# Patient Record
Sex: Female | Born: 1960
Health system: Southern US, Community
[De-identification: ages and names within clinical notes are randomized; demographics above are authoritative.]

## PROBLEM LIST (undated history)

## (undated) DIAGNOSIS — R600 Localized edema: Secondary | ICD-10-CM

## (undated) DIAGNOSIS — M1990 Unspecified osteoarthritis, unspecified site: Secondary | ICD-10-CM

## (undated) DIAGNOSIS — C801 Malignant (primary) neoplasm, unspecified: Secondary | ICD-10-CM

## (undated) DIAGNOSIS — R0609 Other forms of dyspnea: Secondary | ICD-10-CM

## (undated) DIAGNOSIS — D649 Anemia, unspecified: Secondary | ICD-10-CM

## (undated) DIAGNOSIS — L719 Rosacea, unspecified: Secondary | ICD-10-CM

## (undated) DIAGNOSIS — F32A Depression, unspecified: Secondary | ICD-10-CM

## (undated) DIAGNOSIS — U099 Post covid-19 condition, unspecified: Secondary | ICD-10-CM

## (undated) DIAGNOSIS — E2749 Other adrenocortical insufficiency: Secondary | ICD-10-CM

## (undated) DIAGNOSIS — I639 Cerebral infarction, unspecified: Secondary | ICD-10-CM

## (undated) DIAGNOSIS — J189 Pneumonia, unspecified organism: Secondary | ICD-10-CM

## (undated) DIAGNOSIS — E042 Nontoxic multinodular goiter: Secondary | ICD-10-CM

## (undated) DIAGNOSIS — F329 Major depressive disorder, single episode, unspecified: Secondary | ICD-10-CM

## (undated) DIAGNOSIS — Z87898 Personal history of other specified conditions: Secondary | ICD-10-CM

## (undated) DIAGNOSIS — R7303 Prediabetes: Secondary | ICD-10-CM

## (undated) DIAGNOSIS — G5600 Carpal tunnel syndrome, unspecified upper limb: Secondary | ICD-10-CM

## (undated) DIAGNOSIS — R0600 Dyspnea, unspecified: Secondary | ICD-10-CM

## (undated) DIAGNOSIS — R9389 Abnormal findings on diagnostic imaging of other specified body structures: Secondary | ICD-10-CM

## (undated) DIAGNOSIS — J449 Chronic obstructive pulmonary disease, unspecified: Secondary | ICD-10-CM

## (undated) DIAGNOSIS — N950 Postmenopausal bleeding: Secondary | ICD-10-CM

## (undated) DIAGNOSIS — N840 Polyp of corpus uteri: Secondary | ICD-10-CM

## (undated) DIAGNOSIS — M8580 Other specified disorders of bone density and structure, unspecified site: Secondary | ICD-10-CM

## (undated) DIAGNOSIS — L680 Hirsutism: Secondary | ICD-10-CM

## (undated) HISTORY — PX: MOUTH SURGERY: SHX715

## (undated) HISTORY — PX: OTHER SURGICAL HISTORY: SHX169

## (undated) HISTORY — DX: Malignant (primary) neoplasm, unspecified: C80.1

## (undated) HISTORY — PX: TUBAL LIGATION: SHX77

## (undated) HISTORY — PX: TONSILLECTOMY: SUR1361

## (undated) HISTORY — DX: Cerebral infarction, unspecified: I63.9

## (undated) HISTORY — PX: FOOT SURGERY: SHX648

## (undated) HISTORY — DX: Other adrenocortical insufficiency: E27.49

## (undated) HISTORY — PX: HERNIA REPAIR: SHX51

## (undated) HISTORY — DX: Other specified disorders of bone density and structure, unspecified site: M85.80

---
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
## 1991-11-03 DIAGNOSIS — Z8673 Personal history of transient ischemic attack (TIA), and cerebral infarction without residual deficits: Secondary | ICD-10-CM

## 1991-11-03 DIAGNOSIS — I639 Cerebral infarction, unspecified: Secondary | ICD-10-CM

## 1991-11-03 HISTORY — DX: Personal history of transient ischemic attack (TIA), and cerebral infarction without residual deficits: Z86.73

## 1991-11-03 HISTORY — DX: Cerebral infarction, unspecified: I63.9

## 1993-11-02 HISTORY — PX: INGUINAL HERNIA REPAIR: SUR1180

## 2013-11-02 DIAGNOSIS — C801 Malignant (primary) neoplasm, unspecified: Secondary | ICD-10-CM

## 2013-11-02 DIAGNOSIS — Z85819 Personal history of malignant neoplasm of unspecified site of lip, oral cavity, and pharynx: Secondary | ICD-10-CM

## 2013-11-02 HISTORY — DX: Personal history of malignant neoplasm of unspecified site of lip, oral cavity, and pharynx: Z85.819

## 2013-11-02 HISTORY — DX: Malignant (primary) neoplasm, unspecified: C80.1

## 2013-11-02 HISTORY — PX: MOUTH SURGERY: SHX715

## 2014-11-02 DIAGNOSIS — Z85828 Personal history of other malignant neoplasm of skin: Secondary | ICD-10-CM

## 2014-11-02 HISTORY — PX: MOHS SURGERY: SUR867

## 2014-11-02 HISTORY — DX: Other specified postprocedural states: Z85.828

## 2015-05-21 LAB — HM MAMMOGRAPHY

## 2015-11-27 ENCOUNTER — Encounter: Payer: Self-pay | Admitting: Pediatrics

## 2015-11-27 ENCOUNTER — Ambulatory Visit (INDEPENDENT_AMBULATORY_CARE_PROVIDER_SITE_OTHER): Admitting: Pediatrics

## 2015-11-27 ENCOUNTER — Other Ambulatory Visit: Payer: Self-pay | Admitting: Pediatrics

## 2015-11-27 VITALS — BP 131/86 | HR 67 | Temp 97.0°F | Ht 65.5 in | Wt 233.8 lb

## 2015-11-27 DIAGNOSIS — F329 Major depressive disorder, single episode, unspecified: Secondary | ICD-10-CM

## 2015-11-27 DIAGNOSIS — E538 Deficiency of other specified B group vitamins: Secondary | ICD-10-CM | POA: Diagnosis not present

## 2015-11-27 DIAGNOSIS — M7989 Other specified soft tissue disorders: Secondary | ICD-10-CM

## 2015-11-27 DIAGNOSIS — L719 Rosacea, unspecified: Secondary | ICD-10-CM

## 2015-11-27 DIAGNOSIS — F32A Depression, unspecified: Secondary | ICD-10-CM

## 2015-11-27 DIAGNOSIS — E041 Nontoxic single thyroid nodule: Secondary | ICD-10-CM | POA: Diagnosis not present

## 2015-11-27 MED ORDER — DOXYCYCLINE HYCLATE 50 MG PO TABS: 25.0000 mg | ORAL_TABLET | Freq: Every day | ORAL | Status: DC

## 2015-11-27 MED ORDER — VITAMIN B-12 1000 MCG PO TABS: 1000.0000 ug | ORAL_TABLET | Freq: Every day | ORAL | Status: DC

## 2015-11-27 NOTE — Telephone Encounter (Signed)
Refills sent

## 2015-11-27 NOTE — Progress Notes (Signed)
Subjective:    Patient ID: Miranda Perkins, female    DOB: Dec 26, 1960, 55 y.o.   MRN: [id number]  CC: f/u multiple med problems and est care  HPI: Miranda Perkins is a 55 y.o. female presenting for New Patient (Initial Visit)  Leg swelling: has had swelling off and on in LE for past two months, L >R. Has not had an ultrasound. Two sisters and an aunt with problems with clots in legs. 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 Karen Chafe did a biopsy in Nov 2015, wanted to do further biopsy vs intervention in Nov 2016. Pt wants referral to endocrine  Rosacea: takes '25mg'$  doxycycline once a day   Depression screen [facility] 2/9 11/27/2015  Decreased Interest 2  Down, Depressed, Hopeless 1  PHQ - 2 Score 3  Altered sleeping 3  Tired, decreased energy 0  Change in appetite 0  Feeling bad or failure about yourself  0  Trouble concentrating 0  Moving slowly or fidgety/restless 0  Suicidal thoughts 0  PHQ-9 Score 6     ROS: All systems negative other than what is in HPI   Past Medical History  Diagnosis Date  . Cancer ([place]) 2016    basal cell, forehead  . 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 Marital Status: Married    Spouse Name: N/A  . Number of Children: N/A  . Years of Education: N/A   Occupational History  . Not on file.   Social History Main Topics  . Smoking status: Former Smoker -- 0.50 packs/day    Types: Cigarettes    Quit date: 11/26/2009  . Smokeless tobacco: Not on file  . Alcohol Use: No  . Drug Use: No  . Sexual Activity: Not on file   Other Topics Concern  . Not on file   Social History Narrative  . No narrative on file    Family History  Problem Relation Age of Onset  . Rheum arthritis Sister   . Clotting disorder Sister   . Clotting disorder Sister   . Clotting disorder Maternal Aunt   . Clotting disorder Maternal Aunt   . Heart Problems Mother      Current Outpatient Prescriptions  Medication Sig Dispense Refill  . Doxycycline Hyclate 50 MG TABS Take 25 mg by mouth daily. 30 tablet 3  . ibandronate (BONIVA) 150 MG tablet     . ibuprofen (ADVIL,MOTRIN) 200 MG tablet Take 200 mg by mouth every 6 (six) hours as needed.    . vitamin B-12 (CYANOCOBALAMIN) 1000 MCG tablet Take 1 tablet (1,000 mcg total) by mouth daily. 30 tablet 1   No current facility-administered medications for this visit.       Objective:    BP 131/86 mmHg  Pulse 67  Temp(Src) 97 F (36.1 C) (Oral)  Ht 5' 5.5" (1.664 m)  Wt 233 lb 12.8 oz (106.051 kg)  BMI 38.30 kg/m2  Wt Readings from Last 3 Encounters:  11/27/15 233 lb 12.8 oz (106.051 kg)     Gen: NAD, alert, cooperative with exam, NCAT EYES: EOMI, no scleral injection or  icterus ENT:  TMs pearly gray b/l, OP without erythema LYMPH: no cervical LAD CV: NRRR, normal S1/S2, no murmur, distal pulses 2+ b/l Resp: CTABL, no wheezes, normal WOB Abd: +BS, soft, NTND. no guarding or organomegaly Ext: No edema, warm Neuro: Alert and oriented, strength equal b/l UE and LE, coordination grossly normal MSK: normal muscle bulk  R calf 50cm circ L calf 48.5     Assessment & Plan:    Miranda Perkins was seen today for f/u multiple med problems.  Diagnoses and all orders for this visit:  Depression Mood is ok, open to counseling, wants to get her grandsons into counseling as well. Not interested in medicine at this time. No thoughts of self harm. Gave list of counselors in the area.  Leg swelling L leg swelling for past couple of months, comes and goes, L calf always bigger at times with calf pain, fam hx of clotting disorder. WIll get duplex US -     CMP14+EGFR -      Thyroid Panel With TSH -     CBC -     US Venous Img Lower Bilateral; Future  Thyroid nodule H/o thyroid nodules, has had biopsy in past, was being followed by endocrine, wants to establish care. -     Thyroid Panel With TSH -     Ambulatory referral to Endocrinology  Vitamin B 12 deficiency Continue medicine -     vitamin B-12 (CYANOCOBALAMIN) 1000 MCG tablet; Take 1 tablet (1,000 mcg total) by mouth daily.  Rosacea Well controlled, continue medicine -     Doxycycline Hyclate 50 MG TABS; Take 25 mg by mouth daily.    Follow up plan: Return in about 4 weeks (around 12/25/2015).  Assunta Found, MD [facility] 11/27/2015, 12:24 PM

## 2015-11-27 NOTE — Telephone Encounter (Signed)
Patient aware rx sent to pharmacy.  

## 2015-11-28 DIAGNOSIS — L719 Rosacea, unspecified: Secondary | ICD-10-CM | POA: Insufficient documentation

## 2015-11-28 DIAGNOSIS — F329 Major depressive disorder, single episode, unspecified: Secondary | ICD-10-CM | POA: Insufficient documentation

## 2015-11-28 DIAGNOSIS — E042 Nontoxic multinodular goiter: Secondary | ICD-10-CM | POA: Insufficient documentation

## 2015-11-28 DIAGNOSIS — E538 Deficiency of other specified B group vitamins: Secondary | ICD-10-CM | POA: Insufficient documentation

## 2015-11-28 DIAGNOSIS — M7989 Other specified soft tissue disorders: Secondary | ICD-10-CM | POA: Insufficient documentation

## 2015-11-28 DIAGNOSIS — F32A Depression, unspecified: Secondary | ICD-10-CM | POA: Insufficient documentation

## 2015-11-28 LAB — CMP14+EGFR
A/G RATIO: 2.2 (ref 1.1–2.5)
ALT: 26 IU/L (ref 0–32)
AST: 15 IU/L (ref 0–40)
Albumin: 4.7 g/dL (ref 3.5–5.5)
Alkaline Phosphatase: 58 IU/L (ref 39–117)
BUN/Creatinine Ratio: 12 (ref 9–23)
BUN: 9 mg/dL (ref 6–24)
Bilirubin Total: 0.6 mg/dL (ref 0.0–1.2)
CALCIUM: 9.3 mg/dL (ref 8.7–10.2)
CO2: 23 mmol/L (ref 18–29)
CREATININE: 0.78 mg/dL (ref 0.57–1.00)
Chloride: 103 mmol/L (ref 96–106)
GFR, EST AFRICAN AMERICAN: 98 mL/min/{1.73_m2} (ref >59)
GFR, EST NON AFRICAN AMERICAN: 85 mL/min/{1.73_m2} (ref >59)
Globulin, Total: 2.1 g/dL (ref 1.5–4.5)
Glucose: 85 mg/dL (ref 65–99)
POTASSIUM: 4.3 mmol/L (ref 3.5–5.2)
Sodium: 143 mmol/L (ref 134–144)
TOTAL PROTEIN: 6.8 g/dL (ref 6.0–8.5)

## 2015-11-28 LAB — CBC
HEMOGLOBIN: 14.1 g/dL (ref 11.1–15.9)
Hematocrit: 42.3 % (ref 34.0–46.6)
MCH: 29.1 pg (ref 26.6–33.0)
MCHC: 33.3 g/dL (ref 31.5–35.7)
MCV: 87 fL (ref 79–97)
PLATELETS: 278 10*3/uL (ref 150–379)
RBC: 4.85 x10E6/uL (ref 3.77–5.28)
RDW: 14.3 % (ref 12.3–15.4)
WBC: 7 10*3/uL (ref 3.4–10.8)

## 2015-11-28 LAB — THYROID PANEL WITH TSH
FREE THYROXINE INDEX: 2.6 (ref 1.2–4.9)
T3 UPTAKE RATIO: 28 % (ref 24–39)
T4 TOTAL: 9.3 ug/dL (ref 4.5–12.0)
TSH: 2.23 u[IU]/mL (ref 0.450–4.500)

## 2015-11-29 ENCOUNTER — Telehealth: Payer: Self-pay | Admitting: Pediatrics

## 2015-11-29 ENCOUNTER — Encounter: Payer: Self-pay | Admitting: *Deleted

## 2015-11-29 ENCOUNTER — Ambulatory Visit (HOSPITAL_COMMUNITY)
Admission: RE | Admit: 2015-11-29 | Discharge: 2015-11-29 | Disposition: A | Discharge status: Home / Self Care | Source: Ambulatory Visit | Attending: Pediatrics | Admitting: Pediatrics

## 2015-11-29 DIAGNOSIS — M7989 Other specified soft tissue disorders: Secondary | ICD-10-CM | POA: Insufficient documentation

## 2015-11-29 DIAGNOSIS — L719 Rosacea, unspecified: Secondary | ICD-10-CM

## 2015-11-30 MED ORDER — DOXYCYCLINE HYCLATE 100 MG PO TABS
ORAL_TABLET | ORAL | Status: DC
Start: 2015-11-30 — End: 2016-06-12

## 2015-11-30 NOTE — Telephone Encounter (Signed)
Sent in new Rx for the doxycycline. If this doesn't work she should ask the pharmacist which brand/formulation of doxycycline is covered by her insurance and let me know and I will send that in.

## 2015-12-02 ENCOUNTER — Telehealth: Payer: Self-pay | Admitting: Pediatrics

## 2015-12-02 NOTE — Telephone Encounter (Signed)
Spoke with pharmacy and they will pay for the 100mg  and are only going to give her eight since she is going to be taking a 1/4 of a pill.

## 2015-12-02 NOTE — Telephone Encounter (Signed)
Yes, that was what I understood the pt said the pharmacy said needed to be prescribed in order for insurance to pay

## 2015-12-26 ENCOUNTER — Encounter: Payer: Self-pay | Admitting: Pediatrics

## 2015-12-26 ENCOUNTER — Ambulatory Visit (INDEPENDENT_AMBULATORY_CARE_PROVIDER_SITE_OTHER): Admitting: Pediatrics

## 2015-12-26 VITALS — BP 126/81 | HR 70 | Temp 98.1°F | Ht 65.5 in | Wt 231.6 lb

## 2015-12-26 DIAGNOSIS — M25461 Effusion, right knee: Secondary | ICD-10-CM

## 2015-12-26 NOTE — Progress Notes (Signed)
Subjective:    Patient ID: Miranda Perkins, female    DOB: 08/27/1961, 55 y.o.   MRN: [id number]  CC: Follow-up R knee pain  HPI: Miranda Perkins is a 55 y.o. female presenting for Follow-up  Knee pain: Ongoing since Oct Had an xray x2 within past 3 months that showed some OA changes,  Had a cortisone shot 2 months ago to knee, got sick following injection, knee got red, lasting about 4 days Did not help with pain Thinks she had an MRI as well that did not explain the knee pain When had cortisone shot, small amount of fluid pulled off but not sent for analysis and that helped slightly with immediate pain/pressure at the time  Now feels like her R knee is more swollen again compared with L, fluid behind her knee and going up lateral side of R thigh as well No fevers Knee is never warm or red No other joints involved     Depression screen [facility] 2/9 12/26/2015 11/27/2015  Decreased Interest 2 2  Down, Depressed, Hopeless 1 1  PHQ - 2 Score 3 3  Altered sleeping 3 3  Tired, decreased energy 0 0  Change in appetite 0 0  Feeling bad or failure about yourself  0 0  Trouble concentrating 0 0  Moving slowly or fidgety/restless 0 0  Suicidal thoughts 0 0  PHQ-9 Score 6 6     Relevant past medical, surgical, family and social history reviewed and updated as indicated. Interim medical history since our last visit reviewed. Allergies and medications reviewed and updated.    ROS: Per HPI unless specifically indicated above  History  Smoking status  . Former Smoker -- 0.50 packs/day  . Types: Cigarettes  . Quit date: 11/26/2009  Smokeless tobacco  . Not on file    Past Medical History Patient Active Problem List   Diagnosis Date Noted  . Depression 11/28/2015  . Leg swelling 11/28/2015  . Thyroid nodule 11/28/2015  . Vitamin B 12 deficiency 11/28/2015  . Rosacea 11/28/2015        Objective:    BP 126/81 mmHg  Pulse 70  Temp(Src) 98.1 F (36.7 C) (Oral)   Ht 5' 5.5" (1.664 m)  Wt 231 lb 9.6 oz (105.053 kg)  BMI 37.94 kg/m2  Wt Readings from Last 3 Encounters:  12/26/15 231 lb 9.6 oz (105.053 kg)  11/27/15 233 lb 12.8 oz (106.051 kg)    Gen: NAD, alert, cooperative with exam, NCAT EYES: EOMI, no scleral injection or icterus ENT:  OP without erythema CV: NRRR, normal S1/S2, no murmur, distal pulses 2+ b/l Resp: CTABL, no wheezes, normal WOB Neuro: Alert and oriented, strength equal b/l UE and LE, coordination grossly normal MSK: Knee: R knee with moderate to large effusion compared with L, no redness or warmth, small fluid collection behind R knee, no obvious bony abnormalities.  Some TTP throughout Non painful patellar compression. Patellar glide without crepitus. Patellar and quadriceps tendons unremarkable. Hamstring and quadriceps strength is normal.       Assessment & Plan:    Miranda Perkins was seen today for follow-up R knee and LE pain, does have effusion, no redness or warmth. Attempted R knee aspiration, dry tap.  Diagnoses and all orders for this visit:  Knee effusion, right -     Ambulatory referral to Orthopedic Surgery -     [place] Extrem Low Right Ltd; Future  PROCEDURE: Verbal consent was obtained from the patient. Risks including infection explained and  contrasted with benefits and alternatives. Patient prepped with betadine. Landmarks identified. First using medial approach, then lateral approach attempted knee aspiration with 22 gauge needle. No return of fluid. Pt tolerated procedure well.  Follow up plan: Return in about 4 weeks (around 01/23/2016).  Assunta Found, MD [facility] 12/26/2015, 10:20 AM

## 2016-01-02 ENCOUNTER — Ambulatory Visit (HOSPITAL_COMMUNITY)
Admission: RE | Admit: 2016-01-02 | Discharge: 2016-01-02 | Disposition: A | Discharge status: Home / Self Care | Source: Ambulatory Visit | Attending: Pediatrics | Admitting: Pediatrics

## 2016-01-02 DIAGNOSIS — M7121 Synovial cyst of popliteal space [Baker], right knee: Secondary | ICD-10-CM | POA: Diagnosis not present

## 2016-01-02 DIAGNOSIS — M25461 Effusion, right knee: Secondary | ICD-10-CM | POA: Insufficient documentation

## 2016-01-13 ENCOUNTER — Ambulatory Visit (INDEPENDENT_AMBULATORY_CARE_PROVIDER_SITE_OTHER): Admitting: "Endocrinology

## 2016-01-13 ENCOUNTER — Encounter: Payer: Self-pay | Admitting: "Endocrinology

## 2016-01-13 VITALS — BP 135/81 | HR 78 | Ht 65.5 in | Wt 233.0 lb

## 2016-01-13 DIAGNOSIS — E042 Nontoxic multinodular goiter: Secondary | ICD-10-CM | POA: Diagnosis not present

## 2016-01-13 NOTE — Progress Notes (Signed)
Subjective:    Patient ID: Miranda Perkins, female    DOB: 1960-12-07, PCP Eustaquio Maize, MD   Past Medical History  Diagnosis Date  . Cancer ([place]) 2016    basal cell, forehead  . 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 Leg surgery  1972  . Tubal ligation    . Hernia repair    . Mouth surgery    . Skin cancer removal     Social History   Social History  . Marital Status: Married    Spouse Name: N/A  . Number of Children: N/A  . Years of Education: N/A   Social History Main Topics  . Smoking status: Former Smoker -- 0.50 packs/day    Types: Cigarettes    Quit date: 11/26/2009  . Smokeless tobacco: None  . Alcohol Use: No  . Drug Use: No  . Sexual Activity: Not Asked   Other Topics Concern  . None   Social History Narrative   Outpatient Encounter Prescriptions as of 01/13/2016  Medication Sig  . ibandronate (BONIVA) 150 MG tablet   . ibuprofen (ADVIL,MOTRIN) 200 MG tablet Take 200 mg by mouth every 6 (six) hours as needed.  . vitamin B-12 (CYANOCOBALAMIN) 1000 MCG tablet Take 1 tablet (1,000 mcg total) by mouth daily.  . vitamin C (ASCORBIC ACID) 500 MG tablet Take 500 mg by mouth daily.  . clobetasol cream (TEMOVATE) AB-123456789 % Apply 1 application topically 2 (two) times daily.  [name] doxycycline (VIBRA-TABS) 100 MG tablet Take 25mg  daily (Patient not taking: Reported on 12/26/2015)   No facility-administered encounter medications on file as of 01/13/2016.   ALLERGIES: Allergies  Allergen Reactions  . Penicillins Anaphylaxis  . Cortisone Hives   VACCINATION STATUS:  There is no immunization history on file for this patient.  HPI 55 yr old female with above medical hx. She is being seen in consultation for hx of MNG requested by Dr. Eustaquio Maize. She relates that she was known to have [place] for "years "and has been following with endocrinologist  in [place]. She underwent FNA of thyroid nodule 2 years ago. Reports are not available. She is not on thyroid hormones nor is she on antithyroid meds. She denies exposure to neck radiation. She denies dysphagia, SOB, voice change. She denies family hx of thyroid cancer.  Review of Systems Constitutional: no weight gain/loss, no fatigue, no subjective hyperthermia/hypothermia Eyes: no blurry vision, no xerophthalmia ENT: no sore throat, no nodules palpated in throat, no dysphagia/odynophagia, no hoarseness Cardiovascular: no CP/SOB/palpitations/leg swelling Respiratory: no cough/SOB Gastrointestinal: no N/V/D/C Musculoskeletal: no muscle/joint aches Skin: no rashes Neurological: no tremors/numbness/tingling/dizziness Psychiatric: no depression/anxiety  Objective:    BP 135/81 mmHg  Pulse 78  Ht 5' 5.5" (1.664 m)  Wt 233 lb (105.688 kg)  BMI 38.17 kg/m2  SpO2 96%  Wt Readings from Last 3 Encounters:  01/13/16 233 lb (105.688 kg)  12/26/15 231 lb 9.6 oz (105.053 kg)  11/27/15 233 lb 12.8 oz (106.051 kg)    Physical Exam Constitutional: overweight, in NAD Eyes: PERRLA, EOMI, no exophthalmos ENT: moist mucous membranes, no thyromegaly, no cervical lymphadenopathy Cardiovascular: RRR, No MRG Respiratory: CTA B Gastrointestinal: abdomen soft, NT, ND, BS+ Musculoskeletal: no deformities, strength intact in all 4 Skin: moist, warm, no rashes Neurological: no tremor with outstretched hands, DTR normal in  all 4   CMP     Component Value Date/Time   NA 143 11/27/2015 1259   K 4.3 11/27/2015 1259   CL 103 11/27/2015 1259   CO2 23 11/27/2015 1259   GLUCOSE 85 11/27/2015 1259   BUN 9 11/27/2015 1259   CREATININE 0.78 11/27/2015 1259   CALCIUM 9.3 11/27/2015 1259   PROT 6.8 11/27/2015 1259   ALBUMIN 4.7 11/27/2015 1259   AST 15 11/27/2015 1259   ALT 26 11/27/2015 1259   ALKPHOS 58 11/27/2015 1259   BILITOT 0.6 11/27/2015 1259   GFRNONAA 85 11/27/2015 1259   GFRAA 98  11/27/2015 1259    Thyroid ultrasound from Mar 27, 2015 from [place] was reviewed . Largest nodule on right lobe 1.7 cms .     Assessment & Plan:   1. Multiple thyroid nodules/Nontoxic MNG  -I have reviewed her available records on thyroid. she has hx of nontoxic MNG. She is clinically euthyroid. I will proceed to request FNA results . If benign no intervention needed until repeat thyroid ultrasound in 1-2 years time. She will RTN in 3 weeks.  - I advised patient to maintain close follow up with Eustaquio Maize, MD for primary care needs. Follow up plan: No Follow-up on file.  Glade Lloyd, MD Phone: [phone]  Fax: [phone]   01/13/2016, 7:50 PM

## 2016-01-23 ENCOUNTER — Ambulatory Visit: Admitting: Pediatrics

## 2016-01-24 ENCOUNTER — Ambulatory Visit: Admitting: Pediatrics

## 2016-02-03 ENCOUNTER — Encounter: Payer: Self-pay | Admitting: "Endocrinology

## 2016-02-03 ENCOUNTER — Ambulatory Visit (INDEPENDENT_AMBULATORY_CARE_PROVIDER_SITE_OTHER): Admitting: "Endocrinology

## 2016-02-03 VITALS — BP 122/84 | HR 64 | Ht 65.5 in | Wt 230.0 lb

## 2016-02-03 DIAGNOSIS — E042 Nontoxic multinodular goiter: Secondary | ICD-10-CM

## 2016-02-03 NOTE — Progress Notes (Signed)
Subjective:    Patient ID: Miranda Perkins, female    DOB: [birth year], PCP Eustaquio Maize, MD   Past Medical History  Diagnosis Date  . Cancer ([place]) 2016    basal cell, forehead  . 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 Leg surgery  1972  . Tubal ligation    . Hernia repair    . Mouth surgery    . Skin cancer removal     Social History   Social History  . Marital Status: Married    Spouse Name: N/A  . Number of Children: N/A  . Years of Education: N/A   Social History Main Topics  . Smoking status: Former Smoker -- 0.50 packs/day    Types: Cigarettes    Quit date: 11/26/2009  . Smokeless tobacco: None  . Alcohol Use: No  . Drug Use: No  . Sexual Activity: Not Asked   Other Topics Concern  . None   Social History Narrative   Outpatient Encounter Prescriptions as of 02/03/2016  Medication Sig  . clobetasol cream (TEMOVATE) AB-123456789 % Apply 1 application topically 2 (two) times daily.  [name] doxycycline (VIBRA-TABS) 100 MG tablet Take 25mg  daily (Patient not taking: Reported on 12/26/2015)  . ibandronate (BONIVA) 150 MG tablet   . ibuprofen (ADVIL,MOTRIN) 200 MG tablet Take 200 mg by mouth every 6 (six) hours as needed.  . vitamin B-12 (CYANOCOBALAMIN) 1000 MCG tablet Take 1 tablet (1,000 mcg total) by mouth daily.  . vitamin C (ASCORBIC ACID) 500 MG tablet Take 500 mg by mouth daily.   No facility-administered encounter medications on file as of 02/03/2016.   ALLERGIES: Allergies  Allergen Reactions  . Penicillins Anaphylaxis  . Cortisone Hives   VACCINATION STATUS:  There is no immunization history on file for this patient.  HPI 55 yr old female with above medical hx. She is Here to follow-up for her history of multinodular goiter requested by Dr. Eustaquio Maize. -I have received her records from [facility] on her history of  multinodular goiter.  -Ultrasound reports from 2015 and 16 will be scanned in her records.  -Fine-needle aspiration from November 2015 showed benign nodular goiter bethesda system category  II with no cytologic evidence of malignancy.   She is not on thyroid hormones nor is she on antithyroid meds. She denies exposure to neck radiation. She denies dysphagia, SOB, voice change. She denies family hx of thyroid cancer.  Review of Systems Constitutional: no weight gain/loss, no fatigue, no subjective hyperthermia/hypothermia Eyes: no blurry vision, no xerophthalmia ENT: no sore throat, no nodules palpated in throat, no dysphagia/odynophagia, no hoarseness Cardiovascular: no CP/SOB/palpitations/leg swelling Respiratory: no cough/SOB Gastrointestinal: no N/V/D/C Musculoskeletal: no muscle/joint aches Skin: no rashes Neurological: no tremors/numbness/tingling/dizziness Psychiatric: no depression/anxiety  Objective:    BP 122/84 mmHg  Pulse 64  Ht 5' 5.5" (1.664 m)  Wt 230 lb (104.327 kg)  BMI 37.68 kg/m2  SpO2 95%  Wt Readings from Last 3 Encounters:  02/03/16 230 lb (104.327 kg)  01/13/16 233 lb (105.688 kg)  12/26/15 231 lb 9.6 oz (105.053 kg)    Physical Exam Constitutional: overweight, in NAD Eyes: PERRLA, EOMI, no exophthalmos ENT: moist mucous membranes, no thyromegaly, no cervical lymphadenopathy Cardiovascular: RRR, No MRG Respiratory: CTA B Gastrointestinal: abdomen soft, NT, ND, BS+ Musculoskeletal: no deformities, strength  intact in all 4 Skin: moist, warm, no rashes Neurological: no tremor with outstretched hands, DTR normal in all 4   CMP     Component Value Date/Time   NA 143 11/27/2015 1259   K 4.3 11/27/2015 1259   CL 103 11/27/2015 1259   CO2 23 11/27/2015 1259   GLUCOSE 85 11/27/2015 1259   BUN 9 11/27/2015 1259   CREATININE 0.78 11/27/2015 1259   CALCIUM 9.3 11/27/2015 1259   PROT 6.8 11/27/2015 1259   ALBUMIN 4.7 11/27/2015 1259   AST 15  11/27/2015 1259   ALT 26 11/27/2015 1259   ALKPHOS 58 11/27/2015 1259   BILITOT 0.6 11/27/2015 1259   GFRNONAA 85 11/27/2015 1259   GFRAA 98 11/27/2015 1259    Thyroid ultrasound from Mar 27, 2015 from [place] was reviewed . Largest nodule on right lobe 1.7 cms .   -09/26/2014 thyroid ultrasound showed 1.6 cm nodule on the right lobe of the thyroid Thyroid ultrasound from May 2016 showed to nodules on the right lobe nodule #10.7 cm nodular #21.7 cm, small subcentimeter nodules on the right lobe as well as Fine-needle aspiration on 09/28/2014 on her right lobe nodule showed abundant normal follicular cells and a few clusters of intact normal follicles, a small component of lymphocytes mixed with isolated Hurthle cells.  Assessment & Plan:   1. Multiple thyroid nodules/Nontoxic MNG  -I have reviewed her available records on thyroid. she has hx of nontoxic MNG.  -She did have fine-needle aspiration off at least one of the nodules on the right lobe showing benign findings. Given the fact that they're where her to systems and lymphocytes in the biopsy sample, she would need a follow-up with repeat thyroid imaging. I plan to do this a year after her last thyroid ultrasound which would be in May 2017. If nodules are not changing or not showing significant change in size periodic imaging and follow-up will be in the long-term plan. If nodules are growing significantly she may need repeat fine needle aspiration. She is clinically euthyroid.  - I advised patient to maintain close follow up with Eustaquio Maize, MD for primary care needs. Follow up plan: Return in about 8 weeks (around 03/30/2016) for Thyroid Ultrasound.  Glade Lloyd, MD Phone: [phone]  Fax: [phone]   02/03/2016, 11:24 AM

## 2016-02-26 ENCOUNTER — Ambulatory Visit: Admitting: Family Medicine

## 2016-03-26 ENCOUNTER — Ambulatory Visit (HOSPITAL_COMMUNITY)
Admission: RE | Admit: 2016-03-26 | Discharge: 2016-03-26 | Disposition: A | Discharge status: Home / Self Care | Source: Ambulatory Visit | Attending: "Endocrinology | Admitting: "Endocrinology

## 2016-03-26 DIAGNOSIS — E042 Nontoxic multinodular goiter: Secondary | ICD-10-CM | POA: Diagnosis not present

## 2016-03-27 ENCOUNTER — Ambulatory Visit (HOSPITAL_COMMUNITY)

## 2016-04-01 ENCOUNTER — Encounter: Payer: Self-pay | Admitting: "Endocrinology

## 2016-04-01 ENCOUNTER — Ambulatory Visit (INDEPENDENT_AMBULATORY_CARE_PROVIDER_SITE_OTHER): Admitting: "Endocrinology

## 2016-04-01 VITALS — BP 134/83 | HR 78 | Ht 65.5 in | Wt 232.0 lb

## 2016-04-01 DIAGNOSIS — E042 Nontoxic multinodular goiter: Secondary | ICD-10-CM

## 2016-04-01 NOTE — Progress Notes (Signed)
Subjective:    Patient ID: Miranda Perkins, female    DOB: 1960/12/12, PCP Eustaquio Maize, MD   Past Medical History  Diagnosis Date  . Cancer ([place]) 2016    basal cell, forehead  . 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 Leg surgery  1972  . Tubal ligation    . Hernia repair    . Mouth surgery    . Skin cancer removal     Social History   Social History  . Marital Status: Married    Spouse Name: N/A  . Number of Children: N/A  . Years of Education: N/A   Social History Main Topics  . Smoking status: Former Smoker -- 0.50 packs/day    Types: Cigarettes    Quit date: 11/26/2009  . Smokeless tobacco: None  . Alcohol Use: No  . Drug Use: No  . Sexual Activity: Not Asked   Other Topics Concern  . None   Social History Narrative   Outpatient Encounter Prescriptions as of 04/01/2016  Medication Sig  . clobetasol cream (TEMOVATE) AB-123456789 % Apply 1 application topically 2 (two) times daily.  [name] doxycycline (VIBRA-TABS) 100 MG tablet Take 25mg  daily (Patient not taking: Reported on 12/26/2015)  . ibandronate (BONIVA) 150 MG tablet   . ibuprofen (ADVIL,MOTRIN) 200 MG tablet Take 200 mg by mouth every 6 (six) hours as needed.  . vitamin B-12 (CYANOCOBALAMIN) 1000 MCG tablet Take 1 tablet (1,000 mcg total) by mouth daily.  . vitamin C (ASCORBIC ACID) 500 MG tablet Take 500 mg by mouth daily.   No facility-administered encounter medications on file as of 04/01/2016.   ALLERGIES: Allergies  Allergen Reactions  . Penicillins Anaphylaxis  . Cortisone Hives   VACCINATION STATUS:  There is no immunization history on file for this patient.  HPI 55 yr old female with above medical hx. She is Here to follow-up for her history of multinodular goiter With repeat thyroid /neck ultrasound.  -Her repeat thyroid ultrasound is largely unchanged from her prior studies-  Showing 1.7 and 1.1 cm nodules on the right lobe of the thyroid. -Ultrasound reports from 2015 and 16 have been  scanned in her records.  -Fine-needle aspiration from November 2015 in [place]  showed benign nodular goiter bethesda system category  II with no cytologic evidence of malignancy.   She is not on thyroid hormones nor is she on antithyroid meds. She denies exposure to neck radiation. She denies dysphagia, SOB, voice change. She denies family hx of thyroid cancer.  Review of Systems Constitutional: no weight gain/loss, no fatigue, no subjective hyperthermia/hypothermia Eyes: no blurry vision, no xerophthalmia ENT: no sore throat, no nodules palpated in throat, no dysphagia/odynophagia, no hoarseness Cardiovascular: no CP/SOB/palpitations/leg swelling Respiratory: no cough/SOB Gastrointestinal: no N/V/D/C Musculoskeletal: no muscle/joint aches Skin: no rashes Neurological: no tremors/numbness/tingling/dizziness Psychiatric: no depression/anxiety  Objective:    BP 134/83 mmHg  Pulse 78  Ht 5' 5.5" (1.664 m)  Wt 232 lb (105.235 kg)  BMI 38.01 kg/m2  SpO2 98%  Wt Readings from Last 3 Encounters:  04/01/16 232 lb (105.235 kg)  02/03/16 230 lb (104.327 kg)  01/13/16 233 lb (105.688 kg)    Physical Exam Constitutional: overweight, in NAD Eyes: PERRLA, EOMI, no exophthalmos ENT: moist mucous membranes, no thyromegaly, no cervical lymphadenopathy Cardiovascular: RRR, No MRG Respiratory:  CTA B Gastrointestinal: abdomen soft, NT, ND, BS+ Musculoskeletal: no deformities, strength intact in all 4 Skin: moist, warm, no rashes Neurological: no tremor with outstretched hands, DTR normal in all 4  Labs from 11/27/2015 showed TSH and free T4 within normal range.  Thyroid ultrasound from Mar 27, 2015 from [place] was reviewed . Largest nodule on right lobe 1.7 cms .   Her repeat thyroid ultrasound on 03/26/2016 at [facility] radiology showed  unchanged sonogram of the thyroid with 1.7 and 1.1 cm nodules.  Fine-needle aspiration on 09/28/2014 on her right lobe nodule   in [facility] which showed abundant normal follicular cells and a few clusters of intact normal follicles, a small component of lymphocytes mixed with isolated Hurthle cells.  Assessment & Plan:   1. Multiple thyroid nodules/Nontoxic MNG  -I have reviewed her available records on thyroid. she has hx of   Benign nontoxic MNG.  She would not need antithyroid intervention for now. She is clinically euthyroid.  -She will have repeat thyroid function test in 1 year. At that point if clinically indicated she will be considered for repeat thyroid/neck ultrasound. - I advised patient to maintain close follow up with Eustaquio Maize, MD for primary care needs. Follow up plan: Return in about 1 year (around 04/01/2017) for follow up with pre-visit labs.  Glade Lloyd, MD Phone: [phone]  Fax: [phone]   04/01/2016, 10:57 AM

## 2016-04-09 ENCOUNTER — Encounter: Payer: Self-pay | Admitting: Family Medicine

## 2016-04-09 ENCOUNTER — Ambulatory Visit (HOSPITAL_COMMUNITY)
Admission: RE | Admit: 2016-04-09 | Discharge: 2016-04-09 | Disposition: A | Discharge status: Home / Self Care | Source: Ambulatory Visit | Attending: Family Medicine | Admitting: Family Medicine

## 2016-04-09 ENCOUNTER — Ambulatory Visit (INDEPENDENT_AMBULATORY_CARE_PROVIDER_SITE_OTHER): Admitting: Family Medicine

## 2016-04-09 VITALS — BP 118/81 | HR 72 | Temp 97.1°F | Ht 65.5 in | Wt 229.6 lb

## 2016-04-09 DIAGNOSIS — G44319 Acute post-traumatic headache, not intractable: Secondary | ICD-10-CM | POA: Diagnosis not present

## 2016-04-09 DIAGNOSIS — S060X0A Concussion without loss of consciousness, initial encounter: Secondary | ICD-10-CM
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
## 2016-04-09 NOTE — Patient Instructions (Addendum)
Great to meet you!  We will arrange your CAT scan and call with results as soon as we have them. If they see serious findings they will call me before letting you go.   It sounds like you have a concussion causing a headache, give your brain a rest for 2-3 days  Try tylenol for the headache  Concussion, Adult A concussion, or closed-head injury, is a brain injury caused by a direct blow to the head or by a quick and sudden movement (jolt) of the head or neck. Concussions are usually not life-threatening. Even so, the effects of a concussion can be serious. If you have had a concussion before, you are more likely to experience concussion-like symptoms after a direct blow to the head.  CAUSES  Direct blow to the head, such as from running into another player during a soccer game, being hit in a fight, or hitting your head on a hard surface.  A jolt of the head or neck that causes the brain to move back and forth inside the skull, such as in a car crash. SIGNS AND SYMPTOMS The signs of a concussion can be hard to notice. Early on, they may be missed by you, family members, and health care providers. You may look fine but act or feel differently. Symptoms are usually temporary, but they may last for days, weeks, or even longer. Some symptoms may appear right away while others may not show up for hours or days. Every head injury is different. Symptoms include:  Mild to moderate headaches that will not go away.  A feeling of pressure inside your head.  Having more trouble than usual:  Learning or remembering things you have heard.  Answering questions.  Paying attention or concentrating.  Organizing daily tasks.  Making decisions and solving problems.  Slowness in thinking, acting or reacting, speaking, or reading.  Getting lost or being easily confused.  Feeling tired all the time or lacking energy (fatigued).  Feeling drowsy.  Sleep disturbances.  Sleeping more than  usual.  Sleeping less than usual.  Trouble falling asleep.  Trouble sleeping (insomnia).  Loss of balance or feeling lightheaded or dizzy.  Nausea or vomiting.  Numbness or tingling.  Increased sensitivity to:  Sounds.  Lights.  Distractions.  Vision problems or eyes that tire easily.  Diminished sense of taste or smell.  Ringing in the ears.  Mood changes such as feeling sad or anxious.  Becoming easily irritated or angry for little or no reason.  Lack of motivation.  Seeing or hearing things other people do not see or hear (hallucinations). DIAGNOSIS Your health care provider can usually diagnose a concussion based on a description of your injury and symptoms. He or she will ask whether you passed out (lost consciousness) and whether you are having trouble remembering events that happened right before and during your injury. Your evaluation might include:  A brain scan to look for signs of injury to the brain. Even if the test shows no injury, you may still have a concussion.  Blood tests to be sure other problems are not present. TREATMENT  Concussions are usually treated in an emergency department, in urgent care, or at a clinic. You may need to stay in the hospital overnight for further treatment.  Tell your health care provider if you are taking any medicines, including prescription medicines, over-the-counter medicines, and natural remedies. Some medicines, such as blood thinners (anticoagulants) and aspirin, may increase the chance of complications. Also tell  your health care provider whether you have had alcohol or are taking illegal drugs. This information may affect treatment.  Your health care provider will send you home with important instructions to follow.  How fast you will recover from a concussion depends on many factors. These factors include how severe your concussion is, what part of your brain was injured, your age, and how healthy you were  before the concussion.  Most people with mild injuries recover fully. Recovery can take time. In general, recovery is slower in older persons. Also, persons who have had a concussion in the past or have other medical problems may find that it takes longer to recover from their current injury. HOME CARE INSTRUCTIONS General Instructions  Carefully follow the directions your health care provider gave you.  Only take over-the-counter or prescription medicines for pain, discomfort, or fever as directed by your health care provider.  Take only those medicines that your health care provider has approved.  Do not drink alcohol until your health care provider says you are well enough to do so. Alcohol and certain other drugs may slow your recovery and can put you at risk of further injury.  If it is harder than usual to remember things, write them down.  If you are easily distracted, try to do one thing at a time. For example, do not try to watch TV while fixing dinner.  Talk with family members or close friends when making important decisions.  Keep all follow-up appointments. Repeated evaluation of your symptoms is recommended for your recovery.  Watch your symptoms and tell others to do the same. Complications sometimes occur after a concussion. Older adults with a brain injury may have a higher risk of serious complications, such as a blood clot on the brain.  Tell your teachers, school nurse, school counselor, coach, athletic trainer, or work Freight forwarder about your injury, symptoms, and restrictions. Tell them about what you can or cannot do. They should watch for:  Increased problems with attention or concentration.  Increased difficulty remembering or learning new information.  Increased time needed to complete tasks or assignments.  Increased irritability or decreased ability to cope with stress.  Increased symptoms.  Rest. Rest helps the brain to heal. Make sure you:  Get plenty of  sleep at night. Avoid staying up late at night.  Keep the same bedtime hours on weekends and weekdays.  Rest during the day. Take daytime naps or rest breaks when you feel tired.  Limit activities that require a lot of thought or concentration. These include:  Doing homework or job-related work.  Watching TV.  Working on the computer.  Avoid any situation where there is potential for another head injury (football, hockey, soccer, basketball, martial arts, downhill snow sports and horseback riding). Your condition will get worse every time you experience a concussion. You should avoid these activities until you are evaluated by the appropriate follow-up health care providers. Returning To Your Regular Activities You will need to return to your normal activities slowly, not all at once. You must give your body and brain enough time for recovery.  Do not return to sports or other athletic activities until your health care provider tells you it is safe to do so.  Ask your health care provider when you can drive, ride a bicycle, or operate heavy machinery. Your ability to react may be slower after a brain injury. Never do these activities if you are dizzy.  Ask your health care provider about  when you can return to work or school. Preventing Another Concussion It is very important to avoid another brain injury, especially before you have recovered. In rare cases, another injury can lead to permanent brain damage, brain swelling, or death. The risk of this is greatest during the first 7-10 days after a head injury. Avoid injuries by:  Wearing a seat belt when riding in a car.  Drinking alcohol only in moderation.  Wearing a helmet when biking, skiing, skateboarding, skating, or doing similar activities.  Avoiding activities that could lead to a second concussion, such as contact or recreational sports, until your health care provider says it is okay.  Taking safety measures in your  home.  Remove clutter and tripping hazards from floors and stairways.  Use grab bars in bathrooms and handrails by stairs.  Place non-slip mats on floors and in bathtubs.  Improve lighting in dim areas. SEEK MEDICAL CARE IF:  You have increased problems paying attention or concentrating.  You have increased difficulty remembering or learning new information.  You need more time to complete tasks or assignments than before.  You have increased irritability or decreased ability to cope with stress.  You have more symptoms than before. Seek medical care if you have any of the following symptoms for more than 2 weeks after your injury:  Lasting (chronic) headaches.  Dizziness or balance problems.  Nausea.  Vision problems.  Increased sensitivity to noise or light.  Depression or mood swings.  Anxiety or irritability.  Memory problems.  Difficulty concentrating or paying attention.  Sleep problems.  Feeling tired all the time. SEEK IMMEDIATE MEDICAL CARE IF:  You have severe or worsening headaches. These may be a sign of a blood clot in the brain.  You have weakness (even if only in one hand, leg, or part of the face).  You have numbness.  You have decreased coordination.  You vomit repeatedly.  You have increased sleepiness.  One pupil is larger than the other.  You have convulsions.  You have slurred speech.  You have increased confusion. This may be a sign of a blood clot in the brain.  You have increased restlessness, agitation, or irritability.  You are unable to recognize people or places.  You have neck pain.  It is difficult to wake you up.  You have unusual behavior changes.  You lose consciousness. MAKE SURE YOU:  Understand these instructions.  Will watch your condition.  Will get help right away if you are not doing well or get worse.   This information is not intended to replace advice given to you by your health care  provider. Make sure you discuss any questions you have with your health care provider.   Document Released: 01/09/2004 Document Revised: 11/09/2014 Document Reviewed: 05/11/2013 Elsevier Interactive Patient Education [employer].

## 2016-04-15 ENCOUNTER — Telehealth: Payer: Self-pay | Admitting: Pediatrics

## 2016-04-15 NOTE — Telephone Encounter (Signed)
Spoke to pt

## 2016-04-30 ENCOUNTER — Telehealth: Payer: Self-pay | Admitting: Pediatrics

## 2016-04-30 DIAGNOSIS — F0781 Postconcussional syndrome: Secondary | ICD-10-CM | POA: Insufficient documentation

## 2016-04-30 MED ORDER — AMITRIPTYLINE HCL 25 MG PO TABS: 25.0000 mg | ORAL_TABLET | Freq: Every day | ORAL | Status: DC

## 2016-04-30 NOTE — Telephone Encounter (Signed)
Called and discussed with the patient.  She is continuing to have some mild dizziness and nausea that comes on with it. She also has mild headaches.  She states that her symptoms overall are improved, but she states that probably 40% of the day she has some sort of symptoms.  She is trying to indomethacin which was prescribed by her orthopedist.  I recommended trying Elavil. She does not have any suicidal thoughts, she has mild depression which is related to the anniversary of her daughter's death 2 years ago on 05-24-2023.   Since she is having such frequent symptoms I have recommended that she be seen by neurologist, I placed a referral for this.  Laroy Apple, MD [facility] 04/30/2016, 5:18 PM

## 2016-04-30 NOTE — Telephone Encounter (Signed)
Patient states that she is still experiencing dizziness and nauseated at times.She wants to know is there anything she can take to help with her symptoms?  She said that Dr. Cay Schillings put her in Indomethacin and just wanted [place] to know this.

## 2016-05-06 ENCOUNTER — Encounter: Payer: Self-pay | Admitting: *Deleted

## 2016-06-12 ENCOUNTER — Encounter: Payer: Self-pay | Admitting: Neurology

## 2016-06-12 ENCOUNTER — Ambulatory Visit (INDEPENDENT_AMBULATORY_CARE_PROVIDER_SITE_OTHER): Admitting: Neurology

## 2016-06-12 VITALS — BP 108/60 | HR 91 | Ht 64.0 in | Wt 224.0 lb

## 2016-06-12 DIAGNOSIS — F0781 Postconcussional syndrome: Secondary | ICD-10-CM | POA: Diagnosis not present
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
## 2016-06-23 ENCOUNTER — Other Ambulatory Visit: Payer: Self-pay | Admitting: *Deleted

## 2016-06-23 MED ORDER — IBANDRONATE SODIUM 150 MG PO TABS: 150.0000 mg | ORAL_TABLET | ORAL | 0 refills | Status: DC

## 2016-06-25 ENCOUNTER — Other Ambulatory Visit: Payer: Self-pay | Admitting: Family Medicine

## 2016-06-26 MED ORDER — IBANDRONATE SODIUM 150 MG PO TABS: 150.0000 mg | ORAL_TABLET | ORAL | 0 refills | Status: DC

## 2016-06-26 NOTE — Telephone Encounter (Signed)
Rx sent to the pharmacy and pt is aware and pt will call back to schedule routine follow up with Dr.Vincent.

## 2016-07-15 ENCOUNTER — Encounter: Payer: Self-pay | Admitting: Pediatrics

## 2016-07-15 ENCOUNTER — Ambulatory Visit (INDEPENDENT_AMBULATORY_CARE_PROVIDER_SITE_OTHER): Admitting: Pediatrics

## 2016-07-15 VITALS — BP 136/87 | HR 83 | Temp 97.3°F | Ht 64.0 in | Wt 230.8 lb

## 2016-07-15 DIAGNOSIS — R202 Paresthesia of skin: Secondary | ICD-10-CM

## 2016-07-15 DIAGNOSIS — F0781 Postconcussional syndrome: Secondary | ICD-10-CM

## 2016-07-15 DIAGNOSIS — F32A Depression, unspecified: Secondary | ICD-10-CM

## 2016-07-15 DIAGNOSIS — F329 Major depressive disorder, single episode, unspecified: Secondary | ICD-10-CM | POA: Diagnosis not present

## 2016-07-15 MED ORDER — CITALOPRAM HYDROBROMIDE 20 MG PO TABS: 20.0000 mg | ORAL_TABLET | Freq: Every day | ORAL | 1 refills | Status: DC

## 2016-07-15 NOTE — Patient Instructions (Signed)
Take half tab of citalopram for 8 days, then full tab I ordered the nerve conduction test
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
## 2016-09-14 ENCOUNTER — Ambulatory Visit (INDEPENDENT_AMBULATORY_CARE_PROVIDER_SITE_OTHER): Admitting: Pediatrics

## 2016-09-14 ENCOUNTER — Encounter: Payer: Self-pay | Admitting: Pediatrics

## 2016-09-14 VITALS — BP 133/81 | HR 86 | Temp 97.4°F | Ht 64.0 in | Wt 238.6 lb

## 2016-09-14 DIAGNOSIS — F329 Major depressive disorder, single episode, unspecified: Secondary | ICD-10-CM

## 2016-09-14 DIAGNOSIS — Z6839 Body mass index (BMI) 39.0-39.9, adult: Secondary | ICD-10-CM | POA: Insufficient documentation

## 2016-09-14 DIAGNOSIS — F0781 Postconcussional syndrome: Secondary | ICD-10-CM

## 2016-09-14 DIAGNOSIS — J4520 Mild intermittent asthma, uncomplicated: Secondary | ICD-10-CM

## 2016-09-14 DIAGNOSIS — R202 Paresthesia of skin: Secondary | ICD-10-CM | POA: Insufficient documentation

## 2016-09-14 DIAGNOSIS — Z1159 Encounter for screening for other viral diseases: Secondary | ICD-10-CM | POA: Diagnosis not present

## 2016-09-14 DIAGNOSIS — G4700 Insomnia, unspecified: Secondary | ICD-10-CM | POA: Insufficient documentation

## 2016-09-14 DIAGNOSIS — R200 Anesthesia of skin: Secondary | ICD-10-CM | POA: Diagnosis not present

## 2016-09-14 DIAGNOSIS — J45909 Unspecified asthma, uncomplicated: Secondary | ICD-10-CM | POA: Insufficient documentation

## 2016-09-14 DIAGNOSIS — F32A Depression, unspecified: Secondary | ICD-10-CM

## 2016-09-14 MED ORDER — TRAZODONE HCL 50 MG PO TABS: 25.0000 mg | ORAL_TABLET | Freq: Every evening | ORAL | 3 refills | Status: DC | PRN

## 2016-09-14 MED ORDER — ALBUTEROL SULFATE HFA 108 (90 BASE) MCG/ACT IN AERS: 2.0000 | INHALATION_SPRAY | Freq: Four times a day (QID) | RESPIRATORY_TRACT | 2 refills | Status: DC | PRN

## 2016-09-14 NOTE — Progress Notes (Signed)
Subjective:   Patient ID: Miranda Perkins, female    DOB: 17-Jun-1961, 55 y.o.   MRN: [id number] CC: Follow-up and Numbness (Left arm worse and right arm)  HPI: Miranda Perkins is a 55 y.o. female presenting for Follow-up and Numbness (Left arm worse and right arm)  Took a couple of weeks off of work HA farther and fewer in between When passenger in a car someitmes still has nausea with sharp turns  Irritability is much better Boys are doing better Still in counseling with family Did start celexa, started waking up at night with headaches  Was taking amitriptyline for insomnia, helped some, but then stopped Sleeping poorly because of pain in L arm Three times has had sharp pain shooting down L arm Having ongoing numbness, happens daily in b/l hands, now all fingers and thumb involved Tingling comes and goes Has dropped things twice with L hand--pan and book Pt is L handed acitivity makes numbness/tingling worse Driving always makes L hand numb Tingling starts first, then numbness, always feels something in hands  Rarely needs albuterol inhaler  Relevant past medical, surgical, family and social history reviewed. Allergies and medications reviewed and updated. History  Smoking Status  . Former Smoker  . Packs/day: 0.50  . Types: Cigarettes  . Quit date: 11/26/2009  Smokeless Tobacco  . Never Used   ROS: Per HPI   Objective:    BP 133/81   Pulse 86   Temp 97.4 F (36.3 C) (Oral)   Ht _0  (1.626 m)   Wt 238 lb 9.6 oz (108.2 kg)   BMI 40.96 kg/m   Wt Readings from Last 3 Encounters:  09/14/16 238 lb 9.6 oz (108.2 kg)  07/15/16 230 lb 12.8 oz (104.7 kg)  06/12/16 224 lb (101.6 kg)    Gen: NAD, alert, cooperative with exam, NCAT EYES: EOMI, no conjunctival injection, or no icterus ENT:  OP without erythema LYMPH: no cervical LAD CV: NRRR, normal S1/S2, no murmur, distal pulses 2+ b/l Resp: CTABL, no wheezes, normal WOB Abd: +BS, soft, NTND. no guarding or  organomegaly Ext: No edema, warm Neuro: hand grip equal b/l, 5/5 strength UE with ext/flex elbow, shoulders  Assessment & Plan:  Miranda Perkins was seen today for follow-up and numbness.  Diagnoses and all orders for this visit:  Mild intermittent reactive airway disease without complication -     albuterol (PROVENTIL HFA;VENTOLIN HFA) 108 (90 Base) MCG/ACT inhaler; Inhale 2 puffs into the lungs every 6 (six) hours as needed for wheezing or shortness of breath.  Need for hepatitis C screening test -     Hepatitis C antibody  Numbness and tingling in both hands Getting worse Some weakness L hand, dropping things accidentally -     MR Cervical Spine Wo Contrast; Future  Severe obesity (BMI >= 40) (HCC) Cont lifestyle changes, inc physical activity, decrease sugar -     Lipid panel; Future -     BMP8+EGFR; Future  Post concussive syndrome Symptoms improved, rare headaches continue  Depression, unspecified depression type Poor reaction to celexa Try below, also for insomnia -     traZODone (DESYREL) 50 MG tablet; Take 0.5-1 tablets (25-50 mg total) by mouth at bedtime as needed for sleep.  Insomnia, unspecified type -     traZODone (DESYREL) 50 MG tablet; Take 0.5-1 tablets (25-50 mg total) by mouth at bedtime as needed for sleep.   Follow up plan: Return in about 3 months (around 12/15/2016). Assunta Found, MD [place]

## 2016-09-14 NOTE — Patient Instructions (Signed)
Come back to clinic for lab draw before eating  MRI ordered

## 2016-09-17 ENCOUNTER — Other Ambulatory Visit

## 2016-09-18 LAB — BMP8+EGFR
BUN / CREAT RATIO: 21 (ref 9–23)
BUN: 15 mg/dL (ref 6–24)
CALCIUM: 9.2 mg/dL (ref 8.7–10.2)
CHLORIDE: 105 mmol/L (ref 96–106)
CO2: 24 mmol/L (ref 18–29)
Creatinine, Ser: 0.71 mg/dL (ref 0.57–1.00)
GFR calc Af Amer: 109 mL/min/{1.73_m2} (ref >59)
GFR calc non Af Amer: 95 mL/min/{1.73_m2} (ref >59)
GLUCOSE: 100 mg/dL — AB (ref 65–99)
POTASSIUM: 4.6 mmol/L (ref 3.5–5.2)
Sodium: 143 mmol/L (ref 134–144)

## 2016-09-18 LAB — LIPID PANEL
CHOL/HDL RATIO: 4 ratio (ref 0.0–4.4)
Cholesterol, Total: 173 mg/dL (ref 100–199)
HDL: 43 mg/dL (ref >39)
LDL CALC: 111 mg/dL — AB (ref 0–99)
Triglycerides: 94 mg/dL (ref 0–149)
VLDL Cholesterol Cal: 19 mg/dL (ref 5–40)

## 2016-09-18 LAB — HEPATITIS C ANTIBODY

## 2016-09-18 LAB — SPECIMEN STATUS REPORT

## 2016-09-23 ENCOUNTER — Ambulatory Visit (HOSPITAL_COMMUNITY)

## 2016-09-29 ENCOUNTER — Ambulatory Visit (HOSPITAL_COMMUNITY)
Admission: RE | Admit: 2016-09-29 | Discharge: 2016-09-29 | Disposition: A | Discharge status: Home / Self Care | Source: Ambulatory Visit | Attending: Pediatrics | Admitting: Pediatrics

## 2016-09-29 DIAGNOSIS — R202 Paresthesia of skin: Secondary | ICD-10-CM | POA: Insufficient documentation

## 2016-09-29 DIAGNOSIS — R200 Anesthesia of skin: Secondary | ICD-10-CM

## 2016-09-29 DIAGNOSIS — M2578 Osteophyte, vertebrae: Secondary | ICD-10-CM | POA: Insufficient documentation

## 2016-10-02 ENCOUNTER — Encounter: Payer: Self-pay | Admitting: Pediatrics

## 2016-10-02 DIAGNOSIS — R200 Anesthesia of skin: Secondary | ICD-10-CM

## 2016-10-02 DIAGNOSIS — R202 Paresthesia of skin: Principal | ICD-10-CM

## 2016-10-02 DIAGNOSIS — R29898 Other symptoms and signs involving the musculoskeletal system: Secondary | ICD-10-CM

## 2016-10-19 ENCOUNTER — Telehealth: Payer: Self-pay | Admitting: Pediatrics

## 2016-10-19 NOTE — Telephone Encounter (Signed)
[facility] and they are just now scheduling referrals from the last week of November. Gave the pt this information and told her that she would be hearing from them for scheduling

## 2016-11-24 ENCOUNTER — Ambulatory Visit: Attending: Neurosurgery | Admitting: Physical Therapy

## 2016-11-24 ENCOUNTER — Encounter: Payer: Self-pay | Admitting: Physical Therapy

## 2016-11-24 DIAGNOSIS — M5412 Radiculopathy, cervical region: Secondary | ICD-10-CM | POA: Diagnosis not present
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
## 2016-11-26 ENCOUNTER — Ambulatory Visit: Admitting: *Deleted

## 2016-11-26 DIAGNOSIS — M5412 Radiculopathy, cervical region: Secondary | ICD-10-CM
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
## 2016-11-30 ENCOUNTER — Encounter: Admitting: Physical Therapy

## 2016-12-04 ENCOUNTER — Ambulatory Visit: Attending: Neurosurgery | Admitting: Physical Therapy

## 2016-12-04 DIAGNOSIS — M5412 Radiculopathy, cervical region: Secondary | ICD-10-CM | POA: Insufficient documentation
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
## 2016-12-04 NOTE — Therapy (Signed)
[facility] [place], [place], [address] Phone: [phone]   Fax:  [phone]  Physical Therapy Treatment  Patient Details  Name: Miranda Perkins MRN: [id number] Date of Birth: 03-09-1961 Referring Provider: Kary Kos, MD  Encounter Date: 12/04/2016      PT End of Session - 12/04/16 1119    Visit Number 3   Number of Visits 12   Date for PT Re-Evaluation 01/05/17   PT Start Time 1118   PT Stop Time 1211   PT Time Calculation (min) 53 min   Activity Tolerance Patient tolerated treatment well   Behavior During Therapy [facility] for tasks assessed/performed      Past Medical History:  Diagnosis Date  . Cancer ([place]) 2016   basal cell, forehead  . 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 [place]  . MOUTH SURGERY    . skin cancer removal    . TUBAL LIGATION      There were no vitals filed for this visit.      Subjective Assessment - 12/04/16 1119    Subjective Patient was unable to get NCV test this week due to long wait at MD office. Patient still experiencing the same sx. Patient felt better after last treatment and pain returned at a slower rate.   Pertinent History skin cancer, R knee bakers cyst,    Diagnostic tests MRI no compression of nerves   Patient Stated Goals Improve arm and hand conditions   Currently in Pain? No/denies  just heavy                         [facility] Adult PT Treatment/Exercise - 12/04/16 0001      Modalities   Modalities Electrical Stimulation;Moist Heat     Moist Heat Therapy   Number Minutes Moist Heat 15 Minutes   Moist Heat Location Cervical     Electrical Stimulation   Electrical Stimulation Location IFC 80-150 Hz to L cervical/UT   Electrical Stimulation Goals Pain     Manual Therapy   Manual Therapy Soft tissue  mobilization;Myofascial release   Soft tissue mobilization to B UT, levator scapulae, cervical paraspinals and L scalenes    Myofascial Release to same          Trigger Point Dry Needling - 12/04/16 1217    Consent Given? Yes   Education Handout Provided Yes   Muscles Treated Upper Body Upper trapezius;Levator scapulae  B and L cervical multifidi   Upper Trapezius Response Twitch reponse elicited;Palpable increased muscle length   Levator Scapulae Response Twitch response elicited;Palpable increased muscle length              PT Education - 12/04/16 1218    Education provided Yes   Education Details HEP: reviewed dry needling aftercare and precautions; discussed using towel roll in pillow.   Person(s) Educated Patient   Methods Explanation;Demonstration   Comprehension Verbalized understanding;Returned demonstration             PT Long Term Goals - 12/04/16 1222      PT LONG TERM GOAL #1   Title I with HEP   Time 6   Period Weeks   Status On-going     PT LONG TERM GOAL #2  Title Improved L grip strength to 46# or better to allow improvement with ADLS.   Time 6   Period Weeks   Status On-going     PT LONG TERM GOAL #3   Title Patient to report decreased BUE neurologic sx to previous level of function or better.   Time 6   Period Weeks   Status On-going     PT LONG TERM GOAL #4   Title Patient able to peform ADLs with 1-2/10 pain or less.   Time 6   Period Weeks   Status On-going               Plan - 12/04/16 1219    Clinical Impression Statement Patient did well with therapy today. She responded well to DN with +localized twitch responses elicited in B musculature. She was able to tolerate deep STW and MFR without complaint. She continues to report heaviness in LUE. Goals are ongoing.   Rehab Potential Excellent   PT Frequency 2x / week   PT Duration 6 weeks   PT Treatment/Interventions ADLs/Self Care Home Management;Electrical  Stimulation;Cryotherapy;Moist Heat;Traction;Ultrasound;Therapeutic activities;Therapeutic exercise;Neuromuscular re-education;Patient/family education;Manual techniques;Dry needling;Taping   PT Next Visit Plan Assess DN; US/manual therapy; cervical and scapular stab; grip and postural strengthening   PT Home Exercise Plan Scalene stretch; cervical tension stretches, corner stretch, cerv and scap retraction   Consulted and Agree with Plan of Care Patient      Patient will benefit from skilled therapeutic intervention in order to improve the following deficits and impairments:  Pain, Impaired sensation, Postural dysfunction, Decreased strength, Impaired UE functional use  Visit Diagnosis: Radiculopathy, cervical region     Problem List Patient Active Problem List   Diagnosis Date Noted  . Reactive airway disease 09/14/2016  . Numbness and tingling in both hands 09/14/2016  . Insomnia 09/14/2016  . Severe obesity (BMI >= 40) ([place]) 09/14/2016  . Post concussive syndrome 04/30/2016  . Depression 11/28/2015  . Leg swelling 11/28/2015  . Multiple thyroid nodules 11/28/2015  . Vitamin B 12 deficiency 11/28/2015  . Rosacea 11/28/2015   Madelyn Flavors PT 12/04/2016, 1:15 PM  [facility] [address] [place], [place], [address] Phone: [phone]   Fax:  [phone]  Name: Miranda Perkins MRN: [id number] Date of Birth: May 06, 1961

## 2016-12-08 ENCOUNTER — Ambulatory Visit: Admitting: Physical Therapy

## 2016-12-08 DIAGNOSIS — M5412 Radiculopathy, cervical region: Secondary | ICD-10-CM | POA: Diagnosis not present

## 2016-12-08 NOTE — Therapy (Signed)
[facility] [place], [place], [address] Phone: [phone]   Fax:  [phone]  Physical Therapy Treatment  Patient Details  Name: Miranda Perkins MRN: [id number] Date of Birth: September 25, 1961 Referring Provider: Kary Kos, MD  Encounter Date: 12/08/2016      PT End of Session - 12/08/16 1116    Visit Number 4   Number of Visits 12   Date for PT Re-Evaluation 01/05/17   PT Start Time 1116   PT Stop Time 1221   PT Time Calculation (min) 65 min   Activity Tolerance Patient tolerated treatment well   Behavior During Therapy [facility] for tasks assessed/performed      Past Medical History:  Diagnosis Date  . Cancer ([place]) 2016   basal cell, forehead  . 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 [place]  . MOUTH SURGERY    . skin cancer removal    . TUBAL LIGATION      There were no vitals filed for this visit.      Subjective Assessment - 12/08/16 1117    Subjective Patient reports that her symptoms are about the same, however she is able to hold her head in a more extended position now without pain.   Pertinent History skin cancer, R knee bakers cyst,    Diagnostic tests MRI no compression of nerves   Patient Stated Goals Improve arm and hand conditions   Currently in Pain? Yes   Pain Score 3    Pain Location Shoulder   Pain Orientation Left   Pain Descriptors / Indicators Aching;Dull   Pain Type Acute pain   Pain Radiating Towards into deltoid   Pain Onset More than a month ago   Pain Frequency Constant   Aggravating Factors  unknown   Pain Relieving Factors nothing            [facility] PT Assessment - 12/08/16 0001      Strength   Overall Strength Comments Grip L 33#                     [facility] Adult PT Treatment/Exercise - 12/08/16 0001      Modalities   Modalities Electrical Stimulation;Moist Heat     Moist Heat Therapy   Number Minutes Moist Heat 15 Minutes   Moist Heat Location Shoulder     Electrical Stimulation   Electrical Stimulation Location IFC 80-150 Hz to L UT/pecs/post cuff x 15 min   Electrical Stimulation Goals Pain     Manual Therapy   Manual Therapy Soft tissue mobilization;Joint mobilization;Myofascial release   Joint Mobilization to upper thoracic spine PA gd III    Soft tissue mobilization to L pecs, deltoids, posteror RC and upper thoracic paraspinals   Myofascial Release to same          Trigger Point Dry Needling - 12/08/16 1209    Consent Given? Yes   Education Handout Provided No   Muscles Treated Upper Body Upper trapezius;Pectoralis major;Pectoralis minor;Subscapularis  L and Deltoids   Upper Trapezius Response Twitch reponse elicited;Palpable increased muscle length   Pectoralis Major Response Twitch response elicited;Palpable increased muscle length   Pectoralis Minor Response Twitch response elicited;Palpable increased muscle length   Levator Scapulae Response Twitch response elicited;Palpable increased muscle length  Subscapularis Response Twitch response elicited;Palpable increased muscle length              PT Education - 12/08/16 1220    Education provided Yes   Education Details self care: self massage for subscap and use of tennis ball and or fingers for TPR; supine pec stretch   Person(s) Educated Patient   Methods Explanation;Demonstration   Comprehension Verbalized understanding;Returned demonstration             PT Long Term Goals - 12/08/16 1124      PT LONG TERM GOAL #1   Title I with HEP   Time 6   Period Weeks   Status On-going     PT LONG TERM GOAL #2   Title Improved L grip strength to 46# or better to allow improvement with ADLS.   Baseline 33# 12/08/16   Time 6   Period Weeks   Status On-going     PT LONG TERM GOAL #3   Title Patient to  report decreased BUE neurologic sx to previous level of function or better.   Time 6   Period Weeks   Status On-going     PT LONG TERM GOAL #4   Title Patient able to peform ADLs with 1-2/10 pain or less.   Time 6   Period Weeks   Status On-going               Plan - 12/08/16 1213    Clinical Impression Statement Patient presents today with same UE sx mainly in LUE and continued pain which she states she doesn't call pain because it's a dull ache. Upon palpation patient had significant tenderness in L pecs, subscapularis, lats, delts and posterior cuff muscles with TPs. She responded well to DN in these areas especially pecs, subscap and deltoids. Patient is progressing with grip strength improving by 10#. Red putty was issued to further gain strength. Remaining goals are ongoing as well. Normal response to modalities.   Rehab Potential Excellent   PT Frequency 2x / week   PT Duration 6 weeks   PT Treatment/Interventions ADLs/Self Care Home Management;Electrical Stimulation;Cryotherapy;Moist Heat;Traction;Ultrasound;Therapeutic activities;Therapeutic exercise;Neuromuscular re-education;Patient/family education;Manual techniques;Dry needling;Taping   PT Next Visit Plan Assess DN; continued STW to L pecs/subscap/RC; US/manual therapy; cervical and scapular stab; grip and postural strengthening   PT Home Exercise Plan Scalene stretch; cervical tension stretches, corner stretch, cerv and scap retraction   Consulted and Agree with Plan of Care Patient      Patient will benefit from skilled therapeutic intervention in order to improve the following deficits and impairments:  Pain, Impaired sensation, Postural dysfunction, Decreased strength, Impaired UE functional use  Visit Diagnosis: Radiculopathy, cervical region     Problem List Patient Active Problem List   Diagnosis Date Noted  . Reactive airway disease 09/14/2016  . Numbness and tingling in both hands 09/14/2016  .  Insomnia 09/14/2016  . Severe obesity (BMI >= 40) ([place]) 09/14/2016  . Post concussive syndrome 04/30/2016  . Depression 11/28/2015  . Leg swelling 11/28/2015  . Multiple thyroid nodules 11/28/2015  . Vitamin B 12 deficiency 11/28/2015  . Rosacea 11/28/2015    Madelyn Flavors PT 12/08/2016, 12:27 PM  [facility] [address] [place], [place], [address] Phone: [phone]   Fax:  [phone]  Name: Angelyna Mccleery MRN: [id number] Date of Birth: 26-Nov-1960

## 2016-12-11 ENCOUNTER — Ambulatory Visit: Admitting: Physical Therapy

## 2016-12-11 DIAGNOSIS — M5412 Radiculopathy, cervical region: Secondary | ICD-10-CM

## 2016-12-11 NOTE — Therapy (Signed)
[facility] [place], [place], [address] Phone: [phone]   Fax:  [phone]  Physical Therapy Treatment  Patient Details  Name: Miranda Perkins MRN: [id number] Date of Birth: 23-Apr-1961 Referring Provider: Kary Kos, MD  Encounter Date: 12/11/2016      PT End of Session - 12/11/16 1115    Visit Number 5   Number of Visits 12   Date for PT Re-Evaluation 01/05/17   PT Start Time 1115   PT Stop Time 1212   PT Time Calculation (min) 57 min   Activity Tolerance Patient tolerated treatment well   Behavior During Therapy [facility] for tasks assessed/performed      Past Medical History:  Diagnosis Date  . Cancer ([place]) 2016   basal cell, forehead  . 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 [place]  . MOUTH SURGERY    . skin cancer removal    . TUBAL LIGATION      There were no vitals filed for this visit.      Subjective Assessment - 12/11/16 1115    Subjective (P)  Patient states she is sore, but no significant change in symptoms.    Pertinent History (P)  skin cancer, R knee bakers cyst,    Diagnostic tests (P)  MRI no compression of nerves   Patient Stated Goals (P)  Improve arm and hand conditions   Currently in Pain? (P)  Yes   Pain Score (P)  1    Pain Location (P)  Shoulder   Pain Orientation (P)  Left   Pain Descriptors / Indicators (P)  Aching;Dull                         [facility] Adult PT Treatment/Exercise - 12/11/16 0001      Modalities   Modalities Electrical Stimulation;Traction     Acupuncturist Stimulation Location Premod to B UT x 15 min 80-150 Hz   Electrical Stimulation Goals Pain     Traction   Type of Traction Cervical   Min (lbs) 5   Max (lbs) 9   Hold Time 99   Rest Time 5   Time 15     Manual  Therapy   Manual Therapy Soft tissue mobilization;Taping;Myofascial release   Manual therapy comments two bouts of manual cervical traction x 15 sec each   Soft tissue mobilization to L scalenes and SCM   Myofascial Release to subocciptitals   Kinesiotex Facilitate Muscle  for postural correction          Trigger Point Dry Needling - 12/11/16 1153    Consent Given? Yes   Education Handout Provided No   Muscles Treated Upper Body Sternocleidomastoid  L and scalenes L   Sternocleidomastoid Response Twitch response elicited;Palpable increased muscle length              PT Education - 12/11/16 1156    Education provided Yes   Education Details self care: TPR for SCM, subocciptitals and scalenes using fingers; alternate ant neck stretch over towel roll at neck.   Person(s) Educated Patient   Methods Explanation;Demonstration   Comprehension Verbalized understanding             PT Long Term Goals - 12/08/16  [place] #1   Title I with HEP   Time 6   Period Weeks   Status On-going     PT LONG TERM GOAL #2   Title Improved L grip strength to 46# or better to allow improvement with ADLS.   Baseline 33# 12/08/16   Time 6   Period Weeks   Status On-going     PT LONG TERM GOAL #3   Title Patient to report decreased BUE neurologic sx to previous level of function or better.   Time 6   Period Weeks   Status On-going     PT LONG TERM GOAL #4   Title Patient able to peform ADLs with 1-2/10 pain or less.   Time 6   Period Weeks   Status On-going               Plan - 12/11/16 1157    Clinical Impression Statement Patient presented today with decreased ache today reporting mainly soreness in B UT. She states she no longer has to hold her arm for support due to pain which is another improvement. L scalenes are very tight with latent TPs referring pain into head with palpation. Patient did well with DN with +twitch response. Postural tape was  applied with precautions and instructions for removing. Traction was initiated at 9# as well. Patient tolerated well.      Patient will benefit from skilled therapeutic intervention in order to improve the following deficits and impairments:     Visit Diagnosis: Radiculopathy, cervical region     Problem List Patient Active Problem List   Diagnosis Date Noted  . Reactive airway disease 09/14/2016  . Numbness and tingling in both hands 09/14/2016  . Insomnia 09/14/2016  . Severe obesity (BMI >= 40) ([place]) 09/14/2016  . Post concussive syndrome 04/30/2016  . Depression 11/28/2015  . Leg swelling 11/28/2015  . Multiple thyroid nodules 11/28/2015  . Vitamin B 12 deficiency 11/28/2015  . Rosacea 11/28/2015    Madelyn Flavors PT 12/11/2016, 12:04 PM  [facility] [address] [place], [place], [address] Phone: [phone]   Fax:  [phone]  Name: Miranda Perkins MRN: [id number] Date of Birth: [birth year]

## 2016-12-14 ENCOUNTER — Ambulatory Visit (INDEPENDENT_AMBULATORY_CARE_PROVIDER_SITE_OTHER): Admitting: Neurology

## 2016-12-14 ENCOUNTER — Encounter: Payer: Self-pay | Admitting: Neurology

## 2016-12-14 VITALS — BP 132/80 | HR 78 | Ht 64.0 in | Wt 238.3 lb

## 2016-12-14 DIAGNOSIS — F419 Anxiety disorder, unspecified: Secondary | ICD-10-CM

## 2016-12-14 DIAGNOSIS — F0781 Postconcussional syndrome: Secondary | ICD-10-CM | POA: Diagnosis not present

## 2016-12-14 NOTE — Patient Instructions (Signed)
1. Refer to Behavioral medicine for psychotherapy 2. Continue physical therapy 3. Follow-up in 6 months, call for any changes
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
## 2016-12-15 ENCOUNTER — Ambulatory Visit: Admitting: Physical Therapy

## 2016-12-15 DIAGNOSIS — M5412 Radiculopathy, cervical region: Secondary | ICD-10-CM

## 2016-12-15 IMAGING — US US EXTREM LOW*R* LIMITED
1 series · 14 of 25 positions shown · non-contrast
Comparison: 11/08/2015 MR

CLINICAL DATA: Right knee pain and fullness in the right popliteal
fossa.

EXAM:
ULTRASOUND RIGHT LOWER EXTREMITY LIMITED
TECHNIQUE: Ultrasound examination of the lower extremity soft tissues was
performed in the area of clinical concern.

[Series 1: us extrem low*right* limited · 0.05mm/px · 14 of 36 slices shown]
[im 1/36]
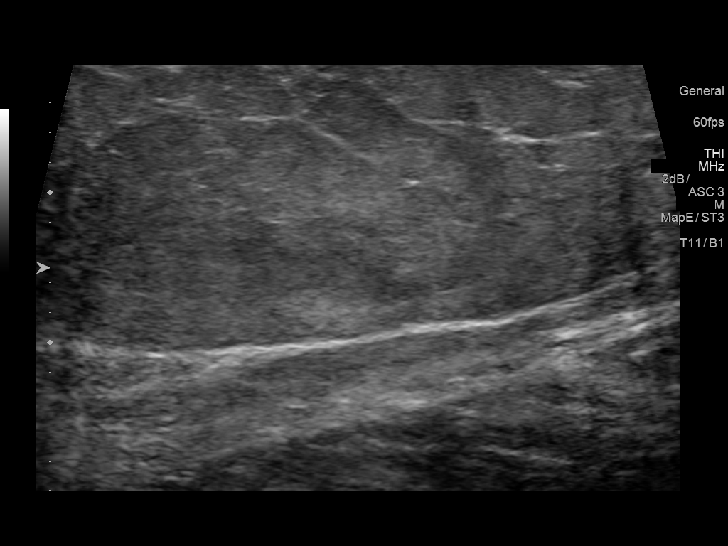
[im 3/36]
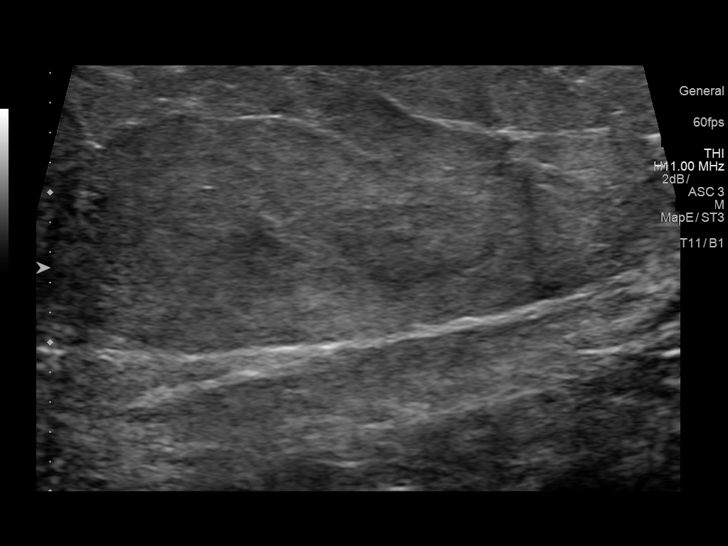
[im 6/36]
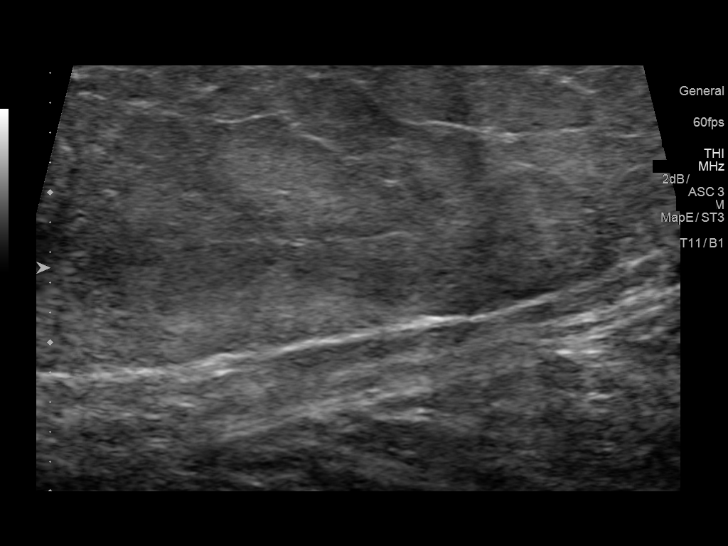
[im 9/36]
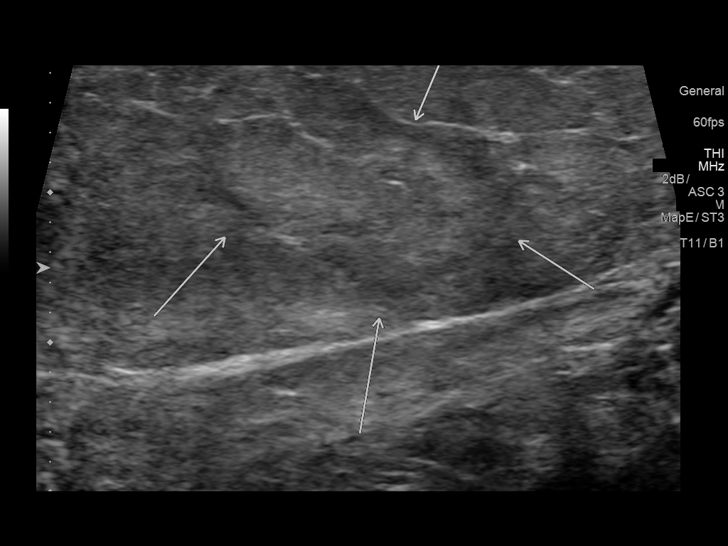
[im 12/36]
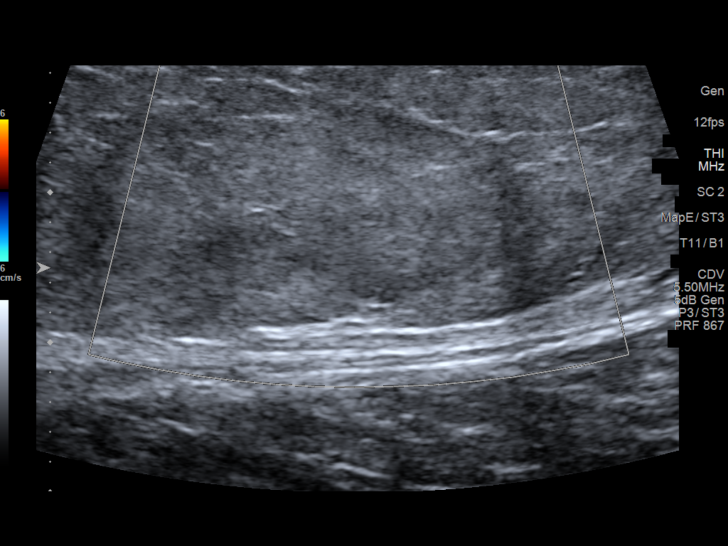
[im 14/36]
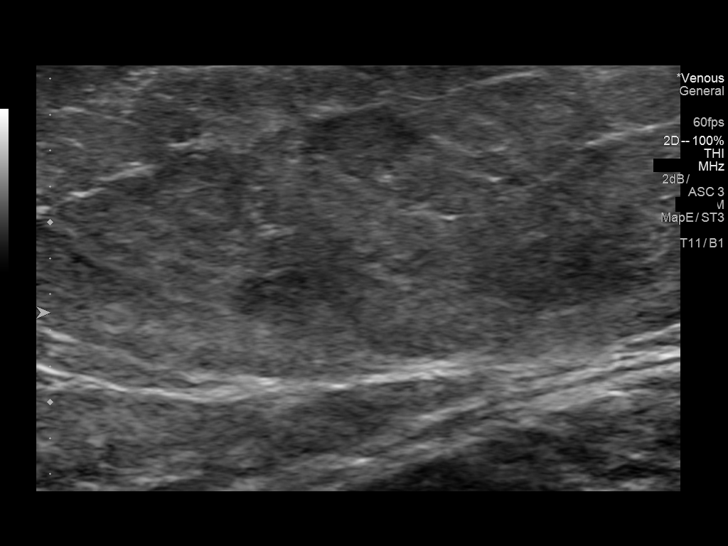
[im 17/36]
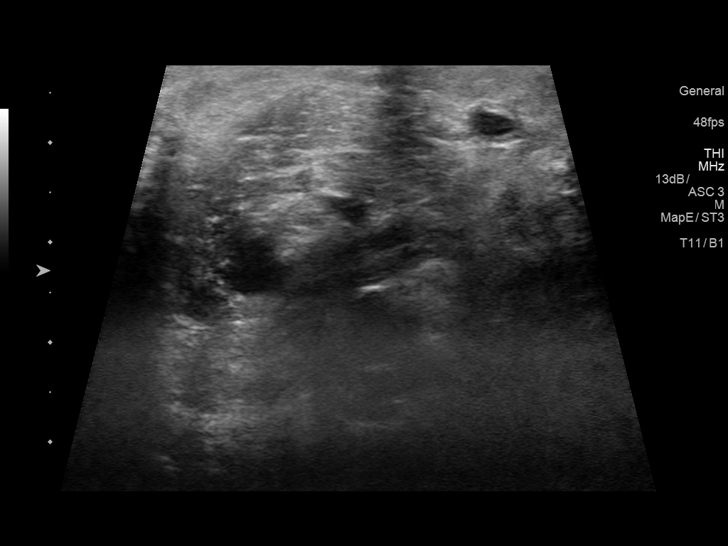
[im 19/36]
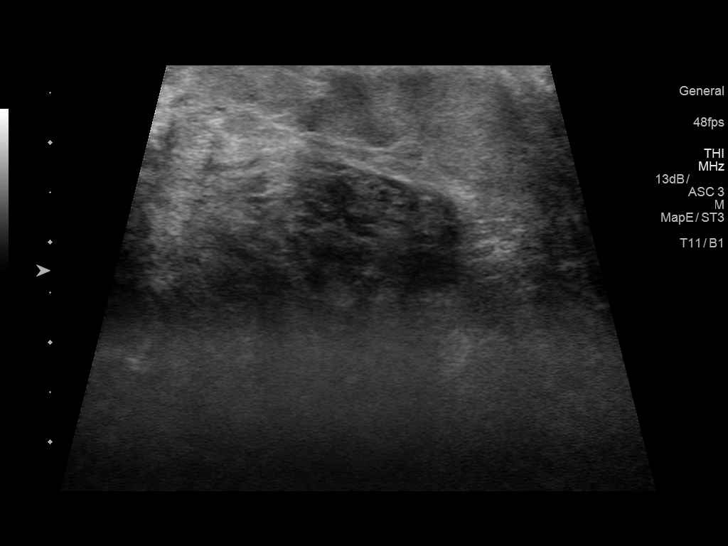
[im 22/36]
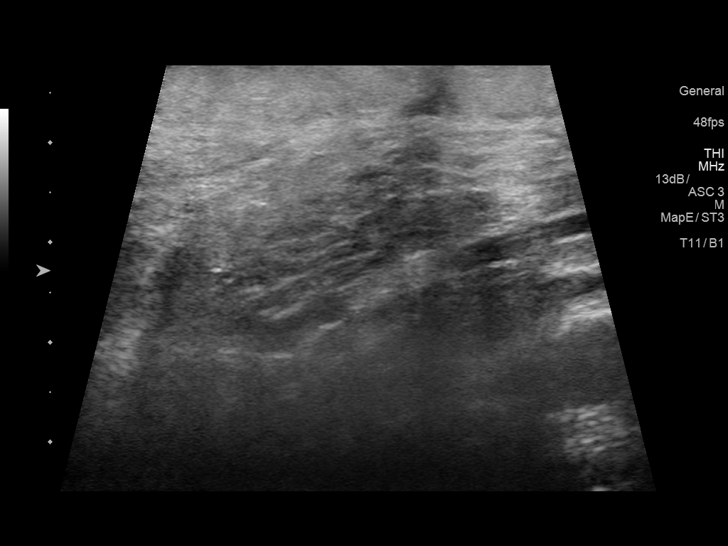
[im 24/36]
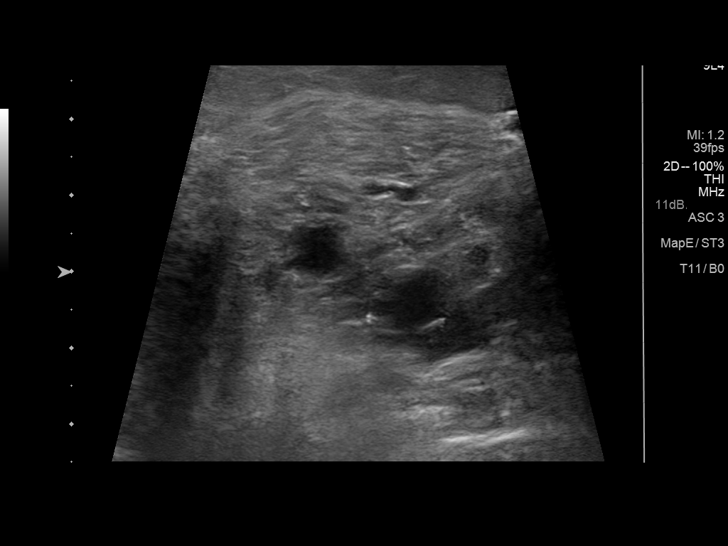
[im 27/36]
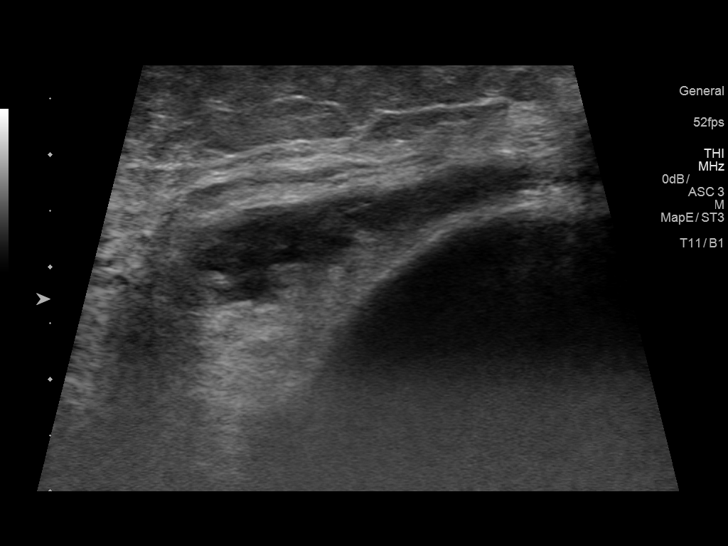
[im 30/36]
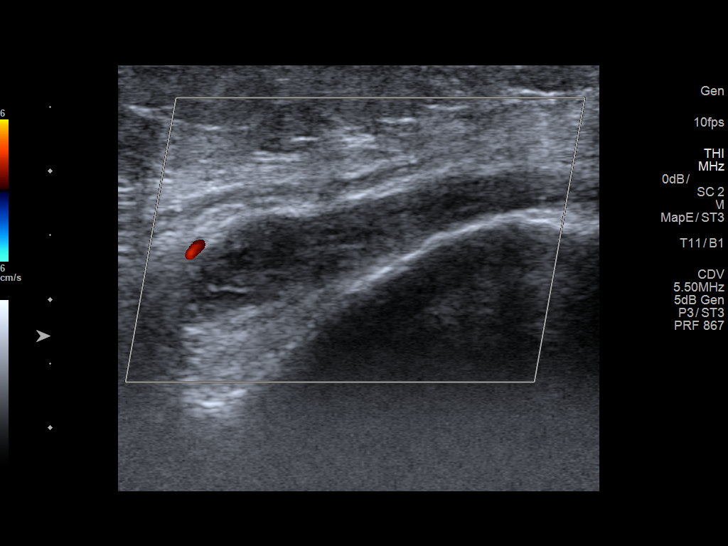
[im 33/36]
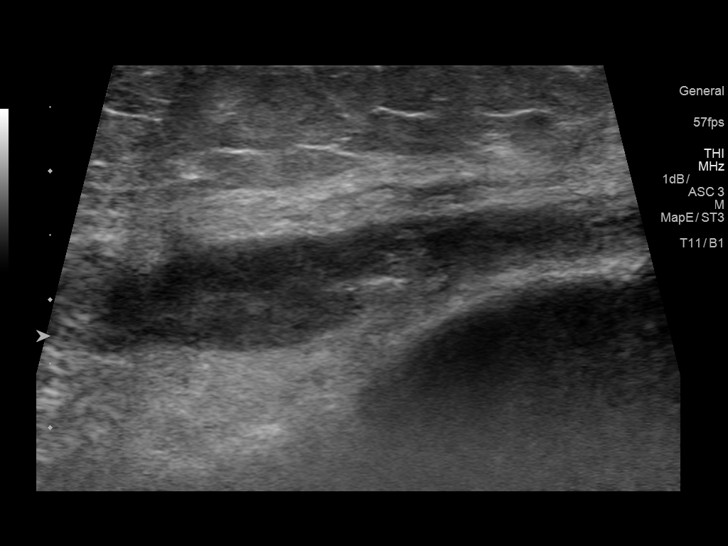
[im 36/36]
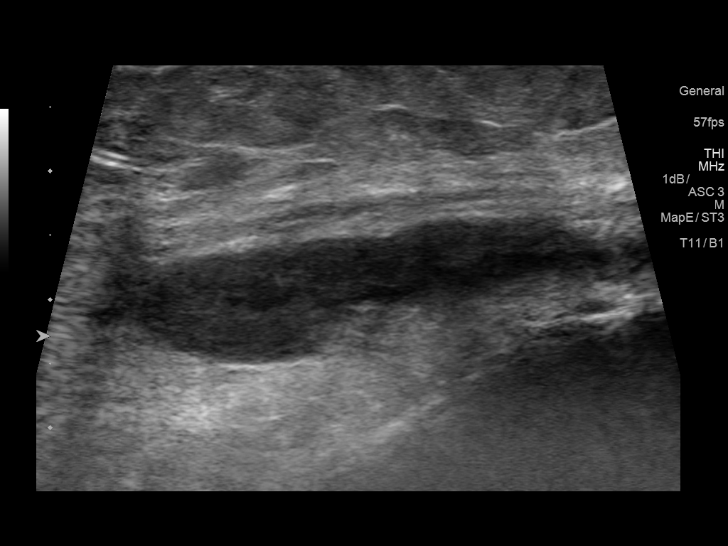

[14 of 25 positions shown; findings below may reference images not displayed]

FINDINGS: A 3.9 x 0.9 x 3.3 cm very hypoechoic area within the popliteal fossa
is compatible with a popliteal/Baker's cyst.

No other abnormalities are identified in the area of fullness.
IMPRESSION: 3.9 x 0.9 x 3.3 cm posterior popliteal/ Baker's cyst.

## 2016-12-15 NOTE — Therapy (Addendum)
[facility] [place], [place], [address] Phone: [phone]   Fax:  [phone]  Physical Therapy Treatment  Patient Details  Name: Miranda Perkins MRN: [id number] Date of Birth: November 27, 1960 Referring Provider: Kary Kos, MD  Encounter Date: 12/15/2016      PT End of Session - 12/15/16 0954    Visit Number 6   Number of Visits 12   Date for PT Re-Evaluation 01/05/17   PT Start Time 0953   PT Stop Time 1049   PT Time Calculation (min) 56 min   Activity Tolerance Patient tolerated treatment well   Behavior During Therapy [facility] for tasks assessed/performed      Past Medical History:  Diagnosis Date  . Cancer ([place]) 2016   basal cell, forehead  . 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 [place]  . MOUTH SURGERY    . skin cancer removal    . TUBAL LIGATION      There were no vitals filed for this visit.      Subjective Assessment - 12/15/16 0956    Subjective Patient states she has had significant decrease in sx since last visit, reporting 2 incidences of tingling in BUE. also decreased frequency of numbness when driving. she also reports that she hasn't dropped anything in the past two weeks.   Pertinent History skin cancer, R knee bakers cyst,    Diagnostic tests MRI no compression of nerves   Patient Stated Goals Improve arm and hand conditions   Currently in Pain? No/denies                         [facility] Adult PT Treatment/Exercise - 12/15/16 0001      Exercises   Exercises Neck     Neck Exercises: Theraband   Rows 20 reps  pink xts   Horizontal ABduction 20 reps;Green     Neck Exercises: Standing   Upper Extremity D1 --   Theraband Level (UE D1) --   Upper Extremity D2 10 reps  B   Theraband Level (UE D2) Level 3 (Green)     Modalities   Modalities Chiropractor Stimulation Location IFC to cervical and UT 80-150 Hz x 15 min   Electrical Stimulation Goals Pain     Traction   Type of Traction Cervical   Min (lbs) 5   Max (lbs) 12   Hold Time 99   Rest Time 5   Time 15     Manual Therapy   Manual Therapy Soft tissue mobilization;Myofascial release   Soft tissue mobilization to B scalenes and SCM   Myofascial Release to B scalenes and SCM          Trigger Point Dry Needling - 12/15/16 1046    Consent Given? Yes   Education Handout Provided No   Muscles Treated Upper Body Sternocleidomastoid  R   Sternocleidomastoid Response Twitch response elicited;Palpable increased muscle length                   PT Long Term Goals - 12/15/16 0955      PT LONG TERM GOAL #1   Title I with HEP   Time 6   Period Weeks  Status On-going     PT LONG TERM GOAL #2   Title Improved L grip strength to 46# or better to allow improvement with ADLS.   Baseline 33# 12/15/16   Time 6   Period Weeks   Status On-going     PT LONG TERM GOAL #3   Title Patient to report decreased BUE neurologic sx to previous level of function or better.   Time 6   Period Weeks   Status On-going     PT LONG TERM GOAL #4   Title Patient able to peform ADLs with 1-2/10 pain or less.   Time 6   Period Weeks   Status Partially Met               Plan - 12/15/16 1041    Clinical Impression Statement Patient presents today with overall improvement of sx. She has less frequency fo N/T in BUE and no pain today. She also reports she has not dropped anything in two weeks although her grip strength remains the same as last assessment. .She had decreased tissue tension in L scalenes and SCM, but R SCM was very tight. She responded well to DN here. Patient also able to tolerate increased wt with traction today. She did well with TE with some reports of pain in Left shoulder.   Rehab  Potential Excellent   PT Frequency 2x / week   PT Duration 6 weeks   PT Treatment/Interventions ADLs/Self Care Home Management;Electrical Stimulation;Cryotherapy;Moist Heat;Traction;Ultrasound;Therapeutic activities;Therapeutic exercise;Neuromuscular re-education;Patient/family education;Manual techniques;Dry needling;Taping   PT Next Visit Plan continued STW to ant neck prn. cervical and scapular stab; grip and postural strengthening. Continue traction prn.   PT Home Exercise Plan Scalene stretch; cervical tension stretches, corner stretch, cerv and scap retraction   Consulted and Agree with Plan of Care Patient      Patient will benefit from skilled therapeutic intervention in order to improve the following deficits and impairments:  Pain, Impaired sensation, Postural dysfunction, Decreased strength, Impaired UE functional use  Visit Diagnosis: Radiculopathy, cervical region     Problem List Patient Active Problem List   Diagnosis Date Noted  . Reactive airway disease 09/14/2016  . Numbness and tingling in both hands 09/14/2016  . Insomnia 09/14/2016  . Severe obesity (BMI >= 40) ([place]) 09/14/2016  . Post concussive syndrome 04/30/2016  . Depression 11/28/2015  . Leg swelling 11/28/2015  . Multiple thyroid nodules 11/28/2015  . Vitamin B 12 deficiency 11/28/2015  . Rosacea 11/28/2015    Madelyn Flavors PT 12/15/2016, 10:47 AM  [facility] [place], [place], [address] Phone: [phone]   Fax:  [phone]  Name: Miranda Perkins MRN: [id number] Date of Birth: 02-11-1961  PHYSICAL THERAPY DISCHARGE SUMMARY  Visits from Start of Care: 6  Current functional level related to goals / functional outcomes: SEE ABOVE   Remaining deficits: SEE ABOVE   Education / Equipment: HEP Plan: Patient agrees to discharge.  Patient goals were partially met. Patient is being discharged due to not returning since the last visit.   ?????    Madelyn Flavors, PT 07/28/18 11:52 AM [facility] [address] [place], [place], [address] Phone: [phone]   Fax:  [phone]

## 2016-12-22 ENCOUNTER — Encounter: Admitting: Physical Therapy

## 2016-12-23 ENCOUNTER — Ambulatory Visit: Admitting: Pediatrics

## 2016-12-31 ENCOUNTER — Ambulatory Visit: Admitting: Pediatrics

## 2017-01-01 ENCOUNTER — Encounter: Admitting: Physical Therapy

## 2017-01-04 ENCOUNTER — Encounter (HOSPITAL_COMMUNITY): Payer: Self-pay | Admitting: Emergency Medicine

## 2017-01-04 ENCOUNTER — Ambulatory Visit (HOSPITAL_COMMUNITY)
Admission: EM | Admit: 2017-01-04 | Discharge: 2017-01-04 | Disposition: A | Discharge status: Home / Self Care | Attending: Family Medicine | Admitting: Family Medicine

## 2017-01-04 DIAGNOSIS — R059 Cough, unspecified: Secondary | ICD-10-CM

## 2017-01-04 DIAGNOSIS — J40 Bronchitis, not specified as acute or chronic: Secondary | ICD-10-CM | POA: Diagnosis not present

## 2017-01-04 DIAGNOSIS — R0602 Shortness of breath: Secondary | ICD-10-CM | POA: Diagnosis not present

## 2017-01-04 DIAGNOSIS — R05 Cough: Secondary | ICD-10-CM

## 2017-01-04 MED ORDER — ALBUTEROL SULFATE HFA 108 (90 BASE) MCG/ACT IN AERS: 1.0000 | INHALATION_SPRAY | Freq: Four times a day (QID) | RESPIRATORY_TRACT | 0 refills | Status: DC | PRN

## 2017-01-04 MED ORDER — IPRATROPIUM BROMIDE 0.06 % NA SOLN: 2.0000 | Freq: Four times a day (QID) | NASAL | 0 refills | Status: DC

## 2017-01-04 MED ORDER — ALBUTEROL SULFATE (2.5 MG/3ML) 0.083% IN NEBU
2.5000 mg | INHALATION_SOLUTION | Freq: Once | RESPIRATORY_TRACT | Status: AC
  Administered 2017-01-04: 2.5 mg via RESPIRATORY_TRACT

## 2017-01-04 MED ORDER — AZITHROMYCIN 250 MG PO TABS: 250.0000 mg | ORAL_TABLET | Freq: Every day | ORAL | 0 refills | Status: DC

## 2017-01-04 MED ORDER — ALBUTEROL SULFATE (2.5 MG/3ML) 0.083% IN NEBU
INHALATION_SOLUTION | RESPIRATORY_TRACT | Status: AC
  Filled 2017-01-04: qty 3

## 2017-01-04 MED ORDER — BENZONATATE 100 MG PO CAPS: 100.0000 mg | ORAL_CAPSULE | Freq: Three times a day (TID) | ORAL | 0 refills | Status: DC

## 2017-01-04 NOTE — ED Provider Notes (Signed)
CSN: [id number]     Arrival date & time 01/04/17  1644 History   First MD Initiated Contact with Patient 01/04/17 1726     Chief Complaint  Patient presents with  . URI   (Consider location/radiation/quality/duration/timing/severity/associated sxs/prior Treatment) Patient c/o sob, cough, chest congestion, and fatigue. She has been having uri sx's   The history is provided by the patient.  URI  Presenting symptoms: congestion, cough and fatigue   Severity:  Moderate Onset quality:  Sudden Duration:  2 days Timing:  Constant Chronicity:  New Relieved by:  Nothing Worsened by:  Nothing Ineffective treatments:  None tried   Past Medical History:  Diagnosis Date  . Cancer ([place]) 2016   basal cell, forehead  . 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 [place]  . MOUTH SURGERY    . skin cancer removal    . TUBAL LIGATION     Family History  Problem Relation Age of Onset  . Rheum arthritis Sister   . Clotting disorder Sister   . Clotting disorder Maternal Aunt   . Clotting disorder Maternal Aunt   . Heart Problems Mother    Social History  Substance Use Topics  . Smoking status: Former Smoker    Packs/day: 0.50    Types: Cigarettes    Quit date: 11/26/2009  . Smokeless tobacco: Never Used  . Alcohol use No     Comment: maybe 2 drinks a year   OB History    No data available     Review of Systems  Constitutional: Positive for fatigue.  HENT: Positive for congestion.   Eyes: Negative.   Respiratory: Positive for cough.   Cardiovascular: Negative.   Gastrointestinal: Negative.   Endocrine: Negative.   Genitourinary: Negative.   Allergic/Immunologic: Negative.   Neurological: Negative.   Hematological: Negative.   Psychiatric/Behavioral: Negative.     Allergies  Influenza vaccines; Penicillins; and Cortisone  Home  Medications   Prior to Admission medications   Medication Sig Start Date End Date Taking? Authorizing Provider  ibuprofen (ADVIL,MOTRIN) 400 MG tablet Take 400 mg by mouth every 6 (six) hours as needed.   Yes Historical Provider, MD  Pseudoeph-Doxylamine-DM-APAP (NYQUIL PO) Take by mouth.   Yes Historical Provider, MD  Pseudoephedrine-APAP-DM (DAYQUIL PO) Take by mouth.   Yes Historical Provider, MD  albuterol (PROVENTIL HFA;VENTOLIN HFA) 108 (90 Base) MCG/ACT inhaler Inhale 2 puffs into the lungs every 6 (six) hours as needed for wheezing or shortness of breath. Patient not taking: Reported on 01/04/2017 09/14/16   Eustaquio Maize, MD  albuterol (PROVENTIL HFA;VENTOLIN HFA) 108 (90 Base) MCG/ACT inhaler Inhale 1-2 puffs into the lungs every 6 (six) hours as needed for wheezing or shortness of breath. 01/04/17   Lysbeth Penner, FNP  azithromycin (ZITHROMAX) 250 MG tablet Take 1 tablet (250 mg total) by mouth daily. Take first 2 tablets together, then 1 every day until finished. 01/04/17   Lysbeth Penner, FNP  benzonatate (TESSALON) 100 MG capsule Take 1 capsule (100 mg total) by mouth every 8 (eight) hours. 01/04/17   Lysbeth Penner, FNP  ipratropium (ATROVENT) 0.06 % nasal spray Place 2 sprays into both nostrils 4 (four) times daily. 01/04/17   Lysbeth Penner, FNP  traZODone (DESYREL) 50 MG tablet Take 0.5-1 tablets (25-50  mg total) by mouth at bedtime as needed for sleep. Patient not taking: Reported on 12/14/2016 09/14/16   Eustaquio Maize, MD  vitamin B-12 (CYANOCOBALAMIN) 1000 MCG tablet Take 1 tablet (1,000 mcg total) by mouth daily. 11/27/15   Eustaquio Maize, MD   Meds Ordered and Administered this Visit   Medications  albuterol (PROVENTIL) (2.5 MG/3ML) 0.083% nebulizer solution 2.5 mg (2.5 mg Nebulization Given 01/04/17 1746)    BP 137/78 (BP Location: Left Arm) Comment (BP Location): large cuff  Pulse 83   Temp 97.7 F (36.5 C) (Oral)   Resp 24   SpO2 96%  No data  found.   Physical Exam  Constitutional: She appears well-developed and well-nourished.  HENT:  Head: Normocephalic and atraumatic.  Right Ear: External ear normal.  Left Ear: External ear normal.  Mouth/Throat: Oropharynx is clear and moist.  Eyes: Conjunctivae and EOM are normal. Pupils are equal, round, and reactive to light.  Neck: Normal range of motion. Neck supple.  Cardiovascular: Normal rate, regular rhythm and normal heart sounds.   Pulmonary/Chest: Effort normal. She has wheezes.  Bilateral breath sounds diminished and with few scattered wheezes.  Nursing note and vitals reviewed.   Urgent Care Course     Procedures (including critical care time)  Labs Review Labs Reviewed - No data to display  Imaging Review No results found.   Visual Acuity Review  Right Eye Distance:   Left Eye Distance:   Bilateral Distance:    Right Eye Near:   Left Eye Near:    Bilateral Near:         MDM   1. Bronchitis   2. Cough   3. Shortness of breath    ZPak as directed Albuterol Nebulizer Albuterol MDI Tessalon Perles 100 mg one po tid prn  Push po fluids, rest, tylenol and motrin otc prn as directed for fever, arthralgias, and myalgias.  Follow up prn if sx's continue or persist.   Lysbeth Penner, FNP 01/04/17 [place], FNP 01/04/17 [phone]

## 2017-01-04 NOTE — ED Triage Notes (Signed)
See s/s.  Patient reports she is getting worse and is out of breath.

## 2017-01-05 ENCOUNTER — Encounter: Admitting: Physical Therapy

## 2017-01-07 ENCOUNTER — Ambulatory Visit: Admitting: Pediatrics

## 2017-01-11 ENCOUNTER — Encounter: Payer: Self-pay | Admitting: Pediatrics

## 2017-01-11 ENCOUNTER — Ambulatory Visit (INDEPENDENT_AMBULATORY_CARE_PROVIDER_SITE_OTHER): Admitting: Pediatrics

## 2017-01-11 VITALS — BP 121/71 | HR 70 | Temp 97.2°F | Resp 22 | Ht 64.0 in | Wt 233.0 lb

## 2017-01-11 DIAGNOSIS — F0781 Postconcussional syndrome: Secondary | ICD-10-CM

## 2017-01-11 DIAGNOSIS — Z6839 Body mass index (BMI) 39.0-39.9, adult: Secondary | ICD-10-CM

## 2017-01-11 DIAGNOSIS — G4700 Insomnia, unspecified: Secondary | ICD-10-CM

## 2017-01-11 DIAGNOSIS — J4520 Mild intermittent asthma, uncomplicated: Secondary | ICD-10-CM

## 2017-01-11 DIAGNOSIS — R202 Paresthesia of skin: Secondary | ICD-10-CM | POA: Diagnosis not present

## 2017-01-11 DIAGNOSIS — F32A Depression, unspecified: Secondary | ICD-10-CM

## 2017-01-11 DIAGNOSIS — F329 Major depressive disorder, single episode, unspecified: Secondary | ICD-10-CM | POA: Diagnosis not present

## 2017-01-11 NOTE — Progress Notes (Signed)
  Subjective:   Patient ID: Miranda Perkins, female    DOB: Apr 14, 1961, 56 y.o.   MRN: [id number] CC: Follow-up (8 week) multiple med problems  HPI: Miranda Perkins is a 56 y.o. female presenting for Follow-up (8 week)  Ongoing concussion symptoms Says she is going to follow up with neuropsychiatrist per neurology recommendations  Going to have neck surgery and carpal tunnel upcoming Husband has been ill, new afib, CAD symptomatic, lots of recent hospitalizations Surgery she wants to wait until after he has a procedure done, is not sure when his surgery is going to take place Continues to drop things at times from L hand Pt L handed  Sleep: worrying a lot about husband, not taking anything now Doesn't think she needs anything  Depression: mood has been fine  RAD: recently treated with azithromycin for bronchitis Was wheezing at the time, last took albuterol this morning  Relevant past medical, surgical, family and social history reviewed. Allergies and medications reviewed and updated. History  Smoking Status  . Former Smoker  . Packs/day: 0.50  . Types: Cigarettes  . Quit date: 11/26/2009  Smokeless Tobacco  . Never Used   ROS: Per HPI   Objective:    BP 121/71   Pulse 70   Temp 97.2 F (36.2 C) (Oral)   Resp (!) 22   Ht 5\' 4"  (1.626 m)   Wt 233 lb (105.7 kg)   SpO2 98%   BMI 39.99 kg/m   Wt Readings from Last 3 Encounters:  01/11/17 233 lb (105.7 kg)  12/14/16 238 lb 5 oz (108.1 kg)  09/14/16 238 lb 9.6 oz (108.2 kg)    Gen: NAD, alert, cooperative with exam, NCAT EYES: EOMI, no conjunctival injection, or no icterus ENT:  TMs pearly gray b/l, OP without erythema LYMPH: no cervical LAD CV: NRRR, normal S1/S2, no murmur, distal pulses 2+ b/l Resp: CTABL, no wheezes, normal WOB Abd: +BS, soft, NTND. no guarding or organomegaly Ext: No edema, warm Neuro: Alert and oriented Psych: normal affect, no thoughts of self harm  Assessment & Plan:  Miranda Perkins was  seen today for follow-up multiple med problems  Diagnoses and all orders for this visit:  Depression, unspecified depression type Stable, feels safe at home Self discontinued meds  Paresthesia of both hands Upcoming surgery on neck and carpal tunnel  Insomnia, unspecified type Stable, not taking meds  BMI 39.0-39.9,adult Elevated Lost 5 lbs with decrease in salt intake With husbands ongoing med problems has been changing eating habits  Post concussive syndrome F/u with neurology and neuropsychaitry  Mild intermittent reactive airway disease without complication Improved from recent bronchitis Let me know if symptoms worsen or needing albuterol regularly when not sick  Follow up plan: 6 mo, sooner if needed Assunta Found, MD [place]

## 2017-01-21 ENCOUNTER — Telehealth: Payer: Self-pay | Admitting: Pediatrics

## 2017-01-21 NOTE — Telephone Encounter (Signed)
Spoke with pt, letter written

## 2017-01-22 ENCOUNTER — Encounter: Payer: Self-pay | Admitting: Pediatrics

## 2017-01-22 ENCOUNTER — Telehealth: Payer: Self-pay | Admitting: Pediatrics

## 2017-01-22 NOTE — Telephone Encounter (Signed)
Miranda Perkins placed letter at front desk, patient aware it is ready for pick up

## 2017-01-22 NOTE — Telephone Encounter (Signed)
Printed

## 2017-03-09 ENCOUNTER — Encounter: Payer: Self-pay | Admitting: Physician Assistant

## 2017-03-09 ENCOUNTER — Ambulatory Visit (INDEPENDENT_AMBULATORY_CARE_PROVIDER_SITE_OTHER): Admitting: Physician Assistant

## 2017-03-09 VITALS — BP 133/87 | HR 73 | Temp 97.1°F | Ht 64.0 in | Wt 234.0 lb

## 2017-03-09 DIAGNOSIS — W57XXXA Bitten or stung by nonvenomous insect and other nonvenomous arthropods, initial encounter: Secondary | ICD-10-CM

## 2017-03-09 DIAGNOSIS — S80922A Unspecified superficial injury of left lower leg, initial encounter: Secondary | ICD-10-CM | POA: Diagnosis not present

## 2017-03-09 MED ORDER — DOXYCYCLINE HYCLATE 100 MG PO TABS: 100.0000 mg | ORAL_TABLET | Freq: Two times a day (BID) | ORAL | 0 refills | Status: DC

## 2017-03-09 NOTE — Patient Instructions (Signed)
Lyme Disease Lyme disease is an infection that affects many parts of the body, including the skin, joints, and nervous system. It is a bacterial infection that starts from the bite of an infected tick. The infection can spread, and some of the symptoms are similar to the flu. If Lyme disease is not treated, it may cause joint pain, swelling, numbness, problems thinking, fatigue, muscle weakness, and other problems. What are the causes? This condition is caused by bacteria called Borrelia burgdorferi. You can get Lyme disease by being bitten by an infected tick. The tick must be attached to your skin to pass along the infection. Deer often carry infected ticks. What increases the risk? The following factors may make you more likely to develop this condition:  Living in or visiting these areas in the [place]  [place].  The mid[place] states.  The [place].  Spending time in wooded or grassy areas.  Being outdoors with exposed skin.  Camping, gardening, hiking, fishing, or hunting outdoors.  Failing to remove a tick from your skin within 3-4 days. What are the signs or symptoms? Symptoms of this condition include:  A round, red rash that surrounds the center of the tick bite. This is the first sign of infection. The center of the rash may be blood colored or have tiny blisters.  Fatigue.  Headache.  Chills and fever.  General achiness.  Joint pain, often in the knees.  Muscle pain.  Swollen lymph glands.  Stiff neck. How is this diagnosed? This condition is diagnosed based on:  Your symptoms and medical history.  A physical exam.  A blood test. How is this treated? The main treatment for this condition is antibiotic medicine, which is usually taken by mouth (orally). The length of treatment depends on how soon after a tick bite you begin taking the medicine. In some cases, treatment is necessary for several weeks. If the infection is severe, antibiotics may  need to be given through an IV tube that is inserted into one of your veins. Follow these instructions at home:  Take your antibiotic medicine as told by your health care provider. Do not stop taking the antibiotic even if you start to feel better.  Ask your health care provider about takinga probiotic in between doses of your antibiotic to help avoid stomach upset or diarrhea.  Check with your health care provider before supplementing your treatment. Many alternative therapies have not been proven and may be harmful to you.  Keep all follow-up visits as told by your health care provider. This is important. How is this prevented? You can become reinfected if you get another tick bite from an infected tick. Take these steps to help prevent an infection:  Cover your skin with light-colored clothing when you are outdoors in the spring and summer months.  Spray clothing and skin with bug spray. The spray should be 20-30% DEET.  Avoid wooded, grassy, and shaded areas.  Remove yard litter, brush, trash, and plants that attract deer and rodents.  Check yourself for ticks when you come indoors.  Wash clothing worn each day.  Check your pets for ticks before they come inside.  If you find a tick:  Remove it with tweezers.  Clean your hands and the bite area with rubbing alcohol or soap and water. Pregnant women should take special care to avoid tick bites because the infection can be passed along to the fetus. Contact a health care provider if:  You have symptoms after   treatment.  You have removed a tick and want to bring it to your health care provider for testing. Get help right away if:  You have an irregular heartbeat.  You have nerve pain.  Your face feels numb. This information is not intended to replace advice given to you by your health care provider. Make sure you discuss any questions you have with your health care provider. Document Released: 01/25/2001 Document  Revised: 06/09/2016 Document Reviewed: 06/09/2016 Elsevier Interactive Patient Education  2017 Elsevier Inc.  

## 2017-03-11 NOTE — Progress Notes (Signed)
BP 133/87 (BP Location: Right Arm)   Pulse 73   Temp 97.1 F (36.2 C) (Oral)   Ht 5\' 4"  (1.626 m)   Wt 234 lb (106.1 kg)   BMI 40.17 kg/m    Subjective:    Patient ID: Miranda Perkins, female    DOB: [birth year], 56 y.o.   MRN: [id number]  HPI: Miranda Perkins is a 56 y.o. female presenting on 03/09/2017 for Tick Removal (left lower leg / red- streak area)  Has had many tick bites due to her yard work and gardening. She got several deer ticks off of herself 5 days ago. Denies fever, chills or headache. Does have lots of redness around the bite marks.  Relevant past medical, surgical, family and social history reviewed and updated as indicated. Allergies and medications reviewed and updated.  Past Medical History:  Diagnosis Date  . 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 [place]  . MOUTH SURGERY    . skin cancer removal    . TUBAL LIGATION      Review of Systems  Constitutional: Negative.  Negative for activity change, fatigue and fever.  HENT: Negative.   Eyes: Negative.   Respiratory: Negative.  Negative for cough.   Cardiovascular: Negative.  Negative for chest pain.  Gastrointestinal: Negative.  Negative for abdominal pain.  Endocrine: Negative.   Genitourinary: Negative.  Negative for dysuria.  Musculoskeletal: Negative.  Negative for arthralgias and joint swelling.  Skin: Positive for color change, rash and wound.  Neurological: Negative.  Negative for weakness and headaches.    Allergies as of 03/09/2017      Reactions   Influenza Vaccines Anaphylaxis   Per patient   Penicillins Anaphylaxis   Cortisone Hives      Medication List       Accurate as of 03/09/17 11:59 PM. Always use your most recent med list.          doxycycline 100 MG tablet Commonly known as:   VIBRA-TABS Take 1 tablet (100 mg total) by mouth 2 (two) times daily.   ibuprofen 400 MG tablet Commonly known as:  ADVIL,MOTRIN Take 400 mg by mouth every 6 (six) hours as needed.          Objective:    BP 133/87 (BP Location: Right Arm)   Pulse 73   Temp 97.1 F (36.2 C) (Oral)   Ht 5\' 4"  (1.626 m)   Wt 234 lb (106.1 kg)   BMI 40.17 kg/m   Allergies  Allergen Reactions  . Influenza Vaccines Anaphylaxis    Per patient  . Penicillins Anaphylaxis  . Cortisone Hives    Physical Exam  Constitutional: She is oriented to person, place, and time. She appears well-developed and well-nourished.  HENT:  Head: Normocephalic and atraumatic.  Eyes: Conjunctivae and EOM are normal. Pupils are equal, round, and reactive to light.  Cardiovascular: Normal rate, regular rhythm, normal heart sounds and intact distal pulses.   Pulmonary/Chest: Effort normal and breath sounds normal.  Abdominal: Soft. Bowel sounds are normal.  Neurological: She is alert and oriented to person, place, and time. She has normal reflexes.  Skin: Skin is warm and dry. Rash noted. Rash is macular. There is erythema.  Psychiatric: She has a normal mood and affect. Her behavior is normal. Judgment and thought content normal.  Nursing note and vitals reviewed.       Assessment & Plan:   1. Tick bite, initial encounter - doxycycline (VIBRA-TABS) 100 MG tablet; Take 1 tablet (100 mg total) by mouth 2 (two) times daily.  Dispense: 20 tablet; Refill: 0   Current Outpatient Prescriptions:  .  ibuprofen (ADVIL,MOTRIN) 400 MG tablet, Take 400 mg by mouth every 6 (six) hours as needed., Disp: , Rfl:  .  doxycycline (VIBRA-TABS) 100 MG tablet, Take 1 tablet (100 mg total) by mouth 2 (two) times daily., Disp: 20 tablet, Rfl: 0  Continue all other maintenance medications as listed above.  Follow up plan: No Follow-up on file.  Educational handout given for tick bite  Terald Sleeper PA-C [place] [address]  [place], [place] [address] [phone]   03/11/2017, 10:07 PM

## 2017-03-23 ENCOUNTER — Telehealth: Payer: Self-pay | Admitting: Neurology

## 2017-03-23 NOTE — Telephone Encounter (Signed)
PT called and said Dr Delice Lesch was referring her to behavorial medicine and she has not heard anything yet

## 2017-03-24 NOTE — Telephone Encounter (Signed)
Returned pt call.  Let her know that the referral went through on our end.  Gave her the phone number to outpatient [facility] for scheduling.

## 2017-03-31 ENCOUNTER — Other Ambulatory Visit

## 2017-03-31 ENCOUNTER — Other Ambulatory Visit: Payer: Self-pay | Admitting: "Endocrinology

## 2017-03-31 DIAGNOSIS — E042 Nontoxic multinodular goiter: Secondary | ICD-10-CM

## 2017-04-01 ENCOUNTER — Ambulatory Visit: Admitting: "Endocrinology

## 2017-04-01 LAB — T4, FREE: Free T4: 1.25 ng/dL (ref 0.82–1.77)

## 2017-04-08 ENCOUNTER — Ambulatory Visit: Admitting: "Endocrinology

## 2017-04-16 LAB — TSH: TSH: 2.52 u[IU]/mL (ref 0.450–4.500)

## 2017-04-16 LAB — PLEASE NOTE

## 2017-05-03 ENCOUNTER — Ambulatory Visit (INDEPENDENT_AMBULATORY_CARE_PROVIDER_SITE_OTHER): Admitting: Pediatrics

## 2017-05-03 ENCOUNTER — Telehealth: Payer: Self-pay | Admitting: Pediatrics

## 2017-05-03 ENCOUNTER — Encounter: Payer: Self-pay | Admitting: Pediatrics

## 2017-05-03 ENCOUNTER — Ambulatory Visit (INDEPENDENT_AMBULATORY_CARE_PROVIDER_SITE_OTHER)

## 2017-05-03 VITALS — BP 111/71 | HR 70 | Temp 97.3°F | Ht 64.0 in | Wt 238.2 lb

## 2017-05-03 DIAGNOSIS — M25562 Pain in left knee: Secondary | ICD-10-CM | POA: Diagnosis not present

## 2017-05-03 DIAGNOSIS — R0683 Snoring: Secondary | ICD-10-CM | POA: Diagnosis not present

## 2017-05-03 MED ORDER — TRAMADOL HCL 50 MG PO TABS: 50.0000 mg | ORAL_TABLET | Freq: Two times a day (BID) | ORAL | 2 refills | Status: DC | PRN

## 2017-05-03 MED ORDER — INDOMETHACIN 50 MG PO CAPS: 50.0000 mg | ORAL_CAPSULE | Freq: Three times a day (TID) | ORAL | 1 refills | Status: DC | PRN

## 2017-05-03 NOTE — Progress Notes (Signed)
  Subjective:   Patient ID: Miranda Perkins, female    DOB: [birth year], 56 y.o.   MRN: [id number] CC: Leg Swelling (Left)  HPI: Miranda Perkins is a 56 y.o. female presenting for Leg Swelling (Left)  L knee started swelling 3 weeks ago Has been hurting daily since then Sometimes more than others Gets progressively worse throughout the day No redness now, swelling down today A couple of weeks ago pt thinks it was red and even more tender No h/o gout Taking indomethacin twice a day Helps with swelling, not helping with pain Not able to sleep due to severity of pain at times  [facility] onto R leg/side 8 days ago, some pain at first on R side and leg, improved now Saw ortho severla months ago for R knee, was told is OA, due to allergy to cortisone options are limited  Snores regularly Husband with her today Says often uneven, has pauses with breathing She says she never feels well rested  Relevant past medical, surgical, family and social history reviewed. Allergies and medications reviewed and updated. History  Smoking Status  . Former Smoker  . Packs/day: 0.50  . Types: Cigarettes  . Quit date: 11/26/2009  Smokeless Tobacco  . Never Used   ROS: Per HPI   Objective:    BP 111/71   Pulse 70   Temp 97.3 F (36.3 C) (Oral)   Ht 5\' 4"  (1.626 m)   Wt 238 lb 3.2 oz (108 kg)   BMI 40.89 kg/m   Wt Readings from Last 3 Encounters:  05/03/17 238 lb 3.2 oz (108 kg)  03/09/17 234 lb (106.1 kg)  01/11/17 233 lb (105.7 kg)    Gen: NAD, alert, cooperative with exam, NCAT EYES: EOMI, no conjunctival injection, or no icterus ENT:   OP without erythema CV: NRRR, normal S1/S2, no murmur, distal pulses 2+ b/l Resp: CTABL, no wheezes, normal WOB Ext: No edema, warm Neuro: Alert and oriented MSK: L knee slightly swollen compared with L, no redness  discomfort with patellar rocking R knee full ROM, flexion to apprx 70 degrees without pain L knee pain with flex >90 degrees L knee with  medial and lateral joint line tenderness  Assessment & Plan:  Miranda Perkins was seen today for leg swelling.  Diagnoses and all orders for this visit:  Pain in lateral portion of left knee Likely due to OA, present on xray Indomethacin TID prn, take regularly next few days Tramadol for severe pain -     traMADol (ULTRAM) 50 MG tablet; Take 1-2 tablets (50-100 mg total) by mouth every 12 (twelve) hours as needed. -     indomethacin (INDOCIN) 50 MG capsule; Take 1 capsule (50 mg total) by mouth 3 (three) times daily as needed. -     Uric Acid -     Sedimentation Rate -     Basic Metabolic Panel -     DG Knee 1-2 Views Left; Future  Snoring Needs sleep study for sleep apnea eval Pt wants to defer for now with husbands ongoing med problems and her hopefully upcoming neck surgery, will let me know if she wants the referral before next appt  Follow up plan: Return in about 2 months (around 07/04/2017). Assunta Found, MD [place]

## 2017-05-04 LAB — URIC ACID: URIC ACID: 8.8 mg/dL — AB (ref 2.5–7.1)

## 2017-05-04 LAB — BASIC METABOLIC PANEL
BUN / CREAT RATIO: 24 — AB (ref 9–23)
BUN: 21 mg/dL (ref 6–24)
CO2: 19 mmol/L — AB (ref 20–29)
Calcium: 9.7 mg/dL (ref 8.7–10.2)
Chloride: 105 mmol/L (ref 96–106)
Creatinine, Ser: 0.88 mg/dL (ref 0.57–1.00)
GFR calc Af Amer: 84 mL/min/{1.73_m2} (ref >59)
GFR calc non Af Amer: 73 mL/min/{1.73_m2} (ref >59)
GLUCOSE: 84 mg/dL (ref 65–99)
POTASSIUM: 4.7 mmol/L (ref 3.5–5.2)
SODIUM: 142 mmol/L (ref 134–144)

## 2017-05-04 LAB — SEDIMENTATION RATE: Sed Rate: 3 mm/hr (ref 0–40)

## 2017-05-04 NOTE — Telephone Encounter (Signed)
Spoke with patient and pharmacy, [facility] can only fill her Tramadol for a 7 day supply instead of the 15 day prescribed because of her insurance.  If she needs more than a 7 day supply, she will need to get another refill from [place].  Patient wants to make you aware just in case she does call back for a refill you will know the circumstance.

## 2017-05-04 NOTE — Telephone Encounter (Signed)
Advised patient that Dr Evette Doffing is aware of this limitation. She doesn't expect that she'll need more than 1 week of tramadol but if she does she is welcome to call back. Patient was agreeable.

## 2017-05-11 ENCOUNTER — Encounter: Payer: Self-pay | Admitting: Pediatrics

## 2017-05-11 MED ORDER — PREDNISONE 10 MG (21) PO TBPK: ORAL_TABLET | ORAL | 0 refills | Status: DC

## 2017-05-14 ENCOUNTER — Encounter: Payer: Self-pay | Admitting: Family Medicine

## 2017-05-14 ENCOUNTER — Ambulatory Visit (INDEPENDENT_AMBULATORY_CARE_PROVIDER_SITE_OTHER): Admitting: Family Medicine

## 2017-05-14 VITALS — BP 128/77 | HR 81 | Temp 96.7°F | Ht 64.0 in | Wt 238.4 lb

## 2017-05-14 DIAGNOSIS — M25562 Pain in left knee: Secondary | ICD-10-CM

## 2017-05-14 MED ORDER — DICLOFENAC SODIUM 75 MG PO TBEC: 75.0000 mg | DELAYED_RELEASE_TABLET | Freq: Two times a day (BID) | ORAL | 0 refills | Status: DC

## 2017-05-14 NOTE — Progress Notes (Signed)
   HPI  Patient presents today here with left knee pain.  Patient explains that she has continued left knee pain.  She has been taking all medications as prescribed except for indomethacin. She states that caused diarrhea and upset stomach.  He previously had an adverse reaction to a knee injection on the right. She was told never to receive a cortisone injection in the knee again. This was by orthopedics.  She denies fever, chills, sweats.  Symptoms started after all and landing on the right side. She did not think that she hurt her left knee, however after that she started developing bilateral knee pain. She now has mostly medial knee pain with posterior pain with bearing weight. No joint laxity or knee instability symptoms. She has had popping symptoms on 3 different occasions.  PMH: Smoking status noted ROS: Per HPI  Objective: BP 128/77   Pulse 81   Temp (!) 96.7 F (35.9 C) (Oral)   Ht 5\' 4"  (1.626 m)   Wt 238 lb 6.4 oz (108.1 kg)   BMI 40.92 kg/m  Gen: NAD, alert, cooperative with exam HEENT: NCAT CV: RRR, good S1/S2, no murmur Resp: CTABL, no wheezes, non-labored Ext: No edema, warm Neuro: Alert and oriented, No gross deficits  MSK: L knee without erythema, effusion, bruising, or gross deformity + joint line tenderness.  ligamentously intact to Lachman's and with varus and valgus stress.  Negative McMurray's test  Assessment and plan:  # Left knee pain Acute knee pain Continue steroids plus tramadol Trial of Voltaren instead of indomethacin. Refer to orthopedics.   Meds ordered this encounter  Medications  . diclofenac (VOLTAREN) 75 MG EC tablet    Sig: Take 1 tablet (75 mg total) by mouth 2 (two) times daily.    Dispense:  30 tablet    Refill:  0    Laroy Apple, MD [facility] 05/14/2017, 2:26 PM

## 2017-05-14 NOTE — Patient Instructions (Signed)
Great to see you!  Continue tramadol as needed and prednisone as instructed.   Add voltaren 1 pill twice daily.

## 2017-05-17 ENCOUNTER — Ambulatory Visit (INDEPENDENT_AMBULATORY_CARE_PROVIDER_SITE_OTHER): Admitting: "Endocrinology

## 2017-05-17 ENCOUNTER — Encounter: Payer: Self-pay | Admitting: "Endocrinology

## 2017-05-17 VITALS — BP 118/82 | HR 64 | Ht 64.0 in | Wt 238.0 lb

## 2017-05-17 DIAGNOSIS — E042 Nontoxic multinodular goiter: Secondary | ICD-10-CM

## 2017-05-17 NOTE — Progress Notes (Signed)
Subjective:    Patient ID: Miranda Perkins, female    DOB: 17-Mar-1961, PCP Eustaquio Maize, MD   Past Medical History:  Diagnosis Date  . Cancer ([place]) 2016   basal cell, forehead  . 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 [place]  . MOUTH SURGERY    . skin cancer removal    . TUBAL LIGATION     Social History   Social History  . Marital status: Married    Spouse name: N/A  . Number of children: 6  . Years of education: N/A   Occupational History  . 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 Smoking status: Former Smoker    Packs/day: 0.50    Types: Cigarettes    Quit date: 11/26/2009  . Smokeless tobacco: Never Used  . Alcohol use No     Comment: maybe 2 drinks a year  . Drug use: No  . Sexual activity: Not Asked   Other Topics Concern  . None   Social History Narrative   Lives with husband. Raising 2 grandchildren of deceased daughter.       Lives in one story ranch house.    Outpatient Encounter Prescriptions as of 05/17/2017  Medication Sig  . diclofenac (VOLTAREN) 75 MG EC tablet Take 1 tablet (75 mg total) by mouth 2 (two) times daily.  . predniSONE (STERAPRED UNI-PAK 21 TAB) 10 MG (21) TBPK tablet As directed x 6 days  . traMADol (ULTRAM) 50 MG tablet Take 1-2 tablets (50-100 mg total) by mouth every 12 (twelve) hours as needed.   No facility-administered encounter medications on file as of 05/17/2017.    ALLERGIES: Allergies  Allergen Reactions  . Influenza Vaccines Anaphylaxis    Per patient  . Penicillins Anaphylaxis  . Cortisone Hives   VACCINATION STATUS:  There is no immunization history on file for this patient.  HPI 56 yr old female with above medical hx. She is Here to follow-up for her history of multinodular goiter With repeat thyroid function  tests -Her repeat thyroid ultrasound is largely unchanged from her prior studies- Showing 1.7 and 1.1 cm nodules on the right lobe of the thyroid. -Ultrasound reports from 2015 and 16 have been  scanned in her records.  -Fine-needle aspiration from November 2015 in [place]  showed benign nodular goiter bethesda system category  II with no cytologic evidence of malignancy.   She is not on thyroid hormones nor is she on antithyroid meds. She denies exposure to neck radiation. She denies dysphagia, SOB, voice change. She denies family hx of thyroid cancer. She has no new complaints today.  Review of Systems Constitutional: no weight gain/loss, no fatigue, no subjective hyperthermia/hypothermia Eyes: no blurry vision, no xerophthalmia ENT: no sore throat, no nodules palpated in throat, no dysphagia/odynophagia, no hoarseness Cardiovascular: no CP/SOB/palpitations/leg swelling Respiratory: no cough/SOB Gastrointestinal: no N/V/D/C Musculoskeletal: no muscle/joint aches Skin: no rashes Neurological: no tremors/numbness/tingling/dizziness Psychiatric: no depression/anxiety  Objective:    BP 118/82   Pulse 64   Ht 5\' 4"  (1.626 m)   Wt 238 lb (108 kg)   BMI 40.85 kg/m   Wt Readings from Last 3 Encounters:  05/17/17 238 lb (108 kg)  05/14/17 238  lb 6.4 oz (108.1 kg)  05/03/17 238 lb 3.2 oz (108 kg)    Physical Exam Constitutional: overweight, in NAD Eyes: PERRLA, EOMI, no exophthalmos ENT: moist mucous membranes, no thyromegaly, no cervical lymphadenopathy Cardiovascular: RRR, No MRG Respiratory: CTA B Gastrointestinal: abdomen soft, NT, ND, BS+ Musculoskeletal: no deformities, strength intact in all 4 Skin: moist, warm, no rashes Neurological: no tremor with outstretched hands, DTR normal in all 4  Labs from 11/27/2015 showed TSH and free T4 within normal range.  Thyroid ultrasound from Mar 27, 2015 from [place] was reviewed . Largest nodule on right lobe  1.7 cms .  Recent Results (from the past 2160 hour(s))  T4, Free     Status: None   Collection Time: 03/31/17 12:00 AM  Result Value Ref Range   Free T4 1.25 0.82 - 1.77 ng/dL  TSH     Status: None   Collection Time: 04/15/17 12:00 AM  Result Value Ref Range   TSH 2.520 0.450 - 4.500 uIU/mL  Please Note     Status: None   Collection Time: 04/15/17 12:00 AM  Result Value Ref Range   Please note Comment     Comment: The date and/or time of collection was not indicated on the requisition as required by state and federal law.  The date of receipt of the specimen was used as the collection date if not supplied.   Uric Acid     Status: Abnormal   Collection Time: 05/03/17  3:58 PM  Result Value Ref Range   Uric Acid 8.8 (H) 2.5 - 7.1 mg/dL    Comment:            Therapeutic target for gout patients: <6.0  Sedimentation Rate     Status: None   Collection Time: 05/03/17  3:58 PM  Result Value Ref Range   Sed Rate 3 0 - 40 mm/hr  Basic Metabolic Panel     Status: Abnormal   Collection Time: 05/03/17  3:58 PM  Result Value Ref Range   Glucose 84 65 - 99 mg/dL   BUN 21 6 - 24 mg/dL   Creatinine, Ser 0.88 0.57 - 1.00 mg/dL   GFR calc non Af Amer 73 >59 mL/min/1.73   GFR calc Af Amer 84 >59 mL/min/1.73   BUN/Creatinine Ratio 24 (H) 9 - 23   Sodium 142 134 - 144 mmol/L   Potassium 4.7 3.5 - 5.2 mmol/L   Chloride 105 96 - 106 mmol/L   CO2 19 (L) 20 - 29 mmol/L    Comment:               **Please note reference interval change**   Calcium 9.7 8.7 - 10.2 mg/dL     Her repeat thyroid ultrasound on 03/26/2016 at [facility] radiology showed unchanged sonogram of the thyroid with 1.7 and 1.1 cm nodules.  Fine-needle aspiration on 09/28/2014 on her right lobe nodule   in [facility] which showed abundant normal follicular cells and a few clusters of intact normal follicles, a small component of lymphocytes mixed with isolated Hurthle cells.  Assessment & Plan:   1.  Multiple thyroid nodules/Nontoxic MNG  -I have reviewed her available records on thyroid. She has hx of   Benign nontoxic MNG.  She would not need antithyroid intervention for now. She is clinically euthyroid.  -She will have repeat thyroid function test and thyroid ultrasound  in 1 year. - I advised patient to maintain close follow up with Evette Doffing,  [place]lin Hun, MD for primary care needs. Follow up plan: Return in about 1 year (around 05/17/2018) for follow up with pre-visit labs, Thyroid / Neck Ultrasound.  Glade Lloyd, MD Phone: [phone]  Fax: [phone]   05/17/2017, 12:02 PM

## 2017-05-18 ENCOUNTER — Other Ambulatory Visit (HOSPITAL_COMMUNITY): Payer: Self-pay | Admitting: Orthopedic Surgery

## 2017-05-18 DIAGNOSIS — G8929 Other chronic pain: Secondary | ICD-10-CM

## 2017-05-18 DIAGNOSIS — M25562 Pain in left knee: Principal | ICD-10-CM

## 2017-05-21 ENCOUNTER — Ambulatory Visit (HOSPITAL_COMMUNITY)
Admission: RE | Admit: 2017-05-21 | Discharge: 2017-05-21 | Disposition: A | Discharge status: Home / Self Care | Source: Ambulatory Visit | Attending: Orthopedic Surgery | Admitting: Orthopedic Surgery

## 2017-05-21 DIAGNOSIS — S83242A Other tear of medial meniscus, current injury, left knee, initial encounter: Secondary | ICD-10-CM | POA: Insufficient documentation

## 2017-05-21 DIAGNOSIS — M7122 Synovial cyst of popliteal space [Baker], left knee: Secondary | ICD-10-CM | POA: Diagnosis not present

## 2017-05-21 DIAGNOSIS — M1712 Unilateral primary osteoarthritis, left knee: Secondary | ICD-10-CM | POA: Insufficient documentation

## 2017-05-21 DIAGNOSIS — X58XXXA Exposure to other specified factors, initial encounter: Secondary | ICD-10-CM | POA: Insufficient documentation

## 2017-05-21 DIAGNOSIS — M25562 Pain in left knee: Secondary | ICD-10-CM | POA: Diagnosis not present

## 2017-05-21 DIAGNOSIS — G8929 Other chronic pain: Secondary | ICD-10-CM

## 2017-06-15 ENCOUNTER — Ambulatory Visit: Admitting: Neurology

## 2017-06-21 HISTORY — PX: KNEE ARTHROSCOPY W/ MENISCAL REPAIR: SHX1877

## 2017-06-21 HISTORY — PX: KNEE SURGERY: SHX244

## 2017-07-14 ENCOUNTER — Ambulatory Visit: Admitting: Pediatrics

## 2017-08-03 ENCOUNTER — Ambulatory Visit (INDEPENDENT_AMBULATORY_CARE_PROVIDER_SITE_OTHER): Admitting: Neurology

## 2017-08-03 ENCOUNTER — Encounter: Payer: Self-pay | Admitting: Neurology

## 2017-08-03 VITALS — BP 100/70 | HR 78 | Ht 65.0 in | Wt 230.2 lb

## 2017-08-03 DIAGNOSIS — F0781 Postconcussional syndrome: Secondary | ICD-10-CM | POA: Diagnosis not present

## 2017-08-03 NOTE — Patient Instructions (Signed)
Wishing you and your husband well! Continue to monitor headaches, if needing more Motrin than 2-3 times a week, starting a daily headache preventative medication would be more helpful. Follow-up in 6 months, call for any changes.
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
## 2017-08-11 ENCOUNTER — Telehealth: Payer: Self-pay | Admitting: Pediatrics

## 2017-08-11 ENCOUNTER — Ambulatory Visit (INDEPENDENT_AMBULATORY_CARE_PROVIDER_SITE_OTHER): Admitting: Pediatrics

## 2017-08-11 ENCOUNTER — Encounter: Payer: Self-pay | Admitting: Pediatrics

## 2017-08-11 VITALS — BP 119/83 | HR 71 | Temp 97.1°F | Ht 65.0 in | Wt 232.6 lb

## 2017-08-11 DIAGNOSIS — G8929 Other chronic pain: Secondary | ICD-10-CM

## 2017-08-11 DIAGNOSIS — F32A Depression, unspecified: Secondary | ICD-10-CM

## 2017-08-11 DIAGNOSIS — F329 Major depressive disorder, single episode, unspecified: Secondary | ICD-10-CM | POA: Diagnosis not present

## 2017-08-11 DIAGNOSIS — L259 Unspecified contact dermatitis, unspecified cause: Secondary | ICD-10-CM

## 2017-08-11 DIAGNOSIS — M25562 Pain in left knee: Secondary | ICD-10-CM

## 2017-08-11 DIAGNOSIS — L719 Rosacea, unspecified: Secondary | ICD-10-CM | POA: Diagnosis not present

## 2017-08-11 MED ORDER — DOXYCYCLINE HYCLATE 50 MG PO CAPS: 50.0000 mg | ORAL_CAPSULE | Freq: Every day | ORAL | 3 refills | Status: DC

## 2017-08-11 MED ORDER — TRIAMCINOLONE ACETONIDE 0.1 % EX OINT: 1.0000 "application " | TOPICAL_OINTMENT | Freq: Two times a day (BID) | CUTANEOUS | 0 refills | Status: DC

## 2017-08-11 MED ORDER — MELOXICAM 7.5 MG PO TABS: 7.5000 mg | ORAL_TABLET | Freq: Every day | ORAL | 3 refills | Status: DC

## 2017-08-11 NOTE — Telephone Encounter (Signed)
LMOVM that Rxs have been sent to [facility]

## 2017-08-11 NOTE — Progress Notes (Signed)
  Subjective:   Patient ID: Miranda Perkins, female    DOB: [birth year], 56 y.o.   MRN: [id number] CC: Depression; Rosacea; and Abrasion (cracking around fingers)  HPI: Miranda Perkins is a 56 y.o. female presenting for Depression; Rosacea; and Abrasion (cracking around fingers)  Has not yet had neck surgery, carpal tunnel surgery Needs knee replacement at some point  Knee bothering her the most day in and out Stopped NSAIDs last week Has had increased swelling in knee  Depression: mood has been ok Not on medication now Tried counselor with boys, was not helpful so they stopped  Post-concussion syndrome: following with neurology HA improving  Rosacea flaring, previously on doxycycline with better control than now  R hand 1-3 fingers with patches of lichenification, cracking Has cracked to point of bleeding before She isnt sure what caused it Is L handed Cant thnk of any exposures unique to R hand  Relevant past medical, surgical, family and social history reviewed. Allergies and medications reviewed and updated. History  Smoking Status  . Former Smoker  . Packs/day: 0.50  . Types: Cigarettes  . Quit date: 11/26/2009  Smokeless Tobacco  . Never Used   ROS: Per HPI   Objective:    BP 119/83   Pulse 71   Temp (!) 97.1 F (36.2 C) (Oral)   Ht 5\' 5"  (1.651 m)   Wt 232 lb 9.6 oz (105.5 kg)   BMI 38.71 kg/m   Wt Readings from Last 3 Encounters:  08/11/17 232 lb 9.6 oz (105.5 kg)  08/03/17 230 lb 3 oz (104.4 kg)  05/17/17 238 lb (108 kg)    Gen: NAD, alert, cooperative with exam, NCAT EYES: EOMI, no conjunctival injection, or no icterus ENT:  TMs pearly gray b/l, OP without erythema LYMPH: no cervical LAD CV: NRRR, normal S1/S2, no murmur, distal pulses 2+ b/l Resp: CTABL, no wheezes, normal WOB Abd: +BS, soft, NTND. no guarding or organomegaly Ext: No edema, warm Neuro: Alert and oriented, strength equal b/l UE and LE, coordination grossly normal MSK: L knee  with slight effusion, no redness Skin: 1-3 fingers R hand with lichenification, superficial cracking in several patches Psych: normal affect No thoughts of self harm  Assessment & Plan:  Miranda Perkins was seen today for depression, rosacea and abrasion.  Diagnoses and all orders for this visit:  Depression, unspecified depression type Stable, mood still down at times Lots of stress with work,husbands health, guardian of 12yo grandsons Gave list of counselors in area, encouraged counseling  Chronic pain of left knee Due to OA NSAIDs, rest -      meloxicam (MOBIC) 7.5 MG tablet; Take 1 tablet (7.5 mg total) by mouth daily.  Contact dermatitis, unspecified contact dermatitis type, unspecified trigger Start below, avoid any exacerbating contacts -     : triamcinolone ointment (KENALOG) 0.1 %; Apply 1 application topically 2 (two) times daily.  Rosacea Start below -      doxycycline (VIBRAMYCIN) 50 MG capsule; Take 1 capsule (50 mg total) by mouth daily.   Follow up plan: Return in about 3 months (around 11/11/2017). Assunta Found, MD [place]

## 2017-08-11 NOTE — Patient Instructions (Signed)
[email or web address]  Your provider wants you to schedule an appointment with a Psychologist/Psychiatrist. The following list of offices requires the patient to call and make their own appointment, as there is information they need that only you can provide. Please feel free to choose form the following providers:  [facility]   [phone] Crisis Recovery in [place] [phone]  [facility]  [phone]   [address] [place], [place]  (Scheduled through [facility]) Must call and do an interview for appointment. Sees Children / Accepts Medicaid  [facility]    [phone]  [address], [address]    [place], [place]       [facility]  [phone] [address] [place], [place]  Evaluates for Autism but does not treat it Sees Children / Accepts Medicaid  [facility]    [phone] [address]   [place], [place] Medication management, substance abuse, bipolar, grief, family, marriage, OCD, anxiety, PTSD Sees children / Accepts Medicaid  [facility]    [phone] [address] [place] Sees children / Accepts Medicaid  [facility]  [phone] [address] [place], [place]   Dr Akinlayo     [phone] [address] [place], [place]  Sees ADD & ADHD for treatment Accepts Medicaid  [facility]  [phone] [address] [place], [place] Evaluates for Autism Accepts Medicaid  [facility]  [phone] [address] [place], [place]  Does Adult ADD evaluations Does not accept Medicaid  [facility]   [phone] [address]   [place], [place] Uses animal therapy  Sees children as young as 3 years old Accepts Medicaid  [facility]     [phone]    [address]  [place], [place] [address] Sees children Accepts Medicaid   

## 2017-08-16 ENCOUNTER — Telehealth: Payer: Self-pay | Admitting: Pediatrics

## 2017-08-24 ENCOUNTER — Encounter: Payer: Self-pay | Admitting: Pediatrics

## 2017-08-27 ENCOUNTER — Other Ambulatory Visit: Payer: Self-pay | Admitting: *Deleted

## 2017-08-27 DIAGNOSIS — L719 Rosacea, unspecified: Secondary | ICD-10-CM

## 2017-08-27 MED ORDER — DOXYCYCLINE HYCLATE 50 MG PO CAPS: 50.0000 mg | ORAL_CAPSULE | Freq: Every day | ORAL | 0 refills | Status: DC

## 2017-09-08 ENCOUNTER — Other Ambulatory Visit: Payer: Self-pay | Admitting: Neurosurgery

## 2017-09-10 ENCOUNTER — Other Ambulatory Visit: Payer: Self-pay | Admitting: *Deleted

## 2017-09-10 DIAGNOSIS — L719 Rosacea, unspecified: Secondary | ICD-10-CM

## 2017-09-10 MED ORDER — DOXYCYCLINE HYCLATE 50 MG PO CAPS: 50.0000 mg | ORAL_CAPSULE | Freq: Every day | ORAL | 0 refills | Status: DC

## 2017-09-14 ENCOUNTER — Other Ambulatory Visit: Payer: Self-pay | Admitting: *Deleted

## 2017-09-14 DIAGNOSIS — M25562 Pain in left knee: Principal | ICD-10-CM

## 2017-09-14 DIAGNOSIS — G8929 Other chronic pain: Secondary | ICD-10-CM

## 2017-09-14 MED ORDER — MELOXICAM 7.5 MG PO TABS: 7.5000 mg | ORAL_TABLET | Freq: Every day | ORAL | 0 refills | Status: DC

## 2017-09-15 NOTE — Pre-Procedure Instructions (Signed)
Miranda Perkins  09/15/2017      [facility] [place], [address] [place] [address] Phone: [phone] Fax: [phone]    Your procedure is scheduled on Mon. Nov.26.  Report to [facility] Admitting at 9:20 A.M.  Call this number if you have problems the morning of surgery:  [phone]   Remember:  Do not eat food or drink liquids after midnight on Sun. Nov. 25   Take these medicines the morning of surgery with A SIP OF WATER :none              7 days prior to surgery STOP taking any Aspirin(unless otherwise instructed by your surgeon), Aleve, Naproxen, Ibuprofen, Motrin, Advil, Goody's, BC's, all herbal medications, fish oil, and all vitamins   Do not wear jewelry, make-up or nail polish.  Do not wear lotions, powders, or perfumes, or deoderant.  Do not shave 48 hours prior to surgery.  Men may shave face and neck.  Do not bring valuables to the hospital.  [facility] is not responsible for any belongings or valuables.  Contacts, dentures or bridgework may not be worn into surgery.  Leave your suitcase in the car.  After surgery it may be brought to your room.  For patients admitted to the hospital, discharge time will be determined by your treatment team.  Patients discharged the day of surgery will not be allowed to drive home.    Special instructions:   [place]- Preparing For Surgery  Before surgery, you can play an important role. Because skin is not sterile, your skin needs to be as free of germs as possible. You can reduce the number of germs on your skin by washing with CHG (chlorahexidine gluconate) Soap before surgery.  CHG is an antiseptic cleaner which kills germs and bonds with the skin to continue killing germs even after washing.  Please do not use if you have an allergy to CHG or antibacterial soaps. If your skin becomes reddened/irritated stop using the CHG.  Do not shave (including legs  and underarms) for at least 48 hours prior to first CHG shower. It is OK to shave your face.  Please follow these instructions carefully.   1. Shower the NIGHT BEFORE SURGERY and the MORNING OF SURGERY with CHG.   2. If you chose to wash your hair, wash your hair first as usual with your normal shampoo.  3. After you shampoo, rinse your hair and body thoroughly to remove the shampoo.  4. Use CHG as you would any other liquid soap. You can apply CHG directly to the skin and wash gently with a scrungie or a clean washcloth.   5. Apply the CHG Soap to your body ONLY FROM THE NECK DOWN.  Do not use on open wounds or open sores. Avoid contact with your eyes, ears, mouth and genitals (private parts). Wash Face and genitals (private parts)  with your normal soap.  6. Wash thoroughly, paying special attention to the area where your surgery will be performed.  7. Thoroughly rinse your body with warm water from the neck down.  8. DO NOT shower/wash with your normal soap after using and rinsing off the CHG Soap.  9. Pat yourself dry with a CLEAN TOWEL.  10. Wear CLEAN PAJAMAS to bed the night before surgery, wear comfortable clothes the morning of surgery  11. Place CLEAN SHEETS on your bed the night of your first  shower and DO NOT SLEEP WITH PETS.    Day of Surgery: Do not apply any deodorants/lotions. Please wear clean clothes to the hospital/surgery center.      Please read over the following fact sheets that you were given. Coughing and Deep Breathing, MRSA Information and Surgical Site Infection Prevention

## 2017-09-16 ENCOUNTER — Other Ambulatory Visit: Payer: Self-pay

## 2017-09-16 ENCOUNTER — Encounter (HOSPITAL_COMMUNITY): Payer: Self-pay

## 2017-09-16 ENCOUNTER — Encounter (HOSPITAL_COMMUNITY)
Admission: RE | Admit: 2017-09-16 | Discharge: 2017-09-16 | Disposition: A | Discharge status: Home / Self Care | Source: Ambulatory Visit | Attending: Neurosurgery | Admitting: Neurosurgery

## 2017-09-16 DIAGNOSIS — Z0183 Encounter for blood typing: Secondary | ICD-10-CM | POA: Diagnosis not present

## 2017-09-16 DIAGNOSIS — Z01812 Encounter for preprocedural laboratory examination: Secondary | ICD-10-CM | POA: Diagnosis not present

## 2017-09-16 HISTORY — DX: Major depressive disorder, single episode, unspecified: F32.9

## 2017-09-16 HISTORY — DX: Depression, unspecified: F32.A

## 2017-09-16 LAB — CBC
HEMATOCRIT: 42.3 % (ref 36.0–46.0)
Hemoglobin: 14 g/dL (ref 12.0–15.0)
MCH: 28.6 pg (ref 26.0–34.0)
MCHC: 33.1 g/dL (ref 30.0–36.0)
MCV: 86.5 fL (ref 78.0–100.0)
Platelets: 272 10*3/uL (ref 150–400)
RBC: 4.89 MIL/uL (ref 3.87–5.11)
RDW: 13.7 % (ref 11.5–15.5)
WBC: 8.1 10*3/uL (ref 4.0–10.5)

## 2017-09-16 LAB — TYPE AND SCREEN
ABO/RH(D): O POS
Antibody Screen: NEGATIVE

## 2017-09-16 LAB — SURGICAL PCR SCREEN
MRSA, PCR: NEGATIVE
STAPHYLOCOCCUS AUREUS: NEGATIVE

## 2017-09-16 LAB — ABO/RH: ABO/RH(D): O POS

## 2017-09-16 NOTE — Progress Notes (Signed)
PCP - Dr. Evette Doffing Cardiologist - patient denies  Chest x-ray - n/a EKG - n/a Stress Test - patient denies ECHO - patient denies Cardiac Cath - patient denies  Sleep Study - patient denies   Patient denies shortness of breath, fever, cough and chest pain at PAT appointment   Patient verbalized understanding of instructions that were given to them at the PAT appointment. Patient was also instructed that they will need to review over the PAT instructions again at home before surgery.

## 2017-09-24 MED ORDER — VANCOMYCIN HCL 10 G IV SOLR
1500.0000 mg | INTRAVENOUS | Status: AC
  Administered 2017-09-27: 1500 mg via INTRAVENOUS
  Filled 2017-09-24: qty 1500

## 2017-09-27 ENCOUNTER — Other Ambulatory Visit: Payer: Self-pay

## 2017-09-27 ENCOUNTER — Inpatient Hospital Stay (HOSPITAL_COMMUNITY)

## 2017-09-27 ENCOUNTER — Inpatient Hospital Stay (HOSPITAL_COMMUNITY): Admitting: Anesthesiology

## 2017-09-27 ENCOUNTER — Encounter (HOSPITAL_COMMUNITY): Payer: Self-pay | Admitting: Certified Registered"

## 2017-09-27 ENCOUNTER — Encounter (HOSPITAL_COMMUNITY)
Admission: RE | Disposition: A | Discharge status: Home / Self Care | Payer: Self-pay | Source: Ambulatory Visit | Attending: Neurosurgery

## 2017-09-27 ENCOUNTER — Inpatient Hospital Stay (HOSPITAL_COMMUNITY)
Admission: RE | Admit: 2017-09-27 | Discharge: 2017-09-28 | DRG: 473 | Disposition: A | Discharge status: Home / Self Care | Source: Ambulatory Visit | Attending: Neurosurgery | Admitting: Neurosurgery

## 2017-09-27 DIAGNOSIS — Z87891 Personal history of nicotine dependence: Secondary | ICD-10-CM

## 2017-09-27 DIAGNOSIS — F329 Major depressive disorder, single episode, unspecified: Secondary | ICD-10-CM | POA: Diagnosis present

## 2017-09-27 DIAGNOSIS — M4802 Spinal stenosis, cervical region: Principal | ICD-10-CM | POA: Diagnosis present

## 2017-09-27 DIAGNOSIS — Z8673 Personal history of transient ischemic attack (TIA), and cerebral infarction without residual deficits: Secondary | ICD-10-CM

## 2017-09-27 DIAGNOSIS — Z419 Encounter for procedure for purposes other than remedying health state, unspecified: Secondary | ICD-10-CM

## 2017-09-27 DIAGNOSIS — Z85818 Personal history of malignant neoplasm of other sites of lip, oral cavity, and pharynx: Secondary | ICD-10-CM

## 2017-09-27 DIAGNOSIS — Z85828 Personal history of other malignant neoplasm of skin: Secondary | ICD-10-CM

## 2017-09-27 DIAGNOSIS — Z79899 Other long term (current) drug therapy: Secondary | ICD-10-CM | POA: Diagnosis not present

## 2017-09-27 DIAGNOSIS — G5602 Carpal tunnel syndrome, left upper limb: Secondary | ICD-10-CM | POA: Diagnosis present

## 2017-09-27 DIAGNOSIS — M4722 Other spondylosis with radiculopathy, cervical region: Secondary | ICD-10-CM | POA: Diagnosis present

## 2017-09-27 DIAGNOSIS — Z880 Allergy status to penicillin: Secondary | ICD-10-CM | POA: Diagnosis not present

## 2017-09-27 DIAGNOSIS — Z6838 Body mass index (BMI) 38.0-38.9, adult: Secondary | ICD-10-CM

## 2017-09-27 HISTORY — PX: CARPAL TUNNEL RELEASE: SHX101

## 2017-09-27 HISTORY — PX: ANTERIOR CERVICAL DECOMP/DISCECTOMY FUSION: SHX1161

## 2017-09-27 HISTORY — DX: Carpal tunnel syndrome, unspecified upper limb: G56.00

## 2017-09-27 SURGERY — Surgical Case
Anesthesia: *Unknown

## 2017-09-27 SURGERY — ANTERIOR CERVICAL DECOMPRESSION/DISCECTOMY FUSION 3 LEVELS
Anesthesia: General

## 2017-09-27 MED ORDER — ACETAMINOPHEN 325 MG PO TABS: 650.0000 mg | ORAL_TABLET | ORAL | Status: DC | PRN

## 2017-09-27 MED ORDER — DEXAMETHASONE SODIUM PHOSPHATE 10 MG/ML IJ SOLN
INTRAMUSCULAR | Status: DC | PRN
  Administered 2017-09-27: 10 mg via INTRAVENOUS

## 2017-09-27 MED ORDER — THROMBIN (RECOMBINANT) 5000 UNITS EX SOLR
OROMUCOSAL | Status: DC | PRN
  Administered 2017-09-27: 13:00:00 via TOPICAL

## 2017-09-27 MED ORDER — MENTHOL 3 MG MT LOZG: 1.0000 | LOZENGE | OROMUCOSAL | Status: DC | PRN

## 2017-09-27 MED ORDER — LIDOCAINE 2% (20 MG/ML) 5 ML SYRINGE
INTRAMUSCULAR | Status: AC
  Filled 2017-09-27: qty 5

## 2017-09-27 MED ORDER — THROMBIN (RECOMBINANT) 5000 UNITS EX SOLR
CUTANEOUS | Status: AC
  Filled 2017-09-27: qty 5000

## 2017-09-27 MED ORDER — EPHEDRINE 5 MG/ML INJ
INTRAVENOUS | Status: AC
  Filled 2017-09-27: qty 10

## 2017-09-27 MED ORDER — FENTANYL CITRATE (PF) 250 MCG/5ML IJ SOLN
INTRAMUSCULAR | Status: AC
  Filled 2017-09-27: qty 5

## 2017-09-27 MED ORDER — SUGAMMADEX SODIUM 200 MG/2ML IV SOLN
INTRAVENOUS | Status: DC | PRN
  Administered 2017-09-27: 200 mg via INTRAVENOUS

## 2017-09-27 MED ORDER — SODIUM CHLORIDE 0.9 % IV SOLN: 250.0000 mL | INTRAVENOUS | Status: DC

## 2017-09-27 MED ORDER — MIDAZOLAM HCL 2 MG/2ML IJ SOLN
INTRAMUSCULAR | Status: AC
  Filled 2017-09-27: qty 2

## 2017-09-27 MED ORDER — VANCOMYCIN HCL IN DEXTROSE 1-5 GM/200ML-% IV SOLN
1000.0000 mg | Freq: Two times a day (BID) | INTRAVENOUS | Status: AC
  Administered 2017-09-27 – 2017-09-28 (×2): 1000 mg via INTRAVENOUS
  Filled 2017-09-27 (×2): qty 200

## 2017-09-27 MED ORDER — BACITRACIN ZINC 500 UNIT/GM EX OINT
TOPICAL_OINTMENT | CUTANEOUS | Status: DC | PRN
  Administered 2017-09-27: 1 via TOPICAL

## 2017-09-27 MED ORDER — PANTOPRAZOLE SODIUM 40 MG PO TBEC
40.0000 mg | DELAYED_RELEASE_TABLET | Freq: Every day | ORAL | Status: DC
  Administered 2017-09-27: 40 mg via ORAL

## 2017-09-27 MED ORDER — SODIUM CHLORIDE 0.9% FLUSH: 3.0000 mL | Freq: Two times a day (BID) | INTRAVENOUS | Status: DC

## 2017-09-27 MED ORDER — LACTATED RINGERS IV SOLN
INTRAVENOUS | Status: DC
  Administered 2017-09-27 (×2): via INTRAVENOUS

## 2017-09-27 MED ORDER — THROMBIN (RECOMBINANT) 5000 UNITS EX SOLR
CUTANEOUS | Status: DC | PRN
  Administered 2017-09-27: 5000 [IU] via TOPICAL

## 2017-09-27 MED ORDER — DOXYCYCLINE HYCLATE 50 MG PO CAPS
50.0000 mg | ORAL_CAPSULE | Freq: Every day | ORAL | Status: DC
  Administered 2017-09-27 – 2017-09-28 (×2): 50 mg via ORAL
  Filled 2017-09-27 (×2): qty 1

## 2017-09-27 MED ORDER — ONDANSETRON HCL 4 MG PO TABS: 4.0000 mg | ORAL_TABLET | Freq: Four times a day (QID) | ORAL | Status: DC | PRN

## 2017-09-27 MED ORDER — CYCLOBENZAPRINE HCL 10 MG PO TABS
10.0000 mg | ORAL_TABLET | Freq: Three times a day (TID) | ORAL | Status: DC | PRN
  Administered 2017-09-27 – 2017-09-28 (×2): 10 mg via ORAL
  Filled 2017-09-27 (×2): qty 1

## 2017-09-27 MED ORDER — HEMOSTATIC AGENTS (NO CHARGE) OPTIME
TOPICAL | Status: DC | PRN
  Administered 2017-09-27 (×2): 1 via TOPICAL

## 2017-09-27 MED ORDER — OXYCODONE HCL 5 MG PO TABS
10.0000 mg | ORAL_TABLET | ORAL | Status: DC | PRN
  Administered 2017-09-27 – 2017-09-28 (×4): 10 mg via ORAL
  Filled 2017-09-27 (×4): qty 2

## 2017-09-27 MED ORDER — SODIUM CHLORIDE 0.9 % IR SOLN
Status: DC | PRN
  Administered 2017-09-27: 11:00:00

## 2017-09-27 MED ORDER — LIDOCAINE-EPINEPHRINE 1 %-1:100000 IJ SOLN
INTRAMUSCULAR | Status: DC | PRN
  Administered 2017-09-27: 4 mL

## 2017-09-27 MED ORDER — HYDROMORPHONE HCL 1 MG/ML IJ SOLN: 0.5000 mg | INTRAMUSCULAR | Status: DC | PRN

## 2017-09-27 MED ORDER — 0.9 % SODIUM CHLORIDE (POUR BTL) OPTIME
TOPICAL | Status: DC | PRN
  Administered 2017-09-27: 1000 mL

## 2017-09-27 MED ORDER — ONDANSETRON HCL 4 MG/2ML IJ SOLN: 4.0000 mg | Freq: Four times a day (QID) | INTRAMUSCULAR | Status: DC | PRN

## 2017-09-27 MED ORDER — MIDAZOLAM HCL 5 MG/5ML IJ SOLN
INTRAMUSCULAR | Status: DC | PRN
  Administered 2017-09-27: 2 mg via INTRAVENOUS

## 2017-09-27 MED ORDER — ONDANSETRON HCL 4 MG/2ML IJ SOLN
INTRAMUSCULAR | Status: DC | PRN
  Administered 2017-09-27: 4 mg via INTRAVENOUS

## 2017-09-27 MED ORDER — PROPOFOL 10 MG/ML IV BOLUS
INTRAVENOUS | Status: DC | PRN
  Administered 2017-09-27: 170 mg via INTRAVENOUS

## 2017-09-27 MED ORDER — PHENOL 1.4 % MT LIQD: 1.0000 | OROMUCOSAL | Status: DC | PRN

## 2017-09-27 MED ORDER — LIDOCAINE 2% (20 MG/ML) 5 ML SYRINGE
INTRAMUSCULAR | Status: DC | PRN
  Administered 2017-09-27: 80 mg via INTRAVENOUS

## 2017-09-27 MED ORDER — LIDOCAINE-EPINEPHRINE 1 %-1:100000 IJ SOLN
INTRAMUSCULAR | Status: AC
  Filled 2017-09-27: qty 1

## 2017-09-27 MED ORDER — SCOPOLAMINE 1 MG/3DAYS TD PT72
MEDICATED_PATCH | TRANSDERMAL | Status: DC | PRN
  Administered 2017-09-27: 1 via TRANSDERMAL

## 2017-09-27 MED ORDER — PROPOFOL 10 MG/ML IV BOLUS
INTRAVENOUS | Status: AC
  Filled 2017-09-27: qty 20

## 2017-09-27 MED ORDER — FENTANYL CITRATE (PF) 100 MCG/2ML IJ SOLN: 25.0000 ug | INTRAMUSCULAR | Status: DC | PRN

## 2017-09-27 MED ORDER — ACETAMINOPHEN 650 MG RE SUPP: 650.0000 mg | RECTAL | Status: DC | PRN

## 2017-09-27 MED ORDER — ROCURONIUM BROMIDE 10 MG/ML (PF) SYRINGE
PREFILLED_SYRINGE | INTRAVENOUS | Status: DC | PRN
Start: 2017-09-27 — End: 2017-09-27
  Administered 2017-09-27: 60 mg via INTRAVENOUS

## 2017-09-27 MED ORDER — BACITRACIN ZINC 500 UNIT/GM EX OINT
TOPICAL_OINTMENT | CUTANEOUS | Status: AC
  Filled 2017-09-27: qty 28.35

## 2017-09-27 MED ORDER — CHLORHEXIDINE GLUCONATE CLOTH 2 % EX PADS: 6.0000 | MEDICATED_PAD | Freq: Once | CUTANEOUS | Status: DC

## 2017-09-27 MED ORDER — PANTOPRAZOLE SODIUM 40 MG IV SOLR: 40.0000 mg | Freq: Every day | INTRAVENOUS | Status: DC

## 2017-09-27 MED ORDER — SODIUM CHLORIDE 0.9% FLUSH: 3.0000 mL | INTRAVENOUS | Status: DC | PRN

## 2017-09-27 MED ORDER — ALUM & MAG HYDROXIDE-SIMETH 200-200-20 MG/5ML PO SUSP: 30.0000 mL | Freq: Four times a day (QID) | ORAL | Status: DC | PRN

## 2017-09-27 MED ORDER — VITAMIN D 1000 UNITS PO TABS
2000.0000 [IU] | ORAL_TABLET | Freq: Every day | ORAL | Status: DC
  Administered 2017-09-28: 2000 [IU] via ORAL
  Filled 2017-09-27: qty 2

## 2017-09-27 MED ORDER — FENTANYL CITRATE (PF) 250 MCG/5ML IJ SOLN
INTRAMUSCULAR | Status: DC | PRN
  Administered 2017-09-27 (×3): 50 ug via INTRAVENOUS
  Administered 2017-09-27: 100 ug via INTRAVENOUS

## 2017-09-27 MED ORDER — THROMBIN (RECOMBINANT) 20000 UNITS EX SOLR
CUTANEOUS | Status: DC | PRN
  Administered 2017-09-27: 20000 [IU] via TOPICAL

## 2017-09-27 MED ORDER — ROCURONIUM BROMIDE 10 MG/ML (PF) SYRINGE
PREFILLED_SYRINGE | INTRAVENOUS | Status: AC
  Filled 2017-09-27: qty 5

## 2017-09-27 MED ORDER — CEFAZOLIN SODIUM-DEXTROSE 2-4 GM/100ML-% IV SOLN: 2.0000 g | Freq: Three times a day (TID) | INTRAVENOUS | Status: DC

## 2017-09-27 MED ORDER — PROMETHAZINE HCL 25 MG/ML IJ SOLN: 6.2500 mg | INTRAMUSCULAR | Status: DC | PRN

## 2017-09-27 MED ORDER — KETOROLAC TROMETHAMINE 0.5 % OP SOLN
1.0000 [drp] | Freq: Three times a day (TID) | OPHTHALMIC | Status: DC | PRN
  Filled 2017-09-27: qty 3

## 2017-09-27 MED ORDER — THROMBIN (RECOMBINANT) 20000 UNITS EX SOLR
CUTANEOUS | Status: AC
  Filled 2017-09-27: qty 20000

## 2017-09-27 SURGICAL SUPPLY — 95 items
BAG DECANTER FOR FLEXI CONT (MISCELLANEOUS) ×3 IMPLANT
BANDAGE ACE 3X5.8 VEL STRL LF (GAUZE/BANDAGES/DRESSINGS) ×3 IMPLANT
BENZOIN TINCTURE PRP APPL 2/3 (GAUZE/BANDAGES/DRESSINGS) ×3 IMPLANT
BIT DRILL 13 (BIT) ×3 IMPLANT
BLADE SURG 15 STRL LF DISP TIS (BLADE) ×2 IMPLANT
BLADE SURG 15 STRL SS (BLADE) ×1
BNDG GAUZE ELAST 4 BULKY (GAUZE/BANDAGES/DRESSINGS) ×3 IMPLANT
BONE VIVIGEN FORMABLE 1.3CC (Bone Implant) ×3 IMPLANT
BUR MATCHSTICK NEURO 3.0 LAGG (BURR) ×3 IMPLANT
CABLE BIPOLOR RESECTION CORD (MISCELLANEOUS) ×3 IMPLANT
CANISTER SUCT 3000ML PPV (MISCELLANEOUS) ×6 IMPLANT
CARTRIDGE OIL MAESTRO DRILL (MISCELLANEOUS) ×4 IMPLANT
DECANTER SPIKE VIAL GLASS SM (MISCELLANEOUS) ×6 IMPLANT
DERMABOND ADVANCED (GAUZE/BANDAGES/DRESSINGS) ×1
DERMABOND ADVANCED .7 DNX12 (GAUZE/BANDAGES/DRESSINGS) ×2 IMPLANT
DIFFUSER DRILL AIR PNEUMATIC (MISCELLANEOUS) ×6 IMPLANT
DRAIN SNY WOU 7FLT (WOUND CARE) ×3 IMPLANT
DRAPE C-ARM 42X72 X-RAY (DRAPES) ×6 IMPLANT
DRAPE EXTREMITY T 121X128X90 (DRAPE) ×3 IMPLANT
DRAPE HALF SHEET 40X57 (DRAPES) ×3 IMPLANT
DRAPE LAPAROTOMY 100X72 PEDS (DRAPES) ×3 IMPLANT
DRAPE MICROSCOPE LEICA (MISCELLANEOUS) ×3 IMPLANT
DRAPE POUCH INSTRU U-SHP 10X18 (DRAPES) ×3 IMPLANT
DRSG ADAPTIC 3X8 NADH LF (GAUZE/BANDAGES/DRESSINGS) ×3 IMPLANT
DRSG OPSITE POSTOP 4X6 (GAUZE/BANDAGES/DRESSINGS) ×3 IMPLANT
DURAPREP 6ML APPLICATOR 50/CS (WOUND CARE) ×3 IMPLANT
ELECT COATED BLADE 2.86 ST (ELECTRODE) ×3 IMPLANT
ELECT REM PT RETURN 9FT ADLT (ELECTROSURGICAL) ×3
ELECTRODE REM PT RTRN 9FT ADLT (ELECTROSURGICAL) ×2 IMPLANT
EVACUATOR SILICONE 100CC (DRAIN) ×3 IMPLANT
GAUZE SPONGE 4X4 12PLY STRL (GAUZE/BANDAGES/DRESSINGS) ×3 IMPLANT
GAUZE SPONGE 4X4 16PLY XRAY LF (GAUZE/BANDAGES/DRESSINGS) ×3 IMPLANT
GLOVE BIO SURGEON STRL SZ 6.5 (GLOVE) IMPLANT
GLOVE BIO SURGEON STRL SZ7 (GLOVE) IMPLANT
GLOVE BIO SURGEON STRL SZ7.5 (GLOVE) IMPLANT
GLOVE BIO SURGEON STRL SZ8 (GLOVE) ×6 IMPLANT
GLOVE BIO SURGEON STRL SZ8.5 (GLOVE) IMPLANT
GLOVE BIOGEL M 8.0 STRL (GLOVE) IMPLANT
GLOVE BIOGEL PI IND STRL 7.0 (GLOVE) IMPLANT
GLOVE BIOGEL PI INDICATOR 7.0 (GLOVE)
GLOVE ECLIPSE 6.5 STRL STRAW (GLOVE) IMPLANT
GLOVE ECLIPSE 7.0 STRL STRAW (GLOVE) IMPLANT
GLOVE ECLIPSE 7.5 STRL STRAW (GLOVE) IMPLANT
GLOVE ECLIPSE 8.0 STRL XLNG CF (GLOVE) IMPLANT
GLOVE ECLIPSE 8.5 STRL (GLOVE) IMPLANT
GLOVE EXAM NITRILE LRG STRL (GLOVE) IMPLANT
GLOVE EXAM NITRILE XL STR (GLOVE) IMPLANT
GLOVE EXAM NITRILE XS STR PU (GLOVE) IMPLANT
GLOVE INDICATOR 6.5 STRL GRN (GLOVE) IMPLANT
GLOVE INDICATOR 7.0 STRL GRN (GLOVE) IMPLANT
GLOVE INDICATOR 7.5 STRL GRN (GLOVE) IMPLANT
GLOVE INDICATOR 8.0 STRL GRN (GLOVE) IMPLANT
GLOVE INDICATOR 8.5 STRL (GLOVE) ×6 IMPLANT
GLOVE OPTIFIT SS 8.0 STRL (GLOVE) IMPLANT
GLOVE SURG SS PI 6.5 STRL IVOR (GLOVE) IMPLANT
GOWN STRL REUS W/ TWL LRG LVL3 (GOWN DISPOSABLE) ×6 IMPLANT
GOWN STRL REUS W/ TWL XL LVL3 (GOWN DISPOSABLE) ×4 IMPLANT
GOWN STRL REUS W/TWL 2XL LVL3 (GOWN DISPOSABLE) ×3 IMPLANT
GOWN STRL REUS W/TWL LRG LVL3 (GOWN DISPOSABLE) ×3
GOWN STRL REUS W/TWL XL LVL3 (GOWN DISPOSABLE) ×2
HALTER HD/CHIN CERV TRACTION D (MISCELLANEOUS) ×3 IMPLANT
HAND ALUMI LG (SOFTGOODS) ×6 IMPLANT
HEMOSTAT POWDER KIT SURGIFOAM (HEMOSTASIS) ×6 IMPLANT
KIT BASIN OR (CUSTOM PROCEDURE TRAY) ×6 IMPLANT
KIT ROOM TURNOVER OR (KITS) ×3 IMPLANT
NEEDLE HYPO 25X1 1.5 SAFETY (NEEDLE) IMPLANT
NEEDLE SPNL 20GX3.5 QUINCKE YW (NEEDLE) ×3 IMPLANT
NS IRRIG 1000ML POUR BTL (IV SOLUTION) ×6 IMPLANT
OIL CARTRIDGE MAESTRO DRILL (MISCELLANEOUS) ×6
PACK LAMINECTOMY NEURO (CUSTOM PROCEDURE TRAY) ×3 IMPLANT
PACK SURGICAL SETUP 50X90 (CUSTOM PROCEDURE TRAY) ×3 IMPLANT
PAD ARMBOARD 7.5X6 YLW CONV (MISCELLANEOUS) ×6 IMPLANT
PIN DISTRACTION 14MM (PIN) IMPLANT
PLATE ANT CERVICAL 52.5 (Plate) ×3 IMPLANT
RUBBERBAND STERILE (MISCELLANEOUS) ×6 IMPLANT
SCREW ST FIX 4 ATL 3120213 (Screw) ×24 IMPLANT
SPACER CERVICAL 11X14MM 6MM 0D (Spacer) ×6 IMPLANT
SPACER CERVICAL 11X14MM 7MM 0D (Spacer) ×3 IMPLANT
SPONGE INTESTINAL PEANUT (DISPOSABLE) ×3 IMPLANT
SPONGE SURGIFOAM ABS GEL 100 (HEMOSTASIS) ×3 IMPLANT
STOCKINETTE 4X48 STRL (DRAPES) ×3 IMPLANT
STOCKINETTE 6  STRL (DRAPES) ×1
STOCKINETTE 6 STRL (DRAPES) ×2 IMPLANT
STRIP CLOSURE SKIN 1/2X4 (GAUZE/BANDAGES/DRESSINGS) ×3 IMPLANT
SUT ETHILON 3 0 PS 1 (SUTURE) ×6 IMPLANT
SUT VIC AB 3-0 SH 8-18 (SUTURE) ×6 IMPLANT
SUT VIC AB 4-0 PS2 27 (SUTURE) ×3 IMPLANT
SYR BULB 3OZ (MISCELLANEOUS) ×3 IMPLANT
SYR CONTROL 10ML LL (SYRINGE) IMPLANT
TAPE CLOTH 4X10 WHT NS (GAUZE/BANDAGES/DRESSINGS) ×3 IMPLANT
TOWEL GREEN STERILE (TOWEL DISPOSABLE) ×6 IMPLANT
TOWEL GREEN STERILE FF (TOWEL DISPOSABLE) ×6 IMPLANT
TRAP SPECIMEN MUCOUS 40CC (MISCELLANEOUS) ×3 IMPLANT
UNDERPAD 30X30 (UNDERPADS AND DIAPERS) IMPLANT
WATER STERILE IRR 1000ML POUR (IV SOLUTION) ×6 IMPLANT

## 2017-09-27 NOTE — Anesthesia Preprocedure Evaluation (Addendum)
Anesthesia Evaluation  Patient identified by MRN, date of birth, ID band Patient awake    Reviewed: Allergy & Precautions, NPO status , Patient's Chart, lab work & pertinent test results  Airway Mallampati: II  TM Distance: >3 FB Neck ROM: Full    Dental  (+) Dental Advisory Given, Missing,    Pulmonary neg sleep apnea, neg COPD, former smoker,    Pulmonary exam normal breath sounds clear to auscultation       Cardiovascular Exercise Tolerance: Good (-) Past MI negative cardio ROS Normal cardiovascular exam Rhythm:Regular Rate:Normal     Neuro/Psych Depression B/l upper extremity numbness and tingling CVA, No Residual Symptoms    GI/Hepatic negative GI ROS, Neg liver ROS, neg GERD  ,  Endo/Other  neg diabetesMorbid obesity  Renal/GU negative Renal ROS  negative genitourinary   Musculoskeletal negative musculoskeletal ROS (+)   Abdominal   Peds  Hematology negative hematology ROS (+)   Anesthesia Other Findings Basal cell ca skin; ANAPHYLAXIS TO PENICILLINS  Reproductive/Obstetrics                            Anesthesia Physical Anesthesia Plan  ASA: III  Anesthesia Plan: General   Post-op Pain Management:    Induction: Intravenous  PONV Risk Score and Plan: 4 or greater and Treatment may vary due to age or medical condition, Ondansetron, Dexamethasone, Midazolam and Scopolamine patch - Pre-op  Airway Management Planned: Oral ETT  Additional Equipment: None  Intra-op Plan:   Post-operative Plan: Extubation in OR  Informed Consent: I have reviewed the patients History and Physical, chart, labs and discussed the procedure including the risks, benefits and alternatives for the proposed anesthesia with the patient or authorized representative who has indicated his/her understanding and acceptance.   Dental advisory given  Plan Discussed with: CRNA  Anesthesia Plan Comments:          Anesthesia Quick Evaluation

## 2017-09-27 NOTE — Op Note (Addendum)
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   Procedure: #1 anterior cervical discectomy and fusion at C4-5 C5-6 C6-7 utilizing allograft wedges packed with vivigen and the Atlantis translational plating system with 8-13 mm fixed angle screws  #2 left carpal tunnel release  Surgeon: Dominica Severin Hammond Obeirne  Asst.: Granville Lewis  Anesthesia: Gen.  EBL: Minimal  History of present illness: 56 year old female with long-standing neck pain and bilateral arm pain worse the left workup revealed severe cervical spinal stenosis and spondylosis with severe foraminal stenosis at C4-5 C5-6 and C6-7. EMG also revealed severe median nerve entrapment at the wrist consistent with carpal tunnel syndrome. Due to patient's failed conservative treatment imaging findings and progressive clinical syndrome I recommended anterior cervical discectomies and fusion at those levels as well as a left carpal tunnel release. I extensively went over the risks and benefits of that operation with her as well as perioperative course expectations of outcome and alternatives of surgery and she understood and agreed to proceed forward.  Operative procedure: Patient brought into the or was just general anesthesia positioned supine the neck in slight extension in 5 pounds of halter traction. The right side of her neck was prepped And draped in routine sterile fashion preoperative x-ray localized the appropriate level so a curvilinear incision was made just off midline to the anterior border of the sternomastoid and superficial layer of the platysma was divided longitudinally and the avascular anterior sternomastoid and strap muscles was developed down to the previous fashion. Fascia dissected with Kitners. Interoperative x-ray confirmed indication appropriate level so annulotomy's were made in all 3 disc spaces anterior aspect of  did not of the 20 minute Kerrison punch self-retaining retractor was placed after the longus close reflected laterally first with attention given to C5-6 and C6-7. Both thesewere drilled down the posterior annulus facet complex and under Mike's cup illumination aggressive undergoing of both endplates allowed indication of the thecal sac there was a large spurs coming off both posterior endplates of X4-G8 and C7 these were all aggressively under bitten both C6 and C7 pedicles were identified and both C6 and C7 nerve roots, social pedicle. The most significant pathology was a large left-sided spur at C5-6. At the end of the foraminotomies all the nerve roots were easily visualized and decompressed and there is no further central stenosis. A 7 mm allograft wedges was inserted C6-7 and a 6 mm allograft was inserted C5-6 and the retractor was repositioned for C4-5. In a similar fashion C4-5 was drilled down there was some soft disc herniation here this is all removed decompress the central canal both C5 pedicles and pedicles were identified and both C5 nerve root decompressed. Then after a 6 room air allograft was inserted C4-5 and 52-1/2 mm Atlantis translational plate was selected all screws had excellent purchase locking mechanisms were engaged. Was a go see her get meticulous hemostasis was maintained the platysmas approximate with interrupted Vicryl skin was closed running 4 subcuticular Dermabond benzo and Steri-Strips and sterile dressing was applied and patient recovered in stable condition. At the end the case all needle counts sponge counts were correct. The patient was then repositioned for the carpal tunnel release.  The left hand was prepped and draped in routine sterile fashion after infiltration of 6 mL lidocaine with epi a midline incision was made from the distal crease the wrist along the palmar crease proximal 4 cm long. The transverse  carpal ligament was didn't dissected free and divided  sequentially in layers the epineurium of the median nerve is immediately visualized and I dissected and freed up the transverse carpal ligament freeing up the median nerve both proximally and distally. The wounds and copious irrigated meticulous hemostasis was maintained the skin was reapproximated with interrupted vertical mattress and the hand was dressed in Adaptic fluffs or lateral and Kling roll. At the end the case all needle counts sponge counts were correct.

## 2017-09-27 NOTE — Anesthesia Postprocedure Evaluation (Signed)
Anesthesia Post Note  Patient: Miranda Perkins  Procedure(s) Performed: ANTERIOR CERVICAL DECOMPRESSION/DISCECTOMY FUSION CERVICAL FOUR-FIVE ,CERVICAL FIVE-SIX,CERVICAL SIX-SEVEN (N/A ) CARPAL TUNNEL RELEASE (Left )     Patient location during evaluation: PACU Anesthesia Type: General Level of consciousness: awake and alert Pain management: pain level controlled Vital Signs Assessment: post-procedure vital signs reviewed and stable Respiratory status: spontaneous breathing, nonlabored ventilation and respiratory function stable Cardiovascular status: blood pressure returned to baseline and stable Postop Assessment: no apparent nausea or vomiting Anesthetic complications: no    Last Vitals:  Vitals:   09/27/17 1532 09/27/17 1616  BP: (!) 149/88 129/87  Pulse: 84 88  Resp: 14 16  Temp:  36.8 C  SpO2: 96% 95%    Last Pain:  Vitals:   09/27/17 1532  TempSrc:   PainSc: 3                  Lynda Rainwater

## 2017-09-27 NOTE — Transfer of Care (Signed)
Immediate Anesthesia Transfer of Care Note  Patient: Miranda Perkins  Procedure(s) Performed: ANTERIOR CERVICAL DECOMPRESSION/DISCECTOMY FUSION CERVICAL FOUR-FIVE ,CERVICAL FIVE-SIX,CERVICAL SIX-SEVEN (N/A ) CARPAL TUNNEL RELEASE (Left )  Patient Location: PACU  Anesthesia Type:General  Level of Consciousness: awake, alert , oriented and patient cooperative  Airway & Oxygen Therapy: Patient Spontanous Breathing and Patient connected to nasal cannula oxygen  Post-op Assessment: Report given to RN, Post -op Vital signs reviewed and stable and Patient moving all extremities  Post vital signs: Reviewed and stable  Last Vitals:  Vitals:   09/27/17 0937  BP: 132/80  Pulse: 75  Resp: 18  Temp: 36.6 C  SpO2: 97%    Last Pain:  Vitals:   09/27/17 0937  TempSrc: Oral         Complications: No apparent anesthesia complications

## 2017-09-27 NOTE — Anesthesia Procedure Notes (Signed)
Procedure Name: Intubation Date/Time: 09/27/2017 10:51 AM Performed by: Myna Bright, CRNA Pre-anesthesia Checklist: Patient identified, Emergency Drugs available, Patient being monitored and Suction available Patient Re-evaluated:Patient Re-evaluated prior to induction Oxygen Delivery Method: Circle system utilized Preoxygenation: Pre-oxygenation with 100% oxygen Induction Type: IV induction Ventilation: Mask ventilation without difficulty Laryngoscope Size: Mac and 3 Grade View: Grade II Tube type: Oral Tube size: 7.0 mm Number of attempts: 1 Airway Equipment and Method: Stylet Placement Confirmation: ETT inserted through vocal cords under direct vision,  positive ETCO2 and breath sounds checked- equal and bilateral Secured at: 21 cm Tube secured with: Tape Dental Injury: Teeth and Oropharynx as per pre-operative assessment

## 2017-09-27 NOTE — H&P (Signed)
Miranda Perkins is an 56 y.o. female.   Chief Complaint: neck pain left arm painNeck pain left arm pain HPI: Patient is a 56 year old female with neck pain and progressive worsening left greater than right arm pain.  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  EMG also confirmed left median nerve entrapment at the wrist consistent with carpal tunnel syndrome.patient is 25-yer-old female with neck pain and progress worsening left greater than right arm pain with imaging that showsarm pain with maging that showsi stenosis kyphosis cord compresenosis at C4-5 C5-6 andC6-due to patient's failure of conservative treatment imaging findings and progression of clinical syndrome I recommended anterior anterior cervical discectomy and fusion at C4-5, C5-6 C4-5, C5-6, and C6-7.  As well as left carpal tunnel release.  I extensively reviewed the risks and benefits of the operation with her as well as perioperative course expectations of outcome and alternatives of surgery and she understood and agreed to proceed forward.  I extensively ewed the risks rnatives of surgery and she understood and agreed to proceed forward.  Past Medical History:  Diagnosis Date  . Cancer ([facility]) 2016   basal cell, forehead  . 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 Carpal tunnel syndrome    left  . Depression   . HNP (herniated nucleus pulposus), cervical   . 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 FOOT SURGERY     bone removed from pinky toe  . HERNIA REPAIR    . KNEE SURGERY  06/21/2017   dr Rushie Nyhan  . LEG SURGERY  1972  . MOUTH SURGERY    . skin cancer removal    . TONSILLECTOMY     1979?  . TUBAL LIGATION      Family History  Problem Relation Age of Onset  . Rheum arthritis Sister   . Clotting disorder Sister   . Clotting disorder Maternal Aunt   .  Clotting disorder Maternal Aunt   . Heart Problems Mother    Social History:  reports that she quit smoking about 7 years ago. Her smoking use included cigarettes. She smoked 0.50 packs per day. she has never used smokeless tobacco. She reports that she does not drink alcohol or use drugs.  Allergies:  Allergies  Allergen Reactions  . Influenza Vaccines Anaphylaxis and Other (See Comments)    Per patient  . Penicillins Anaphylaxis and Other (See Comments)    PATIENT HAS HAD A PCN REACTION WITH IMMEDIATE RASH, FACIAL/TONGUE/THROAT SWELLING, SOB, OR LIGHTHEADEDNESS WITH HYPOTENSION:  #  #  #  YES  #  #  #   Has patient had a PCN reaction causing severe rash involving mucus membranes or skin necrosis: No PATIENT HAS HAD A PCN REACTION THAT REQUIRED HOSPITALIZATION:  #  #  #  YES  #  #  #  Has patient had a PCN reaction occurring within the last 10 years: No   . Cortisone Other (See Comments)    Turned red and ran a low grade fever for 3 days    Medications Prior to Admission  Medication Sig Dispense Refill  . Cholecalciferol (EQL VITAMIN D3) 2000 units CAPS Take 2,000 Units daily by mouth.    . Cold Sore Products (L-LYSINE) OINT Apply 1 application topically as needed.    . Cyanocobalamin (VITAMIN B-12 PO) Take  1 tablet daily by mouth.    . doxycycline (VIBRAMYCIN) 50 MG capsule Take 1 capsule (50 mg total) daily by mouth. 90 capsule 0  . L-Lysine 500 MG TABS Take 500 mg by mouth 2 (two) times daily as needed.    . meloxicam (MOBIC) 7.5 MG tablet Take 1 tablet (7.5 mg total) daily by mouth. 90 tablet 0  . triamcinolone ointment (KENALOG) 0.1 % Apply 1 application topically 2 (two) times daily. (Patient taking differently: Apply 1 application 2 (two) times daily as needed topically (for skin cracking). ) 80 g 0    No results found for this or any previous visit (from the past 48 hour(s)). No results found.  Review of Systems  Musculoskeletal: Positive for neck pain.  Neurological:  Positive for tingling, focal weakness and headaches.    Blood pressure 132/80, pulse 75, temperature 97.9 F (36.6 C), temperature source Oral, resp. rate 18, weight 106.1 kg (234 lb), SpO2 97 %. Physical Exam  Constitutional: She is oriented to person, place, and time. She appears well-developed and well-nourished.  HENT:  Head: Normocephalic.  Eyes: Pupils are equal, round, and reactive to light.  Neck: Normal range of motion.  Respiratory: Effort normal.  GI: Soft. Bowel sounds are normal.  Musculoskeletal: Normal range of motion.  Neurological: She is alert and oriented to person, place, and time. She has normal strength. GCS eye subscore is 4. GCS verbal subscore is 5. GCS motor subscore is 6.  Strength 5 out of 5 deltoid, bicep, tricep, wrist flexion, wrist extension, hand intrinsicsStrength out of 5 deltoid, bicep, tricep, wrist flexion, wrist extension, hand intrinsics except for some slight hand intrinsic weakness on the left. except for some slight hand intrinsic weakness on the left.     Assessment/Plan 56 year old presents for ACDF C4-5, C5-6, C6-7  and left carpal tunnel release.  Krishawna Stiefel P, MD 09/27/2017, 10:22 AM

## 2017-09-28 ENCOUNTER — Other Ambulatory Visit: Payer: Self-pay

## 2017-09-28 ENCOUNTER — Encounter (HOSPITAL_COMMUNITY): Payer: Self-pay | Admitting: Neurosurgery

## 2017-09-28 MED ORDER — OXYCODONE HCL 10 MG PO TABS: 10.0000 mg | ORAL_TABLET | ORAL | 0 refills | Status: DC | PRN

## 2017-09-28 NOTE — Progress Notes (Signed)
Patient is discharged from room 3C02 at this time. Alert and in stable condition. IV site d/c'd and instructions read to patient and spouse with understanding verbalized. Left unit via wheelchair with all belongings at side 

## 2017-09-28 NOTE — Discharge Instructions (Signed)
Anterior Cervical Diskectomy and Fusion, Care After Refer to this sheet in the next few weeks. These instructions provide you with information about caring for yourself after your procedure. Your health care provider may also give you more specific instructions. Your treatment has been planned according to current medical practices, but problems sometimes occur. Call your health care provider if you have any problems or questions after your procedure. What can I expect after the procedure? After the procedure, it is common to have:  Neck pain.  Discomfort when swallowing.  Slight hoarseness.  Follow these instructions at home: If You Have a Neck Brace:  Wear it as told by your health care provider. Remove it only as told by your health care provider.  Keep the brace clean and dry. Incision care   Follow instructions from your health care provider about how to take care of the cut made during surgery (incision). Make sure you: ? Wash your hands with soap and water before you change your bandage (dressing). If soap and water are not available, use hand sanitizer. ? Change your dressing as told by your health care provider. ? Leave stitches (sutures), skin glue, or adhesive strips in place. These skin closures may need to stay in place for 2 weeks or longer. If adhesive strip edges start to loosen and curl up, you may trim the loose edges. Do not remove adhesive strips completely unless your health care provider tells you to do that.  Check your incision every day for signs of infection. Watch for: ? Redness, swelling, or pain. ? Fluid or blood. ? Warmth. ? Pus or a bad smell. Managing pain, stiffness, and swelling  Take over-the-counter and prescription medicines only as told by your health care provider.  If directed, apply ice to the injured area. ? Put ice in a plastic bag. ? Place a towel between your skin and the bag. ? Leave the ice on for 20 minutes, 2-3 times per  day. Activity  Return to your normal activities as told by your health care provider. Ask your health care provider what activities are safe for you.  Do exercises as told by your health care provider.  Do not lift anything that is heavier than 10 lb (4.5 kg). General instructions  Do not drive or operate heavy machinery while taking prescription pain medicines.  Do not use any tobacco products, including cigarettes, chewing tobacco, or e-cigarettes. Tobacco can delay healing. If you need help quitting, ask your health care provider.  Keep all follow-up visits and physical therapy appointments as told by your health care provider. This is important. Contact a health care provider if:  You have a fever.  You have redness, swelling, or pain around your incision.  You have fluid or blood coming from your incision.  You have pus or a bad smell coming from your incision.  You have pain that is not controlled by your pain medicine.  You have increasing hoarseness or trouble swallowing. Get help right away if:  You have severe pain.  You have sudden numbness or weakness in your arms.  You have warmth, tenderness, or swelling in your calf.  You have chest pain.  You have difficulty breathing. This information is not intended to replace advice given to you by your health care provider. Make sure you discuss any questions you have with your health care provider. Document Released: 11/15/2015 Document Revised: 03/26/2016 Document Reviewed: 10/03/2015 Elsevier Interactive Patient Education  2018 [place]  Keep  the incision clean and dry remove the outer dressing in 2 days, leave the Steri-Strips intact. Wrap with Saran wrap for showers only Do not put any creams, lotions, or ointments on incision. Leave steri-strips on neck.  They will fall off by themselves.  Activity Walk each and every day, increasing distance each day. No lifting greater than 5 lbs.  Avoid  excessive neck motion. No lifting no bending no twisting no driving or riding a car unless coming back and forth to see me. Wear neck brace at all times except when showering.   Diet Resume your normal diet.   Return to Work Will be discussed at you follow up appointment.  Call Your Doctor If Any of These Occur Redness, drainage, or swelling at the wound.  Temperature greater than 101 degrees. Severe pain not relieved by pain medication. Incision starts to come apart. Follow Up Appt Call today for appointment in 1-2 weeks ([phone]) or for problems.  If you have any hardware placed in your spine, you will need an x-ray before your appointment.

## 2017-09-28 NOTE — Discharge Summary (Signed)
  Physician Discharge Summary  Patient ID: Miranda Perkins MRN: [id number] DOB/AGE: 07-27-1961 56 y.o.  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 Postoperatively patient did very well recovered in the floor on the floor was angling and voiding spontaneously tolerating regular diet stable for discharge home scheduled follow up in 1-2 weeks.  Consults: Significant Diagnostic Studies: Treatments: ACDF C4-7 and left carpal tunnel syndrome Discharge Exam: Blood pressure 117/74, pulse 80, temperature 98.2 F (36.8 C), resp. rate 16, weight 106.1 kg (234 lb), SpO2 97 %. Strength out of 5 wound clean dry and intact  Disposition: Home   Allergies as of 09/28/2017      Reactions   Influenza Vaccines Anaphylaxis, Other (See Comments)   Per patient   Penicillins Anaphylaxis, Other (See Comments)   PATIENT HAS HAD A PCN REACTION WITH IMMEDIATE RASH, FACIAL/TONGUE/THROAT SWELLING, SOB, OR LIGHTHEADEDNESS WITH HYPOTENSION:  #  #  #  YES  #  #  #   Has patient had a PCN reaction causing severe rash involving mucus membranes or skin necrosis: No PATIENT HAS HAD A PCN REACTION THAT REQUIRED HOSPITALIZATION:  #  #  #  YES  #  #  #  Has patient had a PCN reaction occurring within the last 10 years: No   Cortisone Other (See Comments)   Turned red and ran a low grade fever for 3 days      Medication List    STOP taking these medications   meloxicam 7.5 MG tablet Commonly known as:  MOBIC     TAKE these medications   doxycycline 50 MG capsule Commonly known as:  VIBRAMYCIN Take 1  capsule (50 mg total) daily by mouth.   EQL VITAMIN D3 2000 units Caps Generic drug:  Cholecalciferol Take 2,000 Units daily by mouth.   L-Lysine 500 MG Tabs Take 500 mg by mouth 2 (two) times daily as needed.   L-Lysine Oint Apply 1 application topically as needed.   Oxycodone HCl 10 MG Tabs Take 1 tablet (10 mg total) by mouth every 3 (three) hours as needed for severe pain ((score 7 to 10)).   triamcinolone ointment 0.1 % Commonly known as:  KENALOG Apply 1 application topically 2 (two) times daily. What changed:    when to take this  reasons to take this   VITAMIN B-12 PO Take 1 tablet daily by mouth.        Signed: Anayansi Rundquist P 09/28/2017, 7:52 AM

## 2017-11-03 ENCOUNTER — Ambulatory Visit: Admitting: Neurology

## 2017-11-11 ENCOUNTER — Ambulatory Visit: Admitting: Pediatrics

## 2017-11-22 ENCOUNTER — Ambulatory Visit: Admitting: Pediatrics

## 2017-12-15 ENCOUNTER — Encounter: Payer: Self-pay | Admitting: Pediatrics

## 2017-12-15 ENCOUNTER — Ambulatory Visit (INDEPENDENT_AMBULATORY_CARE_PROVIDER_SITE_OTHER): Admitting: Pediatrics

## 2017-12-15 VITALS — BP 117/82 | HR 78 | Temp 97.9°F | Ht 65.0 in | Wt 228.2 lb

## 2017-12-15 DIAGNOSIS — G8929 Other chronic pain: Secondary | ICD-10-CM | POA: Diagnosis not present

## 2017-12-15 DIAGNOSIS — M25562 Pain in left knee: Secondary | ICD-10-CM

## 2017-12-15 DIAGNOSIS — Z01818 Encounter for other preprocedural examination: Secondary | ICD-10-CM

## 2017-12-15 DIAGNOSIS — N814 Uterovaginal prolapse, unspecified: Secondary | ICD-10-CM

## 2017-12-15 DIAGNOSIS — E538 Deficiency of other specified B group vitamins: Secondary | ICD-10-CM | POA: Diagnosis not present

## 2017-12-15 NOTE — Progress Notes (Signed)
  Subjective:   Patient ID: Miranda Perkins, female    DOB: Mar 23, 1961, 57 y.o.   MRN: [id number] CC: Follow-up (3 month) and pre-op clearance HPI: Miranda Perkins is a 57 y.o. female presenting for Follow-up (3 month)  Had knee surgery Aug 2018. Now needs knee replacement, scheduling within the month likely. Has had neck surgery for cervical stenosis, CPS surgery L hand. L hand still very sensitive. Pt has been very pleased with improvement in pain in neck, improved ROM as well. Still needs R CPS surgery at some point.  Limited activity due to knee pain. Weight bearing, walking bother the knee. Knee swells by end of the day.   2 years ago walking 3+ miles a week regularly, no SOB or chest pressure  Breathing has been fine, not needing inhalers.  Post-concussive syndrome: forgetting small things, such as what she got on the grocery list  Thyroid nodule follow: already scheduled per pt  Past couple of months has had more trouble with pressure in pelvis, feeling organs prolapse. She was initially able to control it with kegel exercises, now will prolapse sometimes multiple time a day. Interested in repair, not established with gyn. No trouble voiding or stooling.  Relevant past medical, surgical, family and social history reviewed. Allergies and medications reviewed and updated. Social History   Tobacco Use  Smoking Status Former Smoker  . Packs/day: 0.50  . Types: Cigarettes  . Last attempt to quit: 11/26/2009  . Years since quitting: 8.0  Smokeless Tobacco Never Used   ROS: Per HPI   Objective:    BP 117/82   Pulse 78   Temp 97.9 F (36.6 C) (Oral)   Ht '5\' 5"'$  (1.651 m)   Wt 228 lb 3.2 oz (103.5 kg)   BMI 37.97 kg/m   Wt Readings from Last 3 Encounters:  12/15/17 228 lb 3.2 oz (103.5 kg)  09/28/17 234 lb (106.1 kg)  09/16/17 234 lb 9 oz (106.4 kg)    Gen: NAD, alert, cooperative with exam, NCAT EYES: EOMI, no conjunctival injection, or no icterus ENT: OP without  erythema LYMPH: no cervical LAD CV: NRRR, normal S1/S2, no murmur, distal pulses 2+ b/l Resp: CTABL, no wheezes, normal WOB Abd: +BS, soft, NTND.  Ext: No edema, warm Neuro: Alert and oriented, strength equal b/l UE and LE, coordination grossly normal MSK: normal muscle bulk  Assessment & Plan:  Nita was seen today for follow-up med problems and pre-op exam.  Diagnoses and all orders for this visit:  Vitamin B12 deficiency -     BMP8+EGFR -     Vitamin B12  Uterine prolapse -     Ambulatory referral to Gynecology  Pre-op examination No h/o heart problems, past months with decreased exercise tolerance due to pain in knee, neck prior to surgery as above. Before had no limitations with exercise as recently as two years ago. No shortness of breath with exertion, not doing much more than 4 MET however. No kidney problems, no h/o diabetes. Discussed with pt, reasonable amount of risk to proceed with knee replacement surgery which hopefully will improve functional status.  Chronic pain of left knee Planning for replacement as above with [facility]  Follow up plan: 3 mo Assunta Found, MD [place]

## 2017-12-16 LAB — BMP8+EGFR
BUN/Creatinine Ratio: 18 (ref 9–23)
BUN: 14 mg/dL (ref 6–24)
CHLORIDE: 103 mmol/L (ref 96–106)
CO2: 21 mmol/L (ref 20–29)
Calcium: 9.6 mg/dL (ref 8.7–10.2)
Creatinine, Ser: 0.79 mg/dL (ref 0.57–1.00)
GFR calc Af Amer: 95 mL/min/{1.73_m2} (ref >59)
GFR, EST NON AFRICAN AMERICAN: 83 mL/min/{1.73_m2} (ref >59)
GLUCOSE: 87 mg/dL (ref 65–99)
POTASSIUM: 4.5 mmol/L (ref 3.5–5.2)
SODIUM: 141 mmol/L (ref 134–144)

## 2017-12-16 LAB — VITAMIN B12: VITAMIN B 12: 422 pg/mL (ref 232–1245)

## 2017-12-21 ENCOUNTER — Ambulatory Visit (INDEPENDENT_AMBULATORY_CARE_PROVIDER_SITE_OTHER): Admitting: Gynecology

## 2017-12-21 ENCOUNTER — Encounter: Payer: Self-pay | Admitting: Gynecology

## 2017-12-21 VITALS — BP 130/76 | Ht 65.0 in | Wt 229.0 lb

## 2017-12-21 DIAGNOSIS — Z1151 Encounter for screening for human papillomavirus (HPV): Secondary | ICD-10-CM

## 2017-12-21 DIAGNOSIS — Z01411 Encounter for gynecological examination (general) (routine) with abnormal findings: Secondary | ICD-10-CM

## 2017-12-21 DIAGNOSIS — R102 Pelvic and perineal pain: Secondary | ICD-10-CM | POA: Diagnosis not present

## 2017-12-21 DIAGNOSIS — N393 Stress incontinence (female) (male): Secondary | ICD-10-CM | POA: Diagnosis not present

## 2017-12-21 DIAGNOSIS — M810 Age-related osteoporosis without current pathological fracture: Secondary | ICD-10-CM | POA: Diagnosis not present

## 2017-12-21 DIAGNOSIS — N952 Postmenopausal atrophic vaginitis: Secondary | ICD-10-CM | POA: Diagnosis not present

## 2017-12-21 NOTE — Addendum Note (Signed)
Addended by: Nelva Nay on: 12/21/2017 12:42 PM   Modules accepted: Orders

## 2017-12-21 NOTE — Progress Notes (Signed)
Miranda Perkins 07/22/1961 [id number]        57 y.o.  [id number] new patient for annual gynecologic exam.  Also complaining of "prolapse".  Patient has had an issue with this for approaching 10 years.  Notes intermittent bulging from the vagina when she is on her feet or straining a lot.  Other times not bothersome to her.  Does have issues with urinary incontinence with laughing coughing sneezing consistent with stress.  No significant urgency symptoms.  Last GYN checkup 2015.  Past medical history,surgical history, problem list, medications, allergies, family history and social history were all reviewed and documented as reviewed in the EPIC chart.  ROS:  Performed with pertinent positives and negatives included in the history, assessment and plan.   Additional significant findings : None   Exam: Caryn Bee assistant Vitals:   12/21/17 1141  BP: 130/76  Weight: 229 lb (103.9 kg)  Height: 5\' 5"  (1.651 m)   Body mass index is 38.11 kg/m.  General appearance:  Normal affect, orientation and appearance. Skin: Grossly normal HEENT: Without gross lesions.  No cervical or supraclavicular adenopathy. Thyroid normal.  Lungs:  Clear without wheezing, rales or rhonchi Cardiac: RR, without RMG Abdominal:  Soft, nontender, without masses, guarding, rebound, organomegaly or hernia Breasts:  Examined lying and sitting without masses, retractions, discharge or axillary adenopathy. Pelvic:  Ext, BUS, Vagina: With atrophic changes.  Mild pelvic relaxation noted but no significant cystocele, rectocele or uterine prolapse  Cervix: With atrophic changes.  Pap smear/HPV  Uterus: Axial to anteverted, normal size, shape and contour, midline and mobile nontender   Adnexa: Without masses or tenderness    Anus and perineum: Normal   Rectovaginal: Normal sphincter tone without palpated masses or tenderness.    Assessment/Plan:  57 y.o. [id number] female for annual gynecologic exam.    1. Postmenopausal/atrophic genital changes.  No significant hot flushes, night sweats, vaginal dryness or any bleeding.  Continue to monitor and report any bleeding.  Patient did have questions about HRT.  I discussed the versus benefits to include symptom relief, possible cardiovascular and bone health when started earlier versus the risks to include thrombosis such as stroke heart attack DVT and breast cancer issue.  She is not having significant symptoms we both agree not to start HRT at this time. 2. Pelvic pressure.  Patient relates feeling something protruding when she is on her feet or straining.  Exam at this point does not show significant cystocele/rectocele or uterine prolapse.  I reviewed with the patient in detail the anatomy and issues of cystocele/rectocele/uterine prolapse.  I discussed possible treatment options to include observation if not terribly bothered by the symptoms, pessary and surgery.  At this point as there is no demonstrable pathology of asked patient return on 1 of her "bad days" for reexamination to see what she is feeling. 3. Urinary incontinence consistent with stress.  Has tried Kegel exercises but does not feel they are helping.  Check baseline UA now.  Discussed stress incontinence and options up to and including surgery.  I offered referral to urology for evaluation and possible treatment.  At this point the patient is not interested but will call if she wants to pursue this. 4. Mammography 2015.  Reminded patient she is overdue and she agrees to call and schedule.  SBE monthly reviewed.  Breast exam normal today. 5. History of osteoporosis with last bone density 2015.  Was started on Boniva for 1 year.  Recommend DEXA now is  baseline and patient will follow-up for this. 6. Colonoscopy 2015.  Repeat at their recommended interval. 7. Pap smear 2015.  Pap smear/HPV today.  No history of significant abnormal Pap smears. 8. Health maintenance.  No routine lab work done  as patient does this through her primary physician's office.    Additional time in excess of her routine gynecologic exam was spent in direct face to face counseling and coordination of care in regards to her pelvic pressure, urinary incontinence and HRT discussion.    Anastasio Auerbach MD, 12:12 PM 12/21/2017

## 2017-12-21 NOTE — Patient Instructions (Signed)
Follow-up 1 year pelvic pressure symptoms are worse for reexamination.  Follow-up for bone density as scheduled  Schedule your mammogram

## 2017-12-22 LAB — URINALYSIS, COMPLETE W/RFL CULTURE
BILIRUBIN URINE: NEGATIVE
Bacteria, UA: NONE SEEN /HPF
GLUCOSE, UA: NEGATIVE
HYALINE CAST: NONE SEEN /LPF
Hgb urine dipstick: NEGATIVE
Ketones, ur: NEGATIVE
NITRITES URINE, INITIAL: NEGATIVE
PROTEIN: NEGATIVE
RBC / HPF: NONE SEEN /HPF (ref 0–2)
Specific Gravity, Urine: 1.01 (ref 1.001–1.03)
pH: <=5 (ref 5.0–8.0)

## 2017-12-22 LAB — URINE CULTURE
MICRO NUMBER:: 90218259
SPECIMEN QUALITY: ADEQUATE

## 2017-12-22 LAB — CULTURE INDICATED

## 2017-12-24 LAB — PAP IG AND HPV HIGH-RISK: HPV DNA HIGH RISK: NOT DETECTED

## 2017-12-31 DIAGNOSIS — M8580 Other specified disorders of bone density and structure, unspecified site: Secondary | ICD-10-CM

## 2017-12-31 HISTORY — DX: Other specified disorders of bone density and structure, unspecified site: M85.80

## 2018-01-04 ENCOUNTER — Ambulatory Visit (INDEPENDENT_AMBULATORY_CARE_PROVIDER_SITE_OTHER)

## 2018-01-04 DIAGNOSIS — M8588 Other specified disorders of bone density and structure, other site: Secondary | ICD-10-CM

## 2018-01-04 DIAGNOSIS — M810 Age-related osteoporosis without current pathological fracture: Secondary | ICD-10-CM

## 2018-01-05 ENCOUNTER — Other Ambulatory Visit: Payer: Self-pay | Admitting: Gynecology

## 2018-01-05 ENCOUNTER — Encounter: Payer: Self-pay | Admitting: Gynecology

## 2018-01-05 DIAGNOSIS — M8588 Other specified disorders of bone density and structure, other site: Secondary | ICD-10-CM

## 2018-01-13 ENCOUNTER — Other Ambulatory Visit (HOSPITAL_COMMUNITY): Payer: Self-pay | Admitting: Emergency Medicine

## 2018-01-13 NOTE — Progress Notes (Signed)
Surgical clearance Dr Assunta Found on chart

## 2018-01-13 NOTE — Patient Instructions (Signed)
Miranda Perkins  01/13/2018   Your procedure is scheduled on: 01-26-18  Report to [facility] Main  Entrance    ARRIVE AT 29 AM. Have a seat in the Main Lobby. Please note there is a phone at the [employer]. Please call [phone] on that phone. Someone from Short Stay will come and get you from the Main Lobby and take you to Short Stay.    Call this number if you have problems the morning of surgery [phone]     Remember: Do not eat food or drink liquids :After Midnight.     Take these medicines the morning of surgery with A SIP OF WATER: tylenol if needed                                You may not have any metal on your body including hair pins and              piercings  Do not wear jewelry, make-up, lotions, powders or perfumes, deodorant             Do not wear nail polish.  Do not shave  48 hours prior to surgery.         Do not bring valuables to the hospital. [place].  Contacts, dentures or bridgework may not be worn into surgery.  Leave suitcase in the car. After surgery it may be brought to your room.                Please read over the following fact sheets you were given: _____________________________________________________________________             [facility] - Preparing for Surgery Before surgery, you can play an important role.  Because skin is not sterile, your skin needs to be as free of germs as possible.  You can reduce the number of germs on your skin by washing with CHG (chlorahexidine gluconate) soap before surgery.  CHG is an antiseptic cleaner which kills germs and bonds with the skin to continue killing germs even after washing. Please DO NOT use if you have an allergy to CHG or antibacterial soaps.  If your skin becomes reddened/irritated stop using the CHG and inform your nurse when you arrive at Short Stay. Do not shave (including legs and underarms)  for at least 48 hours prior to the first CHG shower.  You may shave your face/neck. Please follow these instructions carefully:  1.  Shower with CHG Soap the night before surgery and the  morning of Surgery.  2.  If you choose to wash your hair, wash your hair first as usual with your  normal  shampoo.  3.  After you shampoo, rinse your hair and body thoroughly to remove the  shampoo.                           4.  Use CHG as you would any other liquid soap.  You can apply chg directly  to the skin and wash                       Gently with a scrungie or clean washcloth.  5.  Apply the CHG Soap to your body ONLY FROM THE NECK DOWN.   Do not use on face/ open                           Wound or open sores. Avoid contact with eyes, ears mouth and genitals (private parts).                       Wash face,  Genitals (private parts) with your normal soap.             6.  Wash thoroughly, paying special attention to the area where your surgery  will be performed.  7.  Thoroughly rinse your body with warm water from the neck down.  8.  DO NOT shower/wash with your normal soap after using and rinsing off  the CHG Soap.                9.  Pat yourself dry with a clean towel.            10.  Wear clean pajamas.            11.  Place clean sheets on your bed the night of your first shower and do not  sleep with pets. Day of Surgery : Do not apply any lotions/deodorants the morning of surgery.  Please wear clean clothes to the hospital/surgery center.  FAILURE TO FOLLOW THESE INSTRUCTIONS MAY RESULT IN THE CANCELLATION OF YOUR SURGERY PATIENT SIGNATURE_________________________________  NURSE SIGNATURE__________________________________  ________________________________________________________________________   Adam Phenix  An incentive spirometer is a tool that can help keep your lungs clear and active. This tool measures how well you are filling your lungs with each breath. Taking long deep  breaths may help reverse or decrease the chance of developing breathing (pulmonary) problems (especially infection) following:  A long period of time when you are unable to move or be active. BEFORE THE PROCEDURE   If the spirometer includes an indicator to show your best effort, your nurse or respiratory therapist will set it to a desired goal.  If possible, sit up straight or lean slightly forward. Try not to slouch.  Hold the incentive spirometer in an upright position. INSTRUCTIONS FOR USE  1. Sit on the edge of your bed if possible, or sit up as far as you can in bed or on a chair. 2. Hold the incentive spirometer in an upright position. 3. Breathe out normally. 4. Place the mouthpiece in your mouth and seal your lips tightly around it. 5. Breathe in slowly and as deeply as possible, raising the piston or the ball toward the top of the column. 6. Hold your breath for 3-5 seconds or for as long as possible. Allow the piston or ball to fall to the bottom of the column. 7. Remove the mouthpiece from your mouth and breathe out normally. 8. Rest for a few seconds and repeat Steps 1 through 7 at least 10 times every 1-2 hours when you are awake. Take your time and take a few normal breaths between deep breaths. 9. The spirometer may include an indicator to show your best effort. Use the indicator as a goal to work toward during each repetition. 10. After each set of 10 deep breaths, practice coughing to be sure your lungs are clear. If you have an incision (the cut made at the time of surgery), support your incision when coughing by placing a  pillow or rolled up towels firmly against it. Once you are able to get out of bed, walk around indoors and cough well. You may stop using the incentive spirometer when instructed by your caregiver.  RISKS AND COMPLICATIONS  Take your time so you do not get dizzy or light-headed.  If you are in pain, you may need to take or ask for pain medication before  doing incentive spirometry. It is harder to take a deep breath if you are having pain. AFTER USE  Rest and breathe slowly and easily.  It can be helpful to keep track of a log of your progress. Your caregiver can provide you with a simple table to help with this. If you are using the spirometer at home, follow these instructions: [place] IF:   You are having difficultly using the spirometer.  You have trouble using the spirometer as often as instructed.  Your pain medication is not giving enough relief while using the spirometer.  You develop fever of 100.5 F (38.1 C) or higher. SEEK IMMEDIATE MEDICAL CARE IF:   You cough up bloody sputum that had not been present before.  You develop fever of 102 F (38.9 C) or greater.  You develop worsening pain at or near the incision site. MAKE SURE YOU:   Understand these instructions.  Will watch your condition.  Will get help right away if you are not doing well or get worse. Document Released: 03/01/2007 Document Revised: 01/11/2012 Document Reviewed: 05/02/2007 [facility] Patient Information 2014 [place], [place].   ________________________________________________________________________

## 2018-01-17 ENCOUNTER — Other Ambulatory Visit: Payer: Self-pay

## 2018-01-17 ENCOUNTER — Ambulatory Visit (HOSPITAL_COMMUNITY)
Admission: RE | Admit: 2018-01-17 | Discharge: 2018-01-17 | Disposition: A | Discharge status: Home / Self Care | Source: Ambulatory Visit | Attending: Surgical | Admitting: Surgical

## 2018-01-17 ENCOUNTER — Encounter (HOSPITAL_COMMUNITY): Payer: Self-pay

## 2018-01-17 ENCOUNTER — Encounter (HOSPITAL_COMMUNITY)
Admission: RE | Admit: 2018-01-17 | Discharge: 2018-01-17 | Disposition: A | Discharge status: Home / Self Care | Source: Ambulatory Visit | Attending: Orthopedic Surgery | Admitting: Orthopedic Surgery

## 2018-01-17 DIAGNOSIS — Z01810 Encounter for preprocedural cardiovascular examination: Secondary | ICD-10-CM | POA: Insufficient documentation

## 2018-01-17 DIAGNOSIS — R9431 Abnormal electrocardiogram [ECG] [EKG]: Secondary | ICD-10-CM | POA: Insufficient documentation

## 2018-01-17 DIAGNOSIS — R931 Abnormal findings on diagnostic imaging of heart and coronary circulation: Secondary | ICD-10-CM | POA: Diagnosis not present

## 2018-01-17 DIAGNOSIS — M1712 Unilateral primary osteoarthritis, left knee: Secondary | ICD-10-CM | POA: Diagnosis not present

## 2018-01-17 DIAGNOSIS — Z01812 Encounter for preprocedural laboratory examination: Secondary | ICD-10-CM | POA: Insufficient documentation

## 2018-01-17 DIAGNOSIS — Z01818 Encounter for other preprocedural examination: Secondary | ICD-10-CM

## 2018-01-17 LAB — URINALYSIS, ROUTINE W REFLEX MICROSCOPIC
Bilirubin Urine: NEGATIVE
Glucose, UA: NEGATIVE mg/dL
Hgb urine dipstick: NEGATIVE
Ketones, ur: NEGATIVE mg/dL
Leukocytes, UA: NEGATIVE
Nitrite: NEGATIVE
Protein, ur: NEGATIVE mg/dL
Specific Gravity, Urine: 1.004 — ABNORMAL LOW (ref 1.005–1.030)
pH: 6 (ref 5.0–8.0)

## 2018-01-17 LAB — PROTIME-INR
INR: 0.97
Prothrombin Time: 12.8 seconds (ref 11.4–15.2)

## 2018-01-17 LAB — COMPREHENSIVE METABOLIC PANEL
ALT: 23 U/L (ref 14–54)
AST: 19 U/L (ref 15–41)
Albumin: 4.3 g/dL (ref 3.5–5.0)
Alkaline Phosphatase: 74 U/L (ref 38–126)
Anion gap: 10 (ref 5–15)
BUN: 15 mg/dL (ref 6–20)
CO2: 24 mmol/L (ref 22–32)
Calcium: 9.3 mg/dL (ref 8.9–10.3)
Chloride: 107 mmol/L (ref 101–111)
Creatinine, Ser: 0.7 mg/dL (ref 0.44–1.00)
GFR calc Af Amer: >60 mL/min (ref >60)
GFR calc non Af Amer: >60 mL/min (ref >60)
Glucose, Bld: 95 mg/dL (ref 65–99)
Potassium: 4.1 mmol/L (ref 3.5–5.1)
Sodium: 141 mmol/L (ref 135–145)
Total Bilirubin: 0.7 mg/dL (ref 0.3–1.2)
Total Protein: 7.2 g/dL (ref 6.5–8.1)

## 2018-01-17 LAB — CBC WITH DIFFERENTIAL/PLATELET
Basophils Absolute: 0 10*3/uL (ref 0.0–0.1)
Basophils Relative: 0 %
Eosinophils Absolute: 0.1 10*3/uL (ref 0.0–0.7)
Eosinophils Relative: 2 %
HCT: 43.1 % (ref 36.0–46.0)
Hemoglobin: 14 g/dL (ref 12.0–15.0)
Lymphocytes Relative: 39 %
Lymphs Abs: 2.8 10*3/uL (ref 0.7–4.0)
MCH: 28.8 pg (ref 26.0–34.0)
MCHC: 32.5 g/dL (ref 30.0–36.0)
MCV: 88.7 fL (ref 78.0–100.0)
Monocytes Absolute: 0.6 10*3/uL (ref 0.1–1.0)
Monocytes Relative: 8 %
Neutro Abs: 3.7 10*3/uL (ref 1.7–7.7)
Neutrophils Relative %: 51 %
Platelets: 300 10*3/uL (ref 150–400)
RBC: 4.86 MIL/uL (ref 3.87–5.11)
RDW: 14.2 % (ref 11.5–15.5)
WBC: 7.2 10*3/uL (ref 4.0–10.5)

## 2018-01-17 LAB — ABO/RH: ABO/RH(D): O POS

## 2018-01-17 LAB — APTT: aPTT: 35 seconds (ref 24–36)

## 2018-01-17 LAB — SURGICAL PCR SCREEN
MRSA, PCR: NEGATIVE
Staphylococcus aureus: NEGATIVE

## 2018-01-25 ENCOUNTER — Encounter (HOSPITAL_COMMUNITY): Payer: Self-pay | Admitting: Anesthesiology

## 2018-01-25 MED ORDER — BUPIVACAINE LIPOSOME 1.3 % IJ SUSP
20.0000 mL | INTRAMUSCULAR | Status: AC
  Filled 2018-01-25: qty 20

## 2018-01-25 MED ORDER — SODIUM CHLORIDE 0.9 % IV SOLN
1500.0000 mg | INTRAVENOUS | Status: AC
  Administered 2018-01-26: 1500 mg via INTRAVENOUS
  Filled 2018-01-25: qty 1500

## 2018-01-25 NOTE — H&P (Signed)
TOTAL KNEE ADMISSION H&P  Patient is being admitted for left total knee arthroplasty.  Subjective:  Chief Complaint:left knee pain.  HPI: DIMITRI DSOUZA, 57 y.o. female, has a history of pain and functional disability in the left knee due to arthritis and has failed non-surgical conservative treatments for greater than 12 weeks to includeNSAID's and/or analgesics, flexibility and strengthening excercises and activity modification.  Onset of symptoms was gradual, starting 3 years ago with gradually worsening course since that time. The patient noted prior procedures on the knee to include  arthroscopy and menisectomy on the left knee(s).  Patient currently rates pain in the left knee(s) at 8 out of 10 with activity. Patient has night pain, worsening of pain with activity and weight bearing, pain that interferes with activities of daily living, pain with passive range of motion, crepitus and joint swelling.  Patient has evidence of periarticular osteophytes and joint space narrowing by imaging studies. There is no active infection.  Patient Active Problem List   Diagnosis Date Noted  . Spinal stenosis of cervical region 09/27/2017  . Numbness and tingling in both hands 09/14/2016  . Insomnia 09/14/2016  . BMI 39.0-39.9,adult 09/14/2016  . Post concussive syndrome 04/30/2016  . Depression 11/28/2015  . Leg swelling 11/28/2015  . Multiple thyroid nodules 11/28/2015  . Vitamin B 12 deficiency 11/28/2015  . Rosacea 11/28/2015   Past Medical History:  Diagnosis Date  . Cancer ([place]) 2016   basal cell, forehead  . 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 Carpal tunnel syndrome    BILATERAL HANDS  . Depression   . Osteopenia 12/2017   T score -1.2 FRAX 4.9% / 0.3%  . Stroke ([place]) 1993   while on Chloramphenicol, no residuaL; [MEDICATION WAS GIVEN TO TREAT TICK BITE] ; patient states  "i stroked out right next to my doctor , he said i passed out and that was my only symptom" ;     Past  Surgical History:  Procedure Laterality Date  . ANTERIOR CERVICAL DECOMP/DISCECTOMY FUSION N/A 09/27/2017   Procedure: ANTERIOR CERVICAL DECOMPRESSION/DISCECTOMY FUSION CERVICAL FOUR-FIVE ,CERVICAL FIVE-SIX,CERVICAL SIX-SEVEN;  Surgeon: Kary Kos, MD;  Location: [place];  Service: Neurosurgery;  Laterality: N/A;  . CARPAL TUNNEL RELEASE Left 09/27/2017   Procedure: CARPAL TUNNEL RELEASE;  Surgeon: Kary Kos, MD;  Location: [place];  Service: Neurosurgery;  Laterality: Left;  . FOOT SURGERY     bone removed from pinky toe  . HERNIA REPAIR    . KNEE SURGERY Left 06/21/2017   dr Rushie Nyhan; meniscus tear   . LEG SURGERY  1972  . MOUTH SURGERY    . skin cancer removal    . TONSILLECTOMY     1979?  . TUBAL LIGATION      Current Facility-Administered Medications  Medication Dose Route Frequency Provider Last Rate Last Dose  . [START ON 01/26/2018] bupivacaine liposome (EXPAREL) 1.3 % injection 266 mg  20 mL Infiltration On Call to OR Latanya Maudlin, MD      . Derrill Memo ON 01/26/2018] vancomycin (VANCOCIN) 1,500 mg in sodium chloride 0.9 % 500 mL IVPB  1,500 mg Intravenous On Call to [place], MD       Current Outpatient Medications  Medication Sig Dispense Refill Last Dose  . acetaminophen (TYLENOL) 500 MG tablet Take 1,000 mg by mouth daily as needed for moderate pain or headache.     . ibuprofen (ADVIL,MOTRIN) 200 MG tablet Take 800 mg by mouth daily as needed  for headache.      Allergies  Allergen Reactions  . Influenza Vaccines Anaphylaxis and Other (See Comments)    Per patient  . Penicillins Anaphylaxis and Other (See Comments)    PATIENT HAS HAD A PCN REACTION WITH IMMEDIATE RASH, FACIAL/TONGUE/THROAT SWELLING, SOB, OR LIGHTHEADEDNESS WITH HYPOTENSION:  #  #  #  YES  #  #  #   Has patient had a PCN reaction causing severe rash involving mucus membranes or skin necrosis: No PATIENT HAS HAD A PCN REACTION THAT REQUIRED HOSPITALIZATION:  #  #  #  YES  #  #  #  Has patient had a  PCN reaction occurring within the last 10 years: No   . Cortisone Other (See Comments)    Turned red and ran a low grade fever for 3 days    Social History   Tobacco Use  . Smoking status: Former Smoker    Packs/day: 0.50    Types: Cigarettes    Last attempt to quit: 11/26/2009    Years since quitting: 8.1  . Smokeless tobacco: Never Used  Substance Use Topics  . Alcohol use: Yes    Comment: maybe 2 drinks a year    Family History  Problem Relation Age of Onset  . Rheum arthritis Sister   . Clotting disorder Sister   . Rheum arthritis Sister   . Thyroid disease Sister   . Clotting disorder Maternal Aunt   . Clotting disorder Maternal Aunt   . Heart Problems Mother   . Rheum arthritis Mother   . Cancer Father        ? type  . Cancer Paternal Grandfather        Brain cancer     Review of Systems  Constitutional: Negative.   HENT: Negative.   Eyes: Negative.   Gastrointestinal: Negative.   Genitourinary: Negative.   Musculoskeletal: Positive for joint pain and myalgias. Negative for back pain, falls and neck pain.  Skin: Negative.   Neurological: Negative.   Endo/Heme/Allergies: Negative.   Psychiatric/Behavioral: Positive for depression. Negative for hallucinations, memory loss, substance abuse and suicidal ideas. The patient is nervous/anxious. The patient does not have insomnia.     Objective:  Physical Exam  Constitutional: She is oriented to person, place, and time. She appears well-developed. No distress.  Morbidly obese  HENT:  Head: Normocephalic and atraumatic.  Right Ear: External ear normal.  Left Ear: External ear normal.  Nose: Nose normal.  Mouth/Throat: Oropharynx is clear and moist.  Eyes: Conjunctivae and EOM are normal.  Neck: Normal range of motion. Neck supple.  Cardiovascular: Normal rate, regular rhythm, normal heart sounds and intact distal pulses.  No murmur heard. Respiratory: Effort normal and breath sounds normal. No respiratory  distress. She has no wheezes.  GI: Soft. Bowel sounds are normal. She exhibits no distension. There is no tenderness.  Musculoskeletal:       Right hip: Normal.       Left hip: Normal.       Right knee: Normal.       Left knee: She exhibits decreased range of motion and swelling. She exhibits no effusion and no erythema. Tenderness found. Medial joint line and lateral joint line tenderness noted.  Neurological: She is alert and oriented to person, place, and time. She has normal strength. No sensory deficit.  Skin: No rash noted. She is not diaphoretic. No erythema.  Psychiatric: She has a normal mood and affect. Her behavior is normal.  Vital signs  BP: 125/85 HR: 76 bpm  Estimated body mass index is 38.44 kg/m as calculated from the following:   Height as of 01/17/18: 5\' 5"  (1.651 m).   Weight as of 01/17/18: 104.8 kg (231 lb).   Imaging Review Plain radiographs demonstrate severe degenerative joint disease of the left knee(s). The overall alignment ismild varus. The bone quality appears to be good for age and reported activity level.   Preoperative templating of the joint replacement has been completed, documented, and submitted to the Operating Room personnel in order to optimize intra-operative equipment management.   Anticipated LOS equal to or greater than 2 midnights due to - Age 36 and older with one or more of the following:  - Obesity  - Expected need for hospital services (PT, OT, Nursing) required for safe  discharge  - Anticipated need for postoperative skilled nursing care or inpatient rehab  - Active co-morbidities: Stroke OR   - Unanticipated findings during/Post Surgery: None  - Patient is a high risk of re-admission due to: None     Assessment/Plan:  End stage primary osteoarthritis, left knee   The patient history, physical examination, clinical judgment of the provider and imaging studies are consistent with end stage degenerative joint disease of  the left knee(s) and total knee arthroplasty is deemed medically necessary. The treatment options including medical management, injection therapy arthroscopy and arthroplasty were discussed at length. The risks and benefits of total knee arthroplasty were presented and reviewed. The risks due to aseptic loosening, infection, stiffness, patella tracking problems, thromboembolic complications and other imponderables were discussed. The patient acknowledged the explanation, agreed to proceed with the plan and consent was signed. Patient is being admitted for inpatient treatment for surgery, pain control, PT, OT, prophylactic antibiotics, VTE prophylaxis, progressive ambulation and ADL's and discharge planning. The patient is planning to be discharged home with home health services   Therapy Plans: HHPT then outpatient therapy Disposition: Home with husband Planned DVT prophylaxis: aspirin 325mg  BID DME needed: walker and 3-n-1 PCP: Dr. Assunta Found Other: topical TXA   Ardeen Jourdain, PA-C

## 2018-01-25 NOTE — Anesthesia Preprocedure Evaluation (Addendum)
Anesthesia Evaluation  Patient identified by MRN, date of birth, ID band Patient awake    Reviewed: Allergy & Precautions, NPO status , Patient's Chart, lab work & pertinent test results  Airway Mallampati: II  TM Distance: >3 FB Neck ROM: Full    Dental  (+) Missing, Poor Dentition,    Pulmonary neg pulmonary ROS, former smoker,    Pulmonary exam normal breath sounds clear to auscultation       Cardiovascular negative cardio ROS Normal cardiovascular exam Rhythm:Regular Rate:Normal     Neuro/Psych PSYCHIATRIC DISORDERS Depression ? CVA vs syncopal episode 90's  Neuromuscular disease CVA, No Residual Symptoms    GI/Hepatic negative GI ROS, Neg liver ROS,   Endo/Other  Obesity  Renal/GU negative Renal ROS     Musculoskeletal  (+) Arthritis , Osteoarthritis,  OA left knee   Abdominal   Peds  Hematology negative hematology ROS (+)   Anesthesia Other Findings   Reproductive/Obstetrics                            Anesthesia Physical Anesthesia Plan  ASA: III  Anesthesia Plan: Spinal   Post-op Pain Management:  Regional for Post-op pain   Induction:   PONV Risk Score and Plan: Propofol infusion, Dexamethasone, Ondansetron and Treatment may vary due to age or medical condition  Airway Management Planned: Natural Airway  Additional Equipment:   Intra-op Plan:   Post-operative Plan:   Informed Consent: I have reviewed the patients History and Physical, chart, labs and discussed the procedure including the risks, benefits and alternatives for the proposed anesthesia with the patient or authorized representative who has indicated his/her understanding and acceptance.   Dental advisory given  Plan Discussed with: Anesthesiologist, Surgeon and CRNA  Anesthesia Plan Comments:        Anesthesia Quick Evaluation                                  Anesthesia Evaluation  Patient  identified by MRN, date of birth, ID band Patient awake    Reviewed: Allergy & Precautions, NPO status , Patient's Chart, lab work & pertinent test results  Airway Mallampati: II  TM Distance: >3 FB Neck ROM: Full    Dental  (+) Dental Advisory Given, Missing,    Pulmonary neg sleep apnea, neg COPD, former smoker,    Pulmonary exam normal breath sounds clear to auscultation       Cardiovascular Exercise Tolerance: Good (-) Past MI negative cardio ROS Normal cardiovascular exam Rhythm:Regular Rate:Normal     Neuro/Psych Depression B/l upper extremity numbness and tingling CVA, No Residual Symptoms    GI/Hepatic negative GI ROS, Neg liver ROS, neg GERD  ,  Endo/Other  neg diabetesMorbid obesity  Renal/GU negative Renal ROS  negative genitourinary   Musculoskeletal negative musculoskeletal ROS (+)   Abdominal   Peds  Hematology negative hematology ROS (+)   Anesthesia Other Findings Basal cell ca skin; ANAPHYLAXIS TO PENICILLINS  Reproductive/Obstetrics                            Anesthesia Physical Anesthesia Plan  ASA: III  Anesthesia Plan: General   Post-op Pain Management:    Induction: Intravenous  PONV Risk Score and Plan: 4 or greater and Treatment may vary due to age or medical condition, Ondansetron, Dexamethasone, Midazolam and Scopolamine  patch - Pre-op  Airway Management Planned: Oral ETT  Additional Equipment: None  Intra-op Plan:   Post-operative Plan: Extubation in OR  Informed Consent: I have reviewed the patients History and Physical, chart, labs and discussed the procedure including the risks, benefits and alternatives for the proposed anesthesia with the patient or authorized representative who has indicated his/her understanding and acceptance.   Dental advisory given  Plan Discussed with: CRNA  Anesthesia Plan Comments:         Anesthesia Quick Evaluation                                    Anesthesia Evaluation  Patient identified by MRN, date of birth, ID band Patient awake    Reviewed: Allergy & Precautions, NPO status , Patient's Chart, lab work & pertinent test results  Airway Mallampati: II  TM Distance: >3 FB Neck ROM: Full    Dental  (+) Dental Advisory Given, Missing,    Pulmonary neg sleep apnea, neg COPD, former smoker,    Pulmonary exam normal breath sounds clear to auscultation       Cardiovascular Exercise Tolerance: Good (-) Past MI negative cardio ROS Normal cardiovascular exam Rhythm:Regular Rate:Normal     Neuro/Psych Depression B/l upper extremity numbness and tingling CVA, No Residual Symptoms    GI/Hepatic negative GI ROS, Neg liver ROS, neg GERD  ,  Endo/Other  neg diabetesMorbid obesity  Renal/GU negative Renal ROS  negative genitourinary   Musculoskeletal negative musculoskeletal ROS (+)   Abdominal   Peds  Hematology negative hematology ROS (+)   Anesthesia Other Findings Basal cell ca skin; ANAPHYLAXIS TO PENICILLINS  Reproductive/Obstetrics                            Anesthesia Physical Anesthesia Plan  ASA: III  Anesthesia Plan: General   Post-op Pain Management:    Induction: Intravenous  PONV Risk Score and Plan: 4 or greater and Treatment may vary due to age or medical condition, Ondansetron, Dexamethasone, Midazolam and Scopolamine patch - Pre-op  Airway Management Planned: Oral ETT  Additional Equipment: None  Intra-op Plan:   Post-operative Plan: Extubation in OR  Informed Consent: I have reviewed the patients History and Physical, chart, labs and discussed the procedure including the risks, benefits and alternatives for the proposed anesthesia with the patient or authorized representative who has indicated his/her understanding and acceptance.   Dental advisory given  Plan Discussed with: CRNA  Anesthesia Plan Comments:         Anesthesia Quick  Evaluation                                   Anesthesia Evaluation  Patient identified by MRN, date of birth, ID band Patient awake    Reviewed: Allergy & Precautions, NPO status , Patient's Chart, lab work & pertinent test results  Airway Mallampati: II  TM Distance: >3 FB Neck ROM: Full    Dental  (+) Dental Advisory Given, Missing,    Pulmonary neg sleep apnea, neg COPD, former smoker,    Pulmonary exam normal breath sounds clear to auscultation       Cardiovascular Exercise Tolerance: Good (-) Past MI negative cardio ROS Normal cardiovascular exam Rhythm:Regular Rate:Normal     Neuro/Psych Depression B/l upper extremity numbness and  tingling CVA, No Residual Symptoms    GI/Hepatic negative GI ROS, Neg liver ROS, neg GERD  ,  Endo/Other  neg diabetesMorbid obesity  Renal/GU negative Renal ROS  negative genitourinary   Musculoskeletal negative musculoskeletal ROS (+)   Abdominal   Peds  Hematology negative hematology ROS (+)   Anesthesia Other Findings Basal cell ca skin; ANAPHYLAXIS TO PENICILLINS  Reproductive/Obstetrics                            Anesthesia Physical Anesthesia Plan  ASA: III  Anesthesia Plan: General   Post-op Pain Management:    Induction: Intravenous  PONV Risk Score and Plan: 4 or greater and Treatment may vary due to age or medical condition, Ondansetron, Dexamethasone, Midazolam and Scopolamine patch - Pre-op  Airway Management Planned: Oral ETT  Additional Equipment: None  Intra-op Plan:   Post-operative Plan: Extubation in OR  Informed Consent: I have reviewed the patients History and Physical, chart, labs and discussed the procedure including the risks, benefits and alternatives for the proposed anesthesia with the patient or authorized representative who has indicated his/her understanding and acceptance.   Dental advisory given  Plan Discussed with: CRNA  Anesthesia Plan  Comments:         Anesthesia Quick Evaluation

## 2018-01-26 ENCOUNTER — Encounter (HOSPITAL_COMMUNITY): Payer: Self-pay | Admitting: Emergency Medicine

## 2018-01-26 ENCOUNTER — Inpatient Hospital Stay (HOSPITAL_COMMUNITY)
Admission: RE | Admit: 2018-01-26 | Discharge: 2018-01-28 | DRG: 470 | Disposition: A | Discharge status: Home Health | Source: Ambulatory Visit | Attending: Orthopedic Surgery | Admitting: Orthopedic Surgery

## 2018-01-26 ENCOUNTER — Other Ambulatory Visit: Payer: Self-pay

## 2018-01-26 ENCOUNTER — Inpatient Hospital Stay (HOSPITAL_COMMUNITY): Admitting: Anesthesiology

## 2018-01-26 ENCOUNTER — Inpatient Hospital Stay (HOSPITAL_COMMUNITY)

## 2018-01-26 ENCOUNTER — Encounter (HOSPITAL_COMMUNITY)
Admission: RE | Disposition: A | Discharge status: Home Health | Payer: Self-pay | Source: Ambulatory Visit | Attending: Orthopedic Surgery

## 2018-01-26 DIAGNOSIS — Z8673 Personal history of transient ischemic attack (TIA), and cerebral infarction without residual deficits: Secondary | ICD-10-CM | POA: Diagnosis not present

## 2018-01-26 DIAGNOSIS — M24562 Contracture, left knee: Secondary | ICD-10-CM | POA: Diagnosis present

## 2018-01-26 DIAGNOSIS — Z8589 Personal history of malignant neoplasm of other organs and systems: Secondary | ICD-10-CM | POA: Diagnosis not present

## 2018-01-26 DIAGNOSIS — Z981 Arthrodesis status: Secondary | ICD-10-CM

## 2018-01-26 DIAGNOSIS — Z887 Allergy status to serum and vaccine status: Secondary | ICD-10-CM | POA: Diagnosis not present

## 2018-01-26 DIAGNOSIS — Z880 Allergy status to penicillin: Secondary | ICD-10-CM

## 2018-01-26 DIAGNOSIS — Z85828 Personal history of other malignant neoplasm of skin: Secondary | ICD-10-CM | POA: Diagnosis not present

## 2018-01-26 DIAGNOSIS — Z888 Allergy status to other drugs, medicaments and biological substances status: Secondary | ICD-10-CM

## 2018-01-26 DIAGNOSIS — Z79899 Other long term (current) drug therapy: Secondary | ICD-10-CM | POA: Diagnosis not present

## 2018-01-26 DIAGNOSIS — Z8261 Family history of arthritis: Secondary | ICD-10-CM | POA: Diagnosis not present

## 2018-01-26 DIAGNOSIS — M1712 Unilateral primary osteoarthritis, left knee: Secondary | ICD-10-CM | POA: Diagnosis present

## 2018-01-26 DIAGNOSIS — M8580 Other specified disorders of bone density and structure, unspecified site: Secondary | ICD-10-CM | POA: Diagnosis present

## 2018-01-26 DIAGNOSIS — Z96652 Presence of left artificial knee joint: Secondary | ICD-10-CM

## 2018-01-26 DIAGNOSIS — Z6838 Body mass index (BMI) 38.0-38.9, adult: Secondary | ICD-10-CM

## 2018-01-26 DIAGNOSIS — Z87891 Personal history of nicotine dependence: Secondary | ICD-10-CM

## 2018-01-26 DIAGNOSIS — Z96659 Presence of unspecified artificial knee joint: Secondary | ICD-10-CM

## 2018-01-26 HISTORY — PX: TOTAL KNEE ARTHROPLASTY: SHX125

## 2018-01-26 LAB — TYPE AND SCREEN
ABO/RH(D): O POS
Antibody Screen: NEGATIVE

## 2018-01-26 SURGERY — ARTHROPLASTY, KNEE, TOTAL
Anesthesia: Spinal | Site: Knee | Laterality: Left

## 2018-01-26 MED ORDER — MIDAZOLAM HCL 2 MG/2ML IJ SOLN
INTRAMUSCULAR | Status: AC
  Filled 2018-01-26: qty 2

## 2018-01-26 MED ORDER — ONDANSETRON HCL 4 MG/2ML IJ SOLN
INTRAMUSCULAR | Status: AC
  Filled 2018-01-26: qty 2

## 2018-01-26 MED ORDER — OXYCODONE HCL 5 MG PO TABS
5.0000 mg | ORAL_TABLET | ORAL | Status: DC | PRN
  Administered 2018-01-26 – 2018-01-27 (×5): 10 mg via ORAL
  Filled 2018-01-26 (×5): qty 2

## 2018-01-26 MED ORDER — THROMBIN 5000 UNITS EX SOLR
CUTANEOUS | Status: DC | PRN
  Administered 2018-01-26: 5000 [IU] via TOPICAL

## 2018-01-26 MED ORDER — SODIUM CHLORIDE 0.9 % IV SOLN
INTRAVENOUS | Status: AC
  Filled 2018-01-26: qty 500000

## 2018-01-26 MED ORDER — FENTANYL CITRATE (PF) 100 MCG/2ML IJ SOLN
INTRAMUSCULAR | Status: DC | PRN
  Administered 2018-01-26: 100 ug via INTRAVENOUS

## 2018-01-26 MED ORDER — MENTHOL 3 MG MT LOZG: 1.0000 | LOZENGE | OROMUCOSAL | Status: DC | PRN

## 2018-01-26 MED ORDER — DEXAMETHASONE SODIUM PHOSPHATE 10 MG/ML IJ SOLN
INTRAMUSCULAR | Status: AC
  Filled 2018-01-26: qty 1

## 2018-01-26 MED ORDER — GABAPENTIN 300 MG PO CAPS
300.0000 mg | ORAL_CAPSULE | Freq: Three times a day (TID) | ORAL | Status: DC
  Administered 2018-01-26 – 2018-01-27 (×4): 300 mg via ORAL
  Filled 2018-01-26 (×4): qty 1

## 2018-01-26 MED ORDER — HYDROCODONE-ACETAMINOPHEN 7.5-325 MG PO TABS: 1.0000 | ORAL_TABLET | Freq: Once | ORAL | Status: DC | PRN

## 2018-01-26 MED ORDER — SODIUM CHLORIDE 0.9 % IV SOLN
INTRAVENOUS | Status: DC | PRN
  Administered 2018-01-26: 500 mL

## 2018-01-26 MED ORDER — LACTATED RINGERS IV SOLN
INTRAVENOUS | Status: DC
  Administered 2018-01-26 (×2): via INTRAVENOUS

## 2018-01-26 MED ORDER — CHLORHEXIDINE GLUCONATE 4 % EX LIQD: 60.0000 mL | Freq: Once | CUTANEOUS | Status: DC

## 2018-01-26 MED ORDER — HYDROCODONE-ACETAMINOPHEN 7.5-325 MG PO TABS
ORAL_TABLET | ORAL | Status: AC
  Filled 2018-01-26: qty 1

## 2018-01-26 MED ORDER — PROPOFOL 10 MG/ML IV BOLUS
INTRAVENOUS | Status: AC
  Filled 2018-01-26: qty 20

## 2018-01-26 MED ORDER — METHOCARBAMOL 500 MG PO TABS
500.0000 mg | ORAL_TABLET | Freq: Four times a day (QID) | ORAL | Status: DC | PRN
  Administered 2018-01-26 – 2018-01-28 (×4): 500 mg via ORAL
  Filled 2018-01-26 (×4): qty 1

## 2018-01-26 MED ORDER — DEXAMETHASONE SODIUM PHOSPHATE 10 MG/ML IJ SOLN
INTRAMUSCULAR | Status: DC | PRN
  Administered 2018-01-26: 10 mg via INTRAVENOUS

## 2018-01-26 MED ORDER — SODIUM CHLORIDE 0.9 % IJ SOLN
INTRAMUSCULAR | Status: DC | PRN
  Administered 2018-01-26: 20 mL

## 2018-01-26 MED ORDER — VANCOMYCIN HCL IN DEXTROSE 1-5 GM/200ML-% IV SOLN
1000.0000 mg | Freq: Two times a day (BID) | INTRAVENOUS | Status: AC
  Administered 2018-01-26: 1000 mg via INTRAVENOUS
  Filled 2018-01-26: qty 200

## 2018-01-26 MED ORDER — BISACODYL 5 MG PO TBEC: 5.0000 mg | DELAYED_RELEASE_TABLET | Freq: Every day | ORAL | Status: DC | PRN

## 2018-01-26 MED ORDER — SODIUM CHLORIDE 0.9 % IJ SOLN
INTRAMUSCULAR | Status: AC
  Filled 2018-01-26: qty 20

## 2018-01-26 MED ORDER — ROPIVACAINE HCL 7.5 MG/ML IJ SOLN
INTRAMUSCULAR | Status: DC | PRN
  Administered 2018-01-26: 20 mL via PERINEURAL

## 2018-01-26 MED ORDER — MIDAZOLAM HCL 5 MG/5ML IJ SOLN
INTRAMUSCULAR | Status: DC | PRN
  Administered 2018-01-26: 2 mg via INTRAVENOUS

## 2018-01-26 MED ORDER — METOCLOPRAMIDE HCL 5 MG/ML IJ SOLN: 5.0000 mg | Freq: Three times a day (TID) | INTRAMUSCULAR | Status: DC | PRN

## 2018-01-26 MED ORDER — BUPIVACAINE LIPOSOME 1.3 % IJ SUSP
INTRAMUSCULAR | Status: DC | PRN
  Administered 2018-01-26: 20 mL

## 2018-01-26 MED ORDER — METOCLOPRAMIDE HCL 5 MG PO TABS: 5.0000 mg | ORAL_TABLET | Freq: Three times a day (TID) | ORAL | Status: DC | PRN

## 2018-01-26 MED ORDER — ONDANSETRON HCL 4 MG/2ML IJ SOLN
4.0000 mg | Freq: Four times a day (QID) | INTRAMUSCULAR | Status: DC | PRN
  Administered 2018-01-26: 4 mg via INTRAVENOUS
  Filled 2018-01-26: qty 2

## 2018-01-26 MED ORDER — FLEET ENEMA 7-19 GM/118ML RE ENEM: 1.0000 | ENEMA | Freq: Once | RECTAL | Status: DC | PRN

## 2018-01-26 MED ORDER — BUPIVACAINE IN DEXTROSE 0.75-8.25 % IT SOLN
INTRATHECAL | Status: DC | PRN
  Administered 2018-01-26: 15 mg via INTRATHECAL

## 2018-01-26 MED ORDER — HYDROMORPHONE HCL 1 MG/ML IJ SOLN
0.5000 mg | INTRAMUSCULAR | Status: DC | PRN
  Administered 2018-01-26: 1 mg via INTRAVENOUS
  Filled 2018-01-26: qty 1

## 2018-01-26 MED ORDER — METHOCARBAMOL 1000 MG/10ML IJ SOLN
500.0000 mg | Freq: Four times a day (QID) | INTRAVENOUS | Status: DC | PRN
  Administered 2018-01-26: 500 mg via INTRAVENOUS
  Filled 2018-01-26: qty 550

## 2018-01-26 MED ORDER — PHENOL 1.4 % MT LIQD: 1.0000 | OROMUCOSAL | Status: DC | PRN

## 2018-01-26 MED ORDER — ALUM & MAG HYDROXIDE-SIMETH 200-200-20 MG/5ML PO SUSP: 30.0000 mL | ORAL | Status: DC | PRN

## 2018-01-26 MED ORDER — TRANEXAMIC ACID 1000 MG/10ML IV SOLN
2000.0000 mg | Freq: Once | INTRAVENOUS | Status: DC
  Filled 2018-01-26: qty 20

## 2018-01-26 MED ORDER — PROPOFOL 10 MG/ML IV BOLUS
INTRAVENOUS | Status: AC
Start: 2018-01-26 — End: 2018-01-26
  Filled 2018-01-26: qty 20

## 2018-01-26 MED ORDER — RIVAROXABAN 10 MG PO TABS
10.0000 mg | ORAL_TABLET | Freq: Every day | ORAL | Status: DC
  Administered 2018-01-27 – 2018-01-28 (×2): 10 mg via ORAL
  Filled 2018-01-26 (×2): qty 1

## 2018-01-26 MED ORDER — LACTATED RINGERS IV SOLN
INTRAVENOUS | Status: DC
  Administered 2018-01-26 – 2018-01-27 (×4): via INTRAVENOUS

## 2018-01-26 MED ORDER — DOCUSATE SODIUM 100 MG PO CAPS
100.0000 mg | ORAL_CAPSULE | Freq: Two times a day (BID) | ORAL | Status: DC
  Administered 2018-01-26 – 2018-01-28 (×4): 100 mg via ORAL
  Filled 2018-01-26 (×4): qty 1

## 2018-01-26 MED ORDER — ONDANSETRON HCL 4 MG/2ML IJ SOLN
INTRAMUSCULAR | Status: DC | PRN
  Administered 2018-01-26: 4 mg via INTRAVENOUS

## 2018-01-26 MED ORDER — HYDROMORPHONE HCL 1 MG/ML IJ SOLN: 0.2500 mg | INTRAMUSCULAR | Status: DC | PRN

## 2018-01-26 MED ORDER — FENTANYL CITRATE (PF) 100 MCG/2ML IJ SOLN
INTRAMUSCULAR | Status: AC
  Filled 2018-01-26: qty 2

## 2018-01-26 MED ORDER — PROPOFOL 500 MG/50ML IV EMUL
INTRAVENOUS | Status: DC | PRN
  Administered 2018-01-26: 100 ug/kg/min via INTRAVENOUS

## 2018-01-26 MED ORDER — MEPERIDINE HCL 50 MG/ML IJ SOLN: 6.2500 mg | INTRAMUSCULAR | Status: DC | PRN

## 2018-01-26 MED ORDER — POLYETHYLENE GLYCOL 3350 17 G PO PACK: 17.0000 g | PACK | Freq: Every day | ORAL | Status: DC | PRN

## 2018-01-26 MED ORDER — THROMBIN (RECOMBINANT) 5000 UNITS EX SOLR
CUTANEOUS | Status: AC
  Filled 2018-01-26: qty 5000

## 2018-01-26 MED ORDER — METOCLOPRAMIDE HCL 5 MG/ML IJ SOLN: 10.0000 mg | Freq: Once | INTRAMUSCULAR | Status: DC | PRN

## 2018-01-26 MED ORDER — CEFAZOLIN SODIUM-DEXTROSE 2-4 GM/100ML-% IV SOLN
INTRAVENOUS | Status: AC
  Filled 2018-01-26: qty 100

## 2018-01-26 MED ORDER — OXYCODONE HCL 5 MG PO TABS
ORAL_TABLET | ORAL | Status: AC
  Administered 2018-01-26: 10 mg via ORAL
  Filled 2018-01-26: qty 1

## 2018-01-26 MED ORDER — OXYCODONE HCL 5 MG PO TABS
5.0000 mg | ORAL_TABLET | ORAL | Status: DC | PRN
  Administered 2018-01-26: 5 mg via ORAL

## 2018-01-26 MED ORDER — ONDANSETRON HCL 4 MG PO TABS: 4.0000 mg | ORAL_TABLET | Freq: Four times a day (QID) | ORAL | Status: DC | PRN

## 2018-01-26 MED ORDER — BUPIVACAINE HCL (PF) 0.25 % IJ SOLN
INTRAMUSCULAR | Status: AC
  Filled 2018-01-26: qty 30

## 2018-01-26 MED ORDER — FERROUS SULFATE 325 (65 FE) MG PO TABS
325.0000 mg | ORAL_TABLET | Freq: Three times a day (TID) | ORAL | Status: DC
  Administered 2018-01-27 – 2018-01-28 (×3): 325 mg via ORAL
  Filled 2018-01-26 (×4): qty 1

## 2018-01-26 MED ORDER — ACETAMINOPHEN 325 MG PO TABS
325.0000 mg | ORAL_TABLET | Freq: Four times a day (QID) | ORAL | Status: DC | PRN
  Administered 2018-01-27 – 2018-01-28 (×3): 650 mg via ORAL
  Filled 2018-01-26 (×3): qty 2

## 2018-01-26 MED ORDER — LACTATED RINGERS IV SOLN: INTRAVENOUS | Status: DC

## 2018-01-26 SURGICAL SUPPLY — 64 items
BAG DECANTER FOR FLEXI CONT (MISCELLANEOUS) ×2 IMPLANT
BAG ZIPLOCK 12X15 (MISCELLANEOUS) IMPLANT
BANDAGE ACE 4X5 VEL STRL LF (GAUZE/BANDAGES/DRESSINGS) ×2 IMPLANT
BANDAGE ACE 6X5 VEL STRL LF (GAUZE/BANDAGES/DRESSINGS) ×2 IMPLANT
BLADE SAG 18X100X1.27 (BLADE) ×2 IMPLANT
BLADE SAW SGTL 11.0X1.19X90.0M (BLADE) ×2 IMPLANT
BNDG GAUZE ELAST 4 BULKY (GAUZE/BANDAGES/DRESSINGS) ×2 IMPLANT
CAP KNEE TOTAL 3 SIGMA ×2 IMPLANT
CEMENT HV SMART SET (Cement) ×4 IMPLANT
COVER SURGICAL LIGHT HANDLE (MISCELLANEOUS) ×2 IMPLANT
CUFF TOURN SGL QUICK 34 (TOURNIQUET CUFF) ×1
CUFF TRNQT CYL 34X4X40X1 (TOURNIQUET CUFF) ×1 IMPLANT
DECANTER SPIKE VIAL GLASS SM (MISCELLANEOUS) ×2 IMPLANT
DRAPE INCISE IOBAN 66X45 STRL (DRAPES) IMPLANT
DRAPE TOP 10253 STERILE (DRAPES) IMPLANT
DRAPE U-SHAPE 47X51 STRL (DRAPES) ×2 IMPLANT
DRSG ADAPTIC 3X8 NADH LF (GAUZE/BANDAGES/DRESSINGS) ×2 IMPLANT
DRSG EMULSION OIL 3X16 NADH (GAUZE/BANDAGES/DRESSINGS) ×2 IMPLANT
DRSG PAD ABDOMINAL 8X10 ST (GAUZE/BANDAGES/DRESSINGS) ×4 IMPLANT
DURAPREP 26ML APPLICATOR (WOUND CARE) ×2 IMPLANT
ELECT BLADE TIP CTD 4 INCH (ELECTRODE) ×2 IMPLANT
ELECT REM PT RETURN 15FT ADLT (MISCELLANEOUS) ×2 IMPLANT
EVACUATOR 1/8 PVC DRAIN (DRAIN) ×2 IMPLANT
FACESHIELD WRAPAROUND (MASK) ×2 IMPLANT
GAUZE SPONGE 4X4 12PLY STRL (GAUZE/BANDAGES/DRESSINGS) ×2 IMPLANT
GLOVE BIOGEL PI IND STRL 6.5 (GLOVE) ×1 IMPLANT
GLOVE BIOGEL PI IND STRL 8.5 (GLOVE) ×1 IMPLANT
GLOVE BIOGEL PI INDICATOR 6.5 (GLOVE) ×1
GLOVE BIOGEL PI INDICATOR 8.5 (GLOVE) ×1
GLOVE ECLIPSE 8.0 STRL XLNG CF (GLOVE) ×4 IMPLANT
GLOVE SURG SS PI 6.5 STRL IVOR (GLOVE) ×2 IMPLANT
GOWN STRL REUS W/TWL LRG LVL3 (GOWN DISPOSABLE) ×2 IMPLANT
GOWN STRL REUS W/TWL XL LVL3 (GOWN DISPOSABLE) ×2 IMPLANT
HANDPIECE INTERPULSE COAX TIP (DISPOSABLE) ×1
HEMOSTAT SPONGE AVITENE ULTRA (HEMOSTASIS) ×2 IMPLANT
IMMOBILIZER KNEE 20 (SOFTGOODS) ×4 IMPLANT
IMMOBILIZER KNEE 20 THIGH 36 (SOFTGOODS) ×1 IMPLANT
MANIFOLD NEPTUNE II (INSTRUMENTS) ×2 IMPLANT
NEEDLE HYPO 21X1.5 SAFETY (NEEDLE) ×2 IMPLANT
NEEDLE HYPO 22GX1.5 SAFETY (NEEDLE) ×2 IMPLANT
NS IRRIG 1000ML POUR BTL (IV SOLUTION) IMPLANT
PACK TOTAL KNEE CUSTOM (KITS) ×2 IMPLANT
PAD ABD 8X10 STRL (GAUZE/BANDAGES/DRESSINGS) ×2 IMPLANT
PADDING CAST COTTON 6X4 STRL (CAST SUPPLIES) ×2 IMPLANT
PENCIL SMOKE EVAC W/HOLSTER (ELECTROSURGICAL) IMPLANT
POSITIONER SURGICAL ARM (MISCELLANEOUS) ×2 IMPLANT
SET HNDPC FAN SPRY TIP SCT (DISPOSABLE) ×1 IMPLANT
SET PAD KNEE POSITIONER (MISCELLANEOUS) ×2 IMPLANT
SPONGE LAP 18X18 X RAY DECT (DISPOSABLE) IMPLANT
STRIP CLOSURE SKIN 1/2X4 (GAUZE/BANDAGES/DRESSINGS) ×4 IMPLANT
SUT BONE WAX W31G (SUTURE) IMPLANT
SUT MNCRL AB 4-0 PS2 18 (SUTURE) ×2 IMPLANT
SUT STRATAFIX 0 PDS 27 VIOLET (SUTURE) ×2
SUT VIC AB 1 CT1 27 (SUTURE) ×1
SUT VIC AB 1 CT1 27XBRD ANTBC (SUTURE) ×1 IMPLANT
SUT VIC AB 2-0 CT1 27 (SUTURE) ×3
SUT VIC AB 2-0 CT1 TAPERPNT 27 (SUTURE) ×3 IMPLANT
SUTURE STRATFX 0 PDS 27 VIOLET (SUTURE) ×1 IMPLANT
SYR 20CC LL (SYRINGE) ×4 IMPLANT
TOWER CARTRIDGE SMART MIX (DISPOSABLE) ×2 IMPLANT
TRAY FOLEY W/METER SILVER 16FR (SET/KITS/TRAYS/PACK) ×2 IMPLANT
WATER STERILE IRR 1000ML POUR (IV SOLUTION) IMPLANT
WRAP KNEE MAXI GEL POST OP (GAUZE/BANDAGES/DRESSINGS) ×2 IMPLANT
YANKAUER SUCT BULB TIP 10FT TU (MISCELLANEOUS) ×2 IMPLANT

## 2018-01-26 NOTE — Anesthesia Procedure Notes (Signed)
Anesthesia Regional Block: Adductor canal block   Pre-Anesthetic Checklist: ,, timeout performed, Correct Patient, Correct Site, Correct Laterality, Correct Procedure, Correct Position, site marked, Risks and benefits discussed,  Surgical consent,  Pre-op evaluation,  At surgeon's request and post-op pain management  Laterality: Left  Prep: chloraprep       Needles:  Injection technique: Single-shot  Needle Type: Echogenic Stimulator Needle     Needle Length: 10cm  Needle Gauge: 21   Needle insertion depth: 9 cm   Additional Needles:   Procedures:,,,, ultrasound used (permanent image in chart),,,,  Narrative:  Start time: 01/26/2018 7:06 AM End time: 01/26/2018 7:11 AM Injection made incrementally with aspirations every 5 mL.  Performed by: Personally  Anesthesiologist: Josephine Igo, MD  Additional Notes: Timeout performed. Patient sedated. Relevant anatomy ID'd using [place]. Incremental 2-74ml injection of LA with frequent aspiration. Patient tolerated procedure well.        Left Adductor Canal Block

## 2018-01-26 NOTE — Addendum Note (Signed)
Addendum  created 01/26/18 1022 by Josephine Igo, MD   Sign clinical note, SmartForm saved

## 2018-01-26 NOTE — Brief Op Note (Signed)
01/26/2018  9:03 AM  PATIENT:  Miranda Perkins  57 y.o. 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 Morbid Obesity  POST-OPERATIVE DIAGNOSIS: Same as Pre-Op  PROCEDURE:  Procedure(s): LEFT TOTAL KNEE ARTHROPLASTY (Left)  SURGEON:  Surgeon(s) and Role:    Latanya Maudlin, MD - Primary  PHYSICIAN ASSISTANT: Ardeen Jourdain PA  ASSISTANTS: Ardeen Jourdain PA   ANESTHESIA:   spinal and Engineer, technical sales.  EBL:  30 mL   BLOOD ADMINISTERED:none  DRAINS: (one) Hemovact drain(s) in the Left Knee with  Suction Open   LOCAL MEDICATIONS USED:  OTHER 20cc of Exparel mixed with 20cc of Normal Saline.  SPECIMEN:  No Specimen  DISPOSITION OF SPECIMEN:  N/A  COUNTS:  YES  TOURNIQUET:  * Missing tourniquet times found for documented tourniquets in log: [id number] *  DICTATION: .Other Dictation: Dictation Number [id number]  PLAN OF CARE: Admit to inpatient   PATIENT DISPOSITION:  Stable in OR   Delay start of Pharmacological VTE agent (>24hrs) due to surgical blood loss or risk of bleeding: yes

## 2018-01-26 NOTE — Transfer of Care (Signed)
Immediate Anesthesia Transfer of Care Note  Patient: Miranda Perkins  Procedure(s) Performed: LEFT TOTAL KNEE ARTHROPLASTY (Left Knee)  Patient Location: PACU  Anesthesia Type:Regional and Spinal  Level of Consciousness: sedated  Airway & Oxygen Therapy: Patient Spontanous Breathing and Patient connected to face mask oxygen  Post-op Assessment: Report given to RN and Post -op Vital signs reviewed and stable  Post vital signs: Reviewed and stable  Last Vitals:  Vitals Value Taken Time  BP    Temp    Pulse 73 01/26/2018  9:34 AM  Resp 15 01/26/2018  9:34 AM  SpO2 99 % 01/26/2018  9:34 AM  Vitals shown include unvalidated device data.  Last Pain:  Vitals:   01/26/18 0600  TempSrc: Oral  PainSc: 0-No pain      Patients Stated Pain Goal: 4 (95/62/13 0865)  Complications: No apparent anesthesia complications

## 2018-01-26 NOTE — Anesthesia Procedure Notes (Signed)
Spinal  Patient location during procedure: OR Start time: 01/26/2018 7:32 AM End time: 01/26/2018 7:34 AM Staffing Resident/CRNA: Lind Covert, CRNA Performed: resident/CRNA  Preanesthetic Checklist Completed: patient identified, site marked, surgical consent, pre-op evaluation, timeout performed, IV checked, risks and benefits discussed and monitors and equipment checked Spinal Block Patient position: sitting Prep: Betadine Patient monitoring: heart rate, cardiac monitor, continuous pulse ox and blood pressure Approach: midline Location: L3-4 Injection technique: single-shot Needle Needle type: Pencan  Needle gauge: 24 G Needle length: 10 cm Needle insertion depth: 7 cm Assessment Sensory level: T6 Additional Notes Timeout performed. SAB kit date checked. SAB without difficulty

## 2018-01-26 NOTE — Interval H&P Note (Signed)
History and Physical Interval Note:  01/26/2018 7:21 AM  Miranda Perkins  has presented today for surgery, with the diagnosis of Left Knee Osteoarthritis  The various methods of treatment have been discussed with the patient and family. After consideration of risks, benefits and other options for treatment, the patient has consented to  Procedure(s): LEFT TOTAL KNEE ARTHROPLASTY (Left) as a surgical intervention .  The patient's history has been reviewed, patient examined, no change in status, stable for surgery.  I have reviewed the patient's chart and labs.  Questions were answered to the patient's satisfaction.     Latanya Maudlin

## 2018-01-26 NOTE — Anesthesia Postprocedure Evaluation (Addendum)
Anesthesia Post Note  Patient: Miranda Perkins  Procedure(s) Performed: LEFT TOTAL KNEE ARTHROPLASTY (Left Knee)     Patient location during evaluation: PACU Anesthesia Type: Spinal Level of consciousness: awake and alert and oriented Pain management: pain level controlled Vital Signs Assessment: post-procedure vital signs reviewed and stable Respiratory status: nonlabored ventilation, spontaneous breathing and respiratory function stable Cardiovascular status: stable and blood pressure returned to baseline Postop Assessment: no apparent nausea or vomiting, patient able to bend at knees, spinal receding, no backache and no headache Anesthetic complications: no    Last Vitals:  Vitals:   01/26/18 1000 01/26/18 1015  BP: 108/67 133/81  Pulse: (!) 59 63  Resp: 12 12  Temp:    SpO2: 100% 100%    Last Pain:  Vitals:   01/26/18 0933  TempSrc:   PainSc: 0-No pain                 Quintan Saldivar A.

## 2018-01-26 NOTE — Evaluation (Signed)
Physical Therapy Evaluation Patient Details Name: Miranda Perkins MRN: [id number] DOB: Apr 05, 1961 Today's Date: 01/26/2018   History of Present Illness  s/p L TKA  Clinical Impression  Pt is s/p TKA resulting in the deficits listed below (see PT Problem List). Pt doing well today, amb 59' with RW and min assist; will follow, planning for HHPT at d/c Pt will benefit from skilled PT to increase their independence and safety with mobility to allow discharge to the venue listed below.      Follow Up Recommendations Home health PT;Follow surgeon's recommendation for DC plan and follow-up therapies    Equipment Recommendations  Rolling walker with 5" wheels    Recommendations for Other Services       Precautions / Restrictions Precautions Precautions: Knee;Fall Required Braces or Orthoses: Knee Immobilizer - Left Knee Immobilizer - Left: Discontinue once straight leg raise with < 10 degree lag Restrictions Weight Bearing Restrictions: No Other Position/Activity Restrictions: WBAT      Mobility  Bed Mobility Overal bed mobility: Needs Assistance Bed Mobility: Supine to Sit     Supine to sit: Min assist     General bed mobility comments: with LLE  Transfers Overall transfer level: Needs assistance Equipment used: Rolling walker (2 wheeled) Transfers: Sit to/from Stand Sit to Stand: Min assist         General transfer comment: cues for hand placement and LLE position  Ambulation/Gait Ambulation/Gait assistance: Min guard;Min assist Ambulation Distance (Feet): 50 Feet Assistive device: Rolling walker (2 wheeled) Gait Pattern/deviations: Step-to pattern;Decreased stance time - left     General Gait Details: cues for sequence and RW position  Stairs            Wheelchair Mobility    Modified Rankin (Stroke Patients Only)       Balance                                             Pertinent Vitals/Pain Pain Assessment: 0-10 Pain  Score: 8  Pain Location: left knee Pain Descriptors / Indicators: Sore Pain Intervention(s): Limited activity within patient's tolerance;Monitored during session;RN gave pain meds during session    Home Living Family/patient expects to be discharged to:: Private residence Living Arrangements: Spouse/significant other Available Help at Discharge: Available PRN/intermittently Type of Home: House Home Access: Stairs to enter Entrance Stairs-Rails: Right Entrance Stairs-Number of Steps: 4-5 Home Layout: One level Home Equipment: Cane - single point;Crutches Additional Comments: pt  is raising her twin 31 yo grandsons (since her dtr passed away)    Prior Function Level of Independence: Independent;Independent with assistive device(s)         Comments: cane at times     Hand Dominance        Extremity/Trunk Assessment   Upper Extremity Assessment Upper Extremity Assessment: Overall WFL for tasks assessed    Lower Extremity Assessment Lower Extremity Assessment: LLE deficits/detail LLE Deficits / Details: ankle WFL; knee AROM & strength limited by post op pain, weakness       Communication   Communication: No difficulties  Cognition Arousal/Alertness: Awake/alert Behavior During Therapy: WFL for tasks assessed/performed Overall Cognitive Status: Within Functional Limits for tasks assessed  General Comments      Exercises Total Joint Exercises Ankle Circles/Pumps: AROM;Both;10 reps Quad Sets: AROM;Both;5 reps;Limitations Quad Sets Limitations: pain   Assessment/Plan    PT Assessment Patient needs continued PT services  PT Problem List Decreased strength;Decreased range of motion;Decreased activity tolerance;Decreased mobility;Pain       PT Treatment Interventions DME instruction;Gait training;Stair training;Functional mobility training;Therapeutic activities;Therapeutic exercise;Patient/family education     PT Goals (Current goals can be found in the Care Plan section)  Acute Rehab PT Goals Patient Stated Goal: have less pain PT Goal Formulation: With patient Time For Goal Achievement: 01/31/18 Potential to Achieve Goals: Good    Frequency 7X/week   Barriers to discharge        Co-evaluation               AM-PAC PT "6 Clicks" Daily Activity  Outcome Measure Difficulty turning over in bed (including adjusting bedclothes, sheets and blankets)?: Unable Difficulty moving from lying on back to sitting on the side of the bed? : Unable Difficulty sitting down on and standing up from a chair with arms (e.g., wheelchair, bedside commode, etc,.)?: Unable Help needed moving to and from a bed to chair (including a wheelchair)?: A Little Help needed walking in hospital room?: A Little Help needed climbing 3-5 steps with a railing? : A Lot 6 Click Score: 11    End of Session Equipment Utilized During Treatment: Gait belt;Left knee immobilizer Activity Tolerance: Patient tolerated treatment well Patient left: in chair;with call bell/phone within reach;with chair alarm set Nurse Communication: Mobility status PT Visit Diagnosis: Difficulty in walking, not elsewhere classified (R26.2)    Time: 4010-2725 PT Time Calculation (min) (ACUTE ONLY): 30 min   Charges:   PT Evaluation $PT Eval Low Complexity: 1 Low     PT G CodesKenyon Ana, PT Pager: [phone] 01/26/2018'   Kenyon Ana 01/26/2018, 3:43 PM

## 2018-01-26 NOTE — Op Note (Signed)
NAMEICIS, Miranda Perkins            ACCOUNT NO.:  [id number]  MEDICAL RECORD NO.:  [id number]  LOCATION:                                 FACILITY:  PHYSICIAN:  Kipp Brood. Desmond Tufano, M.D.DATE OF BIRTH:  03-12-1961  DATE OF PROCEDURE:  01/26/2018 DATE OF DISCHARGE:                              OPERATIVE REPORT   SURGEON:  Kipp Brood. Gladstone Lighter, M.D.  ASSISTANT:  Ardeen Jourdain, [place]  PREOPERATIVE DIAGNOSES: 1. Morbid obesity. 2. Severe primary osteoarthritis with bone-on-bone, left knee. 3. Flexion contracture, all left knee.  POSTOPERATIVE DIAGNOSES: 1. Morbid obesity. 2. Severe primary osteoarthritis with bone-on-bone, left knee. 3. Flexion contracture, all left knee.  OPERATION:  Left total knee arthroplasty utilizing the DePuy system. All 3 components were cemented.  The sizes used was a size 3 left femoral component posterior cruciate sacrificing type.  Tibial tray was a size 2.5.  The insert was a size 3, 12.5 mm thickness.  The patellar button was a size 38 with 3 pegs.  DESCRIPTION OF PROCEDURE:  Under final anesthesia, routine orthopedic prep and draping of left lower extremity was carried out.  Prior to the spinal anesthetic in the holding area, I marked her appropriate left leg.  The anesthesiologist did the adductor block of the left leg.  At this time in surgery after the anesthetic, sterile prep and draping was carried out.  The appropriate time-out was carried out.  Also, as I said time, I have marked the appropriate left leg in the holding area.  The leg then was exsanguinated with an Esmarch, and tourniquet was elevated to 325 mmHg.  We then placed the left leg in a DeMayo knee holder.  The knee was flexed, and an anterior approach to the knee was carried out. Two flaps were created.  At this time, a median parapatellar incision was made.  Patella was reflected laterally.  I then did medial and lateral meniscectomies and excised the anterior and posterior  cruciate ligaments.  At this point, we did extensive synovectomy up in the suprapatellar region.  She had a large amount of thickened synovium.  At that time, we then made our initial drill hole in the intercondylar notch.  Guide rod was inserted, and we were nicely up into the canal. We then thoroughly irrigated out the canal, inserted our next first jig and made a cut of 12 mm thickness off the distal femur.  At this particular point, we did that outside of 5-degree valgus angle. Following that, we then measured the femur to be a size 3.  We did our anterior, posterior, and chamfering cuts for a size 3 femoral component. At this particular time, we then removed the spinous processes from the tibia.  We then used the external guide for the tibia and removed the appropriate amount of bone from the tibial plateau.  At this point, we then inserted our lamina spreaders and completed our debridement.  There were no spurs.  We then inserted our trial spacer blocks and selected the 12.5 mm spacer block.  At this particular time, we then removed the spacer blocks and completed our keel cut out of the proximal tibial plateau then did our notch cut out  of the distal femur.  We then inserted our trial components and felt that a 12.5 mm thickness insert was the best fit.  At this particular point after doing this, we then did a resurfacing procedure on the patella for a size 38 patella.  Three drill holes were made in the patella.  We then removed all components, thoroughly water picked out the knee, dried the knee out, and cemented all 3 components in simultaneously.  After the cement was hardened, we removed all loose pieces of cement.  We had a very nice fit and good stability.  At this particular point, we then removed the trial component and inserted our permanent tibial component size 3, 12.5 mm thickness and reduced the knee and had an excellent function again.  We had good medial and lateral  stability.  Good flexion and extension.  We irrigated out the knee again with antibiotic and inserted a Hemovac drain.  Prior to doing that, I did insert some thrombin-soaked Gelfoam posteriorly into the soft tissue areas.  Note, we did double check again to make sure there were no loose pieces of cement and there were not. Wound then was closed in the usual fashion over a Hemovac drain.  The patient preop had 1 g of vancomycin.  She will be admitted overnight.    ______________________________ Kipp Brood. Mikenna Bunkley, M.D.   ______________________________ Kipp Brood. Gladstone Lighter, M.D.    RAG/MEDQ  D:  01/26/2018  T:  01/26/2018  Job:  [id number]

## 2018-01-27 LAB — BASIC METABOLIC PANEL
Anion gap: 9 (ref 5–15)
BUN: 11 mg/dL (ref 6–20)
CALCIUM: 9 mg/dL (ref 8.9–10.3)
CO2: 25 mmol/L (ref 22–32)
CREATININE: 0.67 mg/dL (ref 0.44–1.00)
Chloride: 107 mmol/L (ref 101–111)
Glucose, Bld: 154 mg/dL — ABNORMAL HIGH (ref 65–99)
Potassium: 4.1 mmol/L (ref 3.5–5.1)
SODIUM: 141 mmol/L (ref 135–145)

## 2018-01-27 LAB — CBC
HCT: 38.7 % (ref 36.0–46.0)
HEMOGLOBIN: 12.7 g/dL (ref 12.0–15.0)
MCH: 28.9 pg (ref 26.0–34.0)
MCHC: 32.8 g/dL (ref 30.0–36.0)
MCV: 88 fL (ref 78.0–100.0)
Platelets: 255 10*3/uL (ref 150–400)
RBC: 4.4 MIL/uL (ref 3.87–5.11)
RDW: 14 % (ref 11.5–15.5)
WBC: 13.2 10*3/uL — ABNORMAL HIGH (ref 4.0–10.5)

## 2018-01-27 MED ORDER — METHOCARBAMOL 500 MG PO TABS: 500.0000 mg | ORAL_TABLET | Freq: Four times a day (QID) | ORAL | 0 refills | Status: DC | PRN

## 2018-01-27 MED ORDER — HYDROMORPHONE HCL 2 MG PO TABS
2.0000 mg | ORAL_TABLET | ORAL | Status: DC | PRN
  Administered 2018-01-27 – 2018-01-28 (×3): 2 mg via ORAL
  Filled 2018-01-27 (×3): qty 1

## 2018-01-27 MED ORDER — DIPHENHYDRAMINE HCL 12.5 MG/5ML PO ELIX
12.5000 mg | ORAL_SOLUTION | Freq: Four times a day (QID) | ORAL | Status: DC | PRN
  Administered 2018-01-27 – 2018-01-28 (×3): 12.5 mg via ORAL
  Filled 2018-01-27 (×3): qty 5

## 2018-01-27 MED ORDER — OXYCODONE HCL 5 MG PO TABS: 5.0000 mg | ORAL_TABLET | ORAL | 0 refills | Status: DC | PRN

## 2018-01-27 NOTE — Progress Notes (Signed)
Physical Therapy Treatment Patient Details Name: Miranda Perkins MRN: [id number] DOB: 06-Aug-1961 Today's Date: 01/27/2018    History of Present Illness s/p L TKA    PT Comments    POD # 1 pm session Assisted with amb a greater distance in hallway.  Assisted to bathroom then back to bed per pt request.  Pt plans to D/C tomorrow.    Follow Up Recommendations  Home health PT;Follow surgeon's recommendation for DC plan and follow-up therapies     Equipment Recommendations  Rolling walker with 5" wheels    Recommendations for Other Services       Precautions / Restrictions Precautions Precautions: Knee;Fall Precaution Comments: did not need to use KI Restrictions Weight Bearing Restrictions: No Other Position/Activity Restrictions: WBAT    Mobility  Bed Mobility Overal bed mobility: Needs Assistance Bed Mobility: Sit to Supine     Supine to sit: Min assist Sit to supine: Mod assist   General bed mobility comments: assisted back to bed  Transfers Overall transfer level: Needs assistance Equipment used: Rolling walker (2 wheeled) Transfers: Sit to/from Stand Sit to Stand: Min assist         General transfer comment: 25% cues for LE placement and safety with turns  Ambulation/Gait Ambulation/Gait assistance: Min guard;Min assist Ambulation Distance (Feet): 68 Feet Assistive device: Rolling walker (2 wheeled) Gait Pattern/deviations: Step-to pattern;Decreased stance time - left Gait velocity: decreased   General Gait Details: cues for sequence and RW position plus safety with turns   Stairs            Wheelchair Mobility    Modified Rankin (Stroke Patients Only)       Balance                                            Cognition Arousal/Alertness: Awake/alert Behavior During Therapy: WFL for tasks assessed/performed Overall Cognitive Status: Within Functional Limits for tasks assessed                                         Exercises      General Comments        Pertinent Vitals/Pain Pain Assessment: 0-10 Pain Score: 5  Pain Location: left knee Pain Descriptors / Indicators: Sore;Operative site guarding Pain Intervention(s): Monitored during session;Premedicated before session;Repositioned;Ice applied    Home Living                      Prior Function            PT Goals (current goals can now be found in the care plan section) Progress towards PT goals: Progressing toward goals    Frequency    7X/week      PT Plan Current plan remains appropriate    Co-evaluation              AM-PAC PT "6 Clicks" Daily Activity  Outcome Measure  Difficulty turning over in bed (including adjusting bedclothes, sheets and blankets)?: A Lot Difficulty moving from lying on back to sitting on the side of the bed? : A Lot Difficulty sitting down on and standing up from a chair with arms (e.g., wheelchair, bedside commode, etc,.)?: A Lot Help needed moving to and from a bed to chair (including a wheelchair)?:  A Lot Help needed walking in hospital room?: A Lot Help needed climbing 3-5 steps with a railing? : Total 6 Click Score: 11    End of Session Equipment Utilized During Treatment: Gait belt Activity Tolerance: Patient tolerated treatment well Patient left: in chair;with call bell/phone within reach;with chair alarm set Nurse Communication: Mobility status PT Visit Diagnosis: Difficulty in walking, not elsewhere classified (R26.2)     Time: 9480-1655 PT Time Calculation (min) (ACUTE ONLY): 27 min  Charges:   1 gt   1 ta                   G Codes:       Rica Koyanagi  PTA [facility]  Acute  Rehab Pager      [phone]

## 2018-01-27 NOTE — Progress Notes (Signed)
Patient complains of feeling shaky and feeling warrm, vital signs stable, face and neck red and flushed, A Constable notified with orders received. Bethann Punches RN

## 2018-01-27 NOTE — Evaluation (Signed)
Occupational Therapy Evaluation Patient Details Name: Miranda Perkins MRN: [id number] DOB: [birth year] Today's Date: 01/27/2018    History of Present Illness s/p L TKA   Clinical Impression   Pt was admitted for the above. Will follow in acute setting with min guard to supervision level goals.  Pt was mod I prior to admission    Follow Up Recommendations  Supervision - Intermittent    Equipment Recommendations  3 in 1 bedside commode    Recommendations for Other Services       Precautions / Restrictions Precautions Precautions: Knee;Fall Required Braces or Orthoses: Knee Immobilizer - Left Knee Immobilizer - Left: Discontinue once straight leg raise with < 10 degree lag Restrictions Other Position/Activity Restrictions: WBAT      Mobility Bed Mobility         Supine to sit: Min assist     General bed mobility comments: with LLE  Transfers   Equipment used: Rolling walker (2 wheeled)   Sit to Stand: Min assist         General transfer comment: cues for LE placement    Balance                                           ADL either performed or assessed with clinical judgement   ADL Overall ADL's : Needs assistance/impaired     Grooming: Wash/dry hands;Supervision/safety;Standing       Lower Body Bathing: Moderate assistance;Sit to/from stand       Lower Body Dressing: Maximal assistance;Sit to/from stand   Toilet Transfer: Minimal assistance;Ambulation;BSC;RW   Toileting- Water quality scientist and Hygiene: Min guard;Sit to/from stand         General ADL Comments: pt is able to perform UB adls with set up.  She has a Secondary school teacher at home and a long brush for her back, which she can use on her feet.     Vision         Perception     Praxis      Pertinent Vitals/Pain Pain Score: 8  Pain Location: left knee Pain Descriptors / Indicators: Sore Pain Intervention(s): Limited activity within patient's tolerance;Monitored  during session;Premedicated before session;Repositioned;Ice applied     Hand Dominance     Extremity/Trunk Assessment Upper Extremity Assessment Upper Extremity Assessment: Overall WFL for tasks assessed           Communication Communication Communication: No difficulties   Cognition Arousal/Alertness: Awake/alert Behavior During Therapy: WFL for tasks assessed/performed Overall Cognitive Status: Within Functional Limits for tasks assessed                                     General Comments       Exercises     Shoulder Instructions      Home Living Family/patient expects to be discharged to:: Private residence Living Arrangements: Spouse/significant other Available Help at Discharge: Available PRN/intermittently               Bathroom Shower/Tub: Walk-in shower   Bathroom Toilet: Standard     Home Equipment: Cane - single point;Crutches   Additional Comments: pt  is raising her twin 75 yo grandsons (since her dtr passed away). husband works a split shift. built in seat in shower is too low      Prior Functioning/Environment  Level of Independence: Independent;Independent with assistive device(s)        Comments: cane at times        OT Problem List:        OT Treatment/Interventions: Self-care/ADL training;DME and/or AE instruction;Patient/family education;Therapeutic activities    OT Goals(Current goals can be found in the care plan section) Acute Rehab OT Goals Patient Stated Goal: have less pain OT Goal Formulation: With patient Time For Goal Achievement: 02/10/18 Potential to Achieve Goals: Good ADL Goals Pt Will Perform Lower Body Bathing: with min guard assist;with adaptive equipment;sit to/from stand Pt Will Perform Lower Body Dressing: with min guard assist;sit to/from stand;with adaptive equipment(underwear with reacher) Pt Will Transfer to Toilet: with min guard assist;ambulating;bedside commode Pt Will Perform  Toileting - Clothing Manipulation and hygiene: with supervision;sit to/from stand Pt Will Perform Tub/Shower Transfer: Shower transfer;with min guard assist;ambulating;3 in 1  OT Frequency: Min 2X/week   Barriers to D/C:            Co-evaluation              AM-PAC PT "6 Clicks" Daily Activity     Outcome Measure Help from another person eating meals?: None Help from another person taking care of personal grooming?: A Little Help from another person toileting, which includes using toliet, bedpan, or urinal?: A Little Help from another person bathing (including washing, rinsing, drying)?: A Lot Help from another person to put on and taking off regular upper body clothing?: A Little Help from another person to put on and taking off regular lower body clothing?: A Little 6 Click Score: 18   End of Session    Activity Tolerance: Patient tolerated treatment well Patient left: in bed;with call bell/phone within reach  OT Visit Diagnosis: Pain Pain - Right/Left: Left Pain - part of body: Knee                Time: 2671-2458 OT Time Calculation (min): 17 min Charges:  OT General Charges $OT Visit: 1 Visit OT Evaluation $OT Eval Low Complexity: 1 Low G-Codes:     Miranda Perkins, OTR/L [phone] 01/27/2018  Miranda Perkins 01/27/2018, 9:57 AM

## 2018-01-27 NOTE — Progress Notes (Signed)
Physical Therapy Treatment Patient Details Name: Miranda Perkins MRN: [id number] DOB: 1961-01-07 Today's Date: 01/27/2018    History of Present Illness s/p L TKA    PT Comments    POD # 1 am session Assisted OOB to amb to bathroom then in hallway an increased tie and VC's safety.  Returned to room then performed some TKR TE's following handout HEP.  Instructed on proper tech as well as use of ICE.   Follow Up Recommendations  Home health PT;Follow surgeon's recommendation for DC plan and follow-up therapies     Equipment Recommendations  Rolling walker with 5" wheels    Recommendations for Other Services       Precautions / Restrictions Precautions Precautions: Knee;Fall Precaution Comments: did not need to use KI Restrictions Weight Bearing Restrictions: No Other Position/Activity Restrictions: WBAT    Mobility  Bed Mobility Overal bed mobility: Needs Assistance Bed Mobility: Supine to Sit     Supine to sit: Min assist     General bed mobility comments: with LLE and increased time   Transfers Overall transfer level: Needs assistance Equipment used: Rolling walker (2 wheeled) Transfers: Sit to/from Stand Sit to Stand: Min assist         General transfer comment: 25% cues for LE placement and safety with turns  Ambulation/Gait Ambulation/Gait assistance: Min guard;Min assist Ambulation Distance (Feet): 55 Feet Assistive device: Rolling walker (2 wheeled) Gait Pattern/deviations: Step-to pattern;Decreased stance time - left Gait velocity: decreased   General Gait Details: cues for sequence and RW position plus safety with turns   Science writer    Modified Rankin (Stroke Patients Only)       Balance                                            Cognition Arousal/Alertness: Awake/alert Behavior During Therapy: WFL for tasks assessed/performed Overall Cognitive Status: Within Functional Limits  for tasks assessed                                        Exercises      General Comments        Pertinent Vitals/Pain Pain Assessment: 0-10 Pain Score: 5  Pain Location: left knee Pain Descriptors / Indicators: Sore;Operative site guarding Pain Intervention(s): Monitored during session;Premedicated before session;Repositioned;Ice applied    Home Living                      Prior Function            PT Goals (current goals can now be found in the care plan section) Progress towards PT goals: Progressing toward goals    Frequency    7X/week      PT Plan Current plan remains appropriate    Co-evaluation              AM-PAC PT "6 Clicks" Daily Activity  Outcome Measure  Difficulty turning over in bed (including adjusting bedclothes, sheets and blankets)?: A Lot Difficulty moving from lying on back to sitting on the side of the bed? : A Lot Difficulty sitting down on and standing up from a chair with arms (e.g., wheelchair, bedside commode, etc,.)?: A Lot Help needed  moving to and from a bed to chair (including a wheelchair)?: A Lot Help needed walking in hospital room?: A Lot Help needed climbing 3-5 steps with a railing? : Total 6 Click Score: 11    End of Session Equipment Utilized During Treatment: Gait belt Activity Tolerance: Patient tolerated treatment well Patient left: in chair;with call bell/phone within reach;with chair alarm set Nurse Communication: Mobility status PT Visit Diagnosis: Difficulty in walking, not elsewhere classified (R26.2)     Time: 1000-1039 PT Time Calculation (min) (ACUTE ONLY): 39 min  Charges:  $Gait Training: 8-22 mins $Therapeutic Exercise: 8-22 mins $Therapeutic Activity: 8-22 mins                    G Codes:       Rica Koyanagi  PTA [facility]  Acute  Rehab Pager      [phone]

## 2018-01-27 NOTE — Progress Notes (Signed)
   Subjective: 1 Day Post-Op Procedure(s) (LRB): LEFT TOTAL KNEE ARTHROPLASTY (Left) Patient reports pain as mild.   Patient seen in rounds for Dr. Gladstone Lighter. Patient is well, and has had no acute complaints or problems other than discomfort in the left knee. No issues overnight. No SOB or chest pain. Is motivated to get moving with therapy.   Objective: Vital signs in last 24 hours: Temp:  [97.1 F (36.2 C)-98 F (36.7 C)] 98 F (36.7 C) (03/28 0624) Pulse Rate:  [58-94] 87 (03/28 0624) Resp:  [12-17] 16 (03/28 0624) BP: (96-143)/(48-90) 117/64 (03/28 0624) SpO2:  [94 %-100 %] 96 % (03/28 0624) Weight:  [104.8 kg (231 lb)] 104.8 kg (231 lb) (03/27 1321)  Intake/Output from previous day:  Intake/Output Summary (Last 24 hours) at 01/27/2018 0753 Last data filed at 01/27/2018 5277 Gross per 24 hour  Intake 3618.33 ml  Output 5730 ml  Net -2111.67 ml     Labs: Recent Labs    01/27/18 0542  HGB 12.7   Recent Labs    01/27/18 0542  WBC 13.2*  RBC 4.40  HCT 38.7  PLT 255   Recent Labs    01/27/18 0542  NA 141  K 4.1  CL 107  CO2 25  BUN 11  CREATININE 0.67  GLUCOSE 154*  CALCIUM 9.0    EXAM General - Patient is Alert and Oriented Extremity - Neurologically intact Intact pulses distally Dorsiflexion/Plantar flexion intact No cellulitis present Compartment soft Dressing - dressing C/D/I Motor Function - intact, moving foot and toes well on exam.  Hemovac pulled without difficulty.  Past Medical History:  Diagnosis Date  . 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 Carpal tunnel syndrome    BILATERAL HANDS  . Depression   . Osteopenia 12/2017   T score -1.2 FRAX 4.9% / 0.3%  . Stroke ([place]) 1993   while on Chloramphenicol, no residuaL; [MEDICATION WAS GIVEN TO TREAT TICK BITE] ; patient states  "i stroked out right next to my doctor , he said i passed out and that was my only symptom" ;      Assessment/Plan: 1 Day Post-Op Procedure(s) (LRB): LEFT TOTAL KNEE ARTHROPLASTY (Left) Active Problems:   Hx of total knee arthroplasty, left  Estimated body mass index is 38.44 kg/m as calculated from the following:   Height as of this encounter: 5\' 5"  (1.651 m).   Weight as of this encounter: 104.8 kg (231 lb). Advance diet Up with therapy D/C IV fluids when tolerating POs well  DVT Prophylaxis - Xarelto Weight-Bearing as tolerated  D/C O2 and Pulse OX and try on Room Air  Marvetta will get up with therapy today. Will continue to monitor labs. Plan for DC home tomorrow with HHPT.   Ardeen Jourdain, PA-C Orthopaedic Surgery 01/27/2018, 7:53 AM

## 2018-01-27 NOTE — Progress Notes (Signed)
Discharge planning, spoke with patient at bedside. Have chosen [facility] for HH PT, evaluate and treat. Contacted [facility] for referral. Needs RW and 3n1, contacted [facility] to deliver to room. [phone] 

## 2018-01-28 LAB — BASIC METABOLIC PANEL
Anion gap: 7 (ref 5–15)
BUN: 11 mg/dL (ref 6–20)
CALCIUM: 8.7 mg/dL — AB (ref 8.9–10.3)
CHLORIDE: 108 mmol/L (ref 101–111)
CO2: 27 mmol/L (ref 22–32)
CREATININE: 0.58 mg/dL (ref 0.44–1.00)
GFR calc non Af Amer: >60 mL/min (ref >60)
Glucose, Bld: 118 mg/dL — ABNORMAL HIGH (ref 65–99)
Potassium: 4 mmol/L (ref 3.5–5.1)
Sodium: 142 mmol/L (ref 135–145)

## 2018-01-28 LAB — CBC
HEMATOCRIT: 37.6 % (ref 36.0–46.0)
HEMOGLOBIN: 12.2 g/dL (ref 12.0–15.0)
MCH: 28.6 pg (ref 26.0–34.0)
MCHC: 32.4 g/dL (ref 30.0–36.0)
MCV: 88.3 fL (ref 78.0–100.0)
Platelets: 211 10*3/uL (ref 150–400)
RBC: 4.26 MIL/uL (ref 3.87–5.11)
RDW: 14.4 % (ref 11.5–15.5)
WBC: 11.6 10*3/uL — ABNORMAL HIGH (ref 4.0–10.5)

## 2018-01-28 MED ORDER — METHOCARBAMOL 500 MG PO TABS: 500.0000 mg | ORAL_TABLET | Freq: Four times a day (QID) | ORAL | 0 refills | Status: DC | PRN

## 2018-01-28 MED ORDER — HYDROMORPHONE HCL 2 MG PO TABS: 2.0000 mg | ORAL_TABLET | ORAL | 0 refills | Status: DC | PRN

## 2018-01-28 MED ORDER — ASPIRIN EC 325 MG PO TBEC: 325.0000 mg | DELAYED_RELEASE_TABLET | Freq: Two times a day (BID) | ORAL | 0 refills | Status: DC

## 2018-01-28 NOTE — Progress Notes (Signed)
   Subjective: 2 Days Post-Op Procedure(s) (LRB): LEFT TOTAL KNEE ARTHROPLASTY (Left) Patient reports pain as mild.   Patient seen in rounds for Dr. Gladstone Lighter. Patient is well, and has had no acute complaints or problems other than discomfort in the left knee. She reports that her flushing and itching has improved. Voiding well. Positive flatus.    Objective: Vital signs in last 24 hours: Temp:  [97.8 F (36.6 C)-100.9 F (38.3 C)] 98.2 F (36.8 C) (03/29 0422) Pulse Rate:  [90-118] 99 (03/29 0422) Resp:  [15-20] 15 (03/29 0422) BP: (120-158)/(65-83) 150/83 (03/29 0422) SpO2:  [99 %-100 %] 100 % (03/29 0422)  Intake/Output from previous day:  Intake/Output Summary (Last 24 hours) at 01/28/2018 0712 Last data filed at 01/28/2018 0432 Gross per 24 hour  Intake 2054.17 ml  Output 1151 ml  Net 903.17 ml     Labs: Recent Labs    01/27/18 0542 01/28/18 0533  HGB 12.7 12.2   Recent Labs    01/27/18 0542 01/28/18 0533  WBC 13.2* 11.6*  RBC 4.40 4.26  HCT 38.7 37.6  PLT 255 211   Recent Labs    01/27/18 0542 01/28/18 0533  NA 141 142  K 4.1 4.0  CL 107 108  CO2 25 27  BUN 11 11  CREATININE 0.67 0.58  GLUCOSE 154* 118*  CALCIUM 9.0 8.7*    EXAM General - Patient is Alert and Oriented Extremity - Neurologically intact Intact pulses distally Dorsiflexion/Plantar flexion intact No cellulitis present Compartment soft Dressing/Incision - clean, dry, no drainage Motor Function - intact, moving foot and toes well on exam.   Past Medical History:  Diagnosis Date  . 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 Carpal tunnel syndrome    BILATERAL HANDS  . Depression   . Osteopenia 12/2017   T score -1.2 FRAX 4.9% / 0.3%  . Stroke ([place]) 1993   while on Chloramphenicol, no residuaL; [MEDICATION WAS GIVEN TO TREAT TICK BITE] ; patient states  "i stroked out right next to my doctor , he said i passed out and that was my only  symptom" ;     Assessment/Plan: 2 Days Post-Op Procedure(s) (LRB): LEFT TOTAL KNEE ARTHROPLASTY (Left) Active Problems:   Hx of total knee arthroplasty, left  Estimated body mass index is 38.44 kg/m as calculated from the following:   Height as of this encounter: 5\' 5"  (1.651 m).   Weight as of this encounter: 104.8 kg (231 lb). Advance diet Up with therapy Discharge home with home health  DVT Prophylaxis - Xarelto; will transition to aspirin upon discharge Weight-Bearing as tolerated   Nelma is doing much better this morning. Flushing resolving with adjustment of medications and addition of benadryl. Will continue with therapy this morning. Plan for DC home with HHPT today. Follow up in 2 weeks.   Ardeen Jourdain, PA-C Orthopaedic Surgery 01/28/2018, 7:12 AM

## 2018-01-28 NOTE — Progress Notes (Signed)
Occupational Therapy Treatment Patient Details Name: Miranda Perkins MRN: [id number] DOB: 1960/12/17 Today's Date: 01/28/2018    History of present illness s/p L TKA   OT comments  Pt limited by pain this am. She verbalizes understanding of all education.  Simulated shower ledge:  She would not be able to get over a 4" ledge at this time (may be due to pain)  Follow Up Recommendations  Supervision - Intermittent    Equipment Recommendations  3 in 1 bedside commode    Recommendations for Other Services      Precautions / Restrictions Precautions Precautions: Knee;Fall Precaution Comments: used KI:  pt not able to do SLR this am Required Braces or Orthoses: Knee Immobilizer - Left Knee Immobilizer - Left: Discontinue once straight leg raise with < 10 degree lag Restrictions Weight Bearing Restrictions: No Other Position/Activity Restrictions: WBAT       Mobility Bed Mobility         Supine to sit: Min guard     General bed mobility comments: using leg lifter; pt did hold to grab bar to pull herself up to sitting  Transfers   Equipment used: Rolling walker (2 wheeled)   Sit to Stand: Min guard;Min assist         General transfer comment: min A from bed and min guard from 3:1 commode    Balance                                           ADL either performed or assessed with clinical judgement   ADL                                   Tub/ Shower Transfer: Min guard;Ambulation;3 in 1;Walk-in shower     General ADL Comments: simulated shower transfer due to pain.  Pt would have a hard time clearing 4" ledge at this time; able to lift about 2". Gave handout on sequence. Reviewed KI application, if needed. She did need the support today.  Pt wants to get a leg lifter as this decreased pain with bed mob as well as lifting leg for KI.  Reviewed donning underwear wtih reacher; pt did not want to do them at this time.        Vision       Perception     Praxis      Cognition Arousal/Alertness: Awake/alert Behavior During Therapy: WFL for tasks assessed/performed Overall Cognitive Status: Within Functional Limits for tasks assessed                                          Exercises     Shoulder Instructions       General Comments      Pertinent Vitals/ Pain       Pain Score: 7  Pain Location: left knee Pain Descriptors / Indicators: Aching Pain Intervention(s): Limited activity within patient's tolerance;Monitored during session;Premedicated before session;Repositioned;Ice applied  Home Living                                          Prior Functioning/Environment  Frequency           Progress Toward Goals  OT Goals(current goals can now be found in the care plan section)  Progress towards OT goals: Progressing toward goals(will not need further OT after d/c)     Plan      Co-evaluation                 AM-PAC PT "6 Clicks" Daily Activity     Outcome Measure   Help from another person eating meals?: None Help from another person taking care of personal grooming?: A Little Help from another person toileting, which includes using toliet, bedpan, or urinal?: A Little Help from another person bathing (including washing, rinsing, drying)?: A Lot Help from another person to put on and taking off regular upper body clothing?: A Little Help from another person to put on and taking off regular lower body clothing?: A Little 6 Click Score: 18    End of Session    OT Visit Diagnosis: Pain Pain - Right/Left: Left Pain - part of body: Knee   Activity Tolerance Patient limited by pain   Patient Left in chair;with call bell/phone within reach   Nurse Communication          Time: 6834-1962 OT Time Calculation (min): 26 min  Charges: OT General Charges $OT Visit: 1 Visit OT Treatments $Self Care/Home Management  : 23-37 mins  Lesle Chris, OTR/L [phone] 01/28/2018   [place] 01/28/2018, 8:39 AM

## 2018-01-31 NOTE — Discharge Summary (Signed)
Physician Discharge Summary   Patient ID: Miranda Perkins MRN: [id number] DOB/AGE: 1961/10/19 57 y.o.  Admit date: 01/26/2018 Discharge date: 01/28/2018  Primary Diagnosis: Primary osteoarthritis left knee   Admission Diagnoses:  Past Medical History:  Diagnosis Date  . Cancer ([place]) 2016   basal cell, forehead  . 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 Carpal tunnel syndrome    BILATERAL HANDS  . Depression   . Osteopenia 12/2017   T score -1.2 FRAX 4.9% / 0.3%  . Stroke ([place]) 1993   while on Chloramphenicol, no residuaL; [MEDICATION WAS GIVEN TO TREAT TICK BITE] ; patient states  "i stroked out right next to my doctor , he said i passed out and that was my only symptom" ;    Discharge Diagnoses:   Active Problems:   Hx of total knee arthroplasty, left  Estimated body mass index is 38.44 kg/m as calculated from the following:   Height as of this encounter: '5\' 5"'$  (1.651 m).   Weight as of this encounter: 104.8 kg (231 lb).  Procedure:  Procedure(s) (LRB): LEFT TOTAL KNEE ARTHROPLASTY (Left)   Consults: None  HPI: Miranda Perkins, 57 y.o. female, has a history of pain and functional disability in the left knee due to arthritis and has failed non-surgical conservative treatments for greater than 12 weeks to include NSAID's and/or analgesics, flexibility and strengthening excercises and activity modification.  Onset of symptoms was gradual, starting 3 years ago with gradually worsening course since that time. The patient noted prior procedures on the knee to include  arthroscopy and menisectomy on the left knee(s).  Patient currently rates pain in the left knee(s) at 8 out of 10 with activity. Patient has night pain, worsening of pain with activity and weight bearing, pain that interferes with activities of daily living, pain with passive range of motion, crepitus and joint swelling.  Patient has evidence of periarticular osteophytes and joint space narrowing by imaging  studies. There is no active infection.    Laboratory Data: Admission on 01/26/2018, Discharged on 01/28/2018  Component Date Value Ref Range Status  . WBC 01/27/2018 13.2* 4.0 - 10.5 K/uL Final  . RBC 01/27/2018 4.40  3.87 - 5.11 MIL/uL Final  . Hemoglobin 01/27/2018 12.7  12.0 - 15.0 g/dL Final  . HCT 01/27/2018 38.7  36.0 - 46.0 % Final  . MCV 01/27/2018 88.0  78.0 - 100.0 fL Final  . MCH 01/27/2018 28.9  26.0 - 34.0 pg Final  . MCHC 01/27/2018 32.8  30.0 - 36.0 g/dL Final  . RDW 01/27/2018 14.0  11.5 - 15.5 % Final  . Platelets 01/27/2018 255  150 - 400 K/uL Final   Performed at [facility], [place] [address], [place], [place] [address]  . Sodium 01/27/2018 141  135 - 145 mmol/L Final  . Potassium 01/27/2018 4.1  3.5 - 5.1 mmol/L Final  . Chloride 01/27/2018 107  101 - 111 mmol/L Final  . CO2 01/27/2018 25  22 - 32 mmol/L Final  . Glucose, Bld 01/27/2018 154* 65 - 99 mg/dL Final  . BUN 01/27/2018 11  6 - 20 mg/dL Final  . Creatinine, Ser 01/27/2018 0.67  0.44 - 1.00 mg/dL Final  . Calcium 01/27/2018 9.0  8.9 - 10.3 mg/dL Final  . GFR calc non Af Amer 01/27/2018 >60  >60 mL/min Final  . GFR calc Af Amer 01/27/2018 >60  >60 mL/min Final   Comment: (NOTE) The eGFR has been calculated  using the CKD EPI equation. This calculation has not been validated in all clinical situations. eGFR's persistently <60 mL/min signify possible Chronic Kidney Disease.   Georgiann Hahn gap 01/27/2018 9  5 - 15 Final   Performed at [facility], [place] [address] [place], [place] [address]  . WBC 01/28/2018 11.6* 4.0 - 10.5 K/uL Final  . RBC 01/28/2018 4.26  3.87 - 5.11 MIL/uL Final  . Hemoglobin 01/28/2018 12.2  12.0 - 15.0 g/dL Final  . HCT 01/28/2018 37.6  36.0 - 46.0 % Final  . MCV 01/28/2018 88.3  78.0 - 100.0 fL Final  . MCH 01/28/2018 28.6  26.0 - 34.0 pg Final  . MCHC 01/28/2018 32.4  30.0 - 36.0 g/dL Final  . RDW 01/28/2018 14.4  11.5 - 15.5 % Final  .  Platelets 01/28/2018 211  150 - 400 K/uL Final   Performed at [facility], [place] [address], [place], [place] [address]  . Sodium 01/28/2018 142  135 - 145 mmol/L Final  . Potassium 01/28/2018 4.0  3.5 - 5.1 mmol/L Final  . Chloride 01/28/2018 108  101 - 111 mmol/L Final  . CO2 01/28/2018 27  22 - 32 mmol/L Final  . Glucose, Bld 01/28/2018 118* 65 - 99 mg/dL Final  . BUN 01/28/2018 11  6 - 20 mg/dL Final  . Creatinine, Ser 01/28/2018 0.58  0.44 - 1.00 mg/dL Final  . Calcium 01/28/2018 8.7* 8.9 - 10.3 mg/dL Final  . GFR calc non Af Amer 01/28/2018 >60  >60 mL/min Final  . GFR calc Af Amer 01/28/2018 >60  >60 mL/min Final   Comment: (NOTE) The eGFR has been calculated using the CKD EPI equation. This calculation has not been validated in all clinical situations. eGFR's persistently <60 mL/min signify possible Chronic Kidney Disease.   Georgiann Hahn gap 01/28/2018 7  5 - 15 Final   Performed at [facility], [place] [address] [place], [place] [address]  Hospital Outpatient Visit on 01/17/2018  Component Date Value Ref Range Status  . aPTT 01/17/2018 35  24 - 36 seconds Final   Performed at [facility], [place] [address] [place], [place] [address]  . WBC 01/17/2018 7.2  4.0 - 10.5 K/uL Final  . RBC 01/17/2018 4.86  3.87 - 5.11 MIL/uL Final  . Hemoglobin 01/17/2018 14.0  12.0 - 15.0 g/dL Final  . HCT 01/17/2018 43.1  36.0 - 46.0 % Final  . MCV 01/17/2018 88.7  78.0 - 100.0 fL Final  . MCH 01/17/2018 28.8  26.0 - 34.0 pg Final  . MCHC 01/17/2018 32.5  30.0 - 36.0 g/dL Final  . RDW 01/17/2018 14.2  11.5 - 15.5 % Final  . Platelets 01/17/2018 300  150 - 400 K/uL Final  . Neutrophils Relative % 01/17/2018 51  % Final  . Neutro Abs 01/17/2018 3.7  1.7 - 7.7 K/uL Final  . Lymphocytes Relative 01/17/2018 39  % Final  . Lymphs Abs 01/17/2018 2.8  0.7 - 4.0 K/uL Final  . Monocytes Relative 01/17/2018 8  % Final  . Monocytes Absolute 01/17/2018  0.6  0.1 - 1.0 K/uL Final  . Eosinophils Relative 01/17/2018 2  % Final  . Eosinophils Absolute 01/17/2018 0.1  0.0 - 0.7 K/uL Final  . Basophils Relative 01/17/2018 0  % Final  . Basophils Absolute 01/17/2018 0.0  0.0 - 0.1 K/uL Final   Performed at [facility], [place] [address] [place], [place] [address]  .  Sodium 01/17/2018 141  135 - 145 mmol/L Final  . Potassium 01/17/2018 4.1  3.5 - 5.1 mmol/L Final  . Chloride 01/17/2018 107  101 - 111 mmol/L Final  . CO2 01/17/2018 24  22 - 32 mmol/L Final  . Glucose, Bld 01/17/2018 95  65 - 99 mg/dL Final  . BUN 01/17/2018 15  6 - 20 mg/dL Final  . Creatinine, Ser 01/17/2018 0.70  0.44 - 1.00 mg/dL Final  . Calcium 01/17/2018 9.3  8.9 - 10.3 mg/dL Final  . Total Protein 01/17/2018 7.2  6.5 - 8.1 g/dL Final  . Albumin 01/17/2018 4.3  3.5 - 5.0 g/dL Final  . AST 01/17/2018 19  15 - 41 U/L Final  . ALT 01/17/2018 23  14 - 54 U/L Final  . Alkaline Phosphatase 01/17/2018 74  38 - 126 U/L Final  . Total Bilirubin 01/17/2018 0.7  0.3 - 1.2 mg/dL Final  . GFR calc non Af Amer 01/17/2018 >60  >60 mL/min Final  . GFR calc Af Amer 01/17/2018 >60  >60 mL/min Final   Comment: (NOTE) The eGFR has been calculated using the CKD EPI equation. This calculation has not been validated in all clinical situations. eGFR's persistently <60 mL/min signify possible Chronic Kidney Disease.   Georgiann Hahn gap 01/17/2018 10  5 - 15 Final   Performed at [facility], [place] [address], [place], [place] [address]  . Prothrombin Time 01/17/2018 12.8  11.4 - 15.2 seconds Final  . INR 01/17/2018 0.97   Final   Performed at [facility] [address] [place], [place] [address]  . ABO/RH(D) 01/17/2018 O POS   Final  . Antibody Screen 01/17/2018 NEG   Final  . Sample Expiration 01/17/2018 01/29/2018   Final  . Extend sample reason 01/17/2018    Final                   Value:NO TRANSFUSIONS OR PREGNANCY IN THE PAST 3  MONTHS Performed at [facility], [place] [address] [place], [place] [address]   . Color, Urine 01/17/2018 STRAW* YELLOW Final  . APPearance 01/17/2018 CLEAR  CLEAR Final  . Specific Gravity, Urine 01/17/2018 1.004* 1.005 - 1.030 Final  . pH 01/17/2018 6.0  5.0 - 8.0 Final  . Glucose, UA 01/17/2018 NEGATIVE  NEGATIVE mg/dL Final  . Hgb urine dipstick 01/17/2018 NEGATIVE  NEGATIVE Final  . Bilirubin Urine 01/17/2018 NEGATIVE  NEGATIVE Final  . Ketones, ur 01/17/2018 NEGATIVE  NEGATIVE mg/dL Final  . Protein, ur 01/17/2018 NEGATIVE  NEGATIVE mg/dL Final  . Nitrite 01/17/2018 NEGATIVE  NEGATIVE Final  . Leukocytes, UA 01/17/2018 NEGATIVE  NEGATIVE Final   Performed at [place] [address] [place], [place] [address]  . MRSA, PCR 01/17/2018 NEGATIVE  NEGATIVE Final  . Staphylococcus aureus 01/17/2018 NEGATIVE  NEGATIVE Final   Comment: (NOTE) The Xpert SA Assay (FDA approved for NASAL specimens in patients 58 years of age and older), is one component of a comprehensive surveillance program. It is not intended to diagnose infection nor to guide or monitor treatment. Performed at [facility], [place] [address], [place], [place] [address]   . ABO/RH(D) 01/17/2018    Final                   Value:O POS Performed at [facility], [place] [address], [place], [place] [address]   Office Visit on 12/21/2017  Component Date Value Ref  Range Status  . Color, Urine 12/21/2017 YELLOW  YELLOW Final  . APPearance 12/21/2017 CLOUDY* CLEAR Final  . Specific Gravity, Urine 12/21/2017 1.010  1.001 - 1.03 Final  . pH 12/21/2017 < OR = 5.0  5.0 - 8.0 Final  . Glucose, UA 12/21/2017 NEGATIVE  NEGATIVE Final  . Bilirubin Urine 12/21/2017 NEGATIVE  NEGATIVE Final  . Ketones, ur 12/21/2017 NEGATIVE  NEGATIVE Final  . Hgb urine dipstick 12/21/2017 NEGATIVE  NEGATIVE Final  . Protein, ur 12/21/2017 NEGATIVE  NEGATIVE Final  .  Nitrites, Initial 12/21/2017 NEGATIVE  NEGATIVE Final  . Leukocyte Esterase 12/21/2017 TRACE* NEGATIVE Final  . WBC, UA 12/21/2017 0-5  0 - 5 /HPF Final  . RBC / HPF 12/21/2017 NONE SEEN  0 - 2 /HPF Final  . Squamous Epithelial / LPF 12/21/2017 0-5  < OR = 5 /HPF Final  . Bacteria, UA 12/21/2017 NONE SEEN  NONE SEEN /HPF Final  . Hyaline Cast 12/21/2017 NONE SEEN  NONE SEEN /LPF Final  . Clinical Information: 12/21/2017    Final   None given  . LMP: 12/21/2017    Final   NONE GIVEN  . PREV. PAP: 12/21/2017    Final   NONE GIVEN  . PREV. BX: 12/21/2017    Final   NONE GIVEN  . HPV DNA Probe-Source 12/21/2017    Final   Cervix, Endocervix  . STATEMENT OF ADEQUACY: 12/21/2017    Final   Comment: Satisfactory for evaluation. Endocervical/transformation zone component present.   . INTERPRETATION/RESULT: 12/21/2017    Final   Negative for intraepithelial lesion or malignancy.  . Comment: 12/21/2017    Final   Comment: This Pap test has been evaluated with computer assisted technology.   . CYTOTECHNOLOGIST: 12/21/2017    Final   Comment: JWW, CT(ASCP) CT screening location: [address], [place], [place] [address]   . HPV DNA High Risk 12/21/2017 Not Detected  Not Detect Final   Comment: This test was performed using the APTIMA HPV Assay ([employer] . This assay detects E6/E7 viral messenger RNA (mRNA) from 14 high-risk HPV types (16,18,31,33,35,39,45,51,52,56,58,59,66,68). . The analytical performance characteristics of this assay have been determined by [facility]. The modifications have not been cleared or approved by the FDA. This assay has been validated pursuant to the CLIA regulations and is used for clinical purposes. EXPLANATORY NOTE:  . The Pap is a screening test for cervical cancer. It is  not a diagnostic test and is subject to false negative  and false positive results. It is most reliable when a  satisfactory sample, regularly  obtained, is submitted  with relevant clinical findings and history, and when  the Pap result is evaluated along with historic and  current clinical information. .   . REFLEXIVE URINE CULTURE 12/21/2017 CULTURE INDICATED - RESULTS TO FOLLOW   Final  . MICRO NUMBER: 12/21/2017 [id number]   Final  . SPECIMEN QUALITY: 12/21/2017 ADEQUATE   Final  . Sample Source 12/21/2017 URINE   Final  . STATUS: 12/21/2017 FINAL   Final  . ISOLATE 1: 12/21/2017 Single organism less than 10,000 CFU/mL isolated. These organisms, commonly found on external and internal genitalia, are considered colonizers. No further testing performed.   Final  . COMMENT: 12/21/2017    Final                   Value:We received a preserved urine culture transport tube and performed a urine culture. If this is not what you  intended to order, please contact your local client service representative immediately so that we can adjust our billing appropriately. You may  also inquire about alternative or additional testing.   Office Visit on 12/15/2017  Component Date Value Ref Range Status  . Glucose 12/15/2017 87  65 - 99 mg/dL Final  . BUN 12/15/2017 14  6 - 24 mg/dL Final  . Creatinine, Ser 12/15/2017 0.79  0.57 - 1.00 mg/dL Final  . GFR calc non Af Amer 12/15/2017 83  >59 mL/min/1.73 Final  . GFR calc Af Amer 12/15/2017 95  >59 mL/min/1.73 Final  . BUN/Creatinine Ratio 12/15/2017 18  9 - 23 Final  . Sodium 12/15/2017 141  134 - 144 mmol/L Final  . Potassium 12/15/2017 4.5  3.5 - 5.2 mmol/L Final  . Chloride 12/15/2017 103  96 - 106 mmol/L Final  . CO2 12/15/2017 21  20 - 29 mmol/L Final  . Calcium 12/15/2017 9.6  8.7 - 10.2 mg/dL Final  . Vitamin B-12 12/15/2017 422  232 - 1,245 pg/mL Final     X-Rays:Dg Chest 2 View  Result Date: 01/17/2018 CLINICAL DATA:  Preoperative clearance knee replacement EXAM: CHEST - 2 VIEW COMPARISON:  None. FINDINGS: Heart size and vascularity normal. Right cardiophrenic mass anteriorly most  likely epicardial fat. No prior cross-sectional imaging of this area. Lungs are clear without infiltrate or effusion. No skeletal abnormality. Cervical ACDF IMPRESSION: Right cardiophrenic angle mass most likely epicardial fat. This cannot be confirmed with prior studies. No acute abnormality. Electronically Signed   By: Franchot Gallo M.D.   On: 01/17/2018 14:33   Dg Femur Port 1v Left  Result Date: 01/26/2018 CLINICAL DATA:  Status post left total knee joint replacement. Single AP view. EXAM: LEFT FEMUR PORTABLE 1 VIEW COMPARISON:  None in PACs FINDINGS: The patient has undergone left total knee joint prosthesis placement. Radiographic positioning of the prosthetic components is good. The interface of the native bone appears normal. There is a surgical drain line present along the lateral aspect of the joint. IMPRESSION: No immediate postprocedure complication following left total knee joint prosthesis placement. A single AP view only is interpreted. Electronically Signed   By: David  Martinique M.D.   On: 01/26/2018 09:58    EKG: Orders placed or performed during the hospital encounter of 01/17/18  . EKG  . EKG     Hospital Course: MARSELLA SUMAN is a 57 y.o. who was admitted to [facility]. They were brought to the operating room on 01/26/2018 and underwent Procedure(s): LEFT TOTAL KNEE ARTHROPLASTY.  Patient tolerated the procedure well and was later transferred to the recovery room and then to the orthopaedic floor for postoperative care.  They were given PO and IV analgesics for pain control following their surgery.  They were given 24 hours of postoperative antibiotics of  Anti-infectives (From admission, onward)   Start     Dose/Rate Route Frequency Ordered Stop   01/26/18 1800  vancomycin (VANCOCIN) IVPB 1000 mg/200 mL premix     1,000 mg 200 mL/hr over 60 Minutes Intravenous Every 12 hours 01/26/18 1115 01/26/18 1818   01/26/18 0816  polymyxin B 500,000 Units, bacitracin 50,000  Units in sodium chloride 0.9 % 500 mL irrigation  Status:  Discontinued       As needed 01/26/18 0817 01/26/18 0931   01/26/18 0645  ceFAZolin (ANCEF) 2-4 GM/100ML-% IVPB  Status:  Discontinued    Note to Pharmacy:  Harle Stanford   : cabinet override  01/26/18 0645 01/26/18 0653   01/26/18 0600  vancomycin (VANCOCIN) 1,500 mg in sodium chloride 0.9 % 500 mL IVPB     1,500 mg 250 mL/hr over 120 Minutes Intravenous On call to O.R. 01/25/18 1214 01/26/18 0900     and started on DVT prophylaxis in the form of Xarelto.   PT and OT were ordered for total joint protocol.  Discharge planning consulted to help with postop disposition and equipment needs.  Patient had a fair night on the evening of surgery.  They started to get up OOB with therapy on day one. Hemovac drain was pulled without difficulty. She had some issues post op day one with itching and flushing secondary to medications. She improved with adjustment of meds. Continued to work with therapy into day two.  Dressing was changed on day two and the incision was clean and dry.  The patient had progressed with therapy and meeting their goals.  Incision was healing well.  Patient was seen in rounds and was ready to go home.   Diet: Cardiac diet Activity:WBAT Follow-up:in 2 weeks Disposition - Home Discharged Condition: stable   Discharge Instructions    Call MD / Call 911   Complete by:  As directed    If you experience chest pain or shortness of breath, CALL 911 and be transported to the hospital emergency room.  If you develope a fever above 101 F, pus (white drainage) or increased drainage or redness at the wound, or calf pain, call your surgeon's office.   Constipation Prevention   Complete by:  As directed    Drink plenty of fluids.  Prune juice may be helpful.  You may use a stool softener, such as Colace (over the counter) 100 mg twice a day.  Use MiraLax (over the counter) for constipation as needed.   Diet - low sodium heart  healthy   Complete by:  As directed    Discharge instructions   Complete by:  As directed    INSTRUCTIONS AFTER JOINT REPLACEMENT   Remove items at home which could result in a fall. This includes throw rugs or furniture in walking pathways ICE to the affected joint every three hours while awake for 30 minutes at a time, for at least the first 3-5 days, and then as needed for pain and swelling.  Continue to use ice for pain and swelling. You may notice swelling that will progress down to the foot and ankle.  This is normal after surgery.  Elevate your leg when you are not up walking on it.   Continue to use the breathing machine you got in the hospital (incentive spirometer) which will help keep your temperature down.  It is common for your temperature to cycle up and down following surgery, especially at night when you are not up moving around and exerting yourself.  The breathing machine keeps your lungs expanded and your temperature down.    DIET:  As you were doing prior to hospitalization, we recommend a well-balanced diet.  DRESSING / WOUND CARE / SHOWERING  You may change your dressing every day with sterile gauze.  Please use good hand washing techniques before changing the dressing.  Do not use any lotions or creams on the incision until instructed by your surgeon.  ACTIVITY  Increase activity slowly as tolerated, but follow the weight bearing instructions below.   No driving for 6 weeks or until further direction given by your physician.  You cannot drive while taking narcotics.  No lifting or carrying greater than 10 lbs. until further directed by your surgeon. Avoid periods of inactivity such as sitting longer than an hour when not asleep. This helps prevent blood clots.  You may return to work once you are authorized by your doctor.     WEIGHT BEARING   Weight bearing as tolerated with assist device (walker, cane, etc) as directed, use it as long as suggested by your surgeon  or therapist, typically at least 4-6 weeks.   EXERCISES  Results after joint replacement surgery are often greatly improved when you follow the exercise, range of motion and muscle strengthening exercises prescribed by your doctor. Safety measures are also important to protect the joint from further injury. Any time any of these exercises cause you to have increased pain or swelling, decrease what you are doing until you are comfortable again and then slowly increase them. If you have problems or questions, call your caregiver or physical therapist for advice.   Rehabilitation is important following a joint replacement. After just a few days of immobilization, the muscles of the leg can become weakened and shrink (atrophy).  These exercises are designed to build up the tone and strength of the thigh and leg muscles and to improve motion. Often times heat used for twenty to thirty minutes before working out will loosen up your tissues and help with improving the range of motion but do not use heat for the first two weeks following surgery (sometimes heat can increase post-operative swelling).   These exercises can be done on a training (exercise) mat, on the floor, on a table or on a bed. Use whatever works the best and is most comfortable for you.    Use music or television while you are exercising so that the exercises are a pleasant break in your day. This will make your life better with the exercises acting as a break in your routine that you can look forward to.   Perform all exercises about fifteen times, three times per day or as directed.  You should exercise both the operative leg and the other leg as well.  Exercises include:   Quad Sets - Tighten up the muscle on the front of the thigh (Quad) and hold for 5-10 seconds.   Straight Leg Raises - With your knee straight (if you were given a brace, keep it on), lift the leg to 60 degrees, hold for 3 seconds, and slowly lower the leg.  Perform this  exercise against resistance later as your leg gets stronger.  Leg Slides: Lying on your back, slowly slide your foot toward your buttocks, bending your knee up off the floor (only go as far as is comfortable). Then slowly slide your foot back down until your leg is flat on the floor again.  Angel Wings: Lying on your back spread your legs to the side as far apart as you can without causing discomfort.  Hamstring Strength:  Lying on your back, push your heel against the floor with your leg straight by tightening up the muscles of your buttocks.  Repeat, but this time bend your knee to a comfortable angle, and push your heel against the floor.  You may put a pillow under the heel to make it more comfortable if necessary.   A rehabilitation program following joint replacement surgery can speed recovery and prevent re-injury in the future due to weakened muscles. Contact your doctor or a physical therapist for more information on knee rehabilitation.  CONSTIPATION  Constipation is defined medically as fewer than three stools per week and severe constipation as less than one stool per week.  Even if you have a regular bowel pattern at home, your normal regimen is likely to be disrupted due to multiple reasons following surgery.  Combination of anesthesia, postoperative narcotics, change in appetite and fluid intake all can affect your bowels.   YOU MUST use at least one of the following options; they are listed in order of increasing strength to get the job done.  They are all available over the counter, and you may need to use some, POSSIBLY even all of these options:    Drink plenty of fluids (prune juice may be helpful) and high fiber foods Colace 100 mg by mouth twice a day  Senokot for constipation as directed and as needed Dulcolax (bisacodyl), take with full glass of water  Miralax (polyethylene glycol) once or twice a day as needed.  If you have tried all these things and are unable to have a  bowel movement in the first 3-4 days after surgery call either your surgeon or your primary doctor.    If you experience loose stools or diarrhea, hold the medications until you stool forms back up.  If your symptoms do not get better within 1 week or if they get worse, check with your doctor.  If you experience "the worst abdominal pain ever" or develop nausea or vomiting, please contact the office immediately for further recommendations for treatment.   ITCHING:  If you experience itching with your medications, try taking only a single pain pill, or even half a pain pill at a time.  You can also use Benadryl over the counter for itching or also to help with sleep.   TED HOSE STOCKINGS:  Use stockings on both legs until for at least 2 weeks or as directed by physician office. They may be removed at night for sleeping.  MEDICATIONS:  See your medication summary on the "After Visit Summary" that nursing will review with you.  You may have some home medications which will be placed on hold until you complete the course of blood thinner medication.  It is important for you to complete the blood thinner medication as prescribed.  PRECAUTIONS:  If you experience chest pain or shortness of breath - call 911 immediately for transfer to the hospital emergency department.   If you develop a fever greater that 101 F, purulent drainage from wound, increased redness or drainage from wound, foul odor from the wound/dressing, or calf pain - CONTACT YOUR SURGEON.                                                   FOLLOW-UP APPOINTMENTS:  If you do not already have a post-op appointment, please call the office for an appointment to be seen by your surgeon.  Guidelines for how soon to be seen are listed in your "After Visit Summary", but are typically between 1-4 weeks after surgery.   MAKE SURE YOU:  Understand these instructions.  Get help right away if you are not doing well or get worse.    Thank you for  letting us be a part of your medical care team.  It is a privilege we respect greatly.  We hope these instructions will help you stay on track  for a fast and full recovery!   Increase activity slowly as tolerated   Complete by:  As directed      Allergies as of 01/28/2018      Reactions   Influenza Vaccines Anaphylaxis, Other (See Comments)   Per patient   Penicillins Anaphylaxis, Other (See Comments)   PATIENT HAS HAD A PCN REACTION WITH IMMEDIATE RASH, FACIAL/TONGUE/THROAT SWELLING, SOB, OR LIGHTHEADEDNESS WITH HYPOTENSION:  #  #  #  YES  #  #  #   Has patient had a PCN reaction causing severe rash involving mucus membranes or skin necrosis: No PATIENT HAS HAD A PCN REACTION THAT REQUIRED HOSPITALIZATION:  #  #  #  YES  #  #  #  Has patient had a PCN reaction occurring within the last 10 years: No   Cortisone Other (See Comments)   Turned red and ran a low grade fever for 3 days      Medication List    STOP taking these medications   doxycycline 50 MG capsule Commonly known as:  VIBRAMYCIN   ibuprofen 200 MG tablet Commonly known as:  ADVIL,MOTRIN     TAKE these medications   acetaminophen 500 MG tablet Commonly known as:  TYLENOL Take 1,000 mg by mouth daily as needed for moderate pain or headache.   aspirin EC 325 MG tablet Take 1 tablet (325 mg total) by mouth 2 (two) times daily.   cholecalciferol 1000 units tablet Commonly known as:  VITAMIN D Take 1,000 Units by mouth daily.   HYDROmorphone 2 MG tablet Commonly known as:  DILAUDID Take 1 tablet (2 mg total) by mouth every 4 (four) hours as needed for moderate pain.   methocarbamol 500 MG tablet Commonly known as:  ROBAXIN Take 1 tablet (500 mg total) by mouth every 6 (six) hours as needed for muscle spasms.   multivitamin with minerals Tabs tablet Take 1 tablet by mouth daily.   vitamin B-12 1000 MCG tablet Commonly known as:  CYANOCOBALAMIN Take 1,000 mcg by mouth daily.      Follow-up Information     Latanya Maudlin, MD. Schedule an appointment as soon as possible for a visit in 2 week(s).   Specialty:  Orthopedic Surgery Contact information: [address] [place] [address] [phone]        [facility] Follow up.   Specialty:  Home Health Services Why:  physical therapy Contact information: [address] [place] [address] [facility] Follow up.   Why:  walker and [facility] Contact information: [place] [address] [phone]           Signed: Ardeen Jourdain, PA-C Orthopaedic Surgery 01/31/2018, 8:30 AM

## 2018-02-01 ENCOUNTER — Ambulatory Visit: Admitting: Neurology

## 2018-02-11 ENCOUNTER — Encounter: Payer: Self-pay | Admitting: Pediatrics

## 2018-02-11 ENCOUNTER — Other Ambulatory Visit: Payer: Self-pay | Admitting: *Deleted

## 2018-02-11 ENCOUNTER — Encounter: Payer: Self-pay | Admitting: Neurology

## 2018-02-14 ENCOUNTER — Ambulatory Visit: Admitting: Physical Therapy

## 2018-04-25 ENCOUNTER — Encounter: Payer: Self-pay | Admitting: Physician Assistant

## 2018-04-25 ENCOUNTER — Ambulatory Visit (INDEPENDENT_AMBULATORY_CARE_PROVIDER_SITE_OTHER): Admitting: Physician Assistant

## 2018-04-25 VITALS — BP 113/69 | HR 72 | Temp 98.1°F | Ht 65.0 in | Wt 231.8 lb

## 2018-04-25 DIAGNOSIS — J019 Acute sinusitis, unspecified: Secondary | ICD-10-CM

## 2018-04-25 DIAGNOSIS — R05 Cough: Secondary | ICD-10-CM | POA: Diagnosis not present

## 2018-04-25 DIAGNOSIS — R059 Cough, unspecified: Secondary | ICD-10-CM

## 2018-04-25 MED ORDER — AZITHROMYCIN 250 MG PO TABS: ORAL_TABLET | ORAL | 0 refills | Status: DC

## 2018-04-25 NOTE — Progress Notes (Signed)
BP 113/69   Pulse 72   Temp 98.1 F (36.7 C) (Oral)   Ht 5\' 5"  (1.651 m)   Wt 231 lb 12.8 oz (105.1 kg)   BMI 38.57 kg/m    Subjective:    Patient ID: Miranda Perkins, female    DOB: 22-Jun-1961, 57 y.o.   MRN: [id number]  HPI: Miranda Perkins is a 57 y.o. female presenting on 04/25/2018 for Ear Pain; chest congestion; and Sore Throat  This patient has had more than 5 days of sinus headache and postnasal drainage. There is copious drainage at times. Denies any fever at this time. There has been a history of sinus infections in the past.  No history of sinus surgery. There is cough at night. It has become more prevalent in recent days.   Past Medical History:  Diagnosis Date  . 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 Carpal tunnel syndrome    BILATERAL HANDS  . Depression   . Osteopenia 12/2017   T score -1.2 FRAX 4.9% / 0.3%  . Stroke ([place]) 1993   while on Chloramphenicol, no residuaL; [MEDICATION WAS GIVEN TO TREAT TICK BITE] ; patient states  "i stroked out right next to my doctor , he said i passed out and that was my only symptom" ;    Relevant past medical, surgical, family and social history reviewed and updated as indicated. Interim medical history since our last visit reviewed. Allergies and medications reviewed and updated. DATA REVIEWED: CHART IN EPIC  Family History reviewed for pertinent findings.  Review of Systems  Constitutional: Positive for chills and fatigue. Negative for activity change and appetite change.  HENT: Positive for congestion, postnasal drip, sinus pain and sore throat.   Eyes: Negative.   Respiratory: Positive for cough. Negative for wheezing.   Cardiovascular: Negative.  Negative for chest pain, palpitations and leg swelling.  Gastrointestinal: Negative.   Genitourinary: Negative.   Musculoskeletal: Negative.   Skin: Negative.   Neurological: Positive for headaches.    Allergies as  of 04/25/2018      Reactions   Influenza Vaccines Anaphylaxis, Other (See Comments)   Per patient   Penicillins Anaphylaxis, Other (See Comments)   PATIENT HAS HAD A PCN REACTION WITH IMMEDIATE RASH, FACIAL/TONGUE/THROAT SWELLING, SOB, OR LIGHTHEADEDNESS WITH HYPOTENSION:  #  #  #  YES  #  #  #   Has patient had a PCN reaction causing severe rash involving mucus membranes or skin necrosis: No PATIENT HAS HAD A PCN REACTION THAT REQUIRED HOSPITALIZATION:  #  #  #  YES  #  #  #  Has patient had a PCN reaction occurring within the last 10 years: No   Cortisone Other (See Comments)   Turned red and ran a low grade fever for 3 days      Medication List        Accurate as of 04/25/18 11:55 AM. Always use your most recent med list.          acetaminophen 500 MG tablet Commonly known as:  TYLENOL Take 1,000 mg by mouth daily as needed for moderate pain or headache.   azithromycin 250 MG tablet Commonly known as:  ZITHROMAX Z-PAK Take as directed   diphenhydrAMINE 25 MG tablet Commonly known as:  BENADRYL Take 25 mg by mouth every 6 (six) hours as needed.   guaiFENesin 600 MG 12 hr tablet Commonly  known as:  MUCINEX Take by mouth 2 (two) times daily.   ibuprofen 200 MG tablet Commonly known as:  ADVIL,MOTRIN Take 200 mg by mouth every 6 (six) hours as needed.          Objective:    BP 113/69   Pulse 72   Temp 98.1 F (36.7 C) (Oral)   Ht 5\' 5"  (1.651 m)   Wt 231 lb 12.8 oz (105.1 kg)   BMI 38.57 kg/m   Allergies  Allergen Reactions  . Influenza Vaccines Anaphylaxis and Other (See Comments)    Per patient  . Penicillins Anaphylaxis and Other (See Comments)    PATIENT HAS HAD A PCN REACTION WITH IMMEDIATE RASH, FACIAL/TONGUE/THROAT SWELLING, SOB, OR LIGHTHEADEDNESS WITH HYPOTENSION:  #  #  #  YES  #  #  #   Has patient had a PCN reaction causing severe rash involving mucus membranes or skin necrosis: No PATIENT HAS HAD A PCN REACTION THAT REQUIRED HOSPITALIZATION:  #   #  #  YES  #  #  #  Has patient had a PCN reaction occurring within the last 10 years: No   . Cortisone Other (See Comments)    Turned red and ran a low grade fever for 3 days    Wt Readings from Last 3 Encounters:  04/25/18 231 lb 12.8 oz (105.1 kg)  01/26/18 231 lb (104.8 kg)  01/17/18 231 lb (104.8 kg)    Physical Exam  Constitutional: She is oriented to person, place, and time. She appears well-developed and well-nourished.  HENT:  Head: Normocephalic and atraumatic.  Right Ear: Tympanic membrane and external ear normal. No middle ear effusion.  Left Ear: Tympanic membrane and external ear normal.  No middle ear effusion.  Nose: Mucosal edema and rhinorrhea present. Right sinus exhibits no maxillary sinus tenderness. Left sinus exhibits no maxillary sinus tenderness.  Mouth/Throat: Uvula is midline. Posterior oropharyngeal erythema present.  Eyes: Pupils are equal, round, and reactive to light. Conjunctivae and EOM are normal. Right eye exhibits no discharge. Left eye exhibits no discharge.  Neck: Normal range of motion.  Cardiovascular: Normal rate, regular rhythm and normal heart sounds.  Pulmonary/Chest: Effort normal and breath sounds normal. No respiratory distress. She has no wheezes.  Abdominal: Soft.  Lymphadenopathy:    She has no cervical adenopathy.  Neurological: She is alert and oriented to person, place, and time.  Skin: Skin is warm and dry.  Psychiatric: She has a normal mood and affect.    Results for orders placed or performed during the hospital encounter of 01/26/18  CBC  Result Value Ref Range   WBC 13.2 (H) 4.0 - 10.5 K/uL   RBC 4.40 3.87 - 5.11 MIL/uL   Hemoglobin 12.7 12.0 - 15.0 g/dL   HCT 38.7 36.0 - 46.0 %   MCV 88.0 78.0 - 100.0 fL   MCH 28.9 26.0 - 34.0 pg   MCHC 32.8 30.0 - 36.0 g/dL   RDW 14.0 11.5 - 15.5 %   Platelets 255 150 - 400 K/uL  Basic metabolic panel  Result Value Ref Range   Sodium 141 135 - 145 mmol/L   Potassium 4.1 3.5 -  5.1 mmol/L   Chloride 107 101 - 111 mmol/L   CO2 25 22 - 32 mmol/L   Glucose, Bld 154 (H) 65 - 99 mg/dL   BUN 11 6 - 20 mg/dL   Creatinine, Ser 0.67 0.44 - 1.00 mg/dL   Calcium 9.0 8.9 - 10.3  mg/dL   GFR calc non Af Amer >60 >60 mL/min   GFR calc Af Amer >60 >60 mL/min   Anion gap 9 5 - 15  CBC  Result Value Ref Range   WBC 11.6 (H) 4.0 - 10.5 K/uL   RBC 4.26 3.87 - 5.11 MIL/uL   Hemoglobin 12.2 12.0 - 15.0 g/dL   HCT 37.6 36.0 - 46.0 %   MCV 88.3 78.0 - 100.0 fL   MCH 28.6 26.0 - 34.0 pg   MCHC 32.4 30.0 - 36.0 g/dL   RDW 14.4 11.5 - 15.5 %   Platelets 211 150 - 400 K/uL  Basic metabolic panel  Result Value Ref Range   Sodium 142 135 - 145 mmol/L   Potassium 4.0 3.5 - 5.1 mmol/L   Chloride 108 101 - 111 mmol/L   CO2 27 22 - 32 mmol/L   Glucose, Bld 118 (H) 65 - 99 mg/dL   BUN 11 6 - 20 mg/dL   Creatinine, Ser 0.58 0.44 - 1.00 mg/dL   Calcium 8.7 (L) 8.9 - 10.3 mg/dL   GFR calc non Af Amer >60 >60 mL/min   GFR calc Af Amer >60 >60 mL/min   Anion gap 7 5 - 15      Assessment & Plan:   1. Acute sinusitis, recurrence not specified, unspecified location FLONASE at home - guaiFENesin (MUCINEX) 600 MG 12 hr tablet; Take by mouth 2 (two) times daily. - diphenhydrAMINE (BENADRYL) 25 MG tablet; Take 25 mg by mouth every 6 (six) hours as needed. - ibuprofen (ADVIL,MOTRIN) 200 MG tablet; Take 200 mg by mouth every 6 (six) hours as needed. - azithromycin (ZITHROMAX Z-PAK) 250 MG tablet; Take as directed  Dispense: 6 each; Refill: 0  2. Cough - guaiFENesin (MUCINEX) 600 MG 12 hr tablet; Take by mouth 2 (two) times daily.   Continue all other maintenance medications as listed above.  Follow up plan: No follow-ups on file.  Educational handout given for [place] PA-C [facility] [address]  [place], [place] [address] [phone]   04/25/2018, 11:55 AM

## 2018-05-16 ENCOUNTER — Other Ambulatory Visit: Payer: Self-pay | Admitting: "Endocrinology

## 2018-05-16 DIAGNOSIS — E041 Nontoxic single thyroid nodule: Secondary | ICD-10-CM

## 2018-05-17 ENCOUNTER — Ambulatory Visit: Admitting: "Endocrinology

## 2018-06-21 ENCOUNTER — Other Ambulatory Visit

## 2018-06-21 ENCOUNTER — Other Ambulatory Visit: Payer: Self-pay | Admitting: "Endocrinology

## 2018-06-21 DIAGNOSIS — E039 Hypothyroidism, unspecified: Secondary | ICD-10-CM

## 2018-06-22 ENCOUNTER — Ambulatory Visit (HOSPITAL_COMMUNITY)
Admission: RE | Admit: 2018-06-22 | Discharge: 2018-06-22 | Disposition: A | Discharge status: Home / Self Care | Source: Ambulatory Visit | Attending: "Endocrinology | Admitting: "Endocrinology

## 2018-06-22 DIAGNOSIS — E042 Nontoxic multinodular goiter: Secondary | ICD-10-CM | POA: Diagnosis not present

## 2018-06-22 DIAGNOSIS — E041 Nontoxic single thyroid nodule: Secondary | ICD-10-CM | POA: Diagnosis present

## 2018-06-22 LAB — T4, FREE: FREE T4: 1.15 ng/dL (ref 0.82–1.77)

## 2018-06-22 LAB — TSH: TSH: 1.95 u[IU]/mL (ref 0.450–4.500)

## 2018-06-23 IMAGING — DX DG FEMUR 1V PORT*L*
1 series · 1 of 1 positions shown · non-contrast
Comparison: None in PACs

CLINICAL DATA: Status post left total knee joint replacement.
Single AP view.

EXAM:
LEFT FEMUR PORTABLE 1 VIEW

[femur ap]
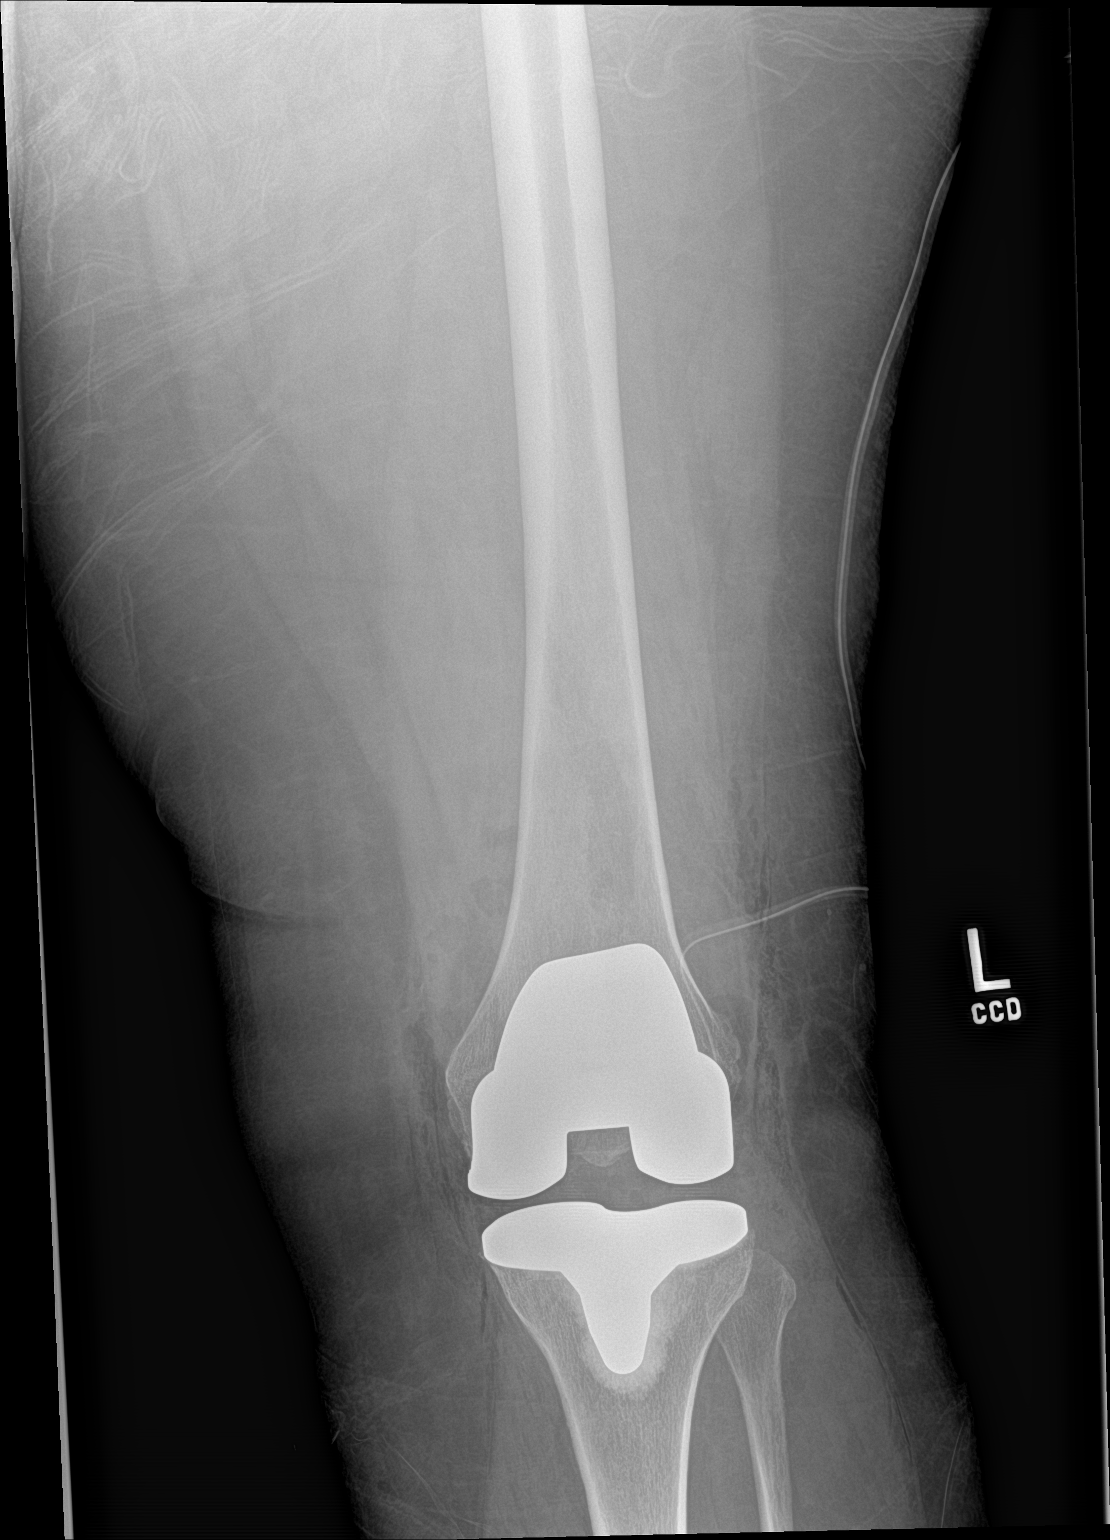

[1 of 1 positions shown; findings below may reference images not displayed]

FINDINGS: The patient has undergone left total knee joint prosthesis
placement. Radiographic positioning of the prosthetic components is
good. The interface of the native bone appears normal. There is a
surgical drain line present along the lateral aspect of the joint.
IMPRESSION: No immediate postprocedure complication following left total knee
joint prosthesis placement. A single AP view only is interpreted.

## 2018-07-21 ENCOUNTER — Encounter: Payer: Self-pay | Admitting: Pediatrics

## 2018-07-21 ENCOUNTER — Ambulatory Visit (INDEPENDENT_AMBULATORY_CARE_PROVIDER_SITE_OTHER): Admitting: Pediatrics

## 2018-07-21 VITALS — BP 124/84 | HR 69 | Temp 97.7°F | Ht 65.0 in | Wt 237.6 lb

## 2018-07-21 DIAGNOSIS — G8929 Other chronic pain: Secondary | ICD-10-CM

## 2018-07-21 DIAGNOSIS — R229 Localized swelling, mass and lump, unspecified: Secondary | ICD-10-CM | POA: Diagnosis not present

## 2018-07-21 DIAGNOSIS — Z1239 Encounter for other screening for malignant neoplasm of breast: Secondary | ICD-10-CM

## 2018-07-21 DIAGNOSIS — L989 Disorder of the skin and subcutaneous tissue, unspecified: Secondary | ICD-10-CM | POA: Diagnosis not present

## 2018-07-21 DIAGNOSIS — M25561 Pain in right knee: Secondary | ICD-10-CM

## 2018-07-21 DIAGNOSIS — M25562 Pain in left knee: Secondary | ICD-10-CM

## 2018-07-21 DIAGNOSIS — L719 Rosacea, unspecified: Secondary | ICD-10-CM

## 2018-07-21 DIAGNOSIS — Z1231 Encounter for screening mammogram for malignant neoplasm of breast: Secondary | ICD-10-CM | POA: Diagnosis not present

## 2018-07-21 DIAGNOSIS — IMO0002 Reserved for concepts with insufficient information to code with codable children: Secondary | ICD-10-CM

## 2018-07-21 NOTE — Patient Instructions (Signed)
Check blood pressures at home, bring numbers to next office visit  OK to take 600mg  ibuprofne 2-3 times a day as needed for knee pain

## 2018-07-21 NOTE — Progress Notes (Signed)
  Subjective:   Patient ID: Miranda Perkins, female    DOB: Aug 03, 1961, 57 y.o.   MRN: [id number] CC: Medical Management of Chronic Issues  HPI: Miranda Perkins is a 57 y.o. female   Bilateral knees continue to bother her.  She had a left knee replaced about 6 months ago.  She takes ibuprofen rarely, only when pain is unbearable.  Trying to break to help.  She was told she has an epicardial mass that may need further investigation while hospitalized needed for neck surgery or surgery.  I cannot find any documentation or imaging studies that describes in the similar.  Lump R breast 10 oclock Upper lateral quadrant present 1990s.  Relevant past medical, surgical, family and social history reviewed. Allergies and medications reviewed and updated. Social History   Tobacco Use  Smoking Status Former Smoker  . Packs/day: 0.50  . Types: Cigarettes  . Last attempt to quit: 11/26/2009  . Years since quitting: 8.6  Smokeless Tobacco Never Used   ROS: Per HPI   Objective:    BP 124/84   Pulse 69   Temp 97.7 F (36.5 C) (Oral)   Ht 5\' 5"  (1.651 m)   Wt 237 lb 9.6 oz (107.8 kg)   BMI 39.54 kg/m   Wt Readings from Last 3 Encounters:  07/21/18 237 lb 9.6 oz (107.8 kg)  04/25/18 231 lb 12.8 oz (105.1 kg)  01/26/18 231 lb (104.8 kg)    Gen: NAD, alert, cooperative with exam, NCAT EYES: EOMI, no conjunctival injection, or no icterus ENT: Clear effusion right TM, bilaterally TMs pearly gray, OP without erythema LYMPH: no cervical LAD CV: NRRR, normal S1/S2, no murmur, distal pulses 2+ b/l Resp: CTABL, no wheezes, normal WOB Abd: +BS, soft, NTND. no guarding or organomegaly Ext: No edema, warm Neuro: Alert and oriented, strength equal b/l UE and LE, coordination grossly normal MSK: normal muscle bulk Skin: Approximately 4 mm slightly bluish nodule left temple  Assessment & Plan:  Miranda Perkins was seen today for medical management of chronic issues.  Diagnoses and all orders for  this visit:  Rosacea Takes doxycycline as needed.  Breast cancer screening -     MM Digital Screening; Future  Mass Was told she has an epicardial mass that may need further investigation while hospitalized either for neck surgery or surgery.  Not clear when or how this was seen.  I cannot find any documentation or imaging studies that describes similar.  Will get echo, none prior. -     ECHOCARDIOGRAM COMPLETE; Future  Skin lesion -     Ambulatory referral to Dermatology  Knee pain Ibuprofen 600 mg 2-3 times a day as needed.  If not improving will need to get back into see orthopedics.  Elevated blood pressures at home Continue to follow, let me know if regularly greater than 599 systolic or greater than 90 diastolic  Follow up plan: Return in about 3 months (around 10/20/2018). Assunta Found, MD [place]

## 2018-07-27 ENCOUNTER — Ambulatory Visit (HOSPITAL_COMMUNITY)
Admission: RE | Admit: 2018-07-27 | Discharge: 2018-07-27 | Disposition: A | Discharge status: Home / Self Care | Source: Ambulatory Visit | Attending: Pediatrics | Admitting: Pediatrics

## 2018-07-27 DIAGNOSIS — I515 Myocardial degeneration: Secondary | ICD-10-CM | POA: Diagnosis not present

## 2018-07-27 DIAGNOSIS — Z87891 Personal history of nicotine dependence: Secondary | ICD-10-CM | POA: Insufficient documentation

## 2018-07-27 DIAGNOSIS — I517 Cardiomegaly: Secondary | ICD-10-CM | POA: Insufficient documentation

## 2018-07-27 DIAGNOSIS — E042 Nontoxic multinodular goiter: Secondary | ICD-10-CM | POA: Insufficient documentation

## 2018-07-27 DIAGNOSIS — R229 Localized swelling, mass and lump, unspecified: Secondary | ICD-10-CM | POA: Diagnosis not present

## 2018-07-27 DIAGNOSIS — IMO0002 Reserved for concepts with insufficient information to code with codable children: Secondary | ICD-10-CM

## 2018-07-27 DIAGNOSIS — Z96659 Presence of unspecified artificial knee joint: Secondary | ICD-10-CM | POA: Diagnosis not present

## 2018-07-27 NOTE — Progress Notes (Signed)
*  PRELIMINARY RESULTS* Echocardiogram 2D Echocardiogram has been performed.  Miranda Perkins 07/27/2018, 4:04 PM

## 2018-08-09 ENCOUNTER — Ambulatory Visit (INDEPENDENT_AMBULATORY_CARE_PROVIDER_SITE_OTHER): Admitting: "Endocrinology

## 2018-08-09 ENCOUNTER — Encounter: Payer: Self-pay | Admitting: "Endocrinology

## 2018-08-09 VITALS — BP 135/81 | HR 64 | Ht 65.0 in | Wt 240.0 lb

## 2018-08-09 DIAGNOSIS — E042 Nontoxic multinodular goiter: Secondary | ICD-10-CM

## 2018-08-09 NOTE — Progress Notes (Signed)
Endocrinology follow-up note   Subjective:    Patient ID: Miranda Perkins, female    DOB: [birth year], PCP Eustaquio Maize, MD   Past Medical History:  Diagnosis Date  . Cancer ([facility]) 2016   basal cell, forehead  . Cancer ([facility]) 2015   on roof of mouth  . Carpal tunnel syndrome    BILATERAL HANDS  . Depression   . Osteopenia 12/2017   T score -1.2 FRAX 4.9% / 0.3%  . Stroke ([facility]) 1993   while on Chloramphenicol, no residuaL; [MEDICATION WAS GIVEN TO TREAT TICK BITE] ; patient states  "i stroked out right next to my doctor , he said i passed out and that was my only symptom" ;    Past Surgical History:  Procedure Laterality Date  . ANTERIOR CERVICAL DECOMP/DISCECTOMY FUSION N/A 09/27/2017   Procedure: ANTERIOR CERVICAL DECOMPRESSION/DISCECTOMY FUSION CERVICAL FOUR-FIVE ,CERVICAL FIVE-SIX,CERVICAL SIX-SEVEN;  Surgeon: Kary Kos, MD;  Location: [facility];  Service: Neurosurgery;  Laterality: N/A;  . CARPAL TUNNEL RELEASE Left 09/27/2017   Procedure: CARPAL TUNNEL RELEASE;  Surgeon: Kary Kos, MD;  Location: [facility];  Service: Neurosurgery;  Laterality: Left;  . FOOT SURGERY     bone removed from pinky toe  . HERNIA REPAIR    . KNEE SURGERY Left 06/21/2017   dr Rushie Nyhan; meniscus tear   . LEG SURGERY  1972  . MOUTH SURGERY    . skin cancer removal    . TONSILLECTOMY     1979?  . TOTAL KNEE ARTHROPLASTY Left 01/26/2018   Procedure: LEFT TOTAL KNEE ARTHROPLASTY;  Surgeon: Latanya Maudlin, MD;  Location: [facility];  Service: Orthopedics;  Laterality: Left;  . TUBAL LIGATION     Social History   Socioeconomic History  . Marital status: Married    Spouse name: Not on file  . Number of children: 6  . Years of education: Not on file  . Highest education level: Not on file  Occupational History  . 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 Financial resource strain: Not on file  . Food insecurity:    Worry: Not on file    Inability: Not  on file  . Transportation needs:    Medical: Not on file    Non-medical: Not on file  Tobacco Use  . Smoking status: Former Smoker    Packs/day: 0.50    Types: Cigarettes    Last attempt to quit: 11/26/2009    Years since quitting: 8.7  . Smokeless tobacco: Never Used  Substance and Sexual Activity  . Alcohol use: Yes    Comment: maybe 2 drinks a year  . Drug use: No  . Sexual activity: Not Currently    Comment: 1st intercourse 50 yo-5 partners  Lifestyle  . Physical activity:    Days per week: Not on file    Minutes per session: Not on file  . Stress: Not on file  Relationships  . Social connections:    Talks on phone: Not on file    Gets together: Not on file    Attends religious service: Not on file    Active member of club or organization: Not on file    Attends meetings of clubs or organizations: Not on file    Relationship status: Not on file  Other Topics Concern  . Not on file  Social History Narrative   Lives with husband. Raising 2 grandchildren of deceased daughter.  Lives in one story ranch house.    Outpatient Encounter Medications as of 08/09/2018  Medication Sig  . acetaminophen (TYLENOL) 500 MG tablet Take 1,000 mg by mouth daily as needed for moderate pain or headache.  . diphenhydrAMINE (BENADRYL) 25 MG tablet Take 25 mg by mouth every 6 (six) hours as needed.  [name] ibuprofen (ADVIL,MOTRIN) 200 MG tablet Take 200 mg by mouth every 6 (six) hours as needed.   No facility-administered encounter medications on file as of 08/09/2018.    ALLERGIES: Allergies  Allergen Reactions  . Influenza Vaccines Anaphylaxis and Other (See Comments)    Per patient  . Penicillins Anaphylaxis and Other (See Comments)    PATIENT HAS HAD A PCN REACTION WITH IMMEDIATE RASH, FACIAL/TONGUE/THROAT SWELLING, SOB, OR LIGHTHEADEDNESS WITH HYPOTENSION:  #  #  #  YES  #  #  #   Has patient had a PCN reaction causing severe rash involving mucus membranes or skin necrosis:  No PATIENT HAS HAD A PCN REACTION THAT REQUIRED HOSPITALIZATION:  #  #  #  YES  #  #  #  Has patient had a PCN reaction occurring within the last 10 years: No   . Cortisone Other (See Comments)    Turned red and ran a low grade fever for 3 days   VACCINATION STATUS:  There is no immunization history on file for this patient.  HPI 57 yr old female with above medical hx. She is here to follow-up for her history of multinodular goiter with repeat thyroid function tests. -Her repeat thyroid ultrasound is largely unchanged from her prior studies- Showing stable 1.7 cm nodule on the right lobe which was previously biopsied and stable 1.1 cm nodule on the right lobe of the thyroid, as well as new 0.4 cm unremarkable  nodule in the left lobe of thyroid.    -Ultrasound reports from 2015 and 16 have been  scanned in her records.  -Fine-needle aspiration from November 2015 in [place]  showed benign nodular goiter bethesda system category  II with no cytologic evidence of malignancy.   She is not on thyroid hormones nor is she on antithyroid meds. She denies exposure to neck radiation. She denies dysphagia, SOB, voice change. She denies family hx of thyroid cancer. She has no new complaints today.  Review of Systems Constitutional:  + steady weight, + fatigue, no subjective hyperthermia/hypothermia Eyes: no blurry vision, no xerophthalmia ENT: no sore throat, no nodules palpated in throat, no dysphagia/odynophagia, no hoarseness Cardiovascular: no pain, no palpitations.   Musculoskeletal: no muscle/joint aches Skin: no rashes Neurological: no tremors/numbness/tingling/dizziness Psychiatric: no depression/anxiety  Objective:    BP 135/81   Pulse 64   Ht 5\' 5"  (1.651 m)   Wt 240 lb (108.9 kg)   BMI 39.94 kg/m   Wt Readings from Last 3 Encounters:  08/09/18 240 lb (108.9 kg)  07/21/18 237 lb 9.6 oz (107.8 kg)  04/25/18 231 lb 12.8 oz (105.1 kg)    Physical  Exam Constitutional: + Obese, not in acute distress, normal state of mind. Eyes: PERRLA, EOMI, no exophthalmos ENT: moist mucous membranes, + palpable mild thyromegaly, no cervical lymphadenopathy Musculoskeletal: no deformities, strength intact in all 4 Skin: moist, warm, no rashes Neurological: no tremor with outstretched hands, DTR normal in all 4  Labs from 11/27/2015 showed TSH and free T4 within normal range.  Thyroid ultrasound from Mar 27, 2015 from [facility] was reviewed . Largest nodule on right lobe 1.7 cms .  Recent Results (from the past 2160 hour(s))  TSH     Status: None   Collection Time: 06/21/18 10:15 AM  Result Value Ref Range   TSH 1.950 0.450 - 4.500 uIU/mL  T4, Free     Status: None   Collection Time: 06/21/18 10:15 AM  Result Value Ref Range   Free T4 1.15 0.82 - 1.77 ng/dL     Her repeat thyroid ultrasound on 03/26/2016 at [facility] radiology showed unchanged sonogram of the thyroid with 1.7 and 1.1 cm nodules.  Fine-needle aspiration on 09/28/2014 on her right lobe nodule   in [facility] which showed abundant normal follicular cells and a few clusters of intact normal follicles, a small component of lymphocytes mixed with isolated Hurthle cells.  June 22, 2018 thyroid ultrasound shows right lobe 5.3 cm, increasing from 5 cm with 2 nodules stable 1.7 cm nodule previously biopsied, stable 1.1 cm nodule not biopsied.  Left lobe measures 4.3 cm, increasing from 4 cm with new 0.4 cm nodule.   Assessment & Plan:   1. Multiple thyroid nodules/Nontoxic MNG  -She has stable euthyroid multinodular goiter, previously biopsied 1.7 cm nodule on the right lobe is a biggest.  Cytology findings were considered benign. -Her thyroid function tests are within normal limits.  She will not require antithyroid interventions nor thyroid hormone replacements. -She will have repeat thyroid function test and thyroid ultrasound  in 1 year. - I advised  patient to maintain close follow up with Eustaquio Maize, MD for primary care needs. Follow up plan: Return in about 1 year (around 08/10/2019) for Thyroid / Neck Ultrasound.  Glade Lloyd, MD Phone: [phone]  Fax: [phone]   08/09/2018, 2:02 PM

## 2018-10-21 ENCOUNTER — Telehealth: Payer: Self-pay | Admitting: *Deleted

## 2018-10-21 NOTE — Telephone Encounter (Signed)
Patient having swelling in leg that had knee surgery.  She has felt short of breath at times.   Advised to call surgeon for advise and if no response should probably go to emergency department for treatment.

## 2018-11-03 ENCOUNTER — Encounter: Payer: Self-pay | Admitting: Physical Therapy

## 2018-11-03 ENCOUNTER — Ambulatory Visit: Attending: Orthopedic Surgery | Admitting: Physical Therapy

## 2018-11-03 DIAGNOSIS — M25662 Stiffness of left knee, not elsewhere classified: Secondary | ICD-10-CM | POA: Diagnosis present

## 2018-11-03 DIAGNOSIS — M25562 Pain in left knee: Secondary | ICD-10-CM | POA: Insufficient documentation

## 2018-11-03 DIAGNOSIS — G8929 Other chronic pain: Secondary | ICD-10-CM | POA: Diagnosis present

## 2018-11-03 NOTE — Addendum Note (Signed)
Addended by: Genny Caulder, [place] W on: 11/03/2018 03:19 PM   Modules accepted: Orders

## 2018-11-03 NOTE — Therapy (Addendum)
[facility] [place], [place], [address] Phone: [phone]   Fax:  [phone]  Physical Therapy Evaluation  Patient Details  Name: Miranda Perkins MRN: [id number] Date of Birth: 06/15/1961 Referring Provider (PT): Latanya Maudlin MD   Encounter Date: 11/03/2018  PT End of Session - 11/03/18 1338    Visit Number  1    Number of Visits  8    Date for PT Re-Evaluation  12/01/18    PT Start Time  0104    PT Stop Time  0151    PT Time Calculation (min)  47 min    Activity Tolerance  Patient tolerated treatment well    Behavior During Therapy  [facility] for tasks assessed/performed       Past Medical History:  Diagnosis Date  . 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 Carpal tunnel syndrome    BILATERAL HANDS  . Depression   . Osteopenia 12/2017   T score -1.2 FRAX 4.9% / 0.3%  . Stroke ([place]) 1993   while on Chloramphenicol, no residuaL; [MEDICATION WAS GIVEN TO TREAT TICK BITE] ; patient states  "i stroked out right next to my doctor , he said i passed out and that was my only symptom" ;     Past Surgical History:  Procedure Laterality Date  . ANTERIOR CERVICAL DECOMP/DISCECTOMY FUSION N/A 09/27/2017   Procedure: ANTERIOR CERVICAL DECOMPRESSION/DISCECTOMY FUSION CERVICAL FOUR-FIVE ,CERVICAL FIVE-SIX,CERVICAL SIX-SEVEN;  Surgeon: Kary Kos, MD;  Location: [place];  Service: Neurosurgery;  Laterality: N/A;  . CARPAL TUNNEL RELEASE Left 09/27/2017   Procedure: CARPAL TUNNEL RELEASE;  Surgeon: Kary Kos, MD;  Location: [place];  Service: Neurosurgery;  Laterality: Left;  . FOOT SURGERY     bone removed from pinky toe  . HERNIA REPAIR    . KNEE SURGERY Left 06/21/2017   dr Rushie Nyhan; meniscus tear   . LEG SURGERY  1972  . MOUTH SURGERY    . skin cancer removal    . TONSILLECTOMY     1979?  . TOTAL KNEE ARTHROPLASTY Left 01/26/2018   Procedure: LEFT TOTAL KNEE ARTHROPLASTY;   Surgeon: Latanya Maudlin, MD;  [facility];  Service: Orthopedics;  Laterality: Left;  . TUBAL LIGATION      There were no vitals filed for this visit.   Subjective Assessment - 11/03/18 1330    Subjective  The patient underwent a left total knee replacement on 01/26/18 but states she was not able to participate in PT.  She reports that since surgery her knee has continued to hurt and swell especially after being up for extended periods of time.  Today her pain is a 2/10 but if she were to walk for a long period of time her pain would easily rise to a 7 to 7+/10.  She states that she is going to a Cardiologist to make sure any of the swelling is related to her heart.    Pertinent History  Cervical fusion, stroke, osteopenia.    How long can you walk comfortably?  Can walk a community distance but painful.    Patient Stated Goals  Walk without pain and swelling.    Currently in Pain?  Yes    Pain Score  2     Pain Location  Knee    Pain Orientation  Left    Pain Descriptors / Indicators  Aching    Pain Type  Surgical pain    Pain Onset  More than a month ago    Pain Frequency  Constant    Aggravating Factors   See above.    Pain Relieving Factors  See above.         [facility] PT Assessment - 11/03/18 0001      Assessment   Medical Diagnosis  Left total knee replacement.    Referring Provider (PT)  Latanya Maudlin MD    Onset Date/Surgical Date  --   01/26/18 (surgery date).     Precautions   Precautions  --   No ultrasound.     Restrictions   Weight Bearing Restrictions  No      Balance Screen   Has the patient fallen in the past 6 months  No    Has the patient had a decrease in activity level because of a fear of falling?   No    Is the patient reluctant to leave their home because of a fear of falling?   No      Home Film/video editor residence      Prior Function   Level of Independence  Independent      Observation/Other Assessments-Edema     Edema  Circumferential      Circumferential Edema   Circumferential - Right  LT 3 cms > RT.      ROM / Strength   AROM / PROM / Strength  AROM;Strength      AROM   Overall AROM Comments  Left knee AROM -5 to 80 degrees.      Strength   Overall Strength Comments  Left hip and knee strength= 4+/5.      Palpation   Palpation comment  Patient c/o palpable pain near the left knee lateral joint line.      Ambulation/Gait   Gait Comments  Essentially normal gait cycle.                Objective measurements completed on examination: See above findings.      [place] Adult PT Treatment/Exercise - 11/03/18 0001      Exercises   Exercises  Knee/Hip      Knee/Hip Exercises: Aerobic   Nustep  Level 3 moving forward times one to increase knee flexion x 8 minutes.      Modalities   Modalities  Health visitor Stimulation Location  Left knee.    Electrical Stimulation Action  IFC    Electrical Stimulation Parameters  1-10 Hz x 15 minutes.    Electrical Stimulation Goals  Edema;Pain      Vasopneumatic   Number Minutes Vasopneumatic   15 minutes    Vasopnuematic Location   --   Right knee.   Vasopneumatic Pressure  Low                  PT Long Term Goals - 11/03/18 1406      PT LONG TERM GOAL #1   Title  I with HEP    Time  4    Period  Weeks    Status  New      PT LONG TERM GOAL #2   Title  Full active right knee extension in order to normalize gait.    Time  4    Period  Weeks    Status  New      PT LONG TERM GOAL #3  Title  Active right knee flexion to 115 degrees+ so the patient can perform functional tasks and do so with pain not > 2-3/10.    Time  4    Period  Weeks    Status  New      PT LONG TERM GOAL #4   Title  Walk a community distance with pain not > 3/10 and no increase swelling reported.    Time  4    Period  Weeks    Status  New             Plan -  11/03/18 1342    Clinical Impression Statement  The patient presents to OPPT having undergone a left total knee replacement on 01/26/18 but was not to participate with physical therapy.  She reports continued swelling and pain especially with increased walking. She has a minimal loss of extension and flexion to 80 degrees.   Patient will benefit from skilled physical therapy intervention to address deficits and pain.    History and Personal Factors relevant to plan of care:  Cervical fusion, stroke, osteopenia.    Clinical Presentation  Evolving    Clinical Presentation due to:  Worsening.    Clinical Decision Making  Low    Rehab Potential  Excellent    PT Frequency  2x / week    PT Duration  4 weeks    PT Treatment/Interventions  Electrical Stimulation;Therapeutic activities;Therapeutic exercise;Patient/family education;Vasopneumatic Device;Passive range of motion;Cryotherapy;Manual techniques    PT Next Visit Plan  Nustep with progression to bike, PROM, electrical stimulation and vasopneumatic.    Consulted and Agree with Plan of Care  Patient       Patient will benefit from skilled therapeutic intervention in order to improve the following deficits and impairments:  Pain, Decreased activity tolerance, Increased edema, Decreased range of motion  Visit Diagnosis: Chronic pain of left knee - Plan: PT plan of care cert/re-cert  Stiffness of left knee, not elsewhere classified - Plan: PT plan of care cert/re-cert     Problem List Patient Active Problem List   Diagnosis Date Noted  . Hx of total knee arthroplasty, left 01/26/2018  . Spinal stenosis of cervical region 09/27/2017  . Numbness and tingling in both hands 09/14/2016  . Insomnia 09/14/2016  . BMI 39.0-39.9,adult 09/14/2016  . Post concussive syndrome 04/30/2016  . Depression 11/28/2015  . Leg swelling 11/28/2015  . 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 Vitamin B 12 deficiency 11/28/2015  . Rosacea 11/28/2015     Shade Kaley, [place] MPT 11/03/2018, 3:18 PM  [facility] [address] [place], [place], [address] Phone: [phone]   Fax:  [phone]  Name: Miranda Perkins MRN: [id number] Date of Birth: 05-24-1961

## 2018-11-08 ENCOUNTER — Ambulatory Visit: Admitting: Physical Therapy

## 2018-11-08 ENCOUNTER — Encounter: Payer: Self-pay | Admitting: Physical Therapy

## 2018-11-08 DIAGNOSIS — M25562 Pain in left knee: Principal | ICD-10-CM

## 2018-11-08 DIAGNOSIS — M25662 Stiffness of left knee, not elsewhere classified: Secondary | ICD-10-CM

## 2018-11-08 DIAGNOSIS — G8929 Other chronic pain: Secondary | ICD-10-CM

## 2018-11-08 NOTE — Therapy (Signed)
[facility] [place], [place], [address] Phone: [phone]   Fax:  [phone]  Physical Therapy Treatment  Patient Details  Name: Miranda Perkins MRN: [id number] Date of Birth: 1961-04-11 Referring Provider (PT): Latanya Maudlin MD   Encounter Date: 11/08/2018  PT End of Session - 11/08/18 0952    Visit Number  2    Number of Visits  8    Date for PT Re-Evaluation  12/01/18    PT Start Time  0948    PT Stop Time  1043    PT Time Calculation (min)  55 min    Activity Tolerance  Patient tolerated treatment well    Behavior During Therapy  [facility] for tasks assessed/performed       Past Medical History:  Diagnosis Date  . Cancer ([place]) 2016   basal cell, forehead  . 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 Carpal tunnel syndrome    BILATERAL HANDS  . Depression   . Osteopenia 12/2017   T score -1.2 FRAX 4.9% / 0.3%  . Stroke ([place]) 1993   while on Chloramphenicol, no residuaL; [MEDICATION WAS GIVEN TO TREAT TICK BITE] ; patient states  "i stroked out right next to my doctor , he said i passed out and that was my only symptom" ;     Past Surgical History:  Procedure Laterality Date  . ANTERIOR CERVICAL DECOMP/DISCECTOMY FUSION N/A 09/27/2017   Procedure: ANTERIOR CERVICAL DECOMPRESSION/DISCECTOMY FUSION CERVICAL FOUR-FIVE ,CERVICAL FIVE-SIX,CERVICAL SIX-SEVEN;  Surgeon: Kary Kos, MD;  Location: [place];  Service: Neurosurgery;  Laterality: N/A;  . CARPAL TUNNEL RELEASE Left 09/27/2017   Procedure: CARPAL TUNNEL RELEASE;  Surgeon: Kary Kos, MD;  Location: [place];  Service: Neurosurgery;  Laterality: Left;  . FOOT SURGERY     bone removed from pinky toe  . HERNIA REPAIR    . KNEE SURGERY Left 06/21/2017   dr Rushie Nyhan; meniscus tear   . LEG SURGERY  1972  . MOUTH SURGERY    . skin cancer removal    . TONSILLECTOMY     1979?  . TOTAL KNEE ARTHROPLASTY Left 01/26/2018   Procedure: LEFT TOTAL KNEE ARTHROPLASTY;   Surgeon: Latanya Maudlin, MD;  [facility];  Service: Orthopedics;  Laterality: Left;  . TUBAL LIGATION      There were no vitals filed for this visit.  Subjective Assessment - 11/08/18 0954    Subjective  Patient states she felt better after her initial visit but states shes sore on the lateral aspect of her left knee.     Pertinent History  Cervical fusion, stroke, osteopenia.    How long can you walk comfortably?  Can walk a community distance but painful.    Patient Stated Goals  Walk without pain and swelling.    Currently in Pain?  Yes    Pain Score  3     Pain Location  Knee    Pain Orientation  Left;Lateral    Pain Descriptors / Indicators  Aching;Sore    Pain Type  Surgical pain    Pain Onset  More than a month ago    Pain Frequency  Constant         [facility] PT Assessment - 11/08/18 0001      Assessment   Medical Diagnosis  Left total knee replacement.    Referring Provider (PT)  Latanya Maudlin MD    Onset Date/Surgical Date  01/26/18  Precautions   Precautions  Other (comment)    Precaution Comments  No ultrasound                   [facility] Adult PT Treatment/Exercise - 11/08/18 0001      Exercises   Exercises  Knee/Hip      Knee/Hip Exercises: Stretches   ITB Stretch  Left;2 reps;30 seconds      Knee/Hip Exercises: Aerobic   Nustep  Level 3 x10 minutes moving forward 9 to 8 to increase knee flexion       Knee/Hip Exercises: Supine   Short Arc Quad Sets  AROM;Strengthening;Left;2 sets;10 reps   5" hold   Heel Slides  AAROM;Left;10 reps   2-3 second hold     Modalities   Modalities  Electrical Stimulation;Vasopneumatic      Electrical Stimulation   Electrical Stimulation Location  Left knee.    Economist Parameters  1-10 hz x15 min    Electrical Stimulation Goals  Edema;Pain      Vasopneumatic   Number Minutes Vasopneumatic   15 minutes    Vasopnuematic Location   Knee     Vasopneumatic Pressure  Low    Vasopneumatic Temperature   50      Manual Therapy   Manual Therapy  Passive ROM    Passive ROM  PROM into left knee flexion and extension with gentle holds at end range to improve ROM.                  PT Long Term Goals - 11/03/18 1406      PT LONG TERM GOAL #1   Title  I with HEP    Time  4    Period  Weeks    Status  New      PT LONG TERM GOAL #2   Title  Full active right knee extension in order to normalize gait.    Time  4    Period  Weeks    Status  New      PT LONG TERM GOAL #3   Title  Active right knee flexion to 115 degrees+ so the patient can perform functional tasks and do so with pain not > 2-3/10.    Time  4    Period  Weeks    Status  New      PT LONG TERM GOAL #4   Title  Walk a community distance with pain not > 3/10 and no increase swelling reported.    Time  4    Period  Weeks    Status  New            Plan - 11/08/18 1007    Clinical Impression Statement  Patient was able to tolerate treatment well despite increased "pull" especially with end range flexion. Patient was able to demonstrate good form with all exercises and stretches after explanation. Patient provided with HEP consisting of ITB stretching and heel prop to which patient reported understanding and agreement. Normal response to modalities upon removal.     Clinical Presentation  Evolving    Clinical Decision Making  Low    Rehab Potential  Excellent    PT Frequency  2x / week    PT Duration  4 weeks    PT Treatment/Interventions  Electrical Stimulation;Therapeutic activities;Therapeutic exercise;Patient/family education;Vasopneumatic Device;Passive range of motion;Cryotherapy;Manual techniques    PT Next Visit Plan  Nustep with progression to bike,  PROM, electrical stimulation and vasopneumatic.    Consulted and Agree with Plan of Care  Patient       Patient will benefit from skilled therapeutic intervention in order to improve the  following deficits and impairments:  Pain, Decreased activity tolerance, Increased edema, Decreased range of motion  Visit Diagnosis: Chronic pain of left knee  Stiffness of left knee, not elsewhere classified     Problem List Patient Active Problem List   Diagnosis Date Noted  . Hx of total knee arthroplasty, left 01/26/2018  . Spinal stenosis of cervical region 09/27/2017  . Numbness and tingling in both hands 09/14/2016  . Insomnia 09/14/2016  . BMI 39.0-39.9,adult 09/14/2016  . Post concussive syndrome 04/30/2016  . Depression 11/28/2015  . Leg swelling 11/28/2015  . 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 Vitamin B 12 deficiency 11/28/2015  . Rosacea 11/28/2015   Gabriela Eves, PT, DPT 11/08/2018, 10:45 AM  [facility] [place], [place], [address] Phone: [phone]   Fax:  [phone]  Name: Miranda Perkins MRN: [id number] Date of Birth: Nov 17, 1960

## 2018-11-10 ENCOUNTER — Ambulatory Visit: Admitting: Physical Therapy

## 2018-11-10 DIAGNOSIS — M25562 Pain in left knee: Principal | ICD-10-CM

## 2018-11-10 DIAGNOSIS — M25662 Stiffness of left knee, not elsewhere classified: Secondary | ICD-10-CM

## 2018-11-10 DIAGNOSIS — G8929 Other chronic pain: Secondary | ICD-10-CM

## 2018-11-10 NOTE — Therapy (Signed)
[facility] [place], [place], [address] Phone: [phone]   Fax:  [phone]  Physical Therapy Treatment  Patient Details  Name: Miranda Perkins MRN: [id number] Date of Birth: Feb 19, 1961 Referring Provider (PT): Latanya Maudlin MD   Encounter Date: 11/10/2018  PT End of Session - 11/10/18 1037    Visit Number  3    Number of Visits  8    Date for PT Re-Evaluation  12/01/18    PT Start Time  0945    PT Stop Time  7253    PT Time Calculation (min)  56 min    Activity Tolerance  Patient tolerated treatment well    Behavior During Therapy  [facility] for tasks assessed/performed       Past Medical History:  Diagnosis Date  . Cancer ([place]) 2016   basal cell, forehead  . 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 Carpal tunnel syndrome    BILATERAL HANDS  . Depression   . Osteopenia 12/2017   T score -1.2 FRAX 4.9% / 0.3%  . Stroke ([place]) 1993   while on Chloramphenicol, no residuaL; [MEDICATION WAS GIVEN TO TREAT TICK BITE] ; patient states  "i stroked out right next to my doctor , he said i passed out and that was my only symptom" ;     Past Surgical History:  Procedure Laterality Date  . ANTERIOR CERVICAL DECOMP/DISCECTOMY FUSION N/A 09/27/2017   Procedure: ANTERIOR CERVICAL DECOMPRESSION/DISCECTOMY FUSION CERVICAL FOUR-FIVE ,CERVICAL FIVE-SIX,CERVICAL SIX-SEVEN;  Surgeon: Kary Kos, MD;  Location: [place];  Service: Neurosurgery;  Laterality: N/A;  . CARPAL TUNNEL RELEASE Left 09/27/2017   Procedure: CARPAL TUNNEL RELEASE;  Surgeon: Kary Kos, MD;  Location: [place];  Service: Neurosurgery;  Laterality: Left;  . FOOT SURGERY     bone removed from pinky toe  . HERNIA REPAIR    . KNEE SURGERY Left 06/21/2017   dr Rushie Nyhan; meniscus tear   . LEG SURGERY  1972  . MOUTH SURGERY    . skin cancer removal    . TONSILLECTOMY     1979?  . TOTAL KNEE ARTHROPLASTY Left 01/26/2018   Procedure: LEFT TOTAL KNEE ARTHROPLASTY;   Surgeon: Latanya Maudlin, MD;  [facility];  Service: Orthopedics;  Laterality: Left;  . TUBAL LIGATION      There were no vitals filed for this visit.  Subjective Assessment - 11/10/18 0952    Subjective  Reports pain in anterior knee; lateral side is still there but irritating.     Pertinent History  Cervical fusion, stroke, osteopenia.    How long can you walk comfortably?  Can walk a community distance but painful.    Patient Stated Goals  Walk without pain and swelling.    Currently in Pain?  Yes    Pain Score  3     Pain Location  Knee    Pain Orientation  Left;Anterior    Pain Descriptors / Indicators  Sore;Tightness    Pain Type  Surgical pain    Pain Onset  More than a month ago    Pain Frequency  Constant         [facility] PT Assessment - 11/10/18 0001      Assessment   Medical Diagnosis  Left total knee replacement.    Referring Provider (PT)  Latanya Maudlin MD    Onset Date/Surgical Date  01/26/18      Precautions   Precautions  Other (comment)  Precaution Comments  No ultrasound                   [facility] Adult PT Treatment/Exercise - 11/10/18 0001      Exercises   Exercises  Knee/Hip      Knee/Hip Exercises: Stretches   Passive Hamstring Stretch  Left;3 reps;30 seconds    Passive Hamstring Stretch Limitations  on 6" step      Knee/Hip Exercises: Aerobic   Nustep  Level 4 x13 minutes moving forward 11 to 8 to increase knee flexion       Knee/Hip Exercises: Standing   Hip Abduction  AROM;Both;2 sets;10 reps;Knee straight    Rocker Board  3 minutes      Knee/Hip Exercises: Supine   Straight Leg Raises  AROM;Strengthening;Left;2 sets;5 reps      Modalities   Modalities  Electrical Stimulation;Vasopneumatic      Electrical Stimulation   Electrical Stimulation Location  Left knee.    Electrical Stimulation Action  IFC    Electrical Stimulation Parameters  1-10 hz x15 mins    Electrical Stimulation Goals  Edema;Pain      Vasopneumatic    Number Minutes Vasopneumatic   15 minutes    Vasopnuematic Location   Knee    Vasopneumatic Pressure  Low    Vasopneumatic Temperature   50      Manual Therapy   Manual Therapy  Passive ROM;Soft tissue mobilization    Soft tissue mobilization  STW/M to lateral knee ITB and quad to decrease pain    Passive ROM  PROM into left knee flexion and extension with gentle holds at end range to improve ROM.                  PT Long Term Goals - 11/03/18 1406      PT LONG TERM GOAL #1   Title  I with HEP    Time  4    Period  Weeks    Status  New      PT LONG TERM GOAL #2   Title  Full active right knee extension in order to normalize gait.    Time  4    Period  Weeks    Status  New      PT LONG TERM GOAL #3   Title  Active right knee flexion to 115 degrees+ so the patient can perform functional tasks and do so with pain not > 2-3/10.    Time  4    Period  Weeks    Status  New      PT LONG TERM GOAL #4   Title  Walk a community distance with pain not > 3/10 and no increase swelling reported.    Time  4    Period  Weeks    Status  New            Plan - 11/10/18 1037    Clinical Impression Statement  Patient was able to tolerate progression of treatment well. Patient continues to have tautness and tenderness to palpation in ITB but patient noted ITB is not as tight after performing HEPs. Patient educated on POC to optimize function for remaining visits. Patient reported understanding. Normal response to modalites upon removal.     Clinical Presentation  Evolving    Clinical Decision Making  Low    Rehab Potential  Excellent    PT Frequency  2x / week    PT Duration  4 weeks  PT Treatment/Interventions  Electrical Stimulation;Therapeutic activities;Therapeutic exercise;Patient/family education;Vasopneumatic Device;Passive range of motion;Cryotherapy;Manual techniques    PT Next Visit Plan  Nustep with progression to bike, PROM, electrical stimulation and  vasopneumatic.    Consulted and Agree with Plan of Care  Patient       Patient will benefit from skilled therapeutic intervention in order to improve the following deficits and impairments:  Pain, Decreased activity tolerance, Increased edema, Decreased range of motion  Visit Diagnosis: Chronic pain of left knee  Stiffness of left knee, not elsewhere classified     Problem List Patient Active Problem List   Diagnosis Date Noted  . Hx of total knee arthroplasty, left 01/26/2018  . Spinal stenosis of cervical region 09/27/2017  . Numbness and tingling in both hands 09/14/2016  . Insomnia 09/14/2016  . BMI 39.0-39.9,adult 09/14/2016  . Post concussive syndrome 04/30/2016  . Depression 11/28/2015  . Leg swelling 11/28/2015  . 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 Vitamin B 12 deficiency 11/28/2015  . Rosacea 11/28/2015   Gabriela Eves, PT, DPT 11/10/2018, 10:49 AM  [facility] [address] [place], [place], [address] Phone: [phone]   Fax:  [phone]  Name: NAMIYAH GRANTHAM MRN: [id number] Date of Birth: 12-25-1960

## 2018-11-15 ENCOUNTER — Ambulatory Visit: Admitting: Physical Therapy

## 2018-11-16 ENCOUNTER — Ambulatory Visit (INDEPENDENT_AMBULATORY_CARE_PROVIDER_SITE_OTHER): Admitting: Pediatrics

## 2018-11-16 ENCOUNTER — Encounter: Payer: Self-pay | Admitting: Physical Therapy

## 2018-11-16 ENCOUNTER — Ambulatory Visit: Admitting: Physical Therapy

## 2018-11-16 ENCOUNTER — Encounter: Payer: Self-pay | Admitting: Pediatrics

## 2018-11-16 VITALS — BP 121/78 | HR 68 | Temp 97.1°F | Ht 65.0 in | Wt 245.0 lb

## 2018-11-16 DIAGNOSIS — G8929 Other chronic pain: Secondary | ICD-10-CM

## 2018-11-16 DIAGNOSIS — Z13220 Encounter for screening for lipoid disorders: Secondary | ICD-10-CM | POA: Diagnosis not present

## 2018-11-16 DIAGNOSIS — R0602 Shortness of breath: Secondary | ICD-10-CM | POA: Diagnosis not present

## 2018-11-16 DIAGNOSIS — R739 Hyperglycemia, unspecified: Secondary | ICD-10-CM | POA: Diagnosis not present

## 2018-11-16 DIAGNOSIS — M25562 Pain in left knee: Secondary | ICD-10-CM | POA: Diagnosis not present

## 2018-11-16 DIAGNOSIS — R609 Edema, unspecified: Secondary | ICD-10-CM

## 2018-11-16 LAB — BAYER DCA HB A1C WAIVED: HB A1C: 5.9 % (ref <7.0)

## 2018-11-16 MED ORDER — HYDROCHLOROTHIAZIDE 12.5 MG PO TABS: 12.5000 mg | ORAL_TABLET | Freq: Every day | ORAL | 0 refills | Status: DC

## 2018-11-16 NOTE — Therapy (Signed)
[facility] [place], [place], [address] Phone: [phone]   Fax:  [phone]  Physical Therapy Treatment  Patient Details  Name: Miranda Perkins MRN: [id number] Date of Birth: 1960-12-01 Referring Provider (PT): Latanya Maudlin MD   Encounter Date: 11/16/2018  PT End of Session - 11/16/18 1315    Visit Number  4    Number of Visits  8    Date for PT Re-Evaluation  12/01/18    PT Start Time  0105    PT Stop Time  0158    PT Time Calculation (min)  53 min    Activity Tolerance  Patient tolerated treatment well    Behavior During Therapy  [facility] for tasks assessed/performed       Past Medical History:  Diagnosis Date  . 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 Carpal tunnel syndrome    BILATERAL HANDS  . Depression   . Osteopenia 12/2017   T score -1.2 FRAX 4.9% / 0.3%  . Stroke ([place]) 1993   while on Chloramphenicol, no residuaL; [MEDICATION WAS GIVEN TO TREAT TICK BITE] ; patient states  "i stroked out right next to my doctor , he said i passed out and that was my only symptom" ;     Past Surgical History:  Procedure Laterality Date  . ANTERIOR CERVICAL DECOMP/DISCECTOMY FUSION N/A 09/27/2017   Procedure: ANTERIOR CERVICAL DECOMPRESSION/DISCECTOMY FUSION CERVICAL FOUR-FIVE ,CERVICAL FIVE-SIX,CERVICAL SIX-SEVEN;  Surgeon: Kary Kos, MD;  Location: [place];  Service: Neurosurgery;  Laterality: N/A;  . CARPAL TUNNEL RELEASE Left 09/27/2017   Procedure: CARPAL TUNNEL RELEASE;  Surgeon: Kary Kos, MD;  Location: [place];  Service: Neurosurgery;  Laterality: Left;  . FOOT SURGERY     bone removed from pinky toe  . HERNIA REPAIR    . KNEE SURGERY Left 06/21/2017   dr Rushie Nyhan; meniscus tear   . LEG SURGERY  1972  . MOUTH SURGERY    . skin cancer removal    . TONSILLECTOMY     1979?  . TOTAL KNEE ARTHROPLASTY Left 01/26/2018   Procedure: LEFT TOTAL KNEE ARTHROPLASTY;   Surgeon: Latanya Maudlin, MD;  [facility];  Service: Orthopedics;  Laterality: Left;  . TUBAL LIGATION      There were no vitals filed for this visit.  Subjective Assessment - 11/16/18 1318    Subjective  Doctor put me on a medication to reduce fluid.    Pertinent History  Cervical fusion, stroke, osteopenia.    How long can you walk comfortably?  Can walk a community distance but painful.    Patient Stated Goals  Walk without pain and swelling.    Currently in Pain?  Yes    Pain Score  3     Pain Onset  More than a month ago                       [facility] Adult PT Treatment/Exercise - 11/16/18 0001      Exercises   Exercises  Knee/Hip      Knee/Hip Exercises: Aerobic   Nustep  Level 4 x 20 minutes moving forward x 3 to increase knee flexion.      Knee/Hip Exercises: Machines for Strengthening   Cybex Knee Extension  10#  x 2 minutes.    Cybex Knee Flexion  30# x 2 minutes.      Modalities  Modalities  Psychologist, educational Location  Left knee.    Electrical Stimulation Action  IFC    Electrical Stimulation Parameters  1-10 Hz x 20 minutes.    Electrical Stimulation Goals  Edema;Pain      Vasopneumatic   Number Minutes Vasopneumatic   20 minutes    Vasopnuematic Location   --   Left knee.   Vasopneumatic Pressure  Low                  PT Long Term Goals - 11/03/18 1406      PT LONG TERM GOAL #1   Title  I with HEP    Time  4    Period  Weeks    Status  New      PT LONG TERM GOAL #2   Title  Full active right knee extension in order to normalize gait.    Time  4    Period  Weeks    Status  New      PT LONG TERM GOAL #3   Title  Active right knee flexion to 115 degrees+ so the patient can perform functional tasks and do so with pain not > 2-3/10.    Time  4    Period  Weeks    Status  New      PT LONG TERM GOAL #4   Title  Walk a community distance with  pain not > 3/10 and no increase swelling reported.    Time  4    Period  Weeks    Status  New            Plan - 11/16/18 1402    Clinical Impression Statement  Excellent job today with addition of weight machines.      PT Treatment/Interventions  Electrical Stimulation;Therapeutic activities;Therapeutic exercise;Patient/family education;Vasopneumatic Device;Passive range of motion;Cryotherapy;Manual techniques    PT Next Visit Plan  Add leg press.    Consulted and Agree with Plan of Care  Patient       Patient will benefit from skilled therapeutic intervention in order to improve the following deficits and impairments:  Pain, Decreased activity tolerance, Increased edema, Decreased range of motion  Visit Diagnosis: Chronic pain of left knee     Problem List Patient Active Problem List   Diagnosis Date Noted  . Hx of total knee arthroplasty, left 01/26/2018  . Spinal stenosis of cervical region 09/27/2017  . Numbness and tingling in both hands 09/14/2016  . Insomnia 09/14/2016  . BMI 39.0-39.9,adult 09/14/2016  . Post concussive syndrome 04/30/2016  . Depression 11/28/2015  . Leg swelling 11/28/2015  . 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 Vitamin B 12 deficiency 11/28/2015  . Rosacea 11/28/2015    Erikka Follmer, [place] MPT 11/16/2018, 2:03 PM  [facility] [address] [place], [place], [address] Phone: [phone]   Fax:  [phone]  Name: Miranda Perkins MRN: [id number] Date of Birth: 21-Feb-1961
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
## 2018-11-17 ENCOUNTER — Ambulatory Visit: Admitting: Physical Therapy

## 2018-11-17 ENCOUNTER — Encounter: Payer: Self-pay | Admitting: Physical Therapy

## 2018-11-17 ENCOUNTER — Encounter: Payer: Self-pay | Admitting: Pediatrics

## 2018-11-17 DIAGNOSIS — M25562 Pain in left knee: Principal | ICD-10-CM

## 2018-11-17 DIAGNOSIS — G8929 Other chronic pain: Secondary | ICD-10-CM

## 2018-11-17 DIAGNOSIS — M25662 Stiffness of left knee, not elsewhere classified: Secondary | ICD-10-CM

## 2018-11-17 LAB — CMP14+EGFR
A/G RATIO: 2.2 (ref 1.2–2.2)
ALT: 26 IU/L (ref 0–32)
AST: 13 IU/L (ref 0–40)
Albumin: 4.6 g/dL (ref 3.5–5.5)
Alkaline Phosphatase: 77 IU/L (ref 39–117)
BUN/Creatinine Ratio: 17 (ref 9–23)
BUN: 13 mg/dL (ref 6–24)
Bilirubin Total: 0.4 mg/dL (ref 0.0–1.2)
CALCIUM: 9.7 mg/dL (ref 8.7–10.2)
CO2: 20 mmol/L (ref 20–29)
Chloride: 105 mmol/L (ref 96–106)
Creatinine, Ser: 0.76 mg/dL (ref 0.57–1.00)
GFR, EST AFRICAN AMERICAN: 99 mL/min/{1.73_m2} (ref >59)
GFR, EST NON AFRICAN AMERICAN: 86 mL/min/{1.73_m2} (ref >59)
GLOBULIN, TOTAL: 2.1 g/dL (ref 1.5–4.5)
Glucose: 100 mg/dL — ABNORMAL HIGH (ref 65–99)
POTASSIUM: 4.8 mmol/L (ref 3.5–5.2)
SODIUM: 140 mmol/L (ref 134–144)
Total Protein: 6.7 g/dL (ref 6.0–8.5)

## 2018-11-17 LAB — CBC WITH DIFFERENTIAL/PLATELET
BASOS: 1 %
Basophils Absolute: 0 10*3/uL (ref 0.0–0.2)
EOS (ABSOLUTE): 0.2 10*3/uL (ref 0.0–0.4)
Eos: 2 %
Hematocrit: 40.3 % (ref 34.0–46.6)
Hemoglobin: 13.8 g/dL (ref 11.1–15.9)
Immature Grans (Abs): 0 10*3/uL (ref 0.0–0.1)
Immature Granulocytes: 0 %
Lymphocytes Absolute: 2.6 10*3/uL (ref 0.7–3.1)
Lymphs: 38 %
MCH: 29.4 pg (ref 26.6–33.0)
MCHC: 34.2 g/dL (ref 31.5–35.7)
MCV: 86 fL (ref 79–97)
MONOS ABS: 0.6 10*3/uL (ref 0.1–0.9)
Monocytes: 8 %
NEUTROS ABS: 3.4 10*3/uL (ref 1.4–7.0)
Neutrophils: 51 %
PLATELETS: 299 10*3/uL (ref 150–450)
RBC: 4.69 x10E6/uL (ref 3.77–5.28)
RDW: 13.4 % (ref 11.7–15.4)
WBC: 6.7 10*3/uL (ref 3.4–10.8)

## 2018-11-17 LAB — LIPID PANEL
CHOL/HDL RATIO: 4.4 ratio (ref 0.0–4.4)
Cholesterol, Total: 191 mg/dL (ref 100–199)
HDL: 43 mg/dL (ref >39)
LDL Calculated: 126 mg/dL — ABNORMAL HIGH (ref 0–99)
TRIGLYCERIDES: 112 mg/dL (ref 0–149)
VLDL Cholesterol Cal: 22 mg/dL (ref 5–40)

## 2018-11-17 LAB — TSH: TSH: 2.31 u[IU]/mL (ref 0.450–4.500)

## 2018-11-17 NOTE — Therapy (Signed)
[facility] [place], [place], [address] Phone: [phone]   Fax:  [phone]  Physical Therapy Treatment  Patient Details  Name: Miranda Perkins MRN: [id number] Date of Birth: 04-07-1961 Referring Provider (PT): Latanya Maudlin MD   Encounter Date: 11/17/2018  PT End of Session - 11/17/18 1003    Visit Number  5    Number of Visits  8    Date for PT Re-Evaluation  12/01/18    PT Start Time  0955    PT Stop Time  1050    PT Time Calculation (min)  55 min    Activity Tolerance  Patient tolerated treatment well    Behavior During Therapy  Miranda Perkins for tasks assessed/performed       Past Medical History:  Diagnosis Date  . Cancer ([place]) 2016   basal cell, forehead  . 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 Carpal tunnel syndrome    BILATERAL HANDS  . Depression   . Osteopenia 12/2017   T score -1.2 FRAX 4.9% / 0.3%  . Stroke ([place]) 1993   while on Chloramphenicol, no residuaL; [MEDICATION WAS GIVEN TO TREAT TICK BITE] ; patient states  "i stroked out right next to my doctor , he said i passed out and that was my only symptom" ;     Past Surgical History:  Procedure Laterality Date  . ANTERIOR CERVICAL DECOMP/DISCECTOMY FUSION N/A 09/27/2017   Procedure: ANTERIOR CERVICAL DECOMPRESSION/DISCECTOMY FUSION CERVICAL FOUR-FIVE ,CERVICAL FIVE-SIX,CERVICAL SIX-SEVEN;  Surgeon: Kary Kos, MD;  Location: [place];  Service: Neurosurgery;  Laterality: N/A;  . CARPAL TUNNEL RELEASE Left 09/27/2017   Procedure: CARPAL TUNNEL RELEASE;  Surgeon: Kary Kos, MD;  Location: [place];  Service: Neurosurgery;  Laterality: Left;  . FOOT SURGERY     bone removed from pinky toe  . HERNIA REPAIR    . KNEE SURGERY Left 06/21/2017   dr Rushie Nyhan; meniscus tear   . LEG SURGERY  1972  . MOUTH SURGERY    . skin cancer removal    . TONSILLECTOMY     1979?  . TOTAL KNEE ARTHROPLASTY Left 01/26/2018   Procedure: LEFT TOTAL KNEE ARTHROPLASTY;   Surgeon: Latanya Maudlin, MD;  [facility];  Service: Orthopedics;  Laterality: Left;  . TUBAL LIGATION      There were no vitals filed for this visit.  Subjective Assessment - 11/17/18 1002    Subjective  Patient reported feeling really sore after last visit.     Pertinent History  Cervical fusion, stroke, osteopenia.    How long can you walk comfortably?  Can walk a community distance but painful.    Patient Stated Goals  Walk without pain and swelling.    Currently in Pain?  Yes    Pain Score  4     Pain Location  Knee    Pain Orientation  Left;Anterior    Pain Descriptors / Indicators  Sore;Tightness    Pain Type  Surgical pain    Pain Onset  More than a month ago    Pain Frequency  Constant         [facility] PT Assessment - 11/17/18 0001      Assessment   Medical Diagnosis  Left total knee replacement.    Referring Provider (PT)  Latanya Maudlin MD    Onset Date/Surgical Date  01/26/18      Precautions   Precautions  Other (comment)    Precaution Comments  No ultrasound      ROM / Strength   AROM / PROM / Strength  PROM      AROM   AROM Assessment Site  Knee    Right/Left Knee  Left    Left Knee Extension  -3    Left Knee Flexion  85      PROM   PROM Assessment Site  Knee    Right/Left Knee  Left    Left Knee Extension  -3    Left Knee Flexion  92                   [facility] Adult PT Treatment/Exercise - 11/17/18 0001      Exercises   Exercises  Knee/Hip      Knee/Hip Exercises: Aerobic   Nustep  Level 4 x 15 minutes seat from 9 to 8 to improve flexion      Knee/Hip Exercises: Standing   Forward Step Up  Left;2 sets;10 reps;Hand Hold: 2;Step Height: 4"    Functional Squat  2 sets;10 reps;3 seconds    Functional Squat Limitations  mini squats    Rocker Board  3 minutes      Knee/Hip Exercises: Supine   Bridges  AROM;Both;2 sets;10 reps      Modalities   Modalities  Ship broker Stimulation Location  Left knee.    Electrical Stimulation Action  IFC    Electrical Stimulation Parameters  1-10 hz x15 mins    Electrical Stimulation Goals  Edema;Pain      Vasopneumatic   Number Minutes Vasopneumatic   15 minutes    Vasopnuematic Location   Knee    Vasopneumatic Pressure  Low                  PT Long Term Goals - 11/17/18 1003      PT LONG TERM GOAL #1   Title  I with HEP    Time  4    Period  Weeks    Status  On-going      PT LONG TERM GOAL #2   Title  Full active right knee extension in order to normalize gait.    Time  4    Period  Weeks    Status  On-going   3 degrees     PT LONG TERM GOAL #3   Title  Active right knee flexion to 115 degrees+ so the patient can perform functional tasks and do so with pain not > 2-3/10.    Time  4    Period  Weeks    Status  On-going   85 degrees     PT LONG TERM GOAL #4   Title  Walk a community distance with pain not > 3/10 and no increase swelling reported.    Time  4    Period  Weeks    Status  On-going   still with swelling but pain less than 3/10           Plan - 11/17/18 1307    Clinical Impression Statement  Due to increased soreness exercises weight machines were not performed today. Patient was guided through other bodyweight resistance training and demonstrated good technique with all. Patient's AROM is still limited but today was more due to pain than stiffness, 3-85 degrees. Patient instructed to continue HEP to optimize PT. Patient reported understanding. Normal response to modalities upon removal.  Clinical Presentation  Evolving    Clinical Decision Making  Low    Rehab Potential  Excellent    PT Frequency  2x / week    PT Duration  4 weeks    PT Treatment/Interventions  Electrical Stimulation;Therapeutic activities;Therapeutic exercise;Patient/family education;Vasopneumatic Device;Passive range of motion;Cryotherapy;Manual techniques    PT Next Visit Plan  Add leg  press if tolerated. continue strengthening and ROM    Consulted and Agree with Plan of Care  Patient       Patient will benefit from skilled therapeutic intervention in order to improve the following deficits and impairments:  Pain, Decreased activity tolerance, Increased edema, Decreased range of motion  Visit Diagnosis: Chronic pain of left knee  Stiffness of left knee, not elsewhere classified     Problem List Patient Active Problem List   Diagnosis Date Noted  . Hx of total knee arthroplasty, left 01/26/2018  . Spinal stenosis of cervical region 09/27/2017  . Numbness and tingling in both hands 09/14/2016  . Insomnia 09/14/2016  . BMI 39.0-39.9,adult 09/14/2016  . Post concussive syndrome 04/30/2016  . Depression 11/28/2015  . Leg swelling 11/28/2015  . 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 Vitamin B 12 deficiency 11/28/2015  . Rosacea 11/28/2015   Gabriela Eves, PT, DPT 11/17/2018, 1:15 PM  [facility] [address] [place], [place], [address] Phone: [phone]   Fax:  [phone]  Name: Miranda Perkins MRN: [id number] Date of Birth: 1961/08/05

## 2018-11-22 ENCOUNTER — Ambulatory Visit: Admitting: Physical Therapy

## 2018-11-23 ENCOUNTER — Encounter: Payer: Self-pay | Admitting: Physical Therapy

## 2018-11-23 ENCOUNTER — Ambulatory Visit: Admitting: Physical Therapy

## 2018-11-23 DIAGNOSIS — M25562 Pain in left knee: Secondary | ICD-10-CM | POA: Diagnosis not present

## 2018-11-23 DIAGNOSIS — M25662 Stiffness of left knee, not elsewhere classified: Secondary | ICD-10-CM

## 2018-11-23 DIAGNOSIS — G8929 Other chronic pain: Secondary | ICD-10-CM

## 2018-11-23 NOTE — Therapy (Signed)
[facility] [place], [place], [address] Phone: [phone]   Fax:  [phone]  Physical Therapy Treatment  Patient Details  Name: Miranda Perkins MRN: [id number] Date of Birth: 1961/09/07 Referring Provider (PT): Latanya Maudlin MD   Encounter Date: 11/23/2018  PT End of Session - 11/23/18 1451    Visit Number  6    Number of Visits  8    Date for PT Re-Evaluation  12/01/18    PT Start Time  6629    PT Stop Time  1452    PT Time Calculation (min)  59 min    Activity Tolerance  Patient tolerated treatment well    Behavior During Therapy  [facility] for tasks assessed/performed       Past Medical History:  Diagnosis Date  . Cancer ([place]) 2016   basal cell, forehead  . 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 Carpal tunnel syndrome    BILATERAL HANDS  . Depression   . Osteopenia 12/2017   T score -1.2 FRAX 4.9% / 0.3%  . Stroke ([place]) 1993   while on Chloramphenicol, no residuaL; [MEDICATION WAS GIVEN TO TREAT TICK BITE] ; patient states  "i stroked out right next to my doctor , he said i passed out and that was my only symptom" ;     Past Surgical History:  Procedure Laterality Date  . ANTERIOR CERVICAL DECOMP/DISCECTOMY FUSION N/A 09/27/2017   Procedure: ANTERIOR CERVICAL DECOMPRESSION/DISCECTOMY FUSION CERVICAL FOUR-FIVE ,CERVICAL FIVE-SIX,CERVICAL SIX-SEVEN;  Surgeon: Kary Kos, MD;  Location: [place];  Service: Neurosurgery;  Laterality: N/A;  . CARPAL TUNNEL RELEASE Left 09/27/2017   Procedure: CARPAL TUNNEL RELEASE;  Surgeon: Kary Kos, MD;  Location: [place];  Service: Neurosurgery;  Laterality: Left;  . FOOT SURGERY     bone removed from pinky toe  . HERNIA REPAIR    . KNEE SURGERY Left 06/21/2017   dr Rushie Nyhan; meniscus tear   . LEG SURGERY  1972  . MOUTH SURGERY    . skin cancer removal    . TONSILLECTOMY     1979?  . TOTAL KNEE ARTHROPLASTY Left 01/26/2018   Procedure: LEFT TOTAL KNEE ARTHROPLASTY;   Surgeon: Latanya Maudlin, MD;  [facility];  Service: Orthopedics;  Laterality: Left;  . TUBAL LIGATION      There were no vitals filed for this visit.  Subjective Assessment - 11/23/18 1448    Subjective  Patient reported feeling sore after the weekend going up and down steps.     Pertinent History  Cervical fusion, stroke, osteopenia.    How long can you walk comfortably?  Can walk a community distance but painful.    Patient Stated Goals  Walk without pain and swelling.    Currently in Pain?  Yes    Pain Score  5     Pain Location  Knee    Pain Orientation  Left;Anterior    Pain Descriptors / Indicators  Sore;Tightness    Pain Type  Surgical pain    Pain Onset  More than a month ago    Pain Frequency  Constant         [facility] PT Assessment - 11/23/18 0001      Assessment   Medical Diagnosis  Left total knee replacement.    Referring Provider (PT)  Latanya Maudlin MD    Onset Date/Surgical Date  01/26/18  [facility] Adult PT Treatment/Exercise - 11/23/18 0001      Exercises   Exercises  Knee/Hip      Knee/Hip Exercises: Stretches   Passive Hamstring Stretch  Left;3 reps;30 seconds    Passive Hamstring Stretch Limitations  on 6" step    Knee: Self-Stretch to increase Flexion  Left;Other (comment)   x2 minutes     Knee/Hip Exercises: Aerobic   Nustep  Level 4 x 15 minutes seat from 8 to 7 to improve flexion      Knee/Hip Exercises: Standing   Lateral Step Up  Left;2 sets;10 reps;Hand Hold: 2;Step Height: 4"      Modalities   Modalities  Electrical Stimulation;Vasopneumatic      Electrical Stimulation   Electrical Stimulation Location  Left knee.    Electrical Stimulation Action  IFC    Electrical Stimulation Parameters  1-10 hz x15 mins    Electrical Stimulation Goals  Edema;Pain      Vasopneumatic   Number Minutes Vasopneumatic   15 minutes    Vasopnuematic Location   Knee    Vasopneumatic Pressure  Low    Vasopneumatic  Temperature   34      Manual Therapy   Manual Therapy  Passive ROM;Soft tissue mobilization    Passive ROM  PROM into left knee flexion and extension with gentle holds at end range to improve ROM. in sitting; PNF techniques                  PT Long Term Goals - 11/17/18 1003      PT LONG TERM GOAL #1   Title  I with HEP    Time  4    Period  Weeks    Status  On-going      PT LONG TERM GOAL #2   Title  Full active right knee extension in order to normalize gait.    Time  4    Period  Weeks    Status  On-going   3 degrees     PT LONG TERM GOAL #3   Title  Active right knee flexion to 115 degrees+ so the patient can perform functional tasks and do so with pain not > 2-3/10.    Time  4    Period  Weeks    Status  On-going   85 degrees     PT LONG TERM GOAL #4   Title  Walk a community distance with pain not > 3/10 and no increase swelling reported.    Time  4    Period  Weeks    Status  On-going   still with swelling but pain less than 3/10           Plan - 11/23/18 1454    Clinical Impression Statement  Patient was able to tolerate well but still sore from over the weekend. Patient was provided with knee flexion exercises as she continues to have difficulties with knee flexion based activities. Patient reported understanding. Normal response to modalities upon removal.     Clinical Presentation  Evolving    Clinical Decision Making  Low    Rehab Potential  Excellent    PT Frequency  2x / week    PT Duration  4 weeks    PT Treatment/Interventions  Electrical Stimulation;Therapeutic activities;Therapeutic exercise;Patient/family education;Vasopneumatic Device;Passive range of motion;Cryotherapy;Manual techniques    PT Next Visit Plan  Add leg press if tolerated. continue strengthening and ROM    Consulted and Agree with Plan  of Care  Patient       Patient will benefit from skilled therapeutic intervention in order to improve the following deficits and  impairments:  Pain, Decreased activity tolerance, Increased edema, Decreased range of motion  Visit Diagnosis: Chronic pain of left knee  Stiffness of left knee, not elsewhere classified     Problem List Patient Active Problem List   Diagnosis Date Noted  . Hx of total knee arthroplasty, left 01/26/2018  . Spinal stenosis of cervical region 09/27/2017  . Numbness and tingling in both hands 09/14/2016  . Insomnia 09/14/2016  . BMI 39.0-39.9,adult 09/14/2016  . Post concussive syndrome 04/30/2016  . Depression 11/28/2015  . Leg swelling 11/28/2015  . 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 Vitamin B 12 deficiency 11/28/2015  . Rosacea 11/28/2015    Gabriela Eves, PT, DPT 11/23/2018, 3:02 PM  [facility] [address] [place], [place], [address] Phone: [phone]   Fax:  [phone]  Name: VELLA COLQUITT MRN: [id number] Date of Birth: May 19, 1961

## 2018-11-24 ENCOUNTER — Ambulatory Visit: Admitting: Physical Therapy

## 2018-11-24 DIAGNOSIS — M25662 Stiffness of left knee, not elsewhere classified: Secondary | ICD-10-CM

## 2018-11-24 DIAGNOSIS — G8929 Other chronic pain: Secondary | ICD-10-CM

## 2018-11-24 DIAGNOSIS — M25562 Pain in left knee: Principal | ICD-10-CM

## 2018-11-24 NOTE — Therapy (Signed)
[facility] [place], [place], [address] Phone: [phone]   Fax:  [phone]  Physical Therapy Treatment  Patient Details  Name: Miranda Perkins MRN: [id number] Date of Birth: 18-Aug-1961 Referring Provider (PT): Latanya Maudlin MD   Encounter Date: 11/24/2018  PT End of Session - 11/24/18 1115    Visit Number  7    Number of Visits  8    Date for PT Re-Evaluation  12/01/18    PT Start Time  0948    PT Stop Time  1041    PT Time Calculation (min)  53 min    Activity Tolerance  Patient tolerated treatment well    Behavior During Therapy  [facility] for tasks assessed/performed       Past Medical History:  Diagnosis Date  . 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 Carpal tunnel syndrome    BILATERAL HANDS  . Depression   . Osteopenia 12/2017   T score -1.2 FRAX 4.9% / 0.3%  . Stroke ([place]) 1993   while on Chloramphenicol, no residuaL; [MEDICATION WAS GIVEN TO TREAT TICK BITE] ; patient states  "i stroked out right next to my doctor , he said i passed out and that was my only symptom" ;     Past Surgical History:  Procedure Laterality Date  . ANTERIOR CERVICAL DECOMP/DISCECTOMY FUSION N/A 09/27/2017   Procedure: ANTERIOR CERVICAL DECOMPRESSION/DISCECTOMY FUSION CERVICAL FOUR-FIVE ,CERVICAL FIVE-SIX,CERVICAL SIX-SEVEN;  Surgeon: Kary Kos, MD;  Location: [place];  Service: Neurosurgery;  Laterality: N/A;  . CARPAL TUNNEL RELEASE Left 09/27/2017   Procedure: CARPAL TUNNEL RELEASE;  Surgeon: Kary Kos, MD;  Location: [place];  Service: Neurosurgery;  Laterality: Left;  . FOOT SURGERY     bone removed from pinky toe  . HERNIA REPAIR    . KNEE SURGERY Left 06/21/2017   dr Rushie Nyhan; meniscus tear   . LEG SURGERY  1972  . MOUTH SURGERY    . skin cancer removal    . TONSILLECTOMY     1979?  . TOTAL KNEE ARTHROPLASTY Left 01/26/2018   Procedure: LEFT TOTAL KNEE ARTHROPLASTY;   Surgeon: Latanya Maudlin, MD;  [facility];  Service: Orthopedics;  Laterality: Left;  . TUBAL LIGATION      There were no vitals filed for this visit.  Subjective Assessment - 11/24/18 1112    Subjective  Dr. Gladstone Lighter was pleased with my progress.    Patient Stated Goals  Walk without pain and swelling.    Currently in Pain?  Yes    Pain Score  5     Pain Location  Knee    Pain Orientation  Left;Anterior    Pain Descriptors / Indicators  Sore;Tightness    Pain Type  Surgical pain    Pain Onset  More than a month ago                       [facility] Adult PT Treatment/Exercise - 11/24/18 0001      Exercises   Exercises  Knee/Hip      Knee/Hip Exercises: Aerobic   Recumbent Bike  5 minutes with attempts at backward revolutions.    Nustep  Level 5 x 15 minutes moving forward x 3 to increase left knee flexion.      Knee/Hip Exercises: Machines for Strengthening   Cybex Knee Extension  10# x 3 minutes including flexion  stretches.      Modalities   Modalities  Electrical Stimulation;Vasopneumatic             PT Education - 11/24/18 1116    Education Details  Seated 5# overpressure stretch to increase left knee extension.    Person(s) Educated  Patient    Methods  Explanation;Demonstration    Comprehension  Verbalized understanding          PT Long Term Goals - 11/17/18 1003      PT LONG TERM GOAL #1   Title  I with HEP    Time  4    Period  Weeks    Status  On-going      PT LONG TERM GOAL #2   Title  Full active right knee extension in order to normalize gait.    Time  4    Period  Weeks    Status  On-going   3 degrees     PT LONG TERM GOAL #3   Title  Active right knee flexion to 115 degrees+ so the patient can perform functional tasks and do so with pain not > 2-3/10.    Time  4    Period  Weeks    Status  On-going   85 degrees     PT LONG TERM GOAL #4   Title  Walk a community distance with pain not > 3/10 and no increase  swelling reported.    Time  4    Period  Weeks    Status  On-going   still with swelling but pain less than 3/10           Plan - 11/24/18 1118    Clinical Impression Statement  The patient did very well today and was almost able to make a backward revolution on the bike today.    PT Treatment/Interventions  Electrical Stimulation;Therapeutic activities;Therapeutic exercise;Patient/family education;Vasopneumatic Device;Passive range of motion;Cryotherapy;Manual techniques    PT Next Visit Plan  Add leg press if tolerated. continue strengthening and ROM       Patient will benefit from skilled therapeutic intervention in order to improve the following deficits and impairments:     Visit Diagnosis: Chronic pain of left knee  Stiffness of left knee, not elsewhere classified     Problem List Patient Active Problem List   Diagnosis Date Noted  . Hx of total knee arthroplasty, left 01/26/2018  . Spinal stenosis of cervical region 09/27/2017  . Numbness and tingling in both hands 09/14/2016  . Insomnia 09/14/2016  . BMI 39.0-39.9,adult 09/14/2016  . Post concussive syndrome 04/30/2016  . Depression 11/28/2015  . Leg swelling 11/28/2015  . 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 Vitamin B 12 deficiency 11/28/2015  . Rosacea 11/28/2015    Jeniffer Culliver, [place] MPT 11/24/2018, 11:20 AM  [facility] [address] [place], [place], [address] Phone: [phone]   Fax:  [phone]  Name: Miranda Perkins MRN: [id number] Date of Birth: 1961/01/01

## 2018-11-29 ENCOUNTER — Encounter: Payer: Self-pay | Admitting: Physical Therapy

## 2018-11-29 ENCOUNTER — Ambulatory Visit: Admitting: Physical Therapy

## 2018-11-29 DIAGNOSIS — G8929 Other chronic pain: Secondary | ICD-10-CM

## 2018-11-29 DIAGNOSIS — M25562 Pain in left knee: Secondary | ICD-10-CM | POA: Diagnosis not present

## 2018-11-29 NOTE — Therapy (Addendum)
[facility] [place], [place], [address] Phone: [phone]   Fax:  [phone]  Physical Therapy Treatment  Patient Details  Name: Miranda Perkins MRN: [id number] Date of Birth: Aug 08, 1961 Referring Provider (PT): Latanya Maudlin MD   Encounter Date: 11/29/2018  PT End of Session - 11/29/18 0958    Visit Number  8    Number of Visits  12    Date for PT Re-Evaluation  12/15/18    PT Start Time  0948    PT Stop Time  1041    PT Time Calculation (min)  53 min    Activity Tolerance  Patient tolerated treatment well    Behavior During Therapy  [facility] for tasks assessed/performed       Past Medical History:  Diagnosis Date  . Cancer ([place]) 2016   basal cell, forehead  . 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 Carpal tunnel syndrome    BILATERAL HANDS  . Depression   . Osteopenia 12/2017   T score -1.2 FRAX 4.9% / 0.3%  . Stroke ([place]) 1993   while on Chloramphenicol, no residuaL; [MEDICATION WAS GIVEN TO TREAT TICK BITE] ; patient states  "i stroked out right next to my doctor , he said i passed out and that was my only symptom" ;     Past Surgical History:  Procedure Laterality Date  . ANTERIOR CERVICAL DECOMP/DISCECTOMY FUSION N/A 09/27/2017   Procedure: ANTERIOR CERVICAL DECOMPRESSION/DISCECTOMY FUSION CERVICAL FOUR-FIVE ,CERVICAL FIVE-SIX,CERVICAL SIX-SEVEN;  Surgeon: Kary Kos, MD;  Location: [place];  Service: Neurosurgery;  Laterality: N/A;  . CARPAL TUNNEL RELEASE Left 09/27/2017   Procedure: CARPAL TUNNEL RELEASE;  Surgeon: Kary Kos, MD;  Location: [place];  Service: Neurosurgery;  Laterality: Left;  . FOOT SURGERY     bone removed from pinky toe  . HERNIA REPAIR    . KNEE SURGERY Left 06/21/2017   dr Rushie Nyhan; meniscus tear   . LEG SURGERY  1972  . MOUTH SURGERY    . skin cancer removal    . TONSILLECTOMY     1979?  . TOTAL KNEE ARTHROPLASTY Left 01/26/2018   Procedure: LEFT TOTAL KNEE ARTHROPLASTY;   Surgeon: Latanya Maudlin, MD;  [facility];  Service: Orthopedics;  Laterality: Left;  . TUBAL LIGATION      There were no vitals filed for this visit.  Subjective Assessment - 11/29/18 1029    Subjective  No new complaints.    Pertinent History  Cervical fusion, stroke, osteopenia.    How long can you walk comfortably?  Can walk a community distance but painful.    Patient Stated Goals  Walk without pain and swelling.    Currently in Pain?  Yes    Pain Score  5     Pain Orientation  Left;Anterior    Pain Onset  More than a month ago                       [facility] Adult PT Treatment/Exercise - 11/29/18 0001      Exercises   Exercises  Knee/Hip      Knee/Hip Exercises: Aerobic   Stationary Bike  10 minutes...nearly making a backward revolution.    Nustep  Level 5 x 10 minutes moving forward as tolerated.      Modalities   Modalities  Health visitor Stimulation Location  Left knee.  Manual Therapy   Manual Therapy  Passive ROM    Passive ROM  PROM x 3 minutes      IFC and vaso x 15 minutes to patient's left knee.            PT Long Term Goals - 11/17/18 1003      PT LONG TERM GOAL #1   Title  I with HEP    Time  4    Period  Weeks    Status  On-going      PT LONG TERM GOAL #2   Title  Full active right knee extension in order to normalize gait.    Time  4    Period  Weeks    Status  On-going   3 degrees     PT LONG TERM GOAL #3   Title  Active right knee flexion to 115 degrees+ so the patient can perform functional tasks and do so with pain not > 2-3/10.    Time  4    Period  Weeks    Status  On-going   85 degrees     PT LONG TERM GOAL #4   Title  Walk a community distance with pain not > 3/10 and no increase swelling reported.    Time  4    Period  Weeks    Status  On-going   still with swelling but pain less than 3/10             Patient will  benefit from skilled therapeutic intervention in order to improve the following deficits and impairments:     Visit Diagnosis: Chronic pain of left knee - Plan: PT plan of care cert/re-cert     Problem List Patient Active Problem List   Diagnosis Date Noted  . Hx of total knee arthroplasty, left 01/26/2018  . Spinal stenosis of cervical region 09/27/2017  . Numbness and tingling in both hands 09/14/2016  . Insomnia 09/14/2016  . BMI 39.0-39.9,adult 09/14/2016  . Post concussive syndrome 04/30/2016  . Depression 11/28/2015  . Leg swelling 11/28/2015  . 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 Vitamin B 12 deficiency 11/28/2015  . Rosacea 11/28/2015    APPLEGATE, [place] MPT 11/29/2018, 11:15 AM  [facility] [address] [place], [place], [address] Phone: [phone]   Fax:  [phone]  Name: Miranda Perkins MRN: [id number] Date of Birth: Jun 28, 1961

## 2018-12-01 ENCOUNTER — Ambulatory Visit: Admitting: Physical Therapy

## 2018-12-02 ENCOUNTER — Ambulatory Visit: Admitting: Physical Therapy

## 2018-12-02 DIAGNOSIS — M25562 Pain in left knee: Secondary | ICD-10-CM | POA: Diagnosis not present

## 2018-12-02 DIAGNOSIS — M25662 Stiffness of left knee, not elsewhere classified: Secondary | ICD-10-CM

## 2018-12-02 DIAGNOSIS — G8929 Other chronic pain: Secondary | ICD-10-CM

## 2018-12-02 NOTE — Therapy (Signed)
Miranda Perkins, [place], [address] Phone: [phone]   Fax:  [phone]  Physical Therapy Treatment  Patient Details  Name: Miranda Perkins MRN: [id number] Date of Birth: 10-Nov-1960 Referring Provider (PT): Latanya Maudlin MD   Encounter Date: 12/02/2018  PT End of Session - 12/02/18 1259    Visit Number  9    Number of Visits  12    Date for PT Re-Evaluation  12/15/18    PT Start Time  1036    PT Stop Time  1124    PT Time Calculation (min)  48 min    Activity Tolerance  Patient tolerated treatment well    Behavior During Therapy  [facility] for tasks assessed/performed       Past Medical History:  Diagnosis Date  . Cancer ([place]) 2016   basal cell, forehead  . 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 Carpal tunnel syndrome    BILATERAL HANDS  . Depression   . Osteopenia 12/2017   T score -1.2 FRAX 4.9% / 0.3%  . Stroke ([place]) 1993   while on Chloramphenicol, no residuaL; [MEDICATION WAS GIVEN TO TREAT TICK BITE] ; patient states  "i stroked out right next to my doctor , he said i passed out and that was my only symptom" ;     Past Surgical History:  Procedure Laterality Date  . ANTERIOR CERVICAL DECOMP/DISCECTOMY FUSION N/A 09/27/2017   Procedure: ANTERIOR CERVICAL DECOMPRESSION/DISCECTOMY FUSION CERVICAL FOUR-FIVE ,CERVICAL FIVE-SIX,CERVICAL SIX-SEVEN;  Surgeon: Kary Kos, MD;  Location: [place];  Service: Neurosurgery;  Laterality: N/A;  . CARPAL TUNNEL RELEASE Left 09/27/2017   Procedure: CARPAL TUNNEL RELEASE;  Surgeon: Kary Kos, MD;  Location: [place];  Service: Neurosurgery;  Laterality: Left;  . FOOT SURGERY     bone removed from pinky toe  . HERNIA REPAIR    . KNEE SURGERY Left 06/21/2017   dr Rushie Nyhan; meniscus tear   . LEG SURGERY  1972  . MOUTH SURGERY    . skin cancer removal    . TONSILLECTOMY     1979?  . TOTAL KNEE ARTHROPLASTY Left 01/26/2018   Procedure: LEFT TOTAL KNEE ARTHROPLASTY;   Surgeon: Latanya Maudlin, MD;  [facility];  Service: Orthopedics;  Laterality: Left;  . TUBAL LIGATION      There were no vitals filed for this visit.  Subjective Assessment - 12/02/18 1106    Subjective  I'm able to lead with my left leg going upstairs now.    Pertinent History  Cervical fusion, stroke, osteopenia.    How long can you walk comfortably?  Can walk a community distance but painful.    Patient Stated Goals  Walk without pain and swelling.    Currently in Pain?  Yes    Pain Score  4     Pain Orientation  Left;Anterior    Pain Type  Surgical pain    Pain Onset  More than a month ago         [facility] PT Assessment - 12/02/18 0001      PROM   Left Knee Flexion  105 degrees.                   [facility] Adult PT Treatment/Exercise - 12/02/18 0001      Exercises   Exercises  Knee/Hip      Knee/Hip Exercises: Aerobic   Stationary Bike  10 minutes.    Nustep  Level 5  x 10 minutes.      Modalities   Modalities  Health visitor Stimulation Location  Left knee.    Electrical Stimulation Action  IFC x 20 minutes.    Electrical Stimulation Goals  Edema;Pain      Vasopneumatic   Number Minutes Vasopneumatic   20 minutes    Vasopnuematic Location   --   Right knee.   Vasopneumatic Pressure  Low                  PT Long Term Goals - 11/17/18 1003      PT LONG TERM GOAL #1   Title  I with HEP    Time  4    Period  Weeks    Status  On-going      PT LONG TERM GOAL #2   Title  Full active right knee extension in order to normalize gait.    Time  4    Period  Weeks    Status  On-going   3 degrees     PT LONG TERM GOAL #3   Title  Active right knee flexion to 115 degrees+ so the patient can perform functional tasks and do so with pain not > 2-3/10.    Time  4    Period  Weeks    Status  On-going   85 degrees     PT LONG TERM GOAL #4   Title  Walk a community distance  with pain not > 3/10 and no increase swelling reported.    Time  4    Period  Weeks    Status  On-going   still with swelling but pain less than 3/10           Plan - 12/02/18 1303    Clinical Impression Statement  Patient made backward revolutions on bike today.  105 degrees of passive left knee flexion today.    PT Treatment/Interventions  Electrical Stimulation;Therapeutic activities;Therapeutic exercise;Patient/family education;Vasopneumatic Device;Passive range of motion;Cryotherapy;Manual techniques    PT Next Visit Plan  Add leg press if tolerated. continue strengthening and ROM    Consulted and Agree with Plan of Care  Patient       Patient will benefit from skilled therapeutic intervention in order to improve the following deficits and impairments:  Pain, Decreased activity tolerance, Increased edema, Decreased range of motion  Visit Diagnosis: Chronic pain of left knee  Stiffness of left knee, not elsewhere classified     Problem List Patient Active Problem List   Diagnosis Date Noted  . Hx of total knee arthroplasty, left 01/26/2018  . Spinal stenosis of cervical region 09/27/2017  . Numbness and tingling in both hands 09/14/2016  . Insomnia 09/14/2016  . BMI 39.0-39.9,adult 09/14/2016  . Post concussive syndrome 04/30/2016  . Depression 11/28/2015  . Leg swelling 11/28/2015  . 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 Vitamin B 12 deficiency 11/28/2015  . Rosacea 11/28/2015    Blue Winther, [place] MPT 12/02/2018, 1:06 PM  [facility] [address] [place], [place], [address] Phone: [phone]   Fax:  [phone]  Name: Miranda Perkins MRN: [id number] Date of Birth: 1961-08-18

## 2018-12-06 ENCOUNTER — Ambulatory Visit: Attending: Orthopedic Surgery | Admitting: Physical Therapy

## 2018-12-06 ENCOUNTER — Encounter: Payer: Self-pay | Admitting: Physical Therapy

## 2018-12-06 DIAGNOSIS — M5412 Radiculopathy, cervical region: Secondary | ICD-10-CM | POA: Diagnosis present

## 2018-12-06 DIAGNOSIS — M25662 Stiffness of left knee, not elsewhere classified: Secondary | ICD-10-CM | POA: Diagnosis present

## 2018-12-06 DIAGNOSIS — G8929 Other chronic pain: Secondary | ICD-10-CM | POA: Diagnosis present

## 2018-12-06 DIAGNOSIS — M25562 Pain in left knee: Secondary | ICD-10-CM | POA: Diagnosis not present

## 2018-12-06 NOTE — Therapy (Signed)
[facility] [place], [place], [address] Phone: [phone]   Fax:  [phone]  Physical Therapy Treatment  Patient Details  Name: Miranda Perkins MRN: [id number] Date of Birth: Jan 20, 1961 Referring Provider (PT): Latanya Maudlin MD   Encounter Date: 12/06/2018  PT End of Session - 12/06/18 1027    Visit Number  10    Number of Visits  12    Date for PT Re-Evaluation  12/15/18    PT Start Time  0951    PT Stop Time  1042    PT Time Calculation (min)  51 min    Activity Tolerance  Patient tolerated treatment well    Behavior During Therapy  [facility] for tasks assessed/performed       Past Medical History:  Diagnosis Date  . Cancer ([place]) 2016   basal cell, forehead  . 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 Carpal tunnel syndrome    BILATERAL HANDS  . Depression   . Osteopenia 12/2017   T score -1.2 FRAX 4.9% / 0.3%  . Stroke ([place]) 1993   while on Chloramphenicol, no residuaL; [MEDICATION WAS GIVEN TO TREAT TICK BITE] ; patient states  "i stroked out right next to my doctor , he said i passed out and that was my only symptom" ;     Past Surgical History:  Procedure Laterality Date  . ANTERIOR CERVICAL DECOMP/DISCECTOMY FUSION N/A 09/27/2017   Procedure: ANTERIOR CERVICAL DECOMPRESSION/DISCECTOMY FUSION CERVICAL FOUR-FIVE ,CERVICAL FIVE-SIX,CERVICAL SIX-SEVEN;  Surgeon: Kary Kos, MD;  Location: [place];  Service: Neurosurgery;  Laterality: N/A;  . CARPAL TUNNEL RELEASE Left 09/27/2017   Procedure: CARPAL TUNNEL RELEASE;  Surgeon: Kary Kos, MD;  Location: [place];  Service: Neurosurgery;  Laterality: Left;  . FOOT SURGERY     bone removed from pinky toe  . HERNIA REPAIR    . KNEE SURGERY Left 06/21/2017   dr Rushie Nyhan; meniscus tear   . LEG SURGERY  1972  . MOUTH SURGERY    . skin cancer removal    . TONSILLECTOMY     1979?  . TOTAL KNEE ARTHROPLASTY Left 01/26/2018   Procedure: LEFT TOTAL KNEE ARTHROPLASTY;   Surgeon: Latanya Maudlin, MD;  [facility];  Service: Orthopedics;  Laterality: Left;  . TUBAL LIGATION      There were no vitals filed for this visit.  Subjective Assessment - 12/06/18 1008    Subjective  Reports she is more sore in her left knee and feels like it might be due to the weather. She reports she was able to work out in the yard for an hour over the weeekend.    Pertinent History  Cervical fusion, stroke, osteopenia.    How long can you walk comfortably?  Can walk a community distance but painful.    Patient Stated Goals  Walk without pain and swelling.    Currently in Pain?  Yes    Pain Score  4     Pain Location  Knee    Pain Orientation  Left;Anterior    Pain Descriptors / Indicators  Tightness;Sore    Pain Type  Surgical pain    Pain Onset  More than a month ago    Pain Frequency  Constant         [facility] PT Assessment - 12/06/18 0001      Assessment   Medical Diagnosis  Left total knee replacement.    Referring Provider (PT)  Latanya Maudlin  MD    Onset Date/Surgical Date  01/26/18    Next MD Visit  May 2020                   [facility] Adult PT Treatment/Exercise - 12/06/18 0001      Exercises   Exercises  Knee/Hip      Knee/Hip Exercises: Aerobic   Stationary Bike  5 minutes AAROM    Nustep  Level 4 x15 mins      Knee/Hip Exercises: Machines for Strengthening   Cybex Knee Extension  10# x 3 minutes including flexion stretches.      Knee/Hip Exercises: Standing   Step Down  Left;2 sets;Hand Hold: 2;Step Height: 4"      Modalities   Modalities  Electrical Stimulation;Vasopneumatic      Electrical Stimulation   Electrical Stimulation Location  Left knee.    Electrical Stimulation Action  IFC    Electrical Stimulation Parameters  80-150 hz x15 mins    Electrical Stimulation Goals  Edema;Pain      Vasopneumatic   Number Minutes Vasopneumatic   15 minutes    Vasopnuematic Location   Knee    Vasopneumatic Pressure  Low    Vasopneumatic  Temperature   34                  PT Long Term Goals - 11/17/18 1003      PT LONG TERM GOAL #1   Title  I with HEP    Time  4    Period  Weeks    Status  On-going      PT LONG TERM GOAL #2   Title  Full active right knee extension in order to normalize gait.    Time  4    Period  Weeks    Status  On-going   3 degrees     PT LONG TERM GOAL #3   Title  Active right knee flexion to 115 degrees+ so the patient can perform functional tasks and do so with pain not > 2-3/10.    Time  4    Period  Weeks    Status  On-going   85 degrees     PT LONG TERM GOAL #4   Title  Walk a community distance with pain not > 3/10 and no increase swelling reported.    Time  4    Period  Weeks    Status  On-going   still with swelling but pain less than 3/10           Plan - 12/06/18 1028    Clinical Impression Statement  Patient was able to tolerate well despite pain at start of session. Patient was able to perform bike but was unable perform any revolutions due to increased stiffness. Patient required verbal cuing for proper form and technique for step downs. Patient was able to demonstrate with improved form after cuing. Normal response to modalities upon removal.     Clinical Presentation  Evolving    Clinical Decision Making  Low    Rehab Potential  Excellent    PT Frequency  2x / week    PT Duration  4 weeks    PT Treatment/Interventions  Electrical Stimulation;Therapeutic activities;Therapeutic exercise;Patient/family education;Vasopneumatic Device;Passive range of motion;Cryotherapy;Manual techniques    PT Next Visit Plan  Assess goals for progress note; Add leg press if tolerated. continue strengthening and ROM    Consulted and Agree with Plan of Care  Patient  Patient will benefit from skilled therapeutic intervention in order to improve the following deficits and impairments:  Pain, Decreased activity tolerance, Increased edema, Decreased range of  motion  Visit Diagnosis: Chronic pain of left knee  Stiffness of left knee, not elsewhere classified     Problem List Patient Active Problem List   Diagnosis Date Noted  . Hx of total knee arthroplasty, left 01/26/2018  . Spinal stenosis of cervical region 09/27/2017  . Numbness and tingling in both hands 09/14/2016  . Insomnia 09/14/2016  . BMI 39.0-39.9,adult 09/14/2016  . Post concussive syndrome 04/30/2016  . Depression 11/28/2015  . Leg swelling 11/28/2015  . 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 Vitamin B 12 deficiency 11/28/2015  . Rosacea 11/28/2015   Gabriela Eves, PT, DPT 12/06/2018, 11:14 AM  [facility] [place], [place], [address] Phone: [phone]   Fax:  [phone]  Name: MARIJAYNE RAUTH MRN: [id number] Date of Birth: [birth year]

## 2018-12-09 ENCOUNTER — Encounter: Payer: Self-pay | Admitting: Cardiology

## 2018-12-09 ENCOUNTER — Encounter: Admitting: Physical Therapy

## 2018-12-09 ENCOUNTER — Ambulatory Visit (INDEPENDENT_AMBULATORY_CARE_PROVIDER_SITE_OTHER): Admitting: Cardiology

## 2018-12-09 VITALS — BP 118/79 | HR 77 | Ht 65.0 in | Wt 248.0 lb

## 2018-12-09 DIAGNOSIS — Z79899 Other long term (current) drug therapy: Secondary | ICD-10-CM | POA: Diagnosis not present

## 2018-12-09 DIAGNOSIS — R0602 Shortness of breath: Secondary | ICD-10-CM | POA: Diagnosis not present

## 2018-12-09 DIAGNOSIS — R002 Palpitations: Secondary | ICD-10-CM

## 2018-12-09 DIAGNOSIS — R0789 Other chest pain: Secondary | ICD-10-CM | POA: Diagnosis not present

## 2018-12-09 MED ORDER — FUROSEMIDE 40 MG PO TABS: 40.0000 mg | ORAL_TABLET | Freq: Every day | ORAL | 3 refills | Status: DC

## 2018-12-09 NOTE — Patient Instructions (Addendum)
Medication Instructions:   Your physician has recommended you make the following change in your medication:   Stop hydrochlorothiazide  Start furosemide 40 mg by mouth daily  Continue all other medications the same  Labwork:  Your physician recommends that you return for lab work in: 2 weeks to check your BMET & Mg levels  Testing/Procedures:  NONE  Follow-Up:  Your physician recommends that you schedule a follow-up appointment in: 2 months.   Any Other Special Instructions Will Be Listed Below (If Applicable).  Please call our office on Tuesday or Wednesday next week to give [place] your weights.   If you need a refill on your cardiac medications before your next appointment, please call your pharmacy.

## 2018-12-09 NOTE — Progress Notes (Signed)
Clinical Summary Miranda Perkins is a 58 y.o.female seen as new consult, referred by Dr Evette Doffing for SOB and LE edema.     1. SOB - started about 4 months ago - DOE with walking from car to house.  - bilateral swelling legs. +orthopnea - no coughing, no wheezing. Former smoker 30 years.  - has been on HCTZ x 1 month without imprpvement - weight up 8 lbs since 08/2018  2. Occasional chest pain - occasional sharp pain left sided. Can occur at anytime. 7/10 in severity. Not positioanl, lasts few minutes, 3 times a month.    3. Palpitations - occurs once a week. Lasts over a minute - 1-2 cups coffee, then water only. No EtOH    4. History of knee replacement - currently in physical therapy - multiple recent surgeries over 2 years.  Past Medical History:  Diagnosis Date  . 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 Carpal tunnel syndrome    BILATERAL HANDS  . Depression   . Osteopenia 12/2017   T score -1.2 FRAX 4.9% / 0.3%  . Stroke ([place]) 1993   while on Chloramphenicol, no residuaL; [MEDICATION WAS GIVEN TO TREAT TICK BITE] ; patient states  "i stroked out right next to my doctor , he said i passed out and that was my only symptom" ;      Allergies  Allergen Reactions  . Influenza Vaccines Anaphylaxis and Other (See Comments)    Per patient  . Penicillins Anaphylaxis and Other (See Comments)    PATIENT HAS HAD A PCN REACTION WITH IMMEDIATE RASH, FACIAL/TONGUE/THROAT SWELLING, SOB, OR LIGHTHEADEDNESS WITH HYPOTENSION:  #  #  #  YES  #  #  #   Has patient had a PCN reaction causing severe rash involving mucus membranes or skin necrosis: No PATIENT HAS HAD A PCN REACTION THAT REQUIRED HOSPITALIZATION:  #  #  #  YES  #  #  #  Has patient had a PCN reaction occurring within the last 10 years: No   . Cortisone Other (See Comments)    Turned red and ran a low grade fever for 3 days     Current Outpatient Medications    Medication Sig Dispense Refill  . acetaminophen (TYLENOL) 500 MG tablet Take 1,000 mg by mouth daily as needed for moderate pain or headache.    . diphenhydrAMINE (BENADRYL) 25 MG tablet Take 25 mg by mouth every 6 (six) hours as needed.    . hydrochlorothiazide (HYDRODIURIL) 12.5 MG tablet Take 1 tablet (12.5 mg total) by mouth daily. 90 tablet 0  . ibuprofen (ADVIL,MOTRIN) 200 MG tablet Take 200 mg by mouth every 6 (six) hours as needed.     No current facility-administered medications for this visit.      Past Surgical History:  Procedure Laterality Date  . ANTERIOR CERVICAL DECOMP/DISCECTOMY FUSION N/A 09/27/2017   Procedure: ANTERIOR CERVICAL DECOMPRESSION/DISCECTOMY FUSION CERVICAL FOUR-FIVE ,CERVICAL FIVE-SIX,CERVICAL SIX-SEVEN;  Surgeon: Kary Kos, MD;  Location: [facility];  Service: Neurosurgery;  Laterality: N/A;  . CARPAL TUNNEL RELEASE Left 09/27/2017   Procedure: CARPAL TUNNEL RELEASE;  Surgeon: Kary Kos, MD;  Location: [facility];  Service: Neurosurgery;  Laterality: Left;  . FOOT SURGERY     bone removed from pinky toe  . HERNIA REPAIR    . KNEE SURGERY Left 06/21/2017   dr Rushie Nyhan; meniscus tear   . LEG  SURGERY  1972  . MOUTH SURGERY    . skin cancer removal    . TONSILLECTOMY     1979?  . TOTAL KNEE ARTHROPLASTY Left 01/26/2018   Procedure: LEFT TOTAL KNEE ARTHROPLASTY;  Surgeon: Latanya Maudlin, MD;  Location: [facility];  Service: Orthopedics;  Laterality: Left;  . TUBAL LIGATION       Allergies  Allergen Reactions  . Influenza Vaccines Anaphylaxis and Other (See Comments)    Per patient  . Penicillins Anaphylaxis and Other (See Comments)    PATIENT HAS HAD A PCN REACTION WITH IMMEDIATE RASH, FACIAL/TONGUE/THROAT SWELLING, SOB, OR LIGHTHEADEDNESS WITH HYPOTENSION:  #  #  #  YES  #  #  #   Has patient had a PCN reaction causing severe rash involving mucus membranes or skin necrosis: No PATIENT HAS HAD A PCN REACTION THAT REQUIRED HOSPITALIZATION:  #  #  #  YES  #  #   #  Has patient had a PCN reaction occurring within the last 10 years: No   . Cortisone Other (See Comments)    Turned red and ran a low grade fever for 3 days      Family History  Problem Relation Age of Onset  . Rheum arthritis Sister   . Clotting disorder Sister   . Rheum arthritis Sister   . Thyroid disease Sister   . Clotting disorder Maternal Aunt   . Clotting disorder Maternal Aunt   . Heart Problems Mother   . Rheum arthritis Mother   . Cancer Father        ? type  . Cancer Paternal Grandfather        Brain cancer     Social History Miranda Perkins reports that she quit smoking about 9 years ago. Her smoking use included cigarettes. She smoked 0.50 packs per day. She has never used smokeless tobacco. Miranda Perkins reports current alcohol use.   Review of Systems CONSTITUTIONAL: No weight loss, fever, chills, weakness or fatigue.  HEENT: Eyes: No visual loss, blurred vision, double vision or yellow sclerae.No hearing loss, sneezing, congestion, runny nose or sore throat.  SKIN: No rash or itching.  CARDIOVASCULAR: per hpi RESPIRATORY: No shortness of breath, cough or sputum.  GASTROINTESTINAL: No anorexia, nausea, vomiting or diarrhea. No abdominal pain or blood.  GENITOURINARY: No burning on urination, no polyuria NEUROLOGICAL: No headache, dizziness, syncope, paralysis, ataxia, numbness or tingling in the extremities. No change in bowel or bladder control.  MUSCULOSKELETAL: No muscle, back pain, joint pain or stiffness.  LYMPHATICS: No enlarged nodes. No history of splenectomy.  PSYCHIATRIC: No history of depression or anxiety.  ENDOCRINOLOGIC: No reports of sweating, cold or heat intolerance. No polyuria or polydipsia.  [name]   Physical Examination Vitals:   12/09/18 1437  BP: 118/79  Pulse: 77  SpO2: 97%   Vitals:   12/09/18 1437  Weight: 248 lb (112.5 kg)  Height: 5\' 5"  (1.651 m)    Gen: resting comfortably, no acute distress HEENT: no scleral icterus,  pupils equal round and reactive, no palptable cervical adenopathy,  CV: RRR, no m/r/g no jvd Resp: Clear to auscultation bilaterally GI: abdomen is soft, non-tender, non-distended, normal bowel sounds, no hepatosplenomegaly MSK: extremities are warm, no edema.  Skin: warm, no rash Neuro:  no focal deficits Psych: appropriate affect   Diagnostic Studies  07/2018 echo Study Conclusions  - Left ventricle: The cavity size was normal. Wall thickness was   increased in a pattern of mild LVH. Systolic  function was normal.   The estimated ejection fraction was in the range of 60% to 65%.   Wall motion was normal; there were no regional wall motion   abnormalities. Left ventricular diastolic function parameters   were normal. - Mitral valve: There was trivial regurgitation. - Right atrium: Central venous pressure (est): 3 mm Hg. - Atrial septum: No defect or patent foramen ovale was identified. - Tricuspid valve: There was trivial regurgitation. - Pulmonary arteries: Systolic pressure could not be accurately   estimated. - Pericardium, extracardiac: A prominent pericardial fat pad was   present, rule out trivial pericardial effusion.  Impressions:  - A prominent pericardial fat pad was present, rule out trivial   pericardial effusion. If concern remains about epicardial mass, a   chest CT would likely clarify this further.   Assessment and Plan  1. SOB - symptoms of DOE and orthopnea, as well as weight would suggest possible CHF. Exam limited by body habitus, no clear signs of fluid overload.  - echo 07/2018 with normal LV function and normal diastolic function. I reviewed the images and agree with the findings.  Perhaps HF with normal EF - d/c HCTZ, start lasix 40mg  daily. Check BMET/Mg in 2 weeks - she will udpate [place] on weights next eek  2,. Chest pain - atypical symptoms - if ongoing symptoms of pain and SOb with diuresis would consider stress testing  3. Palpitations -  mild and infrequent, monitor at this time  F/u 2 months  Arnoldo Lenis, M.D..

## 2018-12-13 ENCOUNTER — Encounter: Payer: Self-pay | Admitting: Physical Therapy

## 2018-12-13 ENCOUNTER — Telehealth: Payer: Self-pay | Admitting: Cardiology

## 2018-12-13 ENCOUNTER — Ambulatory Visit: Admitting: Physical Therapy

## 2018-12-13 DIAGNOSIS — M25562 Pain in left knee: Principal | ICD-10-CM

## 2018-12-13 DIAGNOSIS — M25662 Stiffness of left knee, not elsewhere classified: Secondary | ICD-10-CM

## 2018-12-13 DIAGNOSIS — G8929 Other chronic pain: Secondary | ICD-10-CM

## 2018-12-13 DIAGNOSIS — M5412 Radiculopathy, cervical region: Secondary | ICD-10-CM

## 2018-12-13 NOTE — Telephone Encounter (Signed)
Weight  12/10/2018   242.3 12/12/2018 224.6 12/13/2018 243.4   Over the weekend patient felt that she felt better and could see her ankles, but today she has felt like it was a chore to try to bend over and pick things up.  She has been very short winded  She wanted Dr Harl Bowie to know that she has not taken any [place] since her visit with him

## 2018-12-13 NOTE — Therapy (Signed)
[facility] [place], [place], [address] Phone: [phone]   Fax:  [phone]  Physical Therapy Treatment  Patient Details  Name: Miranda Perkins MRN: [id number] Date of Birth: Jan 28, 1961 Referring Provider (PT): Latanya Maudlin MD   Encounter Date: 12/13/2018  PT End of Session - 12/13/18 1228    Visit Number  11    Number of Visits  12    Date for PT Re-Evaluation  12/15/18    PT Start Time  6962   LATE ARRIVAL.   PT Stop Time  1211    PT Time Calculation (min)  45 min    Activity Tolerance  Patient tolerated treatment well    Behavior During Therapy  WFL for tasks assessed/performed       Past Medical History:  Diagnosis Date  . Cancer ([place]) 2016   basal cell, forehead  . 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 Carpal tunnel syndrome    BILATERAL HANDS  . Depression   . Osteopenia 12/2017   T score -1.2 FRAX 4.9% / 0.3%  . Stroke ([place]) 1993   while on Chloramphenicol, no residuaL; [MEDICATION WAS GIVEN TO TREAT TICK BITE] ; patient states  "i stroked out right next to my doctor , he said i passed out and that was my only symptom" ;     Past Surgical History:  Procedure Laterality Date  . ANTERIOR CERVICAL DECOMP/DISCECTOMY FUSION N/A 09/27/2017   Procedure: ANTERIOR CERVICAL DECOMPRESSION/DISCECTOMY FUSION CERVICAL FOUR-FIVE ,CERVICAL FIVE-SIX,CERVICAL SIX-SEVEN;  Surgeon: Kary Kos, MD;  Location: [facility];  Service: Neurosurgery;  Laterality: N/A;  . CARPAL TUNNEL RELEASE Left 09/27/2017   Procedure: CARPAL TUNNEL RELEASE;  Surgeon: Kary Kos, MD;  Location: [facility];  Service: Neurosurgery;  Laterality: Left;  . FOOT SURGERY     bone removed from pinky toe  . HERNIA REPAIR    . KNEE SURGERY Left 06/21/2017   dr Rushie Nyhan; meniscus tear   . LEG SURGERY  1972  . MOUTH SURGERY    . skin cancer removal    . TONSILLECTOMY     1979?  . TOTAL KNEE ARTHROPLASTY Left 01/26/2018   Procedure: LEFT TOTAL KNEE  ARTHROPLASTY;  Surgeon: Latanya Maudlin, MD;  [facility];  Service: Orthopedics;  Laterality: Left;  . TUBAL LIGATION      There were no vitals filed for this visit.  Subjective Assessment - 12/13/18 1230    Subjective  Doing better.    How long can you walk comfortably?  Can walk a community distance but painful.    Patient Stated Goals  Walk without pain and swelling.    Currently in Pain?  Yes    Pain Score  4     Pain Location  Knee    Pain Orientation  Left;Anterior    Pain Descriptors / Indicators  Tightness;Sore    Pain Type  Surgical pain    Pain Onset  More than a month ago                       [facility] Adult PT Treatment/Exercise - 12/13/18 0001      Exercises   Exercises  Knee/Hip      Knee/Hip Exercises: Aerobic   Recumbent Bike  15 minutes.      Knee/Hip Exercises: Machines for Strengthening   Cybex Knee Extension  10# x 5 minutes including flexion stretches.      Printmaker  Electrical Stimulation Location  Left knee.    Electrical Stimulation Action  IFC    Electrical Stimulation Parameters  80-150 Hz x 20 minutes.    Electrical Stimulation Goals  Edema;Pain      Vasopneumatic   Number Minutes Vasopneumatic   20 minutes    Vasopnuematic Location   --   Left knee.   Vasopneumatic Pressure  Medium                  PT Long Term Goals - 11/17/18 1003      PT LONG TERM GOAL #1   Title  I with HEP    Time  4    Period  Weeks    Status  On-going      PT LONG TERM GOAL #2   Title  Full active right knee extension in order to normalize gait.    Time  4    Period  Weeks    Status  On-going   3 degrees     PT LONG TERM GOAL #3   Title  Active right knee flexion to 115 degrees+ so the patient can perform functional tasks and do so with pain not > 2-3/10.    Time  4    Period  Weeks    Status  On-going   85 degrees     PT LONG TERM GOAL #4   Title  Walk a community distance with pain not > 3/10 and no  increase swelling reported.    Time  4    Period  Weeks    Status  On-going   still with swelling but pain less than 3/10           Plan - 12/13/18 1233    Clinical Impression Statement  Patient did well today.  Almost making full backward revolutions on bike without hip hiking.    PT Treatment/Interventions  Electrical Stimulation;Therapeutic activities;Therapeutic exercise;Patient/family education;Vasopneumatic Device;Passive range of motion;Cryotherapy;Manual techniques    PT Next Visit Plan  Assess goals for progress note; Add leg press if tolerated. continue strengthening and ROM    Consulted and Agree with Plan of Care  Patient       Patient will benefit from skilled therapeutic intervention in order to improve the following deficits and impairments:     Visit Diagnosis: Chronic pain of left knee  Stiffness of left knee, not elsewhere classified  Radiculopathy, cervical region     Problem List Patient Active Problem List   Diagnosis Date Noted  . Hx of total knee arthroplasty, left 01/26/2018  . Spinal stenosis of cervical region 09/27/2017  . Numbness and tingling in both hands 09/14/2016  . Insomnia 09/14/2016  . BMI 39.0-39.9,adult 09/14/2016  . Post concussive syndrome 04/30/2016  . Depression 11/28/2015  . Leg swelling 11/28/2015  . 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 Vitamin B 12 deficiency 11/28/2015  . Rosacea 11/28/2015    Batoul Limes, [place] MPT 12/13/2018, 12:34 PM  [facility] [address] [place], [place], [address] Phone: [phone]   Fax:  [phone]  Name: KNOX HOLDMAN MRN: [id number] Date of Birth: 03-24-1961

## 2018-12-14 NOTE — Telephone Encounter (Signed)
Not much weight loss, can she increase lasix to 60mg  daily. Update [place] early next week on weights   Zandra Abts MD

## 2018-12-14 NOTE — Telephone Encounter (Signed)
Pt voiced understanding - weight this morning was 239lbs - will update [place] next week

## 2018-12-16 ENCOUNTER — Ambulatory Visit: Admitting: Physical Therapy

## 2018-12-16 DIAGNOSIS — M5412 Radiculopathy, cervical region: Secondary | ICD-10-CM

## 2018-12-16 DIAGNOSIS — M25562 Pain in left knee: Principal | ICD-10-CM

## 2018-12-16 DIAGNOSIS — M25662 Stiffness of left knee, not elsewhere classified: Secondary | ICD-10-CM

## 2018-12-16 DIAGNOSIS — G8929 Other chronic pain: Secondary | ICD-10-CM

## 2018-12-16 NOTE — Therapy (Signed)
[facility] [place], [place], [address] Phone: [phone]   Fax:  [phone]  Physical Therapy Treatment  Patient Details  Name: Miranda Perkins MRN: [id number] Date of Birth: [birth year] Referring Provider (PT): Latanya Maudlin MD   Encounter Date: 12/16/2018  PT End of Session - 12/16/18 1331    Visit Number  12    Number of Visits  18    Date for PT Re-Evaluation  01/12/19    PT Start Time  7622    PT Stop Time  1124    PT Time Calculation (min)  52 min    Activity Tolerance  Patient tolerated treatment well    Behavior During Therapy  [facility] for tasks assessed/performed       Past Medical History:  Diagnosis Date  . Cancer ([place]) 2016   basal cell, forehead  . 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 Carpal tunnel syndrome    BILATERAL HANDS  . Depression   . Osteopenia 12/2017   T score -1.2 FRAX 4.9% / 0.3%  . Stroke ([place]) 1993   while on Chloramphenicol, no residuaL; [MEDICATION WAS GIVEN TO TREAT TICK BITE] ; patient states  "i stroked out right next to my doctor , he said i passed out and that was my only symptom" ;     Past Surgical History:  Procedure Laterality Date  . ANTERIOR CERVICAL DECOMP/DISCECTOMY FUSION N/A 09/27/2017   Procedure: ANTERIOR CERVICAL DECOMPRESSION/DISCECTOMY FUSION CERVICAL FOUR-FIVE ,CERVICAL FIVE-SIX,CERVICAL SIX-SEVEN;  Surgeon: Kary Kos, MD;  Location: [facility];  Service: Neurosurgery;  Laterality: N/A;  . CARPAL TUNNEL RELEASE Left 09/27/2017   Procedure: CARPAL TUNNEL RELEASE;  Surgeon: Kary Kos, MD;  Location: [facility];  Service: Neurosurgery;  Laterality: Left;  . FOOT SURGERY     bone removed from pinky toe  . HERNIA REPAIR    . KNEE SURGERY Left 06/21/2017   dr Rushie Nyhan; meniscus tear   . LEG SURGERY  1972  . MOUTH SURGERY    . skin cancer removal    . TONSILLECTOMY     1979?  . TOTAL KNEE ARTHROPLASTY Left 01/26/2018   Procedure: LEFT TOTAL KNEE ARTHROPLASTY;   Surgeon: Latanya Maudlin, MD;  [facility];  Service: Orthopedics;  Laterality: Left;  . TUBAL LIGATION      There were no vitals filed for this visit.  Subjective Assessment - 12/16/18 1332    Subjective  Doing better.    Pertinent History  Cervical fusion, stroke, osteopenia.    How long can you walk comfortably?  Can walk a community distance but painful.    Patient Stated Goals  Walk without pain and swelling.    Currently in Pain?  Yes    Pain Score  4     Pain Location  Knee    Pain Orientation  Left;Anterior    Pain Descriptors / Indicators  Tightness;Sore    Pain Type  Surgical pain    Pain Onset  More than a month ago                       [facility] Adult PT Treatment/Exercise - 12/16/18 0001      Exercises   Exercises  Knee/Hip      Knee/Hip Exercises: Aerobic   Nustep  Level 4 x 17 minutes moving seat forward as tolerated to increase knee flexion.      Knee/Hip Exercises: Machines for Strengthening   Cybex  Knee Extension  10# x 4 minutes    Cybex Leg Press  2 plates x 3 minutes.      Modalities   Modalities  Health visitor Stimulation Location  Left knee.    Electrical Stimulation Action  IFC    Electrical Stimulation Parameters  80-150 Hz x 20 minutes.    Electrical Stimulation Goals  Edema;Pain      Vasopneumatic   Number Minutes Vasopneumatic   20 minutes    Vasopnuematic Location   --   Right knee.   Vasopneumatic Pressure  Medium                  PT Long Term Goals - 11/17/18 1003      PT LONG TERM GOAL #1   Title  I with HEP    Time  4    Period  Weeks    Status  On-going      PT LONG TERM GOAL #2   Title  Full active right knee extension in order to normalize gait.    Time  4    Period  Weeks    Status  On-going   3 degrees     PT LONG TERM GOAL #3   Title  Active right knee flexion to 115 degrees+ so the patient can perform functional tasks and  do so with pain not > 2-3/10.    Time  4    Period  Weeks    Status  On-going   85 degrees     PT LONG TERM GOAL #4   Title  Walk a community distance with pain not > 3/10 and no increase swelling reported.    Time  4    Period  Weeks    Status  On-going   still with swelling but pain less than 3/10           Plan - 12/16/18 1336    Clinical Impression Statement  Patient is pleased with her progress.  Added leg press today without complaint.    PT Treatment/Interventions  Electrical Stimulation;Therapeutic activities;Therapeutic exercise;Patient/family education;Vasopneumatic Device;Passive range of motion;Cryotherapy;Manual techniques    PT Next Visit Plan  Assess goals for progress note; Add leg press if tolerated. continue strengthening and ROM    Consulted and Agree with Plan of Care  Patient       Patient will benefit from skilled therapeutic intervention in order to improve the following deficits and impairments:     Visit Diagnosis: Chronic pain of left knee - Plan: PT plan of care cert/re-cert  Stiffness of left knee, not elsewhere classified - Plan: PT plan of care cert/re-cert  Radiculopathy, cervical region - Plan: PT plan of care cert/re-cert     Problem List Patient Active Problem List   Diagnosis Date Noted  . Hx of total knee arthroplasty, left 01/26/2018  . Spinal stenosis of cervical region 09/27/2017  . Numbness and tingling in both hands 09/14/2016  . Insomnia 09/14/2016  . BMI 39.0-39.9,adult 09/14/2016  . Post concussive syndrome 04/30/2016  . Depression 11/28/2015  . Leg swelling 11/28/2015  . 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 Vitamin B 12 deficiency 11/28/2015  . Rosacea 11/28/2015    Esta Carmon, [place] MPT 12/16/2018, 1:41 PM  [facility] [address] [place], [place], [address] Phone: [phone]   Fax:  [phone]  Name: KAITYLN KALLSTROM MRN: [id number] Date of Birth:  10/16/1961

## 2018-12-20 ENCOUNTER — Ambulatory Visit: Admitting: Physical Therapy

## 2018-12-20 ENCOUNTER — Encounter: Payer: Self-pay | Admitting: Physical Therapy

## 2018-12-20 DIAGNOSIS — M25562 Pain in left knee: Secondary | ICD-10-CM | POA: Diagnosis not present

## 2018-12-20 DIAGNOSIS — G8929 Other chronic pain: Secondary | ICD-10-CM

## 2018-12-20 DIAGNOSIS — M25662 Stiffness of left knee, not elsewhere classified: Secondary | ICD-10-CM

## 2018-12-20 NOTE — Therapy (Signed)
.[facility] [place], [place], [address] Phone: [phone]   Fax:  [phone]  Physical Therapy Treatment  Patient Details  Name: Miranda Perkins MRN: [id number] Date of Birth: 1961/10/14 Referring Provider (PT): Latanya Maudlin MD   Encounter Date: 12/20/2018  PT End of Session - 12/20/18 1440    Visit Number  13    Number of Visits  18    Date for PT Re-Evaluation  01/12/19    PT Start Time  6962    PT Stop Time  1450    PT Time Calculation (min)  62 min    Activity Tolerance  Patient tolerated treatment well    Behavior During Therapy  [facility] for tasks assessed/performed       Past Medical History:  Diagnosis Date  . 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 Carpal tunnel syndrome    BILATERAL HANDS  . Depression   . Osteopenia 12/2017   T score -1.2 FRAX 4.9% / 0.3%  . Stroke ([place]) 1993   while on Chloramphenicol, no residuaL; [MEDICATION WAS GIVEN TO TREAT TICK BITE] ; patient states  "i stroked out right next to my doctor , he said i passed out and that was my only symptom" ;     Past Surgical History:  Procedure Laterality Date  . ANTERIOR CERVICAL DECOMP/DISCECTOMY FUSION N/A 09/27/2017   Procedure: ANTERIOR CERVICAL DECOMPRESSION/DISCECTOMY FUSION CERVICAL FOUR-FIVE ,CERVICAL FIVE-SIX,CERVICAL SIX-SEVEN;  Surgeon: Kary Kos, MD;  Location: [place];  Service: Neurosurgery;  Laterality: N/A;  . CARPAL TUNNEL RELEASE Left 09/27/2017   Procedure: CARPAL TUNNEL RELEASE;  Surgeon: Kary Kos, MD;  Location: [place];  Service: Neurosurgery;  Laterality: Left;  . FOOT SURGERY     bone removed from pinky toe  . HERNIA REPAIR    . KNEE SURGERY Left 06/21/2017   dr Rushie Nyhan; meniscus tear   . LEG SURGERY  1972  . MOUTH SURGERY    . skin cancer removal    . TONSILLECTOMY     1979?  . TOTAL KNEE ARTHROPLASTY Left 01/26/2018   Procedure: LEFT TOTAL KNEE ARTHROPLASTY;   Surgeon: Latanya Maudlin, MD;  [facility];  Service: Orthopedics;  Laterality: Left;  . TUBAL LIGATION      There were no vitals filed for this visit.  Subjective Assessment - 12/20/18 1401    Subjective  Reports feeling stiff in the left knee this afternoon.    Pertinent History  Cervical fusion, stroke, osteopenia.    How long can you walk comfortably?  Can walk a community distance but painful.    Patient Stated Goals  Walk without pain and swelling.    Currently in Pain?  Yes   did not report number on pain scale        [facility] PT Assessment - 12/20/18 0001      Assessment   Medical Diagnosis  Left total knee replacement.    Referring Provider (PT)  Latanya Maudlin MD    Onset Date/Surgical Date  01/26/18    Hand Dominance  Left    Next MD Visit  May 2020      PROM   Left Knee Extension  -3    Left Knee Flexion  94                   [facility] Adult PT Treatment/Exercise - 12/20/18 0001  Exercises   Exercises  Knee/Hip      Knee/Hip Exercises: Aerobic   Stationary Bike  5 minutes AAROM seat 10    Nustep  Level 4 x 10 minutes moving seat forward as tolerated to increase knee flexion.      Knee/Hip Exercises: Machines for Strengthening   Cybex Leg Press  1 plate eccentric control 3x10      Modalities   Modalities  Electrical Stimulation;Vasopneumatic      Electrical Stimulation   Electrical Stimulation Location  left knee    Electrical Stimulation Action  IFC    Electrical Stimulation Parameters  80-150 hz x15 mins    Electrical Stimulation Goals  Edema;Pain      Vasopneumatic   Number Minutes Vasopneumatic   15 minutes    Vasopnuematic Location   Knee    Vasopneumatic Pressure  Medium    Vasopneumatic Temperature   34      Manual Therapy   Manual Therapy  Passive ROM;Soft tissue mobilization    Soft tissue mobilization  IASTM to left superior scar as well as vastus lateralis    Passive ROM  PROM to left knee in supine to improve flexion  and extension                  PT Long Term Goals - 12/20/18 1437      PT LONG TERM GOAL #1   Title  I with HEP    Period  Weeks    Status  Achieved      PT LONG TERM GOAL #2   Title  Full active right knee extension in order to normalize gait.    Time  4    Period  Weeks    Status  On-going   3 degrees     PT LONG TERM GOAL #3   Title  Active right knee flexion to 115 degrees+ so the patient can perform functional tasks and do so with pain not > 2-3/10.    Time  4    Period  Weeks    Status  On-going   44     PT LONG TERM GOAL #4   Title  Walk a community distance with pain not > 3/10 and no increase swelling reported.    Time  4    Period  Weeks    Status  On-going            Plan - 12/20/18 1440    Clinical Impression Statement  Patient was able to tolerate treatment fairly well despite ongoing stiffness. Patient was able to tolerate IASTM well with a slight increase of ROM flexion after. Patient educated to continue HEP and not to be too discouraged at loss of PROM. Patient reported understanding. Continue IASTM and modalities.     Clinical Presentation  Evolving    Clinical Decision Making  Low    Rehab Potential  Excellent    PT Frequency  2x / week    PT Duration  4 weeks    PT Treatment/Interventions  Electrical Stimulation;Therapeutic activities;Therapeutic exercise;Patient/family education;Vasopneumatic Device;Passive range of motion;Cryotherapy;Manual techniques    PT Next Visit Plan  continue IASTM to quad tendon and superior aspect of her scar, PROM and modalities as needed.    Consulted and Agree with Plan of Care  Patient       Patient will benefit from skilled therapeutic intervention in order to improve the following deficits and impairments:  Pain, Decreased activity tolerance, Increased edema, Decreased range  of motion  Visit Diagnosis: Chronic pain of left knee  Stiffness of left knee, not elsewhere classified     Problem  List Patient Active Problem List   Diagnosis Date Noted  . Hx of total knee arthroplasty, left 01/26/2018  . Spinal stenosis of cervical region 09/27/2017  . Numbness and tingling in both hands 09/14/2016  . Insomnia 09/14/2016  . BMI 39.0-39.9,adult 09/14/2016  . Post concussive syndrome 04/30/2016  . Depression 11/28/2015  . Leg swelling 11/28/2015  . 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 Vitamin B 12 deficiency 11/28/2015  . Rosacea 11/28/2015   Gabriela Eves, PT, DPT 12/20/2018, 3:01 PM  [facility] [address] [place], [place], [address] Phone: [phone]   Fax:  [phone]  Name: Miranda Perkins MRN: [id number] Date of Birth: [birth year]

## 2018-12-22 ENCOUNTER — Telehealth: Payer: Self-pay | Admitting: Cardiology

## 2018-12-22 NOTE — Telephone Encounter (Signed)
Patient called to report her weight  239  12-22-2018 . Patient has a question about going for her lab work.

## 2018-12-22 NOTE — Telephone Encounter (Signed)
Swelling has improved but comes back after being up on feet. SOB and sleeping is much better. Patient asked if she could have lab work done on Monday if roads are bad tomorrow and was advised that it would be fine.

## 2018-12-23 ENCOUNTER — Encounter: Admitting: Physical Therapy

## 2018-12-23 ENCOUNTER — Other Ambulatory Visit (HOSPITAL_COMMUNITY)
Admission: RE | Admit: 2018-12-23 | Discharge: 2018-12-23 | Disposition: A | Discharge status: Home / Self Care | Source: Ambulatory Visit | Attending: Cardiology | Admitting: Cardiology

## 2018-12-23 DIAGNOSIS — Z79899 Other long term (current) drug therapy: Secondary | ICD-10-CM | POA: Diagnosis present

## 2018-12-23 DIAGNOSIS — R0602 Shortness of breath: Secondary | ICD-10-CM | POA: Insufficient documentation

## 2018-12-23 LAB — BASIC METABOLIC PANEL
Anion gap: 10 (ref 5–15)
BUN: 18 mg/dL (ref 6–20)
CO2: 24 mmol/L (ref 22–32)
Calcium: 9 mg/dL (ref 8.9–10.3)
Chloride: 99 mmol/L (ref 98–111)
Creatinine, Ser: 0.72 mg/dL (ref 0.44–1.00)
GFR calc Af Amer: >60 mL/min (ref >60)
GFR calc non Af Amer: >60 mL/min (ref >60)
Glucose, Bld: 91 mg/dL (ref 70–99)
Potassium: 3.6 mmol/L (ref 3.5–5.1)
Sodium: 133 mmol/L — ABNORMAL LOW (ref 135–145)

## 2018-12-23 NOTE — Telephone Encounter (Signed)
Yes, we will keep an eye out for the labs on Monday   Zandra Abts MD

## 2018-12-27 ENCOUNTER — Ambulatory Visit: Admitting: Physical Therapy

## 2018-12-27 ENCOUNTER — Encounter: Payer: Self-pay | Admitting: Physical Therapy

## 2018-12-27 DIAGNOSIS — M25562 Pain in left knee: Secondary | ICD-10-CM | POA: Diagnosis not present

## 2018-12-27 DIAGNOSIS — M25662 Stiffness of left knee, not elsewhere classified: Secondary | ICD-10-CM

## 2018-12-27 DIAGNOSIS — G8929 Other chronic pain: Secondary | ICD-10-CM

## 2018-12-27 NOTE — Therapy (Signed)
[facility] [place], [place], [address] Phone: [phone]   Fax:  [phone]  Physical Therapy Treatment  Patient Details  Name: Miranda Perkins MRN: [id number] Date of Birth: 01/13/1961 Referring Provider (PT): Latanya Maudlin MD   Encounter Date: 12/27/2018  PT End of Session - 12/27/18 1303    Visit Number  14    Number of Visits  18    Date for PT Re-Evaluation  01/12/19    PT Start Time  3662    PT Stop Time  1132    PT Time Calculation (min)  57 min    Activity Tolerance  Patient tolerated treatment well    Behavior During Therapy  [facility] for tasks assessed/performed       Past Medical History:  Diagnosis Date  . Cancer ([place]) 2016   basal cell, forehead  . 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 Carpal tunnel syndrome    BILATERAL HANDS  . Depression   . Osteopenia 12/2017   T score -1.2 FRAX 4.9% / 0.3%  . Stroke ([place]) 1993   while on Chloramphenicol, no residuaL; [MEDICATION WAS GIVEN TO TREAT TICK BITE] ; patient states  "i stroked out right next to my doctor , he said i passed out and that was my only symptom" ;     Past Surgical History:  Procedure Laterality Date  . ANTERIOR CERVICAL DECOMP/DISCECTOMY FUSION N/A 09/27/2017   Procedure: ANTERIOR CERVICAL DECOMPRESSION/DISCECTOMY FUSION CERVICAL FOUR-FIVE ,CERVICAL FIVE-SIX,CERVICAL SIX-SEVEN;  Surgeon: Kary Kos, MD;  Location: [place];  Service: Neurosurgery;  Laterality: N/A;  . CARPAL TUNNEL RELEASE Left 09/27/2017   Procedure: CARPAL TUNNEL RELEASE;  Surgeon: Kary Kos, MD;  Location: [place];  Service: Neurosurgery;  Laterality: Left;  . FOOT SURGERY     bone removed from pinky toe  . HERNIA REPAIR    . KNEE SURGERY Left 06/21/2017   dr Rushie Nyhan; meniscus tear   . LEG SURGERY  1972  . MOUTH SURGERY    . skin cancer removal    . TONSILLECTOMY     1979?  . TOTAL KNEE ARTHROPLASTY Left 01/26/2018   Procedure: LEFT TOTAL KNEE ARTHROPLASTY;   Surgeon: Latanya Maudlin, MD;  [facility];  Service: Orthopedics;  Laterality: Left;  . TUBAL LIGATION      There were no vitals filed for this visit.  Subjective Assessment - 12/27/18 1040    Subjective  Reports ongoing stiffness and states is unsure why.     Pertinent History  Cervical fusion, stroke, osteopenia.    How long can you walk comfortably?  Can walk a community distance but painful.    Patient Stated Goals  Walk without pain and swelling.    Currently in Pain?  Yes    Pain Score  2     Pain Orientation  Left;Anterior;Lateral    Pain Descriptors / Indicators  Tightness;Sore         [facility] PT Assessment - 12/27/18 0001      Assessment   Medical Diagnosis  Left total knee replacement.    Referring Provider (PT)  Latanya Maudlin MD    Onset Date/Surgical Date  01/26/18    Hand Dominance  Left    Next MD Visit  May 2020                   [facility] Adult PT Treatment/Exercise - 12/27/18 0001      Exercises   Exercises  Knee/Hip      Knee/Hip Exercises: Aerobic   Stationary Bike  10 minutes AAROM seat 9      Knee/Hip Exercises: Machines for Strengthening   Cybex Knee Extension  10# x10 single leg followed by knee flexion self stretch      Electrical Stimulation   Electrical Stimulation Location  left knee    Electrical Stimulation Action  IFC    Electrical Stimulation Parameters  80-150 hz x25 mins    Electrical Stimulation Goals  Edema;Pain      Vasopneumatic   Number Minutes Vasopneumatic   15 minutes    Vasopnuematic Location   Knee    Vasopneumatic Pressure  Low    Vasopneumatic Temperature   36      Manual Therapy   Manual Therapy  Passive ROM;Soft tissue mobilization    Soft tissue mobilization  IASTM to left superior scar as well as vastus lateralis, STW/M to medial hamstrings to decrease tightness.    Passive ROM  PROM knee flexion as well as hamstring stretch x1 minute                  PT Long Term Goals - 12/20/18 1437       PT LONG TERM GOAL #1   Title  I with HEP    Period  Weeks    Status  Achieved      PT LONG TERM GOAL #2   Title  Full active right knee extension in order to normalize gait.    Time  4    Period  Weeks    Status  On-going   3 degrees     PT LONG TERM GOAL #3   Title  Active right knee flexion to 115 degrees+ so the patient can perform functional tasks and do so with pain not > 2-3/10.    Time  4    Period  Weeks    Status  On-going   86     PT LONG TERM GOAL #4   Title  Walk a community distance with pain not > 3/10 and no increase swelling reported.    Time  4    Period  Weeks    Status  On-going            Plan - 12/27/18 1306    Clinical Impression Statement  Patient was able to tolerate treatment well with emphasis on IASTM and STW/M. Patient was able to tolerate IASTM but noted with increased sorness with STW/M to medial hamstrings. Patient noted with an increased stretch with PROM. Attempt STW/M to medial hamstrings next visit.     Clinical Presentation  Evolving    Clinical Decision Making  Low    Rehab Potential  Excellent    PT Frequency  2x / week    PT Duration  4 weeks    PT Treatment/Interventions  Electrical Stimulation;Therapeutic activities;Therapeutic exercise;Patient/family education;Vasopneumatic Device;Passive range of motion;Cryotherapy;Manual techniques    PT Next Visit Plan  continue IASTM to quad tendon and superior aspect of her scar, PROM and modalities as needed.    Consulted and Agree with Plan of Care  Patient       Patient will benefit from skilled therapeutic intervention in order to improve the following deficits and impairments:  Pain, Decreased activity tolerance, Increased edema, Decreased range of motion  Visit Diagnosis: Chronic pain of left knee  Stiffness of left knee, not elsewhere classified     Problem List Patient Active Problem List  Diagnosis Date Noted  . Hx of total knee arthroplasty, left 01/26/2018  .  Spinal stenosis of cervical region 09/27/2017  . Numbness and tingling in both hands 09/14/2016  . Insomnia 09/14/2016  . BMI 39.0-39.9,adult 09/14/2016  . Post concussive syndrome 04/30/2016  . Depression 11/28/2015  . Leg swelling 11/28/2015  . 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 Vitamin B 12 deficiency 11/28/2015  . Rosacea 11/28/2015   Gabriela Eves, PT, DPT 12/27/2018, 1:17 PM  [facility] [address] [place], [place], [address] Phone: [phone]   Fax:  [phone]  Name: Miranda Perkins MRN: [id number] Date of Birth: 1961/04/25

## 2018-12-30 ENCOUNTER — Ambulatory Visit: Admitting: Physical Therapy

## 2018-12-30 DIAGNOSIS — M25662 Stiffness of left knee, not elsewhere classified: Secondary | ICD-10-CM

## 2018-12-30 DIAGNOSIS — M25562 Pain in left knee: Secondary | ICD-10-CM | POA: Diagnosis not present

## 2018-12-30 DIAGNOSIS — G8929 Other chronic pain: Secondary | ICD-10-CM

## 2018-12-30 NOTE — Therapy (Signed)
[facility] [place], [place], [address] Phone: [phone]   Fax:  [phone]  Physical Therapy Treatment  Patient Details  Name: Miranda Perkins MRN: [id number] Date of Birth: 09-12-1961 Referring Provider (PT): Latanya Maudlin MD   Encounter Date: 12/30/2018  PT End of Session - 12/30/18 1202    Visit Number  15    Number of Visits  18    Date for PT Re-Evaluation  01/12/19    PT Start Time  6503    PT Stop Time  1125    PT Time Calculation (min)  50 min    Activity Tolerance  Patient tolerated treatment well    Behavior During Therapy  [facility] for tasks assessed/performed       Past Medical History:  Diagnosis Date  . Cancer ([place]) 2016   basal cell, forehead  . 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 Carpal tunnel syndrome    BILATERAL HANDS  . Depression   . Osteopenia 12/2017   T score -1.2 FRAX 4.9% / 0.3%  . Stroke ([place]) 1993   while on Chloramphenicol, no residuaL; [MEDICATION WAS GIVEN TO TREAT TICK BITE] ; patient states  "i stroked out right next to my doctor , he said i passed out and that was my only symptom" ;     Past Surgical History:  Procedure Laterality Date  . ANTERIOR CERVICAL DECOMP/DISCECTOMY FUSION N/A 09/27/2017   Procedure: ANTERIOR CERVICAL DECOMPRESSION/DISCECTOMY FUSION CERVICAL FOUR-FIVE ,CERVICAL FIVE-SIX,CERVICAL SIX-SEVEN;  Surgeon: Kary Kos, MD;  Location: [place];  Service: Neurosurgery;  Laterality: N/A;  . CARPAL TUNNEL RELEASE Left 09/27/2017   Procedure: CARPAL TUNNEL RELEASE;  Surgeon: Kary Kos, MD;  Location: [place];  Service: Neurosurgery;  Laterality: Left;  . FOOT SURGERY     bone removed from pinky toe  . HERNIA REPAIR    . KNEE SURGERY Left 06/21/2017   dr Rushie Nyhan; meniscus tear   . LEG SURGERY  1972  . MOUTH SURGERY    . skin cancer removal    . TONSILLECTOMY     1979?  . TOTAL KNEE ARTHROPLASTY Left 01/26/2018   Procedure: LEFT TOTAL KNEE ARTHROPLASTY;   Surgeon: Latanya Maudlin, MD;  [facility];  Service: Orthopedics;  Laterality: Left;  . TUBAL LIGATION      There were no vitals filed for this visit.  Subjective Assessment - 12/30/18 1155    Subjective  Knee feels stiff.    Currently in Pain?  Yes    Pain Score  2     Pain Orientation  Left    Pain Descriptors / Indicators  --   'Stiff".   Pain Type  Surgical pain    Pain Onset  More than a month ago                       [facility] Adult PT Treatment/Exercise - 12/30/18 0001      Exercises   Exercises  Knee/Hip      Knee/Hip Exercises: Aerobic   Recumbent Bike  10 minutes.    Nustep  Level 4 x 10 minutes.      Knee/Hip Exercises: Machines for Strengthening   Cybex Knee Extension  10# x 3 minutes.      Modalities   Modalities  Health visitor Stimulation Location  left knee.    Electrical Stimulation Action  IFC  Electrical Stimulation Parameters  80-150 Hz x 15 minutes.    Electrical Stimulation Goals  Edema;Pain      Vasopneumatic   Number Minutes Vasopneumatic   15 minutes    Vasopnuematic Location   --   Left knee.   Vasopneumatic Pressure  Low      Manual Therapy   Manual Therapy  Joint mobilization    Joint Mobilization  Left patellar mobs x 3 minutes f/b instruct in how patient can perform at home.                  PT Long Term Goals - 12/20/18 1437      PT LONG TERM GOAL #1   Title  I with HEP    Period  Weeks    Status  Achieved      PT LONG TERM GOAL #2   Title  Full active right knee extension in order to normalize gait.    Time  4    Period  Weeks    Status  On-going   3 degrees     PT LONG TERM GOAL #3   Title  Active right knee flexion to 115 degrees+ so the patient can perform functional tasks and do so with pain not > 2-3/10.    Time  4    Period  Weeks    Status  On-going   45     PT LONG TERM GOAL #4   Title  Walk a community distance  with pain not > 3/10 and no increase swelling reported.    Time  4    Period  Weeks    Status  On-going            Plan - 12/30/18 1200    Clinical Impression Statement  Patient's knee stiffer today.  She was found to have decreased left patellar mobility today that responded well to mobs.  She was instructed to perform at home as well.    PT Treatment/Interventions  Electrical Stimulation;Therapeutic activities;Therapeutic exercise;Patient/family education;Vasopneumatic Device;Passive range of motion;Cryotherapy;Manual techniques    PT Next Visit Plan  continue IASTM to quad tendon and superior aspect of her scar, PROM and modalities as needed.    Consulted and Agree with Plan of Care  Patient       Patient will benefit from skilled therapeutic intervention in order to improve the following deficits and impairments:  Pain, Decreased activity tolerance, Increased edema, Decreased range of motion  Visit Diagnosis: Chronic pain of left knee  Stiffness of left knee, not elsewhere classified     Problem List Patient Active Problem List   Diagnosis Date Noted  . Hx of total knee arthroplasty, left 01/26/2018  . Spinal stenosis of cervical region 09/27/2017  . Numbness and tingling in both hands 09/14/2016  . Insomnia 09/14/2016  . BMI 39.0-39.9,adult 09/14/2016  . Post concussive syndrome 04/30/2016  . Depression 11/28/2015  . Leg swelling 11/28/2015  . 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 Vitamin B 12 deficiency 11/28/2015  . Rosacea 11/28/2015    Mirl Hillery, [place] MPT 12/30/2018, 12:03 PM  [facility] [address] [place], [place], [address] Phone: [phone]   Fax:  [phone]  Name: Miranda Perkins MRN: [id number] Date of Birth: 01-31-1961

## 2019-01-04 ENCOUNTER — Ambulatory Visit: Admitting: Physical Therapy

## 2019-01-06 ENCOUNTER — Ambulatory Visit: Attending: Orthopedic Surgery | Admitting: Physical Therapy

## 2019-01-06 ENCOUNTER — Encounter: Payer: Self-pay | Admitting: Physical Therapy

## 2019-01-06 DIAGNOSIS — M25562 Pain in left knee: Secondary | ICD-10-CM | POA: Insufficient documentation

## 2019-01-06 DIAGNOSIS — M25662 Stiffness of left knee, not elsewhere classified: Secondary | ICD-10-CM | POA: Insufficient documentation

## 2019-01-06 DIAGNOSIS — G8929 Other chronic pain: Secondary | ICD-10-CM | POA: Diagnosis present

## 2019-01-06 NOTE — Therapy (Signed)
[facility] [place], [place], [address] Phone: [phone]   Fax:  [phone]  Physical Therapy Treatment  Patient Details  Name: Miranda Perkins MRN: [id number] Date of Birth: 1960-12-23 Referring Provider (PT): Miranda Maudlin MD   Encounter Date: 01/06/2019  PT End of Session - 01/06/19 1000    Visit Number  16    Number of Visits  18    Date for PT Re-Evaluation  01/12/19    PT Start Time  0951    PT Stop Time  1050    PT Time Calculation (min)  59 min    Activity Tolerance  Patient tolerated treatment well    Behavior During Therapy  [facility] for tasks assessed/performed       Past Medical History:  Diagnosis Date  . 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 Carpal tunnel syndrome    BILATERAL HANDS  . Depression   . Osteopenia 12/2017   T score -1.2 FRAX 4.9% / 0.3%  . Stroke ([place]) 1993   while on Chloramphenicol, no residuaL; [MEDICATION WAS GIVEN TO TREAT TICK BITE] ; patient states  "i stroked out right next to my doctor , he said i passed out and that was my only symptom" ;     Past Surgical History:  Procedure Laterality Date  . ANTERIOR CERVICAL DECOMP/DISCECTOMY FUSION N/A 09/27/2017   Procedure: ANTERIOR CERVICAL DECOMPRESSION/DISCECTOMY FUSION CERVICAL FOUR-FIVE ,CERVICAL FIVE-SIX,CERVICAL SIX-SEVEN;  Surgeon: Miranda Kos, MD;  Location: [place];  Service: Neurosurgery;  Laterality: N/A;  . CARPAL TUNNEL RELEASE Left 09/27/2017   Procedure: CARPAL TUNNEL RELEASE;  Surgeon: Miranda Kos, MD;  Location: [place];  Service: Neurosurgery;  Laterality: Left;  . FOOT SURGERY     bone removed from pinky toe  . HERNIA REPAIR    . KNEE SURGERY Left 06/21/2017   dr Miranda Perkins; meniscus tear   . LEG SURGERY  1972  . MOUTH SURGERY    . skin cancer removal    . TONSILLECTOMY     1979?  . TOTAL KNEE ARTHROPLASTY Left 01/26/2018   Procedure: LEFT TOTAL KNEE ARTHROPLASTY;   Surgeon: Miranda Maudlin, MD;  Location: [facility];  Service: Orthopedics;  Laterality: Left;  . TUBAL LIGATION      There were no vitals filed for this visit.  Subjective Assessment - 01/06/19 0959    Subjective  Reports feeling stiff    Pertinent History  Cervical fusion, stroke, osteopenia.    How long can you walk comfortably?  Can walk a community distance but painful.    Patient Stated Goals  Walk without pain and swelling.    Currently in Pain?  Yes    Pain Score  2     Pain Location  Knee    Pain Orientation  Left    Pain Descriptors / Indicators  Tightness    Pain Type  Surgical pain    Pain Onset  More than a month ago    Pain Frequency  Constant         [facility] PT Assessment - 01/06/19 0001      Assessment   Medical Diagnosis  Left total knee replacement.    Referring Provider (PT)  Miranda Maudlin MD    Onset Date/Surgical Date  01/26/18    Hand Dominance  Left    Next MD Visit  May 2020      AROM  Left Knee Flexion  80                   [facility] Adult PT Treatment/Exercise - 01/06/19 0001      Knee/Hip Exercises: Aerobic   Nustep  Level 4 x 58minutes.      Modalities   Modalities  Psychologist, educational Location  left knee    Electrical Stimulation Action  IFC    Electrical Stimulation Parameters  80-150 hz x15 mins    Electrical Stimulation Goals  Edema;Pain      Vasopneumatic   Number Minutes Vasopneumatic   15 minutes    Vasopnuematic Location   Knee    Vasopneumatic Pressure  Low    Vasopneumatic Temperature   36      Manual Therapy   Manual Therapy  Joint mobilization    Joint Mobilization  Left patellar mobs to improve ROM    Passive ROM  PROM to left knee                  PT Long Term Goals - 12/20/18 1437      PT LONG TERM GOAL #1   Title  I with HEP    Period  Weeks    Status  Achieved      PT LONG TERM GOAL #2   Title  Full active right knee  extension in order to normalize gait.    Time  4    Period  Weeks    Status  On-going   3 degrees     PT LONG TERM GOAL #3   Title  Active right knee flexion to 115 degrees+ so the patient can perform functional tasks and do so with pain not > 2-3/10.    Time  4    Period  Weeks    Status  On-going   21     PT LONG TERM GOAL #4   Title  Walk a community distance with pain not > 3/10 and no increase swelling reported.    Time  4    Period  Weeks    Status  On-going            Plan - 01/06/19 1239    Clinical Impression Statement  Patient was tolerate treatment fairly. Patient noted with increased edema and stiffness in left knee. Patellar mobility performed and gentle ROM but patient noted with increased pain. Patient instructed to follow up with cardiologist as well as surgeon as she has been having increased difficulties with ROM, walking and ADLs. Patient reported understanding. Normal response to modalities upon removal.    Stability/Clinical Decision Making  Evolving/Moderate complexity    Clinical Decision Making  Low    Rehab Potential  Excellent    PT Frequency  2x / week    PT Duration  4 weeks    PT Treatment/Interventions  Electrical Stimulation;Therapeutic activities;Therapeutic exercise;Patient/family education;Vasopneumatic Device;Passive range of motion;Cryotherapy;Manual techniques    PT Next Visit Plan  continue IASTM to quad tendon and superior aspect of her scar, PROM and modalities as needed.    Consulted and Agree with Plan of Care  Patient       Patient will benefit from skilled therapeutic intervention in order to improve the following deficits and impairments:  Pain, Decreased activity tolerance, Increased edema, Decreased range of motion  Visit Diagnosis: Chronic pain of left knee  Stiffness of left knee, not elsewhere classified  Problem List Patient Active Problem List   Diagnosis Date Noted  . Hx of total knee arthroplasty, left  01/26/2018  . Spinal stenosis of cervical region 09/27/2017  . Numbness and tingling in both hands 09/14/2016  . Insomnia 09/14/2016  . BMI 39.0-39.9,adult 09/14/2016  . Post concussive syndrome 04/30/2016  . Depression 11/28/2015  . Leg swelling 11/28/2015  . 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 Vitamin B 12 deficiency 11/28/2015  . Rosacea 11/28/2015   Miranda Perkins, PT, DPT 01/06/2019, 12:42 PM  [facility] [place], [place], [address] Phone: [phone]   Fax:  [phone]  Name: Miranda Perkins MRN: [id number] Date of Birth: 1961-02-12

## 2019-01-10 ENCOUNTER — Ambulatory Visit: Admitting: Physical Therapy

## 2019-01-10 DIAGNOSIS — G8929 Other chronic pain: Secondary | ICD-10-CM

## 2019-01-10 DIAGNOSIS — M25562 Pain in left knee: Secondary | ICD-10-CM | POA: Diagnosis not present

## 2019-01-10 DIAGNOSIS — M25662 Stiffness of left knee, not elsewhere classified: Secondary | ICD-10-CM

## 2019-01-10 NOTE — Therapy (Addendum)
[facility] [place], [place], [address] Phone: [phone]   Fax:  [phone]  Physical Therapy Treatment PHYSICAL THERAPY DISCHARGE SUMMARY  Visits from Start of Care: 17  Current functional level related to goals / functional outcomes: See below   Remaining deficits: See goals   Education / Equipment: HEP Plan: Patient agrees to discharge.  Patient goals were partially met. Patient is being discharged due to not returning since the last visit.  ?????    Gabriela Eves, PT, DPT 10/02/19   Patient Details  Name: Miranda Perkins MRN: [id number] Date of Birth: [birth year] Referring Provider (PT): Latanya Maudlin MD   Encounter Date: 01/10/2019  PT End of Session - 01/10/19 1300    Visit Number  17    Number of Visits  18    Date for PT Re-Evaluation  01/12/19    PT Start Time  1117    PT Stop Time  1213    PT Time Calculation (min)  56 min    Activity Tolerance  Patient tolerated treatment well    Behavior During Therapy  [facility] for tasks assessed/performed       Past Medical History:  Diagnosis Date  . Cancer ([place]) 2016   basal cell, forehead  . 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 Carpal tunnel syndrome    BILATERAL HANDS  . Depression   . Osteopenia 12/2017   T score -1.2 FRAX 4.9% / 0.3%  . Stroke ([place]) 1993   while on Chloramphenicol, no residuaL; [MEDICATION WAS GIVEN TO TREAT TICK BITE] ; patient states  "i stroked out right next to my doctor , he said i passed out and that was my only symptom" ;     Past Surgical History:  Procedure Laterality Date  . ANTERIOR CERVICAL DECOMP/DISCECTOMY FUSION N/A 09/27/2017   Procedure: ANTERIOR CERVICAL DECOMPRESSION/DISCECTOMY FUSION CERVICAL FOUR-FIVE ,CERVICAL FIVE-SIX,CERVICAL SIX-SEVEN;  Surgeon: Kary Kos, MD;  Location: [place];  Service: Neurosurgery;  Laterality: N/A;  . CARPAL TUNNEL RELEASE Left 09/27/2017   Procedure: CARPAL TUNNEL RELEASE;   Surgeon: Kary Kos, MD;  Location: [place];  Service: Neurosurgery;  Laterality: Left;  . FOOT SURGERY     bone removed from pinky toe  . HERNIA REPAIR    . KNEE SURGERY Left 06/21/2017   dr Rushie Nyhan; meniscus tear   . LEG SURGERY  1972  . MOUTH SURGERY    . skin cancer removal    . TONSILLECTOMY     1979?  . TOTAL KNEE ARTHROPLASTY Left 01/26/2018   Procedure: LEFT TOTAL KNEE ARTHROPLASTY;  Surgeon: Latanya Maudlin, MD;  Location: [facility];  Service: Orthopedics;  Laterality: Left;  . TUBAL LIGATION      There were no vitals filed for this visit.  Subjective Assessment - 01/10/19 1203    Subjective  Patient reported feeling stiff with increased swelling    Pertinent History  Cervical fusion, stroke, osteopenia.    How long can you walk comfortably?  Can walk a community distance but painful.    Patient Stated Goals  Walk without pain and swelling.    Currently in Pain?  Yes    Pain Score  3     Pain Location  Knee    Pain Orientation  Left    Pain Descriptors / Indicators  Tightness;Sore    Pain Type  Surgical pain    Pain Onset  More than a month ago  [facility] PT Assessment - 01/10/19 0001      Assessment   Medical Diagnosis  Left total knee replacement.    Referring Provider (PT)  Latanya Maudlin MD    Onset Date/Surgical Date  01/26/18    Hand Dominance  Left    Next MD Visit  April 2020      Circumferential Edema   Circumferential - Right  46 cm at mid patella    Circumferential - Left   48.5 cm at mid patwlla      AROM   Left Knee Extension  -3    Left Knee Flexion  83                   [facility] Adult PT Treatment/Exercise - 01/10/19 0001      Knee/Hip Exercises: Aerobic   Nustep  Level 4 x 15 minutes.      Modalities   Modalities  Psychologist, educational Location  left knee    Electrical Stimulation Action  IFC    Electrical Stimulation Parameters  1-10 hz x15 mins     Electrical Stimulation Goals  Edema;Pain      Vasopneumatic   Number Minutes Vasopneumatic   15 minutes    Vasopnuematic Location   Knee    Vasopneumatic Pressure  Low    Vasopneumatic Temperature   36      Manual Therapy   Manual Therapy  Joint mobilization    Manual therapy comments  edema massage to reduce swelling    Joint Mobilization  Left patellar mobs to improve ROM    Passive ROM  PROM to left knee to improve ROM                  PT Long Term Goals - 01/10/19 1202      PT LONG TERM GOAL #1   Title  I with HEP    Time  4    Period  Weeks    Status  Achieved      PT LONG TERM GOAL #2   Title  Full active right knee extension in order to normalize gait.    Time  4    Period  Weeks    Status  Not Met      PT LONG TERM GOAL #3   Title  Active right knee flexion to 115 degrees+ so the patient can perform functional tasks and do so with pain not > 2-3/10.    Time  4    Period  Weeks    Status  Not Met      PT LONG TERM GOAL #4   Title  Walk a community distance with pain not > 3/10 and no increase swelling reported.    Time  4    Period  Weeks    Status  Not Met            Plan - 01/10/19 1248    Clinical Impression Statement  Patient arrived feeling discouraged due regression of PROM. Patient also has noted with ongoing swelling that has progressively worsened. Patient is currenly seeing a cardiologist and is on lasix but reports swelling has not made significant improvements.  At most, patient's left knee PROM was 3-105. Patient and PTs have discussed placing on hold due to lack of progress as well as ongoing pain and swelling. See objective measurements for AROM and edema measurements. Patient to see PCP next week and  surgeon in 4 weeks. No adverse affects noted upon removal.     Stability/Clinical Decision Making  Evolving/Moderate complexity    Clinical Decision Making  Low    Rehab Potential  Excellent    PT Frequency  2x / week    PT Duration   4 weeks    PT Treatment/Interventions  Electrical Stimulation;Therapeutic activities;Therapeutic exercise;Patient/family education;Vasopneumatic Device;Passive range of motion;Cryotherapy;Manual techniques    PT Next Visit Plan  Hold until MD.     Consulted and Agree with Plan of Care  Patient       Patient will benefit from skilled therapeutic intervention in order to improve the following deficits and impairments:  Pain, Decreased activity tolerance, Increased edema, Decreased range of motion  Visit Diagnosis: Chronic pain of left knee  Stiffness of left knee, not elsewhere classified     Problem List Patient Active Problem List   Diagnosis Date Noted  . Hx of total knee arthroplasty, left 01/26/2018  . Spinal stenosis of cervical region 09/27/2017  . Numbness and tingling in both hands 09/14/2016  . Insomnia 09/14/2016  . BMI 39.0-39.9,adult 09/14/2016  . Post concussive syndrome 04/30/2016  . Depression 11/28/2015  . Leg swelling 11/28/2015  . 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 Vitamin B 12 deficiency 11/28/2015  . Rosacea 11/28/2015   Gabriela Eves, PT, DPT 01/10/2019, 1:09 PM  [facility] [place], [place], [address] Phone: [phone]   Fax:  [phone]  Name: Miranda Perkins MRN: [id number] Date of Birth: 09/08/1961

## 2019-01-13 ENCOUNTER — Other Ambulatory Visit: Payer: Self-pay

## 2019-01-13 ENCOUNTER — Ambulatory Visit (INDEPENDENT_AMBULATORY_CARE_PROVIDER_SITE_OTHER): Admitting: Family Medicine

## 2019-01-13 ENCOUNTER — Encounter: Payer: Self-pay | Admitting: Family Medicine

## 2019-01-13 ENCOUNTER — Ambulatory Visit: Admitting: Physical Therapy

## 2019-01-13 VITALS — BP 132/85 | HR 86 | Temp 98.7°F | Ht 65.0 in | Wt 247.0 lb

## 2019-01-13 DIAGNOSIS — J208 Acute bronchitis due to other specified organisms: Secondary | ICD-10-CM | POA: Diagnosis not present

## 2019-01-13 MED ORDER — ALBUTEROL SULFATE (2.5 MG/3ML) 0.083% IN NEBU: 2.5000 mg | INHALATION_SOLUTION | Freq: Four times a day (QID) | RESPIRATORY_TRACT | 1 refills | Status: DC | PRN

## 2019-01-13 MED ORDER — BENZONATATE 100 MG PO CAPS: 100.0000 mg | ORAL_CAPSULE | Freq: Three times a day (TID) | ORAL | 0 refills | Status: DC | PRN

## 2019-01-13 MED ORDER — PREDNISONE 20 MG PO TABS: ORAL_TABLET | ORAL | 0 refills | Status: DC

## 2019-01-13 NOTE — Patient Instructions (Signed)
Acute Bronchitis, Adult Acute bronchitis is when air tubes (bronchi) in the lungs suddenly get swollen. The condition can make it hard to breathe. It can also cause these symptoms:  A cough.  Coughing up clear, yellow, or green mucus.  Wheezing.  Chest congestion.  Shortness of breath.  A fever.  Body aches.  Chills.  A sore throat. Follow these instructions at home:  Medicines  Take over-the-counter and prescription medicines only as told by your doctor.  If you were prescribed an antibiotic medicine, take it as told by your doctor. Do not stop taking the antibiotic even if you start to feel better. General instructions  Rest.  Drink enough fluids to keep your pee (urine) pale yellow.  Avoid smoking and secondhand smoke. If you smoke and you need help quitting, ask your doctor. Quitting will help your lungs heal faster.  Use an inhaler, cool mist vaporizer, or humidifier as told by your doctor.  Keep all follow-up visits as told by your doctor. This is important. How is this prevented? To lower your risk of getting this condition again:  Wash your hands often with soap and water. If you cannot use soap and water, use hand sanitizer.  Avoid contact with people who have cold symptoms.  Try not to touch your hands to your mouth, nose, or eyes.  Make sure to get the flu shot every year. Contact a doctor if:  Your symptoms do not get better in 2 weeks. Get help right away if:  You cough up blood.  You have chest pain.  You have very bad shortness of breath.  You become dehydrated.  You faint (pass out) or keep feeling like you are going to pass out.  You keep throwing up (vomiting).  You have a very bad headache.  Your fever or chills gets worse. This information is not intended to replace advice given to you by your health care provider. Make sure you discuss any questions you have with your health care provider. Document Released: 04/06/2008 Document  Revised: 06/02/2017 Document Reviewed: 04/08/2016 Elsevier Interactive Patient Education  2019 Elsevier Inc.  

## 2019-01-13 NOTE — Progress Notes (Signed)
    Subjective:     Miranda Perkins is a 58 y.o. female here for evaluation of a cough. Onset of symptoms was 7 days ago. Symptoms have been stable since that time. The cough is dry and harsh and is aggravated by nothing. Associated symptoms include: wheezing. Patient does not have a history of asthma. Patient does not have a history of environmental allergens. Patient has not traveled recently. Patient does have a history of smoking, quit in 2011.  The following portions of the patient's history were reviewed and updated as appropriate: allergies, current medications, past family history, past medical history, past social history, past surgical history and problem list.  Review of Systems Pertinent items noted in HPI and remainder of comprehensive ROS otherwise negative.    Objective:    Oxygen saturation 99% on room air BP 132/85   Pulse 86   Temp 98.7 F (37.1 C) (Oral)   Ht 5\' 5"  (1.651 m)   Wt 247 lb (112 kg)   SpO2 98%   BMI 41.10 kg/m  General appearance: alert, cooperative, appears stated age and no distress Head: Normocephalic, without obvious abnormality, atraumatic Eyes: negative Ears: normal TM's and external ear canals both ears Nose: Nares normal. Septum midline. Mucosa normal. No drainage or sinus tenderness. Throat: lips, mucosa, and tongue normal; teeth and gums normal Neck: no adenopathy, no carotid bruit, no JVD, supple, symmetrical, trachea midline and thyroid not enlarged, symmetric, no tenderness/mass/nodules Lungs: wheezes posterior - mild Heart: regular rate and rhythm, S1, S2 normal, no murmur, click, rub or gallop Skin: Skin color, texture, turgor normal. No rashes or lesions Neurologic: Grossly normal    Assessment:   Miranda Perkins was seen today for cough, shortness of breath.  Diagnoses and all orders for this visit:  Acute viral bronchitis Symptomatic care discussed. Medications as prescribed. Report any new or worsening symptoms.  -      predniSONE (DELTASONE) 20 MG tablet; 2 po at sametime daily for 5 days -     albuterol (PROVENTIL) (2.5 MG/3ML) 0.083% nebulizer solution; Take 3 mLs (2.5 mg total) by nebulization every 6 (six) hours as needed for wheezing or shortness of breath. -     benzonatate (TESSALON PERLES) 100 MG capsule; Take 1 capsule (100 mg total) by mouth 3 (three) times daily as needed for cough.      Plan:    Explained lack of efficacy of antibiotics in viral disease. Antitussives per medication orders. Avoid exposure to tobacco smoke and fumes. B-agonist inhaler. Call if shortness of breath worsens, blood in sputum, change in character of cough, development of fever or chills, inability to maintain nutrition and hydration. Avoid exposure to tobacco smoke and fumes.   Return if symptoms worsen or fail to improve.  The above assessment and management plan was discussed with the patient. The patient verbalized understanding of and has agreed to the management plan. Patient is aware to call the clinic if symptoms fail to improve or worsen. Patient is aware when to return to the clinic for a follow-up visit. Patient educated on when it is appropriate to go to the emergency department.   Monia Pouch, FNP-C [facility] [address] [place], [place] [address] [phone]

## 2019-01-14 ENCOUNTER — Other Ambulatory Visit: Payer: Self-pay | Admitting: Family Medicine

## 2019-01-14 ENCOUNTER — Telehealth: Payer: Self-pay | Admitting: Family Medicine

## 2019-01-14 DIAGNOSIS — J208 Acute bronchitis due to other specified organisms: Secondary | ICD-10-CM

## 2019-01-14 MED ORDER — ALBUTEROL SULFATE HFA 108 (90 BASE) MCG/ACT IN AERS: 2.0000 | INHALATION_SPRAY | Freq: Four times a day (QID) | RESPIRATORY_TRACT | 2 refills | Status: DC | PRN

## 2019-01-14 NOTE — Telephone Encounter (Signed)
RX sent

## 2019-01-14 NOTE — Telephone Encounter (Signed)
Pharmacy aware

## 2019-01-18 ENCOUNTER — Encounter: Payer: Self-pay | Admitting: Family Medicine

## 2019-01-18 ENCOUNTER — Other Ambulatory Visit: Payer: Self-pay

## 2019-01-18 ENCOUNTER — Ambulatory Visit (INDEPENDENT_AMBULATORY_CARE_PROVIDER_SITE_OTHER): Admitting: Family Medicine

## 2019-01-18 VITALS — BP 117/78 | HR 73 | Temp 97.1°F | Ht 69.0 in | Wt 246.0 lb

## 2019-01-18 DIAGNOSIS — M2550 Pain in unspecified joint: Secondary | ICD-10-CM

## 2019-01-18 DIAGNOSIS — R601 Generalized edema: Secondary | ICD-10-CM

## 2019-01-18 DIAGNOSIS — Z8261 Family history of arthritis: Secondary | ICD-10-CM

## 2019-01-18 NOTE — Progress Notes (Signed)
Subjective: CC: edema PCP: [name], DO VQQ:[id number] Miranda Perkins is Miranda 58 y.o. female presenting to clinic today for:  1. Edema Patient with Miranda 1-1/2 to 2-year history of generalized edema that waxes and wanes.  She was actually recently referred to cardiology for concern for possible heart component.  She had evaluation by Dr. Harl Bowie who started her on Lasix in efforts to improve her symptoms.  Though she reports that he did not feel that the swelling was related to the heart.  Her echocardiogram showed normal systolic function with EF of 60 to 65%.  She notes that she found urine output increased by 1 or 2 extra visits to the bathroom on 60 mg of Lasix.  She notes that the most improvement that she is seen has been with use of oral steroids.  She is currently on oral steroids for Miranda bronchitis and reports remarkable improvement in generalized swelling.  She has had polyarthralgia for many years.  She is had multiple orthopedic surgeries over the last year.  She reports Miranda very strong family history of rheumatoid arthritis in her mother, sister and niece.  All 3 of her family members that have required oxygen related to the RA.  She does report some changes in her vision and has an appointment scheduled with her optometrist soon.  ROS: Per HPI  Allergies  Allergen Reactions  . Influenza Vaccines Anaphylaxis and Other (See Comments)    Per patient  . Penicillins Anaphylaxis and Other (See Comments)    PATIENT HAS HAD Miranda PCN REACTION WITH IMMEDIATE RASH, FACIAL/TONGUE/THROAT SWELLING, SOB, OR LIGHTHEADEDNESS WITH HYPOTENSION:  #  #  #  YES  #  #  #   Has patient had Miranda PCN reaction causing severe rash involving mucus membranes or skin necrosis: No PATIENT HAS HAD Miranda PCN REACTION THAT REQUIRED HOSPITALIZATION:  #  #  #  YES  #  #  #  Has patient had Miranda PCN reaction occurring within the last 10 years: No   . Cortisone Other (See Comments)    Turned red and ran Miranda low grade fever for 3 days   Past Medical History:  Diagnosis Date  . 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 Carpal tunnel syndrome    BILATERAL HANDS  . Depression   . Osteopenia 12/2017   T score -1.2 FRAX 4.9% / 0.3%  . Stroke ([place]) 1993   while on Chloramphenicol, no residuaL; [MEDICATION WAS GIVEN TO TREAT TICK BITE] ; patient states  "i stroked out right next to my doctor , he said i passed out and that was my only symptom" ;     Current Outpatient Medications:  .  albuterol (PROVENTIL HFA;VENTOLIN HFA) 108 (90 Base) MCG/ACT inhaler, Inhale 2 puffs into the lungs every 6 (six) hours as needed for wheezing or shortness of breath., Disp: 1 Inhaler, Rfl: 2 .  benzonatate (TESSALON PERLES) 100 MG capsule, Take 1 capsule (100 mg total) by mouth 3 (three) times daily as needed for cough., Disp: 20 capsule, Rfl: 0 .  doxycycline (VIBRAMYCIN) 50 MG capsule, Take 50 mg by mouth as needed (rosacea)., Disp: , Rfl:  .  furosemide (LASIX) 40 MG tablet, Take 60 mg by mouth., Disp: , Rfl:  .  ibuprofen (ADVIL,MOTRIN) 200 MG tablet, Take 200 mg by mouth every 6 (six) hours as needed., Disp: , Rfl:  .  predniSONE (DELTASONE)  20 MG tablet, 2 po at sametime daily for 5 days, Disp: 10 tablet, Rfl: 0 Social History   Socioeconomic History  . Marital status: Married    Spouse name: Not on file  . Number of children: 6  . Years of education: Not on file  . Highest education level: Not on file  Occupational History  . 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 Financial resource strain: Not on file  . Food insecurity:    Worry: Not on file    Inability: Not on file  . Transportation needs:    Medical: Not on file    Non-medical: Not on file  Tobacco Use  . Smoking status: Former Smoker    Packs/day: 0.50    Types: Cigarettes    Last attempt to quit: 11/26/2009    Years since quitting: 9.1  . Smokeless tobacco: Never Used  Substance  and Sexual Activity  . Alcohol use: Yes    Comment: maybe 2 drinks Miranda year  . Drug use: No  . Sexual activity: Not Currently    Comment: 1st intercourse 58 yo-5 partners  Lifestyle  . Physical activity:    Days per week: Not on file    Minutes per session: Not on file  . Stress: Not on file  Relationships  . Social connections:    Talks on phone: Not on file    Gets together: Not on file    Attends religious service: Not on file    Active member of club or organization: Not on file    Attends meetings of clubs or organizations: Not on file    Relationship status: Not on file  . Intimate partner violence:    Fear of current or ex partner: Not on file    Emotionally abused: Not on file    Physically abused: Not on file    Forced sexual activity: Not on file  Other Topics Concern  . Not on file  Social History Narrative   Lives with husband. Raising 2 grandchildren of deceased daughter.       Lives in one story ranch house.    Family History  Problem Relation Age of Onset  . Rheum arthritis Sister   . Clotting disorder Sister   . Rheum arthritis Sister   . Thyroid disease Sister   . Clotting disorder Maternal Aunt   . Clotting disorder Maternal Aunt   . Heart Problems Mother   . Rheum arthritis Mother   . Cancer Father        ? type  . Cancer Paternal Grandfather        Brain cancer    Objective: Office vital signs reviewed. BP 117/78   Pulse 73   Temp (!) 97.1 F (36.2 C) (Oral)   Ht '5\' 9"'$  (1.753 m)   Wt 246 lb (111.6 kg)   BMI 36.33 kg/m   Physical Examination:  General: Awake, alert, obese, No acute distress HEENT: Normal, sclera white Cardio: regular rate and rhythm, S1S2 heard, no murmurs appreciated Pulm: clear to auscultation bilaterally, no wheezes, rhonchi or rales; normal work of breathing on room air MSK: normal gait and station; no joint swelling or disfigurement noted in the hands, wrists, feet (with the exception of Miranda postsurgical pinky toe on  the left side where the bones were removed as Miranda result of Miranda compression fracture). Extremities: Warm, well-perfused.  Trace pedal edema appreciated on today's exam Skin: dry;  intact; no rashes or lesions   Assessment/ Plan: 58 y.o. female   1. Generalized edema Of uncertain etiology.  I am surprised that the edema is responded to oral steroids.  Typically I would expect worsening edema with corticosteroids.  For this reason and because she has such Miranda strong family history of autoimmune disease I am pursuing an autoimmune work-up.  I will contact the patient with results once they are available.  Low threshold for referral to rheumatology if anything is positive.  Agree with eye examination.  Plan pending lab work-up - CMP14+EGFR - ANA w/Reflex if Positive - CBC - C-reactive protein - Sedimentation Rate - Rheumatoid factor  2. Polyarthralgia - CMP14+EGFR - ANA w/Reflex if Positive - CBC - C-reactive protein - Sedimentation Rate - Rheumatoid factor  3. Family history of rheumatoid arthritis - CMP14+EGFR - ANA w/Reflex if Positive - CBC - C-reactive protein - Sedimentation Rate - Rheumatoid factor   Orders Placed This Encounter  Procedures  . CMP14+EGFR  . ANA w/Reflex if Positive  . CBC  . C-reactive protein  . Sedimentation Rate  . Rheumatoid factor   No orders of the defined types were placed in this encounter.    [name], DO [place] [phone]

## 2019-01-18 NOTE — Patient Instructions (Signed)
I am working you up for autoimmune disease.  I am suspicious of this given your response to steroids.   You had labs performed today.  You will be contacted with the results of the labs once they are available, usually in the next 3 business days for routine lab work.   Coronavirus (COVID-19) Are you at risk?  Are you at risk for the Coronavirus (COVID-19)?  To be considered HIGH RISK for Coronavirus (COVID-19), you have to meet the following criteria:  . Traveled to [place], [place], [place], [place] or [place]; or in the [place] to [place], [place], [place], or [place]; and have fever, cough, and shortness of breath within the last 2 weeks of travel OR . Been in close contact with a person diagnosed with COVID-19 within the last 2 weeks and have fever, cough, and shortness of breath . IF YOU DO NOT MEET THESE CRITERIA, YOU ARE CONSIDERED LOW RISK FOR COVID-19.  What to do if you are HIGH RISK for COVID-19?  [name] If you are having a medical emergency, call 911. . Seek medical care right away. Before you go to a doctor's office, urgent care or emergency department, call ahead and tell them about your recent travel, contact with someone diagnosed with COVID-19, and your symptoms. You should receive instructions from your physician's office regarding next steps of care.  . When you arrive at healthcare provider, tell the healthcare staff immediately you have returned from visiting [place], [place], [place], [place] or [place]; or traveled in the [place] to [place], [place], [place], or [place]; in the last two weeks or you have been in close contact with a person diagnosed with COVID-19 in the last 2 weeks.   . Tell the health care staff about your symptoms: fever, cough and shortness of breath. . After you have been seen by a medical provider, you will be either: o Tested for (COVID-19) and discharged home on quarantine except to seek medical care if symptoms worsen, and asked  to  - Stay home and avoid contact with others until you get your results (4-5 days)  - Avoid travel on public transportation if possible (such as bus, train, or airplane) or o Sent to the Emergency Department by EMS for evaluation, COVID-19 testing, and possible admission depending on your condition and test results.  What to do if you are LOW RISK for COVID-19?  Reduce your risk of any infection by using the same precautions used for avoiding the common cold or flu:  [name] Wash your hands often with soap and warm water for at least 20 seconds.  If soap and water are not readily available, use an alcohol-based hand sanitizer with at least 60% alcohol.  . If coughing or sneezing, cover your mouth and nose by coughing or sneezing into the elbow areas of your shirt or coat, into a tissue or into your sleeve (not your hands). . Avoid shaking hands with others and consider head nods or verbal greetings only. . Avoid touching your eyes, nose, or mouth with unwashed hands.  . Avoid close contact with people who are sick. . Avoid places or events with large numbers of people in one location, like concerts or sporting events. . Carefully consider travel plans you have or are making. . If you are planning any travel outside or inside the [place], visit the CDC's Travelers' Health webpage for the latest health notices. . If you have some symptoms but not all  symptoms, continue to monitor at home and seek medical attention if your symptoms worsen. . If you are having a medical emergency, call 911.   [place] / e-Visit: [email or web address]         [facility]: [facility]: [phone]                   [facility]: [phone]

## 2019-01-19 LAB — CBC
Hematocrit: 42.9 % (ref 34.0–46.6)
Hemoglobin: 14 g/dL (ref 11.1–15.9)
MCH: 28.9 pg (ref 26.6–33.0)
MCHC: 32.6 g/dL (ref 31.5–35.7)
MCV: 89 fL (ref 79–97)
Platelets: 331 10*3/uL (ref 150–450)
RBC: 4.85 x10E6/uL (ref 3.77–5.28)
RDW: 13.4 % (ref 11.7–15.4)
WBC: 9.2 10*3/uL (ref 3.4–10.8)

## 2019-01-19 LAB — CMP14+EGFR
ALBUMIN: 4.4 g/dL (ref 3.8–4.9)
ALT: 25 IU/L (ref 0–32)
AST: 13 IU/L (ref 0–40)
Albumin/Globulin Ratio: 2.2 (ref 1.2–2.2)
Alkaline Phosphatase: 73 IU/L (ref 39–117)
BUN/Creatinine Ratio: 20 (ref 9–23)
BUN: 17 mg/dL (ref 6–24)
Bilirubin Total: 0.4 mg/dL (ref 0.0–1.2)
CO2: 25 mmol/L (ref 20–29)
Calcium: 9.1 mg/dL (ref 8.7–10.2)
Chloride: 102 mmol/L (ref 96–106)
Creatinine, Ser: 0.87 mg/dL (ref 0.57–1.00)
GFR calc Af Amer: 84 mL/min/{1.73_m2} (ref >59)
GFR, EST NON AFRICAN AMERICAN: 73 mL/min/{1.73_m2} (ref >59)
Globulin, Total: 2 g/dL (ref 1.5–4.5)
Glucose: 80 mg/dL (ref 65–99)
POTASSIUM: 3.9 mmol/L (ref 3.5–5.2)
Sodium: 141 mmol/L (ref 134–144)
TOTAL PROTEIN: 6.4 g/dL (ref 6.0–8.5)

## 2019-01-19 LAB — SEDIMENTATION RATE: Sed Rate: 9 mm/hr (ref 0–40)

## 2019-01-19 LAB — ANA W/REFLEX IF POSITIVE: Anti Nuclear Antibody(ANA): NEGATIVE

## 2019-01-19 LAB — C-REACTIVE PROTEIN: CRP: <1 mg/L (ref 0–10)

## 2019-01-19 LAB — RHEUMATOID FACTOR: Rheumatoid fact SerPl-aCnc: <10 IU/mL (ref 0.0–13.9)

## 2019-02-01 ENCOUNTER — Telehealth: Payer: Self-pay | Admitting: *Deleted

## 2019-02-01 ENCOUNTER — Encounter: Payer: Self-pay | Admitting: Family Medicine

## 2019-02-01 MED ORDER — FUROSEMIDE 40 MG PO TABS: 60.0000 mg | ORAL_TABLET | Freq: Every day | ORAL | 1 refills | Status: DC

## 2019-02-01 NOTE — Telephone Encounter (Signed)
Pt contacted per Dr Harl Bowie. History reviewed. No symptoms to suggest any unstable cardiac conditions. Based on discussion, with current pandemic situation, we have postponed 02/07/19 appointment until June 2020 . If symptoms change, pt has been instructed to contact our office - pt declined virtual appt at this time - requested refills on lasix - Medication sent to pharmacy.

## 2019-02-07 ENCOUNTER — Ambulatory Visit: Admitting: Cardiology

## 2019-02-20 ENCOUNTER — Other Ambulatory Visit: Payer: Self-pay

## 2019-02-20 ENCOUNTER — Other Ambulatory Visit: Payer: Self-pay | Admitting: Pediatrics

## 2019-02-20 MED ORDER — FUROSEMIDE 40 MG PO TABS: 60.0000 mg | ORAL_TABLET | Freq: Every day | ORAL | 2 refills | Status: DC

## 2019-02-20 NOTE — Telephone Encounter (Signed)
refilled lasix per fax request 

## 2019-02-21 ENCOUNTER — Other Ambulatory Visit: Payer: Self-pay | Admitting: *Deleted

## 2019-02-21 DIAGNOSIS — J208 Acute bronchitis due to other specified organisms: Secondary | ICD-10-CM

## 2019-02-21 NOTE — Telephone Encounter (Signed)
This was prescribed for an acute problem by Dina Rich for an upper respiratory infection.  Is she actually needing this long term?

## 2019-02-24 MED ORDER — ALBUTEROL SULFATE HFA 108 (90 BASE) MCG/ACT IN AERS: 2.0000 | INHALATION_SPRAY | Freq: Four times a day (QID) | RESPIRATORY_TRACT | 0 refills | Status: DC | PRN

## 2019-02-24 NOTE — Telephone Encounter (Signed)
LMTCB if needing this long term

## 2019-02-24 NOTE — Telephone Encounter (Signed)
Is using as needed for allergies

## 2019-04-07 ENCOUNTER — Telehealth: Payer: Self-pay | Admitting: Cardiology

## 2019-04-07 NOTE — Telephone Encounter (Signed)

## 2019-04-12 ENCOUNTER — Encounter: Payer: Self-pay | Admitting: Cardiology

## 2019-04-12 ENCOUNTER — Telehealth (INDEPENDENT_AMBULATORY_CARE_PROVIDER_SITE_OTHER): Admitting: Cardiology

## 2019-04-12 VITALS — Ht 66.0 in | Wt 239.0 lb

## 2019-04-12 DIAGNOSIS — R600 Localized edema: Secondary | ICD-10-CM

## 2019-04-12 DIAGNOSIS — R0602 Shortness of breath: Secondary | ICD-10-CM

## 2019-04-12 NOTE — Progress Notes (Signed)
Virtual Visit via Telephone Note   This visit type was conducted due to national recommendations for restrictions regarding the COVID-19 Pandemic (e.g. social distancing) in an effort to limit this patient's exposure and mitigate transmission in our community.  Due to her co-morbid illnesses, this patient is at least at moderate risk for complications without adequate follow up.  This format is felt to be most appropriate for this patient at this time.  The patient did not have access to video technology/had technical difficulties with video requiring transitioning to audio format only (telephone).  All issues noted in this document were discussed and addressed.  No physical exam could be performed with this format.  Please refer to the patient's chart for her  consent to telehealth for [facility].   Date:  04/12/2019   ID:  Miranda Perkins, DOB Mar 08, 1961, MRN [id number]  Patient Location: Home Provider Location: Office  PCP:  [name], DO  Cardiologist:  Carlyle Dolly, MD  Electrophysiologist:  None   Evaluation Performed:  Follow-Up Visit  Chief Complaint:  3 month follow up  History of Present Illness:    Miranda Perkins is a 58 y.o. female seen today for follow up of the following medical problems.    1. SOB/Leg edema - started about 4 months ago - DOE with walking from car to house.  - bilateral swelling legs. +orthopnea - no coughing, no wheezing. Former smoker 30 years.  - has been on HCTZ x 1 month without imprpvement - weight up 8 lbs since 08/2018  07/2018 echo LVEF 54-65%, normal diastolic function - last visit we stopped HCTZ and started lasix 40mg  daily. Did not have much weight loss, we increased to 60mg  daily. On this regimen dropped from 243 to 239 in about a week. Follow up labs were stable   - swelling in legs improved since last visit. Breathing much improved. If misses lasix notes significant weight gain. - limiting sodium intake       The patient does not have symptoms concerning for COVID-19 infection (fever, chills, cough, or new shortness of breath).    Past Medical History:  Diagnosis Date  . Cancer ([place]) 2016   basal cell, forehead  . 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 Carpal tunnel syndrome    BILATERAL HANDS  . Depression   . Osteopenia 12/2017   T score -1.2 FRAX 4.9% / 0.3%  . Stroke ([place]) 1993   while on Chloramphenicol, no residuaL; [MEDICATION WAS GIVEN TO TREAT TICK BITE] ; patient states  "i stroked out right next to my doctor , he said i passed out and that was my only symptom" ;    Past Surgical History:  Procedure Laterality Date  . ANTERIOR CERVICAL DECOMP/DISCECTOMY FUSION N/A 09/27/2017   Procedure: ANTERIOR CERVICAL DECOMPRESSION/DISCECTOMY FUSION CERVICAL FOUR-FIVE ,CERVICAL FIVE-SIX,CERVICAL SIX-SEVEN;  Surgeon: Kary Kos, MD;  Location: [facility];  Service: Neurosurgery;  Laterality: N/A;  . CARPAL TUNNEL RELEASE Left 09/27/2017   Procedure: CARPAL TUNNEL RELEASE;  Surgeon: Kary Kos, MD;  Location: [facility];  Service: Neurosurgery;  Laterality: Left;  . FOOT SURGERY     bone removed from pinky toe  . HERNIA REPAIR    . KNEE SURGERY Left 06/21/2017   dr Rushie Nyhan; meniscus tear   . LEG SURGERY  1972  . MOUTH SURGERY    . skin cancer removal    . TONSILLECTOMY     1979?  . TOTAL KNEE ARTHROPLASTY  Left 01/26/2018   Procedure: LEFT TOTAL KNEE ARTHROPLASTY;  Surgeon: Latanya Maudlin, MD;  Location: [facility];  Service: Orthopedics;  Laterality: Left;  . TUBAL LIGATION       No outpatient medications have been marked as taking for the 04/12/19 encounter (Appointment) with Arnoldo Lenis, MD.     Allergies:   Influenza vaccines; Penicillins; and Cortisone   Social History   Tobacco Use  . Smoking status: Former Smoker    Packs/day: 0.50    Types: Cigarettes    Last attempt to quit: 11/26/2009    Years since quitting: 9.3  . Smokeless tobacco: Never Used  Substance Use  Topics  . Alcohol use: Yes    Comment: maybe 2 drinks a year  . Drug use: No     Family Hx: The patient's family history includes Cancer in her father and paternal grandfather; Clotting disorder in her maternal aunt, maternal aunt, and sister; Heart Problems in her mother; Rheum arthritis in her mother, sister, and sister; Thyroid disease in her sister.  ROS:   Please see the history of present illness.     All other systems reviewed and are negative.   Prior CV studies:   The following studies were reviewed today:  07/2018 echo Study Conclusions  - Left ventricle: The cavity size was normal. Wall thickness was increased in a pattern of mild LVH. Systolic function was normal. The estimated ejection fraction was in the range of 60% to 65%. Wall motion was normal; there were no regional wall motion abnormalities. Left ventricular diastolic function parameters were normal. - Mitral valve: There was trivial regurgitation. - Right atrium: Central venous pressure (est): 3 mm Hg. - Atrial septum: No defect or patent foramen ovale was identified. - Tricuspid valve: There was trivial regurgitation. - Pulmonary arteries: Systolic pressure could not be accurately estimated. - Pericardium, extracardiac: A prominent pericardial fat pad was present, rule out trivial pericardial effusion.  Impressions:  - A prominent pericardial fat pad was present, rule out trivial pericardial effusion. If concern remains about epicardial mass, a chest CT would likely clarify this further.  Labs/Other Tests and Data Reviewed:    EKG:  No ECG reviewed.  Recent Labs: 11/16/2018: TSH 2.310 01/18/2019: ALT 25; BUN 17; Creatinine, Ser 0.87; Hemoglobin 14.0; Platelets 331; Potassium 3.9; Sodium 141   Recent Lipid Panel Lab Results  Component Value Date/Time   CHOL 191 11/16/2018 09:08 AM   TRIG 112 11/16/2018 09:08 AM   HDL 43 11/16/2018 09:08 AM   CHOLHDL 4.4 11/16/2018 09:08 AM    LDLCALC 126 (H) 11/16/2018 09:08 AM    Wt Readings from Last 3 Encounters:  01/18/19 246 lb (111.6 kg)  01/13/19 247 lb (112 kg)  12/09/18 248 lb (112.5 kg)     Objective:    Vital Signs:   Today's Vitals   04/12/19 1250  Weight: 239 lb (108.4 kg)  Height: 5\' 6"  (1.676 m)   Body mass index is 38.58 kg/m.  Normal affect. Normal speech pattern and tone. Comfortable, no apparent distress. No audible signs of SOB or wheezing.   ASSESSMENT & PLAN:    1. SOB/LE edema - fairly benign echo last year - swellng and symptoms have improved on lasix 60mg  daily, stable labs. Continue current therapy    COVID-19 Education: The signs and symptoms of COVID-19 were discussed with the patient and how to seek care for testing (follow up with PCP or arrange E-visit).  The importance of social distancing was discussed  today.  Time:   Today, I have spent 12 minutes with the patient with telehealth technology discussing the above problems.     Medication Adjustments/Labs and Tests Ordered: Current medicines are reviewed at length with the patient today.  Concerns regarding medicines are outlined above.   Tests Ordered: No orders of the defined types were placed in this encounter.   Medication Changes: No orders of the defined types were placed in this encounter.   Disposition:  Follow up 6 months  Signed, Carlyle Dolly, MD  04/12/2019 12:39 PM    [place]

## 2019-04-12 NOTE — Patient Instructions (Signed)

## 2019-08-01 ENCOUNTER — Encounter: Payer: Self-pay | Admitting: Gynecology

## 2020-01-17 ENCOUNTER — Encounter: Payer: Self-pay | Admitting: Cardiology

## 2020-01-17 ENCOUNTER — Telehealth (INDEPENDENT_AMBULATORY_CARE_PROVIDER_SITE_OTHER): Admitting: Cardiology

## 2020-01-17 VITALS — BP 140/97 | HR 98 | Ht 66.0 in | Wt 236.0 lb

## 2020-01-17 DIAGNOSIS — R0602 Shortness of breath: Secondary | ICD-10-CM | POA: Diagnosis not present

## 2020-01-17 DIAGNOSIS — Z87891 Personal history of nicotine dependence: Secondary | ICD-10-CM

## 2020-01-17 DIAGNOSIS — F600 Paranoid personality disorder: Secondary | ICD-10-CM

## 2020-01-17 DIAGNOSIS — R600 Localized edema: Secondary | ICD-10-CM

## 2020-01-17 DIAGNOSIS — Z7189 Other specified counseling: Secondary | ICD-10-CM

## 2020-01-17 NOTE — Patient Instructions (Signed)
Your physician recommends that you schedule a follow-up appointment in: [place]  Your physician recommends that you continue on your current medications as directed. Please refer to the Current Medication list given to you today.  Your physician recommends that you return for lab work CBC/BMP/MG/TSH/HGBA1C/LIPIDS - PLEASE FAST 6-8 HOURS PRIOR TO LAB WORK   Thank you for choosing [facility]!!

## 2020-01-17 NOTE — Progress Notes (Signed)
Virtual Visit via Telephone Note   This visit type was conducted due to national recommendations for restrictions regarding the COVID-19 Pandemic (e.g. social distancing) in an effort to limit this patient's exposure and mitigate transmission in our community.  Due to her co-morbid illnesses, this patient is at least at moderate risk for complications without adequate follow up.  This format is felt to be most appropriate for this patient at this time.  The patient did not have access to video technology/had technical difficulties with video requiring transitioning to audio format only (telephone).  All issues noted in this document were discussed and addressed.  No physical exam could be performed with this format.  Please refer to the patient's chart for her  consent to telehealth for [facility].   The patient was identified using 2 identifiers.  Date:  01/17/2020   ID:  Miranda Perkins, DOB 06-24-1961, MRN [id number]  Patient Location: Home Provider Location: Office  PCP:  [name], DO  Cardiologist:  Carlyle Dolly, MD  Electrophysiologist:  None   Evaluation Performed:  Follow-Up Visit  Chief Complaint:  Follow up visit   History of Present Illness:    Miranda Perkins is a 58 y.o. female seen today for follow up of the following medical problems.    1.SOB/Leg edema - started about 4 months ago - DOE with walking from car to house.  - bilateral swelling legs. +orthopnea - no coughing, no wheezing. Former smoker 30 years.  - has been on HCTZ x 1 monthwithout imprpvement - weight up 8 lbs since 08/2018  07/2018 echo LVEF [id number], normal diastolic function - last visit we stopped HCTZ and started lasix 40mg  daily. Did not have much weight loss, we increased to 60mg  daily. On this regimen dropped from 243 to 239 in about a week. Follow up labs were stable    Weight down to 236 lbs today. Has done well on lasix - no recent SOB or DOE. No recent  edema.     SH: husband is Miranda Perkins  The patient does not have symptoms concerning for COVID-19 infection (fever, chills, cough, or new shortness of breath).    Past Medical History:  Diagnosis Date  . 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 Carpal tunnel syndrome    BILATERAL HANDS  . Depression   . Osteopenia 12/2017   T score -1.2 FRAX 4.9% / 0.3%  . Stroke ([place]) 1993   while on Chloramphenicol, no residuaL; [MEDICATION WAS GIVEN TO TREAT TICK BITE] ; patient states  "i stroked out right next to my doctor , he said i passed out and that was my only symptom" ;    Past Surgical History:  Procedure Laterality Date  . ANTERIOR CERVICAL DECOMP/DISCECTOMY FUSION N/A 09/27/2017   Procedure: ANTERIOR CERVICAL DECOMPRESSION/DISCECTOMY FUSION CERVICAL FOUR-FIVE ,CERVICAL FIVE-SIX,CERVICAL SIX-SEVEN;  Surgeon: Kary Kos, MD;  Location: [place];  Service: Neurosurgery;  Laterality: N/A;  . CARPAL TUNNEL RELEASE Left 09/27/2017   Procedure: CARPAL TUNNEL RELEASE;  Surgeon: Kary Kos, MD;  Location: [place];  Service: Neurosurgery;  Laterality: Left;  . FOOT SURGERY     bone removed from pinky toe  . HERNIA REPAIR    . KNEE SURGERY Left 06/21/2017   dr Rushie Nyhan; meniscus tear   . LEG SURGERY  1972  . MOUTH SURGERY    . skin cancer removal    . TONSILLECTOMY  1979?  . TOTAL KNEE ARTHROPLASTY Left 01/26/2018   Procedure: LEFT TOTAL KNEE ARTHROPLASTY;  Surgeon: Latanya Maudlin, MD;  [facility];  Service: Orthopedics;  Laterality: Left;  . TUBAL LIGATION       No outpatient medications have been marked as taking for the 01/17/20 encounter (Appointment) with Arnoldo Lenis, MD.     Allergies:   Influenza vaccines, Penicillins, and Cortisone   Social History   Tobacco Use  . Smoking status: Former Smoker    Packs/day: 0.50    Types: Cigarettes    Quit date: 11/26/2009    Years since quitting: 10.1  . Smokeless tobacco:  Never Used  Substance Use Topics  . Alcohol use: Yes    Comment: maybe 2 drinks a year  . Drug use: No     Family Hx: The patient's family history includes Cancer in her father and paternal grandfather; Clotting disorder in her maternal aunt, maternal aunt, and sister; Heart Problems in her mother; Rheum arthritis in her mother, sister, and sister; Thyroid disease in her sister.  ROS:   Please see the history of present illness.     All other systems reviewed and are negative.   Prior CV studies:   The following studies were reviewed today:  07/2018 echo Study Conclusions  - Left ventricle: The cavity size was normal. Wall thickness was increased in a pattern of mild LVH. Systolic function was normal. The estimated ejection fraction was in the range of 60% to 65%. Wall motion was normal; there were no regional wall motion abnormalities. Left ventricular diastolic function parameters were normal. - Mitral valve: There was trivial regurgitation. - Right atrium: Central venous pressure (est): 3 mm Hg. - Atrial septum: No defect or patent foramen ovale was identified. - Tricuspid valve: There was trivial regurgitation. - Pulmonary arteries: Systolic pressure could not be accurately estimated. - Pericardium, extracardiac: A prominent pericardial fat pad was present, rule out trivial pericardial effusion.  Impressions:  - A prominent pericardial fat pad was present, rule out trivial pericardial effusion. If concern remains about epicardial mass, a chest CT would likely clarify this further.  Labs/Other Tests and Data Reviewed:    EKG:  No ECG reviewed.  Recent Labs: 01/18/2019: ALT 25; BUN 17; Creatinine, Ser 0.87; Hemoglobin 14.0; Platelets 331; Potassium 3.9; Sodium 141   Recent Lipid Panel Lab Results  Component Value Date/Time   CHOL 191 11/16/2018 09:08 AM   TRIG 112 11/16/2018 09:08 AM   HDL 43 11/16/2018 09:08 AM   CHOLHDL 4.4 11/16/2018  09:08 AM   LDLCALC 126 (H) 11/16/2018 09:08 AM    Wt Readings from Last 3 Encounters:  04/12/19 239 lb (108.4 kg)  01/18/19 246 lb (111.6 kg)  01/13/19 247 lb (112 kg)     Objective:    Vital Signs:   Today's Vitals   01/17/20 0856  BP: (!) 140/97  Pulse: 98  Weight: 236 lb (107 kg)  Height: 5\' 6"  (1.676 m)   Body mass index is 38.09 kg/m. Normal affect. Normal speech pattern and tone. Comfortable, no   ASSESSMENT & PLAN:    1. SOB/LE edema - has done well on lasix, symptoms are controlled - continue current meds - needs repeat labs, will order for her.    COVID-19 Education: The signs and symptoms of COVID-19 were discussed with the patient and how to seek care for testing (follow up with PCP or arrange E-visit).  The importance of social distancing was discussed today.  Time:   Today, I have spent 22 minutes with the patient with telehealth technology discussing the above problems.     Medication Adjustments/Labs and Tests Ordered: Current medicines are reviewed at length with the patient today.  Concerns regarding medicines are outlined above.   Tests Ordered: No orders of the defined types were placed in this encounter.   Medication Changes: No orders of the defined types were placed in this encounter.   Follow Up:  Either In Person or Virtual in 6 month(s)  Signed, Carlyle Dolly, MD  01/17/2020 8:20 AM    [place]

## 2020-01-17 NOTE — Addendum Note (Signed)
Addended by: Julian Hy T on: 01/17/2020 09:58 AM   Modules accepted: Orders

## 2020-03-14 ENCOUNTER — Other Ambulatory Visit: Payer: Self-pay | Admitting: Cardiology

## 2020-03-14 ENCOUNTER — Other Ambulatory Visit: Payer: Self-pay | Admitting: *Deleted

## 2020-03-28 ENCOUNTER — Ambulatory Visit (INDEPENDENT_AMBULATORY_CARE_PROVIDER_SITE_OTHER): Admitting: Family Medicine

## 2020-03-28 ENCOUNTER — Encounter: Payer: Self-pay | Admitting: Family Medicine

## 2020-03-28 DIAGNOSIS — L719 Rosacea, unspecified: Secondary | ICD-10-CM | POA: Diagnosis not present

## 2020-03-28 DIAGNOSIS — R234 Changes in skin texture: Secondary | ICD-10-CM | POA: Diagnosis not present

## 2020-03-28 MED ORDER — DOXYCYCLINE HYCLATE 50 MG PO CAPS: 50.0000 mg | ORAL_CAPSULE | Freq: Every day | ORAL | 0 refills | Status: DC

## 2020-03-28 NOTE — Progress Notes (Signed)
Virtual Visit via Telephone Note  I connected with Miranda Perkins on 03/28/20 at 11:28 AM by telephone and verified that I am speaking with the correct person using two identifiers. Miranda Perkins is currently located at home and her grandsons are currently with her during this visit. The provider, Loman Brooklyn, FNP is located in their home at time of visit.  I discussed the limitations, risks, security and privacy concerns of performing an evaluation and management service by telephone and the availability of in person appointments. I also discussed with the patient that there may be a patient responsible charge related to this service. The patient expressed understanding and agreed to proceed.  Subjective: PCP: Miranda Norlander, DO  Chief Complaint  Patient presents with  . Hand Pain  . Medication Refill   Patient is in need of a refill of doxycycline.  She takes doxycycline 50 mg once daily as needed when she is having a flare of her rosacea.  She is also requesting a referral to dermatology due to cracks and bleeding on her hands and fingers.  This has been going on for 3 months now and she is having a hard time getting them to heal.  She states the only thing she has found to seal the cracks is beeswax.  She has also tried hydrocortisone and antibiotic creams.   ROS: Per HPI  Current Outpatient Medications:  .  Biotin 1000 MCG tablet, Take 1,000 mcg by mouth daily. , Disp: , Rfl:  .  Cholecalciferol (VITAMIN D3 PO), Take by mouth., Disp: , Rfl:  .  doxycycline (VIBRAMYCIN) 50 MG capsule, Take 1 capsule (50 mg total) by mouth daily. (up to 7 days), Disp: 90 capsule, Rfl: 0 .  furosemide (LASIX) 40 MG tablet, TAKE ONE AND ONE-HALF TABLETS DAILY, Disp: 135 tablet, Rfl: 3 .  vitamin B-12 (CYANOCOBALAMIN) 500 MCG tablet, Take 500 mcg by mouth daily., Disp: , Rfl:   Allergies  Allergen Reactions  . Influenza Vaccines Anaphylaxis and Other (See Comments)    Per patient    . Penicillins Anaphylaxis and Other (See Comments)    PATIENT HAS HAD A PCN REACTION WITH IMMEDIATE RASH, FACIAL/TONGUE/THROAT SWELLING, SOB, OR LIGHTHEADEDNESS WITH HYPOTENSION:  #  #  #  YES  #  #  #   Has patient had a PCN reaction causing severe rash involving mucus membranes or skin necrosis: No PATIENT HAS HAD A PCN REACTION THAT REQUIRED HOSPITALIZATION:  #  #  #  YES  #  #  #  Has patient had a PCN reaction occurring within the last 10 years: No   . Cortisone Other (See Comments)    Turned red and ran a low grade fever for 3 days   Past Medical History:  Diagnosis Date  . Cancer ([place]) 2016   basal cell, forehead  . 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 Carpal tunnel syndrome    BILATERAL HANDS  . Depression   . Osteopenia 12/2017   T score -1.2 FRAX 4.9% / 0.3%  . Stroke ([place]) 1993   while on Chloramphenicol, no residuaL; [MEDICATION WAS GIVEN TO TREAT TICK BITE] ; patient states  "i stroked out right next to my doctor , he said i passed out and that was my only symptom" ;     Observations/Objective: A&O  No respiratory distress or wheezing audible over the phone Mood, judgement, and thought processes all WNL   Assessment and  Plan: 1. Rosacea - Well controlled on current regimen.  Patient only in need of a refill. - doxycycline (VIBRAMYCIN) 50 MG capsule; Take 1 capsule (50 mg total) by mouth daily. (up to 7 days)  Dispense: 90 capsule; Refill: 0  2. Cracked skin - Referral placed but encourage patient to try Miracle Hand Repair Cream while waiting for her appointment. - Ambulatory referral to Dermatology   Follow Up Instructions:  I discussed the assessment and treatment plan with the patient. The patient was provided an opportunity to ask questions and all were answered. The patient agreed with the plan and demonstrated an understanding of the instructions.   The patient was advised to call back or seek an in-person evaluation if the symptoms worsen or if the  condition fails to improve as anticipated.  The above assessment and management plan was discussed with the patient. The patient verbalized understanding of and has agreed to the management plan. Patient is aware to call the clinic if symptoms persist or worsen. Patient is aware when to return to the clinic for a follow-up visit. Patient educated on when it is appropriate to go to the emergency department.   Time call ended: 11:37 AM  I provided 11 minutes of non-face-to-face time during this encounter.  Hendricks Limes, MSN, APRN, FNP-C [facility] 03/28/20

## 2020-03-29 ENCOUNTER — Other Ambulatory Visit: Payer: Self-pay | Admitting: *Deleted

## 2020-03-29 DIAGNOSIS — L719 Rosacea, unspecified: Secondary | ICD-10-CM

## 2020-03-29 MED ORDER — DOXYCYCLINE HYCLATE 50 MG PO CAPS: ORAL_CAPSULE | ORAL | 0 refills | Status: DC

## 2020-04-03 ENCOUNTER — Telehealth: Payer: Self-pay | Admitting: Family Medicine

## 2020-04-03 NOTE — Telephone Encounter (Signed)
Take 1 tablet daily for up to 7 days per flare. #90.

## 2020-04-03 NOTE — Telephone Encounter (Signed)
FYI for provider: Pharmacist thought it would cover the quantity of 90 if we put , take one daily as directed with the assumption patient has taken these before and knows to take for up to seven days. I said okay to help fill script.  Reviewed past scripts .

## 2020-04-03 NOTE — Telephone Encounter (Signed)
Case # [id number] [facility] needs clarification on doxycycline (VIBRAMYCIN) 50 MG capsule please call back

## 2020-06-10 ENCOUNTER — Telehealth: Payer: Self-pay | Admitting: "Endocrinology

## 2020-06-10 DIAGNOSIS — E039 Hypothyroidism, unspecified: Secondary | ICD-10-CM

## 2020-06-10 DIAGNOSIS — E042 Nontoxic multinodular goiter: Secondary | ICD-10-CM

## 2020-06-10 NOTE — Telephone Encounter (Signed)
Also she needs an order for an ultrasound sent to [facility]

## 2020-06-10 NOTE — Telephone Encounter (Signed)
Can you update lab order °

## 2020-06-10 NOTE — Telephone Encounter (Signed)
Orders updated and thyroid ultrasound ordered at [facility].

## 2020-06-11 ENCOUNTER — Other Ambulatory Visit: Payer: Self-pay

## 2020-06-11 ENCOUNTER — Other Ambulatory Visit

## 2020-06-11 DIAGNOSIS — E039 Hypothyroidism, unspecified: Secondary | ICD-10-CM

## 2020-06-12 LAB — T4, FREE: Free T4: 1.13 ng/dL (ref 0.82–1.77)

## 2020-06-12 LAB — T3, FREE: T3, Free: 3.3 pg/mL (ref 2.0–4.4)

## 2020-06-12 LAB — TSH: TSH: 2.15 u[IU]/mL (ref 0.450–4.500)

## 2020-06-18 ENCOUNTER — Ambulatory Visit (HOSPITAL_COMMUNITY)

## 2020-06-24 ENCOUNTER — Other Ambulatory Visit: Payer: Self-pay

## 2020-06-24 ENCOUNTER — Ambulatory Visit (HOSPITAL_COMMUNITY)
Admission: RE | Admit: 2020-06-24 | Discharge: 2020-06-24 | Disposition: A | Discharge status: Home / Self Care | Source: Ambulatory Visit | Attending: "Endocrinology | Admitting: "Endocrinology

## 2020-06-24 DIAGNOSIS — E042 Nontoxic multinodular goiter: Secondary | ICD-10-CM | POA: Insufficient documentation

## 2020-06-25 ENCOUNTER — Ambulatory Visit (INDEPENDENT_AMBULATORY_CARE_PROVIDER_SITE_OTHER): Admitting: "Endocrinology

## 2020-06-25 ENCOUNTER — Encounter: Payer: Self-pay | Admitting: "Endocrinology

## 2020-06-25 VITALS — BP 104/74 | HR 80 | Ht 66.0 in | Wt 244.4 lb

## 2020-06-25 DIAGNOSIS — E042 Nontoxic multinodular goiter: Secondary | ICD-10-CM | POA: Diagnosis not present

## 2020-06-25 NOTE — Progress Notes (Signed)
06/25/2020   Endocrinology follow-up note   Subjective:    Patient ID: Miranda Perkins, female    DOB: 1960/11/03, PCP [name], DO   Past Medical History:  Diagnosis Date  . Cancer ([place]) 2016   basal cell, forehead  . 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 Carpal tunnel syndrome    BILATERAL HANDS  . Depression   . Osteopenia 12/2017   T score -1.2 FRAX 4.9% / 0.3%  . Stroke ([place]) 1993   while on Chloramphenicol, no residuaL; [MEDICATION WAS GIVEN TO TREAT TICK BITE] ; patient states  "i stroked out right next to my doctor , he said i passed out and that was my only symptom" ;    Past Surgical History:  Procedure Laterality Date  . ANTERIOR CERVICAL DECOMP/DISCECTOMY FUSION N/A 09/27/2017   Procedure: ANTERIOR CERVICAL DECOMPRESSION/DISCECTOMY FUSION CERVICAL FOUR-FIVE ,CERVICAL FIVE-SIX,CERVICAL SIX-SEVEN;  Surgeon: Kary Kos, MD;  Location: [place];  Service: Neurosurgery;  Laterality: N/A;  . CARPAL TUNNEL RELEASE Left 09/27/2017   Procedure: CARPAL TUNNEL RELEASE;  Surgeon: Kary Kos, MD;  Location: [place];  Service: Neurosurgery;  Laterality: Left;  . FOOT SURGERY     bone removed from pinky toe  . HERNIA REPAIR    . KNEE SURGERY Left 06/21/2017   dr Rushie Nyhan; meniscus tear   . LEG SURGERY  1972  . MOUTH SURGERY    . skin cancer removal    . TONSILLECTOMY     1979?  . TOTAL KNEE ARTHROPLASTY Left 01/26/2018   Procedure: LEFT TOTAL KNEE ARTHROPLASTY;  Surgeon: Latanya Maudlin, MD;  [facility];  Service: Orthopedics;  Laterality: Left;  . TUBAL LIGATION     Social History   Socioeconomic History  . Marital status: Married    Spouse name: Not on file  . Number of children: 6  . Years of education: Not on file  . Highest education level: Not on file  Occupational History  . 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 Smoking status: Former Smoker    Packs/day: 0.50    Types: Cigarettes    Quit date:  11/26/2009    Years since quitting: 10.5  . Smokeless tobacco: Never Used  Vaping Use  . Vaping Use: Never used  Substance and Sexual Activity  . Alcohol use: Yes    Comment: maybe 2 drinks a year  . Drug use: No  . Sexual activity: Not Currently    Comment: 1st intercourse 14 yo-5 partners  Other Topics Concern  . Not on file  Social History Narrative   Lives with husband. Raising 2 grandchildren of deceased daughter.       Lives in one story ranch house.    Social Determinants of Health   Financial Resource Strain:   . Difficulty of Paying Living Expenses: Not on file  Food Insecurity:   . Worried About Charity fundraiser in the Last Year: Not on file  . Ran Out of Food in the Last Year: Not on file  Transportation Needs:   . Lack of Transportation (Medical): Not on file  . Lack of Transportation (Non-Medical): Not on file  Physical Activity:   . Days of Exercise per Week: Not on file  . Minutes of Exercise per Session: Not on file  Stress:   . Feeling of Stress : Not on file  Social Connections:   . Frequency of Communication  with Friends and Family: Not on file  . Frequency of Social Gatherings with Friends and Family: Not on file  . Attends Religious Services: Not on file  . Active Member of Clubs or Organizations: Not on file  . Attends Archivist Meetings: Not on file  . Marital Status: Not on file   Outpatient Encounter Medications as of 06/25/2020  Medication Sig  . Biotin 1000 MCG tablet Take 1,000 mcg by mouth daily.   . Cholecalciferol (VITAMIN D3 PO) Take by mouth.  . doxycycline (VIBRAMYCIN) 50 MG capsule Take 50 mg by mouth daily for up to 7 days as needed for rosacea flares.  . furosemide (LASIX) 40 MG tablet TAKE ONE AND ONE-HALF TABLETS DAILY  . vitamin B-12 (CYANOCOBALAMIN) 500 MCG tablet Take 500 mcg by mouth daily.   No facility-administered encounter medications on file as of 06/25/2020.   ALLERGIES: Allergies  Allergen Reactions  .  Influenza Vaccines Anaphylaxis and Other (See Comments)    Per patient  . Penicillins Anaphylaxis and Other (See Comments)    PATIENT HAS HAD A PCN REACTION WITH IMMEDIATE RASH, FACIAL/TONGUE/THROAT SWELLING, SOB, OR LIGHTHEADEDNESS WITH HYPOTENSION:  #  #  #  YES  #  #  #   Has patient had a PCN reaction causing severe rash involving mucus membranes or skin necrosis: No PATIENT HAS HAD A PCN REACTION THAT REQUIRED HOSPITALIZATION:  #  #  #  YES  #  #  #  Has patient had a PCN reaction occurring within the last 10 years: No   . Cortisone Other (See Comments)    Turned red and ran a low grade fever for 3 days   VACCINATION STATUS: Immunization History  Administered Date(s) Administered  . Moderna SARS-COVID-2 Vaccination 01/19/2020, 02/19/2020    HPI 59 yr old female with above medical hx. She is here to follow-up for her history of multinodular goiter with repeat thyroid function tests, as well as repeat thyroid ultrasound. -Her series of thyroid ultrasound readings  Are largely unchanged from her prior studies- Showing stable 1.7 cm nodule on the right lobe which was previously biopsied and stable 1.1 cm nodule on the right lobe of the thyroid, as well as new 0.4 cm unremarkable  nodule in the left lobe of thyroid.    -Ultrasound reports from 2015 and 16 have been  scanned in her records.  -Fine-needle aspiration from November 2015 in [place]  showed benign nodular goiter bethesda system category  II with no cytologic evidence of malignancy.  -Her previsit thyroid ultrasound is unremarkable-summarized below.  She is not on thyroid hormones nor is she on antithyroid meds.  Her previsit thyroid function tests are consistent with euthyroid state. She denies exposure to neck radiation. She denies dysphagia, SOB, voice change. She denies family hx of thyroid cancer. She has no new complaints today.  Review of Systems Constitutional:  +  Weight gain, + fatigue, no  subjective hyperthermia/hypothermia Eyes: no blurry vision, no xerophthalmia ENT: no sore throat, no nodules palpated in throat, no dysphagia/odynophagia, no hoarseness Cardiovascular: no pain, no palpitations.   Musculoskeletal: no muscle/joint aches Skin: no rashes Neurological: no tremors/numbness/tingling/dizziness Psychiatric: no depression/anxiety  Objective:    BP 104/74   Pulse 80   Ht 5\' 6"  (1.676 m)   Wt 244 lb 6.4 oz (110.9 kg)   BMI 39.45 kg/m   Wt Readings from Last 3 Encounters:  06/25/20 244 lb 6.4 oz (110.9 kg)  01/17/20 236 lb (  107 kg)  04/12/19 239 lb (108.4 kg)    Physical Exam Constitutional: + Obese, not in acute distress, normal state of mind. Eyes: PERRLA, EOMI, no exophthalmos ENT: moist mucous membranes, + palpable mild thyromegaly, no cervical lymphadenopathy Musculoskeletal: no deformities, strength intact in all 4 Skin: moist, warm, no rashes Neurological: no tremor with outstretched hands, DTR normal in all 4  Labs from 11/27/2015 showed TSH and free T4 within normal range.  Thyroid ultrasound from Mar 27, 2015 from [place] was reviewed . Largest nodule on right lobe 1.7 cms .   Recent Results (from the past 2160 hour(s))  T3, Free     Status: None   Collection Time: 06/11/20  1:55 PM  Result Value Ref Range   T3, Free 3.3 2.0 - 4.4 pg/mL  T4, free     Status: None   Collection Time: 06/11/20  1:55 PM  Result Value Ref Range   Free T4 1.13 0.82 - 1.77 ng/dL  TSH     Status: None   Collection Time: 06/11/20  1:55 PM  Result Value Ref Range   TSH 2.150 0.450 - 4.500 uIU/mL     Her repeat thyroid ultrasound on 03/26/2016 at [facility] radiology showed unchanged sonogram of the thyroid with 1.7 and 1.1 cm nodules.  Fine-needle aspiration on 09/28/2014 on her right lobe nodule   in [facility] which showed abundant normal follicular cells and a few clusters of intact normal follicles, a small component of  lymphocytes mixed with isolated Hurthle cells.  June 22, 2018 thyroid ultrasound shows right lobe 5.3 cm, increasing from 5 cm with 2 nodules stable 1.7 cm nodule previously biopsied, stable 1.1 cm nodule not biopsied.  Left lobe measures 4.3 cm, increasing from 4 cm with new 0.4 cm nodule.   Thyroid ultrasound on June 24, 2020  Right lobe 5.5 cm, left lobe 4.9 cm IMPRESSION: 1. Confirmed 1 year stability of previously identified thyroid nodules. No interval growth or development of suspicious features. 2. Approximately 1.1 cm TI-RADS category 4 nodule in the right superior gland continues to meet criteria for annual observation. Recommend repeat ultrasound in 1 year. 3. Approximately 1.7 cm TI-RADS category 3 nodule in the right mid gland continues to meet criteria for annual observation. Recommend follow-up in 1 year.   Assessment & Plan:   1. Multiple thyroid nodules/Nontoxic MNG  -She continues to have stable euthyroid multinodular goiter, previously biopsied 1.7 cm nodule on the right lobe is a biggest.  Cytology findings were considered benign. -Her previsit thyroid function tests continue to be within normal limits.  She would not require antithyroid intervention or thyroid hormone supplement at this time.    -She will have repeat thyroid function test and thyroid ultrasound  in 1-2 year, will decide after her next exam. - I advised patient to maintain close follow up with [name], DO for primary care needs.     - Time spent on this patient care encounter:  20 minutes of which 50% was spent in  counseling and the rest reviewing  her current and  previous labs / studies and medications  doses and developing a plan for long term care. Miranda Perkins  participated in the discussions, expressed understanding, and voiced agreement with the above plans.  All questions were answered to her satisfaction. she is encouraged to contact clinic should she have any  questions or concerns prior to her return visit.  Follow up plan: Return in about  1 year (around 06/25/2021) for F/U with Pre-visit Labs.  Glade Lloyd, MD Phone: [phone]  Fax: [phone]   06/25/2020, 1:42 PM

## 2020-07-02 ENCOUNTER — Other Ambulatory Visit: Payer: Self-pay | Admitting: *Deleted

## 2020-07-02 MED ORDER — FUROSEMIDE 40 MG PO TABS: 60.0000 mg | ORAL_TABLET | Freq: Every day | ORAL | 3 refills | Status: DC

## 2020-07-05 ENCOUNTER — Other Ambulatory Visit: Payer: Self-pay

## 2020-07-05 ENCOUNTER — Other Ambulatory Visit (HOSPITAL_COMMUNITY)
Admission: RE | Admit: 2020-07-05 | Discharge: 2020-07-05 | Disposition: A | Discharge status: Home / Self Care | Source: Ambulatory Visit | Attending: Cardiology | Admitting: Cardiology

## 2020-07-05 DIAGNOSIS — R0602 Shortness of breath: Secondary | ICD-10-CM | POA: Insufficient documentation

## 2020-07-05 LAB — CBC
HCT: 46.9 % — ABNORMAL HIGH (ref 36.0–46.0)
Hemoglobin: 15.2 g/dL — ABNORMAL HIGH (ref 12.0–15.0)
MCH: 29.3 pg (ref 26.0–34.0)
MCHC: 32.4 g/dL (ref 30.0–36.0)
MCV: 90.5 fL (ref 80.0–100.0)
Platelets: 289 10*3/uL (ref 150–400)
RBC: 5.18 MIL/uL — ABNORMAL HIGH (ref 3.87–5.11)
RDW: 13 % (ref 11.5–15.5)
WBC: 7.3 10*3/uL (ref 4.0–10.5)
nRBC: 0 % (ref 0.0–0.2)

## 2020-07-05 LAB — BASIC METABOLIC PANEL
Anion gap: 10 (ref 5–15)
BUN: 15 mg/dL (ref 8–23)
CO2: 28 mmol/L (ref 22–32)
Calcium: 9.4 mg/dL (ref 8.9–10.3)
Chloride: 102 mmol/L (ref 98–111)
Creatinine, Ser: 0.73 mg/dL (ref 0.44–1.00)
GFR calc Af Amer: >60 mL/min (ref >60)
GFR calc non Af Amer: >60 mL/min (ref >60)
Glucose, Bld: 110 mg/dL — ABNORMAL HIGH (ref 70–99)
Potassium: 3.8 mmol/L (ref 3.5–5.1)
Sodium: 140 mmol/L (ref 135–145)

## 2020-07-05 LAB — LIPID PANEL
Cholesterol: 198 mg/dL (ref 0–200)
HDL: 37 mg/dL — ABNORMAL LOW (ref >40)
LDL Cholesterol: 134 mg/dL — ABNORMAL HIGH (ref 0–99)
Total CHOL/HDL Ratio: 5.4 RATIO
Triglycerides: 137 mg/dL (ref <150)
VLDL: 27 mg/dL (ref 0–40)

## 2020-07-05 LAB — HEMOGLOBIN A1C
Hgb A1c MFr Bld: 5.6 % (ref 4.8–5.6)
Mean Plasma Glucose: 114.02 mg/dL

## 2020-07-05 LAB — MAGNESIUM: Magnesium: 2.1 mg/dL (ref 1.7–2.4)

## 2020-07-09 ENCOUNTER — Ambulatory Visit (INDEPENDENT_AMBULATORY_CARE_PROVIDER_SITE_OTHER): Admitting: Cardiology

## 2020-07-09 ENCOUNTER — Encounter: Payer: Self-pay | Admitting: Cardiology

## 2020-07-09 VITALS — BP 120/78 | HR 74 | Ht 66.0 in | Wt 241.4 lb

## 2020-07-09 DIAGNOSIS — R600 Localized edema: Secondary | ICD-10-CM | POA: Diagnosis not present

## 2020-07-09 DIAGNOSIS — R0602 Shortness of breath: Secondary | ICD-10-CM

## 2020-07-09 NOTE — Progress Notes (Signed)
Clinical Summary Ms. Davidson is a 59 y.o.female seen today for follow up of the following medical problems.   1.SOB/Leg edema - started about 4 months ago - DOE with walking from car to house.  - bilateral swelling legs. +orthopnea - no coughing, no wheezing. Former smoker 30 years.  - has been on HCTZ x 1 monthwithout imprpvement - weight up 8 lbs since 08/2018  07/2018 echo LVEF 93-81%, normal diastolic function - last visit we stopped HCTZ and started lasix 40mg  daily. Did not have much weight loss, we increased to 60mg  daily. On this regimen dropped from 243 to 239 in about a week. Follow up labs were stable    - swelling legs are stable. Taking 60mg  daily of lasix - recent labs showed normal Cr and K   SH: husband is Tashai Catino  Has had covid vaccine.   Past Medical History:  Diagnosis Date  . 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 Carpal tunnel syndrome    BILATERAL HANDS  . Depression   . Osteopenia 12/2017   T score -1.2 FRAX 4.9% / 0.3%  . Stroke ([place]) 1993   while on Chloramphenicol, no residuaL; [MEDICATION WAS GIVEN TO TREAT TICK BITE] ; patient states  "i stroked out right next to my doctor , he said i passed out and that was my only symptom" ;      Allergies  Allergen Reactions  . Influenza Vaccines Anaphylaxis and Other (See Comments)    Per patient  . Penicillins Anaphylaxis and Other (See Comments)    PATIENT HAS HAD A PCN REACTION WITH IMMEDIATE RASH, FACIAL/TONGUE/THROAT SWELLING, SOB, OR LIGHTHEADEDNESS WITH HYPOTENSION:  #  #  #  YES  #  #  #   Has patient had a PCN reaction causing severe rash involving mucus membranes or skin necrosis: No PATIENT HAS HAD A PCN REACTION THAT REQUIRED HOSPITALIZATION:  #  #  #  YES  #  #  #  Has patient had a PCN reaction occurring within the last 10 years: No   . Cortisone Other (See Comments)    Turned red and ran a low grade fever for 3 days       Current Outpatient Medications  Medication Sig Dispense Refill  . Biotin 1000 MCG tablet Take 1,000 mcg by mouth daily.     . Cholecalciferol (VITAMIN D3 PO) Take by mouth.    . doxycycline (VIBRAMYCIN) 50 MG capsule Take 50 mg by mouth daily for up to 7 days as needed for rosacea flares. 90 capsule 0  . furosemide (LASIX) 40 MG tablet Take 1.5 tablets (60 mg total) by mouth daily. 135 tablet 3  . vitamin B-12 (CYANOCOBALAMIN) 500 MCG tablet Take 500 mcg by mouth daily.     No current facility-administered medications for this visit.     Past Surgical History:  Procedure Laterality Date  . ANTERIOR CERVICAL DECOMP/DISCECTOMY FUSION N/A 09/27/2017   Procedure: ANTERIOR CERVICAL DECOMPRESSION/DISCECTOMY FUSION CERVICAL FOUR-FIVE ,CERVICAL FIVE-SIX,CERVICAL SIX-SEVEN;  Surgeon: Kary Kos, MD;  Location: [facility];  Service: Neurosurgery;  Laterality: N/A;  . CARPAL TUNNEL RELEASE Left 09/27/2017   Procedure: CARPAL TUNNEL RELEASE;  Surgeon: Kary Kos, MD;  Location: [facility];  Service: Neurosurgery;  Laterality: Left;  . FOOT SURGERY     bone removed from pinky toe  . HERNIA REPAIR    . KNEE SURGERY Left 06/21/2017  dr Rushie Nyhan; meniscus tear   . LEG SURGERY  1972  . MOUTH SURGERY    . skin cancer removal    . TONSILLECTOMY     1979?  . TOTAL KNEE ARTHROPLASTY Left 01/26/2018   Procedure: LEFT TOTAL KNEE ARTHROPLASTY;  Surgeon: Latanya Maudlin, MD;  Location: [facility];  Service: Orthopedics;  Laterality: Left;  . TUBAL LIGATION       Allergies  Allergen Reactions  . Influenza Vaccines Anaphylaxis and Other (See Comments)    Per patient  . Penicillins Anaphylaxis and Other (See Comments)    PATIENT HAS HAD A PCN REACTION WITH IMMEDIATE RASH, FACIAL/TONGUE/THROAT SWELLING, SOB, OR LIGHTHEADEDNESS WITH HYPOTENSION:  #  #  #  YES  #  #  #   Has patient had a PCN reaction causing severe rash involving mucus membranes or skin necrosis: No PATIENT HAS HAD A PCN REACTION THAT REQUIRED  HOSPITALIZATION:  #  #  #  YES  #  #  #  Has patient had a PCN reaction occurring within the last 10 years: No   . Cortisone Other (See Comments)    Turned red and ran a low grade fever for 3 days      Family History  Problem Relation Age of Onset  . Rheum arthritis Sister   . Clotting disorder Sister   . Rheum arthritis Sister   . Thyroid disease Sister   . Clotting disorder Maternal Aunt   . Clotting disorder Maternal Aunt   . Heart Problems Mother   . Rheum arthritis Mother   . Cancer Father        ? type  . Cancer Paternal Grandfather        Brain cancer     Social History Ms. Alberta reports that she quit smoking about 10 years ago. Her smoking use included cigarettes. She smoked 0.50 packs per day. She has never used smokeless tobacco. Ms. Pigue reports current alcohol use.   Review of Systems CONSTITUTIONAL: No weight loss, fever, chills, weakness or fatigue.  HEENT: Eyes: No visual loss, blurred vision, double vision or yellow sclerae.No hearing loss, sneezing, congestion, runny nose or sore throat.  SKIN: No rash or itching.  CARDIOVASCULAR: per hpi RESPIRATORY: No shortness of breath, cough or sputum.  GASTROINTESTINAL: No anorexia, nausea, vomiting or diarrhea. No abdominal pain or blood.  GENITOURINARY: No burning on urination, no polyuria NEUROLOGICAL: No headache, dizziness, syncope, paralysis, ataxia, numbness or tingling in the extremities. No change in bowel or bladder control.  MUSCULOSKELETAL: No muscle, back pain, joint pain or stiffness.  LYMPHATICS: No enlarged nodes. No history of splenectomy.  PSYCHIATRIC: No history of depression or anxiety.  ENDOCRINOLOGIC: No reports of sweating, cold or heat intolerance. No polyuria or polydipsia.  [name]   Physical Examination Today's Vitals   07/09/20 1520  BP: 120/78  Pulse: 74  SpO2: 94%  Weight: 241 lb 6.4 oz (109.5 kg)  Height: 5\' 6"  (1.676 m)   Body mass index is 38.96 kg/m.  Gen: resting  comfortably, no acute distress HEENT: no scleral icterus, pupils equal round and reactive, no palptable cervical adenopathy,  CV: RRR, no m/r/g, no jvd Resp: Clear to auscultation bilaterally GI: abdomen is soft, non-tender, non-distended, normal bowel sounds, no hepatosplenomegaly MSK: extremities are warm, no edema.  Skin: warm, no rash Neuro:  no focal deficits Psych: appropriate affect   Diagnostic Studies 07/2018 echo Study Conclusions  - Left ventricle: The cavity size was normal. Wall thickness was increased  in a pattern of mild LVH. Systolic function was normal. The estimated ejection fraction was in the range of 60% to 65%. Wall motion was normal; there were no regional wall motion abnormalities. Left ventricular diastolic function parameters were normal. - Mitral valve: There was trivial regurgitation. - Right atrium: Central venous pressure (est): 3 mm Hg. - Atrial septum: No defect or patent foramen ovale was identified. - Tricuspid valve: There was trivial regurgitation. - Pulmonary arteries: Systolic pressure could not be accurately estimated. - Pericardium, extracardiac: A prominent pericardial fat pad was present, rule out trivial pericardial effusion.  Impressions:  - A prominent pericardial fat pad was present, rule out trivial pericardial effusion. If concern remains about epicardial mass, a chest CT would likely clarify this further.    Assessment and Plan   1. SOB/LE edema - has done well on lasix - swelling contorlled, continue current lasix dosing     Arnoldo Lenis, M.D.

## 2020-07-09 NOTE — Patient Instructions (Signed)

## 2020-07-10 NOTE — Addendum Note (Signed)
Addended by: Julian Hy T on: 07/10/2020 05:12 PM   Modules accepted: Orders

## 2020-07-18 ENCOUNTER — Ambulatory Visit (INDEPENDENT_AMBULATORY_CARE_PROVIDER_SITE_OTHER): Admitting: Obstetrics & Gynecology

## 2020-07-18 ENCOUNTER — Encounter: Payer: Self-pay | Admitting: Obstetrics & Gynecology

## 2020-07-18 ENCOUNTER — Other Ambulatory Visit: Payer: Self-pay

## 2020-07-18 VITALS — BP 126/80

## 2020-07-18 DIAGNOSIS — N8110 Cystocele, unspecified: Secondary | ICD-10-CM | POA: Diagnosis not present

## 2020-07-18 DIAGNOSIS — N816 Rectocele: Secondary | ICD-10-CM | POA: Diagnosis not present

## 2020-07-18 NOTE — Progress Notes (Signed)
    Miranda Perkins Mar 07, 1961 [id number]        59 y.o.  [id number] Married  RP: Vaginal bulging protruding at the vulva causing discomfort and pain progressing over 2 years  HPI: Worsening vaginal bulging which is not protruding at the vulva for about a year. Patient is having a lot of discomfort and pain. Cannot do her regular walking activities or any fitness. Difficulty emptying her bladder well. Able to pass her bowel movements without pushing in the vagina. Patient would like to be sexually active with husband, but the bulging is preventing her to do so. Post menopause, well on no hormone replacement therapy. No postmenopausal bleeding.   OB History  Gravida Para Term Preterm AB Living  4 3     1 2   SAB TAB Ectopic Multiple Live Births  1            # Outcome Date GA Lbr Len/2nd Weight Sex Delivery Anes PTL Lv  4 SAB           3 Para           2 Para           1 Para             Past medical history,surgical history, problem list, medications, allergies, family history and social history were all reviewed and documented in the EPIC chart.   Directed ROS with pertinent positives and negatives documented in the history of present illness/assessment and plan.  Exam:  Vitals:   07/18/20 1517  BP: 126/80   General appearance:  Normal  Abdomen: Normal  Gynecologic exam in laying down and standing positions with Valsalva: Cystocele grade 2/4.  Rectocele grade 2/3.  No uterine prolapse.   Assessment/Plan:  59 y.o. [id number]   1. Cystocele with rectocele Counseling on pessary management done. Patient not interested in pessary in the long run. May use it temporarily until surgery. Counseling on surgeries for cystocele and rectocele. Given the absence of uterine prolapse, decision to preserve the uterus and proceed with Anterior/Posterior repair. Information given to the patient. We will schedule surgery and patient will follow up for preop visit. Depending on the date of  surgery, may fit a pessary in the meantime.  Princess Bruins MD, 3:39 PM 07/18/2020

## 2020-07-22 ENCOUNTER — Other Ambulatory Visit: Payer: Self-pay

## 2020-07-22 ENCOUNTER — Ambulatory Visit (INDEPENDENT_AMBULATORY_CARE_PROVIDER_SITE_OTHER)

## 2020-07-22 ENCOUNTER — Ambulatory Visit (INDEPENDENT_AMBULATORY_CARE_PROVIDER_SITE_OTHER): Admitting: Family Medicine

## 2020-07-22 ENCOUNTER — Encounter: Payer: Self-pay | Admitting: Family Medicine

## 2020-07-22 VITALS — BP 134/80 | HR 80 | Temp 97.6°F | Resp 20 | Ht 66.0 in | Wt 240.0 lb

## 2020-07-22 DIAGNOSIS — M79672 Pain in left foot: Secondary | ICD-10-CM

## 2020-07-22 NOTE — Progress Notes (Signed)
Chief Complaint  Patient presents with  . Left foot pain    HPI  Patient presents today for pain in the left foot at the anterior ankle to midfoot region dorsally.  This is where she points as the point of discomfort.  She denies any injury.  There has been no swelling.  There was just insidious onset of pain over the last few weeks.  No previous similar symptoms noted patient says that her symptoms have improved over the last 2 to 3 days and she is stayed completely off of the left extremity for the last 3 days.  Pain is now down to a 2-4/10.  Previously it had been up to a 6-7/10.  PMH: Smoking status noted ROS: Per HPI  Objective: BP 134/80   Pulse 80   Temp 97.6 F (36.4 C) (Temporal)   Resp 20   Ht 5\' 6"  (1.676 m)   Wt 240 lb (108.9 kg)   SpO2 97%   BMI 38.74 kg/m  Gen: NAD, alert, cooperative with exam Ext: No edema, warm.  The ankle is stable to manipulation for collateral stress with inversion and eversion.  Drawer sign is negative.  Mild tenderness to palpation over the anterior talofibular tibial ligament region. Neuro: Alert and oriented, No gross deficits Preliminary x-ray reading reveals no acute abnormality. Assessment and plan:  1. Left foot pain     She should continue to rest the foot until the pain is resolved.  Then she can increase the use slowly over time.  She can use Tylenol.  She can use some ibuprofen, however with her use of furosemide that needs to be used sparingly.  Apply heat several times a day and stay off of it.  Should her pain increase the first step I had like for him to is to obtain an ASO brace.  She can contact the office and we can provide that is here as well. Orders Placed This Encounter  Procedures  . DG Foot Complete Left    Order Specific Question:   Reason for Exam (SYMPTOM  OR DIAGNOSIS REQUIRED)    Answer:   left foot pain    Order Specific Question:   Preferred imaging location?    Answer:   Internal    Follow up as  needed.  Claretta Fraise, MD

## 2020-07-23 ENCOUNTER — Telehealth: Payer: Self-pay

## 2020-07-23 NOTE — Telephone Encounter (Signed)
I called patient and scheduled her surgery for 08/21/20 at 9:00am at [facility].    I discussed with her the need for Covid testing prior to surgery and reviewed the quarantine protocol for after the test.  I scheduled appt accordingly.  Packet will be sent.

## 2020-07-26 ENCOUNTER — Encounter: Payer: Self-pay | Admitting: Obstetrics & Gynecology

## 2020-08-06 ENCOUNTER — Telehealth: Payer: Self-pay | Admitting: Cardiology

## 2020-08-06 NOTE — Telephone Encounter (Signed)
Pt voiced understanding and appreciative  

## 2020-08-06 NOTE — Telephone Encounter (Signed)
Patient called stating that she was looking over her recent blood work. She is concerned about the RBC, HGB,HCT. Being elevated. She is also wanting to know if she needs to be taking Potassium.   [phone].

## 2020-08-06 NOTE — Telephone Encounter (Signed)
Pt aware that hgb, rbc, hct were slightly elevated and already reviewed by provider at recent office appt - pt wanted to verify that she didn't need potassium supplement since she is taking lasix 60 mg daily (potassium was 3.8 pm on 9/3)

## 2020-08-06 NOTE — Telephone Encounter (Signed)
Potassium of 3.8 is fine, would not start a potassium replacement at this time   Zandra Abts MD

## 2020-08-14 ENCOUNTER — Ambulatory Visit (INDEPENDENT_AMBULATORY_CARE_PROVIDER_SITE_OTHER): Admitting: Obstetrics & Gynecology

## 2020-08-14 ENCOUNTER — Other Ambulatory Visit: Payer: Self-pay

## 2020-08-14 ENCOUNTER — Encounter (HOSPITAL_BASED_OUTPATIENT_CLINIC_OR_DEPARTMENT_OTHER): Payer: Self-pay | Admitting: Obstetrics & Gynecology

## 2020-08-14 ENCOUNTER — Ambulatory Visit: Admitting: Obstetrics & Gynecology

## 2020-08-14 ENCOUNTER — Encounter: Payer: Self-pay | Admitting: Obstetrics & Gynecology

## 2020-08-14 VITALS — BP 140/86

## 2020-08-14 DIAGNOSIS — N816 Rectocele: Secondary | ICD-10-CM

## 2020-08-14 DIAGNOSIS — N8110 Cystocele, unspecified: Secondary | ICD-10-CM

## 2020-08-14 NOTE — Progress Notes (Signed)
    Miranda Perkins 04-05-1961 [id number]        59 y.o.  [id number] Married  RP: Preop Cysto-Rectocele repair scheduled on 08/21/2020  HPI: No change x visit 07/18/2020 when we noted: Worsening vaginal bulging which is not protruding at the vulva for about a year. Patient is having a lot of discomfort and pain. Cannot do her regular walking activities or any fitness. Difficulty emptying her bladder well. Able to pass her bowel movements without pushing in the vagina. Patient would like to be sexually active with husband, but the bulging is preventing her to do so. Post menopause, well on no hormone replacement therapy. No postmenopausal bleeding.   OB History  Gravida Para Term Preterm AB Living  4 3     1 2   SAB TAB Ectopic Multiple Live Births  1            # Outcome Date GA Lbr Len/2nd Weight Sex Delivery Anes PTL Lv  4 SAB           3 Para           2 Para           1 Para             Past medical history,surgical history, problem list, medications, allergies, family history and social history were all reviewed and documented in the EPIC chart.   Directed ROS with pertinent positives and negatives documented in the history of present illness/assessment and plan.  Exam:  Vitals:   08/14/20 0826  BP: 140/86   General appearance:  Normal  Gyn exam 07/18/2020: Cystocele grade 2/4.  Rectocele grade 2/3.  No uterine prolapse.   Assessment/Plan:  59 y.o. [id number]   1. Cystocele with rectocele Counseling on pessary management done last visit, patient declined.  Counseling on surgeries for cystocele and rectocele. Given the absence of uterine prolapse, decision to preserve the uterus and proceed with Anterior/Posterior repair. Information given to the patient.  Preparation before surgery reviewed with patient.  Procedure described and surgical risks and benefits thoroughly reviewed as described below.  Postop precautions and expectations discussed.  Patient voiced understanding  and agreement with plans.                        Patient was counseled as to the risk of surgery to include the following:  1. Infection (prohylactic antibiotics will be administered)  2. DVT/Pulmonary Embolism (prophylactic pneumo compression stockings will be used)  3.Trauma to internal organs requiring additional surgical procedure to repair any injury to internal organs requiring perhaps additional hospitalization days.  4.Hemmorhage requiring transfusion and blood products which carry risks such as anaphylactic reaction, hepatitis and AIDS  Patient had received literature information on the procedure scheduled and all her questions were answered and fully accepts all risk.   Princess Bruins MD, 8:38 AM 08/14/2020

## 2020-08-14 NOTE — Progress Notes (Signed)
    Spoke w/ via phone for pre-op interview---Avaya Mcjunkins Hardin Negus RN  Lab needs dos----  Cbc,urine preg, t/s              Lab results------none  COVID test ------08/17/20 at 1025am Arrive at -------0700am NPO after MN NO Solid Food.  Clear liquids from MN until---0600am Medications to take morning of surgery ----none  Diabetic medication -----none  Patient Special Instructionone  ----- Pre-Op special Istructions -----none  Patient verbalized understanding of instructions that were given at this phone interview. Patient denies shortness of breath, chest pain, fever, cough at this phone interview.

## 2020-08-15 ENCOUNTER — Ambulatory Visit: Admitting: Family Medicine

## 2020-08-17 ENCOUNTER — Other Ambulatory Visit (HOSPITAL_COMMUNITY)
Admission: RE | Admit: 2020-08-17 | Discharge: 2020-08-17 | Disposition: A | Discharge status: Home / Self Care | Source: Ambulatory Visit | Attending: Obstetrics & Gynecology | Admitting: Obstetrics & Gynecology

## 2020-08-17 DIAGNOSIS — Z20822 Contact with and (suspected) exposure to covid-19: Secondary | ICD-10-CM | POA: Diagnosis not present

## 2020-08-17 DIAGNOSIS — Z01812 Encounter for preprocedural laboratory examination: Secondary | ICD-10-CM | POA: Diagnosis not present

## 2020-08-18 LAB — SARS CORONAVIRUS 2 (TAT 6-24 HRS): SARS Coronavirus 2: NEGATIVE

## 2020-08-21 ENCOUNTER — Ambulatory Visit (HOSPITAL_BASED_OUTPATIENT_CLINIC_OR_DEPARTMENT_OTHER): Admitting: Certified Registered"

## 2020-08-21 ENCOUNTER — Encounter (HOSPITAL_BASED_OUTPATIENT_CLINIC_OR_DEPARTMENT_OTHER)
Admission: RE | Disposition: A | Discharge status: Home / Self Care | Payer: Self-pay | Source: Home / Self Care | Attending: Obstetrics & Gynecology

## 2020-08-21 ENCOUNTER — Other Ambulatory Visit: Payer: Self-pay

## 2020-08-21 ENCOUNTER — Ambulatory Visit (HOSPITAL_BASED_OUTPATIENT_CLINIC_OR_DEPARTMENT_OTHER)
Admission: RE | Admit: 2020-08-21 | Discharge: 2020-08-21 | Disposition: A | Discharge status: Home / Self Care | Attending: Obstetrics & Gynecology | Admitting: Obstetrics & Gynecology

## 2020-08-21 ENCOUNTER — Encounter (HOSPITAL_BASED_OUTPATIENT_CLINIC_OR_DEPARTMENT_OTHER): Payer: Self-pay | Admitting: Obstetrics & Gynecology

## 2020-08-21 DIAGNOSIS — Z96652 Presence of left artificial knee joint: Secondary | ICD-10-CM | POA: Diagnosis not present

## 2020-08-21 DIAGNOSIS — Z79899 Other long term (current) drug therapy: Secondary | ICD-10-CM | POA: Diagnosis not present

## 2020-08-21 DIAGNOSIS — Z8673 Personal history of transient ischemic attack (TIA), and cerebral infarction without residual deficits: Secondary | ICD-10-CM | POA: Diagnosis not present

## 2020-08-21 DIAGNOSIS — Z85828 Personal history of other malignant neoplasm of skin: Secondary | ICD-10-CM | POA: Diagnosis not present

## 2020-08-21 DIAGNOSIS — Z87891 Personal history of nicotine dependence: Secondary | ICD-10-CM | POA: Diagnosis not present

## 2020-08-21 DIAGNOSIS — N8110 Cystocele, unspecified: Secondary | ICD-10-CM

## 2020-08-21 DIAGNOSIS — Z85818 Personal history of malignant neoplasm of other sites of lip, oral cavity, and pharynx: Secondary | ICD-10-CM | POA: Insufficient documentation

## 2020-08-21 DIAGNOSIS — Z9889 Other specified postprocedural states: Secondary | ICD-10-CM

## 2020-08-21 DIAGNOSIS — N816 Rectocele: Secondary | ICD-10-CM | POA: Diagnosis not present

## 2020-08-21 HISTORY — PX: ANTERIOR AND POSTERIOR REPAIR: SHX5121

## 2020-08-21 HISTORY — DX: Unspecified osteoarthritis, unspecified site: M19.90

## 2020-08-21 HISTORY — DX: Nontoxic multinodular goiter: E04.2

## 2020-08-21 HISTORY — DX: Pneumonia, unspecified organism: J18.9

## 2020-08-21 LAB — TYPE AND SCREEN
ABO/RH(D): O POS
Antibody Screen: NEGATIVE

## 2020-08-21 LAB — CBC
HCT: 39.9 % (ref 36.0–46.0)
Hemoglobin: 13.2 g/dL (ref 12.0–15.0)
MCH: 29.7 pg (ref 26.0–34.0)
MCHC: 33.1 g/dL (ref 30.0–36.0)
MCV: 89.9 fL (ref 80.0–100.0)
Platelets: 263 10*3/uL (ref 150–400)
RBC: 4.44 MIL/uL (ref 3.87–5.11)
RDW: 13.3 % (ref 11.5–15.5)
WBC: 7 10*3/uL (ref 4.0–10.5)
nRBC: 0 % (ref 0.0–0.2)

## 2020-08-21 LAB — POCT I-STAT, CHEM 8
BUN: 25 mg/dL — ABNORMAL HIGH (ref 8–23)
Calcium, Ion: 1.22 mmol/L (ref 1.15–1.40)
Chloride: 107 mmol/L (ref 98–111)
Creatinine, Ser: 0.9 mg/dL (ref 0.44–1.00)
Glucose, Bld: 95 mg/dL (ref 70–99)
HCT: 42 % (ref 36.0–46.0)
Hemoglobin: 14.3 g/dL (ref 12.0–15.0)
Potassium: 4.4 mmol/L (ref 3.5–5.1)
Sodium: 145 mmol/L (ref 135–145)
TCO2: 22 mmol/L (ref 22–32)

## 2020-08-21 SURGERY — ANTERIOR (CYSTOCELE) AND POSTERIOR REPAIR (RECTOCELE)
Anesthesia: General | Site: Vagina

## 2020-08-21 MED ORDER — HYDROCODONE-ACETAMINOPHEN 5-325 MG PO TABS: 1.0000 | ORAL_TABLET | ORAL | Status: DC | PRN

## 2020-08-21 MED ORDER — GENTAMICIN SULFATE 40 MG/ML IJ SOLN
400.0000 mg | INTRAVENOUS | Status: AC
  Administered 2020-08-21: 400 mg via INTRAVENOUS
  Filled 2020-08-21: qty 10

## 2020-08-21 MED ORDER — LACTATED RINGERS IV SOLN: INTRAVENOUS | Status: DC

## 2020-08-21 MED ORDER — LIDOCAINE 2% (20 MG/ML) 5 ML SYRINGE
INTRAMUSCULAR | Status: DC | PRN
  Administered 2020-08-21: 100 mg via INTRAVENOUS

## 2020-08-21 MED ORDER — LIDOCAINE 2% (20 MG/ML) 5 ML SYRINGE
INTRAMUSCULAR | Status: AC
  Filled 2020-08-21: qty 5

## 2020-08-21 MED ORDER — OXYCODONE HCL 5 MG/5ML PO SOLN: 5.0000 mg | Freq: Once | ORAL | Status: DC | PRN

## 2020-08-21 MED ORDER — FENTANYL CITRATE (PF) 250 MCG/5ML IJ SOLN
INTRAMUSCULAR | Status: AC
  Filled 2020-08-21: qty 5

## 2020-08-21 MED ORDER — ACETAMINOPHEN 325 MG PO TABS
650.0000 mg | ORAL_TABLET | ORAL | Status: DC | PRN
  Administered 2020-08-21: 650 mg via ORAL

## 2020-08-21 MED ORDER — MIDAZOLAM HCL 2 MG/2ML IJ SOLN
INTRAMUSCULAR | Status: AC
  Filled 2020-08-21: qty 2

## 2020-08-21 MED ORDER — BUPIVACAINE-EPINEPHRINE 0.5% -1:200000 IJ SOLN
INTRAMUSCULAR | Status: DC | PRN
  Administered 2020-08-21: 10 mL
  Administered 2020-08-21: 8 mL

## 2020-08-21 MED ORDER — DEXAMETHASONE SODIUM PHOSPHATE 10 MG/ML IJ SOLN
INTRAMUSCULAR | Status: AC
  Filled 2020-08-21: qty 1

## 2020-08-21 MED ORDER — ROCURONIUM BROMIDE 10 MG/ML (PF) SYRINGE
PREFILLED_SYRINGE | INTRAVENOUS | Status: DC | PRN
  Administered 2020-08-21: 60 mg via INTRAVENOUS

## 2020-08-21 MED ORDER — IBUPROFEN 800 MG PO TABS: 800.0000 mg | ORAL_TABLET | Freq: Three times a day (TID) | ORAL | 0 refills | Status: DC | PRN

## 2020-08-21 MED ORDER — ROCURONIUM BROMIDE 10 MG/ML (PF) SYRINGE
PREFILLED_SYRINGE | INTRAVENOUS | Status: AC
  Filled 2020-08-21: qty 10

## 2020-08-21 MED ORDER — DEXAMETHASONE SODIUM PHOSPHATE 10 MG/ML IJ SOLN
INTRAMUSCULAR | Status: DC | PRN
  Administered 2020-08-21: 10 mg via INTRAVENOUS

## 2020-08-21 MED ORDER — OXYCODONE HCL 5 MG PO TABS: 5.0000 mg | ORAL_TABLET | Freq: Four times a day (QID) | ORAL | 0 refills | Status: DC | PRN

## 2020-08-21 MED ORDER — FENTANYL CITRATE (PF) 100 MCG/2ML IJ SOLN
INTRAMUSCULAR | Status: DC | PRN
  Administered 2020-08-21: 50 ug via INTRAVENOUS
  Administered 2020-08-21: 100 ug via INTRAVENOUS
  Administered 2020-08-21 (×2): 50 ug via INTRAVENOUS

## 2020-08-21 MED ORDER — ONDANSETRON HCL 4 MG/2ML IJ SOLN: 4.0000 mg | Freq: Once | INTRAMUSCULAR | Status: DC | PRN

## 2020-08-21 MED ORDER — GENTAMICIN SULFATE 40 MG/ML IJ SOLN: 5.0000 mg/kg | INTRAVENOUS | Status: DC

## 2020-08-21 MED ORDER — ONDANSETRON HCL 4 MG/2ML IJ SOLN
INTRAMUSCULAR | Status: AC
  Filled 2020-08-21: qty 2

## 2020-08-21 MED ORDER — OXYCODONE-ACETAMINOPHEN 5-325 MG PO TABS
ORAL_TABLET | ORAL | Status: AC
  Filled 2020-08-21: qty 1

## 2020-08-21 MED ORDER — SUGAMMADEX SODIUM 200 MG/2ML IV SOLN
INTRAVENOUS | Status: DC | PRN
  Administered 2020-08-21: 200 mg via INTRAVENOUS

## 2020-08-21 MED ORDER — OXYCODONE-ACETAMINOPHEN 5-325 MG PO TABS
2.0000 | ORAL_TABLET | ORAL | Status: DC | PRN
  Administered 2020-08-21 (×2): 1 via ORAL

## 2020-08-21 MED ORDER — HYDROMORPHONE HCL 1 MG/ML IJ SOLN: 0.2500 mg | INTRAMUSCULAR | Status: DC | PRN

## 2020-08-21 MED ORDER — OXYCODONE-ACETAMINOPHEN 5-325 MG PO TABS
ORAL_TABLET | ORAL | Status: AC
Start: 2020-08-21 — End: ?
  Filled 2020-08-21: qty 1

## 2020-08-21 MED ORDER — POVIDONE-IODINE 10 % EX SWAB: 2.0000 "application " | Freq: Once | CUTANEOUS | Status: DC

## 2020-08-21 MED ORDER — CLINDAMYCIN PHOSPHATE 900 MG/50ML IV SOLN: 900.0000 mg | INTRAVENOUS | Status: DC

## 2020-08-21 MED ORDER — ACETAMINOPHEN 325 MG PO TABS
ORAL_TABLET | ORAL | Status: AC
  Filled 2020-08-21: qty 2

## 2020-08-21 MED ORDER — CLINDAMYCIN PHOSPHATE 900 MG/50ML IV SOLN
900.0000 mg | INTRAVENOUS | Status: AC
  Administered 2020-08-21: 900 mg via INTRAVENOUS

## 2020-08-21 MED ORDER — ACETAMINOPHEN 10 MG/ML IV SOLN: 1000.0000 mg | Freq: Once | INTRAVENOUS | Status: DC | PRN

## 2020-08-21 MED ORDER — OXYCODONE HCL 5 MG PO TABS: 5.0000 mg | ORAL_TABLET | Freq: Once | ORAL | Status: DC | PRN

## 2020-08-21 MED ORDER — PROPOFOL 10 MG/ML IV BOLUS
INTRAVENOUS | Status: AC
  Filled 2020-08-21: qty 20

## 2020-08-21 MED ORDER — ONDANSETRON HCL 4 MG/2ML IJ SOLN
INTRAMUSCULAR | Status: DC | PRN
  Administered 2020-08-21: 4 mg via INTRAVENOUS

## 2020-08-21 MED ORDER — PROPOFOL 10 MG/ML IV BOLUS
INTRAVENOUS | Status: DC | PRN
  Administered 2020-08-21: 180 mg via INTRAVENOUS

## 2020-08-21 MED ORDER — MIDAZOLAM HCL 5 MG/5ML IJ SOLN
INTRAMUSCULAR | Status: DC | PRN
  Administered 2020-08-21: 2 mg via INTRAVENOUS

## 2020-08-21 MED ORDER — CLINDAMYCIN PHOSPHATE 900 MG/50ML IV SOLN
INTRAVENOUS | Status: AC
  Filled 2020-08-21: qty 50

## 2020-08-21 SURGICAL SUPPLY — 45 items
BLADE SURG 15 STRL LF DISP TIS (BLADE) ×1 IMPLANT
BLADE SURG 15 STRL SS (BLADE) ×2
DECANTER SPIKE VIAL GLASS SM (MISCELLANEOUS) ×2 IMPLANT
ELECT REM PT RETURN 9FT ADLT (ELECTROSURGICAL) ×2
ELECTRODE REM PT RTRN 9FT ADLT (ELECTROSURGICAL) ×1 IMPLANT
GAUZE 4X4 16PLY RFD (DISPOSABLE) ×4 IMPLANT
GAUZE PACKING 1/4 X5 YD (GAUZE/BANDAGES/DRESSINGS) ×2 IMPLANT
GLOVE BIO SURGEON STRL SZ 6.5 (GLOVE) ×4 IMPLANT
GLOVE BIO SURGEON STRL SZ7 (GLOVE) ×2 IMPLANT
GLOVE BIO SURGEON STRL SZ7.5 (GLOVE) ×2 IMPLANT
GLOVE BIOGEL PI IND STRL 6.5 (GLOVE) ×2 IMPLANT
GLOVE BIOGEL PI IND STRL 7.0 (GLOVE) ×1 IMPLANT
GLOVE BIOGEL PI IND STRL 7.5 (GLOVE) ×4 IMPLANT
GLOVE BIOGEL PI INDICATOR 6.5 (GLOVE) ×2
GLOVE BIOGEL PI INDICATOR 7.0 (GLOVE) ×1
GLOVE BIOGEL PI INDICATOR 7.5 (GLOVE) ×4
GLOVE ECLIPSE 7.5 STRL STRAW (GLOVE) ×4 IMPLANT
GLOVE SURG SS PI 7.0 STRL IVOR (GLOVE) ×4 IMPLANT
GOWN STRL REUS W/ TWL LRG LVL3 (GOWN DISPOSABLE) ×2 IMPLANT
GOWN STRL REUS W/TWL LRG LVL3 (GOWN DISPOSABLE) ×12 IMPLANT
GOWN STRL REUS W/TWL XL LVL3 (GOWN DISPOSABLE) ×2 IMPLANT
KIT TURNOVER CYSTO (KITS) ×2 IMPLANT
MANIFOLD NEPTUNE II (INSTRUMENTS) ×2 IMPLANT
NEEDLE HYPO 22GX1.5 SAFETY (NEEDLE) ×2 IMPLANT
PACK VAGINAL WOMENS (CUSTOM PROCEDURE TRAY) ×2 IMPLANT
PAD PREP 24X48 CUFFED NSTRL (MISCELLANEOUS) ×2 IMPLANT
SOL PREP PROV IODINE SCRUB 4OZ (MISCELLANEOUS) ×2 IMPLANT
SPONGE LAP 4X18 RFD (DISPOSABLE) ×4 IMPLANT
SUT VIC AB 0 CT1 27 (SUTURE)
SUT VIC AB 0 CT1 27XBRD ANBCTR (SUTURE) IMPLANT
SUT VIC AB 2-0 CT1 27 (SUTURE) ×2
SUT VIC AB 2-0 CT1 TAPERPNT 27 (SUTURE) ×1 IMPLANT
SUT VIC AB 2-0 CT2 27 (SUTURE) IMPLANT
SUT VIC AB 2-0 SH 27 (SUTURE) ×14
SUT VIC AB 2-0 SH 27XBRD (SUTURE) ×7 IMPLANT
SUT VIC AB 2-0 UR5 27 (SUTURE) IMPLANT
SUT VIC AB 3-0 SH 27 (SUTURE)
SUT VIC AB 3-0 SH 27X BRD (SUTURE) IMPLANT
SUT VIC AB 4-0 SH 27 (SUTURE)
SUT VIC AB 4-0 SH 27XANBCTRL (SUTURE) IMPLANT
SUT VICRYL 0 UR6 27IN ABS (SUTURE) IMPLANT
SUT VICRYL 2 0 18  UND BR (SUTURE)
SUT VICRYL 2 0 18 UND BR (SUTURE) IMPLANT
SYR BULB IRRIG 60ML STRL (SYRINGE) ×2 IMPLANT
TRAY FOLEY W/BAG SLVR 14FR LF (SET/KITS/TRAYS/PACK) ×2 IMPLANT

## 2020-08-21 NOTE — Progress Notes (Signed)
PO 6 hours:  Anterior/Posterior Repair  Subjective: Patient reports tolerating PO and no problems voiding.    Objective: I have reviewed patient's vital signs.  vital signs, intake and output and medications.  Vitals:   08/21/20 1257 08/21/20 1400  BP: 135/70 138/68  Pulse: 70 68  Resp: 16 16  Temp: 97.8 F (36.6 C) 98 F (36.7 C)  SpO2: 96% 98%   No intake/output data recorded. Total I/O In: 3128.5 [P.O.:1000; I.V.:1968.5; IV Piggyback:160] Out: 2100 [Urine:1950; Blood:150]  Results for orders placed or performed during the hospital encounter of 08/21/20 (from the past 24 hour(s))  I-STAT, chem 8     Status: Abnormal   Collection Time: 08/21/20  7:56 AM  Result Value Ref Range   Sodium 145 135 - 145 mmol/L   Potassium 4.4 3.5 - 5.1 mmol/L   Chloride 107 98 - 111 mmol/L   BUN 25 (H) 8 - 23 mg/dL   Creatinine, Ser 0.90 0.44 - 1.00 mg/dL   Glucose, Bld 95 70 - 99 mg/dL   Calcium, Ion 1.22 1.15 - 1.40 mmol/L   TCO2 22 22 - 32 mmol/L   Hemoglobin 14.3 12.0 - 15.0 g/dL   HCT 42.0 36 - 46 %  CBC     Status: None   Collection Time: 08/21/20  8:02 AM  Result Value Ref Range   WBC 7.0 4.0 - 10.5 K/uL   RBC 4.44 3.87 - 5.11 MIL/uL   Hemoglobin 13.2 12.0 - 15.0 g/dL   HCT 39.9 36 - 46 %   MCV 89.9 80.0 - 100.0 fL   MCH 29.7 26.0 - 34.0 pg   MCHC 33.1 30.0 - 36.0 g/dL   RDW 13.3 11.5 - 15.5 %   Platelets 263 150 - 400 K/uL   nRBC 0.0 0.0 - 0.2 %  Type and screen [facility]     Status: None   Collection Time: 08/21/20  8:02 AM  Result Value Ref Range   ABO/RH(D) O POS    Antibody Screen NEG    Sample Expiration      08/24/2020,2359 Performed at [facility], [place] [address], [place], [place] [address]     EXAM General: alert and cooperative Extremities: no edema, redness or tenderness in the calves or thighs Vaginal Bleeding: minimal.  Vaginal packing removed.  Imbibed with blood, brownish.  No active bleeding.  Assessment: s/p  Procedure(s): ANTERIOR (CYSTOCELE) AND POSTERIOR REPAIR (RECTOCELE): stable, progressing well and tolerating diet  Plan: Advance diet Discharge home  LOS: 0 days    Princess Bruins, MD 08/21/2020 5:08 PM

## 2020-08-21 NOTE — Anesthesia Procedure Notes (Signed)
Procedure Name: Intubation Date/Time: 08/21/2020 8:55 AM Performed by: Zamyiah Tino D, CRNA Pre-anesthesia Checklist: Patient identified, Emergency Drugs available, Suction available and Patient being monitored Patient Re-evaluated:Patient Re-evaluated prior to induction Oxygen Delivery Method: Circle system utilized Preoxygenation: Pre-oxygenation with 100% oxygen Induction Type: IV induction Ventilation: Mask ventilation without difficulty Laryngoscope Size: Mac and 3 Grade View: Grade III Tube type: Oral Tube size: 7.0 mm Number of attempts: 1 Airway Equipment and Method: Stylet Placement Confirmation: ETT inserted through vocal cords under direct vision,  positive ETCO2 and breath sounds checked- equal and bilateral Secured at: 21 cm Tube secured with: Tape Dental Injury: Teeth and Oropharynx as per pre-operative assessment

## 2020-08-21 NOTE — Anesthesia Preprocedure Evaluation (Addendum)
Anesthesia Evaluation  Patient identified by MRN, date of birth, ID band Patient awake    Reviewed: Patient's Chart, lab work & pertinent test results  Airway Mallampati: II  TM Distance: >3 FB Neck ROM: Full    Dental  (+) Partial Upper, Partial Lower   Pulmonary neg pulmonary ROS, former smoker,    Pulmonary exam normal        Cardiovascular negative cardio ROS   Rhythm:Regular Rate:Normal     Neuro/Psych CVA, No Residual Symptoms    GI/Hepatic negative GI ROS, Neg liver ROS,   Endo/Other    Renal/GU negative Renal ROS Bladder dysfunction      Musculoskeletal  (+) Arthritis , Osteopenia. Basal cell Ca 2016, roof of mouth cancer 2016   Abdominal (+)  Abdomen: soft. Bowel sounds: normal.  Peds  Hematology Thyroid nodules, not on medication   Anesthesia Other Findings   Reproductive/Obstetrics negative OB ROS                            Anesthesia Physical Anesthesia Plan  ASA: III  Anesthesia Plan: General   Post-op Pain Management:    Induction:   PONV Risk Score and Plan: 3 and Ondansetron, Dexamethasone, Midazolam and Treatment may vary due to age or medical condition  Airway Management Planned: Mask and LMA  Additional Equipment: None  Intra-op Plan:   Post-operative Plan: Extubation in OR  Informed Consent: I have reviewed the patients History and Physical, chart, labs and discussed the procedure including the risks, benefits and alternatives for the proposed anesthesia with the patient or authorized representative who has indicated his/her understanding and acceptance.     Dental advisory given  Plan Discussed with: CRNA and Surgeon  Anesthesia Plan Comments: (Lab Results      Component                Value               Date                      WBC                      7.3                 07/05/2020                HGB                      15.2 (H)             07/05/2020                HCT                      46.9 (H)            07/05/2020                MCV                      90.5                07/05/2020                PLT  289                 07/05/2020           Covid-19 Nucleic Acid Test Results Lab Results      Component                Value               Date                      [id number]              NEGATIVE            08/17/2020          )        Anesthesia Quick Evaluation

## 2020-08-21 NOTE — Discharge Summary (Signed)
Physician Discharge Summary  Patient ID: LINZIE CRISS MRN: [id number] DOB/AGE: August 19, 1961 59 y.o.  Admit date: 08/21/2020 Discharge date: 08/21/2020  Admission Diagnoses: Postoperative state [Z98.890] Post-operative state [Z98.890]   Discharge Diagnoses:  Active Problems:   Postoperative state   Post-operative state   Discharged Condition: good  Consults:None  Significant Diagnostic Studies: None  Treatments:surgery: Anterior and Posterior Repair  Vitals:   08/21/20 1257 08/21/20 1400  BP: 135/70 138/68  Pulse: 70 68  Resp: 16 16  Temp: 97.8 F (36.6 C) 98 F (36.7 C)  SpO2: 96% 98%     Total I/O In: 3128.5 [P.O.:1000; I.V.:1968.5; IV Piggyback:160] Out: 2100 [Urine:1950; Blood:150]   Hospital Course: Good  Discharge Exam: Normal postop  Disposition: Home     Allergies as of 08/21/2020      Reactions   Influenza Vaccines Anaphylaxis, Other (See Comments)   Per patient   Penicillins Anaphylaxis, Other (See Comments)   PATIENT HAS HAD A PCN REACTION WITH IMMEDIATE RASH, FACIAL/TONGUE/THROAT SWELLING, SOB, OR LIGHTHEADEDNESS WITH HYPOTENSION:  #  #  #  YES  #  #  #   Has patient had a PCN reaction causing severe rash involving mucus membranes or skin necrosis: No PATIENT HAS HAD A PCN REACTION THAT REQUIRED HOSPITALIZATION:  #  #  #  YES  #  #  #  Has patient had a PCN reaction occurring within the last 10 years: No   Other    bandaids- skin turns red and a rach    Cortisone Other (See Comments)   Turned red and ran a low grade fever for 3 days      Medication List    TAKE these medications   ASHWAGANDHA PO Take by mouth. Pt takes 300 mg in the pm   Biotin 1000 MCG tablet Take 1,000 mcg by mouth daily.   doxycycline 50 MG capsule Commonly known as: VIBRAMYCIN Take 50 mg by mouth 2 (two) times daily. Completed on 08/12/20 for rosacea   furosemide 40 MG tablet Commonly known as: LASIX Take 1.5 tablets (60 mg total) by mouth daily.    multivitamin with minerals Tabs tablet Take 1 tablet by mouth daily.   vitamin B-12 500 MCG tablet Commonly known as: CYANOCOBALAMIN Take 500 mcg by mouth daily.   VITAMIN D3 PO Take 1,000 units of lipase by mouth daily.         Follow-up Information    Princess Bruins, MD Follow up in 3 week(s).   Specialty: Obstetrics and Gynecology Contact information: [address] [place] [address] [phone]               Signed: Princess Bruins 08/21/2020, 5:06 PM

## 2020-08-21 NOTE — Discharge Instructions (Signed)
Anterior and Posterior Colporrhaphy, Care After This sheet gives you information about how to care for yourself after your procedure. Your health care provider may also give you more specific instructions. If you have problems or questions, contact your health care provider. What can I expect after the procedure? After the procedure, it is common to have:  Pain in the surgical area.  Vaginal spotting and discharge. You will need to use a sanitary pad during this time.  Fatigue. Follow these instructions at home:  Incision care   Follow instructions from your health care provider about how to take care of your incision. Make sure you: ? Wash your hands with soap and water before touching the incision area. If soap and water are not available, use hand sanitizer. ? Clean your incision as told by your health care provider. ? Leave stitches (sutures), skin glue, or adhesive strips in place. These skin closures may need to stay in place for 2 weeks or longer. If adhesive strip edges start to loosen and curl up, you may trim the loose edges. Do not remove adhesive strips completely unless your health care provider tells you to do that.  Check your incision area every day for signs of infection. Check for: ? Redness, swelling, or pain. ? Fluid or blood. ? Warmth. ? Pus or a bad smell.  Check your incision every day to make sure the incision area is not separating or opening up.  Do not take baths, swim, or use a hot tub until your health care provider approves. You may shower.  Keep the area between your vagina and rectum (perineal area) clean and dry. Make sure you clean the area after every bowel movement and each time you urinate.  Ask your health care provider if you can take a sitz bath or sit in a tub of clean, warm water. Activity  Take frequent, short walks followed by rest periods throughout the day.  Avoid activities that take a lot of effort (are strenuous).  Do not lift  anything that is heavier than 10 lb (4.5 kg), or the limit that your health care provider tells you, until he or she says that it is safe. Avoid pushing or pulling motions.  Avoid standing for long periods of time.  Do not douche, use tampons, or have sex until your health care provider says it is okay.  Return to your normal activities as told by your health care provider. Ask your health care provider what activities are safe for you.  Do not drive until your health care provider approves. To prevent constipation To prevent or treat constipation while you are taking prescription pain medicine, your health care provider may recommend that you:  Take over-the-counter or prescription medicines.  Eat foods that are high in fiber, such as fresh fruits and vegetables, whole grains, and beans.  Drink enough fluid to keep your urine clear or pale yellow.  Limit foods that are high in fat and processed sugars, such as fried and sweet foods. General instructions  Take over-the-counter and prescription medicines only as told by your health care provider.  You may be instructed to do pelvic floor exercises (kegels) as told by your health care provider.  Do not drive or use heavy machinery while taking prescription pain medicine.  Wear compression stockings as told by your health care provider. These stockings help to prevent blood clots and reduce swelling in your legs.  Keep all follow-up visits as told by your health care provider.  This is important. Contact a health care provider if:  Medicine does not help your pain.  You have frequent or urgent urination, or you are unable to completely empty your bladder.  You feel a burning sensation when urinating.  You have pus or a bad smell coming from your vaginal area.  You have redness, swelling, or increasing pain in the vaginal area. Get help right away if:  You have increased bleeding from the vaginal area.  You cannot  urinate.  You have a fever or chills.  Your incision separates or opens.  You have trouble breathing. Summary  After the procedure, it is common to have pain, fatigue, spotting, and discharge from the vagina.  Keep the area between your vagina and rectum (perineal area) clean and dry. Make sure you clean the area after every bowel movement and each time you urinate.  Follow instructions from your health care provider about any activity restrictions after the procedure. This information is not intended to replace advice given to you by your health care provider. Make sure you discuss any questions you have with your health care provider. Document Revised: 10/01/2017 Document Reviewed: 11/26/2016 Elsevier Patient Education  2020 [employer].

## 2020-08-21 NOTE — Op Note (Signed)
Operative Note  08/21/2020  11:15 AM  PATIENT:  Miranda Perkins  59 y.o. female  PRE-OPERATIVE DIAGNOSIS:  Cystocele grade 2/4, Rectocele grade 2/3  POST-OPERATIVE DIAGNOSIS:  Cystocele grade 2/4, Rectocele grade 2/3  PROCEDURE:  Procedure(s): ANTERIOR (CYSTOCELE) AND POSTERIOR (RECTOCELE) REPAIR   SURGEON:  Surgeon(s): Joseph Pierini, MD Princess Bruins, MD  ANESTHESIA:   general  FINDINGS: No uterine prolapse.  Cystocele grade 2/4 and Rectocele grade 2/3.  DESCRIPTION OF OPERATION: Under general anesthesia with endotracheal intubation the patient is in lithotomy position.  The patient is prepped with Betadine on the suprapubic, vulvar and vaginal areas.  She is draped as usual.  Timeout is done.  The Foley is inserted in the bladder.  The vaginal exam reveals an anteverted uterus, normal volume, mobile.  No uterine prolapse at all.  No adnexal mass.  A cystocele grade 2/4 is present with a rectocele grade 2/3.        We start with the cystocele repair.  Infiltration of lidocaine 1% with epinephrine at the midline on the anterior vagina.  We use Allises to hold the vaginal mucosa.  We opened the vaginal mucosa with the scalpel and then separate the mucosa from the fascia with scissors.  We then opened the vaginal mucosa longitudinally in the midline.  The fascia is separated on each side from the vaginal mucosa.  We then reduce the cystocele with separate stitches of Vicryl 2-0.  We trimmed the excess vaginal mucosa and closed the mucosa with a running suture of Vicryl 2-0.  Hemostasis is adequate.       We then proceeded with the rectocele repair.  The vaginal mucosa is infiltrated with lidocaine 1% with epinephrine on the midline posteriorly.  We then use the scalp L to open the vaginal mucosa and separated the fascia from the mucosa with scissors.  We open the vaginal mucosa longitudinally posteriorly.  We then separated the fascia from the vaginal mucosa all around.  We reduced  the rectocele with separate stitches of Vicryl 2-0.  We then trimmed the excess vaginal mucosa and closed the vaginal mucosa with a running suture of Vicryl 2-0.  Hemostasis was adequate at all levels.  Bovie coagulation was used as needed.  We packed the vagina with 1/4 inch plain pack.  The patient was brought to recovery room in good and stable status.  ESTIMATED BLOOD LOSS: 150 mL   Intake/Output Summary (Last 24 hours) at 08/21/2020 1115 Last data filed at 08/21/2020 1105 Gross per 24 hour  Intake 1110 ml  Output 300 ml  Net 810 ml     BLOOD ADMINISTERED:none   LOCAL MEDICATIONS USED: Lidocaine 1% with Epinephrine  SPECIMEN:  Source of Specimen:  None  DISPOSITION OF SPECIMEN:  N/A  COUNTS:  YES  PLAN OF CARE: Transfer to PACU  Marie-Lyne LavoieMD11:15 AM

## 2020-08-21 NOTE — Transfer of Care (Signed)
Immediate Anesthesia Transfer of Care Note  Patient: Miranda Perkins  Procedure(s) Performed: ANTERIOR (CYSTOCELE) AND POSTERIOR REPAIR (RECTOCELE) (N/A Vagina )  Patient Location: PACU  Anesthesia Type:General  Level of Consciousness: awake, alert  and oriented  Airway & Oxygen Therapy: Patient Spontanous Breathing and Patient connected to nasal cannula oxygen  Post-op Assessment: Report given to RN and Post -op Vital signs reviewed and stable  Post vital signs: Reviewed and stable  Last Vitals:  Vitals Value Taken Time  BP    Temp    Pulse    Resp 16 08/21/20 1122  SpO2    Vitals shown include unvalidated device data.  Last Pain:  Vitals:   08/21/20 0725  TempSrc: Oral  PainSc: 0-No pain         Complications: No complications documented.

## 2020-08-21 NOTE — H&P (Signed)
Miranda Perkins is an 59 y.o. female.[id number] Married  RP: Cysto-Rectocele repair scheduled on 08/21/2020  HPI: No change x visit 08/14/2020 when we noted:Worsening vaginal bulging which is not protruding at the vulva for about a year. Patient is having a lot of discomfort and pain. Cannot do her regular walking activities or any fitness. Difficulty emptying her bladder well. Able to pass her bowel movements without pushing in the vagina. Patient would like to be sexually active with husband, but the bulging is preventing her to do so. Post menopause, well on no hormone replacement therapy. No postmenopausal bleeding.  Pertinent Gynecological History: Blood transfusions: none Sexually transmitted diseases: no past history Previous GYN Procedures: TL  Last mammogram: normal  Last pap: normal    Menstrual History:  No LMP recorded. Patient is postmenopausal.    Past Medical History:  Diagnosis Date  . Arthritis   . Cancer ([place]) 2016   basal cell, forehead  . 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 Depression    pt denies   . Multiple thyroid nodules   . Osteopenia 12/2017   T score -1.2 FRAX 4.9% / 0.3%  . Pneumonia    hx of x 2 as a child   . Stroke ([place]) 1993   while on Chloramphenicol, no residuaL; [MEDICATION WAS GIVEN TO TREAT TICK BITE] ; patient states  "i stroked out right next to my doctor , he said i passed out and that was my only symptom" ;     Past Surgical History:  Procedure Laterality Date  . ANTERIOR CERVICAL DECOMP/DISCECTOMY FUSION N/A 09/27/2017   Procedure: ANTERIOR CERVICAL DECOMPRESSION/DISCECTOMY FUSION CERVICAL FOUR-FIVE ,CERVICAL FIVE-SIX,CERVICAL SIX-SEVEN;  Surgeon: Kary Kos, MD;  Location: [facility];  Service: Neurosurgery;  Laterality: N/A;  . CARPAL TUNNEL RELEASE Left 09/27/2017   Procedure: CARPAL TUNNEL RELEASE;  Surgeon: Kary Kos, MD;  Location: [facility];  Service: Neurosurgery;  Laterality: Left;  . FOOT SURGERY     bone removed from  pinky toe  . HERNIA REPAIR    . KNEE SURGERY Left 06/21/2017   dr Rushie Nyhan; meniscus tear   . LEG SURGERY  1972  . MOUTH SURGERY    . skin cancer removal    . TONSILLECTOMY     1979?  . TOTAL KNEE ARTHROPLASTY Left 01/26/2018   Procedure: LEFT TOTAL KNEE ARTHROPLASTY;  Surgeon: Latanya Maudlin, MD;  Location: [facility];  Service: Orthopedics;  Laterality: Left;  . TUBAL LIGATION      Family History  Problem Relation Age of Onset  . Rheum arthritis Sister   . Clotting disorder Sister   . Rheum arthritis Sister   . Thyroid disease Sister   . Clotting disorder Maternal Aunt   . Clotting disorder Maternal Aunt   . Heart Problems Mother   . Rheum arthritis Mother   . Cancer Father        ? type  . Cancer Paternal Grandfather        Brain cancer    Social History:  reports that she quit smoking about 10 years ago. Her smoking use included cigarettes. She smoked 0.50 packs per day. She has never used smokeless tobacco. She reports current alcohol use. She reports that she does not use drugs.  Allergies:  Allergies  Allergen Reactions  . Influenza Vaccines Anaphylaxis and Other (See Comments)    Per patient  . Penicillins Anaphylaxis and Other (See Comments)    PATIENT HAS HAD A PCN REACTION WITH  IMMEDIATE RASH, FACIAL/TONGUE/THROAT SWELLING, SOB, OR LIGHTHEADEDNESS WITH HYPOTENSION:  #  #  #  YES  #  #  #   Has patient had a PCN reaction causing severe rash involving mucus membranes or skin necrosis: No PATIENT HAS HAD A PCN REACTION THAT REQUIRED HOSPITALIZATION:  #  #  #  YES  #  #  #  Has patient had a PCN reaction occurring within the last 10 years: No   . Other     bandaids- skin turns red and a rach   . Cortisone Other (See Comments)    Turned red and ran a low grade fever for 3 days    Medications Prior to Admission  Medication Sig Dispense Refill Last Dose  . ASHWAGANDHA PO Take by mouth. Pt takes 300 mg in the pm   Past Month at Unknown time  . Biotin 1000 MCG tablet  Take 1,000 mcg by mouth daily.    Past Month at Unknown time  . Cholecalciferol (VITAMIN D3 PO) Take 1,000 units of lipase by mouth daily.    Past Month at Unknown time  . doxycycline (VIBRAMYCIN) 50 MG capsule Take 50 mg by mouth 2 (two) times daily. Completed on 08/12/20 for rosacea   Past Month at Unknown time  . furosemide (LASIX) 40 MG tablet Take 1.5 tablets (60 mg total) by mouth daily. 135 tablet 3 Past Week at Unknown time  . Multiple Vitamin (MULTIVITAMIN WITH MINERALS) TABS tablet Take 1 tablet by mouth daily.   Past Month at Unknown time  . vitamin B-12 (CYANOCOBALAMIN) 500 MCG tablet Take 500 mcg by mouth daily.   Past Month at Unknown time    REVIEW OF SYSTEMS: A ROS was performed and pertinent positives and negatives are included in the history.  GENERAL: No fevers or chills. HEENT: No change in vision, no earache, sore throat or sinus congestion. NECK: No pain or stiffness. CARDIOVASCULAR: No chest pain or pressure. No palpitations. PULMONARY: No shortness of breath, cough or wheeze. GASTROINTESTINAL: No abdominal pain, nausea, vomiting or diarrhea, melena or bright red blood per rectum. GENITOURINARY: No urinary frequency, urgency, hesitancy or dysuria. MUSCULOSKELETAL: No joint or muscle pain, no back pain, no recent trauma. DERMATOLOGIC: No rash, no itching, no lesions. ENDOCRINE: No polyuria, polydipsia, no heat or cold intolerance. No recent change in weight. HEMATOLOGICAL: No anemia or easy bruising or bleeding. NEUROLOGIC: No headache, seizures, numbness, tingling or weakness. PSYCHIATRIC: No depression, no loss of interest in normal activity or change in sleep pattern.     Blood pressure (!) 140/93, pulse 73, temperature 98 F (36.7 C), temperature source Oral, resp. rate 18, height 5\' 6"  (1.676 m), weight 108 kg, SpO2 100 %.  Physical Exam:   Results for orders placed or performed during the hospital encounter of 08/21/20 (from the past 24 hour(s))  I-STAT, chem 8      Status: Abnormal   Collection Time: 08/21/20  7:56 AM  Result Value Ref Range   Sodium 145 135 - 145 mmol/L   Potassium 4.4 3.5 - 5.1 mmol/L   Chloride 107 98 - 111 mmol/L   BUN 25 (H) 8 - 23 mg/dL   Creatinine, Ser 0.90 0.44 - 1.00 mg/dL   Glucose, Bld 95 70 - 99 mg/dL   Calcium, Ion 1.22 1.15 - 1.40 mmol/L   TCO2 22 22 - 32 mmol/L   Hemoglobin 14.3 12.0 - 15.0 g/dL   HCT 42.0 36 - 46 %  CBC  Status: None   Collection Time: 08/21/20  8:02 AM  Result Value Ref Range   WBC 7.0 4.0 - 10.5 K/uL   RBC 4.44 3.87 - 5.11 MIL/uL   Hemoglobin 13.2 12.0 - 15.0 g/dL   HCT 39.9 36 - 46 %   MCV 89.9 80.0 - 100.0 fL   MCH 29.7 26.0 - 34.0 pg   MCHC 33.1 30.0 - 36.0 g/dL   RDW 13.3 11.5 - 15.5 %   Platelets 263 150 - 400 K/uL   nRBC 0.0 0.0 - 0.2 %   Covid Negative   General appearance:  Normal  Gyn exam 07/18/2020: Cystocele grade 2/4. Rectocele grade 2/3. No uterine prolapse.   Assessment/Plan:  59 y.o. [id number]   1. Cystocele with rectocele Counseling on pessary management done, patient declined.  Counseling on surgeries for cystocele and rectocele. Given the absence of uterine prolapse, decisionto preserve the uterus andproceed with Anterior/Posterior repair.Information given to the patient.  Preparation before surgery reviewed with patient.  Procedure described and surgical risks and benefits thoroughly reviewed as described below.  Postop precautions and expectations discussed.  Patient voiced understanding and agreement with plans.                        Patient was counseled as to the risk of surgery to include the following:  1. Infection (prohylactic antibiotics will be administered)  2. DVT/Pulmonary Embolism (prophylactic pneumo compression stockings will be used)  3.Trauma to internal organs requiring additional surgical procedure to repair any injury to internal organs requiring perhaps additional hospitalization days.  4.Hemmorhage requiring transfusion  and blood products which carry risks such as anaphylactic reaction, hepatitis and AIDS  Patient had received literature information on the procedure scheduled and all her questions were answered and fully accepts all risk.   Marie-Lyne Dawnya Grams 08/21/2020, 8:26 AM

## 2020-08-21 NOTE — Progress Notes (Signed)
Dr Dellis Filbert at bedside to remove vaginal packing.  Packing was removed and the patient tolerated this well.

## 2020-08-21 NOTE — Anesthesia Postprocedure Evaluation (Signed)
Anesthesia Post Note  Patient: CATALEIA GADE  Procedure(s) Performed: ANTERIOR (CYSTOCELE) AND POSTERIOR REPAIR (RECTOCELE) (N/A Vagina )     Patient location during evaluation: PACU Anesthesia Type: General Level of consciousness: awake and alert Pain management: pain level controlled Vital Signs Assessment: post-procedure vital signs reviewed and stable Respiratory status: spontaneous breathing, nonlabored ventilation, respiratory function stable and patient connected to nasal cannula oxygen Cardiovascular status: blood pressure returned to baseline and stable Postop Assessment: no apparent nausea or vomiting Anesthetic complications: no   No complications documented.  Last Vitals:  Vitals:   08/21/20 1226 08/21/20 1257  BP: 126/72 135/70  Pulse: 72 70  Resp:  16  Temp: 36.8 C 36.6 C  SpO2: 97% 96%    Last Pain:  Vitals:   08/21/20 1226  TempSrc:   PainSc: 3                  Carlei Huang P Ranell Skibinski

## 2020-08-22 ENCOUNTER — Encounter (HOSPITAL_BASED_OUTPATIENT_CLINIC_OR_DEPARTMENT_OTHER): Payer: Self-pay | Admitting: Obstetrics & Gynecology

## 2020-08-22 ENCOUNTER — Telehealth: Payer: Self-pay | Admitting: *Deleted

## 2020-08-22 NOTE — Telephone Encounter (Signed)
Patient called post Cysto-Rectocele repair on 08/21/20 called this am stating the left side of her face is lightly swollen and tear duct has been "tearing up" per patient. She reports no vision problems with left eye, no other complains patient has taking any pain medication yet. No fever.  Please advise

## 2020-08-22 NOTE — Telephone Encounter (Signed)
Apply cold compresses where swollen 3 times a days for 2 days.   Ibuprofen as needed.  Call back for evaluation if worsening.

## 2020-08-22 NOTE — Telephone Encounter (Signed)
Patient informed with below note. 

## 2020-09-13 ENCOUNTER — Other Ambulatory Visit: Payer: Self-pay

## 2020-09-13 ENCOUNTER — Ambulatory Visit (INDEPENDENT_AMBULATORY_CARE_PROVIDER_SITE_OTHER): Admitting: Obstetrics & Gynecology

## 2020-09-13 ENCOUNTER — Encounter: Payer: Self-pay | Admitting: Obstetrics & Gynecology

## 2020-09-13 VITALS — BP 130/86

## 2020-09-13 DIAGNOSIS — B373 Candidiasis of vulva and vagina: Secondary | ICD-10-CM

## 2020-09-13 DIAGNOSIS — Z09 Encounter for follow-up examination after completed treatment for conditions other than malignant neoplasm: Secondary | ICD-10-CM

## 2020-09-13 DIAGNOSIS — B3731 Acute candidiasis of vulva and vagina: Secondary | ICD-10-CM

## 2020-09-13 MED ORDER — FLUCONAZOLE 150 MG PO TABS: 150.0000 mg | ORAL_TABLET | Freq: Every day | ORAL | 0 refills | Status: AC

## 2020-09-13 NOTE — Progress Notes (Signed)
    TMYA WIGINGTON Feb 17, 1961 [id number]        59 y.o.  [id number]   RP: 3 wks Postop Ant/Post repairs  HPI:  Doing very well.  Minimal brownish vaginal d/c.  No vaginal pain.  No bulging anymore.  No fever.  Urine/BMs normal.    OB History  Gravida Para Term Preterm AB Living  4 3     1 2   SAB TAB Ectopic Multiple Live Births  1            # Outcome Date GA Lbr Len/2nd Weight Sex Delivery Anes PTL Lv  4 SAB           3 Para           2 Para           1 Para             Past medical history,surgical history, problem list, medications, allergies, family history and social history were all reviewed and documented in the EPIC chart.   Directed ROS with pertinent positives and negatives documented in the history of present illness/assessment and plan.  Exam:  Vitals:   09/13/20 1445  BP: 130/86   General appearance:  Normal  Abdomen: Normal  Gynecologic exam: Vulva normal.  Speculum:  Lines of sutures intact.  No bleeding.  Increased vaginal d/c.  Bimanual exam:  Mild induration on posterior line of sutures.  Very good support post repair.   Assessment/Plan:  59 y.o. [id number]   1. Status post gynecological surgery, follow-up exam Excellent healing post anterior and posterior repairs.  No complication.  Good correction of cystocele and rectocele.  Recommend no intercourse for another 4 weeks.  2. Yeast vaginitis Yeast vaginitis clinically.  Will treat with fluconazole 150 mg daily per mouth for 3 days.  Usage reviewed and prescription sent to pharmacy.  Other orders - fluconazole (DIFLUCAN) 150 MG tablet; Take 1 tablet (150 mg total) by mouth daily for 3 days.  Princess Bruins MD, 3:16 PM 09/13/2020

## 2020-09-15 ENCOUNTER — Encounter: Payer: Self-pay | Admitting: Obstetrics & Gynecology

## 2020-10-04 ENCOUNTER — Encounter: Admitting: Obstetrics & Gynecology

## 2020-10-16 ENCOUNTER — Ambulatory Visit (INDEPENDENT_AMBULATORY_CARE_PROVIDER_SITE_OTHER): Admitting: Obstetrics & Gynecology

## 2020-10-16 ENCOUNTER — Encounter: Payer: Self-pay | Admitting: Obstetrics & Gynecology

## 2020-10-16 ENCOUNTER — Other Ambulatory Visit: Payer: Self-pay

## 2020-10-16 VITALS — BP 126/84 | Ht 64.0 in | Wt 245.0 lb

## 2020-10-16 DIAGNOSIS — Z6841 Body Mass Index (BMI) 40.0 and over, adult: Secondary | ICD-10-CM

## 2020-10-16 DIAGNOSIS — Z780 Asymptomatic menopausal state: Secondary | ICD-10-CM | POA: Diagnosis not present

## 2020-10-16 DIAGNOSIS — M8588 Other specified disorders of bone density and structure, other site: Secondary | ICD-10-CM

## 2020-10-16 DIAGNOSIS — Z01419 Encounter for gynecological examination (general) (routine) without abnormal findings: Secondary | ICD-10-CM | POA: Diagnosis not present

## 2020-10-16 DIAGNOSIS — L678 Other hair color and hair shaft abnormalities: Secondary | ICD-10-CM | POA: Diagnosis not present

## 2020-10-16 NOTE — Progress Notes (Signed)
Miranda Perkins 1961-01-25 [id number]   History:    59 y.o. [id number] Married.  Daughter Adonis Brook deceased.  RP:  Established patient presenting for annual gyn exam   HPI: Postmenopause, well on no HRT.  No PMB.  No pelvic pain.  Abstinent.  Noticed increased facial hair progressively over last few years.  Very happy without recurrence post A/P repair of Cysto-Rectocele in 08/2020.  Urine/BMs normal.  Breasts normal.  Will schedule a screening mammo and a colonoscopy and do her health labs through her Fam MD.  BMI 42.05.    Past medical history,surgical history, family history and social history were all reviewed and documented in the EPIC chart.  Gynecologic History No LMP recorded. Patient is postmenopausal.  Obstetric History OB History  Gravida Para Term Preterm AB Living  4 3     1 2   SAB IAB Ectopic Multiple Live Births  1            # Outcome Date GA Lbr Len/2nd Weight Sex Delivery Anes PTL Lv  4 SAB           3 Para           2 Para           1 Para              ROS: A ROS was performed and pertinent positives and negatives are included in the history.  GENERAL: No fevers or chills. HEENT: No change in vision, no earache, sore throat or sinus congestion. NECK: No pain or stiffness. CARDIOVASCULAR: No chest pain or pressure. No palpitations. PULMONARY: No shortness of breath, cough or wheeze. GASTROINTESTINAL: No abdominal pain, nausea, vomiting or diarrhea, melena or bright red blood per rectum. GENITOURINARY: No urinary frequency, urgency, hesitancy or dysuria. MUSCULOSKELETAL: No joint or muscle pain, no back pain, no recent trauma. DERMATOLOGIC: No rash, no itching, no lesions. ENDOCRINE: No polyuria, polydipsia, no heat or cold intolerance. No recent change in weight. HEMATOLOGICAL: No anemia or easy bruising or bleeding. NEUROLOGIC: No headache, seizures, numbness, tingling or weakness. PSYCHIATRIC: No depression, no loss of interest in normal activity or change in  sleep pattern.     Exam:   BP 126/84   Ht 5\' 4"  (1.626 m)   Wt 245 lb (111.1 kg)   BMI 42.05 kg/m   Body mass index is 42.05 kg/m.  General appearance : Well developed well nourished female. No acute distress HEENT: Eyes: no retinal hemorrhage or exudates,  Neck supple, trachea midline, no carotid bruits, no thyroidmegaly Lungs: Clear to auscultation, no rhonchi or wheezes, or rib retractions  Heart: Regular rate and rhythm, no murmurs or gallops Breast:Examined in sitting and supine position were symmetrical in appearance, no palpable masses or tenderness,  no skin retraction, no nipple inversion, no nipple discharge, no skin discoloration, no axillary or supraclavicular lymphadenopathy Abdomen: no palpable masses or tenderness, no rebound or guarding Extremities: no edema or skin discoloration or tenderness  Pelvic: Vulva: Normal             Vagina: No gross lesions or discharge  Cervix: No gross lesions or discharge  Uterus  AV, normal size, shape and consistency, non-tender and mobile  Adnexa  Without masses or tenderness  Anus: Normal   Assessment/Plan:  59 y.o. female for annual exam   1. Well female exam with routine gynecological exam Normal gynecologic exam in menopause.  No indication for Pap test this year.  Breast exam normal.  Patient will schedule a screening mammogram now.  Schedule colonoscopy as well.  Health labs with family DO.  2. Postmenopause Well on no hormone replacement therapy.  No postmenopausal bleeding.  3. Abnormal facial hair Increased facial hair.  No hirsutism otherwise.  No acne.  Will verify total testosterone level and free testosterone, as well as SHBG. - Testos,Total,Free and SHBG (Female)  4. Osteopenia of lumbar spine Will repeat a BD at 1 yrs next year.  Vitamin D supplements, calcium intake of 1500 mg daily and regular weightbearing physical activities.  5. Class 3 severe obesity due to excess calories with serious comorbidity  and body mass index (BMI) of 40.0 to 44.9 in adult [facility]) Recommend a lower calorie/carb diet.  Aerobic activities 5 times a week and light weightlifting every 2 days.  Other orders - doxycycline (VIBRAMYCIN) 50 MG capsule; Take 50 mg by mouth 2 (two) times daily.  Princess Bruins MD, 12:41 PM 10/16/2020

## 2020-10-20 LAB — TESTOS,TOTAL,FREE AND SHBG (FEMALE)
Free Testosterone: 8 pg/mL — ABNORMAL HIGH (ref 0.1–6.4)
Sex Hormone Binding: 25 nmol/L (ref 14–73)
Testosterone, Total, LC-MS-MS: 63 ng/dL — ABNORMAL HIGH (ref 2–45)

## 2020-10-23 ENCOUNTER — Other Ambulatory Visit: Payer: Self-pay

## 2020-10-23 DIAGNOSIS — R7989 Other specified abnormal findings of blood chemistry: Secondary | ICD-10-CM

## 2020-10-24 ENCOUNTER — Other Ambulatory Visit: Payer: Self-pay | Admitting: Obstetrics & Gynecology

## 2020-10-24 ENCOUNTER — Encounter: Payer: Self-pay | Admitting: Obstetrics & Gynecology

## 2020-10-24 ENCOUNTER — Telehealth: Payer: Self-pay | Admitting: *Deleted

## 2020-10-24 DIAGNOSIS — R7989 Other specified abnormal findings of blood chemistry: Secondary | ICD-10-CM

## 2020-10-24 DIAGNOSIS — E279 Disorder of adrenal gland, unspecified: Secondary | ICD-10-CM

## 2020-10-24 NOTE — Telephone Encounter (Signed)
Order placed at [facility], number given to patient to call and schedule.  

## 2020-10-24 NOTE — Telephone Encounter (Signed)
-----   Message from Ramond Craver, [place] sent at 10/23/2020 11:23 AM EST ----- Regarding: needs abd u/s Per Dr. Marguerita Merles  "Please schedule an Abdominal U/S to verify the adrenal glands at a radiology center. "

## 2020-10-30 NOTE — Telephone Encounter (Signed)
Patient scheduled on 09/04/21 

## 2020-11-07 ENCOUNTER — Encounter: Payer: Self-pay | Admitting: Family Medicine

## 2020-11-14 ENCOUNTER — Ambulatory Visit
Admission: RE | Admit: 2020-11-14 | Discharge: 2020-11-14 | Disposition: A | Discharge status: Home / Self Care | Source: Ambulatory Visit | Attending: Obstetrics & Gynecology | Admitting: Obstetrics & Gynecology

## 2020-11-14 ENCOUNTER — Telehealth: Payer: Self-pay | Admitting: *Deleted

## 2020-11-14 ENCOUNTER — Other Ambulatory Visit

## 2020-11-14 DIAGNOSIS — R7989 Other specified abnormal findings of blood chemistry: Secondary | ICD-10-CM

## 2020-11-14 DIAGNOSIS — E279 Disorder of adrenal gland, unspecified: Secondary | ICD-10-CM

## 2020-11-14 NOTE — Telephone Encounter (Signed)
Order placed at [facility] number given to patient to call to schedule.

## 2020-11-14 NOTE — Telephone Encounter (Signed)
-----   Message from Ramond Craver, [place] sent at 11/14/2020 10:58 AM EST ----- Regarding: CT scan Per Dr. Marguerita Merles "Abdominal US wnl, but Adrenals not well visualized.  Please complete the investigation with an Abdominal CT with contrast to visualize the Adrenals better. "  Patient knows. Thanks

## 2020-11-20 ENCOUNTER — Other Ambulatory Visit: Payer: Self-pay | Admitting: Family Medicine

## 2020-11-20 DIAGNOSIS — Z1231 Encounter for screening mammogram for malignant neoplasm of breast: Secondary | ICD-10-CM

## 2020-11-20 NOTE — Telephone Encounter (Signed)
CT scan scheduled on 11/29/20

## 2020-11-29 ENCOUNTER — Ambulatory Visit
Admission: RE | Admit: 2020-11-29 | Discharge: 2020-11-29 | Disposition: A | Discharge status: Home / Self Care | Source: Ambulatory Visit | Attending: Obstetrics & Gynecology | Admitting: Obstetrics & Gynecology

## 2020-11-29 DIAGNOSIS — E279 Disorder of adrenal gland, unspecified: Secondary | ICD-10-CM

## 2020-11-29 MED ORDER — IOPAMIDOL (ISOVUE-300) INJECTION 61%
100.0000 mL | Freq: Once | INTRAVENOUS | Status: AC | PRN
  Administered 2020-11-29: 100 mL via INTRAVENOUS

## 2020-12-04 ENCOUNTER — Telehealth: Payer: Self-pay | Admitting: Cardiology

## 2020-12-04 NOTE — Telephone Encounter (Signed)
Pt recently had abd CT to check adrenal glands and noticed on the result noted aortic atherosclerosis(aware of what this means) and wanted to make Dr Harl Bowie aware that she was concerned about this and that for the last few weeks she has noticed palpitations and some chest pain that happens once a week and last about 5 mins at a time - denies dizziness/swelling - does have some SOB when she is walking a long distance

## 2020-12-04 NOTE — Telephone Encounter (Signed)
Pt voiced understanding and appreciative - will call back tomorrow to schedule with extender she was driving and did not have her calendar

## 2020-12-04 NOTE — Telephone Encounter (Signed)
Not uncommon middle age or older to start developing some plaque in the aorta. There is no major clinical concern, just means we need to keep any risk factors for progression of plaque well controlled such as HTN, DM, cholesterol, smoking etc. As far as her symptoms can she get a PA appt1-2 weeks  Zandra Abts MD

## 2020-12-04 NOTE — Telephone Encounter (Signed)
Per phone call from pt- she's calling with concerns about her results from her CT Abd w/ CM that she had on 1/28  She would like Dr. Harl Bowie to review those results and give her a call about what needs to be done.   Please [phone]

## 2020-12-18 NOTE — Progress Notes (Signed)
Cardiology Office Note  Date: 12/19/2020   ID: Miranda Perkins, DOB December 27, 1960, MRN [id number]  PCP:  [name], DO  Cardiologist:  Carlyle Dolly, MD Electrophysiologist:  None   Chief Complaint: Palpitations / SOB / LE edema  History of Present Illness: Miranda Perkins is a 60 y.o. female with a history of palpitations, SOB/DOE, LE edema.  Last encounter with Dr. Harl Bowie 07/09/2020 with complaints of shortness of breath and leg edema starting 4 months prior.  Increased DOE with activity.[name]  Positive for orthopnea.  Former smoker x30 years.  On HCTZ x1 month without improvement.  At previous visit HCTZ was stopped and she was started on Lasix 40 mg daily.  Did not have much weight loss.  Increase Lasix to 60 mg daily.  Doing well with current dosing of Lasix.  Swelling was controlled and continuing with current Lasix dose.  Patient complaining of recent palpitations, described as fluttering sensation sometimes lasting up to a minute or so.  Also complaining of increased shortness of breath/dyspnea on exertion over the last 5 to 7 months which seems to be progressing.  Also complaining of some orthopnea.  Complaining of chest pain occurring at rest and with activity radiating to left arm.  States she has thyroid nodules and has not had any recent thyroid lab work.  She denies any orthostatic symptoms, CVA or TIA-like symptoms.  Denies any claudication-like symptoms, DVT or PE-like symptoms, or lower extremity edema.  States she has some limited movement in her left leg due to prior knee replacement surgery.  She recently saw her OB/GYN who discovered she had elevated levels of testosterone.  She had a subsequent abdominal study to evaluate adrenals.  Abdominal ultrasound discovered hepatic steatosis.  Normal caliber adrenal glands.  Subsequent CT scan of abdomen for evaluation of adrenals was ordered ordered for further evaluation.  Past Medical History:  Diagnosis Date  .  Arthritis   . 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 Depression    pt denies   . Multiple thyroid nodules   . Osteopenia 12/2017   T score -1.2 FRAX 4.9% / 0.3%  . Pneumonia    hx of x 2 as a child   . Stroke ([place]) 1993   while on Chloramphenicol, no residuaL; [MEDICATION WAS GIVEN TO TREAT TICK BITE] ; patient states  "i stroked out right next to my doctor , he said i passed out and that was my only symptom" ;     Past Surgical History:  Procedure Laterality Date  . ANTERIOR AND POSTERIOR REPAIR N/A 08/21/2020   Procedure: ANTERIOR (CYSTOCELE) AND POSTERIOR REPAIR (RECTOCELE);  Surgeon: Princess Bruins, MD;  Location: [facility];  Service: Gynecology;  Laterality: N/A;  requesting 9:00am OR time  requests one hour  . ANTERIOR CERVICAL DECOMP/DISCECTOMY FUSION N/A 09/27/2017   Procedure: ANTERIOR CERVICAL DECOMPRESSION/DISCECTOMY FUSION CERVICAL FOUR-FIVE ,CERVICAL FIVE-SIX,CERVICAL SIX-SEVEN;  Surgeon: Kary Kos, MD;  Location: [place] [facility];  Service: Neurosurgery;  Laterality: N/A;  . CARPAL TUNNEL RELEASE Left 09/27/2017   Procedure: CARPAL TUNNEL RELEASE;  Surgeon: Kary Kos, MD;  Location: [place];  Service: Neurosurgery;  Laterality: Left;  . FOOT SURGERY     bone removed from pinky toe  . HERNIA REPAIR    . KNEE SURGERY Left 06/21/2017   dr Rushie Nyhan; meniscus tear   . LEG SURGERY  1972  . MOUTH SURGERY    .  skin cancer removal    . TONSILLECTOMY     1979?  . TOTAL KNEE ARTHROPLASTY Left 01/26/2018   Procedure: LEFT TOTAL KNEE ARTHROPLASTY;  Surgeon: Latanya Maudlin, MD;  Location: [facility];  Service: Orthopedics;  Laterality: Left;  . TUBAL LIGATION      Current Outpatient Medications  Medication Sig Dispense Refill  . doxycycline (VIBRAMYCIN) 50 MG capsule Take 50 mg by mouth 2 (two) times daily. Intermittent for rosacea    . furosemide (LASIX) 40 MG tablet Take 1.5 tablets (60 mg total) by mouth daily. 135  tablet 3  . ibuprofen (ADVIL) 800 MG tablet Take 1 tablet (800 mg total) by mouth every 8 (eight) hours as needed. 30 tablet 0   No current facility-administered medications for this visit.   Allergies:  Influenza vaccines, Penicillins, Other, and Cortisone   Social History: The patient  reports that she quit smoking about 11 years ago. Her smoking use included cigarettes. She smoked 0.50 packs per day. She has never used smokeless tobacco. She reports current alcohol use. She reports that she does not use drugs.   Family History: The patient's family history includes Cancer in her father and paternal grandfather; Clotting disorder in her maternal aunt, maternal aunt, and sister; Heart Problems in her mother; Rheum arthritis in her mother, sister, and sister; Thyroid disease in her sister.   ROS:  Please see the history of present illness. Otherwise, complete review of systems is positive for none.  All other systems are reviewed and negative.   Physical Exam: VS:  BP 118/80   Pulse 87   Ht 5\' 5"  (1.651 m)   Wt 245 lb 9.6 oz (111.4 kg)   SpO2 92%   BMI 40.87 kg/m , BMI Body mass index is 40.87 kg/m.  Wt Readings from Last 3 Encounters:  12/19/20 245 lb 9.6 oz (111.4 kg)  10/16/20 245 lb (111.1 kg)  08/21/20 238 lb (108 kg)    General: Patient appears comfortable at rest. Neck: Supple, no elevated JVP or carotid bruits, no thyromegaly. Lungs: Clear to auscultation, nonlabored breathing at rest. Cardiac: Regular rate and rhythm, no S3 or significant systolic murmur, no pericardial rub. Extremities: No pitting edema, distal pulses 2+. Skin: Warm and dry. Musculoskeletal: No kyphosis. Neuropsychiatric: Alert and oriented x3, affect grossly appropriate.  ECG:  An ECG dated 01/16/2021 was personally reviewed today and demonstrated:  Normal sinus rhythm rightward axis, septal infarct, age undetermined, rate of 83  Recent Labwork: 06/11/2020: TSH 2.150 07/05/2020: Magnesium  2.1 08/21/2020: BUN 25; Creatinine, Ser 0.90; Hemoglobin 13.2; Platelets 263; Potassium 4.4; Sodium 145     Component Value Date/Time   CHOL 198 07/05/2020 1122   CHOL 191 11/16/2018 0908   TRIG 137 07/05/2020 1122   HDL 37 (L) 07/05/2020 1122   HDL 43 11/16/2018 0908   CHOLHDL 5.4 07/05/2020 1122   VLDL 27 07/05/2020 1122   LDLCALC 134 (H) 07/05/2020 1122   LDLCALC 126 (H) 11/16/2018 0908    Other Studies Reviewed Today:  Diagnostic Studies 07/2018 echo Study Conclusions  - Left ventricle: The cavity size was normal. Wall thickness was increased in a pattern of mild LVH. Systolic function was normal. The estimated ejection fraction was in the range of 60% to 65%. Wall motion was normal; there were no regional wall motion abnormalities. Left ventricular diastolic function parameters were normal. - Mitral valve: There was trivial regurgitation. - Right atrium: Central venous pressure (est): 3 mm Hg. - Atrial septum: No defect or  patent foramen ovale was identified. - Tricuspid valve: There was trivial regurgitation. - Pulmonary arteries: Systolic pressure could not be accurately estimated. - Pericardium, extracardiac: A prominent pericardial fat pad was present, rule out trivial pericardial effusion.  Impressions:  - A prominent pericardial fat pad was present, rule out trivial pericardial effusion. If concern remains about epicardial mass, a chest CT would likely clarify this further.     Assessment and Plan:  1. SOB (shortness of breath)   2. Leg edema   3. Palpitations   4. Chest pain of uncertain etiology    1. SOB (shortness of breath) Patient complaining of increased dyspnea on exertion with activity.  She states sometimes when just walking to and from her car from the store causes her to have shortness of breath.  States he has been occurring over the last 5 to 7 months and appears to be progressing.  Please get an echocardiogram to  evaluate LV function, diastolic function, valvular function.  2. Leg edema Patient has leg edema which appears to be chronic.  She takes Lasix 60 mg daily which appears to help with lower extremity edema.  3. Palpitations States she has been having some instances of fluttering sometimes lasting up to a minute initially occurring a few times a week.  She states this week she has not had any incidence of fluttering but is concerned.  Please get a ZIO monitor 14-day for evaluation of arrhythmias.  Please get a thyroid panel.  4. Chest pain of uncertain etiology Complaining of left sided chest pain which radiates to left arm occurring without activity and occasionally with activity.  She denies any radiation to neck, back, jaw.  Denies any associated nausea, vomiting, or diaphoresis.  She states she recently had a sister who passed away from heart disease and is concerned.  Please get a Lexiscan Cardiolite stress test.     Medication Adjustments/Labs and Tests Ordered: Current medicines are reviewed at length with the patient today.  Concerns regarding medicines are outlined above.   Disposition: Follow-up with Dr. Harl Bowie or APP 6 to 8 weeks  Signed, Levell July, NP 12/19/2020 10:02 AM    [facility] [place], [place], [place] [address] Phone: [phone]; Fax: [phone]

## 2020-12-19 ENCOUNTER — Telehealth: Payer: Self-pay | Admitting: Cardiology

## 2020-12-19 ENCOUNTER — Ambulatory Visit (INDEPENDENT_AMBULATORY_CARE_PROVIDER_SITE_OTHER)

## 2020-12-19 ENCOUNTER — Ambulatory Visit (INDEPENDENT_AMBULATORY_CARE_PROVIDER_SITE_OTHER): Admitting: Family Medicine

## 2020-12-19 ENCOUNTER — Other Ambulatory Visit: Payer: Self-pay | Admitting: Cardiology

## 2020-12-19 ENCOUNTER — Encounter: Payer: Self-pay | Admitting: Family Medicine

## 2020-12-19 ENCOUNTER — Encounter: Payer: Self-pay | Admitting: *Deleted

## 2020-12-19 VITALS — BP 118/80 | HR 87 | Ht 65.0 in | Wt 245.6 lb

## 2020-12-19 DIAGNOSIS — R002 Palpitations: Secondary | ICD-10-CM | POA: Diagnosis not present

## 2020-12-19 DIAGNOSIS — R600 Localized edema: Secondary | ICD-10-CM | POA: Diagnosis not present

## 2020-12-19 DIAGNOSIS — R079 Chest pain, unspecified: Secondary | ICD-10-CM | POA: Diagnosis not present

## 2020-12-19 DIAGNOSIS — R0602 Shortness of breath: Secondary | ICD-10-CM

## 2020-12-19 NOTE — Patient Instructions (Addendum)
Medication Instructions:  Continue all current medications.  Labwork:  TSH, T3, T4 - orders given today.   Office will contact with results via phone or letter.    Testing/Procedures:  Your physician has requested that you have an echocardiogram. Echocardiography is a painless test that uses sound waves to create images of your heart. It provides your doctor with information about the size and shape of your heart and how well your heart's chambers and valves are working. This procedure takes approximately one hour. There are no restrictions for this procedure.  Your physician has requested that you have a lexiscan myoview. For further information please visit [email or web address]. Please follow instruction sheet, as given.  Your physician has recommended that you wear a 2 week event monitor. Event monitors are medical devices that record the heart's electrical activity. Doctors most often [place] these monitors to diagnose arrhythmias. Arrhythmias are problems with the speed or rhythm of the heartbeat. The monitor is a small, portable device. You can wear one while you do your normal daily activities. This is usually used to diagnose what is causing palpitations/syncope (passing out).  Office will contact with results via phone or letter.    Follow-Up: 6-8 weeks   Any Other Special Instructions Will Be Listed Below (If Applicable).  If you need a refill on your cardiac medications before your next appointment, please call your pharmacy.

## 2020-12-19 NOTE — Telephone Encounter (Signed)
Pre-cert Verification for the following procedure    zio 2 week event - palps  ECHO  LEXISCAN   DATE:12/26/2020 LEXISCAN             12/31/2020   ECHO   LOCATION:  [facility]

## 2020-12-19 NOTE — Addendum Note (Signed)
Addended by: Laurine Blazer on: 12/19/2020 12:46 PM   Modules accepted: Orders

## 2020-12-20 ENCOUNTER — Ambulatory Visit (INDEPENDENT_AMBULATORY_CARE_PROVIDER_SITE_OTHER): Admitting: Family Medicine

## 2020-12-20 ENCOUNTER — Ambulatory Visit (INDEPENDENT_AMBULATORY_CARE_PROVIDER_SITE_OTHER)

## 2020-12-20 ENCOUNTER — Other Ambulatory Visit: Payer: Self-pay

## 2020-12-20 ENCOUNTER — Encounter: Payer: Self-pay | Admitting: Family Medicine

## 2020-12-20 VITALS — BP 117/75 | HR 90 | Temp 97.1°F | Ht 65.0 in | Wt 247.0 lb

## 2020-12-20 DIAGNOSIS — Z780 Asymptomatic menopausal state: Secondary | ICD-10-CM

## 2020-12-20 DIAGNOSIS — R0600 Dyspnea, unspecified: Secondary | ICD-10-CM

## 2020-12-20 DIAGNOSIS — M7989 Other specified soft tissue disorders: Secondary | ICD-10-CM

## 2020-12-20 DIAGNOSIS — R0609 Other forms of dyspnea: Secondary | ICD-10-CM

## 2020-12-20 DIAGNOSIS — Z79899 Other long term (current) drug therapy: Secondary | ICD-10-CM

## 2020-12-20 DIAGNOSIS — L680 Hirsutism: Secondary | ICD-10-CM

## 2020-12-20 DIAGNOSIS — Z5181 Encounter for therapeutic drug level monitoring: Secondary | ICD-10-CM

## 2020-12-20 DIAGNOSIS — M7750 Other enthesopathy of unspecified foot: Secondary | ICD-10-CM

## 2020-12-20 DIAGNOSIS — Z23 Encounter for immunization: Secondary | ICD-10-CM

## 2020-12-20 DIAGNOSIS — R7989 Other specified abnormal findings of blood chemistry: Secondary | ICD-10-CM

## 2020-12-20 MED ORDER — SPIRONOLACTONE 50 MG PO TABS: 50.0000 mg | ORAL_TABLET | Freq: Two times a day (BID) | ORAL | 0 refills | Status: DC

## 2020-12-20 NOTE — Addendum Note (Signed)
Addended byCarrolyn Leigh on: 12/20/2020 04:40 PM   Modules accepted: Orders

## 2020-12-20 NOTE — Patient Instructions (Signed)
HOLD lasix for now. Spironolactone for hair growth sent. This is a diuretic as well so should help with leg swelling.  I'd like you to come for labs in 1 week.  See me in 1 month for recheck of hair.  Referral to sports medicine for your foot. Ice, motrin, wear supportive footwear.

## 2020-12-20 NOTE — Progress Notes (Signed)
Subjective: CC: Leg swelling, elevated testosterone level PCP: [name], DO Miranda Perkins is a 60 y.o. female presenting to clinic today for:  1.  Leg swelling/dyspnea on exertion Patient reports that her swelling has gotten better with use of Lasix.  She is using 40 mg daily.  She has been having some dyspnea on exertion, orthopnea.  She was seen by her cardiologist yesterday and there are plans for Zio patch, echocardiogram and stress testing.  EKG was obtained but no chest x-ray yet.  2.  Elevated testosterone level, hirsutism Patient was complaining of hirsutism to her OB/GYN, who obtained a testosterone level.  This was noted to be elevated so pelvic ultrasound was proceeded with.  There were some irregularities found and therefore CAT scan was ordered to evaluate adrenal glands further which were normal.  She wonders what is the next step to treat her hirsutism.  There is apparently a family history of PCOS.  3.  Foot pain Patient reports left-sided dorsal foot pain that radiates from the base of her first digit to the top of her ankle.  She reports that this is an intermittent pain but can be severe such that she is unable to walk sometimes.  She tried adjusting her footwear but this did not improve symptoms.  She uses Motrin but again limited help.  No reports of sensory changes or preceding injury.   ROS: Per HPI  Allergies  Allergen Reactions  . Influenza Vaccines Anaphylaxis and Other (See Comments)    Per patient  . Penicillins Anaphylaxis and Other (See Comments)    PATIENT HAS HAD A PCN REACTION WITH IMMEDIATE RASH, FACIAL/TONGUE/THROAT SWELLING, SOB, OR LIGHTHEADEDNESS WITH HYPOTENSION:  #  #  #  YES  #  #  #   Has patient had a PCN reaction causing severe rash involving mucus membranes or skin necrosis: No PATIENT HAS HAD A PCN REACTION THAT REQUIRED HOSPITALIZATION:  #  #  #  YES  #  #  #  Has patient had a PCN reaction occurring within the last 10  years: No   . Other     bandaids- skin turns red and a rach   . Cortisone Other (See Comments)    Turned red and ran a low grade fever for 3 days   Past Medical History:  Diagnosis Date  . Arthritis   . Cancer ([place]) 2016   basal cell, forehead  . 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 Depression    pt denies   . Multiple thyroid nodules   . Osteopenia 12/2017   T score -1.2 FRAX 4.9% / 0.3%  . Pneumonia    hx of x 2 as a child   . Stroke ([place]) 1993   while on Chloramphenicol, no residuaL; [MEDICATION WAS GIVEN TO TREAT TICK BITE] ; patient states  "i stroked out right next to my doctor , he said i passed out and that was my only symptom" ;     Current Outpatient Medications:  .  doxycycline (VIBRAMYCIN) 50 MG capsule, Take 50 mg by mouth 2 (two) times daily. Intermittent for rosacea, Disp: , Rfl:  .  furosemide (LASIX) 40 MG tablet, Take 1.5 tablets (60 mg total) by mouth daily., Disp: 135 tablet, Rfl: 3 .  ibuprofen (ADVIL) 800 MG tablet, Take 1 tablet (800 mg total) by mouth every 8 (eight) hours as needed., Disp: 30 tablet, Rfl: 0 Social History   Socioeconomic History  .  Marital status: Married    Spouse name: Not on file  . Number of children: 6  . Years of education: Not on file  . Highest education level: Not on file  Occupational History  . 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 Smoking status: Former Smoker    Packs/day: 0.50    Types: Cigarettes    Quit date: 11/26/2009    Years since quitting: 11.0  . Smokeless tobacco: Never Used  Vaping Use  . Vaping Use: Never used  Substance and Sexual Activity  . Alcohol use: Yes    Comment: rare- no drinks in 2-3 years   . Drug use: No  . Sexual activity: Not Currently    Partners: Male    Comment: 1st intercourse 61 yo-5 partners, married- 73 yrs  Other Topics Concern  . Not on file  Social History Narrative   Lives with husband. Raising 2 grandchildren of deceased  daughter.       Lives in one story ranch house.    Social Determinants of Health   Financial Resource Strain: Not on file  Food Insecurity: Not on file  Transportation Needs: Not on file  Physical Activity: Not on file  Stress: Not on file  Social Connections: Not on file  Intimate Partner Violence: Not on file   Family History  Problem Relation Age of Onset  . Rheum arthritis Sister   . Clotting disorder Sister   . Rheum arthritis Sister   . Thyroid disease Sister   . Clotting disorder Maternal Aunt   . Clotting disorder Maternal Aunt   . Heart Problems Mother   . Rheum arthritis Mother   . Cancer Father        ? type  . Cancer Paternal Grandfather        Brain cancer    Objective: Office vital signs reviewed. BP 117/75   Pulse 90   Temp (!) 97.1 F (36.2 C) (Temporal)   Ht 5\' 5"  (1.651 m)   Wt 247 lb (112 kg)   SpO2 97%   BMI 41.10 kg/m   Physical Examination:  General: Awake, alert, well nourished, No acute distress HEENT: scant hair on chin, MMM Cardio: regular rate and rhythm, S1S2 heard, no murmurs appreciated Pulm: clear to auscultation bilaterally, no wheezes, rhonchi or rales; normal work of breathing on room air Extremities: warm, well perfused, No edema, cyanosis or clubbing; +2 pulses bilaterally MSK: normal gait and station  Left foot: She has full active range of motion.  With resisted dorsi flexion and plantar flexion of the left great toe she had reproduction of her pain.  There was no tenderness along the first metatarsal.  No palpable deformities.  No appreciable effusion or soft tissue swelling. Neuro: Light touch and station was grossly intact  Assessment/ Plan: 60 y.o. female   Leg swelling - Plan: DG Chest 2 View, spironolactone (ALDACTONE) 50 MG tablet, TSH, CBC, CANCELED: CBC, CANCELED: TSH  Dyspnea on exertion - Plan: DG Chest 2 View, TSH, CBC, CANCELED: CBC, CANCELED: TSH  Encounter for monitoring diuretic therapy - Plan: Basic  Metabolic Panel, CANCELED: Basic Metabolic Panel  Foot tendinitis - Plan: Ambulatory referral to Sports Medicine  Asymptomatic postmenopausal estrogen deficiency - Plan: DG [facility] DEXA, DG [facility] DEXA  Female hirsutism - Plan: spironolactone (ALDACTONE) 50 MG tablet  Elevated testosterone level in female - Plan: spironolactone (ALDACTONE) 50 MG tablet  Her swelling has gotten better and  there is no significant edema noted on today's exam.  I would like her to hold her Lasix for now as I am going to place her on spironolactone for hirsutism.  I reviewed her lab results from OB/GYN as well as ultrasound and CT reads.  The spironolactone may be of benefit given potential systolic heart failure.  She is being worked up for this.  Chest x-ray did not show significantly enlarged heart or significant pulmonary edema.  Awaiting formal review by radiology  I suspect that what is going on in the left great toe is in fact a tendinitis.  Given ongoing symptoms despite conservative treatments we will place a referral to sports medicine center.  She has been previously evaluated by a colleague of mine here and x-ray was unremarkable.  We discussed icing the affected area, continued use of NSAIDs if needed.  Recommended supportive footwear.  Will await their formal assessment   No orders of the defined types were placed in this encounter.  No orders of the defined types were placed in this encounter.    [name], DO [place] [phone]

## 2020-12-21 DIAGNOSIS — Z8616 Personal history of COVID-19: Secondary | ICD-10-CM

## 2020-12-21 HISTORY — DX: Personal history of COVID-19: Z86.16

## 2020-12-23 ENCOUNTER — Telehealth: Payer: Self-pay

## 2020-12-23 ENCOUNTER — Encounter: Payer: Self-pay | Admitting: Family Medicine

## 2020-12-23 ENCOUNTER — Ambulatory Visit (INDEPENDENT_AMBULATORY_CARE_PROVIDER_SITE_OTHER): Admitting: Family Medicine

## 2020-12-23 DIAGNOSIS — U071 COVID-19: Secondary | ICD-10-CM

## 2020-12-23 MED ORDER — BENZONATATE 100 MG PO CAPS: 100.0000 mg | ORAL_CAPSULE | Freq: Three times a day (TID) | ORAL | 0 refills | Status: DC | PRN

## 2020-12-23 MED ORDER — PREDNISONE 10 MG (21) PO TBPK: ORAL_TABLET | ORAL | 0 refills | Status: DC

## 2020-12-23 NOTE — Telephone Encounter (Signed)
Oh no.  Can she check her BP at home?  When did she test positive?  She may be a candidate for COVID treatment.  Please get her on the schedule for virtual visit if you can.

## 2020-12-23 NOTE — Telephone Encounter (Signed)
Patient added as a televisit for today. Left detailed message on patients voicemail about appt

## 2020-12-23 NOTE — Progress Notes (Signed)
    Virtual Visit via telephone Note Due to COVID-19 pandemic this visit was conducted virtually. This visit type was conducted due to national recommendations for restrictions regarding the COVID-19 Pandemic (e.g. social distancing, sheltering in place) in an effort to limit this patient's exposure and mitigate transmission in our community. All issues noted in this document were discussed and addressed.  A physical exam was not performed with this format.  I connected with Miranda Perkins on 12/23/20 at 1524 by telephone and verified that I am speaking with the correct person using two identifiers. Miranda Perkins is currently located at home and no one is currently with her during the visit. The provider, Gwenlyn Perking, FNP is located in their office at time of visit.  I discussed the limitations, risks, security and privacy concerns of performing an evaluation and management service by telephone and the availability of in person appointments. I also discussed with the patient that there may be a patient responsible charge related to this service. The patient expressed understanding and agreed to proceed.  CC: Covid positive  History and Present Illness:  HPI  Pat tested positive for Covid 19 with a home test this morning. She reports that her symptoms started last night with body aches, congestion, headache, and a productive cough with yellow sputum. She reports a temperature of 99.4. She has been using mucinex, benadryl, tylenol, and flonase with some relief. She denies shortness of breath or chest pain. She is quarantining in her room.    ROS As per HPI.   Observations/Objective: Alert and oriented x 3. Able to speak in full sentences without difficulty.   Assessment and Plan: Miranda Perkins was seen today for covid positive.  Diagnoses and all orders for this visit:  COVID-19 virus infection Referral for Covid treatment placed to see if patient would be able to receive any  treatment. Continue mucinex and tylenol. Discussed nasal saline for congestion. Tessalon perles and steroid dose pack ordered. Stay well hydrated, rest. Return to office for new or worsening symptoms. Patient is aware of when to seek emergency care.  -     benzonatate (TESSALON PERLES) 100 MG capsule; Take 1 capsule (100 mg total) by mouth 3 (three) times daily as needed for cough. -     predniSONE (STERAPRED UNI-PAK 21 TAB) 10 MG (21) TBPK tablet; Use as directed on back of pill pack -     Ambulatory referral for Covid Treatment    Follow Up Instructions: As needed.    I discussed the assessment and treatment plan with the patient. The patient was provided an opportunity to ask questions and all were answered. The patient agreed with the plan and demonstrated an understanding of the instructions.   The patient was advised to call back or seek an in-person evaluation if the symptoms worsen or if the condition fails to improve as anticipated.  The above assessment and management plan was discussed with the patient. The patient verbalized understanding of and has agreed to the management plan. Patient is aware to call the clinic if symptoms persist or worsen. Patient is aware when to return to the clinic for a follow-up visit. Patient educated on when it is appropriate to go to the emergency department.   Time call ended:  1532  I provided 8 minutes of phone time during this encounter.    Gwenlyn Perking, FNP

## 2020-12-23 NOTE — Telephone Encounter (Signed)
Pt called stating that she saw Dr Lajuana Ripple on 2/18 and was put on some medicine. Pt was told to make a 1 wk follow up but pt wanted to let Dr Lajuana Ripple know that she cant come in because she tested positive for COVID this morning.

## 2020-12-24 ENCOUNTER — Telehealth: Payer: Self-pay | Admitting: Oncology

## 2020-12-24 ENCOUNTER — Other Ambulatory Visit: Payer: Self-pay | Admitting: Adult Health

## 2020-12-24 ENCOUNTER — Encounter: Payer: Self-pay | Admitting: Oncology

## 2020-12-24 DIAGNOSIS — R002 Palpitations: Secondary | ICD-10-CM

## 2020-12-24 MED ORDER — MOLNUPIRAVIR EUA 200MG CAPSULE: 4.0000 | ORAL_CAPSULE | Freq: Two times a day (BID) | ORAL | 0 refills | Status: AC

## 2020-12-24 MED FILL — MOLNUPIRAVIR 200 MG CAPS: 200 | 5 days supply | Qty: 40 | Fill #0

## 2020-12-24 NOTE — Telephone Encounter (Signed)
Called to discuss with patient about COVID-19 symptoms and the use of one of the available treatments for those with mild to moderate Covid symptoms and at a high risk of hospitalization.  Pt appears to qualify for outpatient treatment due to co-morbid conditions and/or a member of an at-risk group in accordance with the FDA Emergency Use Authorization.    Symptom onset: 12/23/20 Vaccinated: Yes Booster? Yes Immunocompromised? No Qualifiers:  Past Medical History:  Diagnosis Date  . Arthritis   . 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 Depression    pt denies   . Multiple thyroid nodules   . Osteopenia 12/2017   T score -1.2 FRAX 4.9% / 0.3%  . Pneumonia    hx of x 2 as a child   . Stroke ([place]) 1993   while on Chloramphenicol, no residuaL; [MEDICATION WAS GIVEN TO TREAT TICK BITE] ; patient states  "i stroked out right next to my doctor , he said i passed out and that was my only symptom" ;      Unable to reach pt - Left VM and [facility]   Jacquelin Hawking

## 2020-12-24 NOTE — Progress Notes (Signed)
Outpatient Oral COVID Treatment Note  I connected with Miranda Perkins on 12/24/2020/1:52 PM by telephone and verified that I am speaking with the correct person using two identifiers.  I discussed the limitations, risks, security, and privacy concerns of performing an evaluation and management service by telephone and the availability of in person appointments. I also discussed with the patient that there may be a patient responsible charge related to this service. The patient expressed understanding and agreed to proceed.  Patient location: Home  Provider location: Office   Diagnosis: COVID-19 infection  Purpose of visit: Discussion of potential use of Molnupiravir or Paxlovid, a new treatment for mild to moderate COVID-19 viral infection in non-hospitalized patients.   Subjective: Patient is a 60 y.o. female who has been diagnosed with COVID 19 viral infection.  Their symptoms began on 12/22/20  with cold like symptoms   Past Medical History:  Diagnosis Date  . Arthritis   . 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 Depression    pt denies   . Multiple thyroid nodules   . Osteopenia 12/2017   T score -1.2 FRAX 4.9% / 0.3%  . Pneumonia    hx of x 2 as a child   . Stroke ([place]) 1993   while on Chloramphenicol, no residuaL; [MEDICATION WAS GIVEN TO TREAT TICK BITE] ; patient states  "i stroked out right next to my doctor , he said i passed out and that was my only symptom" ;     Allergies  Allergen Reactions  . Influenza Vaccines Anaphylaxis and Other (See Comments)    Per patient  . Penicillins Anaphylaxis and Other (See Comments)    PATIENT HAS HAD A PCN REACTION WITH IMMEDIATE RASH, FACIAL/TONGUE/THROAT SWELLING, SOB, OR LIGHTHEADEDNESS WITH HYPOTENSION:  #  #  #  YES  #  #  #   Has patient had a PCN reaction causing severe rash involving mucus membranes or skin necrosis: No PATIENT HAS HAD A PCN REACTION THAT REQUIRED  HOSPITALIZATION:  #  #  #  YES  #  #  #  Has patient had a PCN reaction occurring within the last 10 years: No   . Other     bandaids- skin turns red and a rach   . Cortisone Other (See Comments)    Turned red and ran a low grade fever for 3 days     Current Outpatient Medications:  .  benzonatate (TESSALON PERLES) 100 MG capsule, Take 1 capsule (100 mg total) by mouth 3 (three) times daily as needed for cough., Disp: 20 capsule, Rfl: 0 .  doxycycline (VIBRAMYCIN) 50 MG capsule, Take 50 mg by mouth 2 (two) times daily. Intermittent for rosacea, Disp: , Rfl:  .  furosemide (LASIX) 40 MG tablet, Take 1.5 tablets (60 mg total) by mouth daily., Disp: 135 tablet, Rfl: 3 .  ibuprofen (ADVIL) 800 MG tablet, Take 1 tablet (800 mg total) by mouth every 8 (eight) hours as needed., Disp: 30 tablet, Rfl: 0 .  predniSONE (STERAPRED UNI-PAK 21 TAB) 10 MG (21) TBPK tablet, Use as directed on back of pill pack, Disp: 21 tablet, Rfl: 0 .  spironolactone (ALDACTONE) 50 MG tablet, Take 1 tablet (50 mg total) by mouth 2 (two) times daily., Disp: 60 tablet, Rfl: 0  Objective: Patient appears/sounds okay .speaks in full sentences .  They are in no apparent distress.  Breathing is non  labored.  Mood and behavior are normal.  Laboratory Data:  Recent Results (from the past 2160 hour(s))  Testos,Total,Free and SHBG (Female)     Status: Abnormal   Collection Time: 10/16/20 12:57 PM  Result Value Ref Range   Testosterone, Total, LC-MS-MS 63 (H) 2 - 45 ng/dL    Comment: . For additional information, please refer to http://education.questdiagnostics.com/faq/ TotalTestosteroneLCMSMSFAQ165 (This link is being provided for informational/ educational purposes only.) . This test was developed and its analytical performance characteristics have been determined by [place], [place]. It has not been cleared or approved by the U.S. Food and Drug Administration. This assay has been  validated pursuant to the CLIA regulations and is used for clinical purposes. .    Free Testosterone 8.0 (H) 0.1 - 6.4 pg/mL    Comment: . This test was developed and its analytical performance characteristics have been determined by [place], [place]. It has not been cleared or approved by the U.S. Food and Drug Administration. This assay has been validated pursuant to the CLIA regulations and is used for clinical purposes. .    Sex Hormone Binding 25 14 - 73 nmol/L     Assessment: 60 y.o. female with mild/moderate COVID 19 viral infection diagnosed on 12/22/20 at high risk for progression to severe COVID 19.  Plan:  This patient is a 60 y.o. female that meets the following criteria for Emergency Use Authorization of: Molnupiravir  1. Age >18 yr 2. SARS-COV-2 positive test 3. Symptom onset < 5 days 4. Mild-to-moderate COVID disease with high risk for severe progression to hospitalization or death   I have spoken and communicated the following to the patient or parent/caregiver regarding: 1. Molnupiravir is an unapproved drug that is authorized for use under an [employer].  2. There are no adequate, approved, available products for the treatment of COVID-19 in adults who have mild-to-moderate COVID-19 and are at high risk for progressing to severe COVID-19, including hospitalization or death. 3. Other therapeutics are currently authorized. For additional information on all products authorized for treatment or prevention of COVID-19, please see [employer].  4. There are benefits and risks of taking this treatment as outlined in the "Fact Sheet for Patients and Caregivers."  5. "Fact Sheet for Patients and Caregivers" was reviewed with patient. A hard copy will be provided to patient from pharmacy prior to the patient  receiving treatment. 6. Patients should continue to self-isolate and use infection control measures (e.g., wear mask, isolate, social distance, avoid sharing personal items, clean and disinfect "high touch" surfaces, and frequent handwashing) according to CDC guidelines.  7. The patient or parent/caregiver has the option to accept or refuse treatment. 8. [place] has established a pregnancy surveillance program. 9. Females of childbearing potential should use a reliable method of contraception correctly and consistently, as applicable, for the duration of treatment and for 4 days after the last dose of Molnupiravir. 47. Males of reproductive potential who are sexually active with females of childbearing potential should use a reliable method of contraception correctly and consistently during treatment and for at least 3 months after the last dose. 11. Pregnancy status and risk was assessed. Patient verbalized understanding of precautions.   After reviewing above information with the patient, the patient agrees to receive molnupiravir.  Declines coming out for labs, prefers Molnupiravir.   Follow up instructions:    . Take prescription BID x 5 days as directed . Reach out to  pharmacist for counseling on medication if desired . For concerns regarding further COVID symptoms please follow up with your PCP or urgent care . For urgent or life-threatening issues, seek care at your local emergency department  The patient was provided an opportunity to ask questions, and all were answered. The patient agreed with the plan and demonstrated an understanding of the instructions.   Script sent to [facility] and opted to pick up RX.  The patient was advised to call their PCP or seek an in-person evaluation if the symptoms worsen or if the condition fails to improve as anticipated.   I provided 22  minutes of non face-to-face telephone visit time during this encounter, and >  50% was spent counseling as documented under my assessment & plan.  Rexene Edison, NP 12/24/2020 /1:52 PM

## 2020-12-24 NOTE — Progress Notes (Signed)
See previous order note , rx sent

## 2020-12-25 ENCOUNTER — Other Ambulatory Visit

## 2020-12-25 ENCOUNTER — Encounter

## 2020-12-26 ENCOUNTER — Encounter (HOSPITAL_COMMUNITY)

## 2020-12-26 ENCOUNTER — Other Ambulatory Visit (HOSPITAL_COMMUNITY)

## 2020-12-31 ENCOUNTER — Telehealth: Payer: Self-pay | Admitting: Cardiology

## 2020-12-31 ENCOUNTER — Other Ambulatory Visit (HOSPITAL_COMMUNITY)

## 2020-12-31 NOTE — Telephone Encounter (Signed)
Pre-cert Verification for the following procedure     ECHO   DATE:  01/24/2021  LOCATION:  [facility]

## 2021-01-02 ENCOUNTER — Other Ambulatory Visit

## 2021-01-02 ENCOUNTER — Other Ambulatory Visit: Payer: Self-pay

## 2021-01-02 DIAGNOSIS — M7989 Other specified soft tissue disorders: Secondary | ICD-10-CM

## 2021-01-02 DIAGNOSIS — R0609 Other forms of dyspnea: Secondary | ICD-10-CM

## 2021-01-02 DIAGNOSIS — Z5181 Encounter for therapeutic drug level monitoring: Secondary | ICD-10-CM

## 2021-01-02 DIAGNOSIS — R0600 Dyspnea, unspecified: Secondary | ICD-10-CM

## 2021-01-03 LAB — CBC
Hematocrit: 47.8 % — ABNORMAL HIGH (ref 34.0–46.6)
Hemoglobin: 15.8 g/dL (ref 11.1–15.9)
MCH: 28.7 pg (ref 26.6–33.0)
MCHC: 33.1 g/dL (ref 31.5–35.7)
MCV: 87 fL (ref 79–97)
Platelets: 364 10*3/uL (ref 150–450)
RBC: 5.51 x10E6/uL — ABNORMAL HIGH (ref 3.77–5.28)
RDW: 13.1 % (ref 11.7–15.4)
WBC: 9.3 10*3/uL (ref 3.4–10.8)

## 2021-01-03 LAB — BASIC METABOLIC PANEL
BUN/Creatinine Ratio: 12 (ref 12–28)
BUN: 10 mg/dL (ref 8–27)
CO2: 19 mmol/L — ABNORMAL LOW (ref 20–29)
Calcium: 10 mg/dL (ref 8.7–10.3)
Chloride: 101 mmol/L (ref 96–106)
Creatinine, Ser: 0.84 mg/dL (ref 0.57–1.00)
Glucose: 121 mg/dL — ABNORMAL HIGH (ref 65–99)
Potassium: 4.9 mmol/L (ref 3.5–5.2)
Sodium: 138 mmol/L (ref 134–144)
eGFR: 79 mL/min/{1.73_m2} (ref >59)

## 2021-01-03 LAB — TSH: TSH: 2.16 u[IU]/mL (ref 0.450–4.500)

## 2021-01-06 ENCOUNTER — Ambulatory Visit (INDEPENDENT_AMBULATORY_CARE_PROVIDER_SITE_OTHER): Admitting: Sports Medicine

## 2021-01-06 ENCOUNTER — Other Ambulatory Visit: Payer: Self-pay

## 2021-01-06 ENCOUNTER — Encounter: Payer: Self-pay | Admitting: Sports Medicine

## 2021-01-06 VITALS — BP 126/70 | Ht 65.0 in | Wt 240.0 lb

## 2021-01-06 DIAGNOSIS — M76812 Anterior tibial syndrome, left leg: Secondary | ICD-10-CM

## 2021-01-06 MED ORDER — MELOXICAM 15 MG PO TABS: ORAL_TABLET | ORAL | 0 refills | Status: DC

## 2021-01-06 NOTE — Progress Notes (Signed)
Follow-up in 2 weeks

## 2021-01-06 NOTE — Patient Instructions (Signed)
Follow-up in 2 weeks

## 2021-01-06 NOTE — Progress Notes (Signed)
   Subjective:    Patient ID: Miranda Perkins, female    DOB: November 03, 1960, 60 y.o.   MRN: [id number]  HPI chief complaint: Left foot and ankle pain  Very pleasant 60 year old female comes in today complaining of several weeks of anterior left ankle and foot pain.  She denies any trauma but rather describes intermittent pain and swelling which can be quite severe at times.  Majority of her pain is along the anterior ankle but she does get radiating pain into the dorsum of her foot as well as up into her lower leg.  She has a remote history of surgery on this foot in the 1990s but nothing recently.  She is referred to [place] by her PCP for evaluation and treatment.  She has had x-rays of her foot done which showed some degenerative changes around the midfoot as well as an old healed fracture at the base of the fifth metatarsal.  She was recently treated for Covid, including a prescription for prednisone, which improved her foot pain dramatically at that time.  Past medical history is reviewed Medications reviewed Allergies reviewed Surgical history reviewed.  It is significant for a left total knee arthroplasty done approximately 2 years ago.  She never regained full flexion in this knee.    Review of Systems    As above Objective:   Physical Exam  Well-developed, well-nourished.  No acute distress  Left ankle shows some limited range of motion.  There is some mild soft tissue swelling diffusely around the ankle and into the dorsum of the foot.  There is tenderness to palpation along the course of the anterior tibialis tendon with reproducible pain with resisted dorsiflexion of the left foot.  No joint effusion.  No erythema.  No tenderness to palpation directly over the navicular.  Good pulses.  Walking with the assistance of a cane.  Examination of her feet in the standing position shows fairly well-preserved arches  X-rays of the left foot are as above      Assessment & Plan:   Left  ankle/foot pain likely secondary to anterior tibialis tendinitis  We are going to immobilize with a short walking boot.  She will start meloxicam 15 mg daily for 7 days then as needed.  She will discontinue all other over-the-counter NSAIDs.  Follow-up with me in 2 weeks.  If symptoms persist, consider further diagnostic imaging.  Call with questions or concerns in the interim.

## 2021-01-08 ENCOUNTER — Telehealth: Payer: Self-pay | Admitting: Family Medicine

## 2021-01-08 ENCOUNTER — Encounter: Payer: Self-pay | Admitting: Family Medicine

## 2021-01-08 ENCOUNTER — Ambulatory Visit (INDEPENDENT_AMBULATORY_CARE_PROVIDER_SITE_OTHER): Admitting: Family Medicine

## 2021-01-08 DIAGNOSIS — U071 COVID-19: Secondary | ICD-10-CM | POA: Diagnosis not present

## 2021-01-08 MED ORDER — DEXAMETHASONE 6 MG PO TABS: 6.0000 mg | ORAL_TABLET | Freq: Every day | ORAL | 0 refills | Status: DC

## 2021-01-08 MED ORDER — ALBUTEROL SULFATE HFA 108 (90 BASE) MCG/ACT IN AERS: 2.0000 | INHALATION_SPRAY | Freq: Four times a day (QID) | RESPIRATORY_TRACT | 2 refills | Status: DC | PRN

## 2021-01-08 MED ORDER — BENZONATATE 100 MG PO CAPS: 100.0000 mg | ORAL_CAPSULE | Freq: Three times a day (TID) | ORAL | 1 refills | Status: DC | PRN

## 2021-01-08 NOTE — Progress Notes (Signed)
Virtual Visit via Telephone Note  I connected with Miranda Perkins on 01/08/21 at 12:08 PM by telephone and verified that I am speaking with the correct person using two identifiers. Miranda Perkins is currently located at home and her husband and grandson are currently with her during this visit. The provider, Loman Brooklyn, FNP is located in their office at time of visit.  I discussed the limitations, risks, security and privacy concerns of performing an evaluation and management service by telephone and the availability of in person appointments. I also discussed with the patient that there may be a patient responsible charge related to this service. The patient expressed understanding and agreed to proceed.  Subjective: PCP: [name], DO  Chief Complaint  Patient presents with  . Cough   Patient was diagnosed with COVID-19 a little over 2 weeks ago.  She was treated with Tessalon Perles, prednisone, and Molnupiravir.  She reports her lungs cannot seem to get better.  She is having a hard time breathing especially at night.  She is using an albuterol inhaler, Mucinex, the Tessalon Perles, and Tylenol.  Denies any fever.  Her oxygen saturation while on the phone was 97%.   ROS: Per HPI  Current Outpatient Medications:  .  benzonatate (TESSALON PERLES) 100 MG capsule, Take 1 capsule (100 mg total) by mouth 3 (three) times daily as needed for cough., Disp: 20 capsule, Rfl: 0 .  doxycycline (VIBRAMYCIN) 50 MG capsule, Take 50 mg by mouth 2 (two) times daily. Intermittent for rosacea, Disp: , Rfl:  .  furosemide (LASIX) 40 MG tablet, Take 1.5 tablets (60 mg total) by mouth daily., Disp: 135 tablet, Rfl: 3 .  ibuprofen (ADVIL) 800 MG tablet, Take 1 tablet (800 mg total) by mouth every 8 (eight) hours as needed., Disp: 30 tablet, Rfl: 0 .  meloxicam (MOBIC) 15 MG tablet, Take one pill a day with food for 7 days and then prn thereafter, Disp: 30 tablet, Rfl: 0 .  predniSONE  (STERAPRED UNI-PAK 21 TAB) 10 MG (21) TBPK tablet, Use as directed on back of pill pack, Disp: 21 tablet, Rfl: 0 .  spironolactone (ALDACTONE) 50 MG tablet, Take 1 tablet (50 mg total) by mouth 2 (two) times daily., Disp: 60 tablet, Rfl: 0  Allergies  Allergen Reactions  . Influenza Vaccines Anaphylaxis and Other (See Comments)    Per patient  . Penicillins Anaphylaxis and Other (See Comments)    PATIENT HAS HAD A PCN REACTION WITH IMMEDIATE RASH, FACIAL/TONGUE/THROAT SWELLING, SOB, OR LIGHTHEADEDNESS WITH HYPOTENSION:  #  #  #  YES  #  #  #   Has patient had a PCN reaction causing severe rash involving mucus membranes or skin necrosis: No PATIENT HAS HAD A PCN REACTION THAT REQUIRED HOSPITALIZATION:  #  #  #  YES  #  #  #  Has patient had a PCN reaction occurring within the last 10 years: No   . Other     bandaids- skin turns red and a rach   . Cortisone Other (See Comments)    Turned red and ran a low grade fever for 3 days   Past Medical History:  Diagnosis Date  . Arthritis   . 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 Depression    pt denies   . Multiple thyroid nodules   . Osteopenia 12/2017   T score -  1.2 FRAX 4.9% / 0.3%  . Pneumonia    hx of x 2 as a child   . Stroke ([place]) 1993   while on Chloramphenicol, no residuaL; [MEDICATION WAS GIVEN TO TREAT TICK BITE] ; patient states  "i stroked out right next to my doctor , he said i passed out and that was my only symptom" ;     Observations/Objective: A&O  No respiratory distress or wheezing audible over the phone Mood, judgement, and thought processes all WNL  Assessment and Plan: 1. COVID-19 virus infection - albuterol (VENTOLIN HFA) 108 (90 Base) MCG/ACT inhaler; Inhale 2 puffs into the lungs every 6 (six) hours as needed.  Dispense: 18 g; Refill: 2 - benzonatate (TESSALON PERLES) 100 MG capsule; Take 1 capsule (100 mg total) by mouth 3 (three) times daily as needed for  cough.  Dispense: 30 capsule; Refill: 1 - dexamethasone (DECADRON) 6 MG tablet; Take 1 tablet (6 mg total) by mouth daily.  Dispense: 5 tablet; Refill: 0   Follow Up Instructions:  I discussed the assessment and treatment plan with the patient. The patient was provided an opportunity to ask questions and all were answered. The patient agreed with the plan and demonstrated an understanding of the instructions.   The patient was advised to call back or seek an in-person evaluation if the symptoms worsen or if the condition fails to improve as anticipated.  The above assessment and management plan was discussed with the patient. The patient verbalized understanding of and has agreed to the management plan. Patient is aware to call the clinic if symptoms persist or worsen. Patient is aware when to return to the clinic for a follow-up visit. Patient educated on when it is appropriate to go to the emergency department.   Time call ended: 12:21 PM  I provided 13 minutes of non-face-to-face time during this encounter.  Hendricks Limes, MSN, APRN, FNP-C [facility] 01/08/21

## 2021-01-12 ENCOUNTER — Other Ambulatory Visit: Payer: Self-pay | Admitting: Family Medicine

## 2021-01-12 DIAGNOSIS — M7989 Other specified soft tissue disorders: Secondary | ICD-10-CM

## 2021-01-12 DIAGNOSIS — R7989 Other specified abnormal findings of blood chemistry: Secondary | ICD-10-CM

## 2021-01-12 DIAGNOSIS — L680 Hirsutism: Secondary | ICD-10-CM

## 2021-01-15 ENCOUNTER — Other Ambulatory Visit: Payer: Self-pay

## 2021-01-15 ENCOUNTER — Ambulatory Visit (INDEPENDENT_AMBULATORY_CARE_PROVIDER_SITE_OTHER): Admitting: Family Medicine

## 2021-01-15 VITALS — BP 123/85 | HR 79 | Temp 97.2°F | Ht 65.0 in | Wt 247.0 lb

## 2021-01-15 DIAGNOSIS — L680 Hirsutism: Secondary | ICD-10-CM | POA: Diagnosis not present

## 2021-01-15 DIAGNOSIS — L739 Follicular disorder, unspecified: Secondary | ICD-10-CM

## 2021-01-15 DIAGNOSIS — Z5181 Encounter for therapeutic drug level monitoring: Secondary | ICD-10-CM | POA: Diagnosis not present

## 2021-01-15 DIAGNOSIS — R7989 Other specified abnormal findings of blood chemistry: Secondary | ICD-10-CM | POA: Diagnosis not present

## 2021-01-15 DIAGNOSIS — M7989 Other specified soft tissue disorders: Secondary | ICD-10-CM

## 2021-01-15 LAB — BASIC METABOLIC PANEL
BUN/Creatinine Ratio: 18 (ref 12–28)
BUN: 18 mg/dL (ref 8–27)
CO2: 22 mmol/L (ref 20–29)
Calcium: 9 mg/dL (ref 8.7–10.3)
Chloride: 104 mmol/L (ref 96–106)
Creatinine, Ser: 1.01 mg/dL — ABNORMAL HIGH (ref 0.57–1.00)
Glucose: 82 mg/dL (ref 65–99)
Potassium: 4.6 mmol/L (ref 3.5–5.2)
Sodium: 140 mmol/L (ref 134–144)
eGFR: 63 mL/min/{1.73_m2} (ref >59)

## 2021-01-15 MED ORDER — SPIRONOLACTONE 50 MG PO TABS: 50.0000 mg | ORAL_TABLET | Freq: Two times a day (BID) | ORAL | 0 refills | Status: DC

## 2021-01-15 NOTE — Progress Notes (Signed)
Subjective: CC: hirsutism PCP: Miranda Norlander, DO ZOX:[id number] Miranda Perkins Perkins is Miranda Perkins 60 y.o. female presenting to clinic today for:  1. Hirsutism/ elevated testosterone level She reports tolerating the medication without difficulty.  Swelling in the lower extremity has totally resolved.  She does not report any concerning symptoms such as heart palpitations, chest pain or dizziness.  She has not yet noticed any big changes with hirsutism but is optimistic.   ROS: Per HPI  Allergies  Allergen Reactions   Influenza Vaccines Anaphylaxis and Other (See Comments)    Per patient   Penicillins Anaphylaxis and Other (See Comments)    PATIENT HAS HAD Miranda Perkins Perkins REACTION WITH IMMEDIATE RASH, FACIAL/TONGUE/THROAT SWELLING, SOB, OR LIGHTHEADEDNESS WITH HYPOTENSION:  #  #  #  YES  #  #  #   Has patient had Miranda Perkins Perkins reaction causing severe rash involving mucus membranes or skin necrosis: No PATIENT HAS HAD Miranda Perkins Perkins REACTION THAT REQUIRED HOSPITALIZATION:  #  #  #  YES  #  #  #  Has patient had Miranda Perkins Perkins reaction occurring within the last 10 years: No    Other     bandaids- skin turns red and Miranda Perkins Perkins    Cortisone Other (See Comments)    Turned red and ran Miranda Perkins Perkins grade fever for 3 days   Past Medical History:  Diagnosis Date   Arthritis    Cancer ([place]) 2016   basal cell, forehead   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   Depression    pt denies    Multiple thyroid nodules    Osteopenia 12/2017   T score -1.2 FRAX 4.9% / 0.3%   Pneumonia    hx of x 2 as Miranda Perkins Perkins    Stroke ([place]) 1993   while on Chloramphenicol, no residuaL; [MEDICATION WAS GIVEN TO TREAT TICK BITE] ; patient states  "i stroked out right next to my doctor , he said i passed out and that was my only symptom" ;     Current Outpatient Medications:    albuterol (VENTOLIN HFA) 108 (90 Base) MCG/ACT inhaler, Inhale 2 puffs into the lungs every 6 (six) hours as needed., Disp: 18 g, Rfl: 2   benzonatate (TESSALON PERLES) 100 MG  capsule, Take 1 capsule (100 mg total) by mouth 3 (three) times daily as needed for cough., Disp: 30 capsule, Rfl: 1   doxycycline (VIBRAMYCIN) 50 MG capsule, Take 50 mg by mouth 2 (two) times daily. Intermittent for rosacea, Disp: , Rfl:    furosemide (LASIX) 40 MG tablet, Take 1.5 tablets (60 mg total) by mouth daily., Disp: 135 tablet, Rfl: 3   ibuprofen (ADVIL) 800 MG tablet, Take 1 tablet (800 mg total) by mouth every 8 (eight) hours as needed., Disp: 30 tablet, Rfl: 0   meloxicam (MOBIC) 15 MG tablet, Take one pill Miranda Perkins Perkins with food for 7 days and then prn thereafter, Disp: 30 tablet, Rfl: 0   spironolactone (ALDACTONE) 50 MG tablet, TAKE 1 TABLET BY MOUTH TWICE Miranda Perkins Perkins, Disp: 60 tablet, Rfl: 0 Social History   Socioeconomic History   Marital status: Married    Spouse name: Not on file   Number of children: 6   Years of education: Not on file   Highest education level: Not on file  Occupational History   Occupation: retired    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 Raising 2 grandchildren of deceased daughter.       Lives in one story ranch house.    Social Determinants of Health   Financial Resource Strain: Not on file  Food Insecurity: Not on file  Transportation Needs: Not on file  Physical Activity: Not on file  Stress: Not on file  Social Connections: Not on file  Intimate Partner Violence: Not on file   Family History  Problem Relation  Age of Onset   Rheum arthritis Sister    Clotting disorder Sister    Rheum arthritis Sister    Thyroid disease Sister    Clotting disorder Maternal Aunt    Clotting disorder Maternal Aunt    Heart Problems Mother    Rheum arthritis Mother    Cancer Father        ? type   Cancer Paternal Grandfather        Brain cancer    Objective: Office vital signs reviewed. BP 123/85    Pulse 79    Temp (!) 97.2 F (36.2 C) (Temporal)    Ht 5\' 5"  (1.651 m)    Wt 247 lb (112 kg)    SpO2 96%    BMI 41.10 kg/m   Physical Examination:  General: Awake, alert, No acute distress Cardio: regular rate and rhythm, S1S2 heard, no murmurs appreciated Pulm: clear to auscultation bilaterally, no wheezes, rhonchi or rales; normal work of breathing on room air Extremities: warm, well perfused, No edema, cyanosis or clubbing; +2 pulses bilaterally Skin: Folliculitis noted along the chin.  She has some scant hirsutism noted along the left shoulder.  Assessment/ Plan: 60 y.o. female   Female hirsutism - Plan: Basic Metabolic Panel, spironolactone (ALDACTONE) 50 MG tablet  Elevated testosterone level in female - Plan: spironolactone (ALDACTONE) 50 MG tablet  Encounter for medication monitoring - Plan: Basic Metabolic Panel  Leg swelling - Plan: spironolactone (ALDACTONE) 50 MG tablet  Folliculitis  Repeat BMP.  Previous BMP was normal.  Continue with spironolactone for at least next 3 months.  We will reevaluate in 3 months and adjust medication accordingly if needed.  Edema is better.   Folliculitis noted at the area of waxing.  We discussed proper skin technique including exfoliation, hydration.  Would avoid use of rubbing alcohol and use which hazel if needed for toner.  Skin care instructions were reviewed with  No orders of the defined types were placed in this encounter.  No orders of the defined types were placed in this encounter.    Miranda Norlander, DO [place] [phone]

## 2021-01-15 NOTE — Patient Instructions (Signed)
After wax: gentle exfoliation will reduce risk of bumps (couple of days should suffice) Witch Onalee Hua is a good astringent. Would not use alcohol  Acne Plan  Products: Face Wash:  Use a gentle cleanser, such as Cetaphil (generic version of this is fine) Moisturizer:  Use an "oil-free" moisturizer with SPF  Morning: Wash face, then completely dry Apply Moisturizer to entire face  Bedtime: Wash face, then completely dry  Remember: - Use oil free soaps and lotions; these can be over the counter or store-brand - Don't use harsh scrubs or astringents, these can make skin irritation and acne worse - Moisturize daily with oil free lotion because the acne medicines will dry your skin

## 2021-01-16 ENCOUNTER — Telehealth: Payer: Self-pay | Admitting: *Deleted

## 2021-01-16 NOTE — Telephone Encounter (Signed)
-----   Message from Verta Ellen., NP sent at 01/15/2021  6:56 PM EDT ----- 12 day monitor showed rare fast heart rates. She had 16 episodes of fast heart rates during the 12 day period. The fastest one was 5 beats and the longest lasting was 19 beats. She had two rare episodes of what the monitor interpreted as ventricular tachycardia but physician determined them to be similar to the other episodes. Patient triggered the monitor but did not report what symptoms she may have been experiencing per the physician reading the report.  Thanks

## 2021-01-16 NOTE — Telephone Encounter (Signed)
Laurine Blazer, LPN  0/91/9802 21:79 AM EDT Back to Top     Patient notified and verbalized understanding.  States that her pcp recently started her on Spironolactone 50mg  twice a day due to adrenal gland. Medication list updated.

## 2021-01-21 ENCOUNTER — Other Ambulatory Visit: Payer: Self-pay

## 2021-01-21 ENCOUNTER — Ambulatory Visit (INDEPENDENT_AMBULATORY_CARE_PROVIDER_SITE_OTHER): Admitting: Sports Medicine

## 2021-01-21 VITALS — BP 126/81 | Ht 65.0 in | Wt 240.0 lb

## 2021-01-21 DIAGNOSIS — M76812 Anterior tibial syndrome, left leg: Secondary | ICD-10-CM | POA: Diagnosis not present

## 2021-01-21 MED ORDER — MELOXICAM 15 MG PO TABS: ORAL_TABLET | ORAL | 0 refills | Status: DC

## 2021-01-21 NOTE — Progress Notes (Signed)
   PCP: [name], DO  Subjective:       Objective:      Assessment & Plan:

## 2021-01-21 NOTE — Progress Notes (Signed)
   Subjective:    Patient ID: Miranda Perkins, female    DOB: 18-Mar-1961, 60 y.o.   MRN: [id number]  HPI   Patient comes in today for follow-up on left foot and ankle pain secondary to anterior tibialis tendinitis.  She is doing great.  She has been in her cam walker since her last office visit.  She states that about a day and a half after immobilization and starting meloxicam she began to notice improvement.  She also states that the meloxicam helped with some chronic left knee pain that she has had since having a total knee arthroplasty.  In fact, the meloxicam has been so helpful she is requesting a refill.    Review of Systems As above    Objective:   Physical Exam  Developed, well nourished.  No acute distress  Left ankle: Full active and passive range of motion.  No soft tissue swelling.  No tenderness to palpation along the anterior tibialis tendon.  No pain with resisted dorsiflexion of the ankle.  Good pulses.      Assessment & Plan:   Resolved left ankle/foot pain secondary to anterior tibialis tendinitis  Patient will discontinue her cam walker.  I did recommend a compression sleeve with activity for the next week or two.  I will also refill her meloxicam.  She understands that she is to take it only as needed.  Follow-up as needed.

## 2021-01-24 ENCOUNTER — Other Ambulatory Visit: Payer: Self-pay

## 2021-01-24 ENCOUNTER — Ambulatory Visit (HOSPITAL_BASED_OUTPATIENT_CLINIC_OR_DEPARTMENT_OTHER)
Admission: RE | Admit: 2021-01-24 | Discharge: 2021-01-24 | Disposition: A | Discharge status: Home / Self Care | Source: Ambulatory Visit | Attending: Family Medicine | Admitting: Family Medicine

## 2021-01-24 ENCOUNTER — Encounter (HOSPITAL_COMMUNITY): Payer: Self-pay

## 2021-01-24 ENCOUNTER — Ambulatory Visit (HOSPITAL_COMMUNITY)
Admission: RE | Admit: 2021-01-24 | Discharge: 2021-01-24 | Disposition: A | Discharge status: Home / Self Care | Source: Ambulatory Visit | Attending: Family Medicine | Admitting: Family Medicine

## 2021-01-24 ENCOUNTER — Telehealth: Payer: Self-pay | Admitting: *Deleted

## 2021-01-24 DIAGNOSIS — R0602 Shortness of breath: Secondary | ICD-10-CM | POA: Diagnosis not present

## 2021-01-24 DIAGNOSIS — R079 Chest pain, unspecified: Secondary | ICD-10-CM

## 2021-01-24 LAB — NM MYOCAR MULTI W/SPECT W/WALL MOTION / EF
LV dias vol: 60 mL (ref 46–106)
LV sys vol: 16 mL
Peak HR: 108 {beats}/min
RATE: 0.57
Rest HR: 75 {beats}/min
SDS: 3
SRS: 1
SSS: 4
TID: 0.96

## 2021-01-24 LAB — ECHOCARDIOGRAM COMPLETE
Area-P 1/2: 2.87 cm2
S' Lateral: 2.3 cm

## 2021-01-24 MED ORDER — TECHNETIUM TC 99M TETROFOSMIN IV KIT
30.0000 | PACK | Freq: Once | INTRAVENOUS | Status: AC | PRN
  Administered 2021-01-24: 30.7 via INTRAVENOUS

## 2021-01-24 MED ORDER — SODIUM CHLORIDE FLUSH 0.9 % IV SOLN
INTRAVENOUS | Status: AC
  Administered 2021-01-24: 10 mL via INTRAVENOUS
  Filled 2021-01-24: qty 10

## 2021-01-24 MED ORDER — TECHNETIUM TC 99M TETROFOSMIN IV KIT
10.0000 | PACK | Freq: Once | INTRAVENOUS | Status: AC | PRN
  Administered 2021-01-24: 10.78 via INTRAVENOUS

## 2021-01-24 MED ORDER — REGADENOSON 0.4 MG/5ML IV SOLN
INTRAVENOUS | Status: AC
  Administered 2021-01-24: 0.4 mg via INTRAVENOUS
  Filled 2021-01-24: qty 5

## 2021-01-24 NOTE — Telephone Encounter (Signed)
Laurine Blazer, LPN  6/72/0947 0:96 PM EDT Back to Top     Full mailbox.

## 2021-01-24 NOTE — Telephone Encounter (Signed)
ECHO -  Verta Ellen., NP  01/24/2021 4:19 PM EDT      Please call the patient and tell her the echocardiogram showed the pumping function of her heart looks good.. She has a very mildly leaking valve. Nothing clinically significant that would cause shortness of breath on the echocardiogram.   STRESS TEST -   Verta Ellen., NP  01/24/2021 4:20 PM EDT      Please call the patient and let her know the stress test did not show any evidence of lack of blood flow through the coronary arteries in her heart. Pumping function was good. The interpreting physician deemed it to be a low risk study.

## 2021-01-24 NOTE — Progress Notes (Signed)
*  PRELIMINARY RESULTS* Echocardiogram 2D Echocardiogram has been performed.  Miranda Perkins 01/24/2021, 3:00 PM

## 2021-01-28 NOTE — Telephone Encounter (Signed)
Laurine Blazer, LPN  0/38/8828 0:03 PM EDT Back to Top     Notified, copy to pcp.

## 2021-01-30 ENCOUNTER — Other Ambulatory Visit: Payer: Self-pay

## 2021-01-30 ENCOUNTER — Ambulatory Visit (INDEPENDENT_AMBULATORY_CARE_PROVIDER_SITE_OTHER): Admitting: Cardiology

## 2021-01-30 ENCOUNTER — Encounter: Payer: Self-pay | Admitting: Cardiology

## 2021-01-30 VITALS — BP 110/80 | HR 80 | Ht 66.0 in | Wt 247.6 lb

## 2021-01-30 DIAGNOSIS — R079 Chest pain, unspecified: Secondary | ICD-10-CM | POA: Diagnosis not present

## 2021-01-30 DIAGNOSIS — R0602 Shortness of breath: Secondary | ICD-10-CM | POA: Diagnosis not present

## 2021-01-30 DIAGNOSIS — R002 Palpitations: Secondary | ICD-10-CM | POA: Diagnosis not present

## 2021-01-30 DIAGNOSIS — R600 Localized edema: Secondary | ICD-10-CM

## 2021-01-30 NOTE — Patient Instructions (Addendum)
Your physician recommends that you schedule a follow-up appointment in: [place]  Your physician recommends that you continue on your current medications as directed. Please refer to the Current Medication list given to you today.  Your physician has recommended that you have a pulmonary function test. Pulmonary Function Tests are a group of tests that measure how well air moves in and out of your lungs.  Thank you for choosing [facility]!!

## 2021-01-30 NOTE — Progress Notes (Signed)
Clinical Summary Miranda Perkins is a 60 y.o.female  seen today for follow up of the following medical problems.   1.SOB/Leg edema - started about 4 months ago - DOE with walking from car to house.  - bilateral swelling legs. +orthopnea - no coughing, no wheezing. Former smoker 30 years.  - has been on HCTZ x 1 monthwithout imprpvement - weight up 8 lbs since 08/2018  07/2018 echo LVEF 86-76%, normal diastolic function - last visit we stopped HCTZ and started lasix 40mg  daily. Did not have much weight loss, we increased to 60mg  daily. On this regimen dropped from 243 to 239 in about a week. Follow up labs were stable    12/2020 echo: LVEF 65-70%, no WMAs, indet dd, normal RV 12/2020 nuclear stress: no ischemia - former smoker x 20 years. Some ongoing SOB/DOE -   2. Palpitations 12/2020 event monitor: short runs of SVT, rare - infrequent episodes since wearing monitor   3. Chest pain 12/2020 nuclear stress: no ischemia   SH: husband is Miranda Perkins  Has  2 25 yo grandchildren that live with her   Past Medical History:  Diagnosis Date  . Arthritis   . Cancer ([place]) 2016   basal cell, forehead  . 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 Depression    pt denies   . Multiple thyroid nodules   . Osteopenia 12/2017   T score -1.2 FRAX 4.9% / 0.3%  . Pneumonia    hx of x 2 as a child   . Stroke ([place]) 1993   while on Chloramphenicol, no residuaL; [MEDICATION WAS GIVEN TO TREAT TICK BITE] ; patient states  "i stroked out right next to my doctor , he said i passed out and that was my only symptom" ;      Allergies  Allergen Reactions  . Influenza Vaccines Anaphylaxis and Other (See Comments)    Per patient  . Penicillins Anaphylaxis and Other (See Comments)    PATIENT HAS HAD A PCN REACTION WITH IMMEDIATE RASH, FACIAL/TONGUE/THROAT SWELLING, SOB, OR LIGHTHEADEDNESS WITH HYPOTENSION:  #  #  #  YES  #  #  #   Has patient had a PCN reaction causing severe rash  involving mucus membranes or skin necrosis: No PATIENT HAS HAD A PCN REACTION THAT REQUIRED HOSPITALIZATION:  #  #  #  YES  #  #  #  Has patient had a PCN reaction occurring within the last 10 years: No   . Other     bandaids- skin turns red and a rach   . Cortisone Other (See Comments)    Turned red and ran a low grade fever for 3 days     Current Outpatient Medications  Medication Sig Dispense Refill  . albuterol (VENTOLIN HFA) 108 (90 Base) MCG/ACT inhaler Inhale 2 puffs into the lungs every 6 (six) hours as needed. 18 g 2  . meloxicam (MOBIC) 15 MG tablet Take one pill a day with food as needed. 30 tablet 0  . spironolactone (ALDACTONE) 50 MG tablet Take 1 tablet (50 mg total) by mouth 2 (two) times daily. 180 tablet 0   No current facility-administered medications for this visit.     Past Surgical History:  Procedure Laterality Date  . ANTERIOR AND POSTERIOR REPAIR N/A 08/21/2020   Procedure: ANTERIOR (CYSTOCELE) AND POSTERIOR REPAIR (RECTOCELE);  Surgeon: Miranda Bruins, MD;  Location: [facility];  Service: Gynecology;  Laterality: N/A;  requesting 9:00am OR time  requests one hour  . ANTERIOR CERVICAL DECOMP/DISCECTOMY FUSION N/A 09/27/2017   Procedure: ANTERIOR CERVICAL DECOMPRESSION/DISCECTOMY FUSION CERVICAL FOUR-FIVE ,CERVICAL FIVE-SIX,CERVICAL SIX-SEVEN;  Surgeon: Miranda Kos, MD;  Location: [facility];  Service: Neurosurgery;  Laterality: N/A;  . CARPAL TUNNEL RELEASE Left 09/27/2017   Procedure: CARPAL TUNNEL RELEASE;  Surgeon: Miranda Kos, MD;  Location: [facility];  Service: Neurosurgery;  Laterality: Left;  . FOOT SURGERY     bone removed from pinky toe  . HERNIA REPAIR    . KNEE SURGERY Left 06/21/2017   dr Miranda Perkins; meniscus tear   . LEG SURGERY  1972  . MOUTH SURGERY    . skin cancer removal    . TONSILLECTOMY     1979?  . TOTAL KNEE ARTHROPLASTY Left 01/26/2018   Procedure: LEFT TOTAL KNEE ARTHROPLASTY;  Surgeon: Miranda Maudlin, MD;  Location: [facility];  Service: Orthopedics;  Laterality: Left;  . TUBAL LIGATION       Allergies  Allergen Reactions  . Influenza Vaccines Anaphylaxis and Other (See Comments)    Per patient  . Penicillins Anaphylaxis and Other (See Comments)    PATIENT HAS HAD A PCN REACTION WITH IMMEDIATE RASH, FACIAL/TONGUE/THROAT SWELLING, SOB, OR LIGHTHEADEDNESS WITH HYPOTENSION:  #  #  #  YES  #  #  #   Has patient had a PCN reaction causing severe rash involving mucus membranes or skin necrosis: No PATIENT HAS HAD A PCN REACTION THAT REQUIRED HOSPITALIZATION:  #  #  #  YES  #  #  #  Has patient had a PCN reaction occurring within the last 10 years: No   . Other     bandaids- skin turns red and a rach   . Cortisone Other (See Comments)    Turned red and ran a low grade fever for 3 days      Family History  Problem Relation Age of Onset  . Rheum arthritis Sister   . Clotting disorder Sister   . Rheum arthritis Sister   . Thyroid disease Sister   . Clotting disorder Maternal Aunt   . Clotting disorder Maternal Aunt   . Heart Problems Mother   . Rheum arthritis Mother   . Cancer Father        ? type  . Cancer Paternal Grandfather        Brain cancer     Social History Ms. Minney reports that she quit smoking about 11 years ago. Her smoking use included cigarettes. She smoked 0.50 packs per day. She has never used smokeless tobacco. Ms. Marasco reports current alcohol use.   Review of Systems CONSTITUTIONAL: No weight loss, fever, chills, weakness or fatigue.  HEENT: Eyes: No visual loss, blurred vision, double vision or yellow sclerae.No hearing loss, sneezing, congestion, runny nose or sore throat.  SKIN: No rash or itching.  CARDIOVASCULAR: per hpi RESPIRATORY: per hpi GASTROINTESTINAL: No anorexia, nausea, vomiting or diarrhea. No abdominal pain or blood.  GENITOURINARY: No burning on urination, no polyuria NEUROLOGICAL: No headache, dizziness, syncope, paralysis, ataxia, numbness or  tingling in the extremities. No change in bowel or bladder control.  MUSCULOSKELETAL: No muscle, back pain, joint pain or stiffness.  LYMPHATICS: No enlarged nodes. No history of splenectomy.  PSYCHIATRIC: No history of depression or anxiety.  ENDOCRINOLOGIC: No reports of sweating, cold or heat intolerance. No polyuria or polydipsia.  [name]   Physical Examination Today's Vitals   01/30/21 0900  BP: 110/80  Pulse: 80  SpO2: 98%  Weight: 247 lb 9.6 oz (112.3 kg)  Height: 5\' 6"  (1.676 m)   Body mass index is 39.96 kg/m.  Gen: resting comfortably, no acute distress HEENT: no scleral icterus, pupils equal round and reactive, no palptable cervical adenopathy,  CV: RRR, no m/r/g, no jvd Resp: Clear to auscultation bilaterally GI: abdomen is soft, non-tender, non-distended, normal bowel sounds, no hepatosplenomegaly MSK: extremities are warm, no edema.  Skin: warm, no rash Neuro:  no focal deficits Psych: appropriate affect   Diagnostic Studies 07/2018 echo Study Conclusions  - Left ventricle: The cavity size was normal. Wall thickness was increased in a pattern of mild LVH. Systolic function was normal. The estimated ejection fraction was in the range of 60% to 65%. Wall motion was normal; there were no regional wall motion abnormalities. Left ventricular diastolic function parameters were normal. - Mitral valve: There was trivial regurgitation. - Right atrium: Central venous pressure (est): 3 mm Hg. - Atrial septum: No defect or patent foramen ovale was identified. - Tricuspid valve: There was trivial regurgitation. - Pulmonary arteries: Systolic pressure could not be accurately estimated. - Pericardium, extracardiac: A prominent pericardial fat pad was present, rule out trivial pericardial effusion.  Impressions:  - A prominent pericardial fat pad was present, rule out trivial pericardial effusion. If concern remains about epicardial mass, a chest CT  would likely clarify this further.    Assessment and Plan  1. SOB - recent benign echo and stress test - with prior smoking history will order PFTs  2. LE edema - started on aldactone for hirsutism by pcp, off lasix for now. Aldactone seems to be controlling her swelling alone  3. Chest pain - negative stress test, no further workup at this time.   4. Palpitations - fairly benign event monitor, rare ectopy and SVT. Infrequent symptoms, monitor at this time.     Arnoldo Lenis, M.D.

## 2021-02-10 ENCOUNTER — Other Ambulatory Visit: Payer: Self-pay

## 2021-02-10 ENCOUNTER — Other Ambulatory Visit (HOSPITAL_COMMUNITY)
Admission: RE | Admit: 2021-02-10 | Discharge: 2021-02-10 | Disposition: A | Discharge status: Home / Self Care | Source: Ambulatory Visit | Attending: Cardiology | Admitting: Cardiology

## 2021-02-10 DIAGNOSIS — Z20822 Contact with and (suspected) exposure to covid-19: Secondary | ICD-10-CM | POA: Insufficient documentation

## 2021-02-10 DIAGNOSIS — Z01812 Encounter for preprocedural laboratory examination: Secondary | ICD-10-CM | POA: Diagnosis not present

## 2021-02-10 LAB — SARS CORONAVIRUS 2 (TAT 6-24 HRS): SARS Coronavirus 2: NEGATIVE

## 2021-02-11 ENCOUNTER — Ambulatory Visit (HOSPITAL_COMMUNITY)
Admission: RE | Admit: 2021-02-11 | Discharge: 2021-02-11 | Disposition: A | Discharge status: Home / Self Care | Source: Ambulatory Visit | Attending: Cardiology | Admitting: Cardiology

## 2021-02-11 DIAGNOSIS — R0602 Shortness of breath: Secondary | ICD-10-CM | POA: Diagnosis not present

## 2021-02-11 LAB — PULMONARY FUNCTION TEST
DL/VA % pred: 115 %
DL/VA: 4.82 ml/min/mmHg/L
DLCO unc % pred: 77 %
DLCO unc: 16 ml/min/mmHg
FEF 25-75 Post: 2.15 L/sec
FEF 25-75 Pre: 1.66 L/sec
FEF2575-%Change-Post: 29 %
FEF2575-%Pred-Post: 92 %
FEF2575-%Pred-Pre: 71 %
FEV1-%Change-Post: 4 %
FEV1-%Pred-Post: 67 %
FEV1-%Pred-Pre: 64 %
FEV1-Post: 1.76 L
FEV1-Pre: 1.68 L
FEV1FVC-%Change-Post: 4 %
FEV1FVC-%Pred-Pre: 105 %
FEV6-%Change-Post: 0 %
FEV6-%Pred-Post: 63 %
FEV6-%Pred-Pre: 63 %
FEV6-Post: 2.06 L
FEV6-Pre: 2.05 L
FEV6FVC-%Pred-Post: 103 %
FEV6FVC-%Pred-Pre: 103 %
FVC-%Change-Post: 0 %
FVC-%Pred-Post: 61 %
FVC-%Pred-Pre: 60 %
FVC-Post: 2.06 L
FVC-Pre: 2.05 L
Post FEV1/FVC ratio: 85 %
Post FEV6/FVC ratio: 100 %
Pre FEV1/FVC ratio: 82 %
Pre FEV6/FVC Ratio: 100 %
RV % pred: 92 %
RV: 1.92 L
TLC % pred: 83 %
TLC: 4.34 L

## 2021-02-11 MED ORDER — ALBUTEROL SULFATE (2.5 MG/3ML) 0.083% IN NEBU
2.5000 mg | INHALATION_SOLUTION | Freq: Once | RESPIRATORY_TRACT | Status: AC
  Administered 2021-02-11: 2.5 mg via RESPIRATORY_TRACT

## 2021-02-18 ENCOUNTER — Telehealth: Payer: Self-pay | Admitting: *Deleted

## 2021-02-18 ENCOUNTER — Other Ambulatory Visit: Payer: Self-pay | Admitting: Family Medicine

## 2021-02-18 DIAGNOSIS — R0600 Dyspnea, unspecified: Secondary | ICD-10-CM

## 2021-02-18 DIAGNOSIS — R0609 Other forms of dyspnea: Secondary | ICD-10-CM

## 2021-02-18 DIAGNOSIS — J449 Chronic obstructive pulmonary disease, unspecified: Secondary | ICD-10-CM

## 2021-02-18 NOTE — Telephone Encounter (Signed)
Pt aware and did already speak to pcp nurse regarding referral

## 2021-02-18 NOTE — Telephone Encounter (Signed)
-----   Message from Arnoldo Lenis, MD sent at 02/18/2021  9:25 AM EDT ----- Please let patient know that breathing tests were abnormal showing some signs of COPD. I will forward to her pcp to consider management vs pulm referal.This may be the cause of her SOB since heart testing has been fairly benign   Carlyle Dolly MD

## 2021-02-26 ENCOUNTER — Encounter

## 2021-03-03 ENCOUNTER — Other Ambulatory Visit (HOSPITAL_COMMUNITY): Payer: Self-pay

## 2021-03-18 ENCOUNTER — Institutional Professional Consult (permissible substitution): Admitting: Internal Medicine

## 2021-03-18 NOTE — Progress Notes (Deleted)
   Miranda Perkins, female    DOB: 12-26-1960,   MRN: [id number]   Brief patient profile:    PFT's  02/11/21 nl x for ERV = 14%  @ wt 240   History of Present Illness  03/18/2021  Pulmonary/ 1st office eval/ Miranda Perkins / [facility]  No chief complaint on file.    Dyspnea:  *** Cough: *** Sleep: *** SABA use:   Past Medical History:  Diagnosis Date  . Arthritis   . Cancer ([place]) 2016   basal cell, forehead  . 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 Depression    pt denies   . Multiple thyroid nodules   . Osteopenia 12/2017   T score -1.2 FRAX 4.9% / 0.3%  . Pneumonia    hx of x 2 as a child   . Stroke ([place]) 1993   while on Chloramphenicol, no residuaL; [MEDICATION WAS GIVEN TO TREAT TICK BITE] ; patient states  "i stroked out right next to my doctor , he said i passed out and that was my only symptom" ;     Outpatient Medications Prior to Visit  Medication Sig Dispense Refill  . albuterol (VENTOLIN HFA) 108 (90 Base) MCG/ACT inhaler Inhale 2 puffs into the lungs every 6 (six) hours as needed. 18 g 2  . doxycycline (VIBRAMYCIN) 50 MG capsule Take 50 mg by mouth daily. As directed    . meloxicam (MOBIC) 15 MG tablet Take one pill a day with food as needed. 30 tablet 0  . Molnupiravir 200 MG CAPS TAKE 4 CAPSULES (800 MG TOTAL) BY MOUTH 2 (TWO) TIMES DAILY FOR 5 DAYS. 40 capsule 0  . Oyster Shell (OYSTER CALCIUM) 500 MG TABS tablet Take 1,000 mg of elemental calcium by mouth daily.    [name] spironolactone (ALDACTONE) 50 MG tablet Take 1 tablet (50 mg total) by mouth 2 (two) times daily. 180 tablet 0  . VITAMIN D, CHOLECALCIFEROL, PO Take 500 mg by mouth daily at 6 (six) AM.     No facility-administered medications prior to visit.     Objective:     There were no vitals taken for this visit.         Assessment   No problem-specific Assessment & Plan notes found for this encounter.     Christinia Gully, MD 03/18/2021

## 2021-03-20 ENCOUNTER — Institutional Professional Consult (permissible substitution): Admitting: Internal Medicine

## 2021-03-20 ENCOUNTER — Encounter: Payer: Self-pay | Admitting: Internal Medicine

## 2021-03-20 ENCOUNTER — Ambulatory Visit (INDEPENDENT_AMBULATORY_CARE_PROVIDER_SITE_OTHER): Admitting: Internal Medicine

## 2021-03-20 ENCOUNTER — Ambulatory Visit (HOSPITAL_COMMUNITY)
Admission: RE | Admit: 2021-03-20 | Discharge: 2021-03-20 | Disposition: A | Discharge status: Home / Self Care | Source: Ambulatory Visit | Attending: Internal Medicine | Admitting: Internal Medicine

## 2021-03-20 ENCOUNTER — Other Ambulatory Visit (HOSPITAL_COMMUNITY)
Admission: RE | Admit: 2021-03-20 | Discharge: 2021-03-20 | Disposition: A | Discharge status: Home / Self Care | Source: Ambulatory Visit | Attending: Internal Medicine | Admitting: Internal Medicine

## 2021-03-20 ENCOUNTER — Other Ambulatory Visit: Payer: Self-pay

## 2021-03-20 DIAGNOSIS — R0609 Other forms of dyspnea: Secondary | ICD-10-CM

## 2021-03-20 DIAGNOSIS — R0600 Dyspnea, unspecified: Secondary | ICD-10-CM | POA: Diagnosis not present

## 2021-03-20 DIAGNOSIS — R058 Other specified cough: Secondary | ICD-10-CM | POA: Insufficient documentation

## 2021-03-20 LAB — CBC WITH DIFFERENTIAL/PLATELET
Abs Immature Granulocytes: 0.02 10*3/uL (ref 0.00–0.07)
Basophils Absolute: 0 10*3/uL (ref 0.0–0.1)
Basophils Relative: 0 %
Eosinophils Absolute: 0.2 10*3/uL (ref 0.0–0.5)
Eosinophils Relative: 2 %
HCT: 44.1 % (ref 36.0–46.0)
Hemoglobin: 14.6 g/dL (ref 12.0–15.0)
Immature Granulocytes: 0 %
Lymphocytes Relative: 28 %
Lymphs Abs: 1.9 10*3/uL (ref 0.7–4.0)
MCH: 29.7 pg (ref 26.0–34.0)
MCHC: 33.1 g/dL (ref 30.0–36.0)
MCV: 89.6 fL (ref 80.0–100.0)
Monocytes Absolute: 0.6 10*3/uL (ref 0.1–1.0)
Monocytes Relative: 9 %
Neutro Abs: 4.2 10*3/uL (ref 1.7–7.7)
Neutrophils Relative %: 61 %
Platelets: 261 10*3/uL (ref 150–400)
RBC: 4.92 MIL/uL (ref 3.87–5.11)
RDW: 14 % (ref 11.5–15.5)
WBC: 6.9 10*3/uL (ref 4.0–10.5)
nRBC: 0 % (ref 0.0–0.2)

## 2021-03-20 LAB — D-DIMER, QUANTITATIVE: D-Dimer, Quant: 0.42 ug/mL-FEU (ref 0.00–0.50)

## 2021-03-20 LAB — SEDIMENTATION RATE: Sed Rate: 4 mm/hr (ref 0–22)

## 2021-03-20 MED ORDER — PANTOPRAZOLE SODIUM 40 MG PO TBEC: 40.0000 mg | DELAYED_RELEASE_TABLET | Freq: Every day | ORAL | 2 refills | Status: DC

## 2021-03-20 MED ORDER — FAMOTIDINE 20 MG PO TABS: ORAL_TABLET | ORAL | 11 refills | Status: DC

## 2021-03-20 NOTE — Assessment & Plan Note (Addendum)
Onset with covid feb 2022 -  Max rx for gerd 03/20/2021  - Allergy profile 03/20/2021 >  Eos 0.2 /  IgE  Pending   Of the three most common causes of  Sub-acute / recurrent or chronic cough, only one (GERD)  can actually contribute to/ trigger  the other two (asthma and post nasal drip syndrome)  and perpetuate the cylce of cough.  While not intuitively obvious, many patients with chronic low grade reflux do not cough until there is a primary insult that disturbs the protective epithelial barrier and exposes sensitive nerve endings.   This is typically viral(post covid fits)  but can due to PNDS and  either may apply here.   The point is that once this occurs, it is difficult to eliminate the cycle  using anything but a maximally effective acid suppression regimen at least in the short run, accompanied by an appropriate diet to address non acid GERD and control / eliminate the throat clearing with use of non-mint,menthol hard rock candies and regroup in 6 weeks    Each maintenance medication was reviewed in detail including emphasizing most importantly the difference between maintenance and prns and under what circumstances the prns are to be triggered using an action plan format where appropriate.  Total time for H and P, chart review, counseling,  directly observing portions of ambulatory 02 saturation study/ and generating customized AVS unique to this office visit / same day charting = 54 min

## 2021-03-20 NOTE — Assessment & Plan Note (Addendum)
Onset 2020 with wt gain and L knee surgery  - worse p covid19  Feb 2022  - 12/2020 echo: LVEF 65-70%, no WMAs, indet dd, normal RV - 12/2020 nuclear stress: no ischemia - PFT's  03/20/2021  FEV1 1.76 (67 % ) ratio 0.85  p 4 % improvement from saba p 0 prior to study with DLCO  16.00 (77%) corrects to 4.82 (114%)  for alv volume and FV curve nl and ERV 14% at wt 240  -  03/20/2021   Walked RA  approx   600 ft  @ moderate pace  stopped due to  End of study with sob at 400 ft but sats 98% at end     Symptoms are   disproportionate to objective findings and not clear to what extent this is actually a pulmonary  problem but pt does appear to have difficult to sort out respiratory symptoms of unknown origin for which  DDX  = almost all start with A and  include Adherence, Ace Inhibitors, Acid Reflux, Active Sinus Disease, Alpha 1 Antitripsin deficiency, Anxiety masquerading as Airways dz,  ABPA,  Allergy(esp in young), Aspiration (esp in elderly), Adverse effects of meds,  Active smoking or Vaping, A bunch of PE's/clot burden (a few small clots can't cause this syndrome unless there is already severe underlying pulm or vascular dz with poor reserve),  Anemia or thyroid disorder, plus two Bs  = Bronchiectasis and Beta blocker use..and one C= CHF   Adherence is always the initial "prime suspect" and is a multilayered concern that requires a "trust but verify" approach in every patient - starting with knowing how to use medications, especially inhalers, correctly, keeping up with refills and understanding the fundamental difference between maintenance and prns vs those medications only taken for a very short course and then stopped and not refilled.   ? Acid (or non-acid) GERD > always difficult to exclude as up to 75% of pts in some series report no assoc GI/ Heartburn symptoms> rec max (24h)  acid suppression and diet restrictions/ reviewed and instructions given in writing.   ? Anxiety/depression/  deconditioningwt gain related > usually at the bottom of this list of usual suspects but   may interfere with adherence and also interpretation of response or lack thereof to symptom management which can be quite subjective.  >>>  reconditioning outlined, good enthusiasm noted   ? Anemia/ thyroid dz > ruled out today  ? A bunch of PEs> D dimer nl - while a normal value  may miss small peripheral pe, the clot burden with sob is moderately high and the d dimer  has a very high neg pred value if used in this setting.     ? Cardiac  > ruled out as above

## 2021-03-20 NOTE — Progress Notes (Signed)
[name], female    DOB: 1961-03-04     MRN: [id number]   Brief patient profile:  45 yowf works making nautical charts quit smoking in 03/03/2010 s sequelae and able to garden up to 2-3 h  Per day at wt baseline 150 when daughter passed away in 2014-03-03 and since then much less active, slowed by L knee with further wt gain up to 250 assoc with doe onset at covid 21 Dec 2020 in bed x 8 days with fatigue/sob/cough  rx paxlovid and then more sob /cough so referred to pulmonary clinic in [place]  03/20/2021 by Dr    Ladonna Snide with pfts 02/11/21 c/w restrictive chnges only and ERV 14%      History of Present Illness  03/20/2021  Pulmonary/ 1st office eval/ Tyianna Mene[facility] / [facility]  Chief Complaint  Patient presents with  . Consult    Here to go over PFT results, shortness of breath with activity since having COVID in February 2022  Dyspnea:  Pushing cart at [employer] no problem, no hc parking slow pace but if walks s cart no real progression since covid  Has treadmill  Cough: variably productive mucoid min vol worse in am's sometimes wakes her up  Sleep: bed is flat/ on side / one pillow  SABA use: twice in past week p supper   No obvious day to day or daytime variability or assoc   purulent sputum or mucus plugs or hemoptysis or cp or chest tightness, subjective wheeze or overt sinus or hb symptoms.     Also denies any obvious fluctuation of symptoms with weather or environmental changes or other aggravating or alleviating factors except as outlined above   No unusual exposure hx or h/o childhood pna/ asthma or knowledge of premature birth.  Current Allergies, Complete Past Medical History, Past Surgical History, Family History, and Social History were reviewed in [employer] record.  ROS  The following are not active complaints unless bolded Hoarseness, sore throat, dysphagia, dental problems, itching, sneezing,  nasal congestion or discharge of excess mucus or  purulent secretions, ear ache,   fever, chills, sweats, unintended wt loss or wt gain, classically pleuritic or exertional cp,  orthopnea pnd or arm/hand swelling  or leg swelling, presyncope, palpitations, abdominal pain, anorexia, nausea, vomiting, diarrhea  or change in bowel habits or change in bladder habits, change in stools or change in urine, dysuria, hematuria,  rash, arthralgias, visual complaints, headache, numbness, weakness or ataxia or problems with walking or coordination,  change in mood or  memory.           Past Medical History:  Diagnosis Date  . Arthritis   . 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 Depression    pt denies   . Multiple thyroid nodules   . Osteopenia 12/2017   T score -1.2 FRAX 4.9% / 0.3%  . Pneumonia    hx of x 2 as a child   . Stroke ([place]) 1993   while on Chloramphenicol, no residuaL; [MEDICATION WAS GIVEN TO TREAT TICK BITE] ; patient states  "i stroked out right next to my doctor , he said i passed out and that was my only symptom" ;     Outpatient Medications Prior to Visit  Medication Sig Dispense Refill  . albuterol (VENTOLIN HFA) 108 (90 Base) MCG/ACT inhaler Inhale 2 puffs into the lungs every 6 (six)  hours as needed. 18 g 2  . doxycycline (VIBRAMYCIN) 50 MG capsule Take 50 mg by mouth daily. As directed    . meloxicam (MOBIC) 15 MG tablet Take one pill a day with food as needed. 30 tablet 0  . Oyster Shell (OYSTER CALCIUM) 500 MG TABS tablet Take 1,000 mg of elemental calcium by mouth daily.    [name] spironolactone (ALDACTONE) 50 MG tablet Take 1 tablet (50 mg total) by mouth 2 (two) times daily. 180 tablet 0  . VITAMIN D, CHOLECALCIFEROL, PO Take 500 mg by mouth daily at 6 (six) AM.          0   No facility-administered medications prior to visit.     Objective:     BP (!) 152/92 (BP Location: Left Arm, Cuff Size: Normal)   Pulse 77   Temp 97.8 F (36.6 C) (Temporal)   Ht 5\' 5"  (1.651 m)    Wt 250 lb (113.4 kg)   SpO2 98% Comment: Room air  BMI 41.60 kg/m   SpO2: 98 % (Room air)  Pleasant ambulatory obese wf nad   HEENT : pt wearing mask not removed for exam due to covid -19 concerns.    NECK :  without JVD/Nodes/TM/ nl carotid upstrokes bilaterally   LUNGS: no acc muscle use,  Nl contour chest which is clear to A and P bilaterally without cough on insp or exp maneuvers   CV:  RRR  no s3 or murmur or increase in P2, and no edema   ABD:  soft and nontender with nl inspiratory excursion in the supine position. No bruits or organomegaly appreciated, bowel sounds nl  MS:  Nl gait/ ext warm without deformities, calf tenderness, cyanosis or clubbing No obvious joint restrictions   SKIN: warm and dry without lesions    NEURO:  alert, approp, nl sensorium with  no motor or cerebellar deficits apparent.    CXR PA and Lateral:   03/20/2021 :    I personally reviewed images and  impression as follows:   No acute changes    Labs ordered/ reviewed:      Chemistry      Component Value Date/Time   NA 140 01/15/2021 0852   K 4.6 01/15/2021 0852   CL 104 01/15/2021 0852   CO2 22 01/15/2021 0852   BUN 18 01/15/2021 0852   CREATININE 1.01 (H) 01/15/2021 0852      Component Value Date/Time   CALCIUM 9.0 01/15/2021 0852   ALKPHOS 73 01/18/2019 1052   AST 13 01/18/2019 1052   ALT 25 01/18/2019 1052   BILITOT 0.4 01/18/2019 1052        Lab Results  Component Value Date   WBC 6.9 03/20/2021   HGB 14.6 03/20/2021   HCT 44.1 03/20/2021   MCV 89.6 03/20/2021   PLT 261 03/20/2021       EOS                                                               0.2                                    03/20/2021   Lab Results  Component Value  Date   DDIMER 0.42 03/20/2021      Lab Results  Component Value Date   TSH 2.160 01/02/2021     No results found for: PROBNP     Lab Results  Component Value Date   ESRSEDRATE 4 03/20/2021   ESRSEDRATE 9 01/18/2019    ESRSEDRATE 3 05/03/2017           Assessment   Upper airway cough syndrome vs cough variant asthma Onset with covid feb 2022 -  Max rx for gerd 03/20/2021  - Allergy profile 03/20/2021 >  Eos 0.2 /  IgE  Pending   Of the three most common causes of  Sub-acute / recurrent or chronic cough, only one (GERD)  can actually contribute to/ trigger  the other two (asthma and post nasal drip syndrome)  and perpetuate the cylce of cough.  While not intuitively obvious, many patients with chronic low grade reflux do not cough until there is a primary insult that disturbs the protective epithelial barrier and exposes sensitive nerve endings.   This is typically viral(post covid fits)  but can due to PNDS and  either may apply here.   The point is that once this occurs, it is difficult to eliminate the cycle  using anything but a maximally effective acid suppression regimen at least in the short run, accompanied by an appropriate diet to address non acid GERD and control / eliminate the throat clearing with use of non-mint,menthol hard rock candies and regroup in 6 weeks      DOE (dyspnea on exertion) Onset 2020 with wt gain and L knee surgery  - worse p covid19  Feb 2022  - 12/2020 echo: LVEF 65-70%, no WMAs, indet dd, normal RV - 12/2020 nuclear stress: no ischemia - PFT's  03/20/2021  FEV1 1.76 (67 % ) ratio 0.85  p 4 % improvement from saba p 0 prior to study with DLCO  16.00 (77%) corrects to 4.82 (114%)  for alv volume and FV curve nl and ERV 14% at wt 240  -  03/20/2021   Walked RA  approx   600 ft  @ moderate pace  stopped due to  End of study with sob at 400 ft but sats 98% at end     Symptoms are   disproportionate to objective findings and not clear to what extent this is actually a pulmonary  problem but pt does appear to have difficult to sort out respiratory symptoms of unknown origin for which  DDX  = almost all start with A and  include Adherence, Ace Inhibitors, Acid Reflux, Active Sinus  Disease, Alpha 1 Antitripsin deficiency, Anxiety masquerading as Airways dz,  ABPA,  Allergy(esp in young), Aspiration (esp in elderly), Adverse effects of meds,  Active smoking or Vaping, A bunch of PE's/clot burden (a few small clots can't cause this syndrome unless there is already severe underlying pulm or vascular dz with poor reserve),  Anemia or thyroid disorder, plus two Bs  = Bronchiectasis and Beta blocker use..and one C= CHF   Adherence is always the initial "prime suspect" and is a multilayered concern that requires a "trust but verify" approach in every patient - starting with knowing how to use medications, especially inhalers, correctly, keeping up with refills and understanding the fundamental difference between maintenance and prns vs those medications only taken for a very short course and then stopped and not refilled.   ? Acid (or non-acid) GERD > always difficult to exclude as up to  75% of pts in some series report no assoc GI/ Heartburn symptoms> rec max (24h)  acid suppression and diet restrictions/ reviewed and instructions given in writing.   ? Anxiety/depression/ deconditioningwt gain related > usually at the bottom of this list of usual suspects but   may interfere with adherence and also interpretation of response or lack thereof to symptom management which can be quite subjective.  >>>  reconditioning outlined, good enthusiasm noted   ? Anemia/ thyroid dz > ruled out today  ? A bunch of PEs> D dimer nl - while a normal value  may miss small peripheral pe, the clot burden with sob is moderately high and the d dimer  has a very high neg pred value if used in this setting.     ? Cardiac > ruled out as above      Each maintenance medication was reviewed in detail including emphasizing most importantly the difference between maintenance and prns and under what circumstances the prns are to be triggered using an action plan format where appropriate.  Total time for H and  P, chart review, counseling,  directly observing portions of ambulatory 02 saturation study/ and generating customized AVS unique to this office visit / same day charting = 54 min                     Christinia Gully, MD 03/20/2021

## 2021-03-20 NOTE — Patient Instructions (Addendum)
To get the most out of exercise, you need to be continuously aware that you are short of breath, but never out of breath, for at least 30 minutes daily. As you improve, it will actually be easier for you to do the same amount of exercise  in  30 minutes so always push to the level where you are short of breath.  Once you can do this, push for longer duration or repeat it after at least 4 hours of rest.  Make sure you check your oxygen saturations at highest level of activity   Pantoprazole (protonix) 40 mg   Take  30-60 min before first meal of the day and Pepcid (famotidine)  20 mg after supper until return to office - this is the best way to tell whether stomach acid is contributing to your problem.    GERD (REFLUX)  is an extremely common cause of respiratory symptoms just like yours , many times with no obvious heartburn at all.    It can be treated with medication, but also with lifestyle changes including elevation of the head of your bed (ideally with 6 -8inch blocks (about 10-30 degrees is fine) under the headboard of your bed),  Smoking cessation, avoidance of late meals, excessive alcohol, and avoid fatty foods, chocolate, peppermint, colas, red wine, and acidic juices such as orange juice.  NO MINT OR MENTHOL PRODUCTS SO NO COUGH DROPS  USE SUGARLESS CANDY INSTEAD (Jolley ranchers or Stover's or Life Savers) or even ice chips will also do - the key is to swallow to prevent all throat clearing. NO OIL BASED VITAMINS - use powdered substitutes.  Avoid fish oil when coughing.  Please remember to go to the lab and x-ray department at [facility]   for your tests - we will call you with the results when they are available.      Please schedule a follow up office visit in 6 weeks, call sooner if needed

## 2021-03-21 NOTE — Progress Notes (Signed)
Called and went over lab results per Dr Wert with patient. All questions answered and patient expressed full understanding. Nothing further needed at this time.

## 2021-03-24 ENCOUNTER — Encounter: Payer: Self-pay | Admitting: *Deleted

## 2021-03-26 ENCOUNTER — Other Ambulatory Visit: Payer: Self-pay

## 2021-03-26 ENCOUNTER — Encounter: Payer: Self-pay | Admitting: Family Medicine

## 2021-03-26 ENCOUNTER — Ambulatory Visit
Admission: RE | Admit: 2021-03-26 | Discharge: 2021-03-26 | Disposition: A | Discharge status: Home / Self Care | Source: Ambulatory Visit | Attending: Family Medicine | Admitting: Family Medicine

## 2021-03-26 DIAGNOSIS — Z1231 Encounter for screening mammogram for malignant neoplasm of breast: Secondary | ICD-10-CM

## 2021-03-27 ENCOUNTER — Ambulatory Visit: Admitting: Family

## 2021-03-27 ENCOUNTER — Other Ambulatory Visit: Payer: Self-pay

## 2021-03-27 ENCOUNTER — Encounter: Payer: Self-pay | Admitting: Family

## 2021-03-27 VITALS — BP 120/77 | HR 82 | Temp 97.4°F | Ht 65.0 in | Wt 248.2 lb

## 2021-03-27 DIAGNOSIS — R319 Hematuria, unspecified: Secondary | ICD-10-CM | POA: Diagnosis not present

## 2021-03-27 DIAGNOSIS — R109 Unspecified abdominal pain: Secondary | ICD-10-CM

## 2021-03-27 LAB — URINALYSIS, COMPLETE
Bilirubin, UA: NEGATIVE
Glucose, UA: NEGATIVE
Ketones, UA: NEGATIVE
Leukocytes,UA: NEGATIVE
Nitrite, UA: NEGATIVE
Protein,UA: NEGATIVE
Specific Gravity, UA: 1.01 (ref 1.005–1.030)
Urobilinogen, Ur: 0.2 mg/dL (ref 0.2–1.0)
pH, UA: 6 (ref 5.0–7.5)

## 2021-03-27 LAB — MICROSCOPIC EXAMINATION

## 2021-03-27 LAB — IGE: IgE (Immunoglobulin E), Serum: <2 IU/mL — ABNORMAL LOW (ref 6–495)

## 2021-03-27 NOTE — Progress Notes (Signed)
Subjective:    Patient ID: Miranda Perkins, female    DOB: [birth year], 60 y.o.   MRN: [id number]  Chief Complaint  Patient presents with  . Back Pain    Started yesterday and the pain 10 out of 10 yesterday. Today is a little better 1 out of 10 day  . Hematuria    Started started yesterday none today    Pt presents to the office today with flank pain and hematuria that started yesterday. She reports the pain resolved last night. She states she drink lots of fluid.  Back Pain This is a new problem. The current episode started yesterday. Pain location: flank. The pain is at a severity of 10/10 (0 now). The pain is severe. Treatments tried: force fluids. The treatment provided significant relief.  Hematuria     Review of Systems  Genitourinary: Positive for hematuria.  Musculoskeletal: Positive for back pain.  All other systems reviewed and are negative.      Objective:   Physical Exam Vitals reviewed.  Constitutional:      General: She is not in acute distress.    Appearance: She is well-developed.  HENT:     Head: Normocephalic and atraumatic.     Right Ear: Tympanic membrane normal.     Left Ear: Tympanic membrane normal.  Eyes:     Pupils: Pupils are equal, round, and reactive to light.  Neck:     Thyroid: No thyromegaly.  Cardiovascular:     Rate and Rhythm: Normal rate and regular rhythm.     Heart sounds: Normal heart sounds. No murmur heard.   Pulmonary:     Effort: Pulmonary effort is normal. No respiratory distress.     Breath sounds: Normal breath sounds. No wheezing.  Abdominal:     General: Bowel sounds are normal. There is no distension.     Palpations: Abdomen is soft.     Tenderness: There is no abdominal tenderness.  Musculoskeletal:        General: No tenderness. Normal range of motion.     Cervical back: Normal range of motion and neck supple.     Comments: Negative CVA tenderness  Skin:    General: Skin is warm and dry.  Neurological:      Mental Status: She is alert and oriented to person, place, and time.     Cranial Nerves: No cranial nerve deficit.     Deep Tendon Reflexes: Reflexes are normal and symmetric.  Psychiatric:        Behavior: Behavior normal.        Thought Content: Thought content normal.        Judgment: Judgment normal.     BP 120/77   Pulse 82   Temp (!) 97.4 F (36.3 C) (Temporal)   Ht 5\' 5"  (1.651 m)   Wt 248 lb 3.2 oz (112.6 kg)   BMI 41.30 kg/m        Assessment & Plan:  Miranda Perkins comes in today with chief complaint of Back Pain (Started yesterday and the pain 10 out of 10 yesterday. Today is a little better 1 out of 10 day) and Hematuria (Started started yesterday none today)   Diagnosis and orders addressed:  1. Hematuria, unspecified type - Urinalysis, Complete  2. Flank pain   Probable kidney stone Told to force fluids Pain has resolved at this point, but if returns discussed to force fluids. If returns may need CT scan.  Strain given   [employer]  Miranda Perkins, [place]

## 2021-03-27 NOTE — Patient Instructions (Signed)
Kidney Stones  Kidney stones are solid, rock-like deposits that form inside of the kidneys. The kidneys are a pair of organs that make urine. A kidney stone may form in a kidney and move into other parts of the urinary tract, including the tubes that connect the kidneys to the bladder (ureters), the bladder, and the tube that carries urine out of the body (urethra). As the stone moves through these areas, it can cause intense pain and block the flow of urine. Kidney stones are created when high levels of certain minerals are found in the urine. The stones are usually passed out of the body through urination, but in some cases, medical treatment may be needed to remove them. What are the causes? Kidney stones may be caused by:  A condition in which certain glands produce too much parathyroid hormone (primary hyperparathyroidism), which causes too much calcium buildup in the blood.  A buildup of uric acid crystals in the bladder (hyperuricosuria). Uric acid is a chemical that the body produces when you eat certain foods. It usually exits the body in the urine.  Narrowing (stricture) of one or both of the ureters.  A kidney blockage that is present at birth (congenital obstruction).  Past surgery on the kidney or the ureters, such as gastric bypass surgery. What increases the risk? The following factors may make you more likely to develop this condition:  Having had a kidney stone in the past.  Having a family history of kidney stones.  Not drinking enough water.  Eating a diet that is high in protein, salt (sodium), or sugar.  Being overweight or obese. What are the signs or symptoms? Symptoms of a kidney stone may include:  Pain in the side of the abdomen, right below the ribs (flank pain). Pain usually spreads (radiates) to the groin.  Needing to urinate frequently or urgently.  Painful urination.  Blood in the urine (hematuria).  Nausea.  Vomiting.  Fever and chills. How  is this diagnosed? This condition may be diagnosed based on:  Your symptoms and medical history.  A physical exam.  Blood tests.  Urine tests. These may be done before and after the stone passes out of your body through urination.  Imaging tests, such as a CT scan, abdominal X-ray, or ultrasound.  A procedure to examine the inside of the bladder (cystoscopy). How is this treated? Treatment for kidney stones depends on the size, location, and makeup of the stones. Kidney stones will often pass out of the body through urination. You may need to:  Increase your fluid intake to help pass the stone. In some cases, you may be given fluids through an IV and may need to be monitored at the hospital.  Take medicine for pain.  Make changes in your diet to help prevent kidney stones from coming back. Sometimes, medical procedures are needed to remove a kidney stone. This may involve:  A procedure to break up kidney stones using: ? A focused beam of light (laser therapy). ? Shock waves (extracorporeal shock wave lithotripsy).  Surgery to remove kidney stones. This may be needed if you have severe pain or have stones that block your urinary tract. Follow these instructions at home: Medicines  Take over-the-counter and prescription medicines only as told by your health care provider.  Ask your health care provider if the medicine prescribed to you requires you to avoid driving or using heavy machinery. Eating and drinking  Drink enough fluid to keep your urine pale yellow.   You may be instructed to drink at least 8-10 glasses of water each day. This will help you pass the kidney stone.  If directed, change your diet. This may include: ? Limiting how much sodium you eat. ? Eating more fruits and vegetables. ? Limiting how much animal protein--such as red meat, poultry, fish, and eggs--you eat.  Follow instructions from your health care provider about eating or drinking  restrictions. General instructions  Collect urine samples as told by your health care provider. You may need to collect a urine sample: ? 24 hours after you pass the stone. ? 8-12 weeks after passing the kidney stone, and every 6-12 months after that.  Strain your urine every time you urinate, for as long as directed. Use the strainer that your health care provider recommends.  Do not throw out the kidney stone after passing it. Keep the stone so it can be tested by your health care provider. Testing the makeup of your kidney stone may help prevent you from getting kidney stones in the future.  Keep all follow-up visits as told by your health care provider. This is important. You may need follow-up X-rays or ultrasounds to make sure that your stone has passed. How is this prevented? To prevent another kidney stone:  Drink enough fluid to keep your urine pale yellow. This is the best way to prevent kidney stones.  Eat a healthy diet and follow recommendations from your health care provider about foods to avoid. You may be instructed to eat a low-protein diet. Recommendations vary depending on the type of kidney stone that you have.  Maintain a healthy weight.   Where to find more information  National Kidney Foundation (NKF): www.kidney.org  [employer] (UCF): www.urologyhealth.org Contact a health care provider if:  You have pain that gets worse or does not get better with medicine. Get help right away if:  You have a fever or chills.  You develop severe pain.  You develop new abdominal pain.  You faint.  You are unable to urinate. Summary  Kidney stones are solid, rock-like deposits that form inside of the kidneys.  Kidney stones can cause nausea, vomiting, blood in the urine, abdominal pain, and the urge to urinate frequently.  Treatment for kidney stones depends on the size, location, and makeup of the stones. Kidney stones will often pass out of the body  through urination.  Kidney stones can be prevented by drinking enough fluids, eating a healthy diet, and maintaining a healthy weight. This information is not intended to replace advice given to you by your health care provider. Make sure you discuss any questions you have with your health care provider. Document Revised: 03/07/2019 Document Reviewed: 03/07/2019 Elsevier Patient Education  2021 [employer]  

## 2021-03-28 ENCOUNTER — Other Ambulatory Visit (HOSPITAL_COMMUNITY)
Admission: RE | Admit: 2021-03-28 | Discharge: 2021-03-28 | Disposition: A | Discharge status: Home / Self Care | Source: Ambulatory Visit | Attending: Obstetrics & Gynecology | Admitting: Obstetrics & Gynecology

## 2021-03-28 ENCOUNTER — Encounter: Payer: Self-pay | Admitting: Obstetrics & Gynecology

## 2021-03-28 ENCOUNTER — Ambulatory Visit (INDEPENDENT_AMBULATORY_CARE_PROVIDER_SITE_OTHER): Admitting: Obstetrics & Gynecology

## 2021-03-28 VITALS — BP 128/80

## 2021-03-28 DIAGNOSIS — N950 Postmenopausal bleeding: Secondary | ICD-10-CM

## 2021-03-28 NOTE — Progress Notes (Signed)
    Miranda Perkins 1961/02/13 [id number]        60 y.o.  [id number]   RP: PMB x 3 days  HPI:  Postmenopause on no HRT.  Light spotting 3 days ago.  Had lower back pain and lower abdominal cramps at that time and thought she was passing a kidney stone.  U/A yesterday showed trace blood.  Today had heavier red bleeding which then became brown bleeding from the vagina.  Had more severe lower abdominal cramps in the midline.  No UTI Sx.  No fever.   OB History  Gravida Para Term Preterm AB Living  4 3     1 2   SAB IAB Ectopic Multiple Live Births  1            # Outcome Date GA Lbr Len/2nd Weight Sex Delivery Anes PTL Lv  4 SAB           3 Para           2 Para           1 Para             Past medical history,surgical history, problem list, medications, allergies, family history and social history were all reviewed and documented in the EPIC chart.   Directed ROS with pertinent positives and negatives documented in the history of present illness/assessment and plan.  Exam:  Vitals:   03/28/21 1520  BP: 128/80   General appearance:  Normal  Abdomen: Normal  Gynecologic exam: Vulva normal.  Speculum:  Brown blood coming from the endocervical canal.  Cervix/vagina normal.  Pap reflex done.  Bimanual exam:  Uterus AV, normal volume, mobile, NT.  No adnexal mass, NT.  U/A 03/27/2021:   Assessment/Plan:  60 y.o. [id number]   1. Postmenopausal bleeding Postmenopausal bleeding on no hormone replacement therapy.  Normal gynecologic exam today.  Pap test overdue, done today.  We will follow-up with a pelvic ultrasound to evaluate the endometrial lining and rule out endometrial pathology such as polyps, submucosal fibroids, endometrial hyperplasia and endometrial cancer.  Patient voiced understanding and agreement with plan. - US Transvaginal Non-OB; [facility]  Princess Bruins MD, 3:41 PM 03/28/2021

## 2021-03-31 ENCOUNTER — Encounter: Payer: Self-pay | Admitting: Obstetrics & Gynecology

## 2021-04-02 LAB — CYTOLOGY - PAP: Diagnosis: NEGATIVE

## 2021-04-10 ENCOUNTER — Ambulatory Visit (INDEPENDENT_AMBULATORY_CARE_PROVIDER_SITE_OTHER): Admitting: Obstetrics & Gynecology

## 2021-04-10 ENCOUNTER — Other Ambulatory Visit: Payer: Self-pay

## 2021-04-10 ENCOUNTER — Ambulatory Visit (INDEPENDENT_AMBULATORY_CARE_PROVIDER_SITE_OTHER)

## 2021-04-10 ENCOUNTER — Encounter: Payer: Self-pay | Admitting: Obstetrics & Gynecology

## 2021-04-10 VITALS — BP 124/80

## 2021-04-10 DIAGNOSIS — N950 Postmenopausal bleeding: Secondary | ICD-10-CM

## 2021-04-10 DIAGNOSIS — R9389 Abnormal findings on diagnostic imaging of other specified body structures: Secondary | ICD-10-CM | POA: Diagnosis not present

## 2021-04-10 DIAGNOSIS — N854 Malposition of uterus: Secondary | ICD-10-CM

## 2021-04-10 NOTE — Progress Notes (Signed)
    CARL BLEECKER 04-08-1961 [id number]        60 y.o.  [id number]   RP: PMB for Pelvic US  HPI: Postmenopause on no HRT.  Light spotting 3 days in 5/2022v.  Had lower back pain and lower abdominal cramps at that time and thought she was passing a kidney stone.  U/A yesterday showed trace blood.  Today had heavier red bleeding which then became brown bleeding from the vagina.  Had more severe lower abdominal cramps in the midline.  No UTI Sx.  No fever.   OB History  Gravida Para Term Preterm AB Living  4 3     1 2   SAB IAB Ectopic Multiple Live Births  1            # Outcome Date GA Lbr Len/2nd Weight Sex Delivery Anes PTL Lv  4 SAB           3 Para           2 Para           1 Para             Past medical history,surgical history, problem list, medications, allergies, family history and social history were all reviewed and documented in the EPIC chart.   Directed ROS with pertinent positives and negatives documented in the history of present illness/assessment and plan.  Exam:  Vitals:   04/10/21 0846  BP: 124/80   General appearance:  Normal  Pelvic US today: T/V images.  Anteverted uterus normal in size and shape with no myometrial mass.  Cystic spaces present within the myometrium which are avascular.  The uterus is measured at 10.19 x 5.26 x 4.71 cm.  Thickened endometrium with cystic spaces and echogenic focus, cannot rule out an endometrial mass measured at 1.2 cm.  The endometrial thickness is measured at 6.67 mm.  Bilateral ovaries not seen transvaginally or transabdominally, but bilateral adnexa appear negative.  Unable to use deep palpation due to patient's intolerance to pain.  No free fluid in the posterior cul-de-sac.   Assessment/Plan:  60 y.o. [id number]   1. Postmenopausal bleeding Pelvic ultrasound findings thoroughly reviewed with patient.  Endometrial lining is thickened at 6.67 mm.  Probable endometrial mass measured at 1.2 cm compatible with an  endometrial polyp.  Uterus otherwise normal.  Ovaries not visualized, but no adnexal mass seen.  No free fluid in the pelvic cavity.  Decision to proceed with a sonohysterogram/possible endometrial biopsy, which patient will schedule today.  Sonohysterogram procedure explained to patient.  2. Thickened endometrium Thickened endometrium with probable endometrial polyp.  We will further investigate at follow-up with a sonohysterogram/possible endometrial biopsy. - [place] Sonohysterogram; Future   Princess Bruins MD, 9:01 AM 04/10/2021

## 2021-04-21 ENCOUNTER — Other Ambulatory Visit: Payer: Self-pay | Admitting: *Deleted

## 2021-04-21 DIAGNOSIS — N950 Postmenopausal bleeding: Secondary | ICD-10-CM

## 2021-04-21 DIAGNOSIS — R9389 Abnormal findings on diagnostic imaging of other specified body structures: Secondary | ICD-10-CM

## 2021-04-29 ENCOUNTER — Ambulatory Visit (INDEPENDENT_AMBULATORY_CARE_PROVIDER_SITE_OTHER)

## 2021-04-29 ENCOUNTER — Other Ambulatory Visit: Payer: Self-pay

## 2021-04-29 ENCOUNTER — Ambulatory Visit (INDEPENDENT_AMBULATORY_CARE_PROVIDER_SITE_OTHER): Admitting: Obstetrics & Gynecology

## 2021-04-29 ENCOUNTER — Encounter: Payer: Self-pay | Admitting: Obstetrics & Gynecology

## 2021-04-29 DIAGNOSIS — R9389 Abnormal findings on diagnostic imaging of other specified body structures: Secondary | ICD-10-CM | POA: Diagnosis not present

## 2021-04-29 DIAGNOSIS — N950 Postmenopausal bleeding: Secondary | ICD-10-CM | POA: Diagnosis not present

## 2021-04-29 DIAGNOSIS — N840 Polyp of corpus uteri: Secondary | ICD-10-CM

## 2021-04-29 NOTE — Progress Notes (Signed)
Miranda Perkins [birth year] [id number]        60 y.o.  [id number]   RP: PMB/thickened endometrium for Sonohysterogram  HPI: Patient seen on 04/10/2021:   Postmenopause on no HRT.  Light spotting 3 days in 03/2021.  Had lower back pain and lower abdominal cramps at that time and thought she was passing a kidney stone.  U/A yesterday showed trace blood.  Today had heavier red bleeding which then became brown bleeding from the vagina.  Had more severe lower abdominal cramps in the midline.  No UTI Sx.  No fever.   OB History  Gravida Para Term Preterm AB Living  4 3     1 2   SAB IAB Ectopic Multiple Live Births  1            # Outcome Date GA Lbr Len/2nd Weight Sex Delivery Anes PTL Lv  4 SAB           3 Para           2 Para           1 Para             Past medical history,surgical history, problem list, medications, allergies, family history and social history were all reviewed and documented in the EPIC chart.   Directed ROS with pertinent positives and negatives documented in the history of present illness/assessment and plan.  Exam:  There were no vitals filed for this visit. General appearance:  Normal                                                                    Sono Infusion Hysterogram ( procedure note)   The initial transvaginal ultrasound demonstrated the following: T/V images.  Anteverted uterus normal in size and shape with no myometrial mass.  Both ovaries appear normal.  No adnexal mass.  No free fluid in the pelvis.  The speculum  was inserted and the cervix cleansed with Betadine solution after confirming that patient has no allergies.A small sonohysterography catheterwas utilized.  Insertion was facilitated with ring forceps, using a spear-like motion the catheter was inserted to the fundus of the uterus. The speculum is then removed carefully to avoid dislodging the catheter. The catheter was flushed with sterile saline delete prior to insertion to rid it  of small amounts of air.the sterile saline solution was infused into the uterine cavity as a vaginal ultrasound probe was then placed in the vagina for full visualization of the uterine cavity from a transvaginal approach. The following was noted: After saline infusion, then sono hysterogram shows a thickened endometrium with at least 3 filling defects seen.  Compatible with endometrial polyps.  The catheter was then removed after retrieving some of the saline from the intrauterine cavity. An endometrial biopsy was not done. Patient tolerated procedure well. She had received a tablet of Aleve for discomfort.     Assessment/Plan:  60 y.o. [id number]   1. Postmenopausal bleeding Sonohysterogram done today for postmenopausal bleeding with thickened endometrium on ultrasound.  The endometrial thickness was measured at 12.33 mm today.  The sonohysterogram showed a thickened endometrium with at least 3 filling defects seen.  Compatible with endometrial polyps.  Decision to proceed with hysteroscopy with  MyoSure excisions and D&C.  Information about surgery reviewed and pamphlet on hysteroscopy given to patient.  We will follow-up for preop visit.  2. Thickened endometrium As above.  3. Endometrial polyp   Princess Bruins MD, 5:06 PM 04/29/2021

## 2021-04-30 ENCOUNTER — Telehealth: Payer: Self-pay | Admitting: *Deleted

## 2021-04-30 NOTE — Telephone Encounter (Signed)
-----   Message from Ramond Craver, [place] sent at 04/30/2021  8:48 AM EDT ----- Regarding: FW: Schedule surgery  ----- Message ----- From: Princess Bruins, MD Sent: 04/29/2021   5:05 PM EDT To: Ramond Craver, RMA Subject: Schedule surgery                               Surgery: Hysteroscopy, Myosure Excision, Dilation and Curettage  Diagnosis: Endometrial Polyps  Location: [place]  Status: Outpatient  Time: 30 Minutes  Assistant: N/A  Urgency: First Available  Pre-Op Appointment: To Be Scheduled  Post-Op Appointment(s): 2 Weeks  Time Out Of Work: Day Of Surgery, 1 Day Post Op

## 2021-04-30 NOTE — Telephone Encounter (Signed)
Spoke with patient.  Reviewed surgery dates.  Patient request to proceed with surgery on 06/03/21. Patient declines earlier surgery time. Advised patient I will f/u once date confirmed. Patient agreeable.   Surgery request sent.   Routing to [employer].

## 2021-05-01 ENCOUNTER — Ambulatory Visit: Admitting: Internal Medicine

## 2021-05-02 ENCOUNTER — Encounter: Payer: Self-pay | Admitting: Obstetrics & Gynecology

## 2021-05-07 NOTE — Telephone Encounter (Signed)
Left message to call Envy Meno, RN at [facility], [phone].  

## 2021-05-07 NOTE — Telephone Encounter (Signed)
Spoke with patient regarding surgery benefits. Patient acknowledges understanding of information presented. Patient is aware that benefits presented are professional benefits only. Patient is aware that once surgery is scheduled, the hospital will call with separate benefits. See account note.  Routing to Jill Hamm, RN, for surgery scheduling. 

## 2021-05-12 ENCOUNTER — Other Ambulatory Visit: Payer: Self-pay | Admitting: Family Medicine

## 2021-05-12 DIAGNOSIS — R7989 Other specified abnormal findings of blood chemistry: Secondary | ICD-10-CM

## 2021-05-12 DIAGNOSIS — L680 Hirsutism: Secondary | ICD-10-CM

## 2021-05-12 DIAGNOSIS — M7989 Other specified soft tissue disorders: Secondary | ICD-10-CM

## 2021-05-12 DIAGNOSIS — L719 Rosacea, unspecified: Secondary | ICD-10-CM

## 2021-05-12 NOTE — Telephone Encounter (Signed)
Spoke with patient. Surgery date request confirmed.  Advised surgery is scheduled for 06/03/21, [facility] at 12pm.  Surgery instruction sheet and hospital brochure reviewed, printed copy will be mailed. Patient advised of Covid screening and quarantine requirements and agreeable.  Pre-op scheduled for 05/15/21.  Routing to provider. Encounter closed.

## 2021-05-15 ENCOUNTER — Encounter: Payer: Self-pay | Admitting: Obstetrics & Gynecology

## 2021-05-15 ENCOUNTER — Other Ambulatory Visit: Payer: Self-pay

## 2021-05-15 ENCOUNTER — Ambulatory Visit (INDEPENDENT_AMBULATORY_CARE_PROVIDER_SITE_OTHER): Admitting: Obstetrics & Gynecology

## 2021-05-15 VITALS — BP 118/80 | Resp 16

## 2021-05-15 DIAGNOSIS — N840 Polyp of corpus uteri: Secondary | ICD-10-CM | POA: Diagnosis not present

## 2021-05-15 DIAGNOSIS — N950 Postmenopausal bleeding: Secondary | ICD-10-CM

## 2021-05-15 NOTE — Progress Notes (Signed)
Miranda Perkins May 04, 1961 [id number]        60 y.o.  [id number]   RP: Preop HSC/D+C/Myosure Excision for PMB/Endometrial Polyp  HPI: PMB/Endometrial Polyp per Sonohysto.   Postmenopause on no HRT.  Light spotting 3 days in 03/2021.  Had lower back pain and lower abdominal cramps at that time and thought she was passing a kidney stone.  U/A showed trace blood at that time. Then, heavier red bleeding which became brown bleeding from the vagina.  Had more severe lower abdominal cramps in the midline.  No UTI Sx.  No fever.     OB History  Gravida Para Term Preterm AB Living  4 3     1 2   SAB IAB Ectopic Multiple Live Births  1       3    # Outcome Date GA Lbr Len/2nd Weight Sex Delivery Anes PTL Lv  4 SAB           3 Para           2 Para           1 Para             Past medical history,surgical history, problem list, medications, allergies, family history and social history were all reviewed and documented in the EPIC chart.   Directed ROS with pertinent positives and negatives documented in the history of present illness/assessment and plan.  Exam:  Vitals:   05/15/21 1507  BP: 118/80  Resp: 16   General appearance:  Normal                                                                     Sono Infusion Hysterogram ( procedure note)     The initial transvaginal ultrasound demonstrated the following: T/V images.  Anteverted uterus normal in size and shape with no myometrial mass.  Both ovaries appear normal.  No adnexal mass.  No free fluid in the pelvis.   The speculum  was inserted and the cervix cleansed with Betadine solution after confirming that patient has no allergies.A small sonohysterography catheterwas utilized.  Insertion was facilitated with ring forceps, using a spear-like motion the catheter was inserted to the fundus of the uterus. The speculum is then removed carefully to avoid dislodging the catheter. The catheter was flushed with sterile saline  delete prior to insertion to rid it of small amounts of air.the sterile saline solution was infused into the uterine cavity as a vaginal ultrasound probe was then placed in the vagina for full visualization of the uterine cavity from a transvaginal approach. The following was noted: After saline infusion, then sono hysterogram shows a thickened endometrium with at least 3 filling defects seen.  Compatible with endometrial polyps.   The catheter was then removed after retrieving some of the saline from the intrauterine cavity. An endometrial biopsy was not done. Patient tolerated procedure well. She had received a tablet of Aleve for discomfort.       Assessment/Plan:  60 y.o. [id number]    1. Postmenopausal bleeding Sonohysterogram done today for postmenopausal bleeding with thickened endometrium on ultrasound.  The endometrial thickness was measured at 12.33 mm today.  The sonohysterogram showed a thickened endometrium with at least 3  filling defects seen.  Compatible with endometrial polyps.  Decision to proceed with hysteroscopy with MyoSure excisions and D&C.  Information about surgery reviewed thoroughly and pamphlet on hysteroscopy given to patient.     2. Endometrial polyp  As above.                        Patient was counseled as to the risk of surgery to include the following:  1. Infection (prohylactic antibiotics will be administered)  2. DVT/Pulmonary Embolism (prophylactic pneumo compression stockings will be used)  3.Trauma to internal organs requiring additional surgical procedure to repair any injury to internal organs requiring perhaps additional hospitalization days.  4.Hemmorhage requiring transfusion and blood products which carry risks such as anaphylactic reaction, hepatitis and AIDS  Patient had received literature information on the procedure scheduled and all her questions were answered and fully accepts all risk.    Assessment/Plan:  60 y.o. [id number]   1.  Postmenopausal bleeding Preop HSC/D+C/Myosure Excision.  2. Endometrial polyp Preop HSC/D+C/Myosure Excision.  Other orders - furosemide (LASIX) 40 MG tablet; Take by mouth. - DOXYCYCLINE PO; Take by mouth. As needed for rosacea (Patient not taking: Reported on 05/15/2021)                         Patient was counseled as to the risk of surgery to include the following:  1. Infection (prohylactic antibiotics will be administered)  2. DVT/Pulmonary Embolism (prophylactic pneumo compression stockings will be used)  3.Trauma to internal organs requiring additional surgical procedure to repair any injury to internal organs requiring perhaps additional hospitalization days.  4.Hemmorhage requiring transfusion and blood products which carry risks such as anaphylactic reaction, hepatitis and AIDS  Patient had received literature information on the procedure scheduled and all her questions were answered and fully accepts all risk.  Princess Bruins MD, 3:27 PM 05/15/2021

## 2021-05-18 ENCOUNTER — Encounter: Payer: Self-pay | Admitting: Obstetrics & Gynecology

## 2021-05-28 ENCOUNTER — Encounter (HOSPITAL_BASED_OUTPATIENT_CLINIC_OR_DEPARTMENT_OTHER): Payer: Self-pay | Admitting: Obstetrics & Gynecology

## 2021-05-28 ENCOUNTER — Other Ambulatory Visit: Payer: Self-pay

## 2021-05-28 NOTE — Progress Notes (Signed)
Spoke w/ via phone for pre-op interview--- Pt Lab needs dos----   Istat, CBC            Lab results------ current ekg in epic/ chart COVID test -----patient states asymptomatic no test needed  Arrive at ------- 0945 on 06-03-2021 NPO after MN NO Solid Food.  Clear liquids from MN until--- 0845 Med rec completed Medications to take morning of surgery ----- NONE Diabetic medication ----- n/a  Patient instructed no nail polish to be worn day of surgery Patient instructed to bring photo id and insurance card day of surgery Patient aware to have Driver (ride ) / caregiver   for 24 hours after surgery --husband, Miranda Perkins Patient Special Instructions ----- asked to bring rescue inhaler dos  Pre-Op special Istructions ----- n/a Patient verbalized understanding of instructions that were given at this phone interview. Patient denies shortness of breath, chest pain, fever, cough at this phone interview.    Anesthesia :  hx covid 02/ 2022 w/ improving residual sob occasional  and no symptoms of palpitations since 03/ 2022 (pt had negative cardiology work-up). peripheral swelling lower extremity, taking lasix  PCP:  Dr Lajuana Ripple [facility] 05-26-2022epic) Cardiologist : DrC. Branch Cassell Clement 01-30-2021 epic) Chest x-ray : 03-20-2021 epic EKG : 12-19-2020 epic Echo : 01-24-2021 epic Stress test: 01-24-2021 epic Event monitor:  01-14-2021 epic Cardiac Cath : no

## 2021-06-03 ENCOUNTER — Ambulatory Visit (HOSPITAL_BASED_OUTPATIENT_CLINIC_OR_DEPARTMENT_OTHER)
Admission: RE | Admit: 2021-06-03 | Discharge: 2021-06-03 | Disposition: A | Discharge status: Home / Self Care | Attending: Obstetrics & Gynecology | Admitting: Obstetrics & Gynecology

## 2021-06-03 ENCOUNTER — Ambulatory Visit (HOSPITAL_BASED_OUTPATIENT_CLINIC_OR_DEPARTMENT_OTHER): Admitting: Anesthesiology

## 2021-06-03 ENCOUNTER — Encounter (HOSPITAL_BASED_OUTPATIENT_CLINIC_OR_DEPARTMENT_OTHER): Payer: Self-pay | Admitting: Obstetrics & Gynecology

## 2021-06-03 ENCOUNTER — Encounter (HOSPITAL_BASED_OUTPATIENT_CLINIC_OR_DEPARTMENT_OTHER)
Admission: RE | Disposition: A | Discharge status: Home / Self Care | Payer: Self-pay | Source: Home / Self Care | Attending: Obstetrics & Gynecology

## 2021-06-03 DIAGNOSIS — Z888 Allergy status to other drugs, medicaments and biological substances status: Secondary | ICD-10-CM | POA: Diagnosis not present

## 2021-06-03 DIAGNOSIS — Z85818 Personal history of malignant neoplasm of other sites of lip, oral cavity, and pharynx: Secondary | ICD-10-CM | POA: Insufficient documentation

## 2021-06-03 DIAGNOSIS — Z880 Allergy status to penicillin: Secondary | ICD-10-CM | POA: Diagnosis not present

## 2021-06-03 DIAGNOSIS — Z91048 Other nonmedicinal substance allergy status: Secondary | ICD-10-CM | POA: Diagnosis not present

## 2021-06-03 DIAGNOSIS — Z8673 Personal history of transient ischemic attack (TIA), and cerebral infarction without residual deficits: Secondary | ICD-10-CM | POA: Diagnosis not present

## 2021-06-03 DIAGNOSIS — N950 Postmenopausal bleeding: Secondary | ICD-10-CM | POA: Diagnosis not present

## 2021-06-03 DIAGNOSIS — Z87891 Personal history of nicotine dependence: Secondary | ICD-10-CM | POA: Insufficient documentation

## 2021-06-03 DIAGNOSIS — R9389 Abnormal findings on diagnostic imaging of other specified body structures: Secondary | ICD-10-CM | POA: Diagnosis not present

## 2021-06-03 DIAGNOSIS — N840 Polyp of corpus uteri: Secondary | ICD-10-CM

## 2021-06-03 DIAGNOSIS — Z791 Long term (current) use of non-steroidal anti-inflammatories (NSAID): Secondary | ICD-10-CM | POA: Insufficient documentation

## 2021-06-03 DIAGNOSIS — Z8616 Personal history of COVID-19: Secondary | ICD-10-CM | POA: Diagnosis not present

## 2021-06-03 DIAGNOSIS — Z96652 Presence of left artificial knee joint: Secondary | ICD-10-CM | POA: Insufficient documentation

## 2021-06-03 DIAGNOSIS — Z85828 Personal history of other malignant neoplasm of skin: Secondary | ICD-10-CM | POA: Diagnosis not present

## 2021-06-03 DIAGNOSIS — Z887 Allergy status to serum and vaccine status: Secondary | ICD-10-CM | POA: Diagnosis not present

## 2021-06-03 DIAGNOSIS — Z79899 Other long term (current) drug therapy: Secondary | ICD-10-CM | POA: Diagnosis not present

## 2021-06-03 HISTORY — DX: Hirsutism: L68.0

## 2021-06-03 HISTORY — DX: Post covid-19 condition, unspecified: U09.9

## 2021-06-03 HISTORY — PX: DILATATION & CURETTAGE/HYSTEROSCOPY WITH MYOSURE: SHX6511

## 2021-06-03 HISTORY — DX: Chronic obstructive pulmonary disease, unspecified: J44.9

## 2021-06-03 HISTORY — DX: Localized edema: R60.0

## 2021-06-03 HISTORY — DX: Polyp of corpus uteri: N84.0

## 2021-06-03 HISTORY — DX: Rosacea, unspecified: L71.9

## 2021-06-03 HISTORY — DX: Personal history of other specified conditions: Z87.898

## 2021-06-03 HISTORY — DX: Postmenopausal bleeding: N95.0

## 2021-06-03 HISTORY — DX: Abnormal findings on diagnostic imaging of other specified body structures: R93.89

## 2021-06-03 HISTORY — DX: Other forms of dyspnea: R06.09

## 2021-06-03 LAB — CBC
HCT: 44.2 % (ref 36.0–46.0)
Hemoglobin: 14.6 g/dL (ref 12.0–15.0)
MCH: 29.5 pg (ref 26.0–34.0)
MCHC: 33 g/dL (ref 30.0–36.0)
MCV: 89.3 fL (ref 80.0–100.0)
Platelets: 278 10*3/uL (ref 150–400)
RBC: 4.95 MIL/uL (ref 3.87–5.11)
RDW: 13 % (ref 11.5–15.5)
WBC: 6.5 10*3/uL (ref 4.0–10.5)
nRBC: 0 % (ref 0.0–0.2)

## 2021-06-03 SURGERY — DILATATION & CURETTAGE/HYSTEROSCOPY WITH MYOSURE
Anesthesia: General | Site: Vagina

## 2021-06-03 MED ORDER — CLINDAMYCIN PHOSPHATE 900 MG/50ML IV SOLN
INTRAVENOUS | Status: AC
  Filled 2021-06-03: qty 50

## 2021-06-03 MED ORDER — ONDANSETRON HCL 4 MG/2ML IJ SOLN
INTRAMUSCULAR | Status: AC
  Filled 2021-06-03: qty 2

## 2021-06-03 MED ORDER — FENTANYL CITRATE (PF) 100 MCG/2ML IJ SOLN
INTRAMUSCULAR | Status: DC | PRN
  Administered 2021-06-03: 25 ug via INTRAVENOUS
  Administered 2021-06-03: 50 ug via INTRAVENOUS
  Administered 2021-06-03: 25 ug via INTRAVENOUS

## 2021-06-03 MED ORDER — AMISULPRIDE (ANTIEMETIC) 5 MG/2ML IV SOLN: 10.0000 mg | Freq: Once | INTRAVENOUS | Status: DC | PRN

## 2021-06-03 MED ORDER — ACETAMINOPHEN 500 MG PO TABS
ORAL_TABLET | ORAL | Status: AC
  Filled 2021-06-03: qty 2

## 2021-06-03 MED ORDER — MIDAZOLAM HCL 2 MG/2ML IJ SOLN
INTRAMUSCULAR | Status: AC
  Filled 2021-06-03: qty 2

## 2021-06-03 MED ORDER — ACETAMINOPHEN 500 MG PO TABS
1000.0000 mg | ORAL_TABLET | Freq: Once | ORAL | Status: AC
  Administered 2021-06-03: 1000 mg via ORAL

## 2021-06-03 MED ORDER — DEXAMETHASONE SODIUM PHOSPHATE 10 MG/ML IJ SOLN
INTRAMUSCULAR | Status: AC
  Filled 2021-06-03: qty 1

## 2021-06-03 MED ORDER — SCOPOLAMINE 1 MG/3DAYS TD PT72
1.0000 | MEDICATED_PATCH | TRANSDERMAL | Status: DC
  Administered 2021-06-03: 1.5 mg via TRANSDERMAL

## 2021-06-03 MED ORDER — LIDOCAINE HCL (PF) 2 % IJ SOLN
INTRAMUSCULAR | Status: AC
  Filled 2021-06-03: qty 5

## 2021-06-03 MED ORDER — CLINDAMYCIN PHOSPHATE 900 MG/50ML IV SOLN
900.0000 mg | INTRAVENOUS | Status: AC
  Administered 2021-06-03: 900 mg via INTRAVENOUS

## 2021-06-03 MED ORDER — GENTAMICIN SULFATE 40 MG/ML IJ SOLN
5.0000 mg/kg | INTRAMUSCULAR | Status: AC
  Administered 2021-06-03: 440 mg via INTRAVENOUS
  Filled 2021-06-03: qty 10

## 2021-06-03 MED ORDER — PROPOFOL 10 MG/ML IV BOLUS
INTRAVENOUS | Status: DC | PRN
  Administered 2021-06-03: 160 mg via INTRAVENOUS

## 2021-06-03 MED ORDER — SODIUM CHLORIDE 0.9 % IR SOLN
Status: DC | PRN
  Administered 2021-06-03: 3000 mL

## 2021-06-03 MED ORDER — ONDANSETRON HCL 4 MG/2ML IJ SOLN
INTRAMUSCULAR | Status: DC | PRN
  Administered 2021-06-03: 4 mg via INTRAVENOUS

## 2021-06-03 MED ORDER — LACTATED RINGERS IV SOLN: INTRAVENOUS | Status: DC

## 2021-06-03 MED ORDER — DEXAMETHASONE SODIUM PHOSPHATE 10 MG/ML IJ SOLN
INTRAMUSCULAR | Status: DC | PRN
  Administered 2021-06-03: 10 mg via INTRAVENOUS

## 2021-06-03 MED ORDER — MIDAZOLAM HCL 5 MG/5ML IJ SOLN
INTRAMUSCULAR | Status: DC | PRN
  Administered 2021-06-03: 2 mg via INTRAVENOUS

## 2021-06-03 MED ORDER — SCOPOLAMINE 1 MG/3DAYS TD PT72
MEDICATED_PATCH | TRANSDERMAL | Status: AC
  Filled 2021-06-03: qty 1

## 2021-06-03 MED ORDER — LIDOCAINE HCL 1 % IJ SOLN
INTRAMUSCULAR | Status: AC
  Filled 2021-06-03: qty 20

## 2021-06-03 MED ORDER — POVIDONE-IODINE 10 % EX SWAB: 2.0000 "application " | Freq: Once | CUTANEOUS | Status: DC

## 2021-06-03 MED ORDER — OXYCODONE HCL 5 MG/5ML PO SOLN
5.0000 mg | Freq: Once | ORAL | Status: DC | PRN
Start: 2021-06-03 — End: 2021-06-03

## 2021-06-03 MED ORDER — LIDOCAINE HCL 1 % IJ SOLN
INTRAMUSCULAR | Status: DC | PRN
  Administered 2021-06-03: 20 mL

## 2021-06-03 MED ORDER — LIDOCAINE 2% (20 MG/ML) 5 ML SYRINGE
INTRAMUSCULAR | Status: DC | PRN
  Administered 2021-06-03: 80 mg via INTRAVENOUS

## 2021-06-03 MED ORDER — OXYCODONE HCL 5 MG PO TABS: 5.0000 mg | ORAL_TABLET | Freq: Once | ORAL | Status: DC | PRN

## 2021-06-03 MED ORDER — FENTANYL CITRATE (PF) 100 MCG/2ML IJ SOLN: 25.0000 ug | INTRAMUSCULAR | Status: DC | PRN

## 2021-06-03 MED ORDER — FENTANYL CITRATE (PF) 100 MCG/2ML IJ SOLN
INTRAMUSCULAR | Status: AC
  Filled 2021-06-03: qty 2

## 2021-06-03 MED ORDER — PROPOFOL 10 MG/ML IV BOLUS
INTRAVENOUS | Status: AC
  Filled 2021-06-03: qty 20

## 2021-06-03 MED ORDER — PROMETHAZINE HCL 25 MG/ML IJ SOLN: 6.2500 mg | INTRAMUSCULAR | Status: DC | PRN

## 2021-06-03 SURGICAL SUPPLY — 16 items
CATH ROBINSON RED A/P 16FR (CATHETERS) ×2 IMPLANT
DEVICE MYOSURE REACH (MISCELLANEOUS) ×2 IMPLANT
GAUZE 4X4 16PLY ~~LOC~~+RFID DBL (SPONGE) ×4 IMPLANT
GLOVE SURG ENC MOIS LTX SZ6.5 (GLOVE) ×2 IMPLANT
GLOVE SURG POLYISO LF SZ6.5 (GLOVE) ×2 IMPLANT
GLOVE SURG UNDER POLY LF SZ6.5 (GLOVE) ×2 IMPLANT
GLOVE SURG UNDER POLY LF SZ7 (GLOVE) ×4 IMPLANT
GOWN STRL REUS W/ TWL LRG LVL3 (GOWN DISPOSABLE) ×1 IMPLANT
GOWN STRL REUS W/TWL LRG LVL3 (GOWN DISPOSABLE) ×6 IMPLANT
IV NS IRRIG 3000ML ARTHROMATIC (IV SOLUTION) ×2 IMPLANT
KIT PROCEDURE FLUENT (KITS) ×2 IMPLANT
KIT TURNOVER CYSTO (KITS) ×2 IMPLANT
PACK VAGINAL MINOR WOMEN LF (CUSTOM PROCEDURE TRAY) ×2 IMPLANT
PAD OB MATERNITY 4.3X12.25 (PERSONAL CARE ITEMS) ×2 IMPLANT
PAD PREP 24X48 CUFFED NSTRL (MISCELLANEOUS) ×2 IMPLANT
SEAL ROD LENS SCOPE MYOSURE (ABLATOR) ×2 IMPLANT

## 2021-06-03 NOTE — Anesthesia Procedure Notes (Signed)
Procedure Name: LMA Insertion Date/Time: 06/03/2021 12:35 PM Performed by: Delaney Schnick D, CRNA Pre-anesthesia Checklist: Patient identified, Emergency Drugs available, Suction available and Patient being monitored Patient Re-evaluated:Patient Re-evaluated prior to induction Oxygen Delivery Method: Circle system utilized Preoxygenation: Pre-oxygenation with 100% oxygen Induction Type: IV induction Ventilation: Mask ventilation without difficulty LMA: LMA inserted LMA Size: 4.0 Tube type: Oral Number of attempts: 1 Placement Confirmation: positive ETCO2 and breath sounds checked- equal and bilateral Tube secured with: Tape Dental Injury: Teeth and Oropharynx as per pre-operative assessment

## 2021-06-03 NOTE — Anesthesia Postprocedure Evaluation (Signed)
Anesthesia Post Note  Patient: Miranda Perkins  Procedure(s) Performed: DILATATION & CURETTAGE/HYSTEROSCOPY WITH MYOSURE (Vagina )     Patient location during evaluation: PACU Anesthesia Type: General Level of consciousness: awake Pain management: pain level controlled Vital Signs Assessment: post-procedure vital signs reviewed and stable Respiratory status: spontaneous breathing and respiratory function stable Cardiovascular status: stable Postop Assessment: no apparent nausea or vomiting Anesthetic complications: no   No notable events documented.  Last Vitals:  Vitals:   06/03/21 1314 06/03/21 1315  BP:  (!) 146/84  Pulse:  73  Resp: (!) 3 17  Temp:    SpO2:  98%    Last Pain:  Vitals:   06/03/21 1315  TempSrc:   PainSc: 0-No pain                 [name]

## 2021-06-03 NOTE — H&P (Signed)
Miranda Perkins is an 60 y.o. female.[id number]    RP: HSC/D+C/Myosure Excision for PMB/Endometrial Polyp   HPI: PMB/Endometrial Polyp per Sonohysto.   Postmenopause on no HRT.  Light spotting 3 days in 03/2021.  Had lower back pain and lower abdominal cramps at that time and thought she was passing a kidney stone.  U/A showed trace blood at that time. Then, heavier red bleeding which became brown bleeding from the vagina.  Had more severe lower abdominal cramps in the midline.  No UTI Sx.  No fever.    Pertinent Gynecological History: Menses: post-menopausal Contraception: post menopausal status Blood transfusions: none Sexually transmitted diseases: no past history Last mammogram: normal  Last pap: normal     Menstrual History:  No LMP recorded. Patient is postmenopausal.    Past Medical History:  Diagnosis Date   Arthritis    COPD (chronic obstructive pulmonary disease) ([place])    Edema of both lower extremities    Endometrial polyp    Hirsutism    History of 2019 novel coronavirus disease (COVID-19) 12/21/2020   positive home result documented in pcp note in epic 12-23-2020,  residual doe, pulmonology consult w/ dr wert note in epic 03-20-2021   History of basal cell carcinoma ([place]) excision 2016   forehead, per pt no recurrence   History of CVA (cerebrovascular accident) without residual deficits 1993   pt stated , while on Chloramphenicol, no residuaL; [medication given to treat tick bite] ; patient states  "i stroked out right next to my doctor , he said i passed out and that was my only symptom" ;  no resiudal   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 branch,  work-up results in epic 03/ 2022 (normal nuclear stress test, normal echo, no arrhythmia's per event monitor);   (05-28-2021 per pt no symptoms since 03/ 2022)    Multiple thyroid nodules    endocrinologist--- dr g. nida,  hx benign bx, clinically euthyroid   Osteopenia 12/2017   T score -1.2 FRAX 4.9% / 0.3%   PMB (postmenopausal bleeding)    Post-COVID chronic dyspnea    Rosacea    Thickened endometrium     Past Surgical History:  Procedure Laterality Date   ANTERIOR AND POSTERIOR REPAIR N/A 08/21/2020   Procedure: ANTERIOR (CYSTOCELE) AND POSTERIOR REPAIR (RECTOCELE);  Surgeon: Princess Bruins, MD;  Location: [facility];  Service: Gynecology;  Laterality: N/A;  requesting 9:00am OR time  requests one hour   ANTERIOR CERVICAL DECOMP/DISCECTOMY FUSION N/A 09/27/2017   Procedure: ANTERIOR CERVICAL DECOMPRESSION/DISCECTOMY FUSION CERVICAL FOUR-FIVE ,CERVICAL FIVE-SIX,CERVICAL SIX-SEVEN;  Surgeon: Kary Kos, MD;  Location: [facility];  Service: Neurosurgery;  Laterality: N/A;   CARPAL TUNNEL RELEASE Left 09/27/2017   Procedure: CARPAL TUNNEL RELEASE;  Surgeon: Kary Kos, MD;  Location: [place];  Service: Neurosurgery;  Laterality: Left;   FOOT SURGERY     bone removed from pinky toe   INGUINAL HERNIA REPAIR Right 1995   KNEE ARTHROSCOPY W/ MENISCAL REPAIR Left 06/21/2017   dr Rushie Nyhan   LEG SURGERY  1972   MOHS SURGERY  2016   [place] of forehead   MOUTH SURGERY  2015   removal and cauterization pallete of cancerous lesion   TONSILLECTOMY  1979   TOTAL KNEE ARTHROPLASTY Left 01/26/2018   Procedure: LEFT TOTAL KNEE  ARTHROPLASTY;  Surgeon: Latanya Maudlin, MD;  Location: [facility];  Service: Orthopedics;  Laterality: Left;   TUBAL LIGATION      Family History  Problem Relation Age of Onset   Rheum arthritis Sister    Clotting disorder Sister    Rheum arthritis Sister    Thyroid disease Sister    Clotting disorder Maternal Aunt    Clotting disorder Maternal Aunt    Heart Problems Mother    Rheum arthritis Mother    Cancer Father        ? type   Cancer Paternal Grandfather        Brain cancer    Social History:  reports that  she quit smoking about 11 years ago. Her smoking use included cigarettes. She has a 17.50 pack-year smoking history. She has never used smokeless tobacco. She reports previous alcohol use. She reports that she does not use drugs.  Allergies:  Allergies  Allergen Reactions   Influenza Vaccines Anaphylaxis and Other (See Comments)    Per patient   Penicillins Anaphylaxis and Other (See Comments)    PATIENT HAS HAD A PCN REACTION WITH IMMEDIATE RASH, FACIAL/TONGUE/THROAT SWELLING, SOB, OR LIGHTHEADEDNESS WITH HYPOTENSION:  #  #  #  YES  #  #  #   Has patient had a PCN reaction causing severe rash involving mucus membranes or skin necrosis: No PATIENT HAS HAD A PCN REACTION THAT REQUIRED HOSPITALIZATION:  #  #  #  YES  #  #  #  Has patient had a PCN reaction occurring within the last 10 years: No    Cortisone Other (See Comments)    Turned red and ran a low grade fever for 3 days   Other Rash    bandaids- skin turns red and a rash     Medications Prior to Admission  Medication Sig Dispense Refill Last Dose   albuterol (VENTOLIN HFA) 108 (90 Base) MCG/ACT inhaler Inhale 2 puffs into the lungs every 6 (six) hours as needed. 18 g 2 Past Month   doxycycline (MONODOX) 50 MG capsule Take 50 mg by mouth daily as needed (rosacea).      furosemide (LASIX) 40 MG tablet Take 60 mg by mouth daily.      spironolactone (ALDACTONE) 50 MG tablet TAKE 1 TABLET TWICE A DAY (Patient taking differently: Take 50 mg by mouth 2 (two) times daily.) 180 tablet 0    meloxicam (MOBIC) 15 MG tablet Take one pill a day with food as needed. 30 tablet 0 More than a month    REVIEW OF SYSTEMS: A ROS was performed and pertinent positives and negatives are included in the history.  GENERAL: No fevers or chills. HEENT: No change in vision, no earache, sore throat or sinus congestion. NECK: No pain or stiffness. CARDIOVASCULAR: No chest pain or pressure. No palpitations. PULMONARY: No shortness of breath, cough or wheeze.  GASTROINTESTINAL: No abdominal pain, nausea, vomiting or diarrhea, melena or bright red blood per rectum. GENITOURINARY: No urinary frequency, urgency, hesitancy or dysuria. MUSCULOSKELETAL: No joint or muscle pain, no back pain, no recent trauma. DERMATOLOGIC: No rash, no itching, no lesions. ENDOCRINE: No polyuria, polydipsia, no heat or cold intolerance. No recent change in weight. HEMATOLOGICAL: No anemia or easy bruising or bleeding. NEUROLOGIC: No headache, seizures, numbness, tingling or weakness. PSYCHIATRIC: No depression, no loss of interest in normal activity or change in sleep pattern.     Height '5\' 5"'$  (1.651 m), weight 112.6 kg.  Physical Exam:  Sono Infusion Hysterogram ( procedure note)     The initial transvaginal ultrasound demonstrated the following: T/V images.  Anteverted uterus normal in size and shape with no myometrial mass.  Both ovaries appear normal.  No adnexal mass.  No free fluid in the pelvis.   The speculum  was inserted and the cervix cleansed with Betadine solution after confirming that patient has no allergies.A small sonohysterography catheterwas utilized.  Insertion was facilitated with ring forceps, using a spear-like motion the catheter was inserted to the fundus of the uterus. The speculum is then removed carefully to avoid dislodging the catheter. The catheter was flushed with sterile saline delete prior to insertion to rid it of small amounts of air.the sterile saline solution was infused into the uterine cavity as a vaginal ultrasound probe was then placed in the vagina for full visualization of the uterine cavity from a transvaginal approach. The following was noted: After saline infusion, then sono hysterogram shows a thickened endometrium with at least 3 filling defects seen.  Compatible with endometrial polyps.   The catheter was then removed after retrieving some of the saline from the  intrauterine cavity. An endometrial biopsy was not done. Patient tolerated procedure well. She had received a tablet of Aleve for discomfort.       Assessment/Plan:  60 y.o. [id number]    1. Postmenopausal bleeding Sonohysterogram done today for postmenopausal bleeding with thickened endometrium on ultrasound.  The endometrial thickness was measured at 12.33 mm today.  The sonohysterogram showed a thickened endometrium with at least 3 filling defects seen.  Compatible with endometrial polyps.  Decision to proceed with hysteroscopy with MyoSure excisions and D&C.  Information about surgery reviewed thoroughly and pamphlet on hysteroscopy given to patient.     2. Endometrial polyp  As above.                         Patient was counseled as to the risk of surgery to include the following:   1. Infection (prohylactic antibiotics will be administered)   2. DVT/Pulmonary Embolism (prophylactic pneumo compression stockings will be used)   3.Trauma to internal organs requiring additional surgical procedure to repair any injury to internal organs requiring perhaps additional hospitalization days.   4.Hemmorhage requiring transfusion and blood products which carry risks such as anaphylactic reaction, hepatitis and AIDS   Patient had received literature information on the procedure scheduled and all her questions were answered and fully accepts all risk.      Marie-Lyne Christiano Blandon 06/03/2021, 9:48 AM

## 2021-06-03 NOTE — Op Note (Signed)
Operative Note  06/03/2021  1:00 PM  PATIENT:  Miranda Perkins  60 y.o. female  PRE-OPERATIVE DIAGNOSIS:  Endometrial polyps  POST-OPERATIVE DIAGNOSIS:  Endometrial polyps and thickened endometrium  PROCEDURE:  Procedure(s): DILATATION & CURETTAGE/HYSTEROSCOPY WITH MYOSURE EXCISIONS  SURGEON:  Surgeon(s): Princess Bruins, MD  ANESTHESIA:   general  FINDINGS: Multiple Endometrial Polyps and areas of thickened Endometrium with increased vascularity  DESCRIPTION OF OPERATION: Under general anesthesia with laryngeal mask, the patient is in lithotomy position.  She is prepped with Betadine on the supra pubic, vulvar and vaginal areas.  She is draped as usual.  Timeout is done.  The vaginal exam reveals an anteverted uterus, normal volume, mobile.  No adnexal mass.  The speculum is inserted in the vagina and the anterior lip of the cervix is grasped with a tenaculum.  A paracervical block is done with lidocaine 1% a total of 20 cc at 4 and 8:00.  Dilation of the cervix with Pratt dilators up to #19 without difficulty.  Insertion of the hysteroscope in the intra uterine cavity.  Inspection done.  Pictures taken.  Multiple small endometrial polyps are present with areas of increased endometrial thickness and increased vascularity.  Both ostia are well visualized.  The intra uterine cavity is otherwise normal.  We used the reach MyoSure to excise all the endometrial polyps as well as the areas of increased thickness.  Pictures are taken after the excisions.  Hemostasis is adequate.  The hysteroscope with MyoSure are removed.  We proceeded with a systematic curettage of the intra uterine cavity on all endometrial surfaces with a sharp curette.  The excision material and the endometrial curettings are sent together to pathology.  The sharp curette was removed from the intra uterine cavity.  The tenaculum was removed from the cervix.  Hemostasis is adequate.  The speculum is removed.  The patient is  brought to recovery room in good and stable status.  ESTIMATED BLOOD LOSS: 5 mL Fluid Deficit 170 mL  Intake/Output Summary (Last 24 hours) at 06/03/2021 1300 Last data filed at 06/03/2021 1253 Gross per 24 hour  Intake 121 ml  Output 5 ml  Net 116 ml     BLOOD ADMINISTERED:none   LOCAL MEDICATIONS USED:  LIDOCAINE   SPECIMEN:  Source of Specimen:  Endometrial Polyps/Endometrial thickenings with increased vascularity/Endometrial Curettings  DISPOSITION OF SPECIMEN:  PATHOLOGY  COUNTS:  YES  PLAN OF CARE: Transfer to PACU  Marie-Lyne LavoieMD1:00 PM

## 2021-06-03 NOTE — Anesthesia Preprocedure Evaluation (Addendum)
Anesthesia Evaluation  Patient identified by MRN, date of birth, ID band Patient awake    Reviewed: Patient's Chart, lab work & pertinent test results  Airway Mallampati: III  TM Distance: >3 FB Neck ROM: Full    Dental  (+) Partial Upper, Partial Lower   Pulmonary COPD, former smoker,    Pulmonary exam normal        Cardiovascular + DOE   Rhythm:Regular Rate:Normal     Neuro/Psych PSYCHIATRIC DISORDERS Depression CVA, No Residual Symptoms    GI/Hepatic negative GI ROS, Neg liver ROS,   Endo/Other  negative endocrine ROS  Renal/GU negative Renal ROS Bladder dysfunction      Musculoskeletal  (+) Arthritis , Osteopenia. Basal cell Ca 2016, roof of mouth cancer 2016   Abdominal (+)  Abdomen: soft. Bowel sounds: normal.  Peds  Hematology negative hematology ROS (+)   Anesthesia Other Findings   Reproductive/Obstetrics negative OB ROS                            Anesthesia Physical  Anesthesia Plan  ASA: 3  Anesthesia Plan: General   Post-op Pain Management:    Induction:   PONV Risk Score and Plan: 3 and Ondansetron, Dexamethasone, Midazolam and Treatment may vary due to age or medical condition  Airway Management Planned: LMA  Additional Equipment: None  Intra-op Plan:   Post-operative Plan: Extubation in OR  Informed Consent: I have reviewed the patients History and Physical, chart, labs and discussed the procedure including the risks, benefits and alternatives for the proposed anesthesia with the patient or authorized representative who has indicated his/her understanding and acceptance.     Dental advisory given  Plan Discussed with: CRNA, Surgeon and Anesthesiologist  Anesthesia Plan Comments:        Anesthesia Quick Evaluation

## 2021-06-03 NOTE — Transfer of Care (Signed)
Immediate Anesthesia Transfer of Care Note  Patient: Miranda Perkins  Procedure(s) Performed: DILATATION & CURETTAGE/HYSTEROSCOPY WITH MYOSURE (Vagina )  Patient Location: PACU  Anesthesia Type:General  Level of Consciousness: awake, alert  and oriented  Airway & Oxygen Therapy: Patient Spontanous Breathing and Patient connected to nasal cannula oxygen  Post-op Assessment: Report given to RN and Post -op Vital signs reviewed and stable  Post vital signs: Reviewed and stable  Last Vitals:  Vitals Value Taken Time  BP    Temp    Pulse 76 06/03/21 1304  Resp 13 06/03/21 1304  SpO2 95 % 06/03/21 1304  Vitals shown include unvalidated device data.  Last Pain:  Vitals:   06/03/21 1004  TempSrc: Oral  PainSc: 0-No pain      Patients Stated Pain Goal: 3 ([id number])  Complications: No notable events documented.

## 2021-06-03 NOTE — Discharge Instructions (Signed)
  Post Anesthesia Home Care Instructions  Activity: Get plenty of rest for the remainder of the day. A responsible adult should stay with you for 24 hours following the procedure.  For the next 24 hours, DO NOT: -Drive a car -Paediatric nurse -Drink alcoholic beverages -Take any medication unless instructed by your physician -Make any legal decisions or sign important papers.  Meals: Start with liquid foods such as gelatin or soup. Progress to regular foods as tolerated. Avoid greasy, spicy, heavy foods. If nausea and/or vomiting occur, drink only clear liquids until the nausea and/or vomiting subsides. Call your physician if vomiting continues.  Special Instructions/Symptoms: Your throat may feel dry or sore from the anesthesia or the breathing tube placed in your throat during surgery. If this causes discomfort, gargle with warm salt water. The discomfort should disappear within 24 hours.  If you had a scopolamine patch placed behind your ear for the management of post- operative nausea and/or vomiting:  1. The medication in the patch is effective for 72 hours, after which it should be removed.  Wrap patch in a tissue and discard in the trash. Wash hands thoroughly with soap and water. 2. You may remove the patch earlier than 72 hours if you experience unpleasant side effects which may include dry mouth, dizziness or visual disturbances. 3. Avoid touching the patch. Wash your hands with soap and water after contact with the patch.   DISCHARGE INSTRUCTIONS: HYSTEROSCOPY / ENDOMETRIAL ABLATION The following instructions have been prepared to help you care for yourself upon your return home.  May Remove Scop patch on or before  May take Ibuprofen after  May take stool softner while taking narcotic pain medication to prevent constipation.  Drink plenty of water.  Personal hygiene:  Use sanitary pads for vaginal drainage, not tampons.  Shower the day after your procedure.  NO tub  baths, pools or Jacuzzis for 2-3 weeks.  Wipe front to back after using the bathroom.  Activity and limitations:  Do NOT drive or operate any equipment for 24 hours. The effects of anesthesia are still present and drowsiness may result.  Do NOT rest in bed all day.  Walking is encouraged.  Walk up and down stairs slowly.  You may resume your normal activity in one to two days or as indicated by your physician. Sexual activity: NO intercourse for at least 2 weeks after the procedure, or as indicated by your Doctor.  Diet: Eat a light meal as desired this evening. You may resume your usual diet tomorrow.  Return to Work: You may resume your work activities in one to two days or as indicated by Marine scientist.  What to expect after your surgery: Expect to have vaginal bleeding/discharge for 2-3 days and spotting for up to 10 days. It is not unusual to have soreness for up to 1-2 weeks. You may have a slight burning sensation when you urinate for the first day. Mild cramps may continue for a couple of days. You may have a regular period in 2-6 weeks.  Call your doctor for any of the following:  Excessive vaginal bleeding or clotting, saturating and changing one pad every hour.  Inability to urinate 6 hours after discharge from hospital.  Pain not relieved by pain medication.  Fever of 100.4 F or greater.  Unusual vaginal discharge or odor.  Return to office _________________Call for an appointment ___________________ Patient's signature: ______________________ Nurse's signature ________________________  [facility] [phone]

## 2021-06-04 ENCOUNTER — Encounter (HOSPITAL_BASED_OUTPATIENT_CLINIC_OR_DEPARTMENT_OTHER): Payer: Self-pay | Admitting: Obstetrics & Gynecology

## 2021-06-04 LAB — SURGICAL PATHOLOGY

## 2021-06-17 ENCOUNTER — Telehealth: Payer: Self-pay

## 2021-06-17 ENCOUNTER — Other Ambulatory Visit

## 2021-06-17 ENCOUNTER — Other Ambulatory Visit: Payer: Self-pay

## 2021-06-17 DIAGNOSIS — E042 Nontoxic multinodular goiter: Secondary | ICD-10-CM

## 2021-06-17 NOTE — Telephone Encounter (Signed)
Please update lab order for [employer]

## 2021-06-17 NOTE — Telephone Encounter (Signed)
Does pt need ultrasound prior to appt?

## 2021-06-17 NOTE — Telephone Encounter (Signed)
Pt called back and said that usually Dr Dorris Fetch orders a [place] every year for her and she has not been scheduled one for this year. She would like to know if she needs this as well before her appt.

## 2021-06-18 ENCOUNTER — Other Ambulatory Visit: Payer: Self-pay

## 2021-06-18 ENCOUNTER — Ambulatory Visit (INDEPENDENT_AMBULATORY_CARE_PROVIDER_SITE_OTHER): Admitting: Obstetrics & Gynecology

## 2021-06-18 ENCOUNTER — Encounter: Payer: Self-pay | Admitting: Obstetrics & Gynecology

## 2021-06-18 VITALS — BP 118/70 | Resp 14

## 2021-06-18 DIAGNOSIS — Z09 Encounter for follow-up examination after completed treatment for conditions other than malignant neoplasm: Secondary | ICD-10-CM | POA: Diagnosis not present

## 2021-06-18 DIAGNOSIS — N840 Polyp of corpus uteri: Secondary | ICD-10-CM

## 2021-06-18 LAB — T3, FREE: T3, Free: 3.2 pg/mL (ref 2.0–4.4)

## 2021-06-18 LAB — T4, FREE: Free T4: 1.15 ng/dL (ref 0.82–1.77)

## 2021-06-18 LAB — TSH: TSH: 1.76 u[IU]/mL (ref 0.450–4.500)

## 2021-06-18 NOTE — Progress Notes (Signed)
    Miranda Perkins February 04, 1961 [id number]        60 y.o.  [id number]   RP: Postop hysteroscopy/D&C/MyoSure excisions on June 03, 2021  HPI: No pelvic pain.  No vaginal discharge and no bleeding.  No fever.   OB History  Gravida Para Term Preterm AB Living  '4 3     1 2  '$ SAB IAB Ectopic Multiple Live Births  1       3    # Outcome Date GA Lbr Len/2nd Weight Sex Delivery Anes PTL Lv  4 SAB           3 Para           2 Para           1 Para             Past medical history,surgical history, problem list, medications, allergies, family history and social history were all reviewed and documented in the EPIC chart.   Directed ROS with pertinent positives and negatives documented in the history of present illness/assessment and plan.  Exam:  Vitals:   06/18/21 1345  BP: 118/70  Resp: 14   General appearance:  Normal  Abdomen: Normal  Gynecologic exam: Vulva normal.  Bimanual exam:  Uterus AV, normal volume, NT, mobile.  No adnexal mass, NT.  Patho 06/03/2021:  FINAL MICROSCOPIC DIAGNOSIS:   A. ENDOMETRIUM, POLYP, CURETTAGE:  - Benign endometrial type polyp.  - Benign endocervical mucosa.    Assessment/Plan:  60 y.o. Miranda [id number]   1. Status post gynecological surgery, follow-up exam  Good postop healing.  No complication.  Pathology benign.  Patient reassured.  Follow-up annual gynecologic exam mid December 2022.  Princess Bruins MD, 2:01 PM 06/18/2021

## 2021-06-18 NOTE — Telephone Encounter (Signed)
Pt.notified

## 2021-06-25 ENCOUNTER — Other Ambulatory Visit: Payer: Self-pay

## 2021-06-25 ENCOUNTER — Ambulatory Visit (INDEPENDENT_AMBULATORY_CARE_PROVIDER_SITE_OTHER): Admitting: "Endocrinology

## 2021-06-25 ENCOUNTER — Encounter: Payer: Self-pay | Admitting: "Endocrinology

## 2021-06-25 VITALS — BP 126/81 | HR 80 | Ht 66.0 in | Wt 249.0 lb

## 2021-06-25 DIAGNOSIS — E042 Nontoxic multinodular goiter: Secondary | ICD-10-CM

## 2021-06-25 NOTE — Progress Notes (Signed)
06/25/2021   Endocrinology follow-up note   Subjective:    Patient ID: Miranda Perkins, female    DOB: [birth year], PCP [name], DO   Past Medical History:  Diagnosis Date   Arthritis    COPD (chronic obstructive pulmonary disease) ([place])    Edema of both lower extremities    Endometrial polyp    Hirsutism    History of 2019 novel coronavirus disease (COVID-19) 12/21/2020   positive home result documented in pcp note in epic 12-23-2020,  residual doe, pulmonology consult w/ dr wert note in epic 03-20-2021   History of basal cell carcinoma ([place]) excision 2016   forehead, per pt no recurrence   History of CVA (cerebrovascular accident) without residual deficits 1993   pt stated , while on Chloramphenicol, no residuaL; [medication given to treat tick bite] ; patient states  "i stroked out right next to my doctor , he said i passed out and that was my only symptom" ;  no resiudal   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 branch,  work-up results in epic 03/ 2022 (normal nuclear stress test, normal echo, no arrhythmia's per event monitor);   (05-28-2021 per pt no symptoms since 03/ 2022)   Multiple thyroid nodules    endocrinologist--- dr g. Brysan Mcevoy,  hx benign bx, clinically euthyroid   Osteopenia 12/2017   T score -1.2 FRAX 4.9% / 0.3%   PMB (postmenopausal bleeding)    Post-COVID chronic dyspnea    Rosacea    Thickened endometrium    Past Surgical History:  Procedure Laterality Date   ANTERIOR AND POSTERIOR REPAIR N/A 08/21/2020   Procedure: ANTERIOR (CYSTOCELE) AND POSTERIOR REPAIR (RECTOCELE);  Surgeon: Princess Bruins, MD;  [facility];  Service: Gynecology;  Laterality: N/A;  requesting 9:00am OR time  requests one hour   ANTERIOR CERVICAL DECOMP/DISCECTOMY FUSION N/A 09/27/2017    Procedure: ANTERIOR CERVICAL DECOMPRESSION/DISCECTOMY FUSION CERVICAL FOUR-FIVE ,CERVICAL FIVE-SIX,CERVICAL SIX-SEVEN;  Surgeon: Kary Kos, MD;  Location: [place];  Service: Neurosurgery;  Laterality: N/A;   CARPAL TUNNEL RELEASE Left 09/27/2017   Procedure: CARPAL TUNNEL RELEASE;  Surgeon: Kary Kos, MD;  Location: [place];  Service: Neurosurgery;  Laterality: Left;   DILATATION & CURETTAGE/HYSTEROSCOPY WITH MYOSURE N/A 06/03/2021   Procedure: DILATATION & CURETTAGE/HYSTEROSCOPY WITH MYOSURE;  Surgeon: Princess Bruins, MD;  Location: [place];  Service: Gynecology;  Laterality: N/A;   FOOT SURGERY     bone removed from pinky toe   INGUINAL HERNIA REPAIR Right 1995   KNEE ARTHROSCOPY W/ MENISCAL REPAIR Left 06/21/2017   dr Rushie Nyhan   LEG SURGERY  1972   MOHS SURGERY  2016   [place] of forehead   MOUTH SURGERY  2015   removal and cauterization pallete of cancerous lesion   TONSILLECTOMY  1979   TOTAL KNEE ARTHROPLASTY Left 01/26/2018   Procedure: LEFT TOTAL KNEE ARTHROPLASTY;  Surgeon: Latanya Maudlin, MD;  Location: [facility];  Service: Orthopedics;  Laterality: Left;   TUBAL LIGATION     Social History   Socioeconomic History   Marital status: Married    Spouse name: Not on file   Number of children: 6   Years of education: Not on file   Highest education level: Not on file  Occupational History   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 Raising 2 grandchildren of deceased daughter.       Lives in one story ranch house.    Social Determinants of Health   Financial Resource Strain: Not on file  Food Insecurity: Not on file  Transportation Needs: Not on file  Physical Activity: Not on file  Stress: Not on file  Social Connections: Not on file   Outpatient Encounter Medications as of 06/25/2021  Medication Sig   albuterol (VENTOLIN HFA) 108 (90 Base) MCG/ACT inhaler Inhale 2 puffs into the lungs every 6 (six) hours as needed.   doxycycline (MONODOX) 50 MG capsule Take 50 mg by mouth daily as needed (rosacea).   furosemide (LASIX) 40 MG tablet Take 60 mg by mouth daily.   ibuprofen (ADVIL) 800 MG tablet Take 800 mg by mouth as needed for mild pain.   meloxicam (MOBIC) 15 MG tablet Take one pill a day with food as needed.   spironolactone (ALDACTONE) 50 MG tablet TAKE 1 TABLET TWICE A DAY (Patient taking differently: Take 50 mg by mouth 2 (two) times daily.)   No facility-administered encounter medications on file as of 06/25/2021.   ALLERGIES: Allergies  Allergen Reactions   Influenza Vaccines Anaphylaxis and Other (See Comments)    Per patient   Penicillins Anaphylaxis and Other (See Comments)    PATIENT HAS HAD A PCN REACTION WITH IMMEDIATE RASH, FACIAL/TONGUE/THROAT SWELLING, SOB, OR LIGHTHEADEDNESS WITH HYPOTENSION:  #  #  #  YES  #  #  #   Has patient had a PCN reaction causing severe rash involving mucus membranes or skin necrosis: No PATIENT HAS HAD A PCN REACTION THAT REQUIRED HOSPITALIZATION:  #  #  #  YES  #  #  #  Has patient had a PCN reaction occurring within the last 10 years: No    Cortisone Other (See Comments)    Turned red and ran a low grade fever for 3 days   Other Rash    bandaids- skin turns red and a rash    VACCINATION STATUS: Immunization History  Administered Date(s) Administered   Moderna Sars-Covid-2 Vaccination 01/19/2020, 02/19/2020,  09/13/2020   Tdap 12/20/2020    HPI 60 yr old female with above medical history. She is here to follow-up for her history of multinodular goiter with repeat thyroid function tests.  -Her series of thyroid ultrasound readings  Are largely unchanged from her prior studies- Showing stable 1.7 cm nodule on the right lobe which was previously biopsied with benign findings, and stable 1.1 cm nodule on the right lobe of her thyroid.   -Her last thyroid ultrasound was from August 2021.  -Ultrasound reports from 2015 and 16 have been  scanned in her records.  -Fine-needle aspiration from November 2015 in [facility]  [place]  showed benign nodular goiter bethesda system category  II with no cytologic evidence of malignancy.  -Her previsit thyroid ultrasound is unremarkable-summarized below.  She is not on thyroid hormones nor is she on antithyroid meds.  Her previsit thyroid function tests are consistent with euthyroid state. She denies exposure to neck radiation. She denies dysphagia, SOB, voice change. She denies family hx of thyroid cancer. She has no new complaints today.  Review of Systems Constitutional:  +  Weight gain, + fatigue, no subjective hyperthermia/hypothermia   Objective:    BP 126/81   Pulse 80   Ht '5\' 6"'$  (1.676 m)   Wt 249 lb (112.9 kg)   BMI 40.19 kg/m   Wt Readings from Last 3 Encounters:  06/25/21 249 lb (112.9 kg)  06/03/21 248 lb 4.8 oz (112.6 kg)  03/27/21 248 lb 3.2 oz (112.6 kg)    Physical Exam Constitutional: + Obese, not in acute distress, normal state of mind. Eyes: PERRLA, EOMI, no exophthalmos Thyroid exam: Palpable nodular thyroid.  Labs from 11/27/2015 showed TSH and free T4 within normal range.  Thyroid ultrasound from Mar 27, 2015 from [place] was reviewed . Largest nodule on right lobe 1.7 cms .   Recent Results (from the past 2160 hour(s))  Cytology - PAP( )     Status: None   Collection Time: 03/28/21  4:42 PM  Result  Value Ref Range   Adequacy      Satisfactory for evaluation; transformation zone component PRESENT.   Diagnosis      - Negative for intraepithelial lesion or malignancy (NILM)  CBC     Status: None   Collection Time: 06/03/21 10:30 AM  Result Value Ref Range   WBC 6.5 4.0 - 10.5 K/uL   RBC 4.95 3.87 - 5.11 MIL/uL   Hemoglobin 14.6 12.0 - 15.0 g/dL   HCT 44.2 36.0 - 46.0 %   MCV 89.3 80.0 - 100.0 fL   MCH 29.5 26.0 - 34.0 pg   MCHC 33.0 30.0 - 36.0 g/dL   RDW 13.0 11.5 - 15.5 %   Platelets 278 150 - 400 K/uL   nRBC 0.0 0.0 - 0.2 %    Comment: Performed at [facility], [place] [address] [place], [place] [address]  Surgical pathology     Status: None   Collection Time: 06/03/21 12:48 PM  Result Value Ref Range   SURGICAL PATHOLOGY      SURGICAL PATHOLOGY CASE: [id number] PATIENT: Aianna Lobdell Surgical Pathology Report     Clinical History: Endometrial polyps; excision material from EM polyp, thickened endometrium with increased vascularities, EMC (crm)     FINAL MICROSCOPIC DIAGNOSIS:  A. ENDOMETRIUM, POLYP, CURETTAGE: - Benign endometrial type polyp. - Benign endocervical mucosa.   GROSS DESCRIPTION:  The specimen is received in formalin partially in a mesh bag, and consists of a 3.1 x 3.0 x 0.3 cm aggregate of tan-red mucus, and a 2.6 x 2.1 x 1.2 cm aggregate of tan-pink soft tissue fragments.  The specimen is entirely submitted in 4 cassettes.  Craig Staggers 06/03/2021)    Final Diagnosis performed by Vicente Males, MD.   Electronically signed 06/04/2021 Technical and / or Professional components performed at [facility], [place] [address], [place], [place] [address].  Immunohistochemistry Technical component (if applicable) was performed at [facility] [address] [place], [place], [place] [address].   IMMUNOHISTOCHEMISTRY DISCLAIMER (if applicable): Some of these immunohistochemical stains may have been  developed  and the performance characteristics determine by [facility]. Some may not have been cleared or approved by the U.S. Food and Drug Administration. The FDA has determined that such clearance or approval is not necessary. This test is used for clinical purposes. It should not be regarded as investigational or for research. This laboratory is certified under the [place] (CLIA-88) as qualified to perform high complexity clinical laboratory testing.  The controls stained appropriately.   T4, free     Status: None   Collection Time: 06/17/21  2:12 PM  Result Value Ref Range   Free T4 1.15 0.82 - 1.77 ng/dL  TSH     Status: None   Collection Time: 06/17/21  2:12 PM  Result Value Ref Range   TSH 1.760 0.450 - 4.500 uIU/mL  T3, free     Status: None   Collection Time: 06/17/21  2:12 PM  Result Value Ref Range   T3, Free 3.2 2.0 - 4.4 pg/mL     Her repeat thyroid ultrasound on 03/26/2016 at [facility] radiology showed unchanged sonogram of the thyroid with 1.7 and 1.1 cm nodules.  Fine-needle aspiration on 09/28/2014 on her right lobe nodule   in [facility] which showed abundant normal follicular cells and a few clusters of intact normal follicles, a small component of lymphocytes mixed with isolated Hurthle cells.  June 22, 2018 thyroid ultrasound shows right lobe 5.3 cm, increasing from 5 cm with 2 nodules stable 1.7 cm nodule previously biopsied, stable 1.1 cm nodule not biopsied.  Left lobe measures 4.3 cm, increasing from 4 cm with new 0.4 cm nodule.   Thyroid ultrasound on June 24, 2020  Right lobe 5.5 cm, left lobe 4.9 cm IMPRESSION: 1. Confirmed 1 year stability of previously identified thyroid nodules. No interval growth or development of suspicious features. 2. Approximately 1.1 cm TI-RADS category 4 nodule in the right superior gland continues to meet criteria for annual  observation. Recommend repeat ultrasound in 1 year. 3. Approximately 1.7 cm TI-RADS category 3 nodule in the right mid gland continues to meet criteria for annual observation. Recommend follow-up in 1 year.   Assessment & Plan:   1. Multiple thyroid nodules/Nontoxic MNG  -She continues to have euthyroid multinodular goiter.  See notes from previous visits.   previously biopsied 1.7 cm nodule on the right lobe is a biggest.  Cytology findings were considered benign. -Her previsit thyroid function tests continue to be within normal limits.  She would not require antithyroid intervention or thyroid hormone supplement at this time.    -She will have repeat thyroid ultrasound anytime, and thyroid function test before her next visit in 6 months.    - I advised patient to maintain close follow up with [name], DO for primary care needs.   I spent 21 minutes in the care of the patient today including review of labs from Thyroid Function, CMP, and other relevant labs ; imaging/biopsy records (current and previous including abstractions from other facilities); face-to-face time discussing  her lab results and symptoms, medications doses, her options of short and long term treatment based on the latest standards of care / guidelines;   and documenting the encounter.  Miranda Perkins  participated in the discussions, expressed understanding, and voiced agreement with the above plans.  All questions were answered to her satisfaction. she is encouraged to contact clinic should she have any questions or concerns prior to her return visit.  Follow up plan: Return in about 6 months (around 12/26/2021), or U/S anytime, for F/U with Pre-visit Labs, Thyroid / Neck Ultrasound.  Glade Lloyd, MD Phone: [phone]  Fax: [phone]   06/25/2021, 3:01 PM

## 2021-07-02 ENCOUNTER — Other Ambulatory Visit: Payer: Self-pay | Admitting: Cardiology

## 2021-07-04 ENCOUNTER — Ambulatory Visit (HOSPITAL_COMMUNITY)

## 2021-07-17 ENCOUNTER — Other Ambulatory Visit: Payer: Self-pay

## 2021-07-17 ENCOUNTER — Ambulatory Visit (HOSPITAL_COMMUNITY)
Admission: RE | Admit: 2021-07-17 | Discharge: 2021-07-17 | Disposition: A | Discharge status: Home / Self Care | Source: Ambulatory Visit | Attending: "Endocrinology | Admitting: "Endocrinology

## 2021-07-17 DIAGNOSIS — E042 Nontoxic multinodular goiter: Secondary | ICD-10-CM | POA: Insufficient documentation

## 2021-07-19 ENCOUNTER — Other Ambulatory Visit: Payer: Self-pay | Admitting: Family Medicine

## 2021-07-19 DIAGNOSIS — M7989 Other specified soft tissue disorders: Secondary | ICD-10-CM

## 2021-07-19 DIAGNOSIS — R7989 Other specified abnormal findings of blood chemistry: Secondary | ICD-10-CM

## 2021-07-19 DIAGNOSIS — L680 Hirsutism: Secondary | ICD-10-CM

## 2021-07-21 NOTE — Telephone Encounter (Signed)
Miranda Perkins

## 2021-08-01 ENCOUNTER — Other Ambulatory Visit: Payer: Self-pay

## 2021-08-01 ENCOUNTER — Encounter: Payer: Self-pay | Admitting: Cardiology

## 2021-08-01 ENCOUNTER — Ambulatory Visit (INDEPENDENT_AMBULATORY_CARE_PROVIDER_SITE_OTHER): Admitting: Cardiology

## 2021-08-01 VITALS — BP 136/80 | HR 76 | Ht 66.0 in | Wt 247.6 lb

## 2021-08-01 DIAGNOSIS — M7989 Other specified soft tissue disorders: Secondary | ICD-10-CM

## 2021-08-01 DIAGNOSIS — R079 Chest pain, unspecified: Secondary | ICD-10-CM

## 2021-08-01 DIAGNOSIS — R600 Localized edema: Secondary | ICD-10-CM

## 2021-08-01 DIAGNOSIS — R0602 Shortness of breath: Secondary | ICD-10-CM

## 2021-08-01 DIAGNOSIS — R002 Palpitations: Secondary | ICD-10-CM

## 2021-08-01 DIAGNOSIS — R7989 Other specified abnormal findings of blood chemistry: Secondary | ICD-10-CM

## 2021-08-01 DIAGNOSIS — L680 Hirsutism: Secondary | ICD-10-CM

## 2021-08-01 MED ORDER — SPIRONOLACTONE 50 MG PO TABS: 50.0000 mg | ORAL_TABLET | Freq: Every day | ORAL | Status: DC

## 2021-08-01 NOTE — Patient Instructions (Signed)
Medication Instructions:  Continue all current medications.   Labwork: none  Testing/Procedures: none  Follow-Up: 6 months   Any Other Special Instructions Will Be Listed Below (If Applicable).   If you need a refill on your cardiac medications before your next appointment, please call your pharmacy.  

## 2021-08-01 NOTE — Progress Notes (Signed)
Clinical Summary Ms. Miranda Perkins is a 60 y.o.female seen today for follow up of the following medical problems.      1. SOB/Leg edema - started about 4 months ago - DOE with walking from car to house.  - bilateral swelling legs. +orthopnea - no coughing, no wheezing. Former smoker 30 years.  - has been on HCTZ x 1 month without imprpvement - weight up 8 lbs since 08/2018   07/2018 echo LVEF 72-53%, normal diastolic function - last visit we stopped HCTZ and started lasix 40mg  daily. Did not have much weight loss, we increased to 60mg  daily. On this regimen dropped from 243 to 239 in about a week. Follow up labs were stable       12/2020 echo: LVEF 65-70%, no WMAs, indet dd, normal RV 12/2020 nuclear stress: no ischemia - former smoker x 20 years. Some ongoing SOB/DOE - 01/2021 PFTs moderate obstruction  - SOB has improved - back on lasix 60mg  daily, welling overall doing well   2. Palpitations 12/2020 event monitor: short runs of SVT, rare - infrequent episodes since wearing monitor  - mild infrequent symptoms.      3. Chest pain 12/2020 nuclear stress: no ischemia - no recurrent chest pains.        SH: husband is Nohely Whitehorn   Has  2 23 yo grandchildren (twins) that live with her   Past Medical History:  Diagnosis Date   Arthritis    COPD (chronic obstructive pulmonary disease) ([place])    Edema of both lower extremities    Endometrial polyp    Hirsutism    History of 2019 novel coronavirus disease (COVID-19) 12/21/2020   positive home result documented in pcp note in epic 12-23-2020,  residual doe, pulmonology consult w/ dr wert note in epic 03-20-2021   History of basal cell carcinoma ([place]) excision 2016   forehead, per pt no recurrence   History of CVA (cerebrovascular accident) without residual deficits 1993   pt stated , while on Chloramphenicol, no residuaL; [medication given to treat tick bite] ; patient states  "i stroked out right next to my doctor , he  said i passed out and that was my only symptom" ;  no resiudal   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 Latisia Hilaire,  work-up results in epic 03/ 2022 (normal nuclear stress test, normal echo, no arrhythmia's per event monitor);   (05-28-2021 per pt no symptoms since 03/ 2022)   Multiple thyroid nodules    endocrinologist--- dr g. nida,  hx benign bx, clinically euthyroid   Osteopenia 12/2017   T score -1.2 FRAX 4.9% / 0.3%   PMB (postmenopausal bleeding)    Post-COVID chronic dyspnea    Rosacea    Thickened endometrium      Allergies  Allergen Reactions   Influenza Vaccines Anaphylaxis and Other (See Comments)    Per patient   Penicillins Anaphylaxis and Other (See Comments)    PATIENT HAS HAD A PCN REACTION WITH IMMEDIATE RASH, FACIAL/TONGUE/THROAT SWELLING, SOB, OR LIGHTHEADEDNESS WITH HYPOTENSION:  #  #  #  YES  #  #  #   Has patient had a PCN reaction causing severe rash involving mucus membranes or skin necrosis: No PATIENT HAS HAD A PCN REACTION THAT REQUIRED HOSPITALIZATION:  #  #  #  YES  #  #  #  Has patient had a PCN reaction occurring within the last 10 years: No    Cortisone Other (See Comments)    Turned red and ran a low grade fever for 3 days   Other Rash    bandaids- skin turns red and a rash      Current Outpatient Medications  Medication Sig Dispense Refill   albuterol (VENTOLIN HFA) 108 (90 Base) MCG/ACT inhaler Inhale 2 puffs into the lungs every 6 (six) hours as needed. 18 g 2   doxycycline (MONODOX) 50 MG capsule Take 50 mg by mouth daily as needed (rosacea).     furosemide (LASIX) 40 MG tablet TAKE ONE AND ONE-HALF TABLETS DAILY 135 tablet 3   ibuprofen (ADVIL) 800 MG tablet Take 800 mg by mouth as needed for mild pain.     meloxicam (MOBIC) 15 MG tablet Take one pill a day with food as needed. 30 tablet 0    spironolactone (ALDACTONE) 50 MG tablet TAKE 1 TABLET TWICE A DAY 180 tablet 0   No current facility-administered medications for this visit.     Past Surgical History:  Procedure Laterality Date   ANTERIOR AND POSTERIOR REPAIR N/A 08/21/2020   Procedure: ANTERIOR (CYSTOCELE) AND POSTERIOR REPAIR (RECTOCELE);  Surgeon: Princess Bruins, MD;  Location: [facility];  Service: Gynecology;  Laterality: N/A;  requesting 9:00am OR time  requests one hour   ANTERIOR CERVICAL DECOMP/DISCECTOMY FUSION N/A 09/27/2017   Procedure: ANTERIOR CERVICAL DECOMPRESSION/DISCECTOMY FUSION CERVICAL FOUR-FIVE ,CERVICAL FIVE-SIX,CERVICAL SIX-SEVEN;  Surgeon: Kary Kos, MD;  Location: [place];  Service: Neurosurgery;  Laterality: N/A;   CARPAL TUNNEL RELEASE Left 09/27/2017   Procedure: CARPAL TUNNEL RELEASE;  Surgeon: Kary Kos, MD;  Location: [place];  Service: Neurosurgery;  Laterality: Left;   DILATATION & CURETTAGE/HYSTEROSCOPY WITH MYOSURE N/A 06/03/2021   Procedure: DILATATION & CURETTAGE/HYSTEROSCOPY WITH MYOSURE;  Surgeon: Princess Bruins, MD;  Location: [place];  Service: Gynecology;  Laterality: N/A;   FOOT SURGERY     bone removed from pinky toe   INGUINAL HERNIA REPAIR Right 1995   KNEE ARTHROSCOPY W/ MENISCAL REPAIR Left 06/21/2017   dr Rushie Nyhan   LEG SURGERY  1972   MOHS SURGERY  2016   [place] of forehead   MOUTH SURGERY  2015   removal and cauterization pallete of cancerous lesion   TONSILLECTOMY  1979   TOTAL KNEE ARTHROPLASTY Left 01/26/2018   Procedure: LEFT TOTAL KNEE ARTHROPLASTY;  Surgeon: Latanya Maudlin, MD;  Location: [facility];  Service: Orthopedics;  Laterality: Left;   TUBAL LIGATION       Allergies  Allergen Reactions   Influenza Vaccines Anaphylaxis and Other (See Comments)    Per patient   Penicillins Anaphylaxis and Other (See Comments)    PATIENT HAS HAD A PCN REACTION WITH IMMEDIATE RASH, FACIAL/TONGUE/THROAT SWELLING, SOB, OR  LIGHTHEADEDNESS WITH HYPOTENSION:  #  #  #  YES  #  #  #   Has patient had a PCN reaction causing severe rash involving mucus membranes or skin necrosis: No PATIENT HAS HAD A PCN REACTION THAT REQUIRED HOSPITALIZATION:  #  #  #  YES  #  #  #  Has patient had a PCN reaction occurring within the last 10 years: No    Cortisone Other (See Comments)    Turned red and ran a low grade fever for 3 days   Other Rash    bandaids- skin turns red and  a rash       Family History  Problem Relation Age of Onset   Rheum arthritis Sister    Clotting disorder Sister    Rheum arthritis Sister    Thyroid disease Sister    Clotting disorder Maternal Aunt    Clotting disorder Maternal Aunt    Heart Problems Mother    Rheum arthritis Mother    Cancer Father        ? type   Cancer Paternal Grandfather        Brain cancer     Social History Ms. Nan reports that she quit smoking about 11 years ago. Her smoking use included cigarettes. She has a 17.50 pack-year smoking history. She has never used smokeless tobacco. Ms. Clarkston reports that she does not currently use alcohol.   Review of Systems CONSTITUTIONAL: No weight loss, fever, chills, weakness or fatigue.  HEENT: Eyes: No visual loss, blurred vision, double vision or yellow sclerae.No hearing loss, sneezing, congestion, runny nose or sore throat.  SKIN: No rash or itching.  CARDIOVASCULAR: per hpi RESPIRATORY: No shortness of breath, cough or sputum.  GASTROINTESTINAL: No anorexia, nausea, vomiting or diarrhea. No abdominal pain or blood.  GENITOURINARY: No burning on urination, no polyuria NEUROLOGICAL: No headache, dizziness, syncope, paralysis, ataxia, numbness or tingling in the extremities. No change in bowel or bladder control.  MUSCULOSKELETAL: No muscle, back pain, joint pain or stiffness.  LYMPHATICS: No enlarged nodes. No history of splenectomy.  PSYCHIATRIC: No history of depression or anxiety.  ENDOCRINOLOGIC: No reports of  sweating, cold or heat intolerance. No polyuria or polydipsia.  [name]   Physical Examination Today's Vitals   08/01/21 1422  BP: 136/80  Pulse: 76  SpO2: 95%  Weight: 247 lb 9.6 oz (112.3 kg)  Height: 5\' 6"  (1.676 m)   Body mass index is 39.96 kg/m.  Gen: resting comfortably, no acute distress HEENT: no scleral icterus, pupils equal round and reactive, no palptable cervical adenopathy,  CV: RRR, no m/r/g, no jvd Resp: Clear to auscultation bilaterally GI: abdomen is soft, non-tender, non-distended, normal bowel sounds, no hepatosplenomegaly MSK: extremities are warm, no edema.  Skin: warm, no rash Neuro:  no focal deficits Psych: appropriate affect   Diagnostic Studies 07/2018 echo Study Conclusions   - Left ventricle: The cavity size was normal. Wall thickness was   increased in a pattern of mild LVH. Systolic function was normal.   The estimated ejection fraction was in the range of 60% to 65%.   Wall motion was normal; there were no regional wall motion   abnormalities. Left ventricular diastolic function parameters   were normal. - Mitral valve: There was trivial regurgitation. - Right atrium: Central venous pressure (est): 3 mm Hg. - Atrial septum: No defect or patent foramen ovale was identified. - Tricuspid valve: There was trivial regurgitation. - Pulmonary arteries: Systolic pressure could not be accurately   estimated. - Pericardium, extracardiac: A prominent pericardial fat pad was   present, rule out trivial pericardial effusion.   Impressions:   - A prominent pericardial fat pad was present, rule out trivial   pericardial effusion. If concern remains about epicardial mass, a   chest CT would likely clarify this further.  01/2021 PFTs: moderate obstruction   Assessment and Plan  1. SOB - recent benign echo and stress test - seen by pulmonary without recs for further testing - symptoms have improved, continue to monitor   2. LE edema - started on  aldactone for hirsutism  by pcp - remains on lasix for LE edema, overall controlled   3. Chest pain - negative stress test - no recurrent symptoms, continue to monitor   4. Palpitations - fairly benign event monitor, rare ectopy and SVT. - symptoms remain infrequent, monitor at this time. If progression could start beta blocker or caclcium channel blocker.     F/u 6 months   Arnoldo Lenis, M.D.

## 2021-08-15 IMAGING — DX DG CHEST 2V
2 series · 2 of 2 positions shown · non-contrast
Comparison: 12/20/2020, abdominal CT 11/29/2020

CLINICAL DATA: Dyspnea on exertion and cough post COVID December 2020.

EXAM:
CHEST - 2 VIEW

[chest pa]
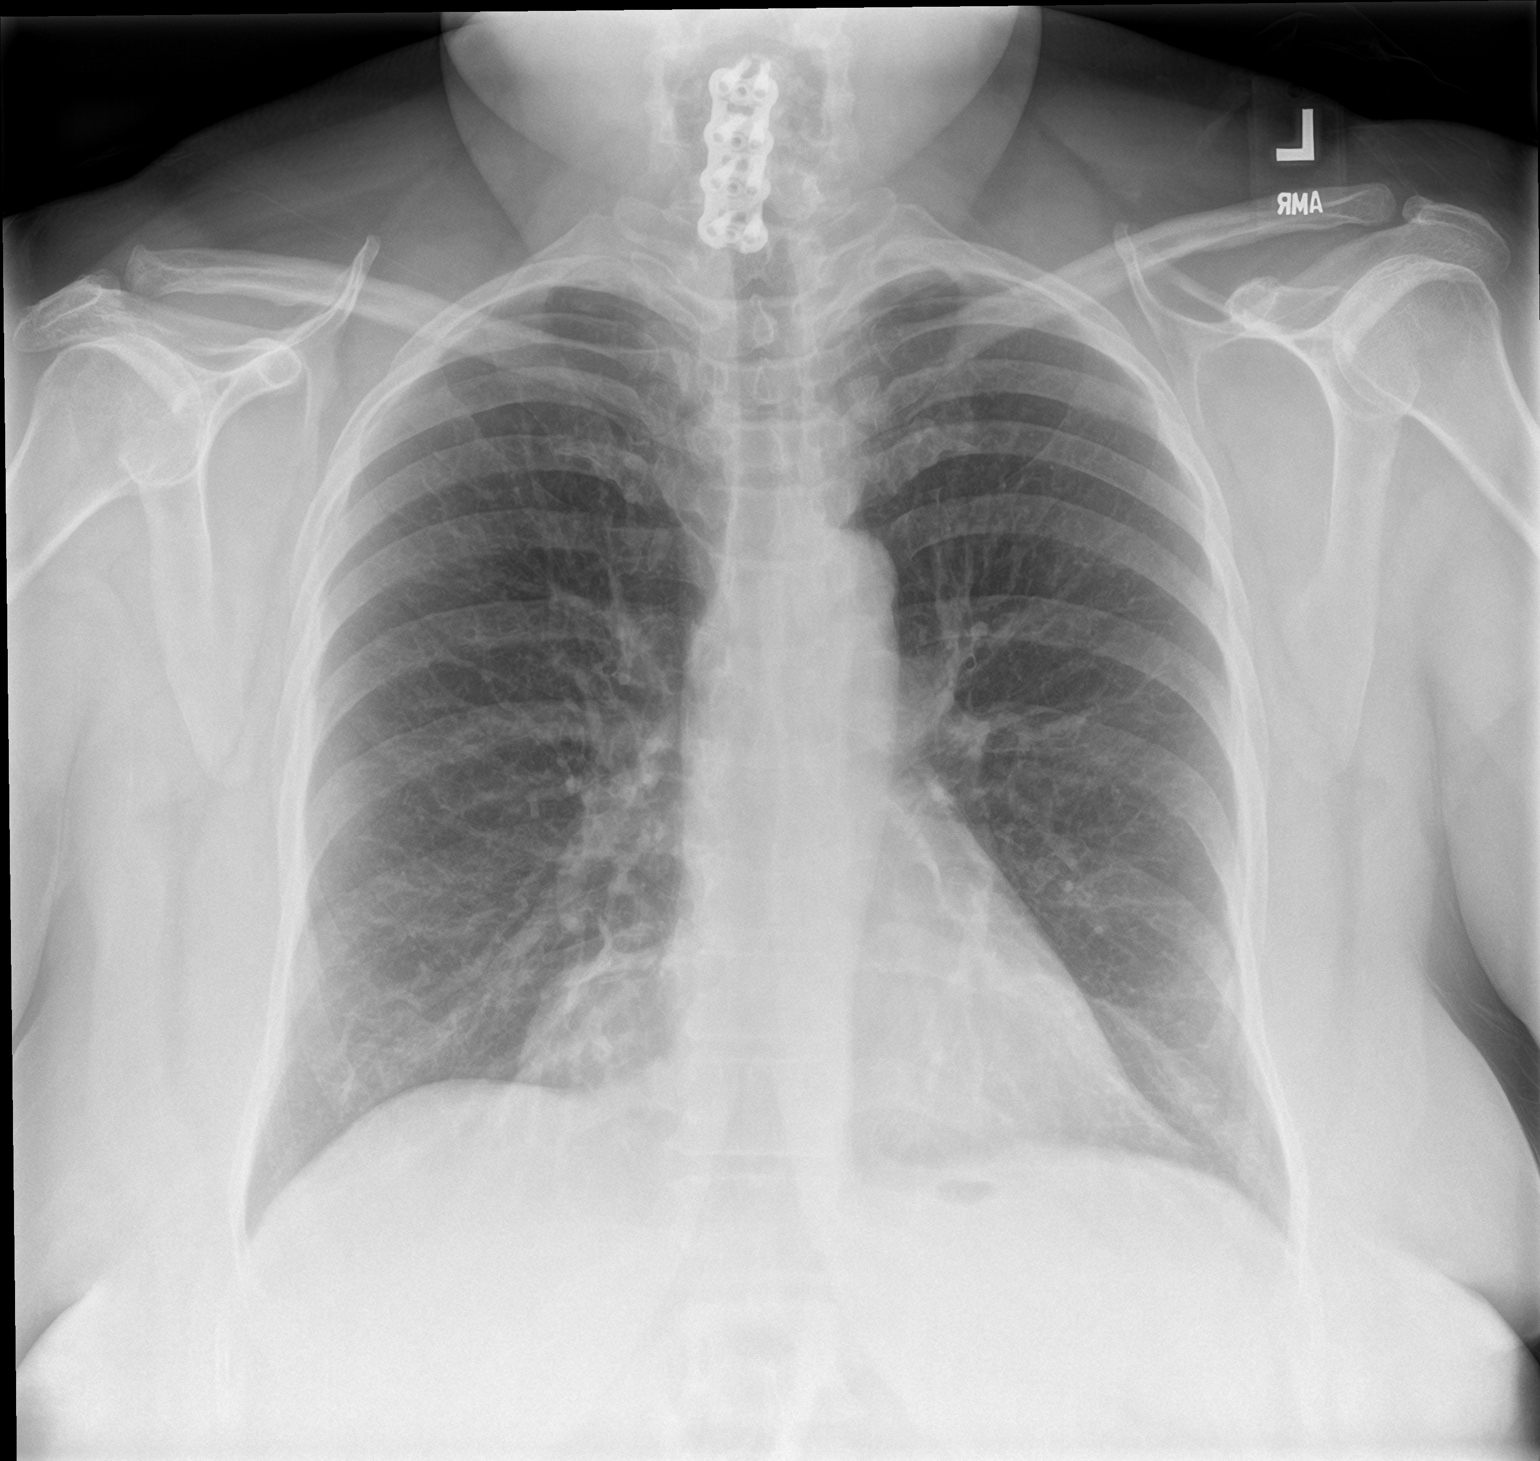

[chest lat]
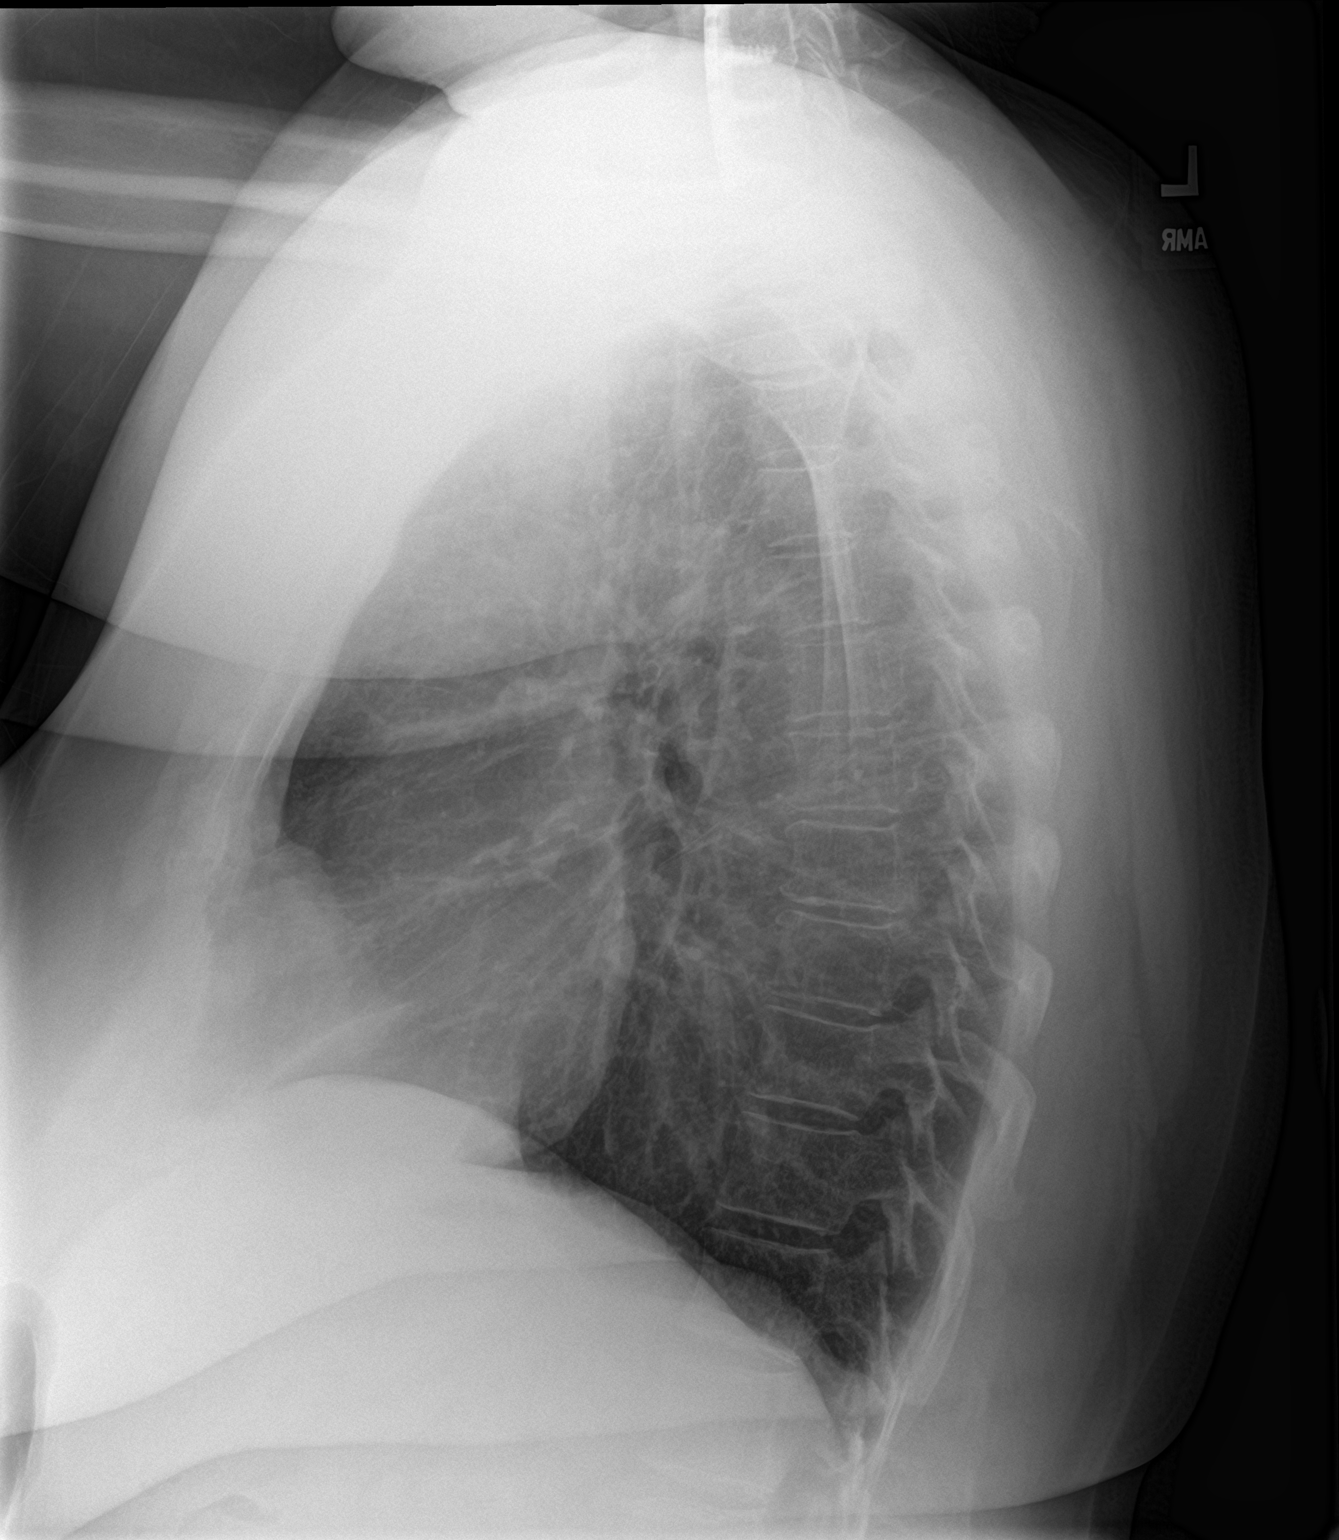

[2 of 2 positions shown; findings below may reference images not displayed]

FINDINGS: Lungs are adequately inflated without consolidation or effusion.
Stable focal prominent pericardiac fat over the right
pericardiophrenic angle. Cardiomediastinal silhouette and remainder
of the exam is unchanged.
IMPRESSION: No active cardiopulmonary disease.

## 2021-08-21 IMAGING — MG MM DIGITAL SCREENING BILAT W/ TOMO AND CAD
8 series · 8 of 24 positions shown · non-contrast
Comparison: Previous exam(s).

CLINICAL DATA: Screening.

EXAM:
DIGITAL SCREENING BILATERAL MAMMOGRAM WITH TOMOSYNTHESIS AND CAD
TECHNIQUE: Bilateral screening digital craniocaudal and mediolateral oblique
mammograms were obtained. Bilateral screening digital breast
tomosynthesis was performed. The images were evaluated with
computer-aided detection.

[R MLO synth-2D]
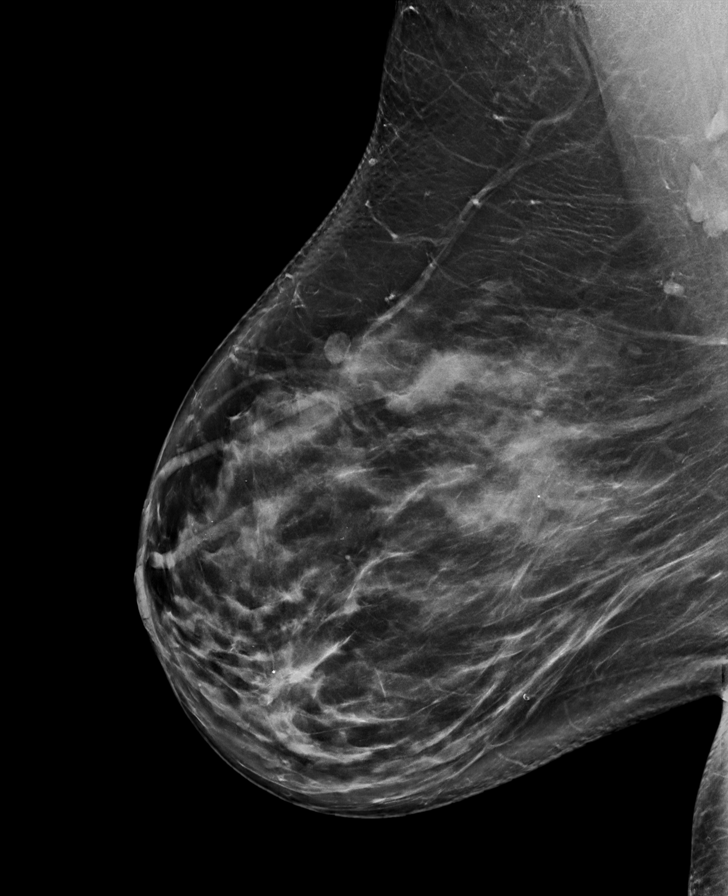

[R CC synth-2D]
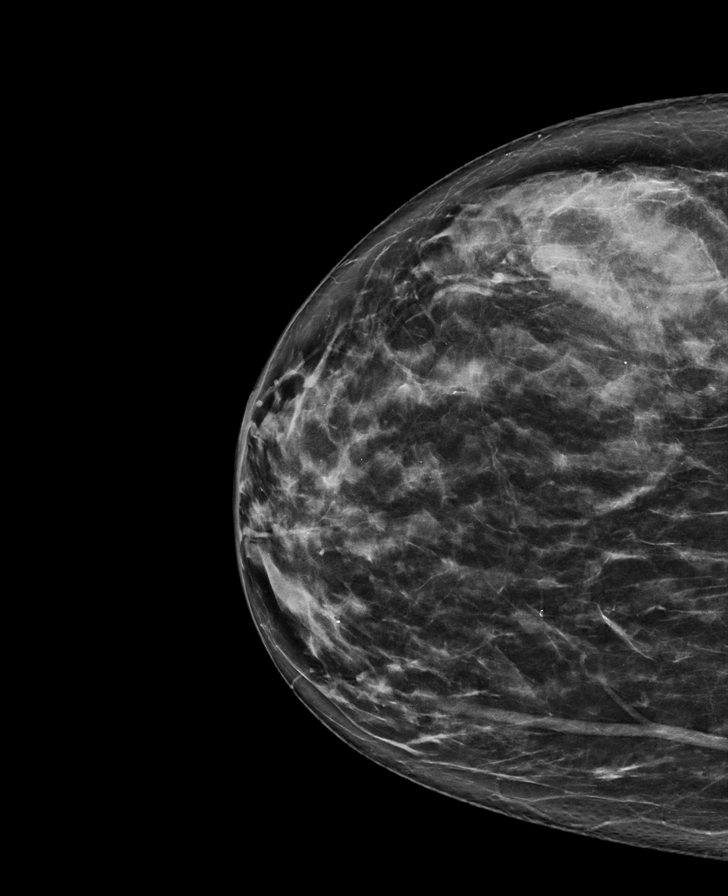

[L MLO synth-2D]
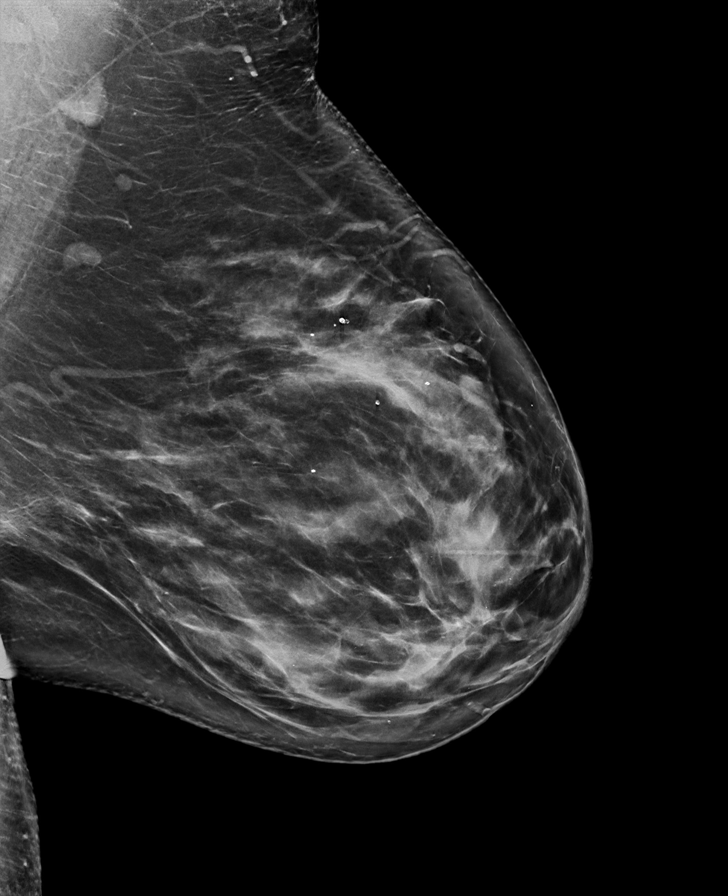

[L CC synth-2D]
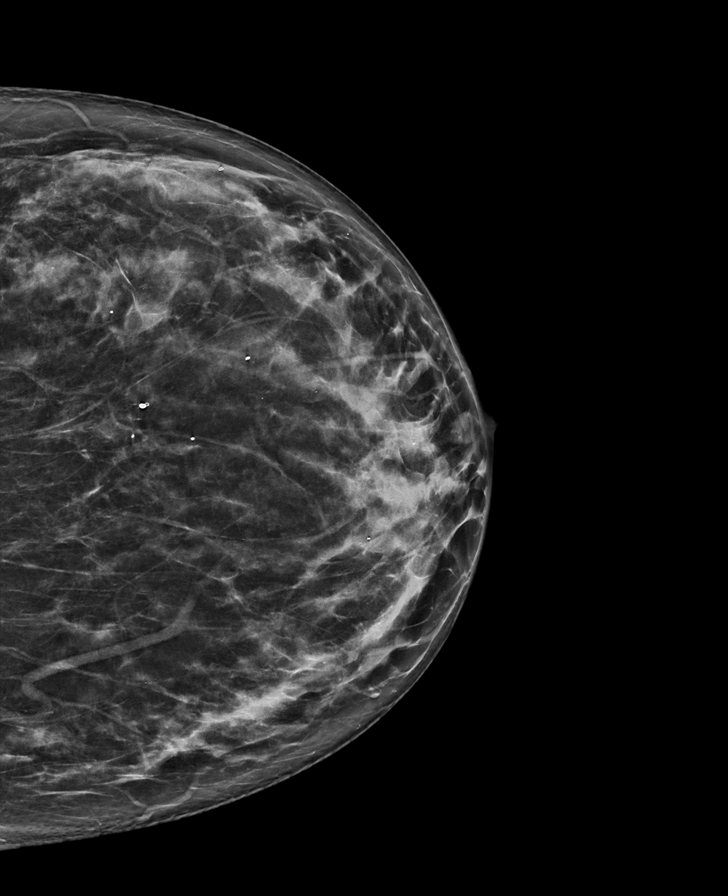

[R CC tomo · tomo slice 39/77.0]
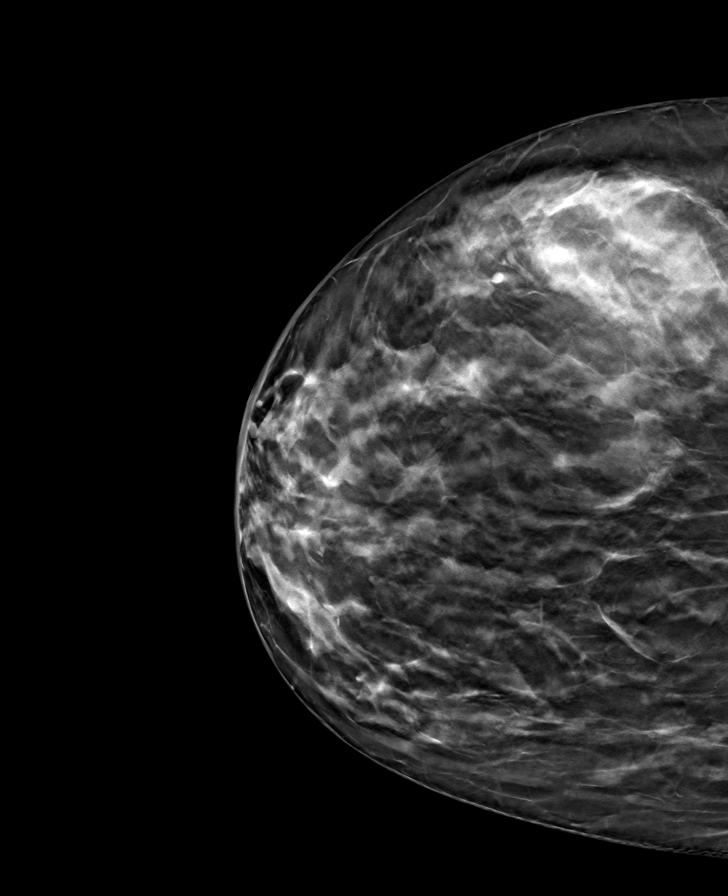

[L MLO tomo · tomo slice 47/94.0]
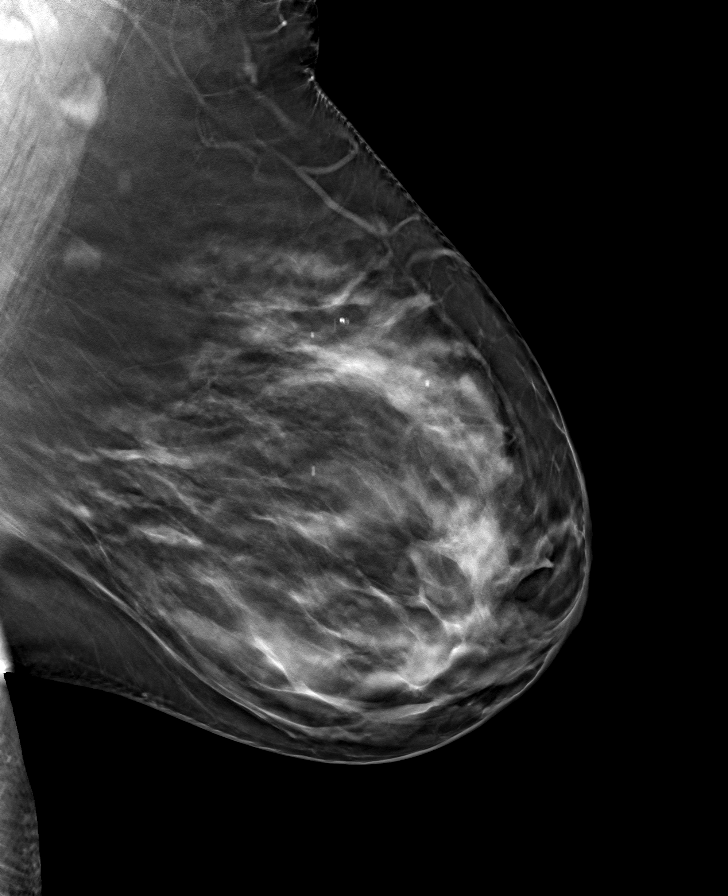

[R MLO tomo · tomo slice 49/98.0]
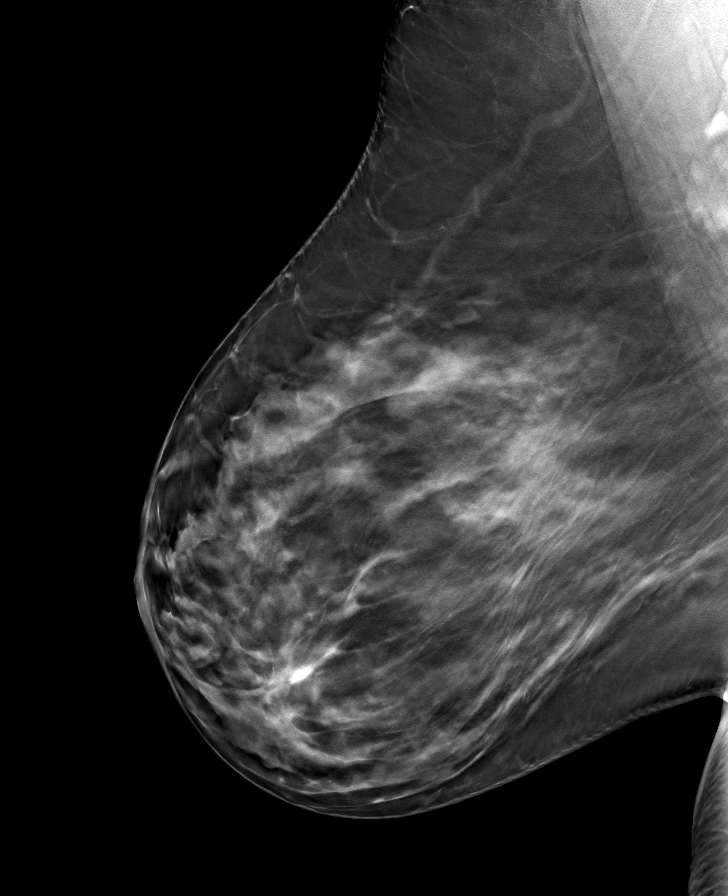

[L CC tomo · tomo slice 38/75.0]
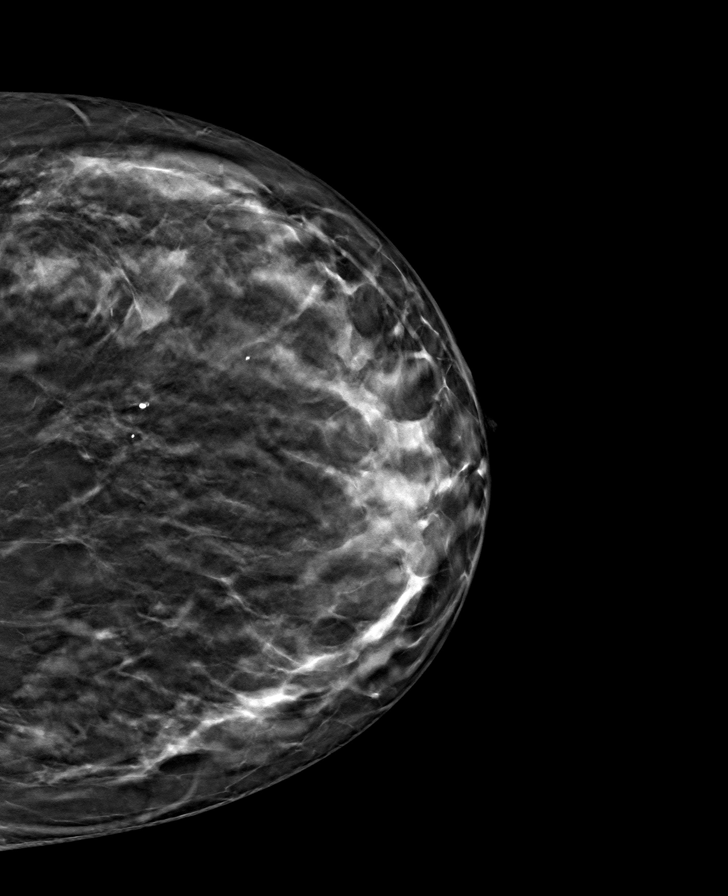

[8 of 24 positions shown; findings below may reference images not displayed]

ACR Breast Density Category c: The breast tissue is heterogeneously
dense, which may obscure small masses.
FINDINGS: There are no findings suspicious for malignancy. The images were
evaluated with computer-aided detection.
IMPRESSION: No mammographic evidence of malignancy. A result letter of this
screening mammogram will be mailed directly to the patient.

RECOMMENDATION:
Screening mammogram in one year. (Code:T4-5-GWO)

BI-RADS CATEGORY  1: Negative.

## 2021-10-02 ENCOUNTER — Other Ambulatory Visit: Payer: Self-pay | Admitting: Family Medicine

## 2021-10-02 DIAGNOSIS — L680 Hirsutism: Secondary | ICD-10-CM

## 2021-10-02 DIAGNOSIS — R7989 Other specified abnormal findings of blood chemistry: Secondary | ICD-10-CM

## 2021-10-02 DIAGNOSIS — M7989 Other specified soft tissue disorders: Secondary | ICD-10-CM

## 2021-10-03 ENCOUNTER — Other Ambulatory Visit: Payer: Self-pay | Admitting: *Deleted

## 2021-10-03 DIAGNOSIS — R7989 Other specified abnormal findings of blood chemistry: Secondary | ICD-10-CM

## 2021-10-03 DIAGNOSIS — L680 Hirsutism: Secondary | ICD-10-CM

## 2021-10-03 DIAGNOSIS — M7989 Other specified soft tissue disorders: Secondary | ICD-10-CM

## 2021-10-03 MED ORDER — SPIRONOLACTONE 50 MG PO TABS: 50.0000 mg | ORAL_TABLET | Freq: Every day | ORAL | 3 refills | Status: DC

## 2021-10-06 ENCOUNTER — Other Ambulatory Visit: Payer: Self-pay

## 2021-10-06 DIAGNOSIS — L680 Hirsutism: Secondary | ICD-10-CM

## 2021-10-06 DIAGNOSIS — R7989 Other specified abnormal findings of blood chemistry: Secondary | ICD-10-CM

## 2021-10-06 DIAGNOSIS — M7989 Other specified soft tissue disorders: Secondary | ICD-10-CM

## 2021-10-06 MED ORDER — SPIRONOLACTONE 50 MG PO TABS
50.0000 mg | ORAL_TABLET | Freq: Every day | ORAL | 3 refills | Status: DC
Start: 2021-10-06 — End: 2021-12-29

## 2021-12-23 ENCOUNTER — Other Ambulatory Visit

## 2021-12-24 LAB — T3, FREE: T3, Free: 3.3 pg/mL (ref 2.0–4.4)

## 2021-12-24 LAB — T4, FREE: Free T4: 1.24 ng/dL (ref 0.82–1.77)

## 2021-12-24 LAB — TSH: TSH: 2.64 u[IU]/mL (ref 0.450–4.500)

## 2021-12-29 ENCOUNTER — Encounter: Payer: Self-pay | Admitting: "Endocrinology

## 2021-12-29 ENCOUNTER — Other Ambulatory Visit: Payer: Self-pay

## 2021-12-29 ENCOUNTER — Ambulatory Visit (INDEPENDENT_AMBULATORY_CARE_PROVIDER_SITE_OTHER): Admitting: "Endocrinology

## 2021-12-29 VITALS — BP 129/84 | HR 81 | Ht 66.0 in | Wt 249.2 lb

## 2021-12-29 DIAGNOSIS — E782 Mixed hyperlipidemia: Secondary | ICD-10-CM | POA: Diagnosis not present

## 2021-12-29 DIAGNOSIS — E042 Nontoxic multinodular goiter: Secondary | ICD-10-CM

## 2021-12-29 DIAGNOSIS — Z6841 Body Mass Index (BMI) 40.0 and over, adult: Secondary | ICD-10-CM | POA: Insufficient documentation

## 2021-12-29 NOTE — Patient Instructions (Signed)

## 2021-12-29 NOTE — Progress Notes (Signed)
12/29/2021   Endocrinology follow-up note   Subjective:    Patient ID: Miranda Perkins, female    DOB: [birth year], PCP [name], DO   Past Medical History:  Diagnosis Date   Arthritis    COPD (chronic obstructive pulmonary disease) ([place])    Edema of both lower extremities    Endometrial polyp    Hirsutism    History of 2019 novel coronavirus disease (COVID-19) 12/21/2020   positive home result documented in pcp note in epic 12-23-2020,  residual doe, pulmonology consult w/ dr wert note in epic 03-20-2021   History of basal cell carcinoma ([place]) excision 2016   forehead, per pt no recurrence   History of CVA (cerebrovascular accident) without residual deficits 1993   pt stated , while on Chloramphenicol, no residuaL; [medication given to treat tick bite] ; patient states  "i stroked out right next to my doctor , he said i passed out and that was my only symptom" ;  no resiudal   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 branch,  work-up results in epic 03/ 2022 (normal nuclear stress test, normal echo, no arrhythmia's per event monitor);   (05-28-2021 per pt no symptoms since 03/ 2022)   Multiple thyroid nodules    endocrinologist--- dr g. Belma Dyches,  hx benign bx, clinically euthyroid   Osteopenia 12/2017   T score -1.2 FRAX 4.9% / 0.3%   PMB (postmenopausal bleeding)    Post-COVID chronic dyspnea    Rosacea    Thickened endometrium    Past Surgical History:  Procedure Laterality Date   ANTERIOR AND POSTERIOR REPAIR N/A 08/21/2020   Procedure: ANTERIOR (CYSTOCELE) AND POSTERIOR REPAIR (RECTOCELE);  Surgeon: Princess Bruins, MD;  Location: [facility];  Service: Gynecology;  Laterality: N/A;  requesting 9:00am OR time  requests one hour   ANTERIOR CERVICAL DECOMP/DISCECTOMY FUSION N/A 09/27/2017    Procedure: ANTERIOR CERVICAL DECOMPRESSION/DISCECTOMY FUSION CERVICAL FOUR-FIVE ,CERVICAL FIVE-SIX,CERVICAL SIX-SEVEN;  Surgeon: Kary Kos, MD;  Location: [facility];  Service: Neurosurgery;  Laterality: N/A;   CARPAL TUNNEL RELEASE Left 09/27/2017   Procedure: CARPAL TUNNEL RELEASE;  Surgeon: Kary Kos, MD;  Location: [place];  Service: Neurosurgery;  Laterality: Left;   DILATATION & CURETTAGE/HYSTEROSCOPY WITH MYOSURE N/A 06/03/2021   Procedure: DILATATION & CURETTAGE/HYSTEROSCOPY WITH MYOSURE;  Surgeon: Princess Bruins, MD;  Location: [place];  Service: Gynecology;  Laterality: N/A;   FOOT SURGERY     bone removed from pinky toe   INGUINAL HERNIA REPAIR Right 1995   KNEE ARTHROSCOPY W/ MENISCAL REPAIR Left 06/21/2017   dr Rushie Nyhan   LEG SURGERY  1972   MOHS SURGERY  2016   [place] of forehead   MOUTH SURGERY  2015   removal and cauterization pallete of cancerous lesion   TONSILLECTOMY  1979   TOTAL KNEE ARTHROPLASTY Left 01/26/2018   Procedure: LEFT TOTAL KNEE ARTHROPLASTY;  Surgeon: Latanya Maudlin, MD;  Location: [facility];  Service: Orthopedics;  Laterality: Left;   TUBAL LIGATION     Social History   Socioeconomic History   Marital status: Married    Spouse name: Not on file   Number of children: 6   Years of education: Not on file   Highest education level: Not on file  Occupational History   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 Raising 2 grandchildren of deceased daughter.       Lives in one story ranch house.    Social Determinants of Health   Financial Resource Strain: Not on file  Food Insecurity: Not on file  Transportation Needs: Not on file  Physical Activity: Not on file  Stress: Not on file  Social Connections: Not on file   Outpatient Encounter Medications as of 12/29/2021  Medication Sig   Cholecalciferol (VITAMIN D3 PO) Take 1 tablet by mouth daily in the afternoon.   Cyanocobalamin (VITAMIN B-12 PO) Take 1 tablet by mouth daily in the afternoon.   albuterol (VENTOLIN HFA) 108 (90 Base) MCG/ACT inhaler Inhale 2 puffs into the lungs every 6 (six) hours as needed.   doxycycline (MONODOX) 50 MG capsule Take 50 mg by mouth daily as needed (rosacea).   furosemide (LASIX) 40 MG tablet TAKE ONE AND ONE-HALF TABLETS DAILY   ibuprofen (ADVIL) 800 MG tablet Take 800 mg by mouth as needed for mild pain.   meloxicam (MOBIC) 15 MG tablet Take one pill a day with food as needed.   [DISCONTINUED] spironolactone (ALDACTONE) 50 MG tablet Take 1 tablet (50 mg total) by mouth daily.   No facility-administered encounter medications on file as of 12/29/2021.   ALLERGIES: Allergies  Allergen Reactions   Influenza Vaccines Anaphylaxis and Other (See Comments)    Per patient   Penicillins Anaphylaxis and Other (See Comments)    PATIENT HAS HAD A PCN REACTION WITH IMMEDIATE RASH, FACIAL/TONGUE/THROAT SWELLING, SOB, OR LIGHTHEADEDNESS WITH HYPOTENSION:  #  #  #  YES  #  #  #   Has patient had a PCN reaction causing severe rash involving mucus membranes or skin necrosis: No PATIENT HAS HAD A PCN REACTION THAT REQUIRED HOSPITALIZATION:  #  #  #  YES  #  #  #  Has patient had a PCN reaction occurring within the last 10 years: No    Articaine    Cortisone Other (See Comments)    Turned red and ran a low grade fever for 3 days   Other Rash    bandaids- skin turns red and  a rash    VACCINATION STATUS: Immunization History  Administered Date(s) Administered   Moderna Sars-Covid-2 Vaccination 01/19/2020, 02/19/2020, 09/13/2020   Tdap 12/20/2020    HPI 61 yr old female with above medical history. She is here to follow-up for her history of multinodular goiter with repeat thyroid function tests.  -Her series of thyroid ultrasound readings  are largely unchanged from her prior studies showing stable nodules in her bilateral thyroid lobes.    Her most recent thyroid ultrasound from September 2022 showed 5.2 cm right lobe, 4.6 on the left lobe.  Stable bilateral thyroid nodules which  did not require any biopsy nor further follow-up. -Fine-needle aspiration from November 2015 in [place]  showed benign nodular goiter bethesda system category  II with no cytologic evidence of malignancy.  -Her previsit thyroid function tests are consistent with euthyroid presentation.   Her major concern today is  inability to lose weight.  She is not on any particular diet or exercise program.  Her weight is largely the same as her weight in August 2022.  She denies exposure to neck radiation. She denies dysphagia, SOB, voice change. She denies family hx of thyroid cancer. She has no new complaints today.  Review of Systems Constitutional:  +  Weight gain, + fatigue, no subjective hyperthermia/hypothermia   Objective:    BP 129/84    Pulse 81    Ht 5\' 6"  (1.676 m)    Wt 249 lb 3.2 oz (113 kg)    BMI 40.22 kg/m   Wt Readings from Last 3 Encounters:  12/29/21 249 lb 3.2 oz (113 kg)  08/01/21 247 lb 9.6 oz (112.3 kg)  06/25/21 249 lb (112.9 kg)    Physical Exam Constitutional: + Obese, not in acute distress, normal state of mind. Eyes: PERRLA, EOMI, no exophthalmos Thyroid exam: Palpable nodular thyroid.  Labs from 11/27/2015 showed TSH and free T4 within normal range.  Thyroid ultrasound from Mar 27, 2015 from [place] was reviewed . Largest nodule  on right lobe 1.7 cms .   Recent Results (from the past 2160 hour(s))  TSH     Status: None   Collection Time: 12/23/21 11:18 AM  Result Value Ref Range   TSH 2.640 0.450 - 4.500 uIU/mL  T4, free     Status: None   Collection Time: 12/23/21 11:18 AM  Result Value Ref Range   Free T4 1.24 0.82 - 1.77 ng/dL  T3, free     Status: None   Collection Time: 12/23/21 11:18 AM  Result Value Ref Range   T3, Free 3.3 2.0 - 4.4 pg/mL     Her repeat thyroid ultrasound on 03/26/2016 at [facility] radiology showed unchanged sonogram of the thyroid with 1.7 and 1.1 cm nodules.  Fine-needle aspiration on 09/28/2014 on her right lobe nodule   in [facility] which showed abundant normal follicular cells and a few clusters of intact normal follicles, a small component of lymphocytes mixed with isolated Hurthle cells.  June 22, 2018 thyroid ultrasound shows right lobe 5.3 cm, increasing from 5 cm with 2 nodules stable 1.7 cm nodule previously biopsied, stable 1.1 cm nodule not biopsied.  Left lobe measures 4.3 cm, increasing from 4 cm with new 0.4 cm nodule.   Thyroid ultrasound on June 24, 2020  Right lobe 5.5 cm, left lobe 4.9 cm IMPRESSION: 1. Confirmed 1 year stability of previously identified thyroid nodules. No interval growth or development of suspicious features. 2. Approximately 1.1 cm TI-RADS category 4 nodule in the right superior gland continues to meet criteria for annual observation. Recommend repeat ultrasound in 1 year. 3. Approximately 1.7 cm TI-RADS category 3 nodule in the right mid gland continues to meet criteria for annual observation. Recommend follow-up in 1 year.  Repeat, previsit thyroid ultrasound in September 2022 showed similar or unchanged findings.   Assessment & Plan:   1. Multiple thyroid nodules/Nontoxic MNG 2.  Obesity 3.  Dyslipidemia -She continues to have euthyroid multinodular goiter.  She will not need biopsy nor further  follow-up at this time.    I discussed her  recent thyroid ultrasound findings showing stable nodules bilaterally.  She did have previous biopsy showing benign findings in out of town facility. She will need repeat thyroid function test in 6 months..   Regarding her concern for weight management as well as untreated hyperlipidemia, this patient will benefit the most from lifestyle medicine.  - she acknowledges that there is a room for improvement in her food and drink choices. - Suggestion is made for her to avoid simple carbohydrates  from her diet including Cakes, Sweet Desserts, Ice Cream, Soda (diet and regular), Sweet Tea, Candies, Chips, Cookies, Store Bought Juices, Alcohol , Artificial Sweeteners,  Coffee Creamer, and "Sugar-free" Products, Lemonade. This will help patient to have more stable blood glucose profile and potentially avoid unintended weight gain.  The following Lifestyle Medicine recommendations according to [facility]  [facility]) were discussed and and offered to patient and she  agrees to start the journey:  A. Whole Foods, Plant-Based Nutrition comprising of fruits and vegetables, plant-based proteins, whole-grain carbohydrates was discussed in detail with the patient.   A list for source of those nutrients were also provided to the patient.  Patient will use only water or unsweetened tea for hydration. B.  The need to stay away from risky substances including alcohol, smoking; obtaining 7 to 9 hours of restorative sleep, at least 150 minutes of moderate intensity exercise weekly, the importance of healthy social connections,  and stress management techniques were discussed. C.  A full color page of  Calorie density of various food groups per pound showing examples of each food groups was provided to the patient.   - I advised patient to maintain close follow up with [name], DO for primary care needs.   I spent 32 minutes in the care of  the patient today including review of labs from Thyroid Function, CMP, and other relevant labs ; imaging/biopsy records (current and previous including abstractions from other facilities); face-to-face time discussing  her lab results and symptoms, medications doses, her options of short and long term treatment based on the latest standards of care / guidelines;   and documenting the encounter.  Miranda Perkins  participated in the discussions, expressed understanding, and voiced agreement with the above plans.  All questions were answered to her satisfaction. she is encouraged to contact clinic should she have any questions or concerns prior to her return visit.    Follow up plan: Return in about 6 months (around 06/28/2022) for Fasting Labs  in AM B4 8.  Glade Lloyd, MD Phone: [phone]  Fax: [phone]   12/29/2021, 2:23 PM

## 2022-05-26 ENCOUNTER — Encounter: Payer: Self-pay | Admitting: Cardiology

## 2022-05-26 ENCOUNTER — Ambulatory Visit: Admitting: Cardiology

## 2022-05-26 VITALS — BP 116/80 | HR 70 | Ht 66.0 in | Wt 250.6 lb

## 2022-05-26 DIAGNOSIS — R600 Localized edema: Secondary | ICD-10-CM | POA: Diagnosis not present

## 2022-05-26 DIAGNOSIS — Z79899 Other long term (current) drug therapy: Secondary | ICD-10-CM | POA: Diagnosis not present

## 2022-05-26 DIAGNOSIS — R002 Palpitations: Secondary | ICD-10-CM | POA: Diagnosis not present

## 2022-05-26 NOTE — Progress Notes (Signed)
Clinical Summary Miranda Perkins is a 61 y.o.female seen today for follow up of the following medical problems.      1. SOB/Leg edema - started about 4 months ago - DOE with walking from car to house.  - bilateral swelling legs. +orthopnea - no coughing, no wheezing. Former smoker 30 years.  - has been on HCTZ x 1 month without imprpvement - weight up 8 lbs since 08/2018   07/2018 echo LVEF 93-26%, normal diastolic function - last visit we stopped HCTZ and started lasix '40mg'$  daily. Did not have much weight loss, we increased to '60mg'$  daily. On this regimen dropped from 243 to 239 in about a week. Follow up labs were stable       12/2020 echo: LVEF 65-70%, no WMAs, indet dd, normal RV 12/2020 nuclear stress: no ischemia - former smoker x 20 years. Some ongoing SOB/DOE - 01/2021 PFTs moderate obstruction   - swelling has been well controlled on lasix '60mg'$  daily     2. Palpitations 12/2020 event monitor: short runs of SVT, rare   - no recent  symptoms     3. Chest pain 12/2020 nuclear stress: no ischemia - denies any recent symptoms.          SH: husband is Miranda Perkins   Has  2 39 yo grandchildren (twins) that live with her Miranda Perkins just got accepted to [employer].      Past Medical History:  Diagnosis Date   Arthritis    COPD (chronic obstructive pulmonary disease) ([place])    Edema of both lower extremities    Endometrial polyp    Hirsutism    History of 2019 novel coronavirus disease (COVID-19) 12/21/2020   positive home result documented in pcp note in epic 12-23-2020,  residual doe, pulmonology consult w/ dr wert note in epic 03-20-2021   History of basal cell carcinoma ([place]) excision 2016   forehead, per pt no recurrence   History of CVA (cerebrovascular accident) without residual deficits 1993   pt stated , while on Chloramphenicol, no residuaL; [medication given to treat tick bite] ; patient states  "i stroked out right next to my doctor , he said i passed out and  that was my only symptom" ;  no resiudal   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 Kalya Troeger,  work-up results in epic 03/ 2022 (normal nuclear stress test, normal echo, no arrhythmia's per event monitor);   (05-28-2021 per pt no symptoms since 03/ 2022)   Multiple thyroid nodules    endocrinologist--- dr g. nida,  hx benign bx, clinically euthyroid   Osteopenia 12/2017   T score -1.2 FRAX 4.9% / 0.3%   PMB (postmenopausal bleeding)    Post-COVID chronic dyspnea    Rosacea    Thickened endometrium      Allergies  Allergen Reactions   Influenza Vaccines Anaphylaxis and Other (See Comments)    Per patient   Penicillins Anaphylaxis and Other (See Comments)    PATIENT HAS HAD A PCN REACTION WITH IMMEDIATE RASH, FACIAL/TONGUE/THROAT SWELLING, SOB, OR LIGHTHEADEDNESS WITH HYPOTENSION:  #  #  #  YES  #  #  #   Has patient had a PCN reaction causing severe rash involving mucus membranes or skin necrosis: No PATIENT HAS HAD A PCN REACTION THAT  REQUIRED HOSPITALIZATION:  #  #  #  YES  #  #  #  Has patient had a PCN reaction occurring within the last 10 years: No    Articaine    Cortisone Other (See Comments)    Turned red and ran a low grade fever for 3 days   Other Rash    bandaids- skin turns red and a rash      Current Outpatient Medications  Medication Sig Dispense Refill   albuterol (VENTOLIN HFA) 108 (90 Base) MCG/ACT inhaler Inhale 2 puffs into the lungs every 6 (six) hours as needed. 18 g 2   Cholecalciferol (VITAMIN D3 PO) Take 1 tablet by mouth daily in the afternoon.     Cyanocobalamin (VITAMIN B-12 PO) Take 1 tablet by mouth daily in the afternoon.     doxycycline (MONODOX) 50 MG capsule Take 50 mg by mouth daily as needed (rosacea).     furosemide (LASIX) 40 MG tablet TAKE Miranda Perkins AND Miranda Perkins-HALF TABLETS DAILY 135 tablet 3    ibuprofen (ADVIL) 800 MG tablet Take 800 mg by mouth as needed for mild pain.     meloxicam (MOBIC) 15 MG tablet Take Miranda Perkins pill a day with food as needed. 30 tablet 0   No current facility-administered medications for this visit.     Past Surgical History:  Procedure Laterality Date   ANTERIOR AND POSTERIOR REPAIR N/A 08/21/2020   Procedure: ANTERIOR (CYSTOCELE) AND POSTERIOR REPAIR (RECTOCELE);  Surgeon: Princess Bruins, MD;  Location: [facility];  Service: Gynecology;  Laterality: N/A;  requesting 9:00am OR time  requests Miranda Perkins hour   ANTERIOR CERVICAL DECOMP/DISCECTOMY FUSION N/A 09/27/2017   Procedure: ANTERIOR CERVICAL DECOMPRESSION/DISCECTOMY FUSION CERVICAL FOUR-FIVE ,CERVICAL FIVE-SIX,CERVICAL SIX-SEVEN;  Surgeon: Kary Kos, MD;  Location: [place];  Service: Neurosurgery;  Laterality: N/A;   CARPAL TUNNEL RELEASE Left 09/27/2017   Procedure: CARPAL TUNNEL RELEASE;  Surgeon: Kary Kos, MD;  Location: [place];  Service: Neurosurgery;  Laterality: Left;   DILATATION & CURETTAGE/HYSTEROSCOPY WITH MYOSURE N/A 06/03/2021   Procedure: DILATATION & CURETTAGE/HYSTEROSCOPY WITH MYOSURE;  Surgeon: Princess Bruins, MD;  Location: [place];  Service: Gynecology;  Laterality: N/A;   FOOT SURGERY     bone removed from pinky toe   INGUINAL HERNIA REPAIR Right 1995   KNEE ARTHROSCOPY W/ MENISCAL REPAIR Left 06/21/2017   dr Rushie Nyhan   LEG SURGERY  1972   MOHS SURGERY  2016   [place] of forehead   MOUTH SURGERY  2015   removal and cauterization pallete of cancerous lesion   TONSILLECTOMY  1979   TOTAL KNEE ARTHROPLASTY Left 01/26/2018   Procedure: LEFT TOTAL KNEE ARTHROPLASTY;  Surgeon: Latanya Maudlin, MD;  Location[facility];  Service: Orthopedics;  Laterality: Left;   TUBAL LIGATION       Allergies  Allergen Reactions   Influenza Vaccines Anaphylaxis and Other (See Comments)    Per patient   Penicillins Anaphylaxis and Other (See Comments)    PATIENT HAS HAD  A PCN REACTION WITH IMMEDIATE RASH, FACIAL/TONGUE/THROAT SWELLING, SOB, OR LIGHTHEADEDNESS WITH HYPOTENSION:  #  #  #  YES  #  #  #   Has patient had a PCN reaction causing severe rash involving mucus membranes or skin necrosis: No PATIENT HAS HAD A PCN REACTION THAT REQUIRED HOSPITALIZATION:  #  #  #  YES  #  #  #  Has patient had a PCN reaction occurring within the last 10 years: No  Articaine    Cortisone Other (See Comments)    Turned red and ran a low grade fever for 3 days   Other Rash    bandaids- skin turns red and a rash       Family History  Problem Relation Age of Onset   Rheum arthritis Sister    Clotting disorder Sister    Rheum arthritis Sister    Thyroid disease Sister    Clotting disorder Maternal Aunt    Clotting disorder Maternal Aunt    Heart Problems Mother    Rheum arthritis Mother    Cancer Father        ? type   Cancer Paternal Grandfather        Brain cancer     Social History Ms. Wichert reports that she quit smoking about 12 years ago. Her smoking use included cigarettes. She has a 17.50 pack-year smoking history. She has never used smokeless tobacco. Ms. Smestad reports that she does not currently use alcohol.   Review of Systems CONSTITUTIONAL: No weight loss, fever, chills, weakness or fatigue.  HEENT: Eyes: No visual loss, blurred vision, double vision or yellow sclerae.No hearing loss, sneezing, congestion, runny nose or sore throat.  SKIN: No rash or itching.  CARDIOVASCULAR: per hpi RESPIRATORY: No shortness of breath, cough or sputum.  GASTROINTESTINAL: No anorexia, nausea, vomiting or diarrhea. No abdominal pain or blood.  GENITOURINARY: No burning on urination, no polyuria NEUROLOGICAL: No headache, dizziness, syncope, paralysis, ataxia, numbness or tingling in the extremities. No change in bowel or bladder control.  MUSCULOSKELETAL: No muscle, back pain, joint pain or stiffness.  LYMPHATICS: No enlarged nodes. No history of  splenectomy.  PSYCHIATRIC: No history of depression or anxiety.  ENDOCRINOLOGIC: No reports of sweating, cold or heat intolerance. No polyuria or polydipsia.  [name]   Physical Examination Today's Vitals   05/26/22 1253  BP: 116/80  Pulse: 70  SpO2: 97%  Weight: 250 lb 9.6 oz (113.7 kg)  Height: '5\' 6"'$  (1.676 m)   Body mass index is 40.45 kg/m.  Gen: resting comfortably, no acute distress HEENT: no scleral icterus, pupils equal round and reactive, no palptable cervical adenopathy,  CV: RRR, no m/r/ g no jvd Resp: Clear to auscultation bilaterally GI: abdomen is soft, non-tender, non-distended, normal bowel sounds, no hepatosplenomegaly MSK: extremities are warm, no edema.  Skin: warm, no rash Neuro:  no focal deficits Psych: appropriate affect   Diagnostic Studies 07/2018 echo Study Conclusions   - Left ventricle: The cavity size was normal. Wall thickness was   increased in a pattern of mild LVH. Systolic function was normal.   The estimated ejection fraction was in the range of 60% to 65%.   Wall motion was normal; there were no regional wall motion   abnormalities. Left ventricular diastolic function parameters   were normal. - Mitral valve: There was trivial regurgitation. - Right atrium: Central venous pressure (est): 3 mm Hg. - Atrial septum: No defect or patent foramen ovale was identified. - Tricuspid valve: There was trivial regurgitation. - Pulmonary arteries: Systolic pressure could not be accurately   estimated. - Pericardium, extracardiac: A prominent pericardial fat pad was   present, rule out trivial pericardial effusion.   Impressions:   - A prominent pericardial fat pad was present, rule out trivial   pericardial effusion. If concern remains about epicardial mass, a   chest CT would likely clarify this further.   01/2021 PFTs: moderate obstruction    Assessment and Plan  1. LE edema - well controlled on lasix '60mg'$  daily, repeat bmet/mg       2. Palpitations - fairly benign event monitor, rare ectopy and SVT. - no significant symptoms, continue to monitor   F/u 6 months        Arnoldo Lenis, M.D.

## 2022-05-26 NOTE — Patient Instructions (Addendum)
Medication Instructions:  Continue all current medications.  Labwork: BMET, MG - orders given today Office will contact with results via phone, letter or mychart.     Testing/Procedures: none  Follow-Up: 6 months    Any Other Special Instructions Will Be Listed Below (If Applicable).   If you need a refill on your cardiac medications before your next appointment, please call your pharmacy.

## 2022-06-23 ENCOUNTER — Telehealth: Payer: Self-pay | Admitting: Family Medicine

## 2022-06-23 ENCOUNTER — Other Ambulatory Visit: Payer: Self-pay | Admitting: Family Medicine

## 2022-06-23 DIAGNOSIS — L719 Rosacea, unspecified: Secondary | ICD-10-CM

## 2022-06-23 MED ORDER — DOXYCYCLINE MONOHYDRATE 50 MG PO CAPS: 50.0000 mg | ORAL_CAPSULE | Freq: Every day | ORAL | 0 refills | Status: DC | PRN

## 2022-06-23 NOTE — Telephone Encounter (Signed)
Pt states that Dr. Lajuana Ripple has filled this medication for her in the past for Rosacea. In medications I do not see where Dr. Lajuana Ripple has done so. Will need approval from Dr. Lajuana Ripple for refill. Pt aware that a one month supply may be given until her next appt on 9/26  [place] and then Dr. Evette Doffing took over. When she left Dr Darnell Level then started managing.

## 2022-06-23 NOTE — Telephone Encounter (Signed)
Patient aware and verbalizes understanding. 

## 2022-06-23 NOTE — Telephone Encounter (Signed)
  Prescription Request  06/23/2022   What is the name of the medication or equipment? DOXYCYCLINE  Have you contacted your pharmacy to request a refill? YES  Which pharmacy would you like this sent to? [facility]   Pt scheduled a med refill appt for 07/28/22 (Dr Alver Sorrow first available). Needs meds sent in to last her until her appt. [facility] usually only [facility] 3 month supplies.

## 2022-06-23 NOTE — Telephone Encounter (Signed)
Rx sent 

## 2022-06-25 ENCOUNTER — Ambulatory Visit (INDEPENDENT_AMBULATORY_CARE_PROVIDER_SITE_OTHER): Admitting: Family

## 2022-06-25 ENCOUNTER — Encounter: Payer: Self-pay | Admitting: Family

## 2022-06-25 VITALS — BP 125/82 | HR 77 | Temp 97.8°F | Ht 66.0 in | Wt 247.0 lb

## 2022-06-25 DIAGNOSIS — M7581 Other shoulder lesions, right shoulder: Secondary | ICD-10-CM | POA: Diagnosis not present

## 2022-06-25 DIAGNOSIS — M25511 Pain in right shoulder: Secondary | ICD-10-CM

## 2022-06-25 MED ORDER — PREDNISONE 10 MG (21) PO TBPK: ORAL_TABLET | ORAL | 0 refills | Status: DC

## 2022-06-25 MED ORDER — IBUPROFEN 800 MG PO TABS: 800.0000 mg | ORAL_TABLET | ORAL | 2 refills | Status: DC | PRN

## 2022-06-25 NOTE — Addendum Note (Signed)
Addended by: Evelina Dun A on: 06/25/2022 04:24 PM   Modules accepted: Level of Service

## 2022-06-25 NOTE — Progress Notes (Signed)
Subjective:    Patient ID: Miranda Perkins, female    DOB: 09/30/1961, 61 y.o.   MRN: [id number]  Chief Complaint  Patient presents with   Shoulder Pain    Right shoulder pain no injury    PT presents to the office today with right shoulder pain that started over the last week. States the pain is worse when lifting and and night.   She reports she did a deep clean of her kitchen last week. Shoulder Pain  The pain is present in the right shoulder. This is a new problem. The current episode started 1 to 4 weeks ago. There has been no history of extremity trauma. The problem occurs intermittently. The problem has been waxing and waning. The quality of the pain is described as aching. The pain is at a severity of 2/10. The pain is moderate. Pertinent negatives include no joint locking or joint swelling. The symptoms are aggravated by lying down. She has tried NSAIDS for the symptoms. The treatment provided mild relief.      Review of Systems  All other systems reviewed and are negative.      Objective:   Physical Exam Vitals reviewed.  Constitutional:      General: She is not in acute distress.    Appearance: She is well-developed. She is obese.  HENT:     Head: Normocephalic and atraumatic.     Right Ear: Tympanic membrane normal.     Left Ear: Tympanic membrane normal.  Eyes:     Pupils: Pupils are equal, round, and reactive to light.  Neck:     Thyroid: No thyromegaly.  Cardiovascular:     Rate and Rhythm: Normal rate and regular rhythm.     Heart sounds: Normal heart sounds. No murmur heard. Pulmonary:     Effort: Pulmonary effort is normal. No respiratory distress.     Breath sounds: Normal breath sounds. No wheezing.  Abdominal:     General: Bowel sounds are normal. There is no distension.     Palpations: Abdomen is soft.     Tenderness: There is no abdominal tenderness.  Musculoskeletal:        General: No tenderness. Normal range of motion.     Cervical back:  Normal range of motion and neck supple.  Skin:    General: Skin is warm and dry.  Neurological:     Mental Status: She is alert and oriented to person, place, and time.     Cranial Nerves: No cranial nerve deficit.     Deep Tendon Reflexes: Reflexes are normal and symmetric.  Psychiatric:        Behavior: Behavior normal.        Thought Content: Thought content normal.        Judgment: Judgment normal.      BP 125/82   Pulse 77   Temp 97.8 F (36.6 C) (Temporal)   Ht '5\' 6"'$  (1.676 m)   Wt 274 lb 6.4 oz (124.5 kg)   BMI 44.29 kg/m       Assessment & Plan:  Miranda Perkins comes in today with chief complaint of Shoulder Pain (Right shoulder pain no injury )   Diagnosis and orders addressed:  1. Acute pain of right shoulder  2. Tendinitis of right rotator cuff Rest Ice  ROM exercises encouraged - predniSONE (STERAPRED UNI-PAK 21 TAB) 10 MG (21) TBPK tablet; Use as directed  Dispense: 21 tablet; Refill: 0 - ibuprofen (ADVIL) 800 MG tablet; Take  1 tablet (800 mg total) by mouth as needed for mild pain.  Dispense: 30 tablet; Refill: 2  3. Morbid obesity ([place])      Evelina Dun, FNP

## 2022-06-25 NOTE — Patient Instructions (Signed)
Rotator Cuff Tendinitis  Rotator cuff tendinitis is inflammation of the tendons in the rotator cuff. Tendons are tough, cord-like bands that connect muscle to bone. The rotator cuff includes all of the muscles and tendons that connect the arm to the shoulder. The rotator cuff holds the head of the humerus, or the upper arm bone, in the cup of the shoulder blade (scapula). This condition can lead to a long-term or chronic tear. The tear may be partial or complete. What are the causes? This condition is usually caused by overusing the rotator cuff. What increases the risk? This condition is more likely to develop in athletes and workers who frequently use their shoulder or reach over their heads. This can include activities such as: Tennis. Baseball or softball. Swimming. Construction work. Painting. What are the signs or symptoms? Symptoms of this condition include: Pain that spreads (radiates) from the shoulder to the upper arm. Swelling and tenderness in front of the shoulder. Pain when reaching, pulling, or lifting the arm above the head. Pain when lowering the arm from above the head. Minor pain in the shoulder when resting. Increased pain in the shoulder at night. Difficulty placing the arm behind the back. How is this diagnosed? This condition is diagnosed with a physical exam and medical history. Tests may also be done, including: X-rays. MRI. Ultrasound. CT with or without contrast. How is this treated? Treatment for this condition depends on the severity of the condition. In less severe cases, treatment may include: Rest. This may be done with a sling that holds the shoulder still (immobilization). Your health care provider may also recommend avoiding activities that involve lifting your arm over your head. Icing the shoulder. Anti-inflammatory medicines, such as aspirin or ibuprofen. In more severe cases, treatment may include: Physical therapy. Steroid  injections. Surgery. Follow these instructions at home: If you have a sling: Wear the sling as told by your health care provider. Remove it only as told by your health care provider. Loosen it if your fingers tingle, become numb, or turn cold and blue. Keep it clean. If the sling is not waterproof: Do not let it get wet. Cover it with a watertight covering when you take a bath or shower. Managing pain, stiffness, and swelling  If directed, put ice on the injured area. To do this: If you have a removable sling, remove it as told by your health care provider. Put ice in a plastic bag. Place a towel between your skin and the bag. Leave the ice on for 20 minutes, 2-3 times a day. Move your fingers often to reduce stiffness and swelling. Raise (elevate) the injured area above the level of your heart while you are lying down. Find a comfortable sleeping position, or sleep in a recliner, if available. Activity Rest your shoulder as told by your health care provider. Ask your health care provider when it is safe to drive if you have a sling on your arm. Return to your normal activities as told by your health care provider. Ask your health care provider what activities are safe for you. Do any exercises or stretches as told by your health care provider or physical therapist. If you do repetitive overhead tasks, take small breaks in between and include stretching exercises as told by your health care provider. General instructions Do not use any products that contain nicotine or tobacco, such as cigarettes, e-cigarettes, and chewing tobacco. These can delay healing. If you need help quitting, ask your health care provider.  Take over-the-counter and prescription medicines only as told by your health care provider. Keep all follow-up visits as told by your health care provider. This is important. Contact a health care provider if: Your pain gets worse. You have new pain in your arm, hands, or  fingers. Your pain is not relieved with medicine or does not get better after 6 weeks of treatment. You have crackling sensations when moving your shoulder in certain directions. You hear a snapping sound after using your shoulder, followed by severe pain and weakness. Get help right away if: Your arm, hand, or fingers are numb or tingling. Your arm, hand, or fingers are swollen or painful or they turn white or blue. Summary Rotator cuff tendinitis is inflammation of the tendons in the rotator cuff. Tendons are tough, cord-like bands that connect muscle to bone. This condition is usually caused by overusing the rotator cuff, which includes all of the muscles and tendons that connect the arm to the shoulder. This condition is more likely to develop in athletes and workers who frequently use their shoulder or reach over their heads. Treatment generally includes rest, anti-inflammatory medicines, and icing. In some cases, physical therapy and steroid injections may be needed. In severe cases, surgery may be needed. This information is not intended to replace advice given to you by your health care provider. Make sure you discuss any questions you have with your health care provider. Document Revised: 07/24/2019 Document Reviewed: 07/24/2019 Elsevier Patient Education  [place].

## 2022-06-26 LAB — BASIC METABOLIC PANEL
BUN/Creatinine Ratio: 15 (ref 12–28)
BUN: 12 mg/dL (ref 8–27)
CO2: 23 mmol/L (ref 20–29)
Calcium: 9.7 mg/dL (ref 8.7–10.3)
Chloride: 100 mmol/L (ref 96–106)
Creatinine, Ser: 0.78 mg/dL (ref 0.57–1.00)
Glucose: 93 mg/dL (ref 70–99)
Potassium: 4.1 mmol/L (ref 3.5–5.2)
Sodium: 142 mmol/L (ref 134–144)
eGFR: 85 mL/min/{1.73_m2} (ref >59)

## 2022-06-26 LAB — MAGNESIUM: Magnesium: 2.3 mg/dL (ref 1.6–2.3)

## 2022-06-30 ENCOUNTER — Ambulatory Visit: Admitting: "Endocrinology

## 2022-07-21 ENCOUNTER — Other Ambulatory Visit: Payer: Self-pay | Admitting: Family Medicine

## 2022-07-21 DIAGNOSIS — L719 Rosacea, unspecified: Secondary | ICD-10-CM

## 2022-07-21 NOTE — Telephone Encounter (Signed)
Please verify patient wants this refilled. She doesn't use it often

## 2022-07-28 ENCOUNTER — Ambulatory Visit: Admitting: Family Medicine

## 2022-07-28 ENCOUNTER — Encounter: Payer: Self-pay | Admitting: Family Medicine

## 2022-07-28 ENCOUNTER — Ambulatory Visit (INDEPENDENT_AMBULATORY_CARE_PROVIDER_SITE_OTHER)

## 2022-07-28 VITALS — BP 122/80 | HR 77 | Temp 98.4°F | Ht 66.0 in | Wt 251.0 lb

## 2022-07-28 DIAGNOSIS — G5601 Carpal tunnel syndrome, right upper limb: Secondary | ICD-10-CM

## 2022-07-28 DIAGNOSIS — L719 Rosacea, unspecified: Secondary | ICD-10-CM

## 2022-07-28 DIAGNOSIS — M25511 Pain in right shoulder: Secondary | ICD-10-CM

## 2022-07-28 DIAGNOSIS — R232 Flushing: Secondary | ICD-10-CM

## 2022-07-28 DIAGNOSIS — E042 Nontoxic multinodular goiter: Secondary | ICD-10-CM

## 2022-07-28 LAB — BAYER DCA HB A1C WAIVED: HB A1C (BAYER DCA - WAIVED): 5.9 % — ABNORMAL HIGH (ref 4.8–5.6)

## 2022-07-28 MED ORDER — METRONIDAZOLE 1 % EX GEL: Freq: Every day | CUTANEOUS | 3 refills | Status: DC

## 2022-07-28 NOTE — Progress Notes (Signed)
Subjective: CC: Multiple issues PCP: Miranda Norlander, DO Miranda Perkins is a 61 y.o. female presenting to clinic today for:  1.  Carpal tunnel Patient reports that she had issues with carpal tunnel in bilateral wrist.  She has history of carpal tunnel surgery on the left I was expected to get carpal tunnel surgery on the right with Dr. Saintclair Halsted pre-COVID but this was delayed due to [place].  She continues to have issues in that right hand and would like to get a referral back to his office for surgery  2.  Rosacea Patient reports that the doxycycline that used to work within a few days now takes longer longer to work.  She is interested in going on a topical gel.  Her daughter is treated with the gel and this seems to work really well for her.  3.  Right shoulder pain Patient has had ongoing right shoulder pain.  She was seen in the office about a month ago by one of the nurse practitioners here.  It was thought that she likely had tendinitis.  She notes pain with certain positions, as she cannot lay on that side due to the pain.  She has carpal tunnel in her right hand but does not report any new radicular symptoms.  No preceding injury.  Cannot take oral prednisone due to side effects and cannot tolerate corticosteroid injection as well.  Is not in physical therapy but would be willing to go to physical therapy.  4.  Hot flashes Patient would like to get her hormones checked because she has hot flashes.  She notes that this is intermittent.  She is status post menopause for many years.  No abnormal vaginal bleeding.  Has history of multinodular goiter and was followed by Dr. Dorris Fetch in [place] but would like to establish with a new endocrinologist as she did not feel that she connected with that endocrinologist.  She would not want to go on any hormones even if her hormones look low but wants to know why she has been feeling like she does   ROS: Per HPI  Allergies  Allergen Reactions    Influenza Vaccines Anaphylaxis and Other (See Comments)    Per patient   Penicillins Anaphylaxis and Other (See Comments)    PATIENT HAS HAD A PCN REACTION WITH IMMEDIATE RASH, FACIAL/TONGUE/THROAT SWELLING, SOB, OR LIGHTHEADEDNESS WITH HYPOTENSION:  #  #  #  YES  #  #  #   Has patient had a PCN reaction causing severe rash involving mucus membranes or skin necrosis: No PATIENT HAS HAD A PCN REACTION THAT REQUIRED HOSPITALIZATION:  #  #  #  YES  #  #  #  Has patient had a PCN reaction occurring within the last 10 years: No    Articaine    Cortisone Other (See Comments)    Turned red and ran a low grade fever for 3 days   Other Rash    bandaids- skin turns red and a rash    Past Medical History:  Diagnosis Date   Arthritis    COPD (chronic obstructive pulmonary disease) ([place])    Edema of both lower extremities    Endometrial polyp    Hirsutism    History of 2019 novel coronavirus disease (COVID-19) 12/21/2020   positive home result documented in pcp note in epic 12-23-2020,  residual doe, pulmonology consult w/ dr wert note in epic 03-20-2021   History of basal cell carcinoma ([place]) excision 2016  forehead, per pt no recurrence   History of CVA (cerebrovascular accident) without residual deficits 1993   pt stated , while on Chloramphenicol, no residuaL; [medication given to treat tick bite] ; patient states  "i stroked out right next to my doctor , he said i passed out and that was my only symptom" ;  no resiudal   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 branch,  work-up results in epic 03/ 2022 (normal nuclear stress test, normal echo, no arrhythmia's per event monitor);   (05-28-2021 per pt no symptoms since 03/ 2022)   Multiple thyroid nodules    endocrinologist--- dr g. nida,  hx benign bx, clinically euthyroid   Osteopenia  12/2017   T score -1.2 FRAX 4.9% / 0.3%   PMB (postmenopausal bleeding)    Post-COVID chronic dyspnea    Rosacea    Thickened endometrium     Current Outpatient Medications:    albuterol (VENTOLIN HFA) 108 (90 Base) MCG/ACT inhaler, Inhale 2 puffs into the lungs every 6 (six) hours as needed., Disp: 18 g, Rfl: 2   Cholecalciferol (VITAMIN D3 PO), Take 1 tablet by mouth daily in the afternoon., Disp: , Rfl:    Cyanocobalamin (VITAMIN B-12 PO), Take 1 tablet by mouth daily in the afternoon., Disp: , Rfl:    doxycycline (MONODOX) 50 MG capsule, Take 1 capsule (50 mg total) by mouth daily as needed (rosacea)., Disp: 30 capsule, Rfl: 0   furosemide (LASIX) 40 MG tablet, TAKE ONE AND ONE-HALF TABLETS DAILY, Disp: 135 tablet, Rfl: 3   ibuprofen (ADVIL) 800 MG tablet, Take 1 tablet (800 mg total) by mouth as needed for mild pain., Disp: 30 tablet, Rfl: 2 Social History   Socioeconomic History   Marital status: Married    Spouse name: Not on file   Number of children: 6   Years of education: Not on file   Highest education level: Not on file  Occupational History   Occupation: retired    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 Raising 2 grandchildren of deceased daughter.       Lives in one story ranch house.    Social Determinants of Health   Financial Resource Strain: Not on file  Food Insecurity: Not on file   Transportation Needs: Not on file  Physical Activity: Not on file  Stress: Not on file  Social Connections: Not on file  Intimate Partner Violence: Not on file   Family History  Problem Relation Age of Onset   Rheum arthritis Sister    Clotting disorder Sister    Rheum arthritis Sister    Thyroid  disease Sister    Clotting disorder Maternal Aunt    Clotting disorder Maternal Aunt    Heart Problems Mother    Rheum arthritis Mother    Cancer Father        ? type   Cancer Paternal Grandfather        Brain cancer    Objective: Office vital signs reviewed. BP 122/80   Pulse 77   Temp 98.4 F (36.9 C)   Ht $R'5\' 6"'xT$  (1.676 m)   Wt 251 lb (113.9 kg)   SpO2 96%   BMI 40.51 kg/m   Physical Examination:  General: Awake, alert, morbidly obese, No acute distress HEENT: No exophthalmos    Neck: Goiter present Cardio: regular rate and rhythm, S1S2 heard, no murmurs appreciated Pulm: clear to auscultation bilaterally, no wheezes, rhonchi or rales; normal work of breathing on room air Extremities: warm, well perfused, No edema, cyanosis or clubbing; +2 pulses bilaterally MSK: normal gait and station  Right shoulder: Range of motion fairly preserved.  She has point tenderness over the [facility] joint.  Negative empty can test. Skin: dry; intact; no active flareup of rosacea appreciated on face Neuro: no tremor  Assessment/ Plan: 61 y.o. female   Rosacea - Plan: metroNIDAZOLE (METROGEL) 1 % gel, CBC  Morbid obesity (HCC) - Plan: Bayer DCA Hb A1c Waived  Carpal tunnel syndrome of right wrist - Plan: Ambulatory referral to Neurosurgery  Arthralgia of right acromioclavicular joint - Plan: Ambulatory referral to Physical Therapy, DG Shoulder Right, CBC  Nontoxic multinodular goiter - Plan: Ambulatory referral to Endocrinology, TSH, T4, Free  Hot flashes - Plan: FSH/LH, Estrogens, Total, TSH, T4, Free, CMP14+EGFR, CBC  We will trial of MetroGel.  Okay to use doxycycline as needed.  Non  fasting labs obtained today..  Check A1c given morbid obesity  Referral back to Dr. Saintclair Halsted for the right carpal tunnel surgery that was planned prepandemic.  Her physical exam was remarkable for tenderness over the [facility] joint.  This certainly could be precipitating some tendinopathy.  Unfortunately she cannot tolerate corticosteroids so no injection therapy will be an option at this point but I certainly think that physical therapy is worth pursuing.  This has been ordered and she will start applying Voltaren gel to the affected areas up to 4 times daily as needed.  Referral to endocrinology in [place] for second opinion.  Check TSH, free T4 given reports of hot flashes.  She also requested that hormone testing be done.  Since she is postmenopausal we discussed that this will likely be of little benefit but she still wanted to have them done.  May need to consider something like Benjamine Mola is up for treatment of hot flashes given postmenopausal state and not wanting to be on hormones  No orders of the defined types were placed in this encounter.  No orders of the defined types were placed in this encounter.    Miranda Norlander, DO [place] [phone]

## 2022-07-31 LAB — T4, FREE: Free T4: 1.15 ng/dL (ref 0.82–1.77)

## 2022-07-31 LAB — CMP14+EGFR
ALT: 23 IU/L (ref 0–32)
AST: 13 IU/L (ref 0–40)
Albumin/Globulin Ratio: 2.3 — ABNORMAL HIGH (ref 1.2–2.2)
Albumin: 4.6 g/dL (ref 3.9–4.9)
Alkaline Phosphatase: 80 IU/L (ref 44–121)
BUN/Creatinine Ratio: 14 (ref 12–28)
BUN: 13 mg/dL (ref 8–27)
Bilirubin Total: 0.4 mg/dL (ref 0.0–1.2)
CO2: 22 mmol/L (ref 20–29)
Calcium: 9.3 mg/dL (ref 8.7–10.3)
Chloride: 101 mmol/L (ref 96–106)
Creatinine, Ser: 0.92 mg/dL (ref 0.57–1.00)
Globulin, Total: 2 g/dL (ref 1.5–4.5)
Glucose: 120 mg/dL — ABNORMAL HIGH (ref 70–99)
Potassium: 4 mmol/L (ref 3.5–5.2)
Sodium: 140 mmol/L (ref 134–144)
Total Protein: 6.6 g/dL (ref 6.0–8.5)
eGFR: 70 mL/min/{1.73_m2} (ref >59)

## 2022-07-31 LAB — CBC
Hematocrit: 42.1 % (ref 34.0–46.6)
Hemoglobin: 14.2 g/dL (ref 11.1–15.9)
MCH: 29.3 pg (ref 26.6–33.0)
MCHC: 33.7 g/dL (ref 31.5–35.7)
MCV: 87 fL (ref 79–97)
Platelets: 291 10*3/uL (ref 150–450)
RBC: 4.84 x10E6/uL (ref 3.77–5.28)
RDW: 13.5 % (ref 11.7–15.4)
WBC: 7.1 10*3/uL (ref 3.4–10.8)

## 2022-07-31 LAB — FSH/LH
FSH: 47.8 m[IU]/mL
LH: 35 m[IU]/mL

## 2022-07-31 LAB — ESTROGENS, TOTAL: Estrogen: 145 pg/mL (ref 40–244)

## 2022-07-31 LAB — TSH: TSH: 1.68 u[IU]/mL (ref 0.450–4.500)

## 2022-08-03 ENCOUNTER — Ambulatory Visit (INDEPENDENT_AMBULATORY_CARE_PROVIDER_SITE_OTHER): Admitting: Family Medicine

## 2022-08-03 ENCOUNTER — Encounter: Payer: Self-pay | Admitting: Family Medicine

## 2022-08-03 ENCOUNTER — Other Ambulatory Visit: Payer: Self-pay | Admitting: *Deleted

## 2022-08-03 DIAGNOSIS — U071 COVID-19: Secondary | ICD-10-CM

## 2022-08-03 MED ORDER — NIRMATRELVIR/RITONAVIR (PAXLOVID)TABLET: 3.0000 | ORAL_TABLET | Freq: Two times a day (BID) | ORAL | 0 refills | Status: DC

## 2022-08-03 MED ORDER — NIRMATRELVIR/RITONAVIR (PAXLOVID)TABLET: 3.0000 | ORAL_TABLET | Freq: Two times a day (BID) | ORAL | 0 refills | Status: AC

## 2022-08-03 MED ORDER — DEXAMETHASONE 6 MG PO TABS: 6.0000 mg | ORAL_TABLET | Freq: Every day | ORAL | 0 refills | Status: AC

## 2022-08-03 NOTE — Progress Notes (Signed)
Virtual Visit via Telephone Note  I connected with Miranda Perkins on 08/03/22 at 8:54 AM by telephone and verified that I am speaking with the correct person using two identifiers. Miranda Perkins is currently located at home and her husband is currently with her during this visit. The provider, Loman Brooklyn, FNP is located in their office at time of visit.  I discussed the limitations, risks, security and privacy concerns of performing an evaluation and management service by telephone and the availability of in person appointments. I also discussed with the patient that there may be a patient responsible charge related to this service. The patient expressed understanding and agreed to proceed.  Subjective: PCP: [name], DO  Chief Complaint  Patient presents with   Covid Positive   Patient complains of cough, sneezing, facial pain/pressure, fever, postnasal drainage, and heaviness in lungs . Onset of symptoms was 1 day ago, gradually worsening since that time. She is drinking moderate amounts of fluids. Evaluation to date: at home COVID test positive this morning. Treatment to date:  Mucinex and Tylenol .  She does not smoke.  Her husband tested positive for COVID-19 three days ago.   ROS: Per HPI  Current Outpatient Medications:    albuterol (VENTOLIN HFA) 108 (90 Base) MCG/ACT inhaler, Inhale 2 puffs into the lungs every 6 (six) hours as needed., Disp: 18 g, Rfl: 2   Cholecalciferol (VITAMIN D3 PO), Take 1 tablet by mouth daily in the afternoon., Disp: , Rfl:    Cyanocobalamin (VITAMIN B-12 PO), Take 1 tablet by mouth daily in the afternoon., Disp: , Rfl:    doxycycline (MONODOX) 50 MG capsule, Take 1 capsule (50 mg total) by mouth daily as needed (rosacea)., Disp: 30 capsule, Rfl: 0   furosemide (LASIX) 40 MG tablet, TAKE ONE AND ONE-HALF TABLETS DAILY, Disp: 135 tablet, Rfl: 3   ibuprofen (ADVIL) 800 MG tablet, Take 1 tablet (800 mg total) by mouth as needed for  mild pain., Disp: 30 tablet, Rfl: 2   metroNIDAZOLE (METROGEL) 1 % gel, Apply topically daily., Disp: 45 g, Rfl: 3  Allergies  Allergen Reactions   Influenza Vaccines Anaphylaxis and Other (See Comments)    Per patient   Penicillins Anaphylaxis and Other (See Comments)    PATIENT HAS HAD A PCN REACTION WITH IMMEDIATE RASH, FACIAL/TONGUE/THROAT SWELLING, SOB, OR LIGHTHEADEDNESS WITH HYPOTENSION:  #  #  #  YES  #  #  #   Has patient had a PCN reaction causing severe rash involving mucus membranes or skin necrosis: No PATIENT HAS HAD A PCN REACTION THAT REQUIRED HOSPITALIZATION:  #  #  #  YES  #  #  #  Has patient had a PCN reaction occurring within the last 10 years: No    Articaine    Cortisone Other (See Comments)    Turned red and ran a low grade fever for 3 days   Other Rash    bandaids- skin turns red and a rash    Past Medical History:  Diagnosis Date   Arthritis    COPD (chronic obstructive pulmonary disease) (HCC)    Edema of both lower extremities    Endometrial polyp    Hirsutism    History of 2019 novel coronavirus disease (COVID-19) 12/21/2020   positive home result documented in pcp note in epic 12-23-2020,  residual doe, pulmonology consult w/ dr wert note in epic 03-20-2021   History of basal cell carcinoma ([place]) excision 2016  forehead, per pt no recurrence   History of CVA (cerebrovascular accident) without residual deficits 1993   pt stated , while on Chloramphenicol, no residuaL; [medication given to treat tick bite] ; patient states  "i stroked out right next to my doctor , he said i passed out and that was my only symptom" ;  no resiudal   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 branch,  work-up results in epic 03/ 2022 (normal nuclear stress test, normal echo, no arrhythmia's per event monitor);   (05-28-2021  per pt no symptoms since 03/ 2022)   Multiple thyroid nodules    endocrinologist--- dr g. nida,  hx benign bx, clinically euthyroid   Osteopenia 12/2017   T score -1.2 FRAX 4.9% / 0.3%   PMB (postmenopausal bleeding)    Post-COVID chronic dyspnea    Rosacea    Thickened endometrium     Observations/Objective: A&O  No respiratory distress or wheezing audible over the phone Mood, judgement, and thought processes all WNL  Assessment and Plan: 1. COVID-19 Encouraged symptom management and adequate hydration.  - nirmatrelvir/ritonavir EUA (PAXLOVID) 20 x 150 MG & 10 x '100MG'$  TABS; Take 3 tablets by mouth 2 (two) times daily for 5 days. (Take nirmatrelvir 150 mg two tablets twice daily for 5 days and ritonavir 100 mg one tablet twice daily for 5 days) Patient GFR is 70  Dispense: 30 tablet; Refill: 0 - dexamethasone (DECADRON) 6 MG tablet; Take 1 tablet (6 mg total) by mouth daily for 5 days.  Dispense: 5 tablet; Refill: 0   Follow Up Instructions:  I discussed the assessment and treatment plan with the patient. The patient was provided an opportunity to ask questions and all were answered. The patient agreed with the plan and demonstrated an understanding of the instructions.   The patient was advised to call back or seek an in-person evaluation if the symptoms worsen or if the condition fails to improve as anticipated.  The above assessment and management plan was discussed with the patient. The patient verbalized understanding of and has agreed to the management plan. Patient is aware to call the clinic if symptoms persist or worsen. Patient is aware when to return to the clinic for a follow-up visit. Patient educated on when it is appropriate to go to the emergency department.   Time call ended: 9:05 AM  I provided 11 minutes of non-face-to-face time during this encounter.  Hendricks Limes, MSN, APRN, FNP-C [facility] 08/03/22

## 2022-08-13 ENCOUNTER — Telehealth: Payer: Self-pay | Admitting: Family Medicine

## 2022-08-13 DIAGNOSIS — L719 Rosacea, unspecified: Secondary | ICD-10-CM

## 2022-08-14 ENCOUNTER — Other Ambulatory Visit (HOSPITAL_COMMUNITY): Payer: Self-pay

## 2022-08-14 MED ORDER — DOXYCYCLINE MONOHYDRATE 50 MG PO CAPS
50.0000 mg | ORAL_CAPSULE | Freq: Every day | ORAL | 0 refills | Status: DC | PRN
  Filled 2022-08-14: qty 30, 30d supply, fill #0

## 2022-08-14 MED ORDER — DOXYCYCLINE MONOHYDRATE 50 MG PO CAPS: 50.0000 mg | ORAL_CAPSULE | Freq: Every day | ORAL | 0 refills | Status: DC | PRN

## 2022-08-14 NOTE — Addendum Note (Signed)
Addended by: Michaela Corner on: 08/14/2022 11:20 AM   Modules accepted: Orders

## 2022-08-14 NOTE — Telephone Encounter (Signed)
Dr. Lajuana Ripple had sent in Doxycycline prescription to [facility].  I called them and cancelled it and resent it to [facility], patient informed.

## 2022-08-14 NOTE — Telephone Encounter (Signed)
Pt needs for this rx to go [facility]

## 2022-08-19 ENCOUNTER — Other Ambulatory Visit: Payer: Self-pay

## 2022-08-19 ENCOUNTER — Telehealth: Payer: Self-pay | Admitting: Family Medicine

## 2022-08-19 ENCOUNTER — Other Ambulatory Visit: Payer: Self-pay | Admitting: Family Medicine

## 2022-08-19 DIAGNOSIS — M7581 Other shoulder lesions, right shoulder: Secondary | ICD-10-CM

## 2022-08-19 DIAGNOSIS — L719 Rosacea, unspecified: Secondary | ICD-10-CM

## 2022-08-19 MED ORDER — IBUPROFEN 800 MG PO TABS: 800.0000 mg | ORAL_TABLET | Freq: Two times a day (BID) | ORAL | 3 refills | Status: DC | PRN

## 2022-08-19 MED ORDER — DOXYCYCLINE HYCLATE 50 MG PO CAPS: 50.0000 mg | ORAL_CAPSULE | Freq: Every day | ORAL | 3 refills | Status: DC | PRN

## 2022-08-19 NOTE — Telephone Encounter (Signed)
Last office visit 07/28/22 Last refill 06/25/22, #30, 2 refills

## 2022-08-19 NOTE — Telephone Encounter (Signed)
Her last refill request note: "   - doxycycline (MONODOX) 50 MG capsule   If you have any questions about your prescription, please send a message to your doctor."  I have sent in the vibramycin instead.  Please make sure she selects that from her med list history in the future to avoid confusion.

## 2022-08-19 NOTE — Telephone Encounter (Signed)
Pt says that doxycycline (MONODOX) 50 MG capsule rx is wrong. Pt takes HYCLATE '50MG'$ . Send to [facility]. Please fix and call back when done.

## 2022-08-19 NOTE — Telephone Encounter (Signed)
Pt aware by vm. 

## 2022-08-21 ENCOUNTER — Other Ambulatory Visit (HOSPITAL_COMMUNITY): Payer: Self-pay

## 2022-08-24 ENCOUNTER — Telehealth: Payer: Self-pay | Admitting: Cardiology

## 2022-08-24 MED ORDER — FUROSEMIDE 40 MG PO TABS: 60.0000 mg | ORAL_TABLET | Freq: Every day | ORAL | 3 refills | Status: DC

## 2022-08-24 NOTE — Telephone Encounter (Signed)
*  STAT* If patient is at the pharmacy, call can be transferred to refill team.   1. Which medications need to be refilled? (please list name of each medication and dose if known) furosemide (LASIX) 40 MG tablet  2. Which pharmacy/location (including street and city if local pharmacy) is medication to be sent to? [facility], [place]  3. Do they need a 30 day or 90 day supply? [place]

## 2022-11-10 ENCOUNTER — Ambulatory Visit: Attending: Cardiology | Admitting: Cardiology

## 2022-11-10 ENCOUNTER — Encounter: Payer: Self-pay | Admitting: Cardiology

## 2022-11-10 VITALS — BP 114/88 | HR 72 | Ht 66.0 in | Wt 252.0 lb

## 2022-11-10 DIAGNOSIS — R002 Palpitations: Secondary | ICD-10-CM

## 2022-11-10 DIAGNOSIS — R600 Localized edema: Secondary | ICD-10-CM | POA: Diagnosis not present

## 2022-11-10 NOTE — Progress Notes (Signed)
Clinical Summary Ms. Luevanos is a 62 y.o.female seen today for follow up of the following medical problems.      1. SOB/Leg edema 07/2018 echo LVEF 35-00%, normal diastolic function 07/3817 echo: LVEF 65-70%, no WMAs, indet dd, normal RV 12/2020 nuclear stress: no ischemia - former smoker x 20 years. Some ongoing SOB/DOE - 01/2021 PFTs moderate obstruction   - edema is controlled with diuretic. No recent SOB/DOE       2. Palpitations 12/2020 event monitor: short runs of SVT, rare   - mild palpitations overall tolerable     3. Chest pain 12/2020 nuclear stress: no ischemia - no recnt symptoms     SH: husband is Jacquline Terrill   Has  2 5 yo grandchildren (twins) that live with her One started [employer], interested in environmental biology   Past Medical History:  Diagnosis Date   Arthritis    Edema of both lower extremities    Endometrial polyp    Hirsutism    History of 2019 novel coronavirus disease (COVID-19) 12/21/2020   positive home result documented in pcp note in epic 12-23-2020,  residual doe, pulmonology consult w/ dr wert note in epic 03-20-2021   History of basal cell carcinoma ([place]) excision 2016   forehead, per pt no recurrence   History of CVA (cerebrovascular accident) without residual deficits 1993   pt stated , while on Chloramphenicol, no residuaL; [medication given to treat tick bite] ; patient states  "i stroked out right next to my doctor , he said i passed out and that was my only symptom" ;  no resiudal   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 Trenia Tennyson,  work-up results in epic 03/ 2022 (normal nuclear stress test, normal echo, no arrhythmia's per event monitor);   (05-28-2021 per pt no symptoms since 03/ 2022)   Multiple thyroid nodules    endocrinologist--- dr g. nida,  hx benign bx,  clinically euthyroid   Osteopenia 12/2017   T score -1.2 FRAX 4.9% / 0.3%   PMB (postmenopausal bleeding)    Post-COVID chronic dyspnea    Rosacea    Thickened endometrium      Allergies  Allergen Reactions   Influenza Vaccines Anaphylaxis and Other (See Comments)    Per patient   Penicillins Anaphylaxis and Other (See Comments)    PATIENT HAS HAD A PCN REACTION WITH IMMEDIATE RASH, FACIAL/TONGUE/THROAT SWELLING, SOB, OR LIGHTHEADEDNESS WITH HYPOTENSION:  #  #  #  YES  #  #  #   Has patient had a PCN reaction causing severe rash involving mucus membranes or skin necrosis: No PATIENT HAS HAD A PCN REACTION THAT REQUIRED HOSPITALIZATION:  #  #  #  YES  #  #  #  Has patient had a PCN reaction occurring within the last 10 years: No    Articaine    Cortisone Other (See Comments)    Turned red and ran a low grade fever for 3 days   Other Rash    bandaids- skin turns red and a rash      Current Outpatient Medications  Medication Sig Dispense Refill   albuterol (VENTOLIN HFA) 108 (90 Base) MCG/ACT inhaler Inhale 2 puffs into the lungs every 6 (six) hours as needed. 18 g 2  Cholecalciferol (VITAMIN D3 PO) Take 1 tablet by mouth daily in the afternoon.     Cyanocobalamin (VITAMIN B-12 PO) Take 1 tablet by mouth daily in the afternoon.     doxycycline (VIBRAMYCIN) 50 MG capsule Take 1 capsule (50 mg total) by mouth daily as needed (rosacea flare up). 90 capsule 3   furosemide (LASIX) 40 MG tablet Take 1.5 tablets (60 mg total) by mouth daily. 135 tablet 3   ibuprofen (ADVIL) 800 MG tablet Take 1 tablet (800 mg total) by mouth 2 (two) times daily as needed for moderate pain. 90 tablet 3   metroNIDAZOLE (METROGEL) 1 % gel Apply topically daily. 45 g 3   No current facility-administered medications for this visit.     Past Surgical History:  Procedure Laterality Date   ANTERIOR AND POSTERIOR REPAIR N/A 08/21/2020   Procedure: ANTERIOR (CYSTOCELE) AND POSTERIOR REPAIR (RECTOCELE);   Surgeon: Princess Bruins, MD;  Location: [facility];  Service: Gynecology;  Laterality: N/A;  requesting 9:00am OR time  requests one hour   ANTERIOR CERVICAL DECOMP/DISCECTOMY FUSION N/A 09/27/2017   Procedure: ANTERIOR CERVICAL DECOMPRESSION/DISCECTOMY FUSION CERVICAL FOUR-FIVE ,CERVICAL FIVE-SIX,CERVICAL SIX-SEVEN;  Surgeon: Kary Kos, MD;  Location: [place];  Service: Neurosurgery;  Laterality: N/A;   CARPAL TUNNEL RELEASE Left 09/27/2017   Procedure: CARPAL TUNNEL RELEASE;  Surgeon: Kary Kos, MD;  Location: [place];  Service: Neurosurgery;  Laterality: Left;   DILATATION & CURETTAGE/HYSTEROSCOPY WITH MYOSURE N/A 06/03/2021   Procedure: DILATATION & CURETTAGE/HYSTEROSCOPY WITH MYOSURE;  Surgeon: Princess Bruins, MD;  Location: [facility];  Service: Gynecology;  Laterality: N/A;   FOOT SURGERY     bone removed from pinky toe   INGUINAL HERNIA REPAIR Right 1995   KNEE ARTHROSCOPY W/ MENISCAL REPAIR Left 06/21/2017   dr Rushie Nyhan   LEG SURGERY  1972   MOHS SURGERY  2016   [place] of forehead   MOUTH SURGERY  2015   removal and cauterization pallete of cancerous lesion   TONSILLECTOMY  1979   TOTAL KNEE ARTHROPLASTY Left 01/26/2018   Procedure: LEFT TOTAL KNEE ARTHROPLASTY;  Surgeon: Latanya Maudlin, MD;  Location: [facility];  Service: Orthopedics;  Laterality: Left;   TUBAL LIGATION       Allergies  Allergen Reactions   Influenza Vaccines Anaphylaxis and Other (See Comments)    Per patient   Penicillins Anaphylaxis and Other (See Comments)    PATIENT HAS HAD A PCN REACTION WITH IMMEDIATE RASH, FACIAL/TONGUE/THROAT SWELLING, SOB, OR LIGHTHEADEDNESS WITH HYPOTENSION:  #  #  #  YES  #  #  #   Has patient had a PCN reaction causing severe rash involving mucus membranes or skin necrosis: No PATIENT HAS HAD A PCN REACTION THAT REQUIRED HOSPITALIZATION:  #  #  #  YES  #  #  #  Has patient had a PCN reaction occurring within the last 10 years: No    Articaine     Cortisone Other (See Comments)    Turned red and ran a low grade fever for 3 days   Other Rash    bandaids- skin turns red and a rash       Family History  Problem Relation Age of Onset   Rheum arthritis Sister    Clotting disorder Sister    Rheum arthritis Sister    Thyroid disease Sister    Clotting disorder Maternal Aunt    Clotting disorder Maternal Aunt    Heart Problems Mother    Rheum  arthritis Mother    Cancer Father        ? type   Cancer Paternal Grandfather        Brain cancer     Social History Ms. Blalock reports that she quit smoking about 12 years ago. Her smoking use included cigarettes. She has a 17.50 pack-year smoking history. She has never used smokeless tobacco. Ms. Bowens reports that she does not currently use alcohol.   Review of Systems CONSTITUTIONAL: No weight loss, fever, chills, weakness or fatigue.  HEENT: Eyes: No visual loss, blurred vision, double vision or yellow sclerae.No hearing loss, sneezing, congestion, runny nose or sore throat.  SKIN: No rash or itching.  CARDIOVASCULAR: per hpi RESPIRATORY: No shortness of breath, cough or sputum.  GASTROINTESTINAL: No anorexia, nausea, vomiting or diarrhea. No abdominal pain or blood.  GENITOURINARY: No burning on urination, no polyuria NEUROLOGICAL: No headache, dizziness, syncope, paralysis, ataxia, numbness or tingling in the extremities. No change in bowel or bladder control.  MUSCULOSKELETAL: No muscle, back pain, joint pain or stiffness.  LYMPHATICS: No enlarged nodes. No history of splenectomy.  PSYCHIATRIC: No history of depression or anxiety.  ENDOCRINOLOGIC: No reports of sweating, cold or heat intolerance. No polyuria or polydipsia.  [name]   Physical Examination Today's Vitals   11/10/22 1050  BP: 114/88  Pulse: 72  SpO2: 97%  Weight: 252 lb (114.3 kg)  Height: '5\' 6"'$  (1.676 m)   Body mass index is 40.67 kg/m.  Gen: resting comfortably, no acute distress HEENT: no scleral  icterus, pupils equal round and reactive, no palptable cervical adenopathy,  CV: RRR, no m/r/g no jvd Resp: Clear to auscultation bilaterally GI: abdomen is soft, non-tender, non-distended, normal bowel sounds, no hepatosplenomegaly MSK: extremities are warm, no edema.  Skin: warm, no rash Neuro:  no focal deficits Psych: appropriate affect   Diagnostic Studies  07/2018 echo Study Conclusions   - Left ventricle: The cavity size was normal. Wall thickness was   increased in a pattern of mild LVH. Systolic function was normal.   The estimated ejection fraction was in the range of 60% to 65%.   Wall motion was normal; there were no regional wall motion   abnormalities. Left ventricular diastolic function parameters   were normal. - Mitral valve: There was trivial regurgitation. - Right atrium: Central venous pressure (est): 3 mm Hg. - Atrial septum: No defect or patent foramen ovale was identified. - Tricuspid valve: There was trivial regurgitation. - Pulmonary arteries: Systolic pressure could not be accurately   estimated. - Pericardium, extracardiac: A prominent pericardial fat pad was   present, rule out trivial pericardial effusion.   Impressions:   - A prominent pericardial fat pad was present, rule out trivial   pericardial effusion. If concern remains about epicardial mass, a   chest CT would likely clarify this further.   01/2021 PFTs: moderate obstruction     Assessment and Plan   1. LE edema - doing well, continue so to be controlled with lasix at current dosing. Continue current meds       2. Palpitations - fairly benign event monitor, rare ectopy and SVT. - no significant symptoms, we will continue to monitor      Arnoldo Lenis, M.D.

## 2022-11-10 NOTE — Patient Instructions (Signed)

## 2023-01-06 ENCOUNTER — Telehealth: Admitting: Family Medicine

## 2023-01-06 ENCOUNTER — Encounter: Payer: Self-pay | Admitting: Family Medicine

## 2023-01-06 DIAGNOSIS — R0602 Shortness of breath: Secondary | ICD-10-CM

## 2023-01-06 DIAGNOSIS — R509 Fever, unspecified: Secondary | ICD-10-CM

## 2023-01-06 DIAGNOSIS — J069 Acute upper respiratory infection, unspecified: Secondary | ICD-10-CM

## 2023-01-06 MED ORDER — DOXYCYCLINE HYCLATE 100 MG PO TABS: 100.0000 mg | ORAL_TABLET | Freq: Two times a day (BID) | ORAL | 0 refills | Status: AC

## 2023-01-06 MED ORDER — ALBUTEROL SULFATE HFA 108 (90 BASE) MCG/ACT IN AERS: 2.0000 | INHALATION_SPRAY | Freq: Four times a day (QID) | RESPIRATORY_TRACT | 2 refills | Status: DC | PRN

## 2023-01-06 NOTE — Progress Notes (Signed)
Virtual Visit via telephone Note Due to COVID-19 pandemic this visit was conducted virtually. This visit type was conducted due to national recommendations for restrictions regarding the COVID-19 Pandemic (e.g. social distancing, sheltering in place) in an effort to limit this patient's exposure and mitigate transmission in our community. All issues noted in this document were discussed and addressed.  A physical exam was not performed with this format.   I connected with Miranda Perkins on 01/06/2023 at 1215 by telephone and verified that I am speaking with the correct person using two identifiers. Miranda Perkins is currently located at home and patient is currently with them during visit. The provider, Monia Pouch, FNP is located in their office at time of visit.  I discussed the limitations, risks, security and privacy concerns of performing an evaluation and management service by virtual visit and the availability of in person appointments. I also discussed with the patient that there may be a patient responsible charge related to this service. The patient expressed understanding and agreed to proceed.  Subjective:  Patient ID: Miranda Perkins, female    DOB: 08-07-1961, 62 y.o.   MRN: [id number]  Chief Complaint:  URI   HPI: Miranda Perkins is a 62 y.o. female presenting on 01/06/2023 for URI   Negative COVID testing at home. Ongoing and worsening symptoms over the last 1.5-2 weeks  URI  This is a new problem. The problem has been gradually worsening. Associated symptoms include congestion, coughing, headaches and rhinorrhea. Pertinent negatives include no abdominal pain, chest pain, diarrhea, dysuria, ear pain, joint pain, joint swelling, nausea, neck pain, plugged ear sensation, rash, sinus pain, sneezing, sore throat, swollen glands, vomiting or wheezing. She has tried acetaminophen and decongestant for the symptoms. The treatment provided no relief.     Relevant past  medical, surgical, family, and social history reviewed and updated as indicated.  Allergies and medications reviewed and updated.   Past Medical History:  Diagnosis Date   Arthritis    Edema of both lower extremities    Endometrial polyp    Hirsutism    History of 2019 novel coronavirus disease (COVID-19) 12/21/2020   positive home result documented in pcp note in epic 12-23-2020,  residual doe, pulmonology consult w/ dr wert note in epic 03-20-2021   History of basal cell carcinoma ([place]) excision 2016   forehead, per pt no recurrence   History of CVA (cerebrovascular accident) without residual deficits 1993   pt stated , while on Chloramphenicol, no residuaL; [medication given to treat tick bite] ; patient states  "i stroked out right next to my doctor , he said i passed out and that was my only symptom" ;  no resiudal   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 branch,  work-up results in epic 03/ 2022 (normal nuclear stress test, normal echo, no arrhythmia's per event monitor);   (05-28-2021 per pt no symptoms since 03/ 2022)   Multiple thyroid nodules    endocrinologist--- dr g. nida,  hx benign bx, clinically euthyroid   Osteopenia 12/2017   T score -1.2 FRAX 4.9% / 0.3%   PMB (postmenopausal bleeding)    Post-COVID chronic dyspnea    Rosacea    Thickened endometrium     Past Surgical History:  Procedure Laterality Date   ANTERIOR  AND POSTERIOR REPAIR N/A 08/21/2020   Procedure: ANTERIOR (CYSTOCELE) AND POSTERIOR REPAIR (RECTOCELE);  Surgeon: Princess Bruins, MD;  Location: [facility];  Service: Gynecology;  Laterality: N/A;  requesting 9:00am OR time  requests one hour   ANTERIOR CERVICAL DECOMP/DISCECTOMY FUSION N/A 09/27/2017   Procedure: ANTERIOR CERVICAL DECOMPRESSION/DISCECTOMY FUSION CERVICAL  FOUR-FIVE ,CERVICAL FIVE-SIX,CERVICAL SIX-SEVEN;  Surgeon: Kary Kos, MD;  Location: [place];  Service: Neurosurgery;  Laterality: N/A;   CARPAL TUNNEL RELEASE Left 09/27/2017   Procedure: CARPAL TUNNEL RELEASE;  Surgeon: Kary Kos, MD;  Location: [place];  Service: Neurosurgery;  Laterality: Left;   DILATATION & CURETTAGE/HYSTEROSCOPY WITH MYOSURE N/A 06/03/2021   Procedure: DILATATION & CURETTAGE/HYSTEROSCOPY WITH MYOSURE;  Surgeon: Princess Bruins, MD;  Location: [place];  Service: Gynecology;  Laterality: N/A;   FOOT SURGERY     bone removed from pinky toe   INGUINAL HERNIA REPAIR Right 1995   KNEE ARTHROSCOPY W/ MENISCAL REPAIR Left 06/21/2017   dr Rushie Nyhan   LEG SURGERY  1972   MOHS SURGERY  2016   [place] of forehead   MOUTH SURGERY  2015   removal and cauterization pallete of cancerous lesion   TONSILLECTOMY  1979   TOTAL KNEE ARTHROPLASTY Left 01/26/2018   Procedure: LEFT TOTAL KNEE ARTHROPLASTY;  Surgeon: Latanya Maudlin, MD;  Location: [facility];  Service: Orthopedics;  Laterality: Left;   TUBAL LIGATION      Social History   Socioeconomic History   Marital status: Married    Spouse name: Not on file   Number of children: 6   Years of education: Not on file   Highest education level: Not on file  Occupational History   Occupation: retired    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 Raising 2  grandchildren of deceased daughter.       Lives in one story ranch house.    Social Determinants of Health   Financial Resource Strain: Not on file  Food Insecurity: Not on file  Transportation Needs: Not on file  Physical Activity: Not on file  Stress: Not on file  Social Connections: Not on file  Intimate Partner Violence: Not on file    Outpatient Encounter Medications as of 01/06/2023  Medication Sig   doxycycline (VIBRA-TABS) 100 MG tablet Take 1 tablet (100 mg total) by mouth 2 (two) times daily for 10 days. 1 po bid   albuterol (VENTOLIN HFA) 108 (90 Base) MCG/ACT inhaler Inhale 2 puffs into the lungs every 6 (six) hours as needed.   Cholecalciferol (VITAMIN D3 PO) Take 1 tablet by mouth daily in the afternoon.   Cyanocobalamin (VITAMIN B-12 PO) Take 1 tablet by mouth daily in the afternoon.   furosemide (LASIX) 40 MG tablet Take 1.5 tablets (60 mg total) by mouth daily.  ibuprofen (ADVIL) 800 MG tablet Take 1 tablet (800 mg total) by mouth 2 (two) times daily as needed for moderate pain.   metroNIDAZOLE (METROGEL) 1 % gel Apply topically daily. (Patient not taking: Reported on 11/10/2022)   [DISCONTINUED] albuterol (VENTOLIN HFA) 108 (90 Base) MCG/ACT inhaler Inhale 2 puffs into the lungs every 6 (six) hours as needed.   [DISCONTINUED] doxycycline (VIBRAMYCIN) 50 MG capsule Take 1 capsule (50 mg total) by mouth daily as needed (rosacea flare up).   No facility-administered encounter medications on file as of 01/06/2023.    Allergies  Allergen Reactions   Influenza Vaccines Anaphylaxis and Other (See Comments)    Per patient   Penicillins Anaphylaxis and Other (See Comments)    PATIENT HAS HAD A PCN REACTION WITH IMMEDIATE RASH, FACIAL/TONGUE/THROAT SWELLING, SOB, OR LIGHTHEADEDNESS WITH HYPOTENSION:  #  #  #  YES  #  #  #   Has patient had a PCN reaction causing severe rash involving mucus membranes or skin necrosis: No PATIENT HAS HAD A PCN REACTION THAT REQUIRED  HOSPITALIZATION:  #  #  #  YES  #  #  #  Has patient had a PCN reaction occurring within the last 10 years: No    Articaine Other (See Comments)    Caused infection   Cortisone Other (See Comments)    Turned red and ran a low grade fever for 3 days   Other Rash    bandaids- skin turns red and a rash     Review of Systems  Constitutional:  Positive for activity change, appetite change, chills and fever. Negative for diaphoresis, fatigue and unexpected weight change.  HENT:  Positive for congestion and rhinorrhea. Negative for dental problem, drooling, ear discharge, ear pain, facial swelling, hearing loss, mouth sores, nosebleeds, postnasal drip, sinus pressure, sinus pain, sneezing, sore throat, tinnitus, trouble swallowing and voice change.   Respiratory:  Positive for cough and shortness of breath. Negative for apnea, choking, chest tightness, wheezing and stridor.   Cardiovascular:  Negative for chest pain.  Gastrointestinal:  Negative for abdominal pain, diarrhea, nausea and vomiting.  Genitourinary:  Negative for decreased urine volume, difficulty urinating and dysuria.  Musculoskeletal:  Negative for arthralgias, joint pain, myalgias and neck pain.  Skin:  Negative for rash.  Neurological:  Positive for headaches. Negative for dizziness, tremors, seizures, syncope, facial asymmetry, speech difficulty, weakness, light-headedness and numbness.  Psychiatric/Behavioral:  Negative for confusion.   All other systems reviewed and are negative.        Observations/Objective: No vital signs or physical exam, this was a virtual health encounter.  Pt alert and oriented, answers all questions appropriately, and able to speak in full sentences.    Assessment and Plan: Miranda Perkins was seen today for uri.  Diagnoses and all orders for this visit:  URI with cough and congestion Fever and chills Shortness of breath Ongoing and worsening symptoms despite symptomatic care at home. Negative  COVID testing at home. Will treat with below. Aware to continue Mucinex, Tylenol, and cough suppressant. Aware to stay hydrated. Aware to report new, worsening, or persistent symptoms.  -     albuterol (VENTOLIN HFA) 108 (90 Base) MCG/ACT inhaler; Inhale 2 puffs into the lungs every 6 (six) hours as needed. -     doxycycline (VIBRA-TABS) 100 MG tablet; Take 1 tablet (100 mg total) by mouth 2 (two) times daily for 10 days. 1 po bid     Follow Up Instructions: Return if symptoms worsen  or fail to improve.    I discussed the assessment and treatment plan with the patient. The patient was provided an opportunity to ask questions and all were answered. The patient agreed with the plan and demonstrated an understanding of the instructions.   The patient was advised to call back or seek an in-person evaluation if the symptoms worsen or if the condition fails to improve as anticipated.  The above assessment and management plan was discussed with the patient. The patient verbalized understanding of and has agreed to the management plan. Patient is aware to call the clinic if they develop any new symptoms or if symptoms persist or worsen. Patient is aware when to return to the clinic for a follow-up visit. Patient educated on when it is appropriate to go to the emergency department.    I provided 12 minutes of time during this telephone encounter.   Monia Pouch, FNP-C [facility] [address] [place], [place] [address] [phone] 01/06/2023

## 2023-03-30 ENCOUNTER — Telehealth: Payer: Self-pay

## 2023-03-30 NOTE — Transitions of Care (Post Inpatient/ED Visit) (Unsigned)
   03/30/2023  Name: AJWA FRATTINI MRN: [id number] DOB: 27-Aug-1961  Today's TOC FU Call Status: Today's TOC FU Call Status:: Successful TOC FU Call Competed TOC FU Call Complete Date: 03/30/23  Transition Care Management Follow-up Telephone Call Date of Discharge: 03/29/23 Discharge Facility: Other ([facility]) Name of Other ([facility]) [facility]charge Facility: [facility] Type of Discharge: Inpatient Admission How have you been since you were released from the hospital?: Same Any questions or concerns?: No  Items Reviewed: Did you receive and understand the discharge instructions provided?: Yes Medications obtained,verified, and reconciled?: Yes (Medications Reviewed) Any new allergies since your discharge?: No Dietary orders reviewed?: Yes Do you have support at home?: Yes People in Home: spouse  Medications Reviewed Today: Medications Reviewed Today     Reviewed by Karena Addison, LPN (Licensed Practical Nurse) on 03/30/23 at 1012  Med List Status: <None>   Medication Order Taking? Sig Documenting Provider Last Dose Status Informant  albuterol (VENTOLIN HFA) 108 (90 Base) MCG/ACT inhaler [id number] Yes Inhale 2 puffs into the lungs every 6 (six) hours as needed. Sonny Masters, FNP Taking Active   Cholecalciferol (VITAMIN D3 PO) [id number] Yes Take 1 tablet by mouth daily in the afternoon. [provider] Taking Active   Cyanocobalamin (VITAMIN B-12 PO) [id number] Yes Take 1 tablet by mouth daily in the afternoon. [provider] Taking Active   furosemide (LASIX) 40 MG tablet [id number] Yes Take 1.5 tablets (60 mg total) by mouth daily. Antoine Poche, MD Taking Active   ibuprofen (ADVIL) 800 MG tablet [id number] Yes Take 1 tablet (800 mg total) by mouth 2 (two) times daily as needed for moderate pain. Delynn Flavin M, DO Taking Active   metroNIDAZOLE (METROGEL) 1 % gel [id number] No Apply topically daily.  Patient not taking: Reported on 11/10/2022    [name], DO Not Taking Active             Home Care and Equipment/Supplies: Were Home Health Services Ordered?: NA Any new equipment or medical supplies ordered?: NA  Functional Questionnaire: Do you need assistance with bathing/showering or dressing?: No Do you need assistance with meal preparation?: No Do you need assistance with eating?: No Do you have difficulty maintaining continence: No Do you need assistance with getting out of bed/getting out of a chair/moving?: No Do you have difficulty managing or taking your medications?: No  Follow up appointments reviewed: PCP Follow-up appointment confirmed?: No (no avail appt times, sent message to staff to schedule) MD Provider Line Number:[phone] Given: No Specialist Hospital Follow-up appointment confirmed?: NA Do you need transportation to your follow-up appointment?: No Do you understand care options if your condition(s) worsen?: Yes-patient verbalized understanding    SIGNATURE Karena Addison, LPN [facility] Nurse Health Advisor Direct Dial [phone]

## 2023-04-06 ENCOUNTER — Ambulatory Visit: Admitting: Family Medicine

## 2023-04-06 ENCOUNTER — Encounter: Payer: Self-pay | Admitting: Family Medicine

## 2023-04-06 VITALS — BP 125/76 | HR 62 | Temp 98.3°F | Ht 66.0 in | Wt 248.0 lb

## 2023-04-06 DIAGNOSIS — E2749 Other adrenocortical insufficiency: Secondary | ICD-10-CM | POA: Diagnosis not present

## 2023-04-06 DIAGNOSIS — S37812D Contusion of adrenal gland, subsequent encounter: Secondary | ICD-10-CM | POA: Diagnosis not present

## 2023-04-06 DIAGNOSIS — R110 Nausea: Secondary | ICD-10-CM | POA: Diagnosis not present

## 2023-04-06 DIAGNOSIS — N950 Postmenopausal bleeding: Secondary | ICD-10-CM | POA: Diagnosis not present

## 2023-04-06 DIAGNOSIS — S37812A Contusion of adrenal gland, initial encounter: Secondary | ICD-10-CM

## 2023-04-06 LAB — ANA COMPREHENSIVE PANEL

## 2023-04-06 LAB — ANTIPHOSPHOLIPID SYNDROME COMP

## 2023-04-06 LAB — URINALYSIS
Bilirubin, UA: NEGATIVE
Glucose, UA: NEGATIVE
Ketones, UA: NEGATIVE
Leukocytes,UA: NEGATIVE
Nitrite, UA: NEGATIVE
Protein,UA: NEGATIVE
Specific Gravity, UA: 1.02 (ref 1.005–1.030)
Urobilinogen, Ur: 0.2 mg/dL (ref 0.2–1.0)
pH, UA: 7 (ref 5.0–7.5)

## 2023-04-06 LAB — CBC WITH DIFFERENTIAL/PLATELET
Eos: 2 %
Immature Granulocytes: 1 %
Lymphs: 30 %
Monocytes Absolute: 0.5 10*3/uL (ref 0.1–0.9)
Neutrophils: 58 %
Platelets: 343 10*3/uL (ref 150–450)
WBC: 6.5 10*3/uL (ref 3.4–10.8)

## 2023-04-06 LAB — CMP14+EGFR

## 2023-04-06 LAB — COAGUCHEK XS/INR WAIVED
INR: 1 (ref 0.9–1.1)
Prothrombin Time: 11.9 s

## 2023-04-06 MED ORDER — ONDANSETRON 4 MG PO TBDP
4.0000 mg | ORAL_TABLET | Freq: Three times a day (TID) | ORAL | 0 refills | Status: DC | PRN
Start: 2023-04-06 — End: 2023-04-23

## 2023-04-06 NOTE — Patient Instructions (Signed)
I'm looking for an autoimmune disease which would predispose you to abnormal bleeding I have also referred you to my Ob/GYN.  Dr L has left/ retired.

## 2023-04-06 NOTE — Progress Notes (Signed)
Subjective: CC: Adrenal hemorrhage PCP: [name], DO [id number] A Miranda Perkins is a 62 y.o. female presenting to clinic today for:  1.  Hospital follow-up visit for adrenal hemorrhage Patient was seen at an outside emergency department.  She was admitted for observation after spontaneous right-sided adrenal hemorrhage/hematoma of unclear etiology.  Her hemoglobin was stable at discharge at 12.  There was no clinical evidence of active bleeding at discharge.  She was discharged with Motrin, oxycodone and promethazine.  She reports she primarily has been working with the NSAID.  The oxycodone really was of little benefit.  She notes in fact the most helpful medication was the nausea medicine that was given by IV.  She also reports that she has had ongoing vaginal spotting ever since she had intervention for cystocele, rectocele with Dr. Seymour Bars.  Currently this provider has left the practice.  She has been lost to follow-up since that time and has not been able to communicate this intermittent vaginal spotting that is been occurring in frankly requires pads every day.  She has been postmenopausal for several years   ROS: Per HPI  Allergies  Allergen Reactions   Influenza Vaccines Anaphylaxis and Other (See Comments)    Per patient   Penicillins Anaphylaxis and Other (See Comments)    PATIENT HAS HAD A PCN REACTION WITH IMMEDIATE RASH, FACIAL/TONGUE/THROAT SWELLING, SOB, OR LIGHTHEADEDNESS WITH HYPOTENSION:  #  #  #  YES  #  #  #   Has patient had a PCN reaction causing severe rash involving mucus membranes or skin necrosis: No PATIENT HAS HAD A PCN REACTION THAT REQUIRED HOSPITALIZATION:  #  #  #  YES  #  #  #  Has patient had a PCN reaction occurring within the last 10 years: No    Articaine Other (See Comments)    Caused infection   Cortisone Other (See Comments)    Turned red and ran a low grade fever for 3 days   Other Rash    bandaids- skin turns red and a rash    Past  Medical History:  Diagnosis Date   Arthritis    Edema of both lower extremities    Endometrial polyp    Hirsutism    History of 2019 novel coronavirus disease (COVID-19) 12/21/2020   positive home result documented in pcp note in epic 12-23-2020,  residual doe, pulmonology consult w/ dr wert note in epic 03-20-2021   History of basal cell carcinoma (BCC) excision 2016   forehead, per pt no recurrence   History of CVA (cerebrovascular accident) without residual deficits 1993   pt stated , while on Chloramphenicol, no residuaL; [medication given to treat tick bite] ; patient states  "i stroked out right next to my doctor , he said i passed out and that was my only symptom" ;  no resiudal   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 branch,  work-up results in epic 03/ 2022 (normal nuclear stress test, normal echo, no arrhythmia's per event monitor);   (05-28-2021 per pt no symptoms since 03/ 2022)   Multiple thyroid nodules    endocrinologist--- dr g. nida,  hx benign bx, clinically euthyroid   Osteopenia 12/2017   T score -1.2 FRAX 4.9% / 0.3%   PMB (postmenopausal bleeding)  Post-COVID chronic dyspnea    Rosacea    Thickened endometrium     Current Outpatient Medications:    albuterol (VENTOLIN HFA) 108 (90 Base) MCG/ACT inhaler, Inhale 2 puffs into the lungs every 6 (six) hours as needed., Disp: 18 g, Rfl: 2   Cholecalciferol (VITAMIN D3 PO), Take 1 tablet by mouth daily in the afternoon., Disp: , Rfl:    Cyanocobalamin (VITAMIN B-12 PO), Take 1 tablet by mouth daily in the afternoon., Disp: , Rfl:    furosemide (LASIX) 40 MG tablet, Take 1.5 tablets (60 mg total) by mouth daily., Disp: 135 tablet, Rfl: 3   ibuprofen (ADVIL) 800 MG tablet, Take 1 tablet (800 mg total) by mouth 2 (two) times daily as needed for moderate pain., Disp:  90 tablet, Rfl: 3   metroNIDAZOLE (METROGEL) 1 % gel, Apply topically daily. 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, television/film/video for [employer]   Tobacco Use   Smoking status: Former    Packs/day: 0.50    Years: 35.00    Additional pack years: 0.00    Total pack years: 17.50    Types: Cigarettes    Quit date: 11/26/2009    Years since quitting: 13.3   Smokeless tobacco: Never  Vaping Use   Vaping Use: Never used  Substance and Sexual Activity   Alcohol use: Not Currently   Drug use: No   Sexual activity: Not Currently    Partners: Male    Birth control/protection: Post-menopausal    Comment: 1st intercourse 74 yo-5 partners, married- 25 yrs  Other Topics Concern   Not on file  Social History Narrative   Lives with husband. Raising 2 grandchildren of deceased daughter.       Lives in one story ranch house.    Social Determinants of Health   Financial Resource Strain: Not on file  Food Insecurity: Not on file  Transportation Needs: Not on file  Physical Activity: Not on file  Stress: Not on file  Social Connections: Not on file  Intimate Partner Violence: Not on file   Family History  Problem Relation Age of Onset   Rheum arthritis Sister    Clotting disorder Sister    Rheum arthritis Sister    Thyroid disease Sister    Clotting disorder Maternal Aunt    Clotting disorder Maternal Aunt    Heart Problems Mother    Rheum arthritis Mother    Cancer Father        ? type   Cancer Paternal Grandfather        Brain cancer    Objective: Office vital signs reviewed. BP 125/76   Pulse 62   Temp 98.3 F (36.8 C)   Ht 5\' 6"  (1.676 m)   Wt 248 lb (112.5 kg)   SpO2 96%   BMI 40.03 kg/m    Physical Examination:  General: Awake, alert, well nourished, No acute distress HEENT: no conjunctival injection Cardio: regular rate and rhythm  Pulm:  normal work of breathing on room air GI: soft,+RUQ TTP. non-distended, bowel sounds present x4, no hepatomegaly, no splenomegaly, no masses    CLINICAL DATA:  Right flank pain  EXAM:  CT ABDOMEN AND PELVIS WITHOUT CONTRAST  TECHNIQUE: Multidetector CT imaging of the abdomen and pelvis was performed following the standard protocol without IV contrast.  RADIATION DOSE REDUCTION: This exam was performed according to the departmental dose-optimization program which includes automated exposure control, adjustment of the mA and/or kV according to patient size and/or use of iterative reconstruction technique.  COMPARISON:  11/29/2020  FINDINGS: Lower chest: No acute abnormality  Hepatobiliary: No focal hepatic abnormality. Gallbladder unremarkable.  Pancreas: No focal abnormality or ductal dilatation.  Spleen: No focal abnormality.  Normal size.  Adrenals/Urinary Tract: There is high-density enlargement of the right adrenal gland with surrounding stranding. This is most compatible with adrenal hemorrhage. High-density material continues along the superior margin of the right kidney. No stones or hydronephrosis. Left adrenal gland and kidney unremarkable. Urinary bladder unremarkable.  Stomach/Bowel: Stomach, large and small bowel grossly unremarkable.  Vascular/Lymphatic: Aortic atherosclerosis. No evidence of aneurysm or adenopathy.  Reproductive: Uterus and adnexa unremarkable.  No mass.  Other: No free fluid or free air.  Musculoskeletal: No acute bony abnormality.  IMPRESSION: High-density enlargement of the right adrenal gland with stranding most compatible with adrenal hemorrhage. This continues along the superior margin of the right kidney.  No urinary tract stones or hydronephrosis.  Aortic  atherosclerosis.   Electronically Signed   By: Charlett Nose M.D.   On: 03/29/2023 00:05 Exam End: 03/28/23 23:44   Specimen Collected: 03/29/23 00:02     Assessment/ Plan: 62 y.o. female   Adrenal hemorrhage (HCC) - Plan: ANA Comprehensive Panel, Antiphospholipid Syndrome Comp, CBC with Differential, C-reactive protein, Sedimentation Rate, CoaguChek XS/INR Waived, Urinalysis, CMP14+EGFR, Ambulatory referral to Obstetrics / Gynecology, CoaguChek XS/INR Waived, ondansetron (ZOFRAN-ODT) 4 MG disintegrating tablet  Adrenal hematoma, initial encounter - Plan: ANA Comprehensive Panel, Antiphospholipid Syndrome Comp, CBC with Differential, C-reactive protein, Sedimentation Rate, CoaguChek XS/INR Waived, Urinalysis, CMP14+EGFR, Ambulatory referral to Obstetrics / Gynecology, CoaguChek XS/INR Waived  Postmenopausal bleeding - Plan: Ambulatory referral to Obstetrics / Gynecology  Nausea - Plan: ondansetron (ZOFRAN-ODT) 4 MG disintegrating tablet  I am going to look for possible antiphospholipid syndrome/autoimmune disorder given spontaneous adrenal hemorrhage/hematoma.  She does have 1 miscarriage in her medical history and given ongoing vaginal bleeding I am very concerned that she has some type of disorder that has precipitated this hemorrhage.  Her exam was notable for some mild right upper quadrant tenderness that does radiate to the back.  No guarding or rebound.  Recheck CBC to ensure stability of hemoglobin.  I advised her that probably use of oral NSAIDs would not be in her best interest given AKI and recent bleed.  She will focus on Tylenol.  I have sent in the Zofran, this appears to be what they gave her via IV in the hospital.  Furthermore given postmenopausal bleeding I placed a stat referral to OB/GYN in [place] since her OB/GYN is no longer available.  I would like to see her closely back in the next month or 2, sooner if concerns arise  No orders of the defined types were placed in  this encounter.  No orders of the defined types were placed in this encounter.   Today's visit is for Transitional Care Management.  The patient was discharged from [facility] on 03/29/2023 with a primary diagnosis of Adrenal hemorrhage.   Contact with the patient and/or caregiver, by a clinical staff member, was made on 03/30/2023 and was documented as a telephone encounter within the EMR.  Through chart review and discussion with the  patient I have determined that management of their condition is of moderate complexity.    [name], DO [facility] [phone]

## 2023-04-07 ENCOUNTER — Other Ambulatory Visit: Payer: Self-pay | Admitting: "Endocrinology

## 2023-04-07 ENCOUNTER — Encounter: Payer: Self-pay | Admitting: Family Medicine

## 2023-04-07 ENCOUNTER — Telehealth: Payer: Self-pay | Admitting: "Endocrinology

## 2023-04-07 DIAGNOSIS — E042 Nontoxic multinodular goiter: Secondary | ICD-10-CM

## 2023-04-07 LAB — CBC WITH DIFFERENTIAL/PLATELET
Lymphocytes Absolute: 1.9 10*3/uL (ref 0.7–3.1)
MCHC: 32.4 g/dL (ref 31.5–35.7)
MCV: 87 fL (ref 79–97)
Monocytes: 8 %
Neutrophils Absolute: 3.9 10*3/uL (ref 1.4–7.0)

## 2023-04-07 LAB — CMP14+EGFR
ALT: 23 IU/L (ref 0–32)
Albumin: 4.4 g/dL (ref 3.9–4.9)
Alkaline Phosphatase: 80 IU/L (ref 44–121)
CO2: 24 mmol/L (ref 20–29)
Creatinine, Ser: 0.84 mg/dL (ref 0.57–1.00)
Total Protein: 6.7 g/dL (ref 6.0–8.5)

## 2023-04-07 LAB — ANA COMPREHENSIVE PANEL
Chromatin Ab SerPl-aCnc: <0.2 AI (ref 0.0–0.9)
ENA SM Ab Ser-aCnc: <0.2 AI (ref 0.0–0.9)
ENA SSA (RO) Ab: <0.2 AI (ref 0.0–0.9)

## 2023-04-07 LAB — ANTIPHOSPHOLIPID SYNDROME COMP

## 2023-04-07 NOTE — Telephone Encounter (Signed)
Can you place new lab orders for pt? She wants to be seen.  Also, has an adrenal bleed from [facility] - she was admitted on 5/26 and was discharged on 5/27. She did see her PCP recently since then. Please Review

## 2023-04-13 ENCOUNTER — Other Ambulatory Visit

## 2023-04-13 ENCOUNTER — Other Ambulatory Visit: Payer: Self-pay | Admitting: Family Medicine

## 2023-04-13 DIAGNOSIS — E2749 Other adrenocortical insufficiency: Secondary | ICD-10-CM

## 2023-04-13 DIAGNOSIS — S37812A Contusion of adrenal gland, initial encounter: Secondary | ICD-10-CM

## 2023-04-13 DIAGNOSIS — R760 Raised antibody titer: Secondary | ICD-10-CM

## 2023-04-13 LAB — CBC WITH DIFFERENTIAL/PLATELET
Basophils Absolute: 0 10*3/uL (ref 0.0–0.2)
Basos: 1 %
EOS (ABSOLUTE): 0.1 10*3/uL (ref 0.0–0.4)
Hematocrit: 39.8 % (ref 34.0–46.6)
Hemoglobin: 12.9 g/dL (ref 11.1–15.9)
Immature Grans (Abs): 0 10*3/uL (ref 0.0–0.1)
MCH: 28.3 pg (ref 26.6–33.0)
RBC: 4.56 x10E6/uL (ref 3.77–5.28)
RDW: 13.4 % (ref 11.7–15.4)

## 2023-04-13 LAB — ANTIPHOSPHOLIPID SYNDROME COMP
APTT: 29.4 s
Anticardiolipin Ab, IgM: <10 [MPL'U]
Antiphosphatidylserine IgG: 16 {GPS'U} — ABNORMAL HIGH
Antiphosphatidylserine IgM: 10 {MPS'U}
Antiprothrombin Antibody, IgG: 1 G units
Beta-2 Glycoprotein I, IgA: <10 SAU

## 2023-04-13 LAB — ANA COMPREHENSIVE PANEL
Anti JO-1: <0.2 AI (ref 0.0–0.9)
Scleroderma (Scl-70) (ENA) Antibody, IgG: <0.2 AI (ref 0.0–0.9)
dsDNA Ab: <1 IU/mL (ref 0–9)

## 2023-04-13 LAB — C-REACTIVE PROTEIN: CRP: 16 mg/L — ABNORMAL HIGH (ref 0–10)

## 2023-04-13 LAB — CMP14+EGFR
BUN/Creatinine Ratio: 10 — ABNORMAL LOW (ref 12–28)
BUN: 8 mg/dL (ref 8–27)
Chloride: 105 mmol/L (ref 96–106)
Glucose: 84 mg/dL (ref 70–99)
Sodium: 143 mmol/L (ref 134–144)

## 2023-04-13 LAB — SEDIMENTATION RATE: Sed Rate: 31 mm/hr (ref 0–40)

## 2023-04-14 LAB — T3, FREE: T3, Free: 3.2 pg/mL (ref 2.0–4.4)

## 2023-04-14 LAB — CORTISOL-AM, BLOOD: Cortisol - AM: 13.5 ug/dL (ref 6.2–19.4)

## 2023-04-14 LAB — TSH: TSH: 1.85 u[IU]/mL (ref 0.450–4.500)

## 2023-04-14 LAB — T4, FREE: Free T4: 1.13 ng/dL (ref 0.82–1.77)

## 2023-04-15 ENCOUNTER — Other Ambulatory Visit: Payer: Self-pay | Admitting: *Deleted

## 2023-04-15 ENCOUNTER — Ambulatory Visit: Admitting: Nurse Practitioner

## 2023-04-15 ENCOUNTER — Encounter: Payer: Self-pay | Admitting: Nurse Practitioner

## 2023-04-15 VITALS — BP 107/72 | HR 80 | Temp 97.3°F | Resp 20 | Ht 66.0 in | Wt 247.0 lb

## 2023-04-15 DIAGNOSIS — R0602 Shortness of breath: Secondary | ICD-10-CM

## 2023-04-15 DIAGNOSIS — R051 Acute cough: Secondary | ICD-10-CM | POA: Diagnosis not present

## 2023-04-15 MED ORDER — ALBUTEROL SULFATE HFA 108 (90 BASE) MCG/ACT IN AERS
2.0000 | INHALATION_SPRAY | Freq: Four times a day (QID) | RESPIRATORY_TRACT | 2 refills | Status: DC | PRN
Start: 2023-04-15 — End: 2023-06-26

## 2023-04-15 MED ORDER — BENZONATATE 100 MG PO CAPS: 100.0000 mg | ORAL_CAPSULE | Freq: Three times a day (TID) | ORAL | 0 refills | Status: DC | PRN

## 2023-04-15 NOTE — Patient Instructions (Signed)
Cough, Adult A cough helps to clear your throat and lungs. It may be a sign of an illness or another condition. A short-term (acute) cough may last 2-3 weeks. A long-term (chronic) cough may last 8 or more weeks. Many things can cause a cough. They include: Illnesses such as: An infection in your throat or lungs. Asthma or other heart or lung problems. Gastroesophageal reflux. This is when acid comes back up from your stomach. Breathing in things that bother (irritate) your lungs. Allergies. Postnasal drip. This is when mucus runs down the back of your throat. Smoking. Some medicines. Follow these instructions at home: Medicines Take over-the-counter and prescription medicines only as told by your doctor. Talk with your doctor before you take cough medicine (cough suppressants). Eating and drinking Do not drink alcohol. Do not drink caffeine. Drink enough fluid to keep your pee (urine) pale yellow. Lifestyle Stay away from cigarette smoke. Do not smoke or use any products that contain nicotine or tobacco. If you need help quitting, ask your doctor. Stay away from things that make you cough. These may include perfume, candles, cleaning products, or campfire smoke. General instructions  Watch for any changes to your cough. Tell your doctor about them. Always cover your mouth when you cough. If the air is dry in your home, use a cool mist vaporizer or humidifier. If your cough is worse at night, try using extra pillows to raise your head up higher while you sleep. Rest as needed. Contact a doctor if: You have new symptoms. Your symptoms get worse. You cough up pus. You have a fever that does not go away. Your cough does not get better after 2-3 weeks. Cough medicine does not help, and you are not sleeping well. You have pain that gets worse or is not helped with medicine. You are losing weight and do not know why. You have night sweats. Get help right away if: You cough up  blood. You have trouble breathing. Your heart is beating very fast. These symptoms may be an emergency. Get help right away. Call 911. Do not wait to see if the symptoms will go away. Do not drive yourself to the hospital. This information is not intended to replace advice given to you by your health care provider. Make sure you discuss any questions you have with your health care provider. Document Revised: 06/19/2022 Document Reviewed: 06/19/2022 Elsevier Patient Education  2024 Elsevier Inc.  

## 2023-04-15 NOTE — Progress Notes (Signed)
Subjective:    Patient ID: Miranda Perkins, female    DOB: 1961-10-06, 62 y.o.   MRN: [id number]   Chief Complaint: Cough and chest congestion   Patient went to hospital in May and was admitted for adrenal bleed. Since then she stopped all of her meds. She was unsure if it was safe to take them any longer. But since then sha has been sob with swelling of bil lower ext.  Cough This is a new problem. The current episode started in the past 7 days. The problem has been waxing and waning. The problem occurs every few minutes. The cough is Productive of sputum. Associated symptoms include nasal congestion, rhinorrhea and shortness of breath. Pertinent negatives include no ear congestion, ear pain or sore throat. Nothing aggravates the symptoms. She has tried nothing for the symptoms. The treatment provided no relief.    Patient Active Problem List   Diagnosis Date Noted   Mixed hyperlipidemia 12/29/2021   Morbid obesity (HCC) 12/29/2021   DOE (dyspnea on exertion) 03/20/2021   Upper airway cough syndrome vs cough variant asthma 03/20/2021   Elevated testosterone level in female 12/20/2020   Female hirsutism 12/20/2020   Postoperative state 08/21/2020   Post-operative state 08/21/2020   Hx of total knee arthroplasty, left 01/26/2018   Spinal stenosis of cervical region 09/27/2017   Numbness and tingling in both hands 09/14/2016   Insomnia 09/14/2016   BMI 39.0-39.9,adult 09/14/2016   Depression 11/28/2015   Leg swelling 11/28/2015   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 Negative for ear pain and sore throat.   Respiratory:  Positive for cough and shortness of breath.        Objective:   Physical Exam Constitutional:      Appearance: Normal appearance.  Cardiovascular:     Rate and Rhythm: Normal rate and regular rhythm.     Heart sounds: Normal heart  sounds.  Pulmonary:     Effort: Pulmonary effort is normal.     Breath sounds: Normal breath sounds.  Musculoskeletal:     Right lower leg: Edema (1+) present.     Left lower leg: Edema (1+) present.  Neurological:     General: No focal deficit present.     Mental Status: She is alert and oriented to person, place, and time.  Psychiatric:        Mood and Affect: Mood normal.        Behavior: Behavior normal.     BP 107/72   Pulse 80   Temp (!) 97.3 F (36.3 C) (Temporal)   Resp 20   Ht 5\' 6"  (1.676 m)   Wt 247 lb (112 kg)   SpO2 95%   BMI 39.87 kg/m        Assessment & Plan:   Miranda Perkins in today with chief complaint of Cough and chest congestion   1. Shortness of breath Start back on lasix Elevate legs when sitting - albuterol (VENTOLIN HFA) 108 (90 Base) MCG/ACT inhaler; Inhale 2 puffs into the lungs every 6 (six) hours as needed.  Dispense: 18 g; Refill: 2 - CMP14+EGFR  2. Acute cough 1. Take meds as prescribed 2. Use a cool mist humidifier especially during the winter months and when heat has been humid. 3. Use saline nose sprays frequently 4. Saline irrigations of the nose can be very  helpful if done frequently.  * 4X daily for 1 week*  * Use of a nettie pot can be helpful with this. Follow directions with this* 5. Drink plenty of fluids 6. Keep thermostat turn down low 7.For any cough or congestion- tessalon perles 8. For fever or aces or pains- take tylenol or ibuprofen appropriate for age and weight.  * for fevers greater than 101 orally you may alternate ibuprofen and tylenol every  3 hours.       The above assessment and management plan was discussed with the patient. The patient verbalized understanding of and has agreed to the management plan. Patient is aware to call the clinic if symptoms persist or worsen. Patient is aware when to return to the clinic for a follow-up visit. Patient educated on when it is appropriate to go to the  emergency department.   Mary-Margaret Daphine Deutscher, FNP

## 2023-04-16 ENCOUNTER — Encounter: Payer: Self-pay | Admitting: Family Medicine

## 2023-04-16 NOTE — Telephone Encounter (Signed)
This is a PRN med. 90d is not appropriate.

## 2023-04-22 NOTE — Progress Notes (Signed)
[facility] [address], [place] [address]   Clinic Day:  04/23/2023  Referring physician: [name], DO  Patient Care Team: [name], DO as PCP - General (Family Medicine) [name] [name], MD as PCP - Cardiology (Cardiology)   ASSESSMENT & PLAN:   Assessment:  1.  Adrenal hemorrhage: - She suddenly developed right-sided back pain 2 hours prior to presentation to ER on 03/28/2023. - CT AP (03/28/2023): High density enlargement of the right adrenal gland with stranding most compatible with adrenal hemorrhage.  This continues along the superior margin of the right kidney. - No prior history of thrombosis or bleeding.  She had D&C/hysteroscopy in 2022 and dental extraction a year ago and inguinal hernia repair in 1995 without any excessive bleeding.  She is not on blood thinners. - She had 1 miscarriage in the first trimester.  2.  Social/family history: - Lives with husband at home.  Independent of ADLs and IADLs.  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  Quit cigarette smoking in 2010.  Smoked half pack per day for 15 years. - No family history of bleeding.  Mother had 3 miscarriages and 5 live births.  No family history of antiphospholipid syndrome.  Father and paternal grandfather had cancer.  Maternal first cousin had blood cancer.  Plan:  1.  Adrenal hemorrhage: - We discussed the different etiologies of adrenal hemorrhagic infarction including sepsis, anticoagulant therapy, hypercoagulable states. - We reviewed results of lupus anticoagulant which was negative. Anti phosphatidylserine IgG antibody was minimally elevated at 16, not impressive.  Antibeta 2 glycoprotein 1 and anticardiolipin antibodies were negative. - Recommend other hypercoagulable testing including factor V Leiden, prothrombin gene mutation,  protein C, protein S, Antithrombin III, JAK2 V617F among others. - RTC 3 weeks for follow-up.   Orders Placed This Encounter  Procedures   JAK2 V617F rfx CALR/MPL/E12-15    Standing Status:   Future    Number of Occurrences:   1    Standing Expiration Date:   04/22/2024   Factor 5 leiden    Standing Status:   Future    Number of Occurrences:   1    Standing Expiration Date:   04/22/2024   Prothrombin gene mutation    Standing Status:   Future    Number of Occurrences:   1    Standing Expiration Date:   04/22/2024   Protein C activity    Standing Status:   Future    Number of Occurrences:   1    Standing Expiration Date:   04/22/2024   Protein C, total    Standing Status:   Future    Number of Occurrences:   1    Standing Expiration Date:   04/22/2024   Protein S activity    Standing Status:   Future    Number of Occurrences:   1    Standing Expiration Date:   04/22/2024   Protein S, total and free    Standing Status:   Future    Number of Occurrences:   1    Standing Expiration Date:   04/22/2024   Antithrombin III    Standing Status:   Future    Number of Occurrences:   1    Standing Expiration Date:   04/22/2024   D-dimer, quantitative    Standing  Status:   Future    Number of Occurrences:   1    Standing Expiration Date:   04/22/2024   Cortisol    Standing Status:   Future    Number of Occurrences:   1    Standing Expiration Date:   04/22/2024   ACTH    Standing Status:   Future    Number of Occurrences:   1    Standing Expiration Date:   04/22/2024   Basic metabolic panel    Standing Status:   Future    Number of Occurrences:   1    Standing Expiration Date:   04/22/2024      I,Katie Daubenspeck,acting as a scribe for [name], MD.,have documented all relevant documentation on the behalf of [name], MD,as directed by  [name], MD while in the presence of [name], MD.   I, [name] MD, have reviewed the  above documentation for accuracy and completeness, and I agree with the above.   [name], MD   6/21/20243:14 PM  CHIEF COMPLAINT/PURPOSE OF CONSULT:   Diagnosis: positive antiphospholipid antibody  Current Therapy: Under workup  HISTORY OF PRESENT ILLNESS:   Miranda Perkins is a 62 y.o. female presenting to clinic today for evaluation of positive antiphospholipid antibody and adrenal hemorrhage at the request of Dr. Nadine Counts.  She presented to the ED on 03/28/23 with acute right shoulder and right flank pains. CT A/P revealed a likely contained spontaneous right adrenal hemorrhage/hematoma of unclear etiology. At follow up with her PCP on 04/06/23, Dr. Nadine Counts ordered an antiphospholipid syndrome panel. She was found to have a low positive result for the Antiphospholipid syndrome.  Today, she states that she is doing well overall. Her appetite level is at 75%. Her energy level is at 75%.  PAST MEDICAL HISTORY:   Past Medical History: Past Medical History:  Diagnosis Date   Arthritis    Edema of both lower extremities    Endometrial polyp    Hirsutism    History of 2019 novel coronavirus disease (COVID-19) 12/21/2020   positive home result documented in pcp note in epic 12-23-2020,  residual doe, pulmonology consult w/ dr wert note in epic 03-20-2021   History of basal cell carcinoma (BCC) excision 2016   forehead, per pt no recurrence   History of CVA (cerebrovascular accident) without residual deficits 1993   pt stated , while on Chloramphenicol, no residuaL; [medication given to treat tick bite] ; patient states  "i stroked out right next to my doctor , he said i passed out and that was my only symptom" ;  no resiudal   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 branch,  work-up results in epic 03/ 2022 (normal nuclear  stress test, normal echo, no arrhythmia's per event monitor);   (05-28-2021 per pt no symptoms since 03/ 2022)   Multiple thyroid nodules    endocrinologist--- dr g. nida,  hx benign bx, clinically euthyroid   Osteopenia 12/2017   T score -1.2 FRAX 4.9% / 0.3%   PMB (postmenopausal bleeding)    Post-COVID chronic dyspnea    Rosacea    Thickened endometrium     Surgical History: Past Surgical History:  Procedure Laterality Date   ANTERIOR AND POSTERIOR REPAIR N/A 08/21/2020   Procedure: ANTERIOR (CYSTOCELE) AND POSTERIOR REPAIR (  RECTOCELE);  Surgeon: Genia Del, MD;  Location: [facility];  Service: Gynecology;  Laterality: N/A;  requesting 9:00am OR time  requests one hour   ANTERIOR CERVICAL DECOMP/DISCECTOMY FUSION N/A 09/27/2017   Procedure: ANTERIOR CERVICAL DECOMPRESSION/DISCECTOMY FUSION CERVICAL FOUR-FIVE ,CERVICAL FIVE-SIX,CERVICAL SIX-SEVEN;  Surgeon: Donalee Citrin, MD;  Location: [facility] OR;  Service: Neurosurgery;  Laterality: N/A;   CARPAL TUNNEL RELEASE Left 09/27/2017   Procedure: CARPAL TUNNEL RELEASE;  Surgeon: Donalee Citrin, MD;  Location: [facility] OR;  Service: Neurosurgery;  Laterality: Left;   DILATATION & CURETTAGE/HYSTEROSCOPY WITH MYOSURE N/A 06/03/2021   Procedure: DILATATION & CURETTAGE/HYSTEROSCOPY WITH MYOSURE;  Surgeon: Genia Del, MD;  Location: [facility];  Service: Gynecology;  Laterality: N/A;   FOOT SURGERY     bone removed from pinky toe   INGUINAL HERNIA REPAIR Right 1995   KNEE ARTHROSCOPY W/ MENISCAL REPAIR Left 06/21/2017   dr Netta Corrigan   LEG SURGERY  1972   MOHS SURGERY  2016   BCC of forehead   MOUTH SURGERY  2015   removal and cauterization pallete of cancerous lesion   TONSILLECTOMY  1979   TOTAL KNEE ARTHROPLASTY Left 01/26/2018   Procedure: LEFT TOTAL KNEE ARTHROPLASTY;  Surgeon: Ranee Gosselin, MD;  Location: [facility];  Service: Orthopedics;  Laterality: Left;   TUBAL LIGATION      Social History: Social  History   Socioeconomic History   Marital status: Married    Spouse name: Not on file   Number of children: 6   Years of education: Not on file   Highest education level: Not on file  Occupational History   Occupation: retired    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, television/film/video for [employer]   Tobacco Use   Smoking status: Former    Packs/day: 0.50    Years: 35.00    Additional pack years: 0.00    Total pack years: 17.50    Types: Cigarettes    Quit date: 11/26/2009    Years since quitting: 13.4   Smokeless tobacco: Never  Vaping Use   Vaping Use: Never used  Substance and Sexual Activity   Alcohol use: Not Currently   Drug use: No   Sexual activity: Not Currently    Partners: Male    Birth control/protection: Post-menopausal    Comment: 1st intercourse 22 yo-5 partners, married- 25 yrs  Other Topics Concern   Not on file  Social History Narrative   Lives with husband. Raising 2 grandchildren of deceased daughter.       Lives in one story ranch house.    Social Determinants of Health   Financial Resource Strain: Not on file  Food Insecurity: Not on file  Transportation Needs: Not on file  Physical Activity: Not on file  Stress: Not on file  Social Connections: Not on file  Intimate Partner Violence: Not on file    Family History: Family History  Problem Relation Age of Onset   Rheum arthritis Sister    Clotting disorder Sister    Rheum arthritis Sister    Thyroid disease Sister    Clotting disorder Maternal Aunt    Clotting disorder Maternal Aunt    Heart Problems Mother    Rheum arthritis Mother    Cancer Father        ? type   Cancer Paternal Grandfather        Brain cancer    Current Medications:  Current Outpatient Medications:    albuterol (VENTOLIN HFA) 108 (90 Base) MCG/ACT inhaler, Inhale  2 puffs into the lungs every 6 (six) hours as needed., Disp: 18 g, Rfl: 2   furosemide (LASIX) 40 MG tablet, Take 1.5 tablets (60 mg total) by mouth daily. (Patient not taking:  Reported on 04/15/2023), Disp: 135 tablet, Rfl: 3   ibuprofen (ADVIL) 800 MG tablet, Take 1 tablet (800 mg total) by mouth 2 (two) times daily as needed for moderate pain. (Patient not taking: Reported on 04/15/2023), Disp: 90 tablet, Rfl: 3   Allergies: Allergies  Allergen Reactions   Influenza Vaccines Anaphylaxis and Other (See Comments)    Per patient   Penicillins Anaphylaxis and Other (See Comments)    PATIENT HAS HAD A PCN REACTION WITH IMMEDIATE RASH, FACIAL/TONGUE/THROAT SWELLING, SOB, OR LIGHTHEADEDNESS WITH HYPOTENSION:  #  #  #  YES  #  #  #   Has patient had a PCN reaction causing severe rash involving mucus membranes or skin necrosis: No PATIENT HAS HAD A PCN REACTION THAT REQUIRED HOSPITALIZATION:  #  #  #  YES  #  #  #  Has patient had a PCN reaction occurring within the last 10 years: No    Articaine Other (See Comments)    Caused infection   Cortisone Other (See Comments)    Turned red and ran a low grade fever for 3 days   Other Rash    bandaids- skin turns red and a rash     REVIEW OF SYSTEMS:   Review of Systems  Constitutional:  Negative for chills, fatigue and fever.  HENT:   Negative for lump/mass, mouth sores, nosebleeds, sore throat and trouble swallowing.   Eyes:  Negative for eye problems.  Respiratory:  Negative for cough and shortness of breath.   Cardiovascular:  Negative for chest pain, leg swelling and palpitations.  Gastrointestinal:  Negative for abdominal pain, constipation, diarrhea, nausea and vomiting.  Genitourinary:  Negative for bladder incontinence, difficulty urinating, dysuria, frequency, hematuria and nocturia.   Musculoskeletal:  Negative for arthralgias, back pain, flank pain, myalgias and neck pain.  Skin:  Negative for itching and rash.  Neurological:  Negative for dizziness, headaches and numbness.  Hematological:  Does not bruise/bleed easily.  Psychiatric/Behavioral:  Negative for depression, sleep disturbance and suicidal ideas.  The patient is not nervous/anxious.   All other systems reviewed and are negative.    VITALS:   Blood pressure 122/79, pulse 76, temperature 98.1 F (36.7 C), temperature source Tympanic, resp. rate 18, height 5\' 5"  (1.651 m), weight 245 lb 11.2 oz (111.4 kg), SpO2 97 %.  Wt Readings from Last 3 Encounters:  04/23/23 245 lb 11.2 oz (111.4 kg)  04/15/23 247 lb (112 kg)  04/06/23 248 lb (112.5 kg)    Body mass index is 40.89 kg/m.   PHYSICAL EXAM:   Physical Exam Vitals and nursing note reviewed. Exam conducted with a chaperone present.  Constitutional:      Appearance: Normal appearance.  Cardiovascular:     Rate and Rhythm: Normal rate and regular rhythm.     Pulses: Normal pulses.     Heart sounds: Normal heart sounds.  Pulmonary:     Effort: Pulmonary effort is normal.     Breath sounds: Normal breath sounds.  Abdominal:     Palpations: Abdomen is soft. There is no hepatomegaly, splenomegaly or mass.     Tenderness: There is no abdominal tenderness.  Musculoskeletal:     Right lower leg: No edema.     Left lower leg: No edema.  Lymphadenopathy:  Cervical: No cervical adenopathy.     Right cervical: No superficial, deep or posterior cervical adenopathy.    Left cervical: No superficial, deep or posterior cervical adenopathy.     Upper Body:     Right upper body: No supraclavicular or axillary adenopathy.     Left upper body: No supraclavicular or axillary adenopathy.  Neurological:     General: No focal deficit present.     Mental Status: She is alert and oriented to person, place, and time.  Psychiatric:        Mood and Affect: Mood normal.        Behavior: Behavior normal.     LABS:      Latest Ref Rng & Units 04/06/2023   11:09 AM 07/28/2022    2:13 PM 06/03/2021   10:30 AM  CBC  WBC 3.4 - 10.8 x10E3/uL 6.5  7.1  6.5   Hemoglobin 11.1 - 15.9 g/dL 16.1  09.6  04.5   Hematocrit 34.0 - 46.6 % 39.8  42.1  44.2   Platelets 150 - 450 x10E3/uL 343  291  278        Latest Ref Rng & Units 04/23/2023    8:54 AM 04/06/2023   11:09 AM 07/28/2022    2:13 PM  CMP  Glucose 70 - 99 mg/dL 409  84  811   BUN 8 - 23 mg/dL 14  8  13    Creatinine 0.44 - 1.00 mg/dL 9.14  7.82  9.56   Sodium 135 - 145 mmol/L 137  143  140   Potassium 3.5 - 5.1 mmol/L 3.8  5.0  4.0   Chloride 98 - 111 mmol/L 103  105  101   CO2 22 - 32 mmol/L 25  24  22    Calcium 8.9 - 10.3 mg/dL 9.2  9.7  9.3   Total Protein 6.0 - 8.5 g/dL  6.7  6.6   Total Bilirubin 0.0 - 1.2 mg/dL  0.5  0.4   Alkaline Phos 44 - 121 IU/L  80  80   AST 0 - 40 IU/L  13  13   ALT 0 - 32 IU/L  23  23      No results found for: "CEA1", "CEA" / No results found for: "CEA1", "CEA" No results found for: "PSA1" No results found for: "OZH086" No results found for: "CAN125"  No results found for: "TOTALPROTELP", "ALBUMINELP", "A1GS", "A2GS", "BETS", "BETA2SER", "GAMS", "MSPIKE", "SPEI" No results found for: "TIBC", "FERRITIN", "IRONPCTSAT" No results found for: "LDH"   STUDIES:   No results found.

## 2023-04-23 ENCOUNTER — Inpatient Hospital Stay: Attending: Hematology | Admitting: Hematology

## 2023-04-23 ENCOUNTER — Encounter: Payer: Self-pay | Admitting: Hematology

## 2023-04-23 ENCOUNTER — Inpatient Hospital Stay

## 2023-04-23 VITALS — BP 122/79 | HR 76 | Temp 98.1°F | Resp 18 | Ht 65.0 in | Wt 245.7 lb

## 2023-04-23 DIAGNOSIS — Z79899 Other long term (current) drug therapy: Secondary | ICD-10-CM | POA: Diagnosis not present

## 2023-04-23 DIAGNOSIS — Z808 Family history of malignant neoplasm of other organs or systems: Secondary | ICD-10-CM

## 2023-04-23 DIAGNOSIS — E2749 Other adrenocortical insufficiency: Secondary | ICD-10-CM | POA: Diagnosis present

## 2023-04-23 DIAGNOSIS — Z87891 Personal history of nicotine dependence: Secondary | ICD-10-CM | POA: Diagnosis not present

## 2023-04-23 LAB — BASIC METABOLIC PANEL
Anion gap: 9 (ref 5–15)
BUN: 14 mg/dL (ref 8–23)
CO2: 25 mmol/L (ref 22–32)
Calcium: 9.2 mg/dL (ref 8.9–10.3)
Chloride: 103 mmol/L (ref 98–111)
Creatinine, Ser: 0.74 mg/dL (ref 0.44–1.00)
GFR, Estimated: >60 mL/min (ref >60)
Glucose, Bld: 125 mg/dL — ABNORMAL HIGH (ref 70–99)
Potassium: 3.8 mmol/L (ref 3.5–5.1)
Sodium: 137 mmol/L (ref 135–145)

## 2023-04-23 LAB — CORTISOL: Cortisol, Plasma: 9.2 ug/dL

## 2023-04-23 LAB — ANTITHROMBIN III: AntiThromb III Func: 103 % (ref 75–120)

## 2023-04-23 LAB — D-DIMER, QUANTITATIVE: D-Dimer, Quant: 2.02 ug/mL-FEU — ABNORMAL HIGH (ref 0.00–0.50)

## 2023-04-23 NOTE — Patient Instructions (Signed)
You were seen and examined today by Dr. Ellin Saba. Dr. Ellin Saba is a hematologist, meaning that he specializes in blood abnormalities. Dr. Ellin Saba discussed your past medical history, family history of cancers/blood conditions and the events that led to you being here today.  You were referred to Dr. Ellin Saba due to adrenal hemorrhage.  Dr. Ellin Saba has recommended additional labs today for further evaluation.  Follow-up as scheduled.

## 2023-04-24 LAB — ACTH: C206 ACTH: 8.8 pg/mL (ref 7.2–63.3)

## 2023-04-24 LAB — PROTEIN S, TOTAL AND FREE
Protein S Ag, Free: 106 % (ref 61–136)
Protein S Ag, Total: 86 % (ref 60–150)

## 2023-04-24 LAB — PROTEIN C ACTIVITY: Protein C Activity: 145 % (ref 73–180)

## 2023-04-24 LAB — PROTEIN C, TOTAL: Protein C, Total: 122 % (ref 60–150)

## 2023-04-24 LAB — PROTEIN S ACTIVITY: Protein S Activity: 99 % (ref 63–140)

## 2023-04-27 ENCOUNTER — Encounter: Payer: Self-pay | Admitting: Obstetrics & Gynecology

## 2023-04-27 ENCOUNTER — Other Ambulatory Visit (HOSPITAL_COMMUNITY)
Admission: RE | Admit: 2023-04-27 | Discharge: 2023-04-27 | Disposition: A | Discharge status: Home / Self Care | Source: Ambulatory Visit | Attending: Obstetrics & Gynecology | Admitting: Obstetrics & Gynecology

## 2023-04-27 ENCOUNTER — Ambulatory Visit: Admitting: "Endocrinology

## 2023-04-27 ENCOUNTER — Ambulatory Visit: Admitting: Obstetrics & Gynecology

## 2023-04-27 ENCOUNTER — Encounter: Payer: Self-pay | Admitting: "Endocrinology

## 2023-04-27 VITALS — BP 128/76 | HR 76 | Ht 66.0 in | Wt 249.0 lb

## 2023-04-27 VITALS — BP 126/83 | HR 73 | Ht 66.0 in | Wt 247.0 lb

## 2023-04-27 DIAGNOSIS — E782 Mixed hyperlipidemia: Secondary | ICD-10-CM

## 2023-04-27 DIAGNOSIS — N950 Postmenopausal bleeding: Secondary | ICD-10-CM | POA: Diagnosis present

## 2023-04-27 DIAGNOSIS — Z6841 Body Mass Index (BMI) 40.0 and over, adult: Secondary | ICD-10-CM

## 2023-04-27 DIAGNOSIS — E042 Nontoxic multinodular goiter: Secondary | ICD-10-CM | POA: Diagnosis not present

## 2023-04-27 DIAGNOSIS — R7303 Prediabetes: Secondary | ICD-10-CM

## 2023-04-27 MED ORDER — TERCONAZOLE 0.4 % VA CREA
1.0000 | TOPICAL_CREAM | Freq: Every day | VAGINAL | 0 refills | Status: DC
Start: 2023-04-27 — End: 2023-05-21

## 2023-04-27 NOTE — Patient Instructions (Signed)

## 2023-04-27 NOTE — Addendum Note (Signed)
Addended by: Lazaro Arms on: 04/27/2023 11:41 AM   Modules accepted: Orders

## 2023-04-27 NOTE — Progress Notes (Signed)
Endometrial Biopsy Procedure Note  2022 Had hysteroscopy D&C for benign endometrial polyps, reviewed Dr Sharol Roussel notes and images and path report  Since then has continued to have intermittent spotting   Pre-operative Diagnosis: Post menopausal bleeding   Post-operative Diagnosis: same  Indications: postmenopausal bleeding  Procedure Details   Urine pregnancy test was not done.  The risks (including infection, bleeding, pain, and uterine perforation) and benefits of the procedure were explained to the patient and Written informed consent was obtained.  Antibiotic prophylaxis against endocarditis was not indicated.   The patient was placed in the dorsal lithotomy position.  Bimanual exam showed the uterus to be in the neutral position.  A Graves' speculum inserted in the vagina, and the cervix prepped with povidone iodine.  Endocervical curettage with a Kevorkian curette was not performed.   A sharp tenaculum was applied to the anterior lip of the cervix for stabilization.  A sterile uterine sound was used to sound the uterus to a depth of 6.5 cm.  A Pipelle endometrial aspirator was used to sample the endometrium.  Sample was sent for pathologic examination.  Condition: Stable  Complications: None  Plan:  The patient was advised to call for any fever or for prolonged or severe pain or bleeding. She was advised to use OTC analgesics as needed for mild to moderate pain. She was advised to avoid vaginal intercourse for 48 hours or until the bleeding has completely stopped.  Attending Physician Documentation: I was present for or performed the following: endometrial biopsy

## 2023-04-27 NOTE — Addendum Note (Signed)
Addended by: Moss Mc on: 04/27/2023 12:18 PM   Modules accepted: Orders

## 2023-04-27 NOTE — Progress Notes (Signed)
04/27/2023   Endocrinology follow-up note   Subjective:    Patient ID: Miranda Perkins, female    DOB: 1961/07/24, PCP [name], DO   Past Medical History:  Diagnosis Date   Adrenal hemorrhage (HCC)    Arthritis    Edema of both lower extremities    Endometrial polyp    Hirsutism    History of 2019 novel coronavirus disease (COVID-19) 12/21/2020   positive home result documented in pcp note in epic 12-23-2020,  residual doe, pulmonology consult w/ dr wert note in epic 03-20-2021   History of basal cell carcinoma (BCC) excision 2016   forehead, per pt no recurrence   History of CVA (cerebrovascular accident) without residual deficits 1993   pt stated , while on Chloramphenicol, no residuaL; [medication given to treat tick bite] ; patient states  "i stroked out right next to my doctor , he said i passed out and that was my only symptom" ;  no resiudal   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 branch,  work-up results in epic 03/ 2022 (normal nuclear stress test, normal echo, no arrhythmia's per event monitor);   (05-28-2021 per pt no symptoms since 03/ 2022)   Multiple thyroid nodules    endocrinologist--- dr g. Paisely Brick,  hx benign bx, clinically euthyroid   Osteopenia 12/2017   T score -1.2 FRAX 4.9% / 0.3%   PMB (postmenopausal bleeding)    Post-COVID chronic dyspnea    Rosacea    Thickened endometrium    Past Surgical History:  Procedure Laterality Date   ANTERIOR AND POSTERIOR REPAIR N/A 08/21/2020   Procedure: ANTERIOR (CYSTOCELE) AND POSTERIOR REPAIR (RECTOCELE);  Surgeon: Genia Del, MD;  [facility];  Service: Gynecology;  Laterality: N/A;  requesting 9:00am OR time  requests one hour   ANTERIOR CERVICAL DECOMP/DISCECTOMY FUSION N/A 09/27/2017   Procedure: ANTERIOR CERVICAL  DECOMPRESSION/DISCECTOMY FUSION CERVICAL FOUR-FIVE ,CERVICAL FIVE-SIX,CERVICAL SIX-SEVEN;  Surgeon: Donalee Citrin, MD;  Location: [facility] OR;  Service: Neurosurgery;  Laterality: N/A;   CARPAL TUNNEL RELEASE Left 09/27/2017   Procedure: CARPAL TUNNEL RELEASE;  Surgeon: Donalee Citrin, MD;  Location[facility] OR;  Service: Neurosurgery;  Laterality: Left;   DILATATION & CURETTAGE/HYSTEROSCOPY WITH MYOSURE N/A 06/03/2021   Procedure: DILATATION & CURETTAGE/HYSTEROSCOPY WITH MYOSURE;  Surgeon: Genia Del, MD;  Location: [facility];  Service: Gynecology;  Laterality: N/A;   FOOT SURGERY     bone removed from pinky toe   INGUINAL HERNIA REPAIR Right 1995   KNEE ARTHROSCOPY W/ MENISCAL REPAIR Left 06/21/2017   dr Netta Corrigan   LEG SURGERY  1972   MOHS SURGERY  2016   BCC of forehead   MOUTH SURGERY  2015   removal and cauterization pallete of cancerous lesion   TONSILLECTOMY  1979   TOTAL KNEE ARTHROPLASTY Left 01/26/2018   Procedure: LEFT TOTAL KNEE ARTHROPLASTY;  Surgeon: Ranee Gosselin, MD;  Location: [facility];  Service: Orthopedics;  Laterality: Left;   TUBAL LIGATION     Social History   Socioeconomic History   Marital status: Married    Spouse name: Not on file   Number of children: 6   Years of education: Not on file   Highest education level: Not on file  Occupational History   Occupation: retired    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 [employer]   Tobacco Use   Smoking status: Former    Packs/day: 0.50    Years: 35.00    Additional pack years: 0.00    Total pack years: 17.50    Types: Cigarettes    Quit date: 11/26/2009    Years since quitting: 13.4   Smokeless tobacco: Never  Vaping Use   Vaping Use: Never used  Substance and Sexual Activity   Alcohol use: Not Currently   Drug use: No   Sexual activity: Not Currently    Partners: Male    Birth control/protection: Post-menopausal    Comment: 1st intercourse 58 yo-5 partners, married- 25 yrs  Other Topics Concern   Not  on file  Social History Narrative   Lives with husband. Raising 2 grandchildren of deceased daughter.       Lives in one story ranch house.    Social Determinants of Health   Financial Resource Strain: Low Risk  (04/27/2023)   Overall Financial Resource Strain (CARDIA)    Difficulty of Paying Living Expenses: Not hard at all  Food Insecurity: No Food Insecurity (04/27/2023)   Hunger Vital Sign    Worried About Running Out of Food in the Last Year: Never true    Ran Out of Food in the Last Year: Never true  Transportation Needs: No Transportation Needs (04/27/2023)   PRAPARE - Administrator, Civil Service (Medical): No    Lack of Transportation (Non-Medical): No  Physical Activity: Sufficiently Active (04/27/2023)   Exercise Vital Sign    Days of Exercise per Week: 2 days    Minutes of Exercise per Session: 90 min  Stress: No Stress Concern Present (04/27/2023)   [employer] of Occupational Health - Occupational Stress Questionnaire    Feeling of Stress : Only a little  Social Connections: Moderately Isolated (04/27/2023)   Social Connection and Isolation Panel [NHANES]    Frequency of Communication with Friends and Family: More than three times a week    Frequency of Social Gatherings with Friends and Family: Never    Attends Religious Services: Never    Database administrator or Organizations: No    Attends Banker Meetings: Never    Marital Status: Married   Outpatient Encounter Medications as of 04/27/2023  Medication Sig   terconazole (TERAZOL 7) 0.4 % vaginal cream Place 1 applicator vaginally at bedtime.   albuterol (VENTOLIN HFA) 108 (90 Base) MCG/ACT inhaler Inhale 2 puffs into the lungs every 6 (six) hours as needed.   furosemide (LASIX) 40 MG tablet Take 1.5 tablets (60 mg total) by mouth daily.   ibuprofen (ADVIL) 800 MG tablet Take 1 tablet (800 mg total) by mouth 2 (two) times daily as needed for moderate pain.   No facility-administered  encounter medications on file as of 04/27/2023.   ALLERGIES: Allergies  Allergen Reactions   Influenza Vaccines Anaphylaxis and Other (See Comments)    Per patient   Penicillins Anaphylaxis and Other (See Comments)    PATIENT HAS HAD A PCN REACTION WITH IMMEDIATE RASH, FACIAL/TONGUE/THROAT SWELLING, SOB, OR LIGHTHEADEDNESS WITH HYPOTENSION:  #  #  #  YES  #  #  #   Has patient had a PCN reaction causing severe rash involving mucus membranes or skin necrosis: No PATIENT HAS HAD A PCN REACTION THAT REQUIRED HOSPITALIZATION:  #  #  #  YES  #  #  #  Has patient had a PCN reaction occurring within the  last 10 years: No    Articaine Other (See Comments)    Caused infection   Cortisone Other (See Comments)    Turned red and ran a low grade fever for 3 days   Other Rash    bandaids- skin turns red and a rash    VACCINATION STATUS: Immunization History  Administered Date(s) Administered   Moderna Sars-Covid-2 Vaccination 01/19/2020, 02/19/2020, 09/13/2020   Tdap 12/20/2020    HPI 62 yr old female with above medical history. She is here to follow-up for her history of multinodular goiter with repeat thyroid function tests.  -Her previous series of thyroid ultrasound readings between 2017 and 2022 showed similar findings.   She did not have any further thyroid ultrasound. -Fine-needle aspiration from November 2015 in [facility]  showed benign nodular goiter bethesda system category  II with no cytologic evidence of malignancy.  -Her previsit thyroid function tests are consistent with euthyroid presentation.  She has untreated severe dyslipidemia and recently had A1c of 5.9% consistent with prediabetes.   Her major concern today is  inability to lose weight.  She is not on any particular diet or exercise program.  Her weight is largely the same as her weight in August 2022. 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  Her a.m. cortisol in June 2024 was normal.  She is not on steroids. She denies exposure to neck radiation. She denies dysphagia, SOB, voice change. She denies family hx of thyroid cancer. She has no new complaints today.  Review of Systems Constitutional:  +  Weight gain, + fatigue, no subjective hyperthermia/hypothermia   Objective:    BP 128/76   Pulse 76   Ht 5\' 6"  (1.676 m)   Wt 249 lb (112.9 kg)   BMI 40.19 kg/m   Wt Readings from Last 3 Encounters:  04/27/23 249 lb (112.9 kg)  04/27/23 247 lb (112 kg)  04/23/23 245 lb 11.2 oz (111.4 kg)    Physical Exam Constitutional: + Obese, not in acute distress, normal state of mind. Eyes: PERRLA, EOMI, no exophthalmos Thyroid exam: Palpable nodular thyroid.  Labs from 11/27/2015 showed TSH and free T4 within normal range.  Thyroid ultrasound from Mar 27, 2015 from [place] was reviewed . Largest nodule on right lobe 1.7 cms .   Recent Results (from the past 2160 hour(s))  Urinalysis     Status: Abnormal   Collection Time: 04/06/23 11:06 AM  Result Value Ref Range   Specific Gravity, UA 1.020 1.005 - 1.030   pH, UA 7.0 5.0 - 7.5   Color, UA Yellow Yellow   Appearance Ur Clear Clear   Leukocytes,UA Negative Negative   Protein,UA Negative Negative/Trace   Glucose, UA Negative Negative   Ketones, UA Negative Negative   RBC, UA Trace (A) Negative   Bilirubin, UA Negative Negative   Urobilinogen, Ur 0.2 0.2 - 1.0 mg/dL   Nitrite, UA Negative Negative  ANA Comprehensive Panel     Status: None   Collection Time: 04/06/23 11:09 AM  Result Value Ref Range   dsDNA Ab <1 0 - 9 IU/mL    Comment:                                    Negative      <5  Equivocal  5 - 9                                    Positive      >9    ENA RNP Ab 0.2 0.0 - 0.9 AI   ENA SM Ab Ser-aCnc <0.2 0.0 - 0.9 AI   Scleroderma (Scl-70) (ENA) Antibody, IgG <0.2 0.0 - 0.9 AI   ENA SSA (RO) Ab <0.2  0.0 - 0.9 AI   ENA SSB (LA) Ab <0.2 0.0 - 0.9 AI   Chromatin Ab SerPl-aCnc <0.2 0.0 - 0.9 AI   Anti JO-1 <0.2 0.0 - 0.9 AI   Centromere Ab Screen <0.2 0.0 - 0.9 AI   See below: Comment     Comment: Autoantibody                       Disease Association ------------------------------------------------------------                         Condition                  Frequency ---------------------   ------------------------   --------- Antinuclear Antibody,    SLE, mixed connective Direct (ANA-D)           tissue diseases ---------------------   ------------------------   --------- dsDNA                    SLE                        40 - 60% ---------------------   ------------------------   --------- Chromatin                Drug induced SLE                90%                          SLE                        48 - 97% ---------------------   ------------------------   --------- SSA (Ro)                 SLE                        25 - 35%                          Sjogren's Syndrome         40 - 70%                          Neonatal Lupus                 100% ---------------------   ------------------------   --------- SSB (La)                 SLE                              10%  Sjogren's Syndrome              30% ---------------------   -----------------------    --------- Sm (anti-Smith)          SLE                        15 - 30% ---------------------   -----------------------    --------- RNP                      Mixed Connective Tissue                          Disease                         95% (U1 nRNP,                SLE                        30 - 50% anti-ribonucleoprotein)  Polymyositis and/or                          Dermatomyositis                 20% ---------------------   ------------------------   --------- Scl-70 (antiDNA          Scleroderma (diffuse)      20 - 35% topoisomerase)           Crest                            13% ---------------------   ------------------------   --------- Jo-1                     Polymyositis and/or                          Dermatomyositis            20 - 40% ---------------------   ------------------------   --------- Centromere B             Scleroderma -  Crest                          variant                         80%   Antiphospholipid Syndrome Comp     Status: Abnormal   Collection Time: 04/06/23 11:09 AM  Result Value Ref Range   APTT 29.4 sec    Comment: This test has not been validated for monitoring unfractionated heparin therapy.  aPTT-based therapeutic ranges for unfractionated heparin therapy have not been established.  Consider ordering Heparin anti-Xa (unfractionated). Reference Range: 18 years and older: 22.9 - 30.2    APTT 1:1 NP CANCELED sec    Comment: Testing Not Indicated This test was developed and its performance characteristics determined by [facility]. It has not been cleared or approved by the [place] Food and Drug Administration.  Result canceled by the ancillary.    APTT 1:1 Saline CANCELED sec    Comment: Testing Not Indicated This test was developed and its performance characteristics determined by [facility]. It has not been cleared or approved  by the [place] Food and Drug Administration.  Result canceled by the ancillary.    DRVVT Screen Seconds 35.0 sec    Comment: Reference Range: <= 47.0    DRVVT Confirm Seconds CANCELED sec    Comment: Testing Not Indicated  Result canceled by the ancillary.    DRVVT Ratio CANCELED ratio    Comment: Testing Not Indicated  Result canceled by the ancillary.    Hexagonal Phospholipid Neutral 4 sec    Comment: This value is NEGATIVE. This is a qualitative assay and is therefore reported as positive for lupus anticoagulant or negative.  The quantitative value is provided as an aid in diagnosis. Reference Range: 0 - 11    Platelet Neutralization 1.1 sec    Comment: Reference Range: 0.0 -  3.0 This test was developed and its performance characteristics determined by [facility]. It has not been cleared or approved by the Food and Drug Administration.    Anticardiolipin Ab, IgG <10 GPL    Comment: Reference Range: Negative: <15 Indeterminate: 15 - 20 Low to medium positive: >20 - 80 High positive: >80    Anticardiolipin Ab, IgM <10 MPL    Comment: Reference Range: Negative: <13 Indeterminate: 13 - 20 Low to medium positive: >20 - 80 High positive: >80    Anticardiolipin Ab, IgA <10 APL    Comment: Reference Range: Negative: <12 Indeterminate: 12 - 20 Low to medium positive: >20 - 80 High positive: >80    Beta-2 Glycoprotein I, IgG <10 SGU    Comment: The reference interval reflects a 3SD or 99th percentile interval. Reference Range: Negative: <21    Beta-2 Glycoprotein I, IgM <10 SMU    Comment: The reference interval reflects a 3SD or 99th percentile interval. Reference Range: Negative: <33    Beta-2 Glycoprotein I, IgA <10 SAU    Comment: The reference interval reflects a 3SD or 99th percentile interval. Reference Range: Negative: <26    Antiphosphatidylserine IgG 16 (H) GPS    Comment: Reference Range: Negative: <16 Low Positive: 16 - 30 Moderate Positive: 31 - 50 High Positive: >50    Antiphosphatidylserine IgM 10 MPS    Comment: Reference Range: Negative: <22 Low Positive: 22 - 35 Moderate Positive: 36 - 50 High Positive: >50    Antiprothrombin Antibody, IgG 1 G units    Comment: Reference Range: Negative: <21    LAC Interpretation Comment     Comment: A lupus anticoagulant is not detected. All ELISA-based antiphospholipid antibodies except for antiphosphatidylserine IgG are normal. Antiphosphatidylserine IgG is slightly elevated. As an isolated, low positive result, it is unlikely of clinical significance. As antibody titers may fluctuate with time repeat evaluation may be indicated. Please contact [facility] if further  clarification is needed.   CBC with Differential     Status: None   Collection Time: 04/06/23 11:09 AM  Result Value Ref Range   WBC 6.5 3.4 - 10.8 x10E3/uL   RBC 4.56 3.77 - 5.28 x10E6/uL   Hemoglobin 12.9 11.1 - 15.9 g/dL   Hematocrit 54.0 98.1 - 46.6 %   MCV 87 79 - 97 fL   MCH 28.3 26.6 - 33.0 pg   MCHC 32.4 31.5 - 35.7 g/dL   RDW 19.1 47.8 - 29.5 %   Platelets 343 150 - 450 x10E3/uL   Neutrophils 58 Not Estab. %   Lymphs 30 Not Estab. %   Monocytes 8 Not Estab. %   Eos 2 Not Estab. %   Basos 1 Not Estab. %  Neutrophils Absolute 3.9 1.4 - 7.0 x10E3/uL   Lymphocytes Absolute 1.9 0.7 - 3.1 x10E3/uL   Monocytes Absolute 0.5 0.1 - 0.9 x10E3/uL   EOS (ABSOLUTE) 0.1 0.0 - 0.4 x10E3/uL   Basophils Absolute 0.0 0.0 - 0.2 x10E3/uL   Immature Granulocytes 1 Not Estab. %   Immature Grans (Abs) 0.0 0.0 - 0.1 x10E3/uL  C-reactive protein     Status: Abnormal   Collection Time: 04/06/23 11:09 AM  Result Value Ref Range   CRP 16 (H) 0 - 10 mg/L  Sedimentation Rate     Status: None   Collection Time: 04/06/23 11:09 AM  Result Value Ref Range   Sed Rate 31 0 - 40 mm/hr  CMP14+EGFR     Status: Abnormal   Collection Time: 04/06/23 11:09 AM  Result Value Ref Range   Glucose 84 70 - 99 mg/dL   BUN 8 8 - 27 mg/dL   Creatinine, Ser 5.46 0.57 - 1.00 mg/dL   eGFR 78 >56 CL/EXN/1.70   BUN/Creatinine Ratio 10 (L) 12 - 28   Sodium 143 134 - 144 mmol/L   Potassium 5.0 3.5 - 5.2 mmol/L   Chloride 105 96 - 106 mmol/L   CO2 24 20 - 29 mmol/L   Calcium 9.7 8.7 - 10.3 mg/dL   Total Protein 6.7 6.0 - 8.5 g/dL   Albumin 4.4 3.9 - 4.9 g/dL   Globulin, Total 2.3 1.5 - 4.5 g/dL   Albumin/Globulin Ratio 1.9 1.2 - 2.2   Bilirubin Total 0.5 0.0 - 1.2 mg/dL   Alkaline Phosphatase 80 44 - 121 IU/L   AST 13 0 - 40 IU/L   ALT 23 0 - 32 IU/L  CoaguChek XS/INR Waived     Status: None   Collection Time: 04/06/23 11:16 AM  Result Value Ref Range   INR 1.0 0.9 - 1.1   Prothrombin Time 11.9 sec     Comment: Differences in reagents, instruments, and pre-analytical variables can affect prothrombin time results.  These factors should be considered when comparing different prothrombin time test methods. Please Note: This test should not be used to monitor persons on heparin therapy.   TSH     Status: None   Collection Time: 04/13/23  9:10 AM  Result Value Ref Range   TSH 1.850 0.450 - 4.500 uIU/mL  T4, free     Status: None   Collection Time: 04/13/23  9:10 AM  Result Value Ref Range   Free T4 1.13 0.82 - 1.77 ng/dL  T3, free     Status: None   Collection Time: 04/13/23  9:10 AM  Result Value Ref Range   T3, Free 3.2 2.0 - 4.4 pg/mL  Cortisol-am, blood     Status: None   Collection Time: 04/13/23  9:10 AM  Result Value Ref Range   Cortisol - AM 13.5 6.2 - 19.4 ug/dL  Basic metabolic panel     Status: Abnormal   Collection Time: 04/23/23  8:54 AM  Result Value Ref Range   Sodium 137 135 - 145 mmol/L   Potassium 3.8 3.5 - 5.1 mmol/L   Chloride 103 98 - 111 mmol/L   CO2 25 22 - 32 mmol/L   Glucose, Bld 125 (H) 70 - 99 mg/dL    Comment: Glucose reference range applies only to samples taken after fasting for at least 8 hours.   BUN 14 8 - 23 mg/dL   Creatinine, Ser 0.17 0.44 - 1.00 mg/dL   Calcium 9.2 8.9 - 10.3  mg/dL   GFR, Estimated >16 >10 mL/min    Comment: (NOTE) Calculated using the CKD-EPI Creatinine Equation (2021)    Anion gap 9 5 - 15    Comment: Performed at [facility], [address] [place] [address], [place], [place] [address]  D-dimer, quantitative     Status: Abnormal   Collection Time: 04/23/23  8:54 AM  Result Value Ref Range   D-Dimer, Quant 2.02 (H) 0.00 - 0.50 ug/mL-FEU    Comment: (NOTE) At the manufacturer cut-off value of 0.5 g/mL FEU, this assay has a negative predictive value of 95-100%.This assay is intended for use in conjunction with a clinical pretest probability (PTP) assessment model to exclude pulmonary embolism (PE) and deep venous thrombosis (DVT) in  outpatients suspected of PE or DVT. Results should be correlated with clinical presentation. Performed at [facility], [address], [place], [place] [address]   Antithrombin III     Status: None   Collection Time: 04/23/23  8:54 AM  Result Value Ref Range   AntiThromb III Func 103 75 - 120 %    Comment: Performed at [facility], [address], [place], [place] [address]  Protein S, total and free     Status: None   Collection Time: 04/23/23  8:54 AM  Result Value Ref Range   Protein S Ag, Total 86 60 - 150 %    Comment: (NOTE) This test was developed and its performance characteristics determined by [facility]. It has not been cleared or approved by the Food and Drug Administration.    Protein S Ag, Free 106 61 - 136 %    Comment: (NOTE) Performed At: [facility] [address] [place], [place] [id number] Jolene Schimke MD QM:[id number]   Protein S activity     Status: None   Collection Time: 04/23/23  8:54 AM  Result Value Ref Range   Protein S Activity 99 63 - 140 %    Comment: (NOTE) Protein S activity may be falsely increased (masking an abnormal, low result) in patients receiving direct Xa inhibitor (e.g., rivaroxaban, apixaban, edoxaban) or a direct thrombin inhibitor (e.g., dabigatran) anticoagulant treatment due to assay interference by these drugs. Performed At: [facility] [address] [place], [place] [id number] Jolene Schimke MD NU:[id number]   Protein C, total     Status: None   Collection Time: 04/23/23  8:54 AM  Result Value Ref Range   Protein C, Total 122 60 - 150 %    Comment: (NOTE) Performed At: [facility] [address] [place], [place] [id number] Jolene Schimke MD [id number]   Protein C activity     Status: None   Collection Time: 04/23/23  8:54 AM  Result Value Ref Range   Protein C Activity 145 73 - 180 %    Comment: (NOTE) Performed At: [facility] [address] [place], [place]  [id number] Jolene Schimke MD [id number]   ACTH     Status: None   Collection Time: 04/23/23  8:55 AM  Result Value Ref Range   C206 ACTH 8.8 7.2 - 63.3 pg/mL    Comment: (NOTE) ACTH reference interval for samples collected between 7 and 10 AM. Performed At: [facility] [address] [place], [place] [id number] Jolene Schimke MD HC:[id number]   Cortisol     Status: None   Collection Time: 04/23/23  8:55 AM  Result Value Ref Range   Cortisol, Plasma 9.2 ug/dL    Comment: (NOTE) AM    6.7 - 22.6  ug/dL PM   <41.3       ug/dL Performed at [facility], [address] [place], [place] [address]      Her repeat thyroid ultrasound on 03/26/2016 at [facility] radiology showed unchanged sonogram of the thyroid with 1.7 and 1.1 cm nodules.  Fine-needle aspiration on 09/28/2014 on her right lobe nodule   in [facility] which showed abundant normal follicular cells and a few clusters of intact normal follicles, a small component of lymphocytes mixed with isolated Hurthle cells.  June 22, 2018 thyroid ultrasound shows right lobe 5.3 cm, increasing from 5 cm with 2 nodules stable 1.7 cm nodule previously biopsied, stable 1.1 cm nodule not biopsied.  Left lobe measures 4.3 cm, increasing from 4 cm with new 0.4 cm nodule.   Thyroid ultrasound on June 24, 2020  Right lobe 5.5 cm, left lobe 4.9 cm IMPRESSION: 1. Confirmed 1 year stability of previously identified thyroid nodules. No interval growth or development of suspicious features. 2. Approximately 1.1 cm TI-RADS category 4 nodule in the right superior gland continues to meet criteria for annual observation. Recommend repeat ultrasound in 1 year. 3. Approximately 1.7 cm TI-RADS category 3 nodule in the right mid gland continues to meet criteria for annual observation. Recommend follow-up in 1 year.  Repeat, previsit thyroid ultrasound in September 2022 showed similar or unchanged  findings.  From [facility] system CT abdomen pelvis without contrast in May 2024 IMPRESSION:  High-density enlargement of the right adrenal gland with stranding  most compatible with adrenal hemorrhage. This continues along the  superior margin of the right kidney.   No urinary tract stones or hydronephrosis.  aortic atherosclerosis.    Assessment & Plan:   1. Multiple thyroid nodules/Nontoxic MNG 2.  Obesity 3.  Dyslipidemia -She continues to have euthyroid multinodular goiter.  Based on her prior thyroid ultrasound imagings she did not need repeat biopsy of her thyroid nodules.  However, she will need repeat surveillance thyroid ultrasound before her next visit.   Her recent a.m. 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  She will have repeat a.m. cortisol before her next visit. Regarding her metabolic syndrome including dyslipidemia, prediabetes, obesity, she remains a good candidate for lifestyle medicine.  She would like to avoid medications for lipid management.  Her most recent lipid panel showed LDL of 134.  - she acknowledges that there is a room for improvement in her food and drink choices. - Suggestion is made for her to avoid simple carbohydrates  from her diet including Cakes, Sweet Desserts, Ice Cream, Soda (diet and regular), Sweet Tea, Candies, Chips, Cookies, Store Bought Juices, Alcohol , Artificial Sweeteners,  Coffee Creamer, and "Sugar-free" Products, Lemonade. This will help patient to have more stable blood glucose profile and potentially avoid unintended weight gain.  The following Lifestyle Medicine recommendations according to American College of Lifestyle Medicine  [facility]) were discussed and and offered to patient and she  agrees to start the journey:  A. Whole Foods, Plant-Based Nutrition comprising of fruits and vegetables, plant-based proteins, whole-grain carbohydrates was discussed in  detail with the patient.   A list for source of those nutrients were also provided to the patient.  Patient will use only water or unsweetened tea for hydration. B.  The need to stay away from risky substances including alcohol, smoking; obtaining 7 to 9 hours of restorative sleep, at least 150 minutes of moderate intensity exercise  weekly, the importance of healthy social connections,  and stress management techniques were discussed. C.  A full color page of  Calorie density of various food groups per pound showing examples of each food groups was provided to the patient.   - I advised patient to maintain close follow up with [name], DO for primary care needs.    I spent  25  minutes in the care of the patient today including review of labs from Thyroid Function, CMP, and other relevant labs ; imaging/biopsy records (current and previous including abstractions from other facilities); face-to-face time discussing  her lab results and symptoms, medications doses, her options of short and long term treatment based on the latest standards of care / guidelines;   and documenting the encounter.  Miranda Perkins  participated in the discussions, expressed understanding, and voiced agreement with the above plans.  All questions were answered to her satisfaction. she is encouraged to contact clinic should she have any questions or concerns prior to her return visit.    Follow up plan: Return in about 8 weeks (around 06/22/2023) for Fasting Labs  in AM B4 8, A1c -[place].  Marquis Lunch, MD Phone: [phone]  Fax: [phone]   04/27/2023, 3:39 PM

## 2023-04-29 LAB — SURGICAL PATHOLOGY

## 2023-05-05 LAB — JAK2 V617F RFX CALR/MPL/E12-15

## 2023-05-05 LAB — CALR +MPL + E12-E15  (REFLEX)

## 2023-05-05 LAB — PROTHROMBIN GENE MUTATION

## 2023-05-07 ENCOUNTER — Other Ambulatory Visit: Payer: Self-pay

## 2023-05-07 ENCOUNTER — Encounter (HOSPITAL_BASED_OUTPATIENT_CLINIC_OR_DEPARTMENT_OTHER): Payer: Self-pay | Admitting: Emergency Medicine

## 2023-05-07 ENCOUNTER — Other Ambulatory Visit (HOSPITAL_BASED_OUTPATIENT_CLINIC_OR_DEPARTMENT_OTHER): Payer: Self-pay

## 2023-05-07 ENCOUNTER — Emergency Department (HOSPITAL_BASED_OUTPATIENT_CLINIC_OR_DEPARTMENT_OTHER)

## 2023-05-07 ENCOUNTER — Emergency Department (HOSPITAL_BASED_OUTPATIENT_CLINIC_OR_DEPARTMENT_OTHER)
Admission: EM | Admit: 2023-05-07 | Discharge: 2023-05-07 | Disposition: A | Discharge status: Home / Self Care | Attending: Emergency Medicine | Admitting: Emergency Medicine

## 2023-05-07 DIAGNOSIS — Z85819 Personal history of malignant neoplasm of unspecified site of lip, oral cavity, and pharynx: Secondary | ICD-10-CM | POA: Diagnosis not present

## 2023-05-07 DIAGNOSIS — E2749 Other adrenocortical insufficiency: Secondary | ICD-10-CM | POA: Diagnosis not present

## 2023-05-07 DIAGNOSIS — R109 Unspecified abdominal pain: Secondary | ICD-10-CM | POA: Diagnosis present

## 2023-05-07 DIAGNOSIS — E669 Obesity, unspecified: Secondary | ICD-10-CM | POA: Diagnosis not present

## 2023-05-07 LAB — URINALYSIS, ROUTINE W REFLEX MICROSCOPIC
Bilirubin Urine: NEGATIVE
Glucose, UA: NEGATIVE mg/dL
Hgb urine dipstick: NEGATIVE
Ketones, ur: NEGATIVE mg/dL
Leukocytes,Ua: NEGATIVE
Nitrite: NEGATIVE
Protein, ur: NEGATIVE mg/dL
Specific Gravity, Urine: 1.009 (ref 1.005–1.030)
pH: 6.5 (ref 5.0–8.0)

## 2023-05-07 LAB — CBC WITH DIFFERENTIAL/PLATELET
Abs Immature Granulocytes: 0.03 10*3/uL (ref 0.00–0.07)
Basophils Absolute: 0 10*3/uL (ref 0.0–0.1)
Basophils Relative: 0 %
Eosinophils Absolute: 0.1 10*3/uL (ref 0.0–0.5)
Eosinophils Relative: 1 %
HCT: 41.1 % (ref 36.0–46.0)
Hemoglobin: 13.8 g/dL (ref 12.0–15.0)
Immature Granulocytes: 0 %
Lymphocytes Relative: 25 %
Lymphs Abs: 2.2 10*3/uL (ref 0.7–4.0)
MCH: 29 pg (ref 26.0–34.0)
MCHC: 33.6 g/dL (ref 30.0–36.0)
MCV: 86.3 fL (ref 80.0–100.0)
Monocytes Absolute: 0.8 10*3/uL (ref 0.1–1.0)
Monocytes Relative: 9 %
Neutro Abs: 5.8 10*3/uL (ref 1.7–7.7)
Neutrophils Relative %: 65 %
Platelets: 353 10*3/uL (ref 150–400)
RBC: 4.76 MIL/uL (ref 3.87–5.11)
RDW: 13.2 % (ref 11.5–15.5)
WBC: 8.9 10*3/uL (ref 4.0–10.5)
nRBC: 0 % (ref 0.0–0.2)

## 2023-05-07 LAB — HEPATIC FUNCTION PANEL
ALT: 18 U/L (ref 0–44)
AST: 13 U/L — ABNORMAL LOW (ref 15–41)
Albumin: 4.6 g/dL (ref 3.5–5.0)
Alkaline Phosphatase: 72 U/L (ref 38–126)
Bilirubin, Direct: 0.1 mg/dL (ref 0.0–0.2)
Indirect Bilirubin: 0.7 mg/dL (ref 0.3–0.9)
Total Bilirubin: 0.8 mg/dL (ref 0.3–1.2)
Total Protein: 7.6 g/dL (ref 6.5–8.1)

## 2023-05-07 LAB — BASIC METABOLIC PANEL
Anion gap: 12 (ref 5–15)
BUN: 14 mg/dL (ref 8–23)
CO2: 24 mmol/L (ref 22–32)
Calcium: 10.2 mg/dL (ref 8.9–10.3)
Chloride: 102 mmol/L (ref 98–111)
Creatinine, Ser: 0.78 mg/dL (ref 0.44–1.00)
GFR, Estimated: >60 mL/min (ref >60)
Glucose, Bld: 106 mg/dL — ABNORMAL HIGH (ref 70–99)
Potassium: 4 mmol/L (ref 3.5–5.1)
Sodium: 138 mmol/L (ref 135–145)

## 2023-05-07 LAB — LIPASE, BLOOD: Lipase: 10 U/L — ABNORMAL LOW (ref 11–51)

## 2023-05-07 MED ORDER — IOHEXOL 300 MG/ML  SOLN
100.0000 mL | Freq: Once | INTRAMUSCULAR | Status: AC | PRN
  Administered 2023-05-07: 100 mL via INTRAVENOUS

## 2023-05-07 MED ORDER — TRAMADOL HCL 50 MG PO TABS: 50.0000 mg | ORAL_TABLET | Freq: Four times a day (QID) | ORAL | 0 refills | Status: DC | PRN

## 2023-05-07 MED ORDER — ACETAMINOPHEN 325 MG PO TABS: 650.0000 mg | ORAL_TABLET | Freq: Four times a day (QID) | ORAL | 0 refills | Status: DC | PRN

## 2023-05-07 MED ORDER — IBUPROFEN 600 MG PO TABS: 600.0000 mg | ORAL_TABLET | Freq: Four times a day (QID) | ORAL | 0 refills | Status: DC | PRN

## 2023-05-07 MED ORDER — FAMOTIDINE IN NACL 20-0.9 MG/50ML-% IV SOLN
20.0000 mg | Freq: Once | INTRAVENOUS | Status: AC
  Administered 2023-05-07: 20 mg via INTRAVENOUS
  Filled 2023-05-07: qty 50

## 2023-05-07 MED ORDER — DICYCLOMINE HCL 20 MG PO TABS: 20.0000 mg | ORAL_TABLET | Freq: Two times a day (BID) | ORAL | 0 refills | Status: DC

## 2023-05-07 MED ORDER — KETOROLAC TROMETHAMINE 15 MG/ML IJ SOLN
15.0000 mg | Freq: Once | INTRAMUSCULAR | Status: AC
  Administered 2023-05-07: 15 mg via INTRAVENOUS
  Filled 2023-05-07: qty 1

## 2023-05-07 MED ORDER — SODIUM CHLORIDE 0.9 % IV BOLUS
1000.0000 mL | Freq: Once | INTRAVENOUS | Status: AC
  Administered 2023-05-07: 1000 mL via INTRAVENOUS

## 2023-05-07 NOTE — ED Notes (Signed)
Pt discharged to home using teachback Method. Discharge instructions have been discussed with patient and/or family members. Pt verbally acknowledges understanding d/c instructions, has been given opportunity for questions to be answered, and endorses comprehension to checkout at registration before leaving.  

## 2023-05-07 NOTE — ED Triage Notes (Signed)
Pt presents to ED POV. Pt c/o R flank pain. Pt reports that pain is a constand and 8/10. Pt denies GI/GU s/s. Report that she was admitted to [facility] in May for an adrenal bleed and states feels similar.

## 2023-05-07 NOTE — Discharge Instructions (Addendum)
It was a pleasure caring for you today in the emergency department.  Please return to the emergency department for any worsening or worrisome symptoms.  Please follow up with your PCP in the next few days for recheck

## 2023-05-07 NOTE — ED Notes (Signed)
Patient transported to CT/XR ?

## 2023-05-07 NOTE — ED Provider Notes (Signed)
[place] EMERGENCY DEPARTMENT AT [facility] Provider Note  CSN: [id number] Arrival date & time: 05/07/23 1213  Chief Complaint(s) Flank Pain  HPI Miranda Perkins is a 62 y.o. female with past medical history as below, significant for adrenal hemorrhage spontaneous resolution, thyroid nodules, arthritis, prediabetes, obesity, exertional dyspnea who presents to the ED with complaint of right-sided flank pain x 1 week.  Patient with right-sided sharp, stabbing pain gradually worsening over the past week.  Right flank and somewhat radiates to her right back.  Nausea without vomiting.  No change in bowel or bladder function.  No fevers or chills, no rashes.  No abnormal vaginal bleeding or discharge.  She was admitted recently [facility] with spontaneous dural hemorrhage which resolved spontaneously and was discharged in stable condition 03/28/23.  Is been having intermittent right flank pain since then.  Some pain with a full inspiration on that same right side as well.  No cough, no abnormal swelling to her extremities  Took Motrin and Mucinex last night which did significant improve her symptoms  Past Medical History Past Medical History:  Diagnosis Date   Adrenal hemorrhage (HCC)    Arthritis    Edema of both lower extremities    Endometrial polyp    Hirsutism    History of 2019 novel coronavirus disease (COVID-19) 12/21/2020   positive home result documented in pcp note in epic 12-23-2020,  residual doe, pulmonology consult w/ dr wert note in epic 03-20-2021   History of basal cell carcinoma (BCC) excision 2016   forehead, per pt no recurrence   History of CVA (cerebrovascular accident) without residual deficits 1993   pt stated , while on Chloramphenicol, no residuaL; [medication given to treat tick bite] ; patient states  "i stroked out right next to my doctor , he said i passed out and that was my only symptom" ;  no resiudal   History of mouth cancer 2015   per pt surgically  removal and cauterized palette , 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 branch,  work-up results in epic 03/ 2022 (normal nuclear stress test, normal echo, no arrhythmia's per event monitor);   (05-28-2021 per pt no symptoms since 03/ 2022)   Multiple thyroid nodules    endocrinologist--- dr g. nida,  hx benign bx, clinically euthyroid   Osteopenia 12/2017   T score -1.2 FRAX 4.9% / 0.3%   PMB (postmenopausal bleeding)    Post-COVID chronic dyspnea    Rosacea    Thickened endometrium    Patient Active Problem List   Diagnosis Date Noted   Prediabetes 04/27/2023   Adrenal hemorrhage (HCC) 04/23/2023   Mixed hyperlipidemia 12/29/2021   Class 3 severe obesity due to excess calories with serious comorbidity and body mass index (BMI) of 40.0 to 44.9 in adult (HCC) 12/29/2021   DOE (dyspnea on exertion) 03/20/2021   Upper airway cough syndrome vs cough variant asthma 03/20/2021   Elevated testosterone level in female 12/20/2020   Female hirsutism 12/20/2020   Postoperative state 08/21/2020   Post-operative state 08/21/2020   Hx of total knee arthroplasty, left 01/26/2018   Spinal stenosis of cervical region 09/27/2017   Numbness and tingling in both hands 09/14/2016   Insomnia 09/14/2016   BMI 39.0-39.9,adult 09/14/2016   Depression 11/28/2015   Leg swelling 11/28/2015   Nontoxic multinodular goiter 11/28/2015   Vitamin B 12 deficiency 11/28/2015   Rosacea 11/28/2015  Home Medication(s) Prior to Admission medications   Medication Sig Start Date End Date Taking? Authorizing Provider  albuterol (VENTOLIN HFA) 108 (90 Base) MCG/ACT inhaler Inhale 2 puffs into the lungs every 6 (six) hours as needed. 04/15/23   Daphine Deutscher Mary-Margaret, FNP  furosemide (LASIX) 40 MG tablet Take 1.5 tablets (60 mg total) by mouth daily. 08/24/22   Antoine Poche, MD  ibuprofen (ADVIL) 800 MG tablet Take 1 tablet (800 mg total) by  mouth 2 (two) times daily as needed for moderate pain. 08/19/22   [name], DO  terconazole (TERAZOL 7) 0.4 % vaginal cream Place 1 applicator vaginally at bedtime. 04/27/23   Lazaro Arms, MD                                                                                                                                    Past Surgical History Past Surgical History:  Procedure Laterality Date   ANTERIOR AND POSTERIOR REPAIR N/A 08/21/2020   Procedure: ANTERIOR (CYSTOCELE) AND POSTERIOR REPAIR (RECTOCELE);  Surgeon: Genia Del, MD;  Location: [facility];  Service: Gynecology;  Laterality: N/A;  requesting 9:00am OR time  requests one hour   ANTERIOR CERVICAL DECOMP/DISCECTOMY FUSION N/A 09/27/2017   Procedure: ANTERIOR CERVICAL DECOMPRESSION/DISCECTOMY FUSION CERVICAL FOUR-FIVE ,CERVICAL FIVE-SIX,CERVICAL SIX-SEVEN;  Surgeon: Donalee Citrin, MD;  Location: [facility] OR;  Service: Neurosurgery;  Laterality: N/A;   CARPAL TUNNEL RELEASE Left 09/27/2017   Procedure: CARPAL TUNNEL RELEASE;  Surgeon: Donalee Citrin, MD;  Location: [facility] OR;  Service: Neurosurgery;  Laterality: Left;   DILATATION & CURETTAGE/HYSTEROSCOPY WITH MYOSURE N/A 06/03/2021   Procedure: DILATATION & CURETTAGE/HYSTEROSCOPY WITH MYOSURE;  Surgeon: Genia Del, MD;  Location: [facility];  Service: Gynecology;  Laterality: N/A;   FOOT SURGERY     bone removed from pinky toe   INGUINAL HERNIA REPAIR Right 1995   KNEE ARTHROSCOPY W/ MENISCAL REPAIR Left 06/21/2017   dr Netta Corrigan   LEG SURGERY  1972   MOHS SURGERY  2016   BCC of forehead   MOUTH SURGERY  2015   removal and cauterization pallete of cancerous lesion   TONSILLECTOMY  1979   TOTAL KNEE ARTHROPLASTY Left 01/26/2018   Procedure: LEFT TOTAL KNEE ARTHROPLASTY;  Surgeon: Ranee Gosselin, MD;  Location: [facility];  Service: Orthopedics;  Laterality: Left;   TUBAL LIGATION     Family History Family History  Problem Relation Age of  Onset   Cancer Paternal Grandfather        Brain cancer   Liver cancer Father    Heart Problems Mother    Rheum arthritis Mother    COPD Mother    Rheum arthritis Sister    Clotting disorder Sister    Rheum arthritis Sister    Thyroid disease Sister    Clotting disorder Maternal Aunt    Clotting disorder Maternal Aunt     Social History Social History  Tobacco Use   Smoking status: Former    Packs/day: 0.50    Years: 35.00    Additional pack years: 0.00    Total pack years: 17.50    Types: Cigarettes    Quit date: 11/26/2009    Years since quitting: 13.4   Smokeless tobacco: Never  Vaping Use   Vaping Use: Never used  Substance Use Topics   Alcohol use: Not Currently   Drug use: No   Allergies Influenza vaccines, Penicillins, Articaine, Cortisone, and Other  Review of Systems Review of Systems  Constitutional:  Negative for activity change and fever.  HENT:  Negative for facial swelling and trouble swallowing.   Eyes:  Negative for discharge and redness.  Respiratory:  Positive for shortness of breath. Negative for cough.   Cardiovascular:  Negative for chest pain and palpitations.  Gastrointestinal:  Positive for abdominal pain and nausea. Negative for vomiting.  Genitourinary:  Negative for dysuria and flank pain.  Musculoskeletal:  Negative for back pain and gait problem.  Skin:  Negative for pallor and rash.  Neurological:  Negative for syncope and headaches.    Physical Exam Vital Signs  I have reviewed the triage vital signs BP 138/81 (BP Location: Right Arm)   Pulse 84   Temp 98.1 F (36.7 C)   Resp 20   SpO2 98%  Physical Exam Vitals and nursing note reviewed.  Constitutional:      General: She is not in acute distress.    Appearance: Normal appearance. She is obese. She is not ill-appearing.  HENT:     Head: Normocephalic and atraumatic.     Right Ear: External ear normal.     Left Ear: External ear normal.     Nose: Nose normal.      Mouth/Throat:     Mouth: Mucous membranes are moist.  Eyes:     General: No scleral icterus.       Right eye: No discharge.        Left eye: No discharge.  Cardiovascular:     Rate and Rhythm: Normal rate and regular rhythm.     Pulses: Normal pulses.     Heart sounds: Normal heart sounds.  Pulmonary:     Effort: Pulmonary effort is normal. No respiratory distress.     Breath sounds: Normal breath sounds. No stridor. No rales.  Abdominal:     General: Abdomen is flat.     Palpations: Abdomen is soft.     Tenderness: There is abdominal tenderness. There is no guarding or rebound.       Comments: Nonperitoneal  Musculoskeletal:     Right lower leg: No edema.     Left lower leg: No edema.  Skin:    General: Skin is warm and dry.     Capillary Refill: Capillary refill takes less than 2 seconds.  Neurological:     Mental Status: She is alert.  Psychiatric:        Mood and Affect: Mood normal.        Behavior: Behavior normal.     ED Results and Treatments Labs (all labs ordered are listed, but only abnormal results are displayed) Labs Reviewed  BASIC METABOLIC PANEL - Abnormal; Notable for the following components:      Result Value   Glucose, Bld 106 (*)    All other components within normal limits  CBC WITH DIFFERENTIAL/PLATELET  URINALYSIS, ROUTINE W REFLEX MICROSCOPIC  HEPATIC FUNCTION PANEL  LIPASE, BLOOD  Radiology No results found.  Pertinent labs & imaging results that were available during my care of the patient were reviewed by me and considered in my medical decision making (see MDM for details).  Medications Ordered in ED Medications  ketorolac (TORADOL) 15 MG/ML injection 15 mg (has no administration in time range)  sodium chloride 0.9 % bolus 1,000 mL (has no administration in time range)  famotidine (PEPCID) IVPB 20 mg premix (has  no administration in time range)                                                                                                                                     Procedures Procedures  (including critical care time)  Medical Decision Making / ED Course    Medical Decision Making:    TASIA FRANTOM is a 62 y.o. female with past medical history as below, significant for adrenal hemorrhage spontaneous resolution, thyroid nodules, arthritis, prediabetes, obesity, exertional dyspnea who presents to the ED with complaint of right-sided flank pain x 1 week.. The complaint involves an extensive differential diagnosis and also carries with it a high risk of complications and morbidity.  Serious etiology was considered. Ddx includes but is not limited to: Differential diagnosis includes but is not exclusive to acute cholecystitis, intrathoracic causes for epigastric abdominal pain, gastritis, duodenitis, pancreatitis, small bowel or large bowel obstruction, abdominal aortic aneurysm, hernia, gastritis, etc. Differential diagnosis includes but is not exclusive to ectopic pregnancy, ovarian cyst, ovarian torsion, acute appendicitis, urinary tract infection, endometriosis, bowel obstruction, hernia, colitis, renal colic, gastroenteritis, volvulus etc.   Complete initial physical exam performed, notably the patient  was NAD, no hypoxia, abd not peritoneal.    Reviewed and confirmed nursing documentation for past medical history, family history, social history.  Vital signs reviewed.        Additional history obtained: -Additional history obtained from {wsadditionalhistorian:28072} -External records from outside source obtained and reviewed including: Chart review including previous notes, labs, imaging, consultation notes including ***   Lab Tests: -I ordered, reviewed, and interpreted labs.   The pertinent results include:   Labs Reviewed  BASIC METABOLIC PANEL - Abnormal; Notable for the  following components:      Result Value   Glucose, Bld 106 (*)    All other components within normal limits  CBC WITH DIFFERENTIAL/PLATELET  URINALYSIS, ROUTINE W REFLEX MICROSCOPIC  HEPATIC FUNCTION PANEL  LIPASE, BLOOD    Notable for ***  EKG   EKG Interpretation Date/Time:    Ventricular Rate:    PR Interval:    QRS Duration:    QT Interval:    QTC Calculation:   R Axis:      Text Interpretation:           Imaging Studies ordered: I ordered imaging studies including *** I independently visualized the following imaging with scope of interpretation limited to determining acute life threatening conditions related to  emergency care; findings noted above, significant for *** I independently visualized and interpreted imaging. I agree with the radiologist interpretation   Medicines ordered and prescription drug management: Meds ordered this encounter  Medications   ketorolac (TORADOL) 15 MG/ML injection 15 mg   sodium chloride 0.9 % bolus 1,000 mL   famotidine (PEPCID) IVPB 20 mg premix    -I have reviewed the patients home medicines and have made adjustments as needed   Consultations Obtained: I requested consultation with the ***,  and discussed lab and imaging findings as well as pertinent plan - they recommend: ***   Cardiac Monitoring: The patient was maintained on a cardiac monitor.  I personally viewed and interpreted the cardiac monitored which showed an underlying rhythm of: ***  Social Determinants of Health:  Diagnosis or treatment significantly limited by social determinants of health: {wssoc:28071}   Reevaluation: After the interventions noted above, I reevaluated the patient and found that they have {resolved/improved/worsened:23923::"improved"}  Co morbidities that complicate the patient evaluation  Past Medical History:  Diagnosis Date   Adrenal hemorrhage (HCC)    Arthritis    Edema of both lower extremities    Endometrial polyp     Hirsutism    History of 2019 novel coronavirus disease (COVID-19) 12/21/2020   positive home result documented in pcp note in epic 12-23-2020,  residual doe, pulmonology consult w/ dr wert note in epic 03-20-2021   History of basal cell carcinoma (BCC) excision 2016   forehead, per pt no recurrence   History of CVA (cerebrovascular accident) without residual deficits 1993   pt stated , while on Chloramphenicol, no residuaL; [medication given to treat tick bite] ; patient states  "i stroked out right next to my doctor , he said i passed out and that was my only symptom" ;  no resiudal   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 branch,  work-up results in epic 03/ 2022 (normal nuclear stress test, normal echo, no arrhythmia's per event monitor);   (05-28-2021 per pt no symptoms since 03/ 2022)   Multiple thyroid nodules    endocrinologist--- dr g. nida,  hx benign bx, clinically euthyroid   Osteopenia 12/2017   T score -1.2 FRAX 4.9% / 0.3%   PMB (postmenopausal bleeding)    Post-COVID chronic dyspnea    Rosacea    Thickened endometrium       Dispostion: Disposition decision including need for hospitalization was considered, and patient {wsdispo:28070::"discharged from emergency department."}    Final Clinical Impression(s) / ED Diagnoses Final diagnoses:  None     This chart was dictated using voice recognition software.  Despite best efforts to proofread,  errors can occur which can change the documentation meaning.

## 2023-05-11 ENCOUNTER — Ambulatory Visit (HOSPITAL_COMMUNITY)
Admission: RE | Admit: 2023-05-11 | Discharge: 2023-05-11 | Disposition: A | Discharge status: Home / Self Care | Source: Ambulatory Visit | Attending: "Endocrinology | Admitting: "Endocrinology

## 2023-05-11 DIAGNOSIS — E042 Nontoxic multinodular goiter: Secondary | ICD-10-CM | POA: Diagnosis not present

## 2023-05-13 LAB — FACTOR 5 LEIDEN

## 2023-05-17 NOTE — Progress Notes (Signed)
[facility] [address], [place] [address]   Clinic Day:  05/18/2023  Referring physician: [name], DO  Patient Care Team: [name], DO as PCP - General (Family Medicine) [name] [name], MD as PCP - Cardiology (Cardiology)   ASSESSMENT & PLAN:   Assessment:  1.  Adrenal hemorrhage: - She suddenly developed right-sided back pain 2 hours prior to presentation to ER on 03/28/2023. - CT AP (03/28/2023): High density enlargement of the right adrenal gland with stranding most compatible with adrenal hemorrhage.  This continues along the superior margin of the right kidney. - No prior history of thrombosis or bleeding.  She had D&C/hysteroscopy in 2022 and dental extraction a year ago and inguinal hernia repair in 1995 without any excessive bleeding.  She is not on blood thinners. - She had 1 miscarriage in the first trimester. - Hypercoagulable testing: Negative lupus anticoagulant/antibeta beta-2 glycoprotein 1 antibodies/anticardiolipin antibodies.  Antiphosphatidylserine IgG antibody was minimally elevated at 16, not significant.  JAK2 V617F with reflex testing is negative.  Factor V Leiden and prothrombin gene mutations are negative.  Protein C, protein S and Antithrombin III were normal.   2.  Social/family history: - Lives with husband at home.  Independent of ADLs and IADLs.  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  Quit cigarette smoking in 2010.  Smoked half pack per day for 15 years. - No family history of bleeding.  Mother had 3 miscarriages and 5 live births.  No family history of antiphospholipid syndrome.  Father and paternal grandfather had cancer.  Maternal first cousin had blood cancer.  Plan:  1.  Adrenal hemorrhage: - We have done hypercoagulable testing.  We discussed results of protein C, protein S,  Antithrombin III, factor V Leiden, prothrombin gene and JAK2 testing results which were negative. - She was again evaluated on 05/07/2023 in the ER for worsening pain. - CTAP on 05/07/2023: Right adrenal gland enlarged measuring 7.6 cm, previously 4.9 cm.  Areas of central decreased attenuation are noted likely related to resolving hemorrhage.  No acute increased attenuation to suggest active hemorrhage at this time. - She reports worsening pain.  She is taking ibuprofen 800 mg multiple times a day. - I will start her on hydrocodone 5/325 every 6 hours as needed #60. - Recommend follow-up in 6 weeks to reevaluate. - If there is any worsening of pain, she will call us.  I will refer to interventional radiology/surgery.   No orders of the defined types were placed in this encounter.      Alben Deeds Teague,acting as a Neurosurgeon for [name], MD.,have documented all relevant documentation on the behalf of [name], MD,as directed by  [name], MD while in the presence of [name], MD.  I, [name] MD, have reviewed the above documentation for accuracy and completeness, and I agree with the above.    [name], MD   7/16/20246:09 PM  CHIEF COMPLAINT/PURPOSE OF CONSULT:   Diagnosis: positive antiphospholipid antibody  Current Therapy: Under workup  HISTORY OF PRESENT ILLNESS:   Miranda Perkins is a 62 y.o. female presenting to clinic today for Follow up of positive antiphospholipid antibody and adrenal hemorrhage. She was last seen by me on 04/23/23.   Since her last visit, she was admitted to the ED on 7/5 for right-sided flank pain  radiating to her right back. CT abd/pelvis found interval increase in size of the right adrenal gland when compared with the prior exam, a large area of decreased attenuation, no areas of active hemorrhage, diverticulosis without diverticulitis, and fatty liver. She improved while in the ED and was discharged  in stable condition on 7/5.  Today, she states that she is doing well overall. Her appetite level is at 25%. Her energy level is at 10%.  PAST MEDICAL HISTORY:   Past Medical History: Past Medical History:  Diagnosis Date   Adrenal hemorrhage (HCC)    Arthritis    Edema of both lower extremities    Endometrial polyp    Hirsutism    History of 2019 novel coronavirus disease (COVID-19) 12/21/2020   positive home result documented in pcp note in epic 12-23-2020,  residual doe, pulmonology consult w/ dr wert note in epic 03-20-2021   History of basal cell carcinoma (BCC) excision 2016   forehead, per pt no recurrence   History of CVA (cerebrovascular accident) without residual deficits 1993   pt stated , while on Chloramphenicol, no residuaL; [medication given to treat tick bite] ; patient states  "i stroked out right next to my doctor , he said i passed out and that was my only symptom" ;  no resiudal   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 branch,  work-up results in epic 03/ 2022 (normal nuclear stress test, normal echo, no arrhythmia's per event monitor);   (05-28-2021 per pt no symptoms since 03/ 2022)   Multiple thyroid nodules    endocrinologist--- dr g. nida,  hx benign bx, clinically euthyroid   Osteopenia 12/2017   T score -1.2 FRAX 4.9% / 0.3%   PMB (postmenopausal bleeding)    Post-COVID chronic dyspnea    Rosacea    Thickened endometrium     Surgical History: Past Surgical History:  Procedure Laterality Date   ANTERIOR AND POSTERIOR REPAIR N/A 08/21/2020   Procedure: ANTERIOR (CYSTOCELE) AND POSTERIOR REPAIR (RECTOCELE);  Surgeon: Genia Del, MD;  Location: [facility];  Service: Gynecology;  Laterality: N/A;  requesting 9:00am OR time  requests one hour   ANTERIOR CERVICAL DECOMP/DISCECTOMY  FUSION N/A 09/27/2017   Procedure: ANTERIOR CERVICAL DECOMPRESSION/DISCECTOMY FUSION CERVICAL FOUR-FIVE ,CERVICAL FIVE-SIX,CERVICAL SIX-SEVEN;  Surgeon: Donalee Citrin, MD;  Location: [facility] OR;  Service: Neurosurgery;  Laterality: N/A;   CARPAL TUNNEL RELEASE Left 09/27/2017   Procedure: CARPAL TUNNEL RELEASE;  Surgeon: Donalee Citrin, MD;  Location: [facility] OR;  Service: Neurosurgery;  Laterality: Left;   DILATATION & CURETTAGE/HYSTEROSCOPY WITH MYOSURE N/A 06/03/2021   Procedure: DILATATION & CURETTAGE/HYSTEROSCOPY WITH MYOSURE;  Surgeon: Genia Del, MD;  Location: [facility];  Service: Gynecology;  Laterality: N/A;   FOOT SURGERY     bone removed from pinky toe   INGUINAL HERNIA REPAIR Right 1995   KNEE ARTHROSCOPY W/ MENISCAL REPAIR Left 06/21/2017   dr Netta Corrigan   LEG SURGERY  1972   MOHS SURGERY  2016   BCC of forehead   MOUTH SURGERY  2015   removal and cauterization pallete of cancerous lesion   TONSILLECTOMY  1979   TONSILLECTOMY  1977   TOTAL KNEE ARTHROPLASTY Left 01/26/2018   Procedure: LEFT TOTAL KNEE ARTHROPLASTY;  Surgeon: Ranee Gosselin, MD;  Location: [facility];  Service: Orthopedics;  Laterality: Left;   TUBAL LIGATION      Social History: Social History   Socioeconomic History   Marital status: Married    Spouse name: Not on file   Number of children: 6   Years of education: Not on file   Highest education level: Not on file  Occupational History   Occupation: retired    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, television/film/video for [employer]   Tobacco Use   Smoking status: Former    Current packs/day: 0.00    Average packs/day: 0.5 packs/day for 35.0 years (17.5 ttl pk-yrs)    Types: Cigarettes    Start date: 11/26/1974    Quit date: 11/26/2009    Years since quitting: 13.4   Smokeless tobacco: Never  Vaping Use   Vaping status: Never Used  Substance and Sexual Activity   Alcohol use: Not Currently   Drug use: No   Sexual activity: Not Currently    Partners: Male    Birth  control/protection: Post-menopausal    Comment: 1st intercourse 52 yo-5 partners, married- 25 yrs  Other Topics Concern   Not on file  Social History Narrative   Lives with husband. Raising 2 grandchildren of deceased daughter.       Lives in one story ranch house.    Social Determinants of Health   Financial Resource Strain: Low Risk  (04/27/2023)   Overall Financial Resource Strain (CARDIA)    Difficulty of Paying Living Expenses: Not hard at all  Food Insecurity: No Food Insecurity (04/27/2023)   Hunger Vital Sign    Worried About Running Out of Food in the Last Year: Never true    Ran Out of Food in the Last Year: Never true  Transportation Needs: No Transportation Needs (04/27/2023)   PRAPARE - Administrator, Civil Service (Medical): No    Lack of Transportation (Non-Medical): No  Physical Activity: Sufficiently Active (04/27/2023)   Exercise Vital Sign    Days of Exercise per Week: 2 days    Minutes of Exercise per Session: 90 min  Stress: No Stress Concern Present (04/27/2023)   [employer] of Occupational Health - Occupational Stress Questionnaire    Feeling of Stress : Only a little  Social Connections: Moderately Isolated (04/27/2023)   Social Connection and Isolation Panel [NHANES]    Frequency of Communication with Friends and Family: More than three times a week    Frequency of Social Gatherings with Friends and Family: Never    Attends Religious Services: Never    Database administrator or Organizations: No    Attends Banker Meetings: Never    Marital Status: Married  Catering manager Violence: Not At Risk (04/27/2023)   Humiliation, Afraid, Rape, and Kick questionnaire    Fear of Current or Ex-Partner: No    Emotionally Abused: No    Physically Abused: No    Sexually Abused: No    Family History: Family History  Problem Relation Age of Onset   Diabetes Mother    Heart Problems Mother    Rheum arthritis Mother    COPD Mother     Polycystic kidney disease Mother    Carpal tunnel syndrome Mother    Cancer Father        Liver Cancer   Liver cancer Father    Carpal tunnel syndrome Sister    Thyroid disease Sister    Rheum arthritis Sister    Lung disease Sister    Diabetes Sister    Carpal tunnel  syndrome Sister    Clotting disorder Sister    Rheum arthritis Sister    Thyroid disease Sister    COPD Sister    Heart Problems Sister    Gout Sister    Cancer Brother    Rheum arthritis Maternal Aunt    Fibromyalgia Maternal Aunt    Polycystic kidney disease Maternal Aunt    Heart Problems Maternal Aunt    Anemia Maternal Aunt    Adrenal disorder Maternal Aunt    Carpal tunnel syndrome Maternal Aunt    Diabetes Maternal Aunt    Thyroid disease Maternal Aunt    Rheum arthritis Maternal Aunt    Diabetes Maternal Aunt    Diabetes Maternal Aunt    Rheum arthritis Maternal Aunt    Heart Problems Maternal Aunt    Rheum arthritis Maternal Uncle    Heart Problems Maternal Uncle    Heart Problems Maternal Grandmother    Heart Problems Maternal Grandfather    Cancer Maternal Grandfather        Lung Cancer   Heart Problems Paternal Grandmother    Cancer Paternal Grandfather        Brain & Skin cancer   Polycystic ovary syndrome Daughter     Current Medications:  Current Outpatient Medications:    prochlorperazine (COMPAZINE) 10 MG tablet, Take 1 tablet (10 mg total) by mouth every 6 (six) hours as needed for nausea or vomiting., Disp: 30 tablet, Rfl: 3   acetaminophen (TYLENOL) 325 MG tablet, Take 2 tablets (650 mg total) by mouth every 6 (six) hours as needed., Disp: 36 tablet, Rfl: 0   albuterol (VENTOLIN HFA) 108 (90 Base) MCG/ACT inhaler, Inhale 2 puffs into the lungs every 6 (six) hours as needed., Disp: 18 g, Rfl: 2   furosemide (LASIX) 40 MG tablet, Take 1.5 tablets (60 mg total) by mouth daily., Disp: 135 tablet, Rfl: 3   HYDROcodone-acetaminophen (NORCO) 5-325 MG tablet, Take 1 tablet by mouth every  6 (six) hours as needed for moderate pain., Disp: 60 tablet, Rfl: 0   ibuprofen (ADVIL) 600 MG tablet, Take 1 tablet (600 mg total) by mouth every 6 (six) hours as needed., Disp: 30 tablet, Rfl: 0   terconazole (TERAZOL 7) 0.4 % vaginal cream, Place 1 applicator vaginally at bedtime., Disp: 45 g, Rfl: 0   Allergies: Allergies  Allergen Reactions   Influenza Vaccines Anaphylaxis and Other (See Comments)    Per patient   Penicillins Anaphylaxis and Other (See Comments)    PATIENT HAS HAD A PCN REACTION WITH IMMEDIATE RASH, FACIAL/TONGUE/THROAT SWELLING, SOB, OR LIGHTHEADEDNESS WITH HYPOTENSION:  #  #  #  YES  #  #  #   Has patient had a PCN reaction causing severe rash involving mucus membranes or skin necrosis: No PATIENT HAS HAD A PCN REACTION THAT REQUIRED HOSPITALIZATION:  #  #  #  YES  #  #  #  Has patient had a PCN reaction occurring within the last 10 years: No    Articaine Other (See Comments)    Caused infection   Cortisone Other (See Comments)    Turned red and ran a low grade fever for 3 days   Other Rash    bandaids- skin turns red and a rash     REVIEW OF SYSTEMS:   Review of Systems  Constitutional:  Negative for chills, fatigue and fever.  HENT:   Negative for lump/mass, mouth sores, nosebleeds, sore throat and trouble swallowing.   Eyes:  Negative  for eye problems.  Respiratory:  Positive for cough and shortness of breath.   Cardiovascular:  Negative for chest pain, leg swelling and palpitations.  Gastrointestinal:  Positive for constipation, diarrhea and nausea. Negative for abdominal pain and vomiting.  Genitourinary:  Negative for bladder incontinence, difficulty urinating, dysuria, frequency, hematuria and nocturia.   Musculoskeletal:  Negative for arthralgias, back pain, flank pain, myalgias and neck pain.  Skin:  Negative for itching and rash.  Neurological:  Negative for dizziness, headaches and numbness.  Hematological:  Does not bruise/bleed easily.   Psychiatric/Behavioral:  Positive for depression. Negative for sleep disturbance and suicidal ideas. The patient is nervous/anxious.   All other systems reviewed and are negative.    VITALS:   Blood pressure 126/63, pulse 83, temperature 98 F (36.7 C), resp. rate 20, weight 238 lb 8 oz (108.2 kg), SpO2 95%.  Wt Readings from Last 3 Encounters:  05/18/23 238 lb 8 oz (108.2 kg)  04/27/23 249 lb (112.9 kg)  04/27/23 247 lb (112 kg)    Body mass index is 38.49 kg/m.   PHYSICAL EXAM:   Physical Exam Vitals and nursing note reviewed. Exam conducted with a chaperone present.  Constitutional:      Appearance: Normal appearance.  Cardiovascular:     Rate and Rhythm: Normal rate and regular rhythm.     Pulses: Normal pulses.     Heart sounds: Normal heart sounds.  Pulmonary:     Effort: Pulmonary effort is normal.     Breath sounds: Normal breath sounds.  Abdominal:     Palpations: Abdomen is soft. There is no hepatomegaly, splenomegaly or mass.     Tenderness: There is no abdominal tenderness.  Musculoskeletal:     Right lower leg: No edema.     Left lower leg: No edema.  Lymphadenopathy:     Cervical: No cervical adenopathy.     Right cervical: No superficial, deep or posterior cervical adenopathy.    Left cervical: No superficial, deep or posterior cervical adenopathy.     Upper Body:     Right upper body: No supraclavicular or axillary adenopathy.     Left upper body: No supraclavicular or axillary adenopathy.  Neurological:     General: No focal deficit present.     Mental Status: She is alert and oriented to person, place, and time.  Psychiatric:        Mood and Affect: Mood normal.        Behavior: Behavior normal.     LABS:      Latest Ref Rng & Units 05/07/2023   12:55 PM 04/06/2023   11:09 AM 07/28/2022    2:13 PM  CBC  WBC 4.0 - 10.5 K/uL 8.9  6.5  7.1   Hemoglobin 12.0 - 15.0 g/dL 62.1  30.8  65.7   Hematocrit 36.0 - 46.0 % 41.1  39.8  42.1   Platelets  150 - 400 K/uL 353  343  291       Latest Ref Rng & Units 05/07/2023   12:55 PM 04/23/2023    8:54 AM 04/06/2023   11:09 AM  CMP  Glucose 70 - 99 mg/dL 846  962  84   BUN 8 - 23 mg/dL 14  14  8    Creatinine 0.44 - 1.00 mg/dL 9.52  8.41  3.24   Sodium 135 - 145 mmol/L 138  137  143   Potassium 3.5 - 5.1 mmol/L 4.0  3.8  5.0   Chloride 98 -  111 mmol/L 102  103  105   CO2 22 - 32 mmol/L 24  25  24    Calcium 8.9 - 10.3 mg/dL 16.1  9.2  9.7   Total Protein 6.5 - 8.1 g/dL 7.6   6.7   Total Bilirubin 0.3 - 1.2 mg/dL 0.8   0.5   Alkaline Phos 38 - 126 U/L 72   80   AST 15 - 41 U/L 13   13   ALT 0 - 44 U/L 18   23      No results found for: "CEA1", "CEA" / No results found for: "CEA1", "CEA" No results found for: "PSA1" No results found for: "WRU045" No results found for: "CAN125"  No results found for: "TOTALPROTELP", "ALBUMINELP", "A1GS", "A2GS", "BETS", "BETA2SER", "GAMS", "MSPIKE", "SPEI" No results found for: "TIBC", "FERRITIN", "IRONPCTSAT" No results found for: "LDH"   STUDIES:   US THYROID  Result Date: 05/12/2023 CLINICAL DATA:  Multinodular goiter follow-up EXAM: THYROID ULTRASOUND TECHNIQUE: Ultrasound examination of the thyroid gland and adjacent soft tissues was performed. COMPARISON:  07/17/2021 FINDINGS: Parenchymal Echotexture: Mildly heterogenous Isthmus: 0.2 cm Right lobe: 5.0 x 2.0 x 1.8 cm Left lobe: 3.4 x 2.6 x 1.3 cm _________________________________________________________ Estimated total number of nodules >/= 1 cm: 2 Number of spongiform nodules >/=  2 cm not described below (TR1): 0 Number of mixed cystic and solid nodules >/= 1.5 cm not described below (TR2): 0 _________________________________________________________ Nodule 1: 1.4 x 1.0 x 0.9 cm right superior thyroid nodule is not significantly changed in size since 03/26/2016 where it measured 1.3 x 0.8 x 1.1 cm, consistent with benign etiology. Nodule 2: 2.0 x 1.2 x 1.1 cm right mid thyroid nodule is not  significantly changed since 03/26/2016 where it measured 1.7 x 0.9 x 1.4 cm, consistent with benign etiology. IMPRESSION: 1. Right thyroid nodules are unchanged in size since 03/26/2016, consistent with benign etiology. 2. No new thyroid nodule. The above is in keeping with the ACR TI-RADS recommendations - J Am Coll Radiol 2017;14:587-595. Electronically Signed   By: Acquanetta Belling M.D.   On: 05/12/2023 09:46   CT ABDOMEN PELVIS W CONTRAST  Result Date: 05/07/2023 CLINICAL DATA:  Acute abdominal pain on the right, initial encounter EXAM: CT ABDOMEN AND PELVIS WITH CONTRAST TECHNIQUE: Multidetector CT imaging of the abdomen and pelvis was performed using the standard protocol following bolus administration of intravenous contrast. RADIATION DOSE REDUCTION: This exam was performed according to the departmental dose-optimization program which includes automated exposure control, adjustment of the mA and/or kV according to patient size and/or use of iterative reconstruction technique. CONTRAST:  OMNIPAQUE IOHEXOL 300 MG/ML  SOLN COMPARISON:  03/28/2023 FINDINGS: Lower chest: No acute abnormality. Hepatobiliary: Fatty infiltration of the liver is noted. Gallbladder is within normal limits. Pancreas: Pancreas is unremarkable. Spleen: Normal in size without focal abnormality. Adrenals/Urinary Tract: Left adrenal gland is within normal limits. Right adrenal gland demonstrates significant enlargement now measuring 7.6 cm in greatest dimension. This is increased from the prior exam at which time it measured 4.9 cm in greatest dimension. Areas of central decreased attenuation are noted likely related to resolving hemorrhage given the patient's clinical history. No acute increased attenuation is identified to suggest active hemorrhage at this time. Kidneys are well visualize within normal enhancement pattern. No obstructive changes are seen. The bladder is partially distended. Stomach/Bowel: Mild diverticular change of  the colon is noted without evidence of diverticulitis. The appendix is not well visualized. No inflammatory changes to suggest  appendicitis are noted. Stomach and small bowel are within normal limits. Vascular/Lymphatic: Aortic atherosclerosis. No enlarged abdominal or pelvic lymph nodes. Reproductive: Uterus and bilateral adnexa are unremarkable. Changes of bilateral tubal ligation are seen. Other: No abdominal wall hernia or abnormality. No abdominopelvic ascites. Musculoskeletal: No acute or significant osseous findings. IMPRESSION: Interval increase in size of the right adrenal gland when compared with the prior exam. A large area of decreased attenuation is noted likely related to resolving hematoma. No areas of active hemorrhage are seen at this time. Diverticulosis without diverticulitis. Fatty liver. Electronically Signed   By: Alcide Clever M.D.   On: 05/07/2023 15:16   DG Chest Portable 1 View  Result Date: 05/07/2023 CLINICAL DATA:  dib x 1 month EXAM: PORTABLE CHEST - 1 VIEW COMPARISON:  03/28/2023 FINDINGS: Lungs are clear. Heart size and mediastinal contours are within normal limits. No effusion. Cervical fixation hardware. IMPRESSION: No acute cardiopulmonary disease. Electronically Signed   By: Corlis Leak M.D.   On: 05/07/2023 14:25

## 2023-05-18 ENCOUNTER — Encounter: Payer: Self-pay | Admitting: Hematology

## 2023-05-18 ENCOUNTER — Other Ambulatory Visit: Payer: Self-pay | Admitting: *Deleted

## 2023-05-18 ENCOUNTER — Inpatient Hospital Stay: Attending: Hematology | Admitting: Hematology

## 2023-05-18 VITALS — BP 126/63 | HR 83 | Temp 98.0°F | Resp 20 | Wt 238.5 lb

## 2023-05-18 DIAGNOSIS — R197 Diarrhea, unspecified: Secondary | ICD-10-CM | POA: Insufficient documentation

## 2023-05-18 DIAGNOSIS — C3492 Malignant neoplasm of unspecified part of left bronchus or lung: Secondary | ICD-10-CM | POA: Diagnosis not present

## 2023-05-18 DIAGNOSIS — Z87891 Personal history of nicotine dependence: Secondary | ICD-10-CM | POA: Insufficient documentation

## 2023-05-18 DIAGNOSIS — E2749 Other adrenocortical insufficiency: Secondary | ICD-10-CM | POA: Diagnosis present

## 2023-05-18 DIAGNOSIS — R110 Nausea: Secondary | ICD-10-CM | POA: Diagnosis not present

## 2023-05-18 DIAGNOSIS — R109 Unspecified abdominal pain: Secondary | ICD-10-CM | POA: Insufficient documentation

## 2023-05-18 DIAGNOSIS — M549 Dorsalgia, unspecified: Secondary | ICD-10-CM | POA: Diagnosis not present

## 2023-05-18 DIAGNOSIS — Z79899 Other long term (current) drug therapy: Secondary | ICD-10-CM | POA: Diagnosis not present

## 2023-05-18 DIAGNOSIS — C7971 Secondary malignant neoplasm of right adrenal gland: Secondary | ICD-10-CM | POA: Insufficient documentation

## 2023-05-18 MED ORDER — PROCHLORPERAZINE MALEATE 10 MG PO TABS: 10.0000 mg | ORAL_TABLET | Freq: Four times a day (QID) | ORAL | 3 refills | Status: DC | PRN

## 2023-05-18 MED ORDER — HYDROCODONE-ACETAMINOPHEN 5-325 MG PO TABS: 1.0000 | ORAL_TABLET | Freq: Four times a day (QID) | ORAL | 0 refills | Status: DC | PRN

## 2023-05-18 NOTE — Patient Instructions (Signed)
[facility] at [facility] Discharge Instructions   You were seen and examined today by Dr. Ellin Saba.  He reviewed the results of your lab work which is normal.   We will send you pain medication and nausea medication.   If your pain does not resolve, we will refer you to either interventional radiology or surgeon to have this taken care of.   We will see you back in 6 weeks.    Thank you for choosing [facility] at [facility] to provide your oncology and hematology care.  To afford each patient quality time with our provider, please arrive at least 15 minutes before your scheduled appointment time.   If you have a lab appointment with the Cancer Center please come in thru the Main Entrance and check in at the main information desk.  You need to re-schedule your appointment should you arrive 10 or more minutes late.  We strive to give you quality time with our providers, and arriving late affects you and other patients whose appointments are after yours.  Also, if you no show three or more times for appointments you may be dismissed from the clinic at the providers discretion.     Again, thank you for choosing [facility].  Our hope is that these requests will decrease the amount of time that you wait before being seen by our physicians.       _____________________________________________________________  Should you have questions after your visit to [facility], please contact our office at [phone] and follow the prompts.  Our office hours are 8:00 a.m. and 4:30 p.m. Monday - Friday.  Please note that voicemails left after 4:00 p.m. may not be returned until the following business day.  We are closed weekends and major holidays.  You do have access to a nurse 24-7, just call the main number to the clinic [phone] and do not press any options, hold on the line and a nurse will answer the phone.    For  prescription refill requests, have your pharmacy contact our office and allow 72 hours.    Due to Covid, you will need to wear a mask upon entering the hospital. If you do not have a mask, a mask will be given to you at the Main Entrance upon arrival. For doctor visits, patients may have 1 support person age 59 or older with them. For treatment visits, patients can not have anyone with them due to social distancing guidelines and our immunocompromised population.

## 2023-05-19 ENCOUNTER — Encounter: Payer: Self-pay | Admitting: Obstetrics & Gynecology

## 2023-05-19 ENCOUNTER — Other Ambulatory Visit: Payer: Self-pay | Admitting: *Deleted

## 2023-05-19 ENCOUNTER — Other Ambulatory Visit: Payer: Self-pay | Admitting: Obstetrics & Gynecology

## 2023-05-19 ENCOUNTER — Ambulatory Visit (INDEPENDENT_AMBULATORY_CARE_PROVIDER_SITE_OTHER)

## 2023-05-19 ENCOUNTER — Ambulatory Visit: Admitting: Obstetrics & Gynecology

## 2023-05-19 VITALS — BP 119/77 | HR 78

## 2023-05-19 DIAGNOSIS — N840 Polyp of corpus uteri: Secondary | ICD-10-CM

## 2023-05-19 DIAGNOSIS — N950 Postmenopausal bleeding: Secondary | ICD-10-CM

## 2023-05-19 MED ORDER — PROGESTERONE 200 MG PO CAPS
ORAL_CAPSULE | ORAL | 4 refills | Status: DC
Start: 2023-05-19 — End: 2023-07-20

## 2023-05-19 MED ORDER — HYDROCODONE-ACETAMINOPHEN 5-325 MG PO TABS: 1.0000 | ORAL_TABLET | Freq: Four times a day (QID) | ORAL | 0 refills | Status: DC | PRN

## 2023-05-19 NOTE — Progress Notes (Signed)
PELVIC US TA/TV: Heterogeneous anteverted uterus with echogenic myometrial linear striations ? Adenomyosis,avascular thicken homogenous endometrium, EEC 8.2 mm,normal ovaries(limited view), no free fluid,no pain during ultrasound

## 2023-05-19 NOTE — Progress Notes (Signed)
Follow up appointment for results: PMB  Chief Complaint  Patient presents with   Follow-up    [place] today    Blood pressure 119/77, pulse 78.   US PELVIC COMPLETE WITH TRANSVAGINAL  Result Date: 05/19/2023 Images from the original result were not included.  ..an Financial trader of Ultrasound Medicine Technical sales engineer) accredited practice Center for [facility] @ [facility] [address] [place] [address] Ordering Provider: Lazaro Arms, MD                                                                                                                                   GYNECOLOGIC SONOGRAM Miranda Perkins is a 62 y.o. [phone] No LMP recorded. Patient is postmenopausal. She is here for a pelvic sonogram for postmenopausal bleeding. Uterus                      6.8 x 4.7 x 5.9 cm, Total uterine volume 98 cc, heterogeneous, anteverted uterus with echogenic myometrial linear striations, ? Adenomyosis Endometrium          8.2 mm, symmetrical, avascular, thicken, homogenous endometrium Right ovary             2.6 x 1.7 x 1.9 cm, normal (limited view) Left ovary                2 x 2.4 x 2.2 cm, normal (limited view) No free fluid,multiple simple nabothian cysts Technician Comments: PELVIC US TA/TV: Heterogeneous anteverted uterus with echogenic myometrial linear striations, ? Adenomyosis,avascular thicken homogenous endometrium, EEC 8.2 mm,normal ovaries (limited view), no free fluid,no pain during ultrasound Chaperone [address] Flora Lipps 05/19/2023 3:50 PM  Clinical Impression and recommendations: I have reviewed the sonogram results above, combined with the patient's current clinical course, below are my impressions and any appropriate recommendations for management based on the sonographic findings. Uterus normal size shape and contour, adenomyosis Endometrium 8.2 mm no polyps homogenous no blood flow Ovaries: normal size shape and morphology for post menopausal woman Lazaro Arms 05/19/2023 4:26  PM  US THYROID  Result Date: 05/12/2023 CLINICAL DATA:  Multinodular goiter follow-up EXAM: THYROID ULTRASOUND TECHNIQUE: Ultrasound examination of the thyroid gland and adjacent soft tissues was performed. COMPARISON:  07/17/2021 FINDINGS: Parenchymal Echotexture: Mildly heterogenous Isthmus: 0.2 cm Right lobe: 5.0 x 2.0 x 1.8 cm Left lobe: 3.4 x 2.6 x 1.3 cm _________________________________________________________ Estimated total number of nodules >/= 1 cm: 2 Number of spongiform nodules >/=  2 cm not described below (TR1): 0 Number of mixed cystic and solid nodules >/= 1.5 cm not described below (TR2): 0 _________________________________________________________ Nodule 1: 1.4 x 1.0 x 0.9 cm right superior thyroid nodule is not significantly changed in size since 03/26/2016 where it measured 1.3 x 0.8 x 1.1 cm, consistent with benign etiology. Nodule 2: 2.0 x 1.2 x 1.1 cm right mid thyroid nodule is not significantly changed since 03/26/2016  where it measured 1.7 x 0.9 x 1.4 cm, consistent with benign etiology. IMPRESSION: 1. Right thyroid nodules are unchanged in size since 03/26/2016, consistent with benign etiology. 2. No new thyroid nodule. The above is in keeping with the ACR TI-RADS recommendations - J Am Coll Radiol 2017;14:587-595. Electronically Signed   By: Acquanetta Belling M.D.   On: 05/12/2023 09:46   CT ABDOMEN PELVIS W CONTRAST  Result Date: 05/07/2023 CLINICAL DATA:  Acute abdominal pain on the right, initial encounter EXAM: CT ABDOMEN AND PELVIS WITH CONTRAST TECHNIQUE: Multidetector CT imaging of the abdomen and pelvis was performed using the standard protocol following bolus administration of intravenous contrast. RADIATION DOSE REDUCTION: This exam was performed according to the departmental dose-optimization program which includes automated exposure control, adjustment of the mA and/or kV according to patient size and/or use of iterative reconstruction technique. CONTRAST:  OMNIPAQUE  IOHEXOL 300 MG/ML  SOLN COMPARISON:  03/28/2023 FINDINGS: Lower chest: No acute abnormality. Hepatobiliary: Fatty infiltration of the liver is noted. Gallbladder is within normal limits. Pancreas: Pancreas is unremarkable. Spleen: Normal in size without focal abnormality. Adrenals/Urinary Tract: Left adrenal gland is within normal limits. Right adrenal gland demonstrates significant enlargement now measuring 7.6 cm in greatest dimension. This is increased from the prior exam at which time it measured 4.9 cm in greatest dimension. Areas of central decreased attenuation are noted likely related to resolving hemorrhage given the patient's clinical history. No acute increased attenuation is identified to suggest active hemorrhage at this time. Kidneys are well visualize within normal enhancement pattern. No obstructive changes are seen. The bladder is partially distended. Stomach/Bowel: Mild diverticular change of the colon is noted without evidence of diverticulitis. The appendix is not well visualized. No inflammatory changes to suggest appendicitis are noted. Stomach and small bowel are within normal limits. Vascular/Lymphatic: Aortic atherosclerosis. No enlarged abdominal or pelvic lymph nodes. Reproductive: Uterus and bilateral adnexa are unremarkable. Changes of bilateral tubal ligation are seen. Other: No abdominal wall hernia or abnormality. No abdominopelvic ascites. Musculoskeletal: No acute or significant osseous findings. IMPRESSION: Interval increase in size of the right adrenal gland when compared with the prior exam. A large area of decreased attenuation is noted likely related to resolving hematoma. No areas of active hemorrhage are seen at this time. Diverticulosis without diverticulitis. Fatty liver. Electronically Signed   By: Alcide Clever M.D.   On: 05/07/2023 15:16   DG Chest Portable 1 View  Result Date: 05/07/2023 CLINICAL DATA:  dib x 1 month EXAM: PORTABLE CHEST - 1 VIEW COMPARISON:   03/28/2023 FINDINGS: Lungs are clear. Heart size and mediastinal contours are within normal limits. No effusion. Cervical fixation hardware. IMPRESSION: No acute cardiopulmonary disease. Electronically Signed   By: Corlis Leak M.D.   On: 05/07/2023 14:25    MEDS ordered this encounter: Meds ordered this encounter  Medications   progesterone (PROMETRIUM) 200 MG capsule    Sig: Nightly 10 days every 3 months    Dispense:  30 capsule    Refill:  4    Orders for this encounter: No orders of the defined types were placed in this encounter.   Impression + Management Plan   ICD-10-CM   1. PMB (postmenopausal bleeding): endometrium is benign with benign endometrium  N95.0    will cycle every 3 months with prometrium 10 every 3 months      Follow Up: Return in about 1 year (around 05/18/2024) for Follow up, with Dr Despina Hidden.     All questions  were answered.  Past Medical History:  Diagnosis Date   Adrenal hemorrhage (HCC)    Arthritis    Edema of both lower extremities    Endometrial polyp    Hirsutism    History of 2019 novel coronavirus disease (COVID-19) 12/21/2020   positive home result documented in pcp note in epic 12-23-2020,  residual doe, pulmonology consult w/ dr wert note in epic 03-20-2021   History of basal cell carcinoma (BCC) excision 2016   forehead, per pt no recurrence   History of CVA (cerebrovascular accident) without residual deficits 1993   pt stated , while on Chloramphenicol, no residuaL; [medication given to treat tick bite] ; patient states  "i stroked out right next to my doctor , he said i passed out and that was my only symptom" ;  no resiudal   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 branch,  work-up results in epic 03/ 2022 (normal nuclear stress test, normal echo, no arrhythmia's per event monitor);    (05-28-2021 per pt no symptoms since 03/ 2022)   Multiple thyroid nodules    endocrinologist--- dr g. nida,  hx benign bx, clinically euthyroid   Osteopenia 12/2017   T score -1.2 FRAX 4.9% / 0.3%   PMB (postmenopausal bleeding)    Post-COVID chronic dyspnea    Rosacea    Thickened endometrium     Past Surgical History:  Procedure Laterality Date   ANTERIOR AND POSTERIOR REPAIR N/A 08/21/2020   Procedure: ANTERIOR (CYSTOCELE) AND POSTERIOR REPAIR (RECTOCELE);  Surgeon: Genia Del, MD;  Location: [facility];  Service: Gynecology;  Laterality: N/A;  requesting 9:00am OR time  requests one hour   ANTERIOR CERVICAL DECOMP/DISCECTOMY FUSION N/A 09/27/2017   Procedure: ANTERIOR CERVICAL DECOMPRESSION/DISCECTOMY FUSION CERVICAL FOUR-FIVE ,CERVICAL FIVE-SIX,CERVICAL SIX-SEVEN;  Surgeon: Donalee Citrin, MD;  Location: [facility] OR;  Service: Neurosurgery;  Laterality: N/A;   CARPAL TUNNEL RELEASE Left 09/27/2017   Procedure: CARPAL TUNNEL RELEASE;  Surgeon: Donalee Citrin, MD;  Location: [facility] OR;  Service: Neurosurgery;  Laterality: Left;   DILATATION & CURETTAGE/HYSTEROSCOPY WITH MYOSURE N/A 06/03/2021   Procedure: DILATATION & CURETTAGE/HYSTEROSCOPY WITH MYOSURE;  Surgeon: Genia Del, MD;  Location: [facility];  Service: Gynecology;  Laterality: N/A;   FOOT SURGERY     bone removed from pinky toe   INGUINAL HERNIA REPAIR Right 1995   KNEE ARTHROSCOPY W/ MENISCAL REPAIR Left 06/21/2017   dr Netta Corrigan   LEG SURGERY  1972   MOHS SURGERY  2016   BCC of forehead   MOUTH SURGERY  2015   removal and cauterization pallete of cancerous lesion   TONSILLECTOMY  1979   TONSILLECTOMY  1977   TOTAL KNEE ARTHROPLASTY Left 01/26/2018   Procedure: LEFT TOTAL KNEE ARTHROPLASTY;  Surgeon: Ranee Gosselin, MD;  Location: [facility];  Service: Orthopedics;  Laterality: Left;   TUBAL LIGATION      OB History     Gravida  4   Para  3   Term      Preterm      AB  1    Living  2      SAB  1   IAB      Ectopic      Multiple      Live Births  3  Allergies  Allergen Reactions   Influenza Vaccines Anaphylaxis and Other (See Comments)    Per patient   Penicillins Anaphylaxis and Other (See Comments)    PATIENT HAS HAD A PCN REACTION WITH IMMEDIATE RASH, FACIAL/TONGUE/THROAT SWELLING, SOB, OR LIGHTHEADEDNESS WITH HYPOTENSION:  #  #  #  YES  #  #  #   Has patient had a PCN reaction causing severe rash involving mucus membranes or skin necrosis: No PATIENT HAS HAD A PCN REACTION THAT REQUIRED HOSPITALIZATION:  #  #  #  YES  #  #  #  Has patient had a PCN reaction occurring within the last 10 years: No    Articaine Other (See Comments)    Caused infection   Cortisone Other (See Comments)    Turned red and ran a low grade fever for 3 days   Other Rash    bandaids- skin turns red and a rash     Social History   Socioeconomic History   Marital status: Married    Spouse name: Not on file   Number of children: 6   Years of education: Not on file   Highest education level: Not on file  Occupational History   Occupation: retired    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, television/film/video for [employer]   Tobacco Use   Smoking status: Former    Current packs/day: 0.00    Average packs/day: 0.5 packs/day for 35.0 years (17.5 ttl pk-yrs)    Types: Cigarettes    Start date: 11/26/1974    Quit date: 11/26/2009    Years since quitting: 13.4   Smokeless tobacco: Never  Vaping Use   Vaping status: Never Used  Substance and Sexual Activity   Alcohol use: Not Currently   Drug use: No   Sexual activity: Not Currently    Partners: Male    Birth control/protection: Post-menopausal    Comment: 1st intercourse 63 yo-5 partners, married- 25 yrs  Other Topics Concern   Not on file  Social History Narrative   Lives with husband. Raising 2 grandchildren of deceased daughter.       Lives in one story ranch house.    Social Determinants of Health   Financial  Resource Strain: Low Risk  (04/27/2023)   Overall Financial Resource Strain (CARDIA)    Difficulty of Paying Living Expenses: Not hard at all  Food Insecurity: No Food Insecurity (04/27/2023)   Hunger Vital Sign    Worried About Running Out of Food in the Last Year: Never true    Ran Out of Food in the Last Year: Never true  Transportation Needs: No Transportation Needs (04/27/2023)   PRAPARE - Administrator, Civil Service (Medical): No    Lack of Transportation (Non-Medical): No  Physical Activity: Sufficiently Active (04/27/2023)   Exercise Vital Sign    Days of Exercise per Week: 2 days    Minutes of Exercise per Session: 90 min  Stress: No Stress Concern Present (04/27/2023)   [employer] of Occupational Health - Occupational Stress Questionnaire    Feeling of Stress : Only a little  Social Connections: Moderately Isolated (04/27/2023)   Social Connection and Isolation Panel [NHANES]    Frequency of Communication with Friends and Family: More than three times a week    Frequency of Social Gatherings with Friends and Family: Never    Attends Religious Services: Never    Database administrator or Organizations: No    Attends Banker Meetings: Never  Marital Status: Married    Family History  Problem Relation Age of Onset   Diabetes Mother    Heart Problems Mother    Rheum arthritis Mother    COPD Mother    Polycystic kidney disease Mother    Carpal tunnel syndrome Mother    Cancer Father        Liver Cancer   Liver cancer Father    Carpal tunnel syndrome Sister    Thyroid disease Sister    Rheum arthritis Sister    Lung disease Sister    Diabetes Sister    Carpal tunnel syndrome Sister    Clotting disorder Sister    Rheum arthritis Sister    Thyroid disease Sister    COPD Sister    Heart Problems Sister    Gout Sister    Cancer Brother    Rheum arthritis Maternal Aunt    Fibromyalgia Maternal Aunt    Polycystic kidney disease Maternal  Aunt    Heart Problems Maternal Aunt    Anemia Maternal Aunt    Adrenal disorder Maternal Aunt    Carpal tunnel syndrome Maternal Aunt    Diabetes Maternal Aunt    Thyroid disease Maternal Aunt    Rheum arthritis Maternal Aunt    Diabetes Maternal Aunt    Diabetes Maternal Aunt    Rheum arthritis Maternal Aunt    Heart Problems Maternal Aunt    Rheum arthritis Maternal Uncle    Heart Problems Maternal Uncle    Heart Problems Maternal Grandmother    Heart Problems Maternal Grandfather    Cancer Maternal Grandfather        Lung Cancer   Heart Problems Paternal Grandmother    Cancer Paternal Grandfather        Brain & Skin cancer   Polycystic ovary syndrome Daughter

## 2023-05-20 ENCOUNTER — Other Ambulatory Visit: Payer: Self-pay

## 2023-05-20 ENCOUNTER — Observation Stay (HOSPITAL_COMMUNITY)
Admission: EM | Admit: 2023-05-20 | Discharge: 2023-05-21 | Disposition: A | Discharge status: Home / Self Care | Attending: Family Medicine | Admitting: Family Medicine

## 2023-05-20 ENCOUNTER — Encounter (HOSPITAL_COMMUNITY): Payer: Self-pay | Admitting: *Deleted

## 2023-05-20 ENCOUNTER — Emergency Department (HOSPITAL_COMMUNITY)

## 2023-05-20 DIAGNOSIS — L719 Rosacea, unspecified: Secondary | ICD-10-CM | POA: Insufficient documentation

## 2023-05-20 DIAGNOSIS — Z96652 Presence of left artificial knee joint: Secondary | ICD-10-CM | POA: Insufficient documentation

## 2023-05-20 DIAGNOSIS — Z87891 Personal history of nicotine dependence: Secondary | ICD-10-CM | POA: Diagnosis not present

## 2023-05-20 DIAGNOSIS — Z8673 Personal history of transient ischemic attack (TIA), and cerebral infarction without residual deficits: Secondary | ICD-10-CM | POA: Insufficient documentation

## 2023-05-20 DIAGNOSIS — R109 Unspecified abdominal pain: Secondary | ICD-10-CM | POA: Diagnosis present

## 2023-05-20 DIAGNOSIS — Z8616 Personal history of COVID-19: Secondary | ICD-10-CM | POA: Diagnosis not present

## 2023-05-20 DIAGNOSIS — E2749 Other adrenocortical insufficiency: Secondary | ICD-10-CM | POA: Diagnosis not present

## 2023-05-20 DIAGNOSIS — E278 Other specified disorders of adrenal gland: Secondary | ICD-10-CM | POA: Diagnosis not present

## 2023-05-20 DIAGNOSIS — Z79899 Other long term (current) drug therapy: Secondary | ICD-10-CM | POA: Diagnosis not present

## 2023-05-20 DIAGNOSIS — R52 Pain, unspecified: Secondary | ICD-10-CM | POA: Diagnosis present

## 2023-05-20 DIAGNOSIS — R918 Other nonspecific abnormal finding of lung field: Secondary | ICD-10-CM | POA: Insufficient documentation

## 2023-05-20 DIAGNOSIS — Z85828 Personal history of other malignant neoplasm of skin: Secondary | ICD-10-CM | POA: Diagnosis not present

## 2023-05-20 LAB — URINALYSIS, ROUTINE W REFLEX MICROSCOPIC
Bilirubin Urine: NEGATIVE
Glucose, UA: NEGATIVE mg/dL
Hgb urine dipstick: NEGATIVE
Ketones, ur: NEGATIVE mg/dL
Leukocytes,Ua: NEGATIVE
Nitrite: NEGATIVE
Protein, ur: NEGATIVE mg/dL
Specific Gravity, Urine: 1.011 (ref 1.005–1.030)
pH: 7 (ref 5.0–8.0)

## 2023-05-20 LAB — CBC WITH DIFFERENTIAL/PLATELET
Abs Immature Granulocytes: 0.04 10*3/uL (ref 0.00–0.07)
Basophils Absolute: 0 10*3/uL (ref 0.0–0.1)
Basophils Relative: 0 %
Eosinophils Absolute: 0.1 10*3/uL (ref 0.0–0.5)
Eosinophils Relative: 2 %
HCT: 38.8 % (ref 36.0–46.0)
Hemoglobin: 12.5 g/dL (ref 12.0–15.0)
Immature Granulocytes: 1 %
Lymphocytes Relative: 21 %
Lymphs Abs: 1.8 10*3/uL (ref 0.7–4.0)
MCH: 27.8 pg (ref 26.0–34.0)
MCHC: 32.2 g/dL (ref 30.0–36.0)
MCV: 86.2 fL (ref 80.0–100.0)
Monocytes Absolute: 0.8 10*3/uL (ref 0.1–1.0)
Monocytes Relative: 9 %
Neutro Abs: 5.9 10*3/uL (ref 1.7–7.7)
Neutrophils Relative %: 67 %
Platelets: 456 10*3/uL — ABNORMAL HIGH (ref 150–400)
RBC: 4.5 MIL/uL (ref 3.87–5.11)
RDW: 13.2 % (ref 11.5–15.5)
WBC: 8.8 10*3/uL (ref 4.0–10.5)
nRBC: 0 % (ref 0.0–0.2)

## 2023-05-20 LAB — CBC
HCT: 36.7 % (ref 36.0–46.0)
Hemoglobin: 11.7 g/dL — ABNORMAL LOW (ref 12.0–15.0)
MCH: 28.2 pg (ref 26.0–34.0)
MCHC: 31.9 g/dL (ref 30.0–36.0)
MCV: 88.4 fL (ref 80.0–100.0)
Platelets: 354 10*3/uL (ref 150–400)
RBC: 4.15 MIL/uL (ref 3.87–5.11)
RDW: 13.1 % (ref 11.5–15.5)
WBC: 9.5 10*3/uL (ref 4.0–10.5)
nRBC: 0 % (ref 0.0–0.2)

## 2023-05-20 LAB — COMPREHENSIVE METABOLIC PANEL
ALT: 41 U/L (ref 0–44)
AST: 19 U/L (ref 15–41)
Albumin: 3.9 g/dL (ref 3.5–5.0)
Alkaline Phosphatase: 78 U/L (ref 38–126)
Anion gap: 11 (ref 5–15)
BUN: 11 mg/dL (ref 8–23)
CO2: 27 mmol/L (ref 22–32)
Calcium: 9.2 mg/dL (ref 8.9–10.3)
Chloride: 99 mmol/L (ref 98–111)
Creatinine, Ser: 0.87 mg/dL (ref 0.44–1.00)
GFR, Estimated: >60 mL/min (ref >60)
Glucose, Bld: 91 mg/dL (ref 70–99)
Potassium: 3.6 mmol/L (ref 3.5–5.1)
Sodium: 137 mmol/L (ref 135–145)
Total Bilirubin: 0.8 mg/dL (ref 0.3–1.2)
Total Protein: 7.9 g/dL (ref 6.5–8.1)

## 2023-05-20 LAB — LIPASE, BLOOD: Lipase: 27 U/L (ref 11–51)

## 2023-05-20 MED ORDER — SODIUM CHLORIDE 0.9 % IV SOLN: INTRAVENOUS | Status: DC

## 2023-05-20 MED ORDER — HYDROMORPHONE HCL 1 MG/ML IJ SOLN
1.0000 mg | Freq: Once | INTRAMUSCULAR | Status: AC
  Administered 2023-05-20: 1 mg via INTRAVENOUS
  Filled 2023-05-20: qty 1

## 2023-05-20 MED ORDER — ACETAMINOPHEN 325 MG PO TABS: 650.0000 mg | ORAL_TABLET | Freq: Four times a day (QID) | ORAL | Status: DC | PRN

## 2023-05-20 MED ORDER — OXYCODONE HCL 5 MG PO TABS
5.0000 mg | ORAL_TABLET | ORAL | Status: DC | PRN
  Administered 2023-05-21 (×3): 5 mg via ORAL
  Filled 2023-05-20 (×3): qty 1

## 2023-05-20 MED ORDER — ONDANSETRON HCL 4 MG/2ML IJ SOLN
4.0000 mg | Freq: Once | INTRAMUSCULAR | Status: AC
  Administered 2023-05-20: 4 mg via INTRAVENOUS
  Filled 2023-05-20: qty 2

## 2023-05-20 MED ORDER — IOHEXOL 350 MG/ML SOLN
100.0000 mL | Freq: Once | INTRAVENOUS | Status: AC | PRN
  Administered 2023-05-20: 100 mL via INTRAVENOUS

## 2023-05-20 MED ORDER — ACETAMINOPHEN 650 MG RE SUPP: 650.0000 mg | Freq: Four times a day (QID) | RECTAL | Status: DC | PRN

## 2023-05-20 MED ORDER — MORPHINE SULFATE (PF) 2 MG/ML IV SOLN
2.0000 mg | INTRAVENOUS | Status: DC | PRN
  Administered 2023-05-20 – 2023-05-21 (×2): 2 mg via INTRAVENOUS
  Filled 2023-05-20 (×2): qty 1

## 2023-05-20 MED ORDER — ONDANSETRON HCL 4 MG/2ML IJ SOLN
4.0000 mg | Freq: Four times a day (QID) | INTRAMUSCULAR | Status: DC | PRN
  Administered 2023-05-20 – 2023-05-21 (×3): 4 mg via INTRAVENOUS
  Filled 2023-05-20 (×3): qty 2

## 2023-05-20 MED ORDER — SODIUM CHLORIDE 0.9 % IV BOLUS
1000.0000 mL | Freq: Once | INTRAVENOUS | Status: AC
  Administered 2023-05-20: 1000 mL via INTRAVENOUS

## 2023-05-20 MED ORDER — ONDANSETRON HCL 4 MG PO TABS: 4.0000 mg | ORAL_TABLET | Freq: Four times a day (QID) | ORAL | Status: DC | PRN

## 2023-05-20 NOTE — ED Triage Notes (Signed)
Pt with continued right flank pain, right lower back and RLQ for past couple days.  Pt seen for same on 7/5 at [facility]. Pt states she was admitted in May at [facility] for adrenal bleed.

## 2023-05-20 NOTE — ED Notes (Signed)
Pt off to CT.

## 2023-05-20 NOTE — ED Notes (Signed)
Introduced self to pt Pt complains of RIGHT flank pain that radiates to front and down to groin.  Pt seen Dr Kirtland Bouchard yesterday and was given [facility] for pain MD told her that he would refer her to surgery if pain is not better.   Pt stated that she came to ED today since [facility] is not helping and wants to see if her bleed has increased.   Pt attached to partial monitor Vitals listed   Pt also complains of cough, sitting up blood. Complains of CP, denies SOB Complains of nausea and constipation x2 days Pt stated that she has been urinating normally

## 2023-05-20 NOTE — ED Provider Notes (Signed)
[place] EMERGENCY DEPARTMENT AT [facility] Provider Note   CSN: [id number] Arrival date & time: 05/20/23  1509     History  Chief Complaint  Patient presents with   Flank Pain    Miranda Perkins is a 62 y.o. female.   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  Patient did not require surgical intervention.  However since that time the patient has had persistent pain in her right flank.  She was seen back in the emergency room on July 5 of this year.  She had a repeat CT scan that showed decreasing size of the hemorrhage but enlargement of the adrenal gland.  Patient has been following with gynecology here in [place].  Patient was started on hydrocodone but she states her pain persists.  Patient states recently she also started coughing and has coughed up some blood.  No shortness of breath.  No fevers.  No vomiting  Home Medications Prior to Admission medications   Medication Sig Start Date End Date Taking? Authorizing Provider  albuterol (VENTOLIN HFA) 108 (90 Base) MCG/ACT inhaler Inhale 2 puffs into the lungs every 6 (six) hours as needed. 04/15/23   Daphine Deutscher Mary-Margaret, FNP  furosemide (LASIX) 40 MG tablet Take 1.5 tablets (60 mg total) by mouth daily. 08/24/22   Antoine Poche, MD  HYDROcodone-acetaminophen (NORCO) 7.5-325 MG tablet Take 1 tablet by mouth every 6 (six) hours as needed for moderate pain.    [provider]  ibuprofen (ADVIL) 600 MG tablet Take 1 tablet (600 mg total) by mouth every 6 (six) hours as needed. 05/07/23   Sloan Leiter, DO  prochlorperazine (COMPAZINE) 10 MG tablet Take 1 tablet (10 mg total) by mouth every 6 (six) hours as needed for nausea or vomiting. 05/18/23   [name], MD  progesterone (PROMETRIUM) 200 MG capsule Nightly 10 days every 3 months 05/19/23   Lazaro Arms, MD  terconazole  (TERAZOL 7) 0.4 % vaginal cream Place 1 applicator vaginally at bedtime. Patient not taking: Reported on 05/19/2023 04/27/23   Lazaro Arms, MD      Allergies    Influenza vaccines, Penicillins, Articaine, Cortisone, and Other    Review of Systems   Review of Systems  Genitourinary:  Positive for flank pain.    Physical Exam Updated Vital Signs BP 130/70   Pulse 90   Temp 98.3 F (36.8 C) (Oral)   Resp 19   Ht 1.676 m (5\' 6" )   Wt 107 kg   SpO2 91%   BMI 38.09 kg/m  Physical Exam Vitals and nursing note reviewed.  Constitutional:      General: She is not in acute distress.    Appearance: She is well-developed.  HENT:     Head: Normocephalic and atraumatic.     Right Ear: External ear normal.     Left Ear: External ear normal.  Eyes:     General: No scleral icterus.       Right eye: No discharge.        Left eye: No discharge.     Conjunctiva/sclera: Conjunctivae normal.  Neck:     Trachea: No tracheal deviation.  Cardiovascular:     Rate and Rhythm: Normal rate and regular rhythm.  Pulmonary:     Effort: Pulmonary effort is normal. No respiratory distress.     Breath sounds: Normal  breath sounds. No stridor. No wheezing or rales.  Abdominal:     General: Bowel sounds are normal. There is no distension.     Palpations: Abdomen is soft.     Tenderness: There is abdominal tenderness. There is no guarding or rebound.     Comments: Tenderness palpation right upper quadrant  Musculoskeletal:        General: No tenderness or deformity.     Cervical back: Neck supple.  Skin:    General: Skin is warm and dry.     Findings: No rash.  Neurological:     General: No focal deficit present.     Mental Status: She is alert.     Cranial Nerves: No cranial nerve deficit, dysarthria or facial asymmetry.     Sensory: No sensory deficit.     Motor: No abnormal muscle tone or seizure activity.     Coordination: Coordination normal.  Psychiatric:        Mood and Affect: Mood  normal.     ED Results / Procedures / Treatments   Labs (all labs ordered are listed, but only abnormal results are displayed) Labs Reviewed  CBC WITH DIFFERENTIAL/PLATELET - Abnormal; Notable for the following components:      Result Value   Platelets 456 (*)    All other components within normal limits  COMPREHENSIVE METABOLIC PANEL  LIPASE, BLOOD  URINALYSIS, ROUTINE W REFLEX MICROSCOPIC    EKG EKG Interpretation Date/Time:  Thursday May 20 2023 16:30:16 EDT Ventricular Rate:  82 PR Interval:  125 QRS Duration:  102 QT Interval:  377 QTC Calculation: 441 R Axis:   95  Text Interpretation: Sinus rhythm Right axis deviation No significant change since last tracing Confirmed by Linwood Dibbles [phone]) on 05/20/2023 4:52:10 PM  Radiology CT ABDOMEN PELVIS W CONTRAST  Result Date: 05/20/2023 CLINICAL DATA:  Right flank and lower quadrant pain EXAM: CT ANGIOGRAPHY CHEST CT ABDOMEN AND PELVIS WITH CONTRAST TECHNIQUE: Multidetector CT imaging of the chest was performed using the standard protocol during bolus administration of intravenous contrast. Multiplanar CT image reconstructions and MIPs were obtained to evaluate the vascular anatomy. Multidetector CT imaging of the abdomen and pelvis was performed using the standard protocol during bolus administration of intravenous contrast. RADIATION DOSE REDUCTION: This exam was performed according to the departmental dose-optimization program which includes automated exposure control, adjustment of the mA and/or kV according to patient size and/or use of iterative reconstruction technique. CONTRAST:  OMNIPAQUE IOHEXOL 350 MG/ML SOLN COMPARISON:  CT 05/07/2023, chest x-ray 05/07/2023, CT 03/28/2023, CT 11/29/2020 further enlargement of right adrenal gland now measuring 8.9 x 6.7 cm, most recent prior measurement of 7.7 by 6.2 cm. Low-density cystic area on series 2, image 22 posteriorly could reflect organizing hematoma. Interim finding of  moderate slightly loculated fluid surrounding the superior pole right kidney, series 2, image 31, coronal series 4, image 84. Slight interval increase in right perinephric stranding. FINDINGS: CTA CHEST FINDINGS Cardiovascular: Satisfactory opacification of the pulmonary arteries to the segmental level. No evidence of pulmonary embolism. Normal heart size. No pericardial effusion. Nonaneurysmal aorta. No dissection is seen. Mediastinum/Nodes: Midline trachea. No thyroid mass. Esophagus within normal limits. Lungs/Pleura: No pleural effusion or pneumothorax. Solid left hilar mass measuring approximately 5.3 by 3.9 cm with partial in case mint of left-sided pulmonary vessels. Mild narrowing of left lower lobe bronchi but no occlusion. Irregular focus of airspace disease in the left lower lobe measuring 16 by 8 mm on series 6 image 75.  Right upper lobe ground-glass nodule measuring 7 mm on series 6, image 48. Musculoskeletal: No acute or suspicious osseous abnormality. Review of the MIP images confirms the above findings. CT ABDOMEN and PELVIS FINDINGS Hepatobiliary: No focal liver abnormality is seen. No gallstones, gallbladder wall thickening, or biliary dilatation. Pancreas: Unremarkable. No pancreatic ductal dilatation or surrounding inflammatory changes. Spleen: Normal in size without focal abnormality. Adrenals/Urinary Tract: Left adrenal gland is normal. Further interval enlargement of right adrenal gland, measuring 8.7 x 6.9 cm, previously 7.7 cm. Cystic low-density area posteriorly on series 2, image 21 could reflect organizing hematoma. Interim finding of additional low-density collection measuring 5.6 x 4.6 cm on series 2, image 28 at the inferior aspect of the right adrenal gland, some surrounding low-density fluid which is also new or increased and is seen surrounding the upper pole of right kidney. Kidneys show no hydronephrosis. The bladder is unremarkable Stomach/Bowel: The stomach is nonenlarged. There  is no dilated small bowel. No acute bowel wall thickening. Appendix not well seen. Vascular/Lymphatic: Mild aortic atherosclerosis. No aneurysm. No suspicious lymph nodes. Reproductive: Bilateral tubal clips. Uterus unremarkable. No adnexal mass Other: Negative for pelvic effusion or free air. Small fat containing umbilical hernia Musculoskeletal: No acute or suspicious osseous abnormality. Review of the MIP images confirms the above findings. IMPRESSION: 1. Negative for acute pulmonary embolus. 2. 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 Multi disciplinary thoracic consultation is recommended. 3. Irregular focus of airspace disease in the left lower lobe measuring 16 x 8 mm, indeterminate in appearance for focus of infection, inflammation, or possible nodule. Additional 7 mm right upper lobe ground-glass nodule. Attention on follow-up imaging. 4. Further enlargement of the right adrenal gland since most recent prior, suspect that there is underlying right adrenal mass that is complicated by hemorrhage. Some of the measured right adrenal gland/mass is felt secondary to organizing hematoma. Since the most recent prior, interim development of additional low-density fluid at the inferior aspect of the right adrenal and surrounding the upper pole of right kidney which could be due to additional interval hemorrhage but there is no hyperdense blood at this time or evidence for extravasation. 5. Aortic atherosclerosis. Aortic Atherosclerosis (ICD10-I70.0). Electronically Signed   By: Jasmine Pang M.D.   On: 05/20/2023 19:17   CT Angio Chest PE W and/or Wo Contrast  Result Date: 05/20/2023 CLINICAL DATA:  Right flank and lower quadrant pain EXAM: CT ANGIOGRAPHY CHEST CT ABDOMEN AND PELVIS WITH CONTRAST TECHNIQUE: Multidetector CT imaging of the chest was performed using the standard protocol during bolus administration of intravenous contrast. Multiplanar CT image reconstructions  and MIPs were obtained to evaluate the vascular anatomy. Multidetector CT imaging of the abdomen and pelvis was performed using the standard protocol during bolus administration of intravenous contrast. RADIATION DOSE REDUCTION: This exam was performed according to the departmental dose-optimization program which includes automated exposure control, adjustment of the mA and/or kV according to patient size and/or use of iterative reconstruction technique. CONTRAST:  OMNIPAQUE IOHEXOL 350 MG/ML SOLN COMPARISON:  CT 05/07/2023, chest x-ray 05/07/2023, CT 03/28/2023, CT 11/29/2020 further enlargement of right adrenal gland now measuring 8.9 x 6.7 cm, most recent prior measurement of 7.7 by 6.2 cm. Low-density cystic area on series 2, image 22 posteriorly could reflect organizing hematoma. Interim finding of moderate slightly loculated fluid surrounding the superior pole right kidney, series 2, image 31, coronal series 4, image 84. Slight interval increase in right perinephric stranding. FINDINGS: CTA CHEST  FINDINGS Cardiovascular: Satisfactory opacification of the pulmonary arteries to the segmental level. No evidence of pulmonary embolism. Normal heart size. No pericardial effusion. Nonaneurysmal aorta. No dissection is seen. Mediastinum/Nodes: Midline trachea. No thyroid mass. Esophagus within normal limits. Lungs/Pleura: No pleural effusion or pneumothorax. Solid left hilar mass measuring approximately 5.3 by 3.9 cm with partial in case mint of left-sided pulmonary vessels. Mild narrowing of left lower lobe bronchi but no occlusion. Irregular focus of airspace disease in the left lower lobe measuring 16 by 8 mm on series 6 image 75. Right upper lobe ground-glass nodule measuring 7 mm on series 6, image 48. Musculoskeletal: No acute or suspicious osseous abnormality. Review of the MIP images confirms the above findings. CT ABDOMEN and PELVIS FINDINGS Hepatobiliary: No focal liver abnormality is seen. No  gallstones, gallbladder wall thickening, or biliary dilatation. Pancreas: Unremarkable. No pancreatic ductal dilatation or surrounding inflammatory changes. Spleen: Normal in size without focal abnormality. Adrenals/Urinary Tract: Left adrenal gland is normal. Further interval enlargement of right adrenal gland, measuring 8.7 x 6.9 cm, previously 7.7 cm. Cystic low-density area posteriorly on series 2, image 21 could reflect organizing hematoma. Interim finding of additional low-density collection measuring 5.6 x 4.6 cm on series 2, image 28 at the inferior aspect of the right adrenal gland, some surrounding low-density fluid which is also new or increased and is seen surrounding the upper pole of right kidney. Kidneys show no hydronephrosis. The bladder is unremarkable Stomach/Bowel: The stomach is nonenlarged. There is no dilated small bowel. No acute bowel wall thickening. Appendix not well seen. Vascular/Lymphatic: Mild aortic atherosclerosis. No aneurysm. No suspicious lymph nodes. Reproductive: Bilateral tubal clips. Uterus unremarkable. No adnexal mass Other: Negative for pelvic effusion or free air. Small fat containing umbilical hernia Musculoskeletal: No acute or suspicious osseous abnormality. Review of the MIP images confirms the above findings. IMPRESSION: 1. Negative for acute pulmonary embolus. 2. 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 Multi disciplinary thoracic consultation is recommended. 3. Irregular focus of airspace disease in the left lower lobe measuring 16 x 8 mm, indeterminate in appearance for focus of infection, inflammation, or possible nodule. Additional 7 mm right upper lobe ground-glass nodule. Attention on follow-up imaging. 4. Further enlargement of the right adrenal gland since most recent prior, suspect that there is underlying right adrenal mass that is complicated by hemorrhage. Some of the measured right adrenal gland/mass is felt secondary  to organizing hematoma. Since the most recent prior, interim development of additional low-density fluid at the inferior aspect of the right adrenal and surrounding the upper pole of right kidney which could be due to additional interval hemorrhage but there is no hyperdense blood at this time or evidence for extravasation. 5. Aortic atherosclerosis. Aortic Atherosclerosis (ICD10-I70.0). Electronically Signed   By: Jasmine Pang M.D.   On: 05/20/2023 19:17   US PELVIC COMPLETE WITH TRANSVAGINAL  Result Date: 05/19/2023 Images from the original result were not included.  ..an Financial trader of Ultrasound Medicine Technical sales engineer) accredited practice [facility] [address] [place] [address] Ordering Provider: Lazaro Arms, MD  GYNECOLOGIC SONOGRAM Miranda Perkins is a 62 y.o. [id number] No LMP recorded. Patient is postmenopausal. She is here for a pelvic sonogram for postmenopausal bleeding. Uterus                      6.8 x 4.7 x 5.9 cm, Total uterine volume 98 cc, heterogeneous, anteverted uterus with echogenic myometrial linear striations, ? Adenomyosis Endometrium          8.2 mm, symmetrical, avascular, thicken, homogenous endometrium Right ovary             2.6 x 1.7 x 1.9 cm, normal (limited view) Left ovary                2 x 2.4 x 2.2 cm, normal (limited view) No free fluid,multiple simple nabothian cysts Technician Comments: PELVIC US TA/TV: Heterogeneous anteverted uterus with echogenic myometrial linear striations, ? Adenomyosis,avascular thicken homogenous endometrium, EEC 8.2 mm,normal ovaries (limited view), no free fluid,no pain during ultrasound [employer] [address] Flora Lipps 05/19/2023 3:50 PM  Clinical Impression and recommendations: I have reviewed the sonogram results above, combined with the patient's current clinical course,  below are my impressions and any appropriate recommendations for management based on the sonographic findings. Uterus normal size shape and contour, adenomyosis Endometrium 8.2 mm no polyps homogenous no blood flow Ovaries: normal size shape and morphology for post menopausal woman Lazaro Arms 05/19/2023 4:26 PM   Procedures Procedures    Medications Ordered in ED Medications  sodium chloride 0.9 % bolus 1,000 mL (0 mLs Intravenous Stopped 05/20/23 1749)    And  0.9 %  sodium chloride infusion ( Intravenous New Bag/Given 05/20/23 1750)  HYDROmorphone (DILAUDID) injection 1 mg (1 mg Intravenous Given 05/20/23 1701)  ondansetron (ZOFRAN) injection 4 mg (4 mg Intravenous Given 05/20/23 1701)  iohexol (OMNIPAQUE) 350 MG/ML injection 100 mL (100 mLs Intravenous Contrast Given 05/20/23 1811)  HYDROmorphone (DILAUDID) injection 1 mg (1 mg Intravenous Given 05/20/23 1923)    ED Course/ Medical Decision Making/ A&P Clinical Course as of 05/20/23 1930  Thu May 20, 2023  1920 CT scan of her chest abdomen pelvis shows evidence of a hilar mass measuring up to 5.3 cm suspicious for malignancy.  Patient also has irregular focus of airspace disease in left lower lobe.  There is also evidence of additional enlargement of the right adrenal gland suspect that this is underlying adrenal mass complicated by hemorrhage. [JK]  1921 CBC metabolic panel normal.  Urine negative.  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 Decision-making details documented in ED Course. Radiology: ordered  and independent interpretation performed.  Risk Prescription drug management. Decision regarding hospitalization.   Patient was recently admitted to the hospital for an adrenal hemorrhage.  Patient unfortunately has continued to have pain associated with this.  She presented to the ED with complaints of increasing flank pain.  Patient also noted that she was having some intermittent hemoptysis.  No gross hemoptysis in the ED.  No respiratory difficulty.  I was concerned about the possibility ability of PE with her complaints of hemoptysis.  Patient CT scan does not show any evidence of PE.  She does not have leukocytosis to suggest infection.  She does not have a fever unfortunately the CT scan shows evidence of a hilar mass suggestive of malignancy.  There is also another focus of airspace disease in the left lower lobe.  This could be infection inflammation or nodule.  Patient also has further enlargement of her right adrenal gland.  Suspect there is an underlying mass there as well.  Patient has required repeat doses of IV narcotic pain medications to help control her pain.  With these new findings I will consult the medical service for admission.  She would benefit from oncology consultation.        Final Clinical Impression(s) / ED Diagnoses Final diagnoses:  Hilar mass  Lung mass  Adrenal mass [facility])    Rx / DC Orders ED Discharge Orders     None         Linwood Dibbles, MD 05/21/23 0003

## 2023-05-20 NOTE — ED Notes (Signed)
Rounding Pt stated pain increased to 4/10 and requests additional pain medication  Informed MD

## 2023-05-21 ENCOUNTER — Telehealth: Payer: Self-pay | Admitting: Internal Medicine

## 2023-05-21 DIAGNOSIS — R918 Other nonspecific abnormal finding of lung field: Secondary | ICD-10-CM

## 2023-05-21 DIAGNOSIS — E2749 Other adrenocortical insufficiency: Secondary | ICD-10-CM

## 2023-05-21 DIAGNOSIS — L719 Rosacea, unspecified: Secondary | ICD-10-CM | POA: Diagnosis not present

## 2023-05-21 DIAGNOSIS — R52 Pain, unspecified: Secondary | ICD-10-CM | POA: Diagnosis not present

## 2023-05-21 LAB — CBC
HCT: 34.5 % — ABNORMAL LOW (ref 36.0–46.0)
Hemoglobin: 11.1 g/dL — ABNORMAL LOW (ref 12.0–15.0)
MCH: 28.5 pg (ref 26.0–34.0)
MCHC: 32.2 g/dL (ref 30.0–36.0)
MCV: 88.5 fL (ref 80.0–100.0)
Platelets: 369 10*3/uL (ref 150–400)
RBC: 3.9 MIL/uL (ref 3.87–5.11)
RDW: 13.1 % (ref 11.5–15.5)
WBC: 7.8 10*3/uL (ref 4.0–10.5)
nRBC: 0 % (ref 0.0–0.2)

## 2023-05-21 LAB — COMPREHENSIVE METABOLIC PANEL
ALT: 34 U/L (ref 0–44)
AST: 16 U/L (ref 15–41)
Albumin: 3.3 g/dL — ABNORMAL LOW (ref 3.5–5.0)
Alkaline Phosphatase: 67 U/L (ref 38–126)
Anion gap: 7 (ref 5–15)
BUN: 11 mg/dL (ref 8–23)
CO2: 27 mmol/L (ref 22–32)
Calcium: 8.5 mg/dL — ABNORMAL LOW (ref 8.9–10.3)
Chloride: 103 mmol/L (ref 98–111)
Creatinine, Ser: 0.78 mg/dL (ref 0.44–1.00)
GFR, Estimated: >60 mL/min (ref >60)
Glucose, Bld: 94 mg/dL (ref 70–99)
Potassium: 3.4 mmol/L — ABNORMAL LOW (ref 3.5–5.1)
Sodium: 137 mmol/L (ref 135–145)
Total Bilirubin: 0.6 mg/dL (ref 0.3–1.2)
Total Protein: 6.5 g/dL (ref 6.5–8.1)

## 2023-05-21 LAB — MAGNESIUM: Magnesium: 2.1 mg/dL (ref 1.7–2.4)

## 2023-05-21 LAB — HIV ANTIBODY (ROUTINE TESTING W REFLEX): HIV Screen 4th Generation wRfx: NONREACTIVE

## 2023-05-21 MED ORDER — FENTANYL CITRATE PF 50 MCG/ML IJ SOSY
75.0000 ug | PREFILLED_SYRINGE | Freq: Once | INTRAMUSCULAR | Status: AC
  Administered 2023-05-21: 75 ug via INTRAVENOUS

## 2023-05-21 MED ORDER — ACETAMINOPHEN 325 MG PO TABS: 650.0000 mg | ORAL_TABLET | Freq: Three times a day (TID) | ORAL | Status: DC

## 2023-05-21 MED ORDER — OXYCODONE HCL 5 MG PO TABS: 5.0000 mg | ORAL_TABLET | ORAL | 0 refills | Status: DC | PRN

## 2023-05-21 MED ORDER — POTASSIUM CHLORIDE CRYS ER 20 MEQ PO TBCR: 40.0000 meq | EXTENDED_RELEASE_TABLET | Freq: Once | ORAL | Status: DC

## 2023-05-21 MED ORDER — FENTANYL CITRATE PF 50 MCG/ML IJ SOSY
PREFILLED_SYRINGE | INTRAMUSCULAR | Status: AC
  Filled 2023-05-21: qty 2

## 2023-05-21 NOTE — Telephone Encounter (Signed)
Made pt. Apt on 7.30.24 @ 3:45pm to see DR. Byrum

## 2023-05-21 NOTE — Telephone Encounter (Signed)
Put her in the 15 minutes slot 7/31 please

## 2023-05-21 NOTE — Telephone Encounter (Signed)
Miranda Perkins  Has hilar mass and adrneal met Is in [place]  Inpatient right now  Plan  -opd pet scan and see Icard.Byrum. Dewald or anyone who can do EBUS all within2 weeks   CT ABDOMEN PELVIS W CONTRAST  Result Date: 05/20/2023 CLINICAL DATA:  Right flank and lower quadrant pain EXAM: CT ANGIOGRAPHY CHEST CT ABDOMEN AND PELVIS WITH CONTRAST TECHNIQUE: Multidetector CT imaging of the chest was performed using the standard protocol during bolus administration of intravenous contrast. Multiplanar CT image reconstructions and MIPs were obtained to evaluate the vascular anatomy. Multidetector CT imaging of the abdomen and pelvis was performed using the standard protocol during bolus administration of intravenous contrast. RADIATION DOSE REDUCTION: This exam was performed according to the departmental dose-optimization program which includes automated exposure control, adjustment of the mA and/or kV according to patient size and/or use of iterative reconstruction technique. CONTRAST:  OMNIPAQUE IOHEXOL 350 MG/ML SOLN COMPARISON:  CT 05/07/2023, chest x-ray 05/07/2023, CT 03/28/2023, CT 11/29/2020 further enlargement of right adrenal gland now measuring 8.9 x 6.7 cm, most recent prior measurement of 7.7 by 6.2 cm. Low-density cystic area on series 2, image 22 posteriorly could reflect organizing hematoma. Interim finding of moderate slightly loculated fluid surrounding the superior pole right kidney, series 2, image 31, coronal series 4, image 84. Slight interval increase in right perinephric stranding. FINDINGS: CTA CHEST FINDINGS Cardiovascular: Satisfactory opacification of the pulmonary arteries to the segmental level. No evidence of pulmonary embolism. Normal heart size. No pericardial effusion. Nonaneurysmal aorta. No dissection is seen. Mediastinum/Nodes: Midline trachea. No thyroid mass. Esophagus within normal limits. Lungs/Pleura: No pleural effusion or pneumothorax. Solid left hilar  mass measuring approximately 5.3 by 3.9 cm with partial in case mint of left-sided pulmonary vessels. Mild narrowing of left lower lobe bronchi but no occlusion. Irregular focus of airspace disease in the left lower lobe measuring 16 by 8 mm on series 6 image 75. Right upper lobe ground-glass nodule measuring 7 mm on series 6, image 48. Musculoskeletal: No acute or suspicious osseous abnormality. Review of the MIP images confirms the above findings. CT ABDOMEN and PELVIS FINDINGS Hepatobiliary: No focal liver abnormality is seen. No gallstones, gallbladder wall thickening, or biliary dilatation. Pancreas: Unremarkable. No pancreatic ductal dilatation or surrounding inflammatory changes. Spleen: Normal in size without focal abnormality. Adrenals/Urinary Tract: Left adrenal gland is normal. Further interval enlargement of right adrenal gland, measuring 8.7 x 6.9 cm, previously 7.7 cm. Cystic low-density area posteriorly on series 2, image 21 could reflect organizing hematoma. Interim finding of additional low-density collection measuring 5.6 x 4.6 cm on series 2, image 28 at the inferior aspect of the right adrenal gland, some surrounding low-density fluid which is also new or increased and is seen surrounding the upper pole of right kidney. Kidneys show no hydronephrosis. The bladder is unremarkable Stomach/Bowel: The stomach is nonenlarged. There is no dilated small bowel. No acute bowel wall thickening. Appendix not well seen. Vascular/Lymphatic: Mild aortic atherosclerosis. No aneurysm. No suspicious lymph nodes. Reproductive: Bilateral tubal clips. Uterus unremarkable. No adnexal mass Other: Negative for pelvic effusion or free air. Small fat containing umbilical hernia Musculoskeletal: No acute or suspicious osseous abnormality. Review of the MIP images confirms the above findings. IMPRESSION: 1. Negative for acute pulmonary embolus. 2. 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 Multi disciplinary thoracic consultation is recommended. 3. Irregular focus of airspace disease in the left lower lobe measuring 16 x 8 mm, indeterminate in  appearance for focus of infection, inflammation, or possible nodule. Additional 7 mm right upper lobe ground-glass nodule. Attention on follow-up imaging. 4. Further enlargement of the right adrenal gland since most recent prior, suspect that there is underlying right adrenal mass that is complicated by hemorrhage. Some of the measured right adrenal gland/mass is felt secondary to organizing hematoma. Since the most recent prior, interim development of additional low-density fluid at the inferior aspect of the right adrenal and surrounding the upper pole of right kidney which could be due to additional interval hemorrhage but there is no hyperdense blood at this time or evidence for extravasation. 5. Aortic atherosclerosis. Aortic Atherosclerosis (ICD10-I70.0). Electronically Signed   By: Jasmine Pang M.D.   On: 05/20/2023 19:17   CT Angio Chest PE W and/or Wo Contrast  Result Date: 05/20/2023 CLINICAL DATA:  Right flank and lower quadrant pain EXAM: CT ANGIOGRAPHY CHEST CT ABDOMEN AND PELVIS WITH CONTRAST TECHNIQUE: Multidetector CT imaging of the chest was performed using the standard protocol during bolus administration of intravenous contrast. Multiplanar CT image reconstructions and MIPs were obtained to evaluate the vascular anatomy. Multidetector CT imaging of the abdomen and pelvis was performed using the standard protocol during bolus administration of intravenous contrast. RADIATION DOSE REDUCTION: This exam was performed according to the departmental dose-optimization program which includes automated exposure control, adjustment of the mA and/or kV according to patient size and/or use of iterative reconstruction technique. CONTRAST:  OMNIPAQUE IOHEXOL 350 MG/ML SOLN COMPARISON:  CT 05/07/2023, chest x-ray 05/07/2023, CT  03/28/2023, CT 11/29/2020 further enlargement of right adrenal gland now measuring 8.9 x 6.7 cm, most recent prior measurement of 7.7 by 6.2 cm. Low-density cystic area on series 2, image 22 posteriorly could reflect organizing hematoma. Interim finding of moderate slightly loculated fluid surrounding the superior pole right kidney, series 2, image 31, coronal series 4, image 84. Slight interval increase in right perinephric stranding. FINDINGS: CTA CHEST FINDINGS Cardiovascular: Satisfactory opacification of the pulmonary arteries to the segmental level. No evidence of pulmonary embolism. Normal heart size. No pericardial effusion. Nonaneurysmal aorta. No dissection is seen. Mediastinum/Nodes: Midline trachea. No thyroid mass. Esophagus within normal limits. Lungs/Pleura: No pleural effusion or pneumothorax. Solid left hilar mass measuring approximately 5.3 by 3.9 cm with partial in case mint of left-sided pulmonary vessels. Mild narrowing of left lower lobe bronchi but no occlusion. Irregular focus of airspace disease in the left lower lobe measuring 16 by 8 mm on series 6 image 75. Right upper lobe ground-glass nodule measuring 7 mm on series 6, image 48. Musculoskeletal: No acute or suspicious osseous abnormality. Review of the MIP images confirms the above findings. CT ABDOMEN and PELVIS FINDINGS Hepatobiliary: No focal liver abnormality is seen. No gallstones, gallbladder wall thickening, or biliary dilatation. Pancreas: Unremarkable. No pancreatic ductal dilatation or surrounding inflammatory changes. Spleen: Normal in size without focal abnormality. Adrenals/Urinary Tract: Left adrenal gland is normal. Further interval enlargement of right adrenal gland, measuring 8.7 x 6.9 cm, previously 7.7 cm. Cystic low-density area posteriorly on series 2, image 21 could reflect organizing hematoma. Interim finding of additional low-density collection measuring 5.6 x 4.6 cm on series 2, image 28 at the inferior aspect  of the right adrenal gland, some surrounding low-density fluid which is also new or increased and is seen surrounding the upper pole of right kidney. Kidneys show no hydronephrosis. The bladder is unremarkable Stomach/Bowel: The stomach is nonenlarged. There is no dilated small bowel. No acute bowel wall thickening. Appendix not well  seen. Vascular/Lymphatic: Mild aortic atherosclerosis. No aneurysm. No suspicious lymph nodes. Reproductive: Bilateral tubal clips. Uterus unremarkable. No adnexal mass Other: Negative for pelvic effusion or free air. Small fat containing umbilical hernia Musculoskeletal: No acute or suspicious osseous abnormality. Review of the MIP images confirms the above findings. IMPRESSION: 1. Negative for acute pulmonary embolus. 2. 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 Multi disciplinary thoracic consultation is recommended. 3. Irregular focus of airspace disease in the left lower lobe measuring 16 x 8 mm, indeterminate in appearance for focus of infection, inflammation, or possible nodule. Additional 7 mm right upper lobe ground-glass nodule. Attention on follow-up imaging. 4. Further enlargement of the right adrenal gland since most recent prior, suspect that there is underlying right adrenal mass that is complicated by hemorrhage. Some of the measured right adrenal gland/mass is felt secondary to organizing hematoma. Since the most recent prior, interim development of additional low-density fluid at the inferior aspect of the right adrenal and surrounding the upper pole of right kidney which could be due to additional interval hemorrhage but there is no hyperdense blood at this time or evidence for extravasation. 5. Aortic atherosclerosis. Aortic Atherosclerosis (ICD10-I70.0). Electronically Signed   By: Jasmine Pang M.D.   On: 05/20/2023 19:17   US PELVIC COMPLETE WITH TRANSVAGINAL  Result Date: 05/19/2023 Images from the original result were not  included.  ..an Financial trader of Ultrasound Medicine Technical sales engineer) accredited practice Center for [facility] [address] [place] [address] Ordering Provider: Lazaro Arms, MD                                                                                                                                   GYNECOLOGIC SONOGRAM Miranda Perkins is a 62 y.o. [phone] No LMP recorded. Patient is postmenopausal. She is here for a pelvic sonogram for postmenopausal bleeding. Uterus                      6.8 x 4.7 x 5.9 cm, Total uterine volume 98 cc, heterogeneous, anteverted uterus with echogenic myometrial linear striations, ? Adenomyosis Endometrium          8.2 mm, symmetrical, avascular, thicken, homogenous endometrium Right ovary             2.6 x 1.7 x 1.9 cm, normal (limited view) Left ovary                2 x 2.4 x 2.2 cm, normal (limited view) No free fluid,multiple simple nabothian cysts Technician Comments: PELVIC US TA/TV: Heterogeneous anteverted uterus with echogenic myometrial linear striations, ? Adenomyosis,avascular thicken homogenous endometrium, EEC 8.2 mm,normal ovaries (limited view), no free fluid,no pain during ultrasound [employer] [id number] Flora Lipps 05/19/2023 3:50 PM  Clinical Impression and recommendations: I have reviewed the sonogram results above, combined with the patient's current clinical course, below  are my impressions and any appropriate recommendations for management based on the sonographic findings. Uterus normal size shape and contour, adenomyosis Endometrium 8.2 mm no polyps homogenous no blood flow Ovaries: normal size shape and morphology for post menopausal woman Lazaro Arms 05/19/2023 4:26 PM

## 2023-05-21 NOTE — Discharge Summary (Signed)
Physician Discharge Summary  Miranda Perkins ZOX:[id number] DOB: 1961/10/09 DOA: 05/20/2023  PCP: [name], DO Oncologist: Dr. Ellin Saba   Admit date: 05/20/2023 Discharge date: 05/21/2023  Admitted From:  HOME  Disposition:  HOME   Recommendations for Outpatient Follow-up:  Follow up with PCP in 1 weeks Follow up with Dr. Ellin Saba in 2-3 weeks (after bronchoscopy)  Follow up with CT surgery for PET scan and pulmonary (they will call pt in 2-3 days)   Discharge Condition: STABLE   CODE STATUS: FULL DIET: resume prior home diet    Brief Hospitalization Summary: Please see all hospital notes, images, labs for full details of the hospitalization. Admission provider HPI:  62 y.o. female with medical history significant of adrenal hemorrhage, CVA, thyroid nodules, rosacea, and more presents to the ED with a chief complaint of flank pain.  Patient reports that this all started on 25 May.  She was diagnosed with an adrenal bleed at that time She reports it all started 5/25. She was diagnosed with an adrenal hemorrhage at that time. Patient reports that she was discharged home, and her pain was uncontrolled. She was taking large amounts of [place] pain. NSAID at that time. She followed up with heme and was prescribed percocet. They did follow up imaging that showed that her hemorrhage was resolving. They also did hypercoagulability testing, but it does not seem like she followed up with them after that. In June she went to [facility] because of worsening pain. She had reported hemoptysis at that time.She reports that she has followed with her PCP for the same thing since then, but her pain has never been controlled. She describes her pain was right sided pain that radiates to back and to front. The pain is constant. It is achy. It is worse with movement and better with IV opiate med given in the ED. Morphine and Oxycodone do not touch the pain her report. She describes it as severe. She has  had associated nausea but no vomiting. Her last meal was day before yesterday. Her last BM was 3 days ago. It is normal for her to have a BM every day. She reports that she has not had much PO intake because of the nausea and the pain. Patient reports that her hemoptysis is intermittent. It is light red blood seen on tissue per her report. She denies symptoms of anemia. She has no hematuria or dysuria. Patient has no other complaints at this time.  HOSPITAL COURSE BY PROBLEM Pt was admitted with uncontrolled pain from known right adrenal mass. She was found by CT to have a left hilar mass that is suspicious for malignancy.  She was having very mild hemoptysis.  She was given pain management with oxycodone and it seems to be controlling her pain well.  I reached out to general surgery and Dr. Lovell Sheehan recommended I contact [facility] in [place] and I spoke with Dr Marchelle Gearing with [facility] and he recommended and made arrangements for outpatient follow up for PET scan and EBUS. I also spoke with Dr. Ellin Saba who says as long as pain controlled can DC home with pain medication and he would like to see in office after she has had biopsy done and PET scan done. I spoke with patient and verbalized the recommendations and plans to patient and she verbalized understanding.     Discharge Diagnoses:  Principal Problem:   Intractable pain Active Problems:   Rosacea   Adrenal hemorrhage (HCC)   Hilar mass  Discharge Instructions: Discharge Instructions     Ambulatory referral to Hematology / Oncology   Complete by: As directed    2 weeks hospital follow up per Dr. Kirtland Bouchard      Allergies as of 05/21/2023       Reactions   Influenza Vaccines Anaphylaxis, Other (See Comments)   Per patient   Penicillins Anaphylaxis, Other (See Comments)   PATIENT HAS HAD A PCN REACTION WITH IMMEDIATE RASH, FACIAL/TONGUE/THROAT SWELLING, SOB, OR LIGHTHEADEDNESS WITH HYPOTENSION:  #  #  #  YES  #  #  #   Has patient had a PCN  reaction causing severe rash involving mucus membranes or skin necrosis: No PATIENT HAS HAD A PCN REACTION THAT REQUIRED HOSPITALIZATION:  #  #  #  YES  #  #  #  Has patient had a PCN reaction occurring within the last 10 years: No   Articaine Other (See Comments)   Caused infection   Cortisone Other (See Comments)   Turned red and ran a low grade fever for 3 days   Other Rash   bandaids- skin turns red and a rash         Medication List     STOP taking these medications    HYDROcodone-acetaminophen 5-325 MG tablet Commonly known as: NORCO/VICODIN       TAKE these medications    acetaminophen 325 MG tablet Commonly known as: TYLENOL Take 2 tablets (650 mg total) by mouth in the morning, at noon, and at bedtime.   albuterol 108 (90 Base) MCG/ACT inhaler Commonly known as: VENTOLIN HFA Inhale 2 puffs into the lungs every 6 (six) hours as needed.   furosemide 40 MG tablet Commonly known as: LASIX Take 1.5 tablets (60 mg total) by mouth daily.   oxyCODONE 5 MG immediate release tablet Commonly known as: Oxy IR/ROXICODONE Take 1-2 tablets (5-10 mg total) by mouth every 4 (four) hours as needed for moderate pain, severe pain or breakthrough pain.   prochlorperazine 10 MG tablet Commonly known as: COMPAZINE Take 1 tablet (10 mg total) by mouth every 6 (six) hours as needed for nausea or vomiting.   progesterone 200 MG capsule Commonly known as: Prometrium Nightly 10 days every 3 months        Follow-up Information     Delynn Flavin M, DO. Schedule an appointment as soon as possible for a visit in 1 week(s).   Specialty: Family Medicine Why: Hospital Follow Up Contact information: [address] [place] [address] [phone]         [name], MD. Schedule an appointment as soon as possible for a visit in 2 week(s).   Specialty: Hematology Why: Hospital Follow Up Contact information: [address] [place] [address] [phone]          Pulmonology and cardiothoracic surgery Follow up.   Why: The will call you in about 3 days to set up appt for PET scan and bronchoscopy procedures               Allergies  Allergen Reactions   Influenza Vaccines Anaphylaxis and Other (See Comments)    Per patient   Penicillins Anaphylaxis and Other (See Comments)    PATIENT HAS HAD A PCN REACTION WITH IMMEDIATE RASH, FACIAL/TONGUE/THROAT SWELLING, SOB, OR LIGHTHEADEDNESS WITH HYPOTENSION:  #  #  #  YES  #  #  #   Has patient had a PCN reaction causing severe rash involving mucus membranes or skin  necrosis: No PATIENT HAS HAD A PCN REACTION THAT REQUIRED HOSPITALIZATION:  #  #  #  YES  #  #  #  Has patient had a PCN reaction occurring within the last 10 years: No    Articaine Other (See Comments)    Caused infection   Cortisone Other (See Comments)    Turned red and ran a low grade fever for 3 days   Other Rash    bandaids- skin turns red and a rash    Allergies as of 05/21/2023       Reactions   Influenza Vaccines Anaphylaxis, Other (See Comments)   Per patient   Penicillins Anaphylaxis, Other (See Comments)   PATIENT HAS HAD A PCN REACTION WITH IMMEDIATE RASH, FACIAL/TONGUE/THROAT SWELLING, SOB, OR LIGHTHEADEDNESS WITH HYPOTENSION:  #  #  #  YES  #  #  #   Has patient had a PCN reaction causing severe rash involving mucus membranes or skin necrosis: No PATIENT HAS HAD A PCN REACTION THAT REQUIRED HOSPITALIZATION:  #  #  #  YES  #  #  #  Has patient had a PCN reaction occurring within the last 10 years: No   Articaine Other (See Comments)   Caused infection   Cortisone Other (See Comments)   Turned red and ran a low grade fever for 3 days   Other Rash   bandaids- skin turns red and a rash         Medication List     STOP taking these medications    HYDROcodone-acetaminophen 5-325 MG tablet Commonly known as: NORCO/VICODIN       TAKE these medications    acetaminophen 325 MG tablet Commonly known  as: TYLENOL Take 2 tablets (650 mg total) by mouth in the morning, at noon, and at bedtime.   albuterol 108 (90 Base) MCG/ACT inhaler Commonly known as: VENTOLIN HFA Inhale 2 puffs into the lungs every 6 (six) hours as needed.   furosemide 40 MG tablet Commonly known as: LASIX Take 1.5 tablets (60 mg total) by mouth daily.   oxyCODONE 5 MG immediate release tablet Commonly known as: Oxy IR/ROXICODONE Take 1-2 tablets (5-10 mg total) by mouth every 4 (four) hours as needed for moderate pain, severe pain or breakthrough pain.   prochlorperazine 10 MG tablet Commonly known as: COMPAZINE Take 1 tablet (10 mg total) by mouth every 6 (six) hours as needed for nausea or vomiting.   progesterone 200 MG capsule Commonly known as: Prometrium Nightly 10 days every 3 months        Procedures/Studies: CT ABDOMEN PELVIS W CONTRAST  Result Date: 05/20/2023 CLINICAL DATA:  Right flank and lower quadrant pain EXAM: CT ANGIOGRAPHY CHEST CT ABDOMEN AND PELVIS WITH CONTRAST TECHNIQUE: Multidetector CT imaging of the chest was performed using the standard protocol during bolus administration of intravenous contrast. Multiplanar CT image reconstructions and MIPs were obtained to evaluate the vascular anatomy. Multidetector CT imaging of the abdomen and pelvis was performed using the standard protocol during bolus administration of intravenous contrast. RADIATION DOSE REDUCTION: This exam was performed according to the departmental dose-optimization program which includes automated exposure control, adjustment of the mA and/or kV according to patient size and/or use of iterative reconstruction technique. CONTRAST:  OMNIPAQUE IOHEXOL 350 MG/ML SOLN COMPARISON:  CT 05/07/2023, chest x-ray 05/07/2023, CT 03/28/2023, CT 11/29/2020 further enlargement of right adrenal gland now measuring 8.9 x 6.7 cm, most recent prior measurement of 7.7 by 6.2 cm. Low-density cystic  area on series 2, image 22 posteriorly  could reflect organizing hematoma. Interim finding of moderate slightly loculated fluid surrounding the superior pole right kidney, series 2, image 31, coronal series 4, image 84. Slight interval increase in right perinephric stranding. FINDINGS: CTA CHEST FINDINGS Cardiovascular: Satisfactory opacification of the pulmonary arteries to the segmental level. No evidence of pulmonary embolism. Normal heart size. No pericardial effusion. Nonaneurysmal aorta. No dissection is seen. Mediastinum/Nodes: Midline trachea. No thyroid mass. Esophagus within normal limits. Lungs/Pleura: No pleural effusion or pneumothorax. Solid left hilar mass measuring approximately 5.3 by 3.9 cm with partial in case mint of left-sided pulmonary vessels. Mild narrowing of left lower lobe bronchi but no occlusion. Irregular focus of airspace disease in the left lower lobe measuring 16 by 8 mm on series 6 image 75. Right upper lobe ground-glass nodule measuring 7 mm on series 6, image 48. Musculoskeletal: No acute or suspicious osseous abnormality. Review of the MIP images confirms the above findings. CT ABDOMEN and PELVIS FINDINGS Hepatobiliary: No focal liver abnormality is seen. No gallstones, gallbladder wall thickening, or biliary dilatation. Pancreas: Unremarkable. No pancreatic ductal dilatation or surrounding inflammatory changes. Spleen: Normal in size without focal abnormality. Adrenals/Urinary Tract: Left adrenal gland is normal. Further interval enlargement of right adrenal gland, measuring 8.7 x 6.9 cm, previously 7.7 cm. Cystic low-density area posteriorly on series 2, image 21 could reflect organizing hematoma. Interim finding of additional low-density collection measuring 5.6 x 4.6 cm on series 2, image 28 at the inferior aspect of the right adrenal gland, some surrounding low-density fluid which is also new or increased and is seen surrounding the upper pole of right kidney. Kidneys show no hydronephrosis. The bladder is  unremarkable Stomach/Bowel: The stomach is nonenlarged. There is no dilated small bowel. No acute bowel wall thickening. Appendix not well seen. Vascular/Lymphatic: Mild aortic atherosclerosis. No aneurysm. No suspicious lymph nodes. Reproductive: Bilateral tubal clips. Uterus unremarkable. No adnexal mass Other: Negative for pelvic effusion or free air. Small fat containing umbilical hernia Musculoskeletal: No acute or suspicious osseous abnormality. Review of the MIP images confirms the above findings. IMPRESSION: 1. Negative for acute pulmonary embolus. 2. 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 Multi disciplinary thoracic consultation is recommended. 3. Irregular focus of airspace disease in the left lower lobe measuring 16 x 8 mm, indeterminate in appearance for focus of infection, inflammation, or possible nodule. Additional 7 mm right upper lobe ground-glass nodule. Attention on follow-up imaging. 4. Further enlargement of the right adrenal gland since most recent prior, suspect that there is underlying right adrenal mass that is complicated by hemorrhage. Some of the measured right adrenal gland/mass is felt secondary to organizing hematoma. Since the most recent prior, interim development of additional low-density fluid at the inferior aspect of the right adrenal and surrounding the upper pole of right kidney which could be due to additional interval hemorrhage but there is no hyperdense blood at this time or evidence for extravasation. 5. Aortic atherosclerosis. Aortic Atherosclerosis (ICD10-I70.0). Electronically Signed   By: Jasmine Pang M.D.   On: 05/20/2023 19:17   CT Angio Chest PE W and/or Wo Contrast  Result Date: 05/20/2023 CLINICAL DATA:  Right flank and lower quadrant pain EXAM: CT ANGIOGRAPHY CHEST CT ABDOMEN AND PELVIS WITH CONTRAST TECHNIQUE: Multidetector CT imaging of the chest was performed using the standard protocol during bolus administration of  intravenous contrast. Multiplanar CT image reconstructions and MIPs were obtained to evaluate the vascular anatomy. Multidetector  CT imaging of the abdomen and pelvis was performed using the standard protocol during bolus administration of intravenous contrast. RADIATION DOSE REDUCTION: This exam was performed according to the departmental dose-optimization program which includes automated exposure control, adjustment of the mA and/or kV according to patient size and/or use of iterative reconstruction technique. CONTRAST:  OMNIPAQUE IOHEXOL 350 MG/ML SOLN COMPARISON:  CT 05/07/2023, chest x-ray 05/07/2023, CT 03/28/2023, CT 11/29/2020 further enlargement of right adrenal gland now measuring 8.9 x 6.7 cm, most recent prior measurement of 7.7 by 6.2 cm. Low-density cystic area on series 2, image 22 posteriorly could reflect organizing hematoma. Interim finding of moderate slightly loculated fluid surrounding the superior pole right kidney, series 2, image 31, coronal series 4, image 84. Slight interval increase in right perinephric stranding. FINDINGS: CTA CHEST FINDINGS Cardiovascular: Satisfactory opacification of the pulmonary arteries to the segmental level. No evidence of pulmonary embolism. Normal heart size. No pericardial effusion. Nonaneurysmal aorta. No dissection is seen. Mediastinum/Nodes: Midline trachea. No thyroid mass. Esophagus within normal limits. Lungs/Pleura: No pleural effusion or pneumothorax. Solid left hilar mass measuring approximately 5.3 by 3.9 cm with partial in case mint of left-sided pulmonary vessels. Mild narrowing of left lower lobe bronchi but no occlusion. Irregular focus of airspace disease in the left lower lobe measuring 16 by 8 mm on series 6 image 75. Right upper lobe ground-glass nodule measuring 7 mm on series 6, image 48. Musculoskeletal: No acute or suspicious osseous abnormality. Review of the MIP images confirms the above findings. CT ABDOMEN and PELVIS FINDINGS  Hepatobiliary: No focal liver abnormality is seen. No gallstones, gallbladder wall thickening, or biliary dilatation. Pancreas: Unremarkable. No pancreatic ductal dilatation or surrounding inflammatory changes. Spleen: Normal in size without focal abnormality. Adrenals/Urinary Tract: Left adrenal gland is normal. Further interval enlargement of right adrenal gland, measuring 8.7 x 6.9 cm, previously 7.7 cm. Cystic low-density area posteriorly on series 2, image 21 could reflect organizing hematoma. Interim finding of additional low-density collection measuring 5.6 x 4.6 cm on series 2, image 28 at the inferior aspect of the right adrenal gland, some surrounding low-density fluid which is also new or increased and is seen surrounding the upper pole of right kidney. Kidneys show no hydronephrosis. The bladder is unremarkable Stomach/Bowel: The stomach is nonenlarged. There is no dilated small bowel. No acute bowel wall thickening. Appendix not well seen. Vascular/Lymphatic: Mild aortic atherosclerosis. No aneurysm. No suspicious lymph nodes. Reproductive: Bilateral tubal clips. Uterus unremarkable. No adnexal mass Other: Negative for pelvic effusion or free air. Small fat containing umbilical hernia Musculoskeletal: No acute or suspicious osseous abnormality. Review of the MIP images confirms the above findings. IMPRESSION: 1. Negative for acute pulmonary embolus. 2. 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 Multi disciplinary thoracic consultation is recommended. 3. Irregular focus of airspace disease in the left lower lobe measuring 16 x 8 mm, indeterminate in appearance for focus of infection, inflammation, or possible nodule. Additional 7 mm right upper lobe ground-glass nodule. Attention on follow-up imaging. 4. Further enlargement of the right adrenal gland since most recent prior, suspect that there is underlying right adrenal mass that is complicated by hemorrhage. Some of  the measured right adrenal gland/mass is felt secondary to organizing hematoma. Since the most recent prior, interim development of additional low-density fluid at the inferior aspect of the right adrenal and surrounding the upper pole of right kidney which could be due to additional interval hemorrhage but there is no hyperdense  blood at this time or evidence for extravasation. 5. Aortic atherosclerosis. Aortic Atherosclerosis (ICD10-I70.0). Electronically Signed   By: Jasmine Pang M.D.   On: 05/20/2023 19:17   US PELVIC COMPLETE WITH TRANSVAGINAL  Result Date: 05/19/2023 Images from the original result were not included.  ..an Financial trader of Ultrasound Medicine Technical sales engineer) accredited practice [facility] [address] [place] [address] Ordering Provider: Lazaro Arms, MD                                                                                                                                   GYNECOLOGIC SONOGRAM SUHAYLA CHISOM is a 62 y.o. [phone] No LMP recorded. Patient is postmenopausal. She is here for a pelvic sonogram for postmenopausal bleeding. Uterus                      6.8 x 4.7 x 5.9 cm, Total uterine volume 98 cc, heterogeneous, anteverted uterus with echogenic myometrial linear striations, ? Adenomyosis Endometrium          8.2 mm, symmetrical, avascular, thicken, homogenous endometrium Right ovary             2.6 x 1.7 x 1.9 cm, normal (limited view) Left ovary                2 x 2.4 x 2.2 cm, normal (limited view) No free fluid,multiple simple nabothian cysts Technician Comments: PELVIC US TA/TV: Heterogeneous anteverted uterus with echogenic myometrial linear striations, ? Adenomyosis,avascular thicken homogenous endometrium, EEC 8.2 mm,normal ovaries (limited view), no free fluid,no pain during ultrasound Chaperone [address] Flora Lipps 05/19/2023 3:50 PM  Clinical Impression and recommendations: I have reviewed the sonogram results above,  combined with the patient's current clinical course, below are my impressions and any appropriate recommendations for management based on the sonographic findings. Uterus normal size shape and contour, adenomyosis Endometrium 8.2 mm no polyps homogenous no blood flow Ovaries: normal size shape and morphology for post menopausal woman Lazaro Arms 05/19/2023 4:26 PM  US THYROID  Result Date: 05/12/2023 CLINICAL DATA:  Multinodular goiter follow-up EXAM: THYROID ULTRASOUND TECHNIQUE: Ultrasound examination of the thyroid gland and adjacent soft tissues was performed. COMPARISON:  07/17/2021 FINDINGS: Parenchymal Echotexture: Mildly heterogenous Isthmus: 0.2 cm Right lobe: 5.0 x 2.0 x 1.8 cm Left lobe: 3.4 x 2.6 x 1.3 cm _________________________________________________________ Estimated total number of nodules >/= 1 cm: 2 Number of spongiform nodules >/=  2 cm not described below (TR1): 0 Number of mixed cystic and solid nodules >/= 1.5 cm not described below (TR2): 0 _________________________________________________________ Nodule 1: 1.4 x 1.0 x 0.9 cm right superior thyroid nodule is not significantly changed in size since 03/26/2016 where it measured 1.3 x 0.8 x 1.1 cm, consistent with benign etiology. Nodule 2: 2.0 x 1.2 x 1.1 cm right mid thyroid nodule is not significantly changed since 03/26/2016 where  it measured 1.7 x 0.9 x 1.4 cm, consistent with benign etiology. IMPRESSION: 1. Right thyroid nodules are unchanged in size since 03/26/2016, consistent with benign etiology. 2. No new thyroid nodule. The above is in keeping with the ACR TI-RADS recommendations - J Am Coll Radiol 2017;14:587-595. Electronically Signed   By: Acquanetta Belling M.D.   On: 05/12/2023 09:46   CT ABDOMEN PELVIS W CONTRAST  Result Date: 05/07/2023 CLINICAL DATA:  Acute abdominal pain on the right, initial encounter EXAM: CT ABDOMEN AND PELVIS WITH CONTRAST TECHNIQUE: Multidetector CT imaging of the abdomen and pelvis was performed  using the standard protocol following bolus administration of intravenous contrast. RADIATION DOSE REDUCTION: This exam was performed according to the departmental dose-optimization program which includes automated exposure control, adjustment of the mA and/or kV according to patient size and/or use of iterative reconstruction technique. CONTRAST:  OMNIPAQUE IOHEXOL 300 MG/ML  SOLN COMPARISON:  03/28/2023 FINDINGS: Lower chest: No acute abnormality. Hepatobiliary: Fatty infiltration of the liver is noted. Gallbladder is within normal limits. Pancreas: Pancreas is unremarkable. Spleen: Normal in size without focal abnormality. Adrenals/Urinary Tract: Left adrenal gland is within normal limits. Right adrenal gland demonstrates significant enlargement now measuring 7.6 cm in greatest dimension. This is increased from the prior exam at which time it measured 4.9 cm in greatest dimension. Areas of central decreased attenuation are noted likely related to resolving hemorrhage given the patient's clinical history. No acute increased attenuation is identified to suggest active hemorrhage at this time. Kidneys are well visualize within normal enhancement pattern. No obstructive changes are seen. The bladder is partially distended. Stomach/Bowel: Mild diverticular change of the colon is noted without evidence of diverticulitis. The appendix is not well visualized. No inflammatory changes to suggest appendicitis are noted. Stomach and small bowel are within normal limits. Vascular/Lymphatic: Aortic atherosclerosis. No enlarged abdominal or pelvic lymph nodes. Reproductive: Uterus and bilateral adnexa are unremarkable. Changes of bilateral tubal ligation are seen. Other: No abdominal wall hernia or abnormality. No abdominopelvic ascites. Musculoskeletal: No acute or significant osseous findings. IMPRESSION: Interval increase in size of the right adrenal gland when compared with the prior exam. A large area of decreased  attenuation is noted likely related to resolving hematoma. No areas of active hemorrhage are seen at this time. Diverticulosis without diverticulitis. Fatty liver. Electronically Signed   By: Alcide Clever M.D.   On: 05/07/2023 15:16   DG Chest Portable 1 View  Result Date: 05/07/2023 CLINICAL DATA:  dib x 1 month EXAM: PORTABLE CHEST - 1 VIEW COMPARISON:  03/28/2023 FINDINGS: Lungs are clear. Heart size and mediastinal contours are within normal limits. No effusion. Cervical fixation hardware. IMPRESSION: No acute cardiopulmonary disease. Electronically Signed   By: Corlis Leak M.D.   On: 05/07/2023 14:25     Subjective: Pt reports her pain is better controlled now and she is comfortable, she is taking 1 oxycodone tablet as needed. She reports that this is much better pain control with oxycodone than with the hydrocodone.    Discharge Exam: Vitals:   05/20/23 2351 05/21/23 0451  BP: 119/80 (!) 123/55  Pulse: 82 74  Resp: 18 18  Temp: 98.3 F (36.8 C) 97.6 F (36.4 C)  SpO2: 97% 94%   Vitals:   05/20/23 2029 05/20/23 2216 05/20/23 2351 05/21/23 0451  BP: (!) 143/73 128/60 119/80 (!) 123/55  Pulse: 88 76 82 74  Resp: 20  18 18   Temp: 98.2 F (36.8 C)  98.3 F (36.8 C) 97.6 F (  36.4 C)  TempSrc: Oral   Oral  SpO2: 96%  97% 94%  Weight:      Height:       General: Pt is alert, awake, not in acute distress Cardiovascular: normal S1/S2 +, no rubs, no gallops Respiratory: CTA bilaterally, no wheezing, no rhonchi Abdominal: Soft, NT, ND, bowel sounds + Extremities: no edema, no cyanosis   The results of significant diagnostics from this hospitalization (including imaging, microbiology, ancillary and laboratory) are listed below for reference.     Microbiology: No results found for this or any previous visit (from the past 240 hour(s)).   Labs: BNP (last 3 results) No results for input(s): "BNP" in the last 8760 hours. Basic Metabolic Panel: Recent Labs  Lab 05/20/23 1652  05/21/23 0746  NA 137 137  K 3.6 3.4*  CL 99 103  CO2 27 27  GLUCOSE 91 94  BUN 11 11  CREATININE 0.87 0.78  CALCIUM 9.2 8.5*  MG  --  2.1   Liver Function Tests: Recent Labs  Lab 05/20/23 1652 05/21/23 0746  AST 19 16  ALT 41 34  ALKPHOS 78 67  BILITOT 0.8 0.6  PROT 7.9 6.5  ALBUMIN 3.9 3.3*   Recent Labs  Lab 05/20/23 1652  LIPASE 27   No results for input(s): "AMMONIA" in the last 168 hours. CBC: Recent Labs  Lab 05/20/23 1652 05/20/23 2337 05/21/23 0746  WBC 8.8 9.5 7.8  NEUTROABS 5.9  --   --   HGB 12.5 11.7* 11.1*  HCT 38.8 36.7 34.5*  MCV 86.2 88.4 88.5  PLT 456* 354 369   Cardiac Enzymes: No results for input(s): "CKTOTAL", "CKMB", "CKMBINDEX", "TROPONINI" in the last 168 hours. BNP: Invalid input(s): "POCBNP" CBG: No results for input(s): "GLUCAP" in the last 168 hours. D-Dimer No results for input(s): "DDIMER" in the last 72 hours. Hgb A1c No results for input(s): "HGBA1C" in the last 72 hours. Lipid Profile No results for input(s): "CHOL", "HDL", "LDLCALC", "TRIG", "CHOLHDL", "LDLDIRECT" in the last 72 hours. Thyroid function studies No results for input(s): "TSH", "T4TOTAL", "T3FREE", "THYROIDAB" in the last 72 hours.  Invalid input(s): "FREET3" Anemia work up No results for input(s): "VITAMINB12", "FOLATE", "FERRITIN", "TIBC", "IRON", "RETICCTPCT" in the last 72 hours. Urinalysis    Component Value Date/Time   COLORURINE YELLOW 05/20/2023 1618   APPEARANCEUR CLEAR 05/20/2023 1618   APPEARANCEUR Clear 04/06/2023 1106   LABSPEC 1.011 05/20/2023 1618   PHURINE 7.0 05/20/2023 1618   GLUCOSEU NEGATIVE 05/20/2023 1618   HGBUR NEGATIVE 05/20/2023 1618   BILIRUBINUR NEGATIVE 05/20/2023 1618   BILIRUBINUR Negative 04/06/2023 1106   KETONESUR NEGATIVE 05/20/2023 1618   PROTEINUR NEGATIVE 05/20/2023 1618   NITRITE NEGATIVE 05/20/2023 1618   LEUKOCYTESUR NEGATIVE 05/20/2023 1618   Sepsis Labs Recent Labs  Lab 05/20/23 1652  05/20/23 2337 05/21/23 0746  WBC 8.8 9.5 7.8   Microbiology No results found for this or any previous visit (from the past 240 hour(s)).  Time coordinating discharge: 45 mins  SIGNED:  Standley Dakins, MD  [facility] 05/21/2023, 11:49 AM How to contact the [facility] Attending or Consulting provider 7A - 7P or covering provider during after hours 7P -7A, for this patient?  Check the care team in [facility] and look for a) attending/consulting [facility] provider listed and b) the [facility] team listed Log into [email or web address] and use [facility]'s universal password to access. If you do not have the password, please contact the hospital operator. Locate the [facility] provider you are looking  for under [facility] and page to a number that you can be directly reached. If you still have difficulty reaching the provider, please page the [facility] (Director on Call) for the Hospitalists listed on amion for assistance.

## 2023-05-21 NOTE — Assessment & Plan Note (Signed)
--  As seen on CT - Associated hemoptysis - Hemoglobin stable at 12.5 - Consult Dr. Kirtland Bouchard, inbox message sent to notify of consult - Continue to monitor

## 2023-05-21 NOTE — Progress Notes (Signed)
   05/21/23 1215  TOC Brief Assessment  Insurance and Status Reviewed  Patient has primary care physician Yes  Home environment has been reviewed with spouse and children  Prior level of function: independent  Prior/Current Home Services No current home services  Social Determinants of Health Reivew SDOH reviewed no interventions necessary  Readmission risk has been reviewed Yes  Transition of care needs no transition of care needs at this time

## 2023-05-21 NOTE — Discharge Instructions (Signed)
IMPORTANT INFORMATION: PAY CLOSE ATTENTION  ° °PHYSICIAN DISCHARGE INSTRUCTIONS ° °Follow with Primary care provider  Gottschalk, Ashly M, DO  and other consultants as instructed by your Hospitalist Physician ° °SEEK MEDICAL CARE OR RETURN TO EMERGENCY ROOM IF SYMPTOMS COME BACK, WORSEN OR NEW PROBLEM DEVELOPS  ° °Please note: °You were cared for by a hospitalist during your hospital stay. Every effort will be made to forward records to your primary care provider.  You can request that your primary care provider send for your hospital records if they have not received them.  Once you are discharged, your primary care physician will handle any further medical issues. Please note that NO REFILLS for any discharge medications will be authorized once you are discharged, as it is imperative that you return to your primary care physician (or establish a relationship with a primary care physician if you do not have one) for your post hospital discharge needs so that they can reassess your need for medications and monitor your lab values. ° °Please get a complete blood count and chemistry panel checked by your Primary MD at your next visit, and again as instructed by your Primary MD. ° °Get Medicines reviewed and adjusted: °Please take all your medications with you for your next visit with your Primary MD ° °Laboratory/radiological data: °Please request your Primary MD to go over all hospital tests and procedure/radiological results at the follow up, please ask your primary care provider to get all Hospital records sent to his/her office. ° °In some cases, they will be blood work, cultures and biopsy results pending at the time of your discharge. Please request that your primary care provider follow up on these results. ° °If you are diabetic, please bring your blood sugar readings with you to your follow up appointment with primary care.   ° °Please call and make your follow up appointments as soon as possible.   ° °Also  Note the following: °If you experience worsening of your admission symptoms, develop shortness of breath, life threatening emergency, suicidal or homicidal thoughts you must seek medical attention immediately by calling 911 or calling your MD immediately  if symptoms less severe. ° °You must read complete instructions/literature along with all the possible adverse reactions/side effects for all the Medicines you take and that have been prescribed to you. Take any new Medicines after you have completely understood and accpet all the possible adverse reactions/side effects.  ° °Do not drive when taking Pain medications or sleeping medications (Benzodiazepines) ° °Do not take more than prescribed Pain, Sleep and Anxiety Medications. It is not advisable to combine anxiety,sleep and pain medications without talking with your primary care practitioner ° °Special Instructions: If you have smoked or chewed Tobacco  in the last 2 yrs please stop smoking, stop any regular Alcohol  and or any Recreational drug use. ° °Wear Seat belts while driving.  Do not drive if taking any narcotic, mind altering or controlled substances or recreational drugs or alcohol.  ° ° ° ° ° °

## 2023-05-21 NOTE — Telephone Encounter (Signed)
I mean 7/30, thanks

## 2023-05-21 NOTE — Assessment & Plan Note (Signed)
-   Intractable right flank pain - Patient reports pain has been going on since May/June of this year - NSAIDs in p.o. opiates have not helped the pain at home -Imaging shows adrenal hemorrhage, recurrent - Previously followed with Dr. Kirtland Bouchard - Consult Dr. Kirtland Bouchard today for adrenal hemorrhage, and new mass - Continue pain control pain scale - Continue to monitor

## 2023-05-21 NOTE — Progress Notes (Signed)
PT AVS reviewed and pt verbalized of all AVS instructions and follow up procedures. Pt going home with spouse as transportation. Pt IV removed with no c/o. Pt has all  her belongings in her possession.

## 2023-05-21 NOTE — Plan of Care (Signed)
  Problem: Education: Goal: Knowledge of General Education information will improve Description: Including pain rating scale, medication(s)/side effects and non-pharmacologic comfort measures Outcome: Progressing   Problem: Activity: Goal: Risk for activity intolerance will decrease Outcome: Progressing   

## 2023-05-21 NOTE — Progress Notes (Signed)
Triage see below   xxxxxxx Re Miranda Perkins 1961-05-30 =inpatient at [facility]   Call from Dr Theodosia Quay at [facility]   Patient admitted for abd pain and found to have adrenal meet and Left Hilar Mass He says no reason for admission other than pain Some mild hemptysis that resolved He says no sepsis or pneumonia  Vsiualized CT   LEft Hilar mass Adrenal Mass   Plan - opd PET scan  - outpatient referral to Dr Tonia Brooms and Zettie Cooley    Dr. Kalman Shan, M.D., F.C.C.P,  Pulmonary and Critical Care Medicine Staff Physician, [facility] Director - Interstitial Lung Disease  Program  P[facility] at [facility] [place], [place], [address]   Pager: [phone], If no answer  -> Check AMION or Try [phone] Telephone (clinical office): [phone] Telephone (research): [phone]  10:47 AM 05/21/2023

## 2023-05-21 NOTE — Assessment & Plan Note (Signed)
Patient reports long-term doxycycline use since 2016 for this problem - No longer using doxycycline at this time - May need referral to specialist at discharge

## 2023-05-21 NOTE — H&P (Signed)
History and Physical    Patient: Miranda Perkins ZOX:[id number] DOB: 1961-08-04 DOA: 05/20/2023 DOS: the patient was seen and examined on 05/21/2023 PCP: [name], DO  Patient coming from: Home  Chief Complaint:  Chief Complaint  Patient presents with   Flank Pain   HPI: Miranda Perkins is a 62 y.o. female with medical history significant of adrenal hemorrhage, CVA, thyroid nodules, rosacea, and more presents to the ED with a chief complaint of flank pain.  Patient reports that this all started on 25 May.  She was diagnosed with an adrenal bleed at that time She reports it all started 5/25. She was diagnosed with an adrenal hemorrhage at that time. Patient reports that she was discharged home, and her pain was uncontrolled. She was taking large amounts of [place] pain. NSAID at that time. She followed up with heme and was prescribed percocet. They did follow up imaging that showed that her hemorrhage was resolving. They also did hypercoagulability testing, but it does not seem like she followed up with them after that. In June she went to [facility] because of worsening pain. She had reported hemoptysis at that time.She reports that she has followed with her PCP for the same thing since then, but her pain has never been controlled. She describes her pain was right sided pain that radiates to back and to front. The pain is constant. It is achy. It is worse with movement and better with IV opiate med given in the ED. Morphine and Oxycodone do not touch the pain her report. She describes it as severe. She has had associated nausea but no vomiting. Her last meal was day before yesterday. Her last BM was 3 days ago. It is normal for her to have a BM every day. She reports that she has not had much PO intake because of the nausea and the pain. Patient reports that her hemoptysis is intermittent. It is light red blood seen on tissue per her report. She denies symptoms of anemia. She has no  hematuria or dysuria. Patient has no other complaints at this time.  Past Medical History:  Diagnosis Date   Adrenal hemorrhage (HCC)    Arthritis    Edema of both lower extremities    Endometrial polyp    Hirsutism    History of 2019 novel coronavirus disease (COVID-19) 12/21/2020   positive home result documented in pcp note in epic 12-23-2020,  residual doe, pulmonology consult w/ dr wert note in epic 03-20-2021   History of basal cell carcinoma (BCC) excision 2016   forehead, per pt no recurrence   History of CVA (cerebrovascular accident) without residual deficits 1993   pt stated , while on Chloramphenicol, no residuaL; [medication given to treat tick bite] ; patient states  "i stroked out right next to my doctor , he said i passed out and that was my only symptom" ;  no resiudal   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 branch,  work-up results in epic 03/ 2022 (normal nuclear stress test, normal echo, no arrhythmia's per event monitor);   (05-28-2021 per pt no symptoms since 03/ 2022)   Multiple thyroid nodules    endocrinologist--- dr g. nida,  hx benign bx, clinically euthyroid   Osteopenia 12/2017   T score -1.2 FRAX 4.9% /  0.3%   PMB (postmenopausal bleeding)    Post-COVID chronic dyspnea    Rosacea    Thickened endometrium    Past Surgical History:  Procedure Laterality Date   ANTERIOR AND POSTERIOR REPAIR N/A 08/21/2020   Procedure: ANTERIOR (CYSTOCELE) AND POSTERIOR REPAIR (RECTOCELE);  Surgeon: Genia Del, MD;  Location[facility];  Service: Gynecology;  Laterality: N/A;  requesting 9:00am OR time  requests one hour   ANTERIOR CERVICAL DECOMP/DISCECTOMY FUSION N/A 09/27/2017   Procedure: ANTERIOR CERVICAL DECOMPRESSION/DISCECTOMY FUSION CERVICAL FOUR-FIVE ,CERVICAL FIVE-SIX,CERVICAL  SIX-SEVEN;  Surgeon: Donalee Citrin, MD;  Location: [facility] OR;  Service: Neurosurgery;  Laterality: N/A;   CARPAL TUNNEL RELEASE Left 09/27/2017   Procedure: CARPAL TUNNEL RELEASE;  Surgeon: Donalee Citrin, MD;  Location: [facility] OR;  Service: Neurosurgery;  Laterality: Left;   DILATATION & CURETTAGE/HYSTEROSCOPY WITH MYOSURE N/A 06/03/2021   Procedure: DILATATION & CURETTAGE/HYSTEROSCOPY WITH MYOSURE;  Surgeon: Genia Del, MD;  Location: [facility];  Service: Gynecology;  Laterality: N/A;   FOOT SURGERY     bone removed from pinky toe   INGUINAL HERNIA REPAIR Right 1995   KNEE ARTHROSCOPY W/ MENISCAL REPAIR Left 06/21/2017   dr Netta Corrigan   LEG SURGERY  1972   MOHS SURGERY  2016   BCC of forehead   MOUTH SURGERY  2015   removal and cauterization pallete of cancerous lesion   TONSILLECTOMY  1979   TONSILLECTOMY  1977   TOTAL KNEE ARTHROPLASTY Left 01/26/2018   Procedure: LEFT TOTAL KNEE ARTHROPLASTY;  Surgeon: Ranee Gosselin, MD;  Location: [facility];  Service: Orthopedics;  Laterality: Left;   TUBAL LIGATION     Social History:  reports that she quit smoking about 13 years ago. Her smoking use included cigarettes. She started smoking about 48 years ago. She has a 17.5 pack-year smoking history. She has never used smokeless tobacco. She reports that she does not currently use alcohol. She reports that she does not use drugs.  Allergies  Allergen Reactions   Influenza Vaccines Anaphylaxis and Other (See Comments)    Per patient   Penicillins Anaphylaxis and Other (See Comments)    PATIENT HAS HAD A PCN REACTION WITH IMMEDIATE RASH, FACIAL/TONGUE/THROAT SWELLING, SOB, OR LIGHTHEADEDNESS WITH HYPOTENSION:  #  #  #  YES  #  #  #   Has patient had a PCN reaction causing severe rash involving mucus membranes or skin necrosis: No PATIENT HAS HAD A PCN REACTION THAT REQUIRED HOSPITALIZATION:  #  #  #  YES  #  #  #  Has patient had a PCN reaction occurring within the last 10 years: No     Articaine Other (See Comments)    Caused infection   Cortisone Other (See Comments)    Turned red and ran a low grade fever for 3 days   Other Rash    bandaids- skin turns red and a rash     Family History  Problem Relation Age of Onset   Diabetes Mother    Heart Problems Mother    Rheum arthritis Mother    COPD Mother    Polycystic kidney disease Mother    Carpal tunnel syndrome Mother    Cancer Father        Liver Cancer   Liver cancer Father    Carpal tunnel syndrome Sister    Thyroid disease Sister    Rheum arthritis Sister    Lung disease Sister    Diabetes Sister    Carpal  tunnel syndrome Sister    Clotting disorder Sister    Rheum arthritis Sister    Thyroid disease Sister    COPD Sister    Heart Problems Sister    Gout Sister    Cancer Brother    Rheum arthritis Maternal Aunt    Fibromyalgia Maternal Aunt    Polycystic kidney disease Maternal Aunt    Heart Problems Maternal Aunt    Anemia Maternal Aunt    Adrenal disorder Maternal Aunt    Carpal tunnel syndrome Maternal Aunt    Diabetes Maternal Aunt    Thyroid disease Maternal Aunt    Rheum arthritis Maternal Aunt    Diabetes Maternal Aunt    Diabetes Maternal Aunt    Rheum arthritis Maternal Aunt    Heart Problems Maternal Aunt    Rheum arthritis Maternal Uncle    Heart Problems Maternal Uncle    Heart Problems Maternal Grandmother    Heart Problems Maternal Grandfather    Cancer Maternal Grandfather        Lung Cancer   Heart Problems Paternal Grandmother    Cancer Paternal Grandfather        Brain & Skin cancer   Polycystic ovary syndrome Daughter     Prior to Admission medications   Medication Sig Start Date End Date Taking? Authorizing Provider  albuterol (VENTOLIN HFA) 108 (90 Base) MCG/ACT inhaler Inhale 2 puffs into the lungs every 6 (six) hours as needed. 04/15/23   Daphine Deutscher Mary-Margaret, FNP  furosemide (LASIX) 40 MG tablet Take 1.5 tablets (60 mg total) by mouth daily. 08/24/22   Antoine Poche, MD  HYDROcodone-acetaminophen (NORCO) 7.5-325 MG tablet Take 1 tablet by mouth every 6 (six) hours as needed for moderate pain.    [provider]  ibuprofen (ADVIL) 600 MG tablet Take 1 tablet (600 mg total) by mouth every 6 (six) hours as needed. 05/07/23   Sloan Leiter, DO  prochlorperazine (COMPAZINE) 10 MG tablet Take 1 tablet (10 mg total) by mouth every 6 (six) hours as needed for nausea or vomiting. 05/18/23   [name], MD  progesterone (PROMETRIUM) 200 MG capsule Nightly 10 days every 3 months 05/19/23   Lazaro Arms, MD  terconazole (TERAZOL 7) 0.4 % vaginal cream Place 1 applicator vaginally at bedtime. Patient not taking: Reported on 05/19/2023 04/27/23   Lazaro Arms, MD    Physical Exam: Vitals:   05/20/23 2029 05/20/23 2216 05/20/23 2351 05/21/23 0451  BP: (!) 143/73 128/60 119/80 (!) 123/55  Pulse: 88 76 82 74  Resp: 20  18 18   Temp: 98.2 F (36.8 C)  98.3 F (36.8 C) 97.6 F (36.4 C)  TempSrc: Oral   Oral  SpO2: 96%  97% 94%  Weight:      Height:       1.  General: Patient lying supine in bed,  no acute distress   2. Psychiatric: Alert and oriented x 3, mood and behavior normal for situation, pleasant and cooperative with exam   3. Neurologic: Speech and language are normal, face is symmetric, moves all 4 extremities voluntarily, at baseline without acute deficits on limited exam   4. HEENMT:  Head is atraumatic, normocephalic, pupils reactive to light, neck is supple, trachea is midline, mucous membranes are moist   5. Respiratory : Lungs are clear to auscultation bilaterally without wheezing, rhonchi, rales, no cyanosis, no increase in work of breathing or accessory muscle use   6. Cardiovascular : Heart rate normal, rhythm  is regular, no murmurs, rubs or gallops, no peripheral edema, peripheral pulses palpated   7. Gastrointestinal:  Abdomen is taut, nondistended, tender to palpation in right upper Q and right flank,   bowel sounds active, no masses or organomegaly palpated   8. Skin:  Skin is warm, dry and intact without rashes, acute lesions, or ulcers on limited exam   9.Musculoskeletal:  No acute deformities or trauma, no asymmetry in tone, no peripheral edema, peripheral pulses palpated, no tenderness to palpation in the extremities  Data Reviewed: Labs, imaging, and previous visits reviewed by myself.   Assessment and Plan: * Intractable pain - Intractable right flank pain - Patient reports pain has been going on since May/June of this year - NSAIDs in p.o. opiates have not helped the pain at home -Imaging shows adrenal hemorrhage, recurrent - Previously followed with Dr. Kirtland Bouchard - Consult Dr. Kirtland Bouchard today for adrenal hemorrhage, and new mass - Continue pain control pain scale - Continue to monitor  Hilar mass --As seen on CT - Associated hemoptysis - Hemoglobin stable at 12.5 - Consult Dr. Kirtland Bouchard, inbox message sent to notify of consult - Continue to monitor  Adrenal hemorrhage (HCC) - As seen on CT with underlying right adrenal mass complicated by hemorrhage - Hemoglobin stable at 12.5, but previously had been 13.8 - Trend hemoglobin q.8 hours - when last seen by Dr. Kirtland Bouchard hypercoagulable testing was done and follow up imaging that indicated resolving hemorrhage -Dr. Kirtland Bouchard notified of consult -She may need gen surg consult as wel   Rosacea Patient reports long-term doxycycline use since 2016 for this problem - No longer using doxycycline at this time - May need referral to specialist at discharge      Advance Care Planning:   Code Status: Full Code   Consults: Heme/Onc  Family Communication: No family at bedside  Severity of Illness: The appropriate patient status for this patient is OBSERVATION. Observation status is judged to be reasonable and necessary in order to provide the required intensity of service to ensure the patient's safety. The patient's presenting symptoms, physical exam  findings, and initial radiographic and laboratory data in the context of their medical condition is felt to place them at decreased risk for further clinical deterioration. Furthermore, it is anticipated that the patient will be medically stable for discharge from the hospital within 2 midnights of admission.   Author: Lilyan Gilford, DO 05/21/2023 6:12 AM  For on call review [email or web address].

## 2023-05-21 NOTE — Progress Notes (Signed)
05/21/2023 11:17 AM  I spoke with Dr. Lovell Sheehan and spoke with PCCM Dr. Marchelle Gearing and spoke with Dr. Ellin Saba.  Pt is being arranged for outpatient follow up for PET scan and referral to Dr. Tonia Brooms and Delton Coombes.  Her pain is now controlled and ok to discharge home with pain management and outpatient follow up.  Dr. Kirtland Bouchard says she should follow up with him after her bronchoscopy and PET scan.  Pt advised to return if pain becomes unmanageable at home or starts having severe hemoptysis.    Maryln Manuel, MD

## 2023-05-21 NOTE — Assessment & Plan Note (Signed)
-   As seen on CT with underlying right adrenal mass complicated by hemorrhage - Hemoglobin stable at 12.5, but previously had been 13.8 - Trend hemoglobin q.8 hours - when last seen by Dr. Kirtland Bouchard hypercoagulable testing was done and follow up imaging that indicated resolving hemorrhage -Dr. Kirtland Bouchard notified of consult -She may need gen surg consult as wel

## 2023-05-24 ENCOUNTER — Other Ambulatory Visit: Payer: Self-pay

## 2023-05-24 ENCOUNTER — Encounter: Payer: Self-pay | Admitting: Family Medicine

## 2023-05-24 ENCOUNTER — Telehealth: Payer: Self-pay

## 2023-05-24 DIAGNOSIS — C3490 Malignant neoplasm of unspecified part of unspecified bronchus or lung: Secondary | ICD-10-CM

## 2023-05-24 NOTE — Progress Notes (Signed)
Orders placed for PET scan and MRI brain per Dr. Delton Coombes

## 2023-05-24 NOTE — Transitions of Care (Post Inpatient/ED Visit) (Signed)
   05/24/2023  Name: Miranda Perkins MRN: [id number] DOB: 1961/10/31  Today's TOC FU Call Status: Today's TOC FU Call Status:: Successful TOC FU Call Competed TOC FU Call Complete Date: 05/24/23  Transition Care Management Follow-up Telephone Call Date of Discharge: 05/21/23 [facility] Type of Discharge: Inpatient Admission Primary Inpatient Discharge Diagnosis:: lung mass How have you been since you were released from the hospital?: Better Any questions or concerns?: No  Items Reviewed: Did you receive and understand the discharge instructions provided?: Yes Medications obtained,verified, and reconciled?: Yes (Medications Reviewed) Any new allergies since your discharge?: No Dietary orders reviewed?: Yes Do you have support at home?: Yes People in Home: spouse, grandchild(ren)  Medications Reviewed Today: Medications Reviewed Today     Reviewed by Karena Addison, LPN (Licensed Practical Nurse) on 05/24/23 at 1537  Med List Status: <None>   Medication Order Taking? Sig Documenting Provider Last Dose Status Informant  acetaminophen (TYLENOL) 325 MG tablet [id number]  Take 2 tablets (650 mg total) by mouth in the morning, at noon, and at bedtime. Johnson, Clanford L, MD  Active   albuterol (VENTOLIN HFA) 108 (90 Base) MCG/ACT inhaler [id number] No Inhale 2 puffs into the lungs every 6 (six) hours as needed. Daphine Deutscher Mary-Margaret, FNP Past Month Active Self, Pharmacy Records  furosemide (LASIX) 40 MG tablet [id number] No Take 1.5 tablets (60 mg total) by mouth daily. Antoine Poche, MD 05/20/2023 Active Self, Pharmacy Records  oxyCODONE (OXY IR/ROXICODONE) 5 MG immediate release tablet [id number]  Take 1-2 tablets (5-10 mg total) by mouth every 4 (four) hours as needed for moderate pain, severe pain or breakthrough pain. Johnson, Clanford L, MD  Active   prochlorperazine (COMPAZINE) 10 MG tablet [id number] No Take 1 tablet (10 mg total) by mouth every 6 (six)  hours as needed for nausea or vomiting. [name], MD Past Week Active Self, Pharmacy Records  progesterone [facility]) 200 MG capsule [id number]  Nightly 10 days every 3 months Eure, Amaryllis Dyke, MD  Active Self, Pharmacy Records           Med Note [place], [place]   Fri May 21, 2023 10:55 AM) Pt has not started yet.  Med List Note Benjamine Mola 05/07/23 1326): [employer] patient            Home Care and Equipment/Supplies: Were Home Health Services Ordered?: NA Any new equipment or medical supplies ordered?: NA  Functional Questionnaire: Do you need assistance with bathing/showering or dressing?: No Do you need assistance with meal preparation?: Yes Do you need assistance with eating?: No Do you have difficulty maintaining continence: No Do you need assistance with getting out of bed/getting out of a chair/moving?: No Do you have difficulty managing or taking your medications?: No  Follow up appointments reviewed: PCP Follow-up appointment confirmed?: NA MD Provider Line Number:[phone] Given: No Specialist Hospital Follow-up appointment confirmed?: Yes Date of Specialist follow-up appointment?: 05/26/23 Follow-Up Specialty Provider:: Ellin Saba Do you need transportation to your follow-up appointment?: No Do you understand care options if your condition(s) worsen?: Yes-patient verbalized understanding    SIGNATURE Karena Addison, LPN [facility] Nurse Health Advisor Direct Dial [phone]

## 2023-05-24 NOTE — Telephone Encounter (Signed)
Glad to see her for this.  Please schedule her a hosp follow up with me so we can coordinate some pain meds if needed

## 2023-05-25 NOTE — Progress Notes (Signed)
[facility] [address] [place] [address]   Clinic Day:  05/25/2023  Referring physician: [name], DO  Patient Care Team: [name], DO as PCP - General (Family Medicine) [name] [name], MD as PCP - Cardiology (Cardiology)   ASSESSMENT & PLAN:   Assessment: ***  Plan: ***  No orders of the defined types were placed in this encounter.     Alben Deeds Teague,acting as a Neurosurgeon for [name], MD.,have documented all relevant documentation on the behalf of [name], MD,as directed by  [name], MD while in the presence of [name], MD.   ***  [place] R Teague   7/23/20246:01 PM  CHIEF COMPLAINT/PURPOSE OF CONSULT:   Diagnosis: ***  Current Therapy:  ***  HISTORY OF PRESENT ILLNESS:   Macenzie is a 62 y.o. female presenting to clinic today for evaluation of hilar mass at the request of Nadine Counts, Ashly M, DO.  She was admitted to the ED on 05/20/23 for right flank pain. She was found by CT to have a left hilar mass that is suspicious for malignancy. She was having very mild hemoptysis. She was given pain management with oxycodone. She was discharged on 7/19.   While in the ED she underwent a CT A/P that found : a solid  left hilar mass measuring up to 5.3 cm, suspicious for lung malignancy; irregular focus of airspace disease in the left lower lobe measuring 16 x 8 mm, indeterminate in appearance for focus of infection, inflammation, or possible nodule; additional 7 mm right upper lobe ground-glass nodule; and further enlargement of the right adrenal gland since most recent prior, suspect that there is underlying right adrenal mass that is complicated by hemorrhage.  Today, she states that she is doing well overall. Her appetite level is at ***%. Her energy level is at ***%.  ***She was found to have abnormal CBC from *** ***She denies recent chest pain on exertion, shortness of  breath on minimal exertion, pre-syncopal episodes, or palpitations. ***She had not noticed any recent bleeding such as epistaxis, hematuria or hematochezia ***The patient denies over the counter NSAID ingestion. She is not *** on antiplatelets agents. Her last colonoscopy was *** ***She had no prior history or diagnosis of cancer. She denies any family history of cancer.  *** Her age appropriate screening programs are up-to-date. ***She denies any pica and eats a variety of diet. ***She never donated blood or received blood transfusion. ***The patient was prescribed oral iron supplements and she takes ***  PAST MEDICAL HISTORY:   Past Medical History: Past Medical History:  Diagnosis Date   Adrenal hemorrhage (HCC)    Arthritis    Edema of both lower extremities    Endometrial polyp    Hirsutism    History of 2019 novel coronavirus disease (COVID-19) 12/21/2020   positive home result documented in pcp note in epic 12-23-2020,  residual doe, pulmonology consult w/ dr wert note in epic 03-20-2021   History of basal cell carcinoma (BCC) excision 2016   forehead, per pt no recurrence   History of CVA (cerebrovascular accident) without residual deficits 1993   pt stated , while on Chloramphenicol, no residuaL; [medication given to treat tick bite] ; patient states  "i stroked out right next to my doctor , he said i passed out and that was my only symptom" ;  no resiudal   History of mouth cancer 2015   per pt surgically removal and  cauterized palette , 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 branch,  work-up results in epic 03/ 2022 (normal nuclear stress test, normal echo, no arrhythmia's per event monitor);   (05-28-2021 per pt no symptoms since 03/ 2022)   Multiple thyroid nodules    endocrinologist--- dr g. nida,  hx benign bx, clinically euthyroid   Osteopenia 12/2017   T score -1.2 FRAX 4.9% / 0.3%   PMB  (postmenopausal bleeding)    Post-COVID chronic dyspnea    Rosacea    Thickened endometrium     Surgical History: Past Surgical History:  Procedure Laterality Date   ANTERIOR AND POSTERIOR REPAIR N/A 08/21/2020   Procedure: ANTERIOR (CYSTOCELE) AND POSTERIOR REPAIR (RECTOCELE);  Surgeon: Genia Del, MD;  Location: [facility];  Service: Gynecology;  Laterality: N/A;  requesting 9:00am OR time  requests one hour   ANTERIOR CERVICAL DECOMP/DISCECTOMY FUSION N/A 09/27/2017   Procedure: ANTERIOR CERVICAL DECOMPRESSION/DISCECTOMY FUSION CERVICAL FOUR-FIVE ,CERVICAL FIVE-SIX,CERVICAL SIX-SEVEN;  Surgeon: Donalee Citrin, MD;  Location: [facility] OR;  Service: Neurosurgery;  Laterality: N/A;   CARPAL TUNNEL RELEASE Left 09/27/2017   Procedure: CARPAL TUNNEL RELEASE;  Surgeon: Donalee Citrin, MD;  Location: [facility] OR;  Service: Neurosurgery;  Laterality: Left;   DILATATION & CURETTAGE/HYSTEROSCOPY WITH MYOSURE N/A 06/03/2021   Procedure: DILATATION & CURETTAGE/HYSTEROSCOPY WITH MYOSURE;  Surgeon: Genia Del, MD;  Location: [facility];  Service: Gynecology;  Laterality: N/A;   FOOT SURGERY     bone removed from pinky toe   INGUINAL HERNIA REPAIR Right 1995   KNEE ARTHROSCOPY W/ MENISCAL REPAIR Left 06/21/2017   dr Netta Corrigan   LEG SURGERY  1972   MOHS SURGERY  2016   BCC of forehead   MOUTH SURGERY  2015   removal and cauterization pallete of cancerous lesion   TONSILLECTOMY  1979   TONSILLECTOMY  1977   TOTAL KNEE ARTHROPLASTY Left 01/26/2018   Procedure: LEFT TOTAL KNEE ARTHROPLASTY;  Surgeon: Ranee Gosselin, MD;  Location: [facility];  Service: Orthopedics;  Laterality: Left;   TUBAL LIGATION      Social History: Social History   Socioeconomic History   Marital status: Married    Spouse name: Not on file   Number of children: 6   Years of education: Not on file   Highest education level: Not on file  Occupational History   Occupation: retired    Comment:  still does Producer, television/film/video for [employer]   Tobacco Use   Smoking status: Former    Current packs/day: 0.00    Average packs/day: 0.5 packs/day for 35.0 years (17.5 ttl pk-yrs)    Types: Cigarettes    Start date: 11/26/1974    Quit date: 11/26/2009    Years since quitting: 13.5   Smokeless tobacco: Never  Vaping Use   Vaping status: Never Used  Substance and Sexual Activity   Alcohol use: Not Currently   Drug use: No   Sexual activity: Not Currently    Partners: Male    Birth control/protection: Post-menopausal    Comment: 1st intercourse 73 yo-5 partners, married- 25 yrs  Other Topics Concern   Not on file  Social History Narrative   Lives with husband. Raising 2 grandchildren of deceased daughter.       Lives in one story ranch house.    Social Determinants of Health   Financial Resource Strain: Low Risk  (04/27/2023)   Overall Financial  Resource Strain (CARDIA)    Difficulty of Paying Living Expenses: Not hard at all  Food Insecurity: No Food Insecurity (05/20/2023)   Hunger Vital Sign    Worried About Running Out of Food in the Last Year: Never true    Ran Out of Food in the Last Year: Never true  Transportation Needs: No Transportation Needs (05/20/2023)   PRAPARE - Administrator, Civil Service (Medical): No    Lack of Transportation (Non-Medical): No  Physical Activity: Sufficiently Active (04/27/2023)   Exercise Vital Sign    Days of Exercise per Week: 2 days    Minutes of Exercise per Session: 90 min  Stress: No Stress Concern Present (04/27/2023)   [employer] of Occupational Health - Occupational Stress Questionnaire    Feeling of Stress : Only a little  Social Connections: Moderately Isolated (04/27/2023)   Social Connection and Isolation Panel [NHANES]    Frequency of Communication with Friends and Family: More than three times a week    Frequency of Social Gatherings with Friends and Family: Never    Attends Religious Services: Never    Loss adjuster, chartered or Organizations: No    Attends Banker Meetings: Never    Marital Status: Married  Catering manager Violence: Not At Risk (05/20/2023)   Humiliation, Afraid, Rape, and Kick questionnaire    Fear of Current or Ex-Partner: No    Emotionally Abused: No    Physically Abused: No    Sexually Abused: No    Family History: Family History  Problem Relation Age of Onset   Diabetes Mother    Heart Problems Mother    Rheum arthritis Mother    COPD Mother    Polycystic kidney disease Mother    Carpal tunnel syndrome Mother    Cancer Father        Liver Cancer   Liver cancer Father    Carpal tunnel syndrome Sister    Thyroid disease Sister    Rheum arthritis Sister    Lung disease Sister    Diabetes Sister    Carpal tunnel syndrome Sister    Clotting disorder Sister    Rheum arthritis Sister    Thyroid disease Sister    COPD Sister    Heart Problems Sister    Gout Sister    Cancer Brother    Rheum arthritis Maternal Aunt    Fibromyalgia Maternal Aunt    Polycystic kidney disease Maternal Aunt    Heart Problems Maternal Aunt    Anemia Maternal Aunt    Adrenal disorder Maternal Aunt    Carpal tunnel syndrome Maternal Aunt    Diabetes Maternal Aunt    Thyroid disease Maternal Aunt    Rheum arthritis Maternal Aunt    Diabetes Maternal Aunt    Diabetes Maternal Aunt    Rheum arthritis Maternal Aunt    Heart Problems Maternal Aunt    Rheum arthritis Maternal Uncle    Heart Problems Maternal Uncle    Heart Problems Maternal Grandmother    Heart Problems Maternal Grandfather    Cancer Maternal Grandfather        Lung Cancer   Heart Problems Paternal Grandmother    Cancer Paternal Grandfather        Brain & Skin cancer   Polycystic ovary syndrome Daughter     Current Medications:  Current Outpatient Medications:    acetaminophen (TYLENOL) 325 MG tablet, Take 2 tablets (650 mg total) by mouth in the morning, at  noon, and at bedtime., Disp: , Rfl:     albuterol (VENTOLIN HFA) 108 (90 Base) MCG/ACT inhaler, Inhale 2 puffs into the lungs every 6 (six) hours as needed., Disp: 18 g, Rfl: 2   furosemide (LASIX) 40 MG tablet, Take 1.5 tablets (60 mg total) by mouth daily., Disp: 135 tablet, Rfl: 3   oxyCODONE (OXY IR/ROXICODONE) 5 MG immediate release tablet, Take 1-2 tablets (5-10 mg total) by mouth every 4 (four) hours as needed for moderate pain, severe pain or breakthrough pain., Disp: 40 tablet, Rfl: 0   prochlorperazine (COMPAZINE) 10 MG tablet, Take 1 tablet (10 mg total) by mouth every 6 (six) hours as needed for nausea or vomiting., Disp: 30 tablet, Rfl: 3   progesterone (PROMETRIUM) 200 MG capsule, Nightly 10 days every 3 months, Disp: 30 capsule, Rfl: 4   Allergies: Allergies  Allergen Reactions   Influenza Vaccines Anaphylaxis and Other (See Comments)    Per patient   Penicillins Anaphylaxis and Other (See Comments)    PATIENT HAS HAD A PCN REACTION WITH IMMEDIATE RASH, FACIAL/TONGUE/THROAT SWELLING, SOB, OR LIGHTHEADEDNESS WITH HYPOTENSION:  #  #  #  YES  #  #  #   Has patient had a PCN reaction causing severe rash involving mucus membranes or skin necrosis: No PATIENT HAS HAD A PCN REACTION THAT REQUIRED HOSPITALIZATION:  #  #  #  YES  #  #  #  Has patient had a PCN reaction occurring within the last 10 years: No    Articaine Other (See Comments)    Caused infection   Cortisone Other (See Comments)    Turned red and ran a low grade fever for 3 days   Other Rash    bandaids- skin turns red and a rash     REVIEW OF SYSTEMS:   Review of Systems  Constitutional:  Negative for chills, fatigue and fever.  HENT:   Negative for lump/mass, mouth sores, nosebleeds, sore throat and trouble swallowing.   Eyes:  Negative for eye problems.  Respiratory:  Negative for cough and shortness of breath.   Cardiovascular:  Negative for chest pain, leg swelling and palpitations.  Gastrointestinal:  Negative for abdominal pain, constipation,  diarrhea, nausea and vomiting.  Genitourinary:  Negative for bladder incontinence, difficulty urinating, dysuria, frequency, hematuria and nocturia.   Musculoskeletal:  Negative for arthralgias, back pain, flank pain, myalgias and neck pain.  Skin:  Negative for itching and rash.  Neurological:  Negative for dizziness, headaches and numbness.  Hematological:  Does not bruise/bleed easily.  Psychiatric/Behavioral:  Negative for depression, sleep disturbance and suicidal ideas. The patient is not nervous/anxious.   All other systems reviewed and are negative.    VITALS:   There were no vitals taken for this visit.  Wt Readings from Last 3 Encounters:  05/20/23 237 lb 14 oz (107.9 kg)  05/18/23 238 lb 8 oz (108.2 kg)  04/27/23 249 lb (112.9 kg)    There is no height or weight on file to calculate BMI.   PHYSICAL EXAM:   Physical Exam Vitals and nursing note reviewed. Exam conducted with a chaperone present.  Constitutional:      Appearance: Normal appearance.  Cardiovascular:     Rate and Rhythm: Normal rate and regular rhythm.     Pulses: Normal pulses.     Heart sounds: Normal heart sounds.  Pulmonary:     Effort: Pulmonary effort is normal.     Breath sounds: Normal breath sounds.  Abdominal:  Palpations: Abdomen is soft. There is no hepatomegaly, splenomegaly or mass.     Tenderness: There is no abdominal tenderness.  Musculoskeletal:     Right lower leg: No edema.     Left lower leg: No edema.  Lymphadenopathy:     Cervical: No cervical adenopathy.     Right cervical: No superficial, deep or posterior cervical adenopathy.    Left cervical: No superficial, deep or posterior cervical adenopathy.     Upper Body:     Right upper body: No supraclavicular or axillary adenopathy.     Left upper body: No supraclavicular or axillary adenopathy.  Neurological:     General: No focal deficit present.     Mental Status: She is alert and oriented to person, place, and time.   Psychiatric:        Mood and Affect: Mood normal.        Behavior: Behavior normal.     LABS:      Latest Ref Rng & Units 05/21/2023    7:46 AM 05/20/2023   11:37 PM 05/20/2023    4:52 PM  CBC  WBC 4.0 - 10.5 K/uL 7.8  9.5  8.8   Hemoglobin 12.0 - 15.0 g/dL 40.9  81.1  91.4   Hematocrit 36.0 - 46.0 % 34.5  36.7  38.8   Platelets 150 - 400 K/uL 369  354  456       Latest Ref Rng & Units 05/21/2023    7:46 AM 05/20/2023    4:52 PM 05/07/2023   12:55 PM  CMP  Glucose 70 - 99 mg/dL 94  91  782   BUN 8 - 23 mg/dL 11  11  14    Creatinine 0.44 - 1.00 mg/dL 9.56  2.13  0.86   Sodium 135 - 145 mmol/L 137  137  138   Potassium 3.5 - 5.1 mmol/L 3.4  3.6  4.0   Chloride 98 - 111 mmol/L 103  99  102   CO2 22 - 32 mmol/L 27  27  24    Calcium 8.9 - 10.3 mg/dL 8.5  9.2  57.8   Total Protein 6.5 - 8.1 g/dL 6.5  7.9  7.6   Total Bilirubin 0.3 - 1.2 mg/dL 0.6  0.8  0.8   Alkaline Phos 38 - 126 U/L 67  78  72   AST 15 - 41 U/L 16  19  13    ALT 0 - 44 U/L 34  41  18      No results found for: "CEA1", "CEA" / No results found for: "CEA1", "CEA" No results found for: "PSA1" No results found for: "ION629" No results found for: "CAN125"  No results found for: "TOTALPROTELP", "ALBUMINELP", "A1GS", "A2GS", "BETS", "BETA2SER", "GAMS", "MSPIKE", "SPEI" No results found for: "TIBC", "FERRITIN", "IRONPCTSAT" No results found for: "LDH"   STUDIES:   CT ABDOMEN PELVIS W CONTRAST  Result Date: 05/20/2023 CLINICAL DATA:  Right flank and lower quadrant pain EXAM: CT ANGIOGRAPHY CHEST CT ABDOMEN AND PELVIS WITH CONTRAST TECHNIQUE: Multidetector CT imaging of the chest was performed using the standard protocol during bolus administration of intravenous contrast. Multiplanar CT image reconstructions and MIPs were obtained to evaluate the vascular anatomy. Multidetector CT imaging of the abdomen and pelvis was performed using the standard protocol during bolus administration of intravenous contrast.  RADIATION DOSE REDUCTION: This exam was performed according to the departmental dose-optimization program which includes automated exposure control, adjustment of the mA and/or kV according to patient size  and/or use of iterative reconstruction technique. CONTRAST:  OMNIPAQUE IOHEXOL 350 MG/ML SOLN COMPARISON:  CT 05/07/2023, chest x-ray 05/07/2023, CT 03/28/2023, CT 11/29/2020 further enlargement of right adrenal gland now measuring 8.9 x 6.7 cm, most recent prior measurement of 7.7 by 6.2 cm. Low-density cystic area on series 2, image 22 posteriorly could reflect organizing hematoma. Interim finding of moderate slightly loculated fluid surrounding the superior pole right kidney, series 2, image 31, coronal series 4, image 84. Slight interval increase in right perinephric stranding. FINDINGS: CTA CHEST FINDINGS Cardiovascular: Satisfactory opacification of the pulmonary arteries to the segmental level. No evidence of pulmonary embolism. Normal heart size. No pericardial effusion. Nonaneurysmal aorta. No dissection is seen. Mediastinum/Nodes: Midline trachea. No thyroid mass. Esophagus within normal limits. Lungs/Pleura: No pleural effusion or pneumothorax. Solid left hilar mass measuring approximately 5.3 by 3.9 cm with partial in case mint of left-sided pulmonary vessels. Mild narrowing of left lower lobe bronchi but no occlusion. Irregular focus of airspace disease in the left lower lobe measuring 16 by 8 mm on series 6 image 75. Right upper lobe ground-glass nodule measuring 7 mm on series 6, image 48. Musculoskeletal: No acute or suspicious osseous abnormality. Review of the MIP images confirms the above findings. CT ABDOMEN and PELVIS FINDINGS Hepatobiliary: No focal liver abnormality is seen. No gallstones, gallbladder wall thickening, or biliary dilatation. Pancreas: Unremarkable. No pancreatic ductal dilatation or surrounding inflammatory changes. Spleen: Normal in size without focal abnormality.  Adrenals/Urinary Tract: Left adrenal gland is normal. Further interval enlargement of right adrenal gland, measuring 8.7 x 6.9 cm, previously 7.7 cm. Cystic low-density area posteriorly on series 2, image 21 could reflect organizing hematoma. Interim finding of additional low-density collection measuring 5.6 x 4.6 cm on series 2, image 28 at the inferior aspect of the right adrenal gland, some surrounding low-density fluid which is also new or increased and is seen surrounding the upper pole of right kidney. Kidneys show no hydronephrosis. The bladder is unremarkable Stomach/Bowel: The stomach is nonenlarged. There is no dilated small bowel. No acute bowel wall thickening. Appendix not well seen. Vascular/Lymphatic: Mild aortic atherosclerosis. No aneurysm. No suspicious lymph nodes. Reproductive: Bilateral tubal clips. Uterus unremarkable. No adnexal mass Other: Negative for pelvic effusion or free air. Small fat containing umbilical hernia Musculoskeletal: No acute or suspicious osseous abnormality. Review of the MIP images confirms the above findings. IMPRESSION: 1. Negative for acute pulmonary embolus. 2. 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 Multi disciplinary thoracic consultation is recommended. 3. Irregular focus of airspace disease in the left lower lobe measuring 16 x 8 mm, indeterminate in appearance for focus of infection, inflammation, or possible nodule. Additional 7 mm right upper lobe ground-glass nodule. Attention on follow-up imaging. 4. Further enlargement of the right adrenal gland since most recent prior, suspect that there is underlying right adrenal mass that is complicated by hemorrhage. Some of the measured right adrenal gland/mass is felt secondary to organizing hematoma. Since the most recent prior, interim development of additional low-density fluid at the inferior aspect of the right adrenal and surrounding the upper pole of right kidney which  could be due to additional interval hemorrhage but there is no hyperdense blood at this time or evidence for extravasation. 5. Aortic atherosclerosis. Aortic Atherosclerosis (ICD10-I70.0). Electronically Signed   By: Jasmine Pang M.D.   On: 05/20/2023 19:17   CT Angio Chest PE W and/or Wo Contrast  Result Date: 05/20/2023 CLINICAL DATA:  Right flank  and lower quadrant pain EXAM: CT ANGIOGRAPHY CHEST CT ABDOMEN AND PELVIS WITH CONTRAST TECHNIQUE: Multidetector CT imaging of the chest was performed using the standard protocol during bolus administration of intravenous contrast. Multiplanar CT image reconstructions and MIPs were obtained to evaluate the vascular anatomy. Multidetector CT imaging of the abdomen and pelvis was performed using the standard protocol during bolus administration of intravenous contrast. RADIATION DOSE REDUCTION: This exam was performed according to the departmental dose-optimization program which includes automated exposure control, adjustment of the mA and/or kV according to patient size and/or use of iterative reconstruction technique. CONTRAST:  OMNIPAQUE IOHEXOL 350 MG/ML SOLN COMPARISON:  CT 05/07/2023, chest x-ray 05/07/2023, CT 03/28/2023, CT 11/29/2020 further enlargement of right adrenal gland now measuring 8.9 x 6.7 cm, most recent prior measurement of 7.7 by 6.2 cm. Low-density cystic area on series 2, image 22 posteriorly could reflect organizing hematoma. Interim finding of moderate slightly loculated fluid surrounding the superior pole right kidney, series 2, image 31, coronal series 4, image 84. Slight interval increase in right perinephric stranding. FINDINGS: CTA CHEST FINDINGS Cardiovascular: Satisfactory opacification of the pulmonary arteries to the segmental level. No evidence of pulmonary embolism. Normal heart size. No pericardial effusion. Nonaneurysmal aorta. No dissection is seen. Mediastinum/Nodes: Midline trachea. No thyroid mass. Esophagus within  normal limits. Lungs/Pleura: No pleural effusion or pneumothorax. Solid left hilar mass measuring approximately 5.3 by 3.9 cm with partial in case mint of left-sided pulmonary vessels. Mild narrowing of left lower lobe bronchi but no occlusion. Irregular focus of airspace disease in the left lower lobe measuring 16 by 8 mm on series 6 image 75. Right upper lobe ground-glass nodule measuring 7 mm on series 6, image 48. Musculoskeletal: No acute or suspicious osseous abnormality. Review of the MIP images confirms the above findings. CT ABDOMEN and PELVIS FINDINGS Hepatobiliary: No focal liver abnormality is seen. No gallstones, gallbladder wall thickening, or biliary dilatation. Pancreas: Unremarkable. No pancreatic ductal dilatation or surrounding inflammatory changes. Spleen: Normal in size without focal abnormality. Adrenals/Urinary Tract: Left adrenal gland is normal. Further interval enlargement of right adrenal gland, measuring 8.7 x 6.9 cm, previously 7.7 cm. Cystic low-density area posteriorly on series 2, image 21 could reflect organizing hematoma. Interim finding of additional low-density collection measuring 5.6 x 4.6 cm on series 2, image 28 at the inferior aspect of the right adrenal gland, some surrounding low-density fluid which is also new or increased and is seen surrounding the upper pole of right kidney. Kidneys show no hydronephrosis. The bladder is unremarkable Stomach/Bowel: The stomach is nonenlarged. There is no dilated small bowel. No acute bowel wall thickening. Appendix not well seen. Vascular/Lymphatic: Mild aortic atherosclerosis. No aneurysm. No suspicious lymph nodes. Reproductive: Bilateral tubal clips. Uterus unremarkable. No adnexal mass Other: Negative for pelvic effusion or free air. Small fat containing umbilical hernia Musculoskeletal: No acute or suspicious osseous abnormality. Review of the MIP images confirms the above findings. IMPRESSION: 1. Negative for acute pulmonary  embolus. 2. 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 Multi disciplinary thoracic consultation is recommended. 3. Irregular focus of airspace disease in the left lower lobe measuring 16 x 8 mm, indeterminate in appearance for focus of infection, inflammation, or possible nodule. Additional 7 mm right upper lobe ground-glass nodule. Attention on follow-up imaging. 4. Further enlargement of the right adrenal gland since most recent prior, suspect that there is underlying right adrenal mass that is complicated by hemorrhage. Some of the measured right adrenal  gland/mass is felt secondary to organizing hematoma. Since the most recent prior, interim development of additional low-density fluid at the inferior aspect of the right adrenal and surrounding the upper pole of right kidney which could be due to additional interval hemorrhage but there is no hyperdense blood at this time or evidence for extravasation. 5. Aortic atherosclerosis. Aortic Atherosclerosis (ICD10-I70.0). Electronically Signed   By: Jasmine Pang M.D.   On: 05/20/2023 19:17   US PELVIC COMPLETE WITH TRANSVAGINAL  Result Date: 05/19/2023 Images from the original result were not included.  ..an Financial trader of Ultrasound Medicine Technical sales engineer) accredited practice [facility] [address] [place] [address] Ordering Provider: Lazaro Arms, MD                                                                                                                                   GYNECOLOGIC SONOGRAM DONISE WOODLE is a 62 y.o. [phone] No LMP recorded. Patient is postmenopausal. She is here for a pelvic sonogram for postmenopausal bleeding. Uterus                      6.8 x 4.7 x 5.9 cm, Total uterine volume 98 cc, heterogeneous, anteverted uterus with echogenic myometrial linear striations, ? Adenomyosis Endometrium          8.2 mm, symmetrical, avascular, thicken, homogenous  endometrium Right ovary             2.6 x 1.7 x 1.9 cm, normal (limited view) Left ovary                2 x 2.4 x 2.2 cm, normal (limited view) No free fluid,multiple simple nabothian cysts Technician Comments: PELVIC US TA/TV: Heterogeneous anteverted uterus with echogenic myometrial linear striations, ? Adenomyosis,avascular thicken homogenous endometrium, EEC 8.2 mm,normal ovaries (limited view), no free fluid,no pain during ultrasound Chaperone [id number] Flora Lipps 05/19/2023 3:50 PM  Clinical Impression and recommendations: I have reviewed the sonogram results above, combined with the patient's current clinical course, below are my impressions and any appropriate recommendations for management based on the sonographic findings. Uterus normal size shape and contour, adenomyosis Endometrium 8.2 mm no polyps homogenous no blood flow Ovaries: normal size shape and morphology for post menopausal woman Lazaro Arms 05/19/2023 4:26 PM  US THYROID  Result Date: 05/12/2023 CLINICAL DATA:  Multinodular goiter follow-up EXAM: THYROID ULTRASOUND TECHNIQUE: Ultrasound examination of the thyroid gland and adjacent soft tissues was performed. COMPARISON:  07/17/2021 FINDINGS: Parenchymal Echotexture: Mildly heterogenous Isthmus: 0.2 cm Right lobe: 5.0 x 2.0 x 1.8 cm Left lobe: 3.4 x 2.6 x 1.3 cm _________________________________________________________ Estimated total number of nodules >/= 1 cm: 2 Number of spongiform nodules >/=  2 cm not described below (TR1): 0 Number of mixed cystic and solid nodules >/= 1.5 cm not described below (TR2): 0 _________________________________________________________ Nodule 1: 1.4 x 1.0  x 0.9 cm right superior thyroid nodule is not significantly changed in size since 03/26/2016 where it measured 1.3 x 0.8 x 1.1 cm, consistent with benign etiology. Nodule 2: 2.0 x 1.2 x 1.1 cm right mid thyroid nodule is not significantly changed since 03/26/2016 where it measured 1.7 x 0.9 x 1.4 cm,  consistent with benign etiology. IMPRESSION: 1. Right thyroid nodules are unchanged in size since 03/26/2016, consistent with benign etiology. 2. No new thyroid nodule. The above is in keeping with the ACR TI-RADS recommendations - J Am Coll Radiol 2017;14:587-595. Electronically Signed   By: Acquanetta Belling M.D.   On: 05/12/2023 09:46   CT ABDOMEN PELVIS W CONTRAST  Result Date: 05/07/2023 CLINICAL DATA:  Acute abdominal pain on the right, initial encounter EXAM: CT ABDOMEN AND PELVIS WITH CONTRAST TECHNIQUE: Multidetector CT imaging of the abdomen and pelvis was performed using the standard protocol following bolus administration of intravenous contrast. RADIATION DOSE REDUCTION: This exam was performed according to the departmental dose-optimization program which includes automated exposure control, adjustment of the mA and/or kV according to patient size and/or use of iterative reconstruction technique. CONTRAST:  OMNIPAQUE IOHEXOL 300 MG/ML  SOLN COMPARISON:  03/28/2023 FINDINGS: Lower chest: No acute abnormality. Hepatobiliary: Fatty infiltration of the liver is noted. Gallbladder is within normal limits. Pancreas: Pancreas is unremarkable. Spleen: Normal in size without focal abnormality. Adrenals/Urinary Tract: Left adrenal gland is within normal limits. Right adrenal gland demonstrates significant enlargement now measuring 7.6 cm in greatest dimension. This is increased from the prior exam at which time it measured 4.9 cm in greatest dimension. Areas of central decreased attenuation are noted likely related to resolving hemorrhage given the patient's clinical history. No acute increased attenuation is identified to suggest active hemorrhage at this time. Kidneys are well visualize within normal enhancement pattern. No obstructive changes are seen. The bladder is partially distended. Stomach/Bowel: Mild diverticular change of the colon is noted without evidence of diverticulitis. The appendix is not  well visualized. No inflammatory changes to suggest appendicitis are noted. Stomach and small bowel are within normal limits. Vascular/Lymphatic: Aortic atherosclerosis. No enlarged abdominal or pelvic lymph nodes. Reproductive: Uterus and bilateral adnexa are unremarkable. Changes of bilateral tubal ligation are seen. Other: No abdominal wall hernia or abnormality. No abdominopelvic ascites. Musculoskeletal: No acute or significant osseous findings. IMPRESSION: Interval increase in size of the right adrenal gland when compared with the prior exam. A large area of decreased attenuation is noted likely related to resolving hematoma. No areas of active hemorrhage are seen at this time. Diverticulosis without diverticulitis. Fatty liver. Electronically Signed   By: Alcide Clever M.D.   On: 05/07/2023 15:16   DG Chest Portable 1 View  Result Date: 05/07/2023 CLINICAL DATA:  dib x 1 month EXAM: PORTABLE CHEST - 1 VIEW COMPARISON:  03/28/2023 FINDINGS: Lungs are clear. Heart size and mediastinal contours are within normal limits. No effusion. Cervical fixation hardware. IMPRESSION: No acute cardiopulmonary disease. Electronically Signed   By: Corlis Leak M.D.   On: 05/07/2023 14:25

## 2023-05-26 ENCOUNTER — Other Ambulatory Visit: Payer: Self-pay

## 2023-05-26 ENCOUNTER — Inpatient Hospital Stay: Admitting: Hematology

## 2023-05-26 ENCOUNTER — Inpatient Hospital Stay

## 2023-05-26 VITALS — BP 129/77 | HR 76 | Temp 97.6°F | Resp 18

## 2023-05-26 DIAGNOSIS — E2749 Other adrenocortical insufficiency: Secondary | ICD-10-CM | POA: Diagnosis not present

## 2023-05-26 DIAGNOSIS — C3490 Malignant neoplasm of unspecified part of unspecified bronchus or lung: Secondary | ICD-10-CM | POA: Diagnosis not present

## 2023-05-26 DIAGNOSIS — R197 Diarrhea, unspecified: Secondary | ICD-10-CM

## 2023-05-26 DIAGNOSIS — Z808 Family history of malignant neoplasm of other organs or systems: Secondary | ICD-10-CM

## 2023-05-26 DIAGNOSIS — Z801 Family history of malignant neoplasm of trachea, bronchus and lung: Secondary | ICD-10-CM

## 2023-05-26 DIAGNOSIS — R109 Unspecified abdominal pain: Secondary | ICD-10-CM | POA: Diagnosis not present

## 2023-05-26 DIAGNOSIS — M549 Dorsalgia, unspecified: Secondary | ICD-10-CM

## 2023-05-26 DIAGNOSIS — C7971 Secondary malignant neoplasm of right adrenal gland: Secondary | ICD-10-CM | POA: Diagnosis not present

## 2023-05-26 DIAGNOSIS — Z87891 Personal history of nicotine dependence: Secondary | ICD-10-CM

## 2023-05-26 MED ORDER — LORAZEPAM 1 MG PO TABS: 1.0000 mg | ORAL_TABLET | Freq: Once | ORAL | 0 refills | Status: AC

## 2023-05-26 MED ORDER — HYDROMORPHONE HCL 1 MG/ML IJ SOLN
1.0000 mg | Freq: Once | INTRAMUSCULAR | Status: DC
  Filled 2023-05-26: qty 1

## 2023-05-26 MED ORDER — MAGNESIUM SULFATE 2 GM/50ML IV SOLN
2.0000 g | Freq: Once | INTRAVENOUS | Status: AC
  Administered 2023-05-26: 2 g via INTRAVENOUS
  Filled 2023-05-26: qty 50

## 2023-05-26 MED ORDER — ONDANSETRON HCL 8 MG PO TABS: 8.0000 mg | ORAL_TABLET | Freq: Three times a day (TID) | ORAL | 3 refills | Status: DC | PRN

## 2023-05-26 MED ORDER — HYDROMORPHONE HCL 1 MG/ML IJ SOLN
2.0000 mg | Freq: Once | INTRAMUSCULAR | Status: AC
  Administered 2023-05-26: 2 mg via INTRAVENOUS
  Filled 2023-05-26: qty 2

## 2023-05-26 MED ORDER — ONDANSETRON HCL 4 MG/2ML IJ SOLN
8.0000 mg | Freq: Once | INTRAMUSCULAR | Status: AC
  Administered 2023-05-26: 8 mg via INTRAVENOUS
  Filled 2023-05-26: qty 4

## 2023-05-26 MED ORDER — HYDROMORPHONE HCL 1 MG/ML IJ SOLN: 2.0000 mg | Freq: Once | INTRAMUSCULAR | Status: DC

## 2023-05-26 MED ORDER — HYDROMORPHONE HCL 2 MG PO TABS: 2.0000 mg | ORAL_TABLET | ORAL | 0 refills | Status: DC | PRN

## 2023-05-26 MED ORDER — POTASSIUM CHLORIDE IN NACL 20-0.9 MEQ/L-% IV SOLN
Freq: Once | INTRAVENOUS | Status: AC
  Filled 2023-05-26: qty 1000

## 2023-05-26 NOTE — Progress Notes (Signed)
Patient presents today for a follow up visit with Dr. Ellin Saba. Vital signs stable. Blood pressure elevated on arrival. Patient rates her pain a 9/10 on the left side that radiates across abdomen. Orders received to give House Fluids, 2 mg of IV Dilaudid x 1 dose, and 8 mg of Zofran IV push.   Patient states pain level at 0 after the Dilaudid. See flowsheet.

## 2023-05-26 NOTE — Patient Instructions (Addendum)
[facility]  Discharge Instructions  You were seen and examined today by Dr. Ellin Saba. Dr. Ellin Saba is a medical oncologist, meaning that he specializes in the treatment of cancer diagnoses. Dr. Ellin Saba discussed your past medical history, family history of cancers, and the events that led to you being here today.  You were referred back to Dr. Ellin Saba due to an abnormal CT scan finding that identified a mass in your lung highly concerning for cancer.  Dr. Ellin Saba has recommended a PET scan and brain MRI to accurately stage the cancer.  Dr. Ellin Saba has also recommended additional labs today for further evaluation.  You will also need a biopsy to identify the exact type of cancer.  To help with the pain related to your adrenal gland, Dr. Ellin Saba may can arrange radiation therapy.  Follow-up as scheduled.  Thank you for choosing [facility] to provide your oncology and hematology care.   To afford each patient quality time with our provider, please arrive at least 15 minutes before your scheduled appointment time. You may need to reschedule your appointment if you arrive late (10 or more minutes). Arriving late affects you and other patients whose appointments are after yours.  Also, if you miss three or more appointments without notifying the office, you may be dismissed from the clinic at the provider's discretion.    Again, thank you for choosing [facility].  Our hope is that these requests will decrease the amount of time that you wait before being seen by our physicians.   If you have a lab appointment with the Cancer Center - please note that after April 8th, all labs will be drawn in the cancer center.  You do not have to check in or register with the main entrance as you have in the past but will complete your check-in at the cancer center.             _____________________________________________________________  Should you have questions after your visit to [facility], please contact our office at [phone] and follow the prompts.  Our office hours are 8:00 a.m. to 4:30 p.m. Monday - Thursday and 8:00 a.m. to 2:30 p.m. Friday.  Please note that voicemails left after 4:00 p.m. may not be returned until the following business day.  We are closed weekends and all major holidays.  You do have access to a nurse 24-7, just call the main number to the clinic [phone] and do not press any options, hold on the line and a nurse will answer the phone.    For prescription refill requests, have your pharmacy contact our office and allow 72 hours.    Masks are no longer required in the cancer centers. If you would like for your care team to wear a mask while they are taking care of you, please let them know. You may have one support person who is at least 64 years old accompany you for your appointments.

## 2023-05-26 NOTE — Patient Instructions (Signed)

## 2023-05-26 NOTE — Progress Notes (Signed)
Patient tolerated hydration with no complaints voiced.  Peripheral IV site clean and dry with good blood return noted before and after hydration.  Flushed with NACL 10 ml after completion of hydration.  No bruising or swelling noted with port.  Band aid applied.  VSS with discharge and left ambulatory with no s/s of distress noted.

## 2023-05-27 ENCOUNTER — Encounter (HOSPITAL_COMMUNITY)
Admission: RE | Admit: 2023-05-27 | Discharge: 2023-05-27 | Disposition: A | Discharge status: Home / Self Care | Source: Ambulatory Visit | Attending: Hematology | Admitting: Hematology

## 2023-05-27 DIAGNOSIS — C3490 Malignant neoplasm of unspecified part of unspecified bronchus or lung: Secondary | ICD-10-CM | POA: Diagnosis present

## 2023-05-27 DIAGNOSIS — E2749 Other adrenocortical insufficiency: Secondary | ICD-10-CM | POA: Diagnosis not present

## 2023-05-27 LAB — C DIFFICILE QUICK SCREEN W PCR REFLEX
C Diff antigen: POSITIVE — AB
C Diff interpretation: DETECTED
C Diff toxin: POSITIVE — AB

## 2023-05-27 MED ORDER — FLUDEOXYGLUCOSE F - 18 (FDG) INJECTION
11.6300 | Freq: Once | INTRAVENOUS | Status: AC | PRN
  Administered 2023-05-27: 11.63 via INTRAVENOUS

## 2023-05-28 ENCOUNTER — Other Ambulatory Visit: Payer: Self-pay

## 2023-05-28 ENCOUNTER — Telehealth: Admitting: Family Medicine

## 2023-05-28 ENCOUNTER — Encounter: Payer: Self-pay | Admitting: Family Medicine

## 2023-05-28 ENCOUNTER — Encounter (HOSPITAL_COMMUNITY): Payer: Self-pay | Admitting: Hematology

## 2023-05-28 DIAGNOSIS — R52 Pain, unspecified: Secondary | ICD-10-CM | POA: Diagnosis not present

## 2023-05-28 DIAGNOSIS — R918 Other nonspecific abnormal finding of lung field: Secondary | ICD-10-CM | POA: Diagnosis not present

## 2023-05-28 DIAGNOSIS — A0472 Enterocolitis due to Clostridium difficile, not specified as recurrent: Secondary | ICD-10-CM | POA: Diagnosis not present

## 2023-05-28 MED ORDER — FIDAXOMICIN 200 MG PO TABS: 200.0000 mg | ORAL_TABLET | Freq: Two times a day (BID) | ORAL | 0 refills | Status: AC

## 2023-05-28 NOTE — Progress Notes (Signed)
MyChart Video visit  Subjective: [id number] follow up PCP: [name], DO NGE:[id number] A Deschamps is a 62 y.o. female. Patient provides verbal consent for consult held via video.  Due to COVID-19 pandemic this visit was conducted virtually. This visit type was conducted due to national recommendations for restrictions regarding the COVID-19 Pandemic (e.g. social distancing, sheltering in place) in an effort to limit this patient's exposure and mitigate transmission in our community. All issues noted in this document were discussed and addressed.  A physical exam was not performed with this format.   Location of patient: home Location of provider: [facility] Others present for call: none  1.  Hilar mass Patient was supposed to come in today for hospital follow-up discharge but she recently found out she was positive for C. difficile and has yet to start treatment.  For fear of spreading this in the clinic she asked for video visit.  Since I saw her last, she has been seen in the ER for abdominal pain.  On the 05/20/2023 visit they found a hilar mass as well as an adrenal mass that been hiding behind the hemorrhage.  She has since been evaluated by her hematologist/oncologist again and PET scan has been ordered and completed as of yesterday.  Results are still pending but she expects that he will be seeing her back soon for review and further management.  She also has an upcoming appoint with pulmonology discussed biopsy of this lesion.  Dr. Ellin Saba has prescribed her an advance pain regimen with Dilaudid 2 mg every 3 hours since her pain was refractory to multiple opioids and other modalities of treatment.  Patient reports that Dilaudid has been helpful to help control pain.  She has had to use med roughly every 3 hours.  She took 1 less yesterday because she was actually comfortable enough to dose off to sleep. No constipation.  No excessive sedation from med.    ROS: Per HPI  Allergies   Allergen Reactions   Influenza Vaccines Anaphylaxis and Other (See Comments)    Per patient   Penicillins Anaphylaxis and Other (See Comments)    PATIENT HAS HAD A PCN REACTION WITH IMMEDIATE RASH, FACIAL/TONGUE/THROAT SWELLING, SOB, OR LIGHTHEADEDNESS WITH HYPOTENSION:  #  #  #  YES  #  #  #   Has patient had a PCN reaction causing severe rash involving mucus membranes or skin necrosis: No PATIENT HAS HAD A PCN REACTION THAT REQUIRED HOSPITALIZATION:  #  #  #  YES  #  #  #  Has patient had a PCN reaction occurring within the last 10 years: No    Articaine Other (See Comments)    Caused infection   Cortisone Other (See Comments)    Turned red and ran a low grade fever for 3 days   Other Rash    bandaids- skin turns red and a rash    Past Medical History:  Diagnosis Date   Adrenal hemorrhage (HCC)    Arthritis    Edema of both lower extremities    Endometrial polyp    Hirsutism    History of 2019 novel coronavirus disease (COVID-19) 12/21/2020   positive home result documented in pcp note in epic 12-23-2020,  residual doe, pulmonology consult w/ dr wert note in epic 03-20-2021   History of basal cell carcinoma (BCC) excision 2016   forehead, per pt no recurrence   History of CVA (cerebrovascular accident) without residual deficits 1993   pt stated ,  while on Chloramphenicol, no residuaL; [medication given to treat tick bite] ; patient states  "i stroked out right next to my doctor , he said i passed out and that was my only symptom" ;  no resiudal   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 branch,  work-up results in epic 03/ 2022 (normal nuclear stress test, normal echo, no arrhythmia's per event monitor);   (05-28-2021 per pt no symptoms since 03/ 2022)   Multiple thyroid nodules    endocrinologist--- dr g. nida,  hx benign bx,  clinically euthyroid   Osteopenia 12/2017   T score -1.2 FRAX 4.9% / 0.3%   PMB (postmenopausal bleeding)    Post-COVID chronic dyspnea    Rosacea    Thickened endometrium     Current Outpatient Medications:    acetaminophen (TYLENOL) 325 MG tablet, Take 2 tablets (650 mg total) by mouth in the morning, at noon, and at bedtime., Disp: , Rfl:    albuterol (VENTOLIN HFA) 108 (90 Base) MCG/ACT inhaler, Inhale 2 puffs into the lungs every 6 (six) hours as needed., Disp: 18 g, Rfl: 2   fidaxomicin (DIFICID) 200 MG TABS tablet, Take 1 tablet (200 mg total) by mouth 2 (two) times daily for 10 days., Disp: 20 tablet, Rfl: 0   furosemide (LASIX) 40 MG tablet, Take 1.5 tablets (60 mg total) by mouth daily., Disp: 135 tablet, Rfl: 3   HYDROmorphone (DILAUDID) 2 MG tablet, Take 1 tablet (2 mg total) by mouth every 3 (three) hours as needed for severe pain., Disp: 120 tablet, Rfl: 0   ondansetron (ZOFRAN) 8 MG tablet, Take 1 tablet (8 mg total) by mouth every 8 (eight) hours as needed for nausea or vomiting., Disp: 30 tablet, Rfl: 3   progesterone (PROMETRIUM) 200 MG capsule, Nightly 10 days every 3 months, Disp: 30 capsule, Rfl: 4  Gen: nontoxic female, appears well hydrated. No pallor observed Pulm: normal WOB on room air. No dyspnea with speech Neuro: AOx3.   Assessment/ Plan: 62 y.o. female   Hilar mass  C. difficile diarrhea  Acute pain  She has very close follow-up for hilar mass.  She will contact me if for what ever reason deficit is not available at the pharmacy for the C. difficile.  Could consider treatment with vancomycin as an alternative.  She will contact me at 4 PM to let me know if this was available to her or not.  With regards to her acute pain in the setting of hilar mass and other complications of her adrenal hemorrhage, she is on the Dilaudid 2 mg and this seems to be helping.  No concerning features of opioid overdose or intolerance.  We discussed that as her abdominal pain  improves with treatment of C. difficile she could consider backing down on this a little bit.  I discussed with her that I would be glad to take over this pain medication if Dr. Ellin Saba prefers.  She will contact me if this is needed prior to her 2-week follow-up.  She understands that we will complete controlled substance contract and urine drug screen at that time if needed.  Though of course the goal will be to taper her off of opioid entirely at some point given the risk of the medication.  I foresee no difficulty doing this with this  patient as she demonstrates no signs or symptoms concerning for opioid dependence at this time.  Start time: 11:17 End time: 11:34a  Total time spent on patient care (including video visit/ documentation): 20 minutes  Yasmin Dibello Hulen Skains, DO [facility] [phone]

## 2023-06-01 ENCOUNTER — Encounter: Payer: Self-pay | Admitting: Emergency Medicine

## 2023-06-01 ENCOUNTER — Ambulatory Visit: Admitting: Emergency Medicine

## 2023-06-01 ENCOUNTER — Telehealth: Payer: Self-pay | Admitting: Emergency Medicine

## 2023-06-01 VITALS — BP 110/66 | HR 98 | Temp 101.0°F | Ht 66.0 in | Wt 234.6 lb

## 2023-06-01 DIAGNOSIS — C3490 Malignant neoplasm of unspecified part of unspecified bronchus or lung: Secondary | ICD-10-CM

## 2023-06-01 DIAGNOSIS — R918 Other nonspecific abnormal finding of lung field: Secondary | ICD-10-CM | POA: Diagnosis not present

## 2023-06-01 DIAGNOSIS — R911 Solitary pulmonary nodule: Secondary | ICD-10-CM | POA: Diagnosis not present

## 2023-06-01 NOTE — Assessment & Plan Note (Signed)
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  She also has a groundglass right upper lobe pulmonary nodule of unclear significance.  Discussed with her bronchoscopy with EBUS to evaluate the mass as well as navigational bronchoscopy to biopsy the nodule if it is approachable.  We will work on scheduling for 06/14/2023.  We will arrange for bronchoscopy to evaluate left hilar mass and a right upper lobe pulmonary nodule.  This will be done as an outpatient under general anesthesia at [facility] endoscopy.  You will need a designated driver.  We will try to get this done on either 8/12 or 06/15/2023. Please follow T. Parrett in our office about 1 week after your procedure.

## 2023-06-01 NOTE — Telephone Encounter (Signed)
ATC LMTCB x1

## 2023-06-01 NOTE — Telephone Encounter (Signed)
Patient checking on message for appointment for today. Patient phone number is [phone].

## 2023-06-01 NOTE — H&P (View-Only) (Signed)
 Subjective:    Patient ID: Miranda Perkins, female    DOB: 1961/08/30, 62 y.o.   MRN: [id number]  HPI 62 year old former smoker (10 pack years) with a history of CVA, adrenal hemorrhage and known right adrenal mass.  She was admitted with abdominal pain suspected due to the adrenal hemorrhage.  Found to have a left hilar mass on CT imaging.  She has been seen by oncology and a PET scan has been performed as below.  She is also had diarrhea since that discharge and was recently found to be positive for C. difficile toxin, on treatment.  She reports that her diarrhea is improved.  Still has some exertional dyspnea, cough.  CT-PA 05/20/2023 reviewed by me, shows enlarged right adrenal 8.9 x 6.7 cm with an area of low density that likely reflects organizing hematoma, no pulmonary embolism, a solid left hilar mass 5.3 x 3.9 cm that partially encases the left-sided pulmonary vessels with some mild narrowing of the left lower lobe bronchus.  There is an irregular focus of airspace disease in the left lower lobe, question postobstructive.  There is also right upper lobe groundglass nodule 7 mm  PET scan 05/27/2023 shows hypermetabolism in the left hilar mass, right high paratracheal lymph node that is hypermetabolic.  Final read is pending   Review of Systems As per HPI  Past Medical History:  Diagnosis Date   Adrenal hemorrhage (HCC)    Arthritis    Edema of both lower extremities    Endometrial polyp    Hirsutism    History of 2019 novel coronavirus disease (COVID-19) 12/21/2020   positive home result documented in pcp note in epic 12-23-2020,  residual doe, pulmonology consult w/ dr wert note in epic 03-20-2021   History of basal cell carcinoma (BCC) excision 2016   forehead, per pt no recurrence   History of CVA (cerebrovascular accident) without residual deficits 1993   pt stated , while on Chloramphenicol, no residuaL; [medication given to treat tick bite] ; patient states  "i stroked out  right next to my doctor , he said i passed out and that was my only symptom" ;  no resiudal   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 branch,  work-up results in epic 03/ 2022 (normal nuclear stress test, normal echo, no arrhythmia's per event monitor);   (05-28-2021 per pt no symptoms since 03/ 2022)   Multiple thyroid nodules    endocrinologist--- dr g. nida,  hx benign bx, clinically euthyroid   Osteopenia 12/2017   T score -1.2 FRAX 4.9% / 0.3%   PMB (postmenopausal bleeding)    Post-COVID chronic dyspnea    Rosacea    Thickened endometrium      Family History  Problem Relation Age of Onset   Diabetes Mother    Heart Problems Mother    Rheum arthritis Mother    COPD Mother    Polycystic kidney disease Mother    Carpal tunnel syndrome Mother    Cancer Father        Liver Cancer   Liver cancer Father    Carpal tunnel syndrome Sister    Thyroid disease Sister    Rheum arthritis Sister    Lung disease Sister    Diabetes Sister    Carpal tunnel syndrome Sister  Clotting disorder Sister    Rheum arthritis Sister    Thyroid disease Sister    COPD Sister    Heart Problems Sister    Gout Sister    Cancer Brother    Rheum arthritis Maternal Aunt    Fibromyalgia Maternal Aunt    Polycystic kidney disease Maternal Aunt    Heart Problems Maternal Aunt    Anemia Maternal Aunt    Adrenal disorder Maternal Aunt    Carpal tunnel syndrome Maternal Aunt    Diabetes Maternal Aunt    Thyroid disease Maternal Aunt    Rheum arthritis Maternal Aunt    Diabetes Maternal Aunt    Diabetes Maternal Aunt    Rheum arthritis Maternal Aunt    Heart Problems Maternal Aunt    Rheum arthritis Maternal Uncle    Heart Problems Maternal Uncle    Heart Problems Maternal Grandmother    Heart Problems Maternal Grandfather    Cancer Maternal  Grandfather        Lung Cancer   Heart Problems Paternal Grandmother    Cancer Paternal Grandfather        Brain & Skin cancer   Polycystic ovary syndrome Daughter      Social History   Socioeconomic History   Marital status: Married    Spouse name: Not on file   Number of children: 6   Years of education: Not on file   Highest education level: Not on file  Occupational History   Occupation: retired    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, television/film/video for [employer]   Tobacco Use   Smoking status: Former    Current packs/day: 0.00    Average packs/day: 0.5 packs/day for 35.0 years (17.5 ttl pk-yrs)    Types: Cigarettes    Start date: 11/26/1974    Quit date: 11/26/2009    Years since quitting: 13.5   Smokeless tobacco: Never  Vaping Use   Vaping status: Never Used  Substance and Sexual Activity   Alcohol use: Not Currently   Drug use: No   Sexual activity: Not Currently    Partners: Male    Birth control/protection: Post-menopausal    Comment: 1st intercourse 72 yo-5 partners, married- 25 yrs  Other Topics Concern   Not on file  Social History Narrative   Lives with husband. Raising 2 grandchildren of deceased daughter.       Lives in one story ranch house.    Social Determinants of Health   Financial Resource Strain: Low Risk  (04/27/2023)   Overall Financial Resource Strain (CARDIA)    Difficulty of Paying Living Expenses: Not hard at all  Food Insecurity: No Food Insecurity (05/20/2023)   Hunger Vital Sign    Worried About Running Out of Food in the Last Year: Never true    Ran Out of Food in the Last Year: Never true  Transportation Needs: No Transportation Needs (05/20/2023)   PRAPARE - Administrator, Civil Service (Medical): No    Lack of Transportation (Non-Medical): No  Physical Activity: Sufficiently Active (04/27/2023)   Exercise Vital Sign    Days of Exercise per Week: 2 days    Minutes of Exercise per Session: 90 min  Stress: No Stress Concern Present  (04/27/2023)   [employer] of Occupational Health - Occupational Stress Questionnaire    Feeling of Stress : Only a little  Social Connections: Moderately Isolated (04/27/2023)   Social Connection and Isolation Panel [NHANES]    Frequency of Communication with Friends and Family:  More than three times a week    Frequency of Social Gatherings with Friends and Family: Never    Attends Religious Services: Never    Database administrator or Organizations: No    Attends Banker Meetings: Never    Marital Status: Married  Catering manager Violence: Not At Risk (05/20/2023)   Humiliation, Afraid, Rape, and Kick questionnaire    Fear of Current or Ex-Partner: No    Emotionally Abused: No    Physically Abused: No    Sexually Abused: No     Allergies  Allergen Reactions   Influenza Vaccines Anaphylaxis and Other (See Comments)    Per patient   Penicillins Anaphylaxis and Other (See Comments)    PATIENT HAS HAD A PCN REACTION WITH IMMEDIATE RASH, FACIAL/TONGUE/THROAT SWELLING, SOB, OR LIGHTHEADEDNESS WITH HYPOTENSION:  #  #  #  YES  #  #  #   Has patient had a PCN reaction causing severe rash involving mucus membranes or skin necrosis: No PATIENT HAS HAD A PCN REACTION THAT REQUIRED HOSPITALIZATION:  #  #  #  YES  #  #  #  Has patient had a PCN reaction occurring within the last 10 years: No    Articaine Other (See Comments)    Caused infection   Cortisone Other (See Comments)    Turned red and ran a low grade fever for 3 days   Other Rash    bandaids- skin turns red and a rash      Outpatient Medications Prior to Visit  Medication Sig Dispense Refill   acetaminophen (TYLENOL) 325 MG tablet Take 2 tablets (650 mg total) by mouth in the morning, at noon, and at bedtime.     albuterol (VENTOLIN HFA) 108 (90 Base) MCG/ACT inhaler Inhale 2 puffs into the lungs every 6 (six) hours as needed. 18 g 2   fidaxomicin (DIFICID) 200 MG TABS tablet Take 1 tablet (200 mg total) by  mouth 2 (two) times daily for 10 days. 20 tablet 0   furosemide (LASIX) 40 MG tablet Take 1.5 tablets (60 mg total) by mouth daily. 135 tablet 3   HYDROmorphone (DILAUDID) 2 MG tablet Take 1 tablet (2 mg total) by mouth every 3 (three) hours as needed for severe pain. 120 tablet 0   ondansetron (ZOFRAN) 8 MG tablet Take 1 tablet (8 mg total) by mouth every 8 (eight) hours as needed for nausea or vomiting. 30 tablet 3   progesterone (PROMETRIUM) 200 MG capsule Nightly 10 days every 3 months 30 capsule 4   No facility-administered medications prior to visit.         Objective:   Physical Exam Vitals:   06/01/23 1541  BP: 110/66  Pulse: 98  Temp: (!) 101 F (38.3 C)  TempSrc: Oral  SpO2: 94%  Weight: 234 lb 9.6 oz (106.4 kg)  Height: 5\' 6"  (1.676 m)    Gen: Pleasant, overweight woman, in no distress,  normal affect  ENT: No lesions,  mouth clear,  oropharynx clear, no postnasal drip  Neck: No JVD, no stridor  Lungs: No use of accessory muscles, no crackles or wheezing on normal respiration, no wheeze on forced expiration  Cardiovascular: RRR, heart sounds normal, no murmur or gallops, no peripheral edema  Musculoskeletal: No deformities, no cyanosis or clubbing  Neuro: alert, awake, non focal  Skin: Warm, no lesions or rash       Assessment & Plan:  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  She also has a groundglass right upper lobe pulmonary nodule of unclear significance.  Discussed with her bronchoscopy with EBUS to evaluate the mass as well as navigational bronchoscopy to biopsy the nodule if it is approachable.  We will work on scheduling for 06/14/2023.  We will arrange for bronchoscopy to evaluate left hilar mass and a right upper lobe pulmonary nodule.  This will be done as an outpatient under general anesthesia at [facility] endoscopy.  You will need a designated driver.   We will try to get this done on either 8/12 or 06/15/2023. Please follow T. Parrett in our office about 1 week after your procedure.   Levy Pupa, MD, PhD 06/01/2023, 4:38 PM [facility] [phone] or if no answer before 7:00PM call [phone] For any issues after 7:00PM please call eLink [phone]

## 2023-06-01 NOTE — Patient Instructions (Signed)
We will arrange for bronchoscopy to evaluate left hilar mass and a right upper lobe pulmonary nodule.  This will be done as an outpatient under general anesthesia at [facility] endoscopy.  You will need a designated driver.  We will try to get this done on either 8/12 or 06/15/2023. Please follow T. Parrett in our office about 1 week after your procedure.

## 2023-06-01 NOTE — Telephone Encounter (Signed)
Patient diagnosed with C Diff. Would like to know if she should come in for appointment today. Patient phone number is [phone].

## 2023-06-01 NOTE — Progress Notes (Signed)
Subjective:    Patient ID: Miranda Perkins, female    DOB: 1961/08/30, 62 y.o.   MRN: [id number]  HPI 62 year old former smoker (10 pack years) with a history of CVA, adrenal hemorrhage and known right adrenal mass.  She was admitted with abdominal pain suspected due to the adrenal hemorrhage.  Found to have a left hilar mass on CT imaging.  She has been seen by oncology and a PET scan has been performed as below.  She is also had diarrhea since that discharge and was recently found to be positive for C. difficile toxin, on treatment.  She reports that her diarrhea is improved.  Still has some exertional dyspnea, cough.  CT-PA 05/20/2023 reviewed by me, shows enlarged right adrenal 8.9 x 6.7 cm with an area of low density that likely reflects organizing hematoma, no pulmonary embolism, a solid left hilar mass 5.3 x 3.9 cm that partially encases the left-sided pulmonary vessels with some mild narrowing of the left lower lobe bronchus.  There is an irregular focus of airspace disease in the left lower lobe, question postobstructive.  There is also right upper lobe groundglass nodule 7 mm  PET scan 05/27/2023 shows hypermetabolism in the left hilar mass, right high paratracheal lymph node that is hypermetabolic.  Final read is pending   Review of Systems As per HPI  Past Medical History:  Diagnosis Date   Adrenal hemorrhage (HCC)    Arthritis    Edema of both lower extremities    Endometrial polyp    Hirsutism    History of 2019 novel coronavirus disease (COVID-19) 12/21/2020   positive home result documented in pcp note in epic 12-23-2020,  residual doe, pulmonology consult w/ dr wert note in epic 03-20-2021   History of basal cell carcinoma (BCC) excision 2016   forehead, per pt no recurrence   History of CVA (cerebrovascular accident) without residual deficits 1993   pt stated , while on Chloramphenicol, no residuaL; [medication given to treat tick bite] ; patient states  "i stroked out  right next to my doctor , he said i passed out and that was my only symptom" ;  no resiudal   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 branch,  work-up results in epic 03/ 2022 (normal nuclear stress test, normal echo, no arrhythmia's per event monitor);   (05-28-2021 per pt no symptoms since 03/ 2022)   Multiple thyroid nodules    endocrinologist--- dr g. nida,  hx benign bx, clinically euthyroid   Osteopenia 12/2017   T score -1.2 FRAX 4.9% / 0.3%   PMB (postmenopausal bleeding)    Post-COVID chronic dyspnea    Rosacea    Thickened endometrium      Family History  Problem Relation Age of Onset   Diabetes Mother    Heart Problems Mother    Rheum arthritis Mother    COPD Mother    Polycystic kidney disease Mother    Carpal tunnel syndrome Mother    Cancer Father        Liver Cancer   Liver cancer Father    Carpal tunnel syndrome Sister    Thyroid disease Sister    Rheum arthritis Sister    Lung disease Sister    Diabetes Sister    Carpal tunnel syndrome Sister  Clotting disorder Sister    Rheum arthritis Sister    Thyroid disease Sister    COPD Sister    Heart Problems Sister    Gout Sister    Cancer Brother    Rheum arthritis Maternal Aunt    Fibromyalgia Maternal Aunt    Polycystic kidney disease Maternal Aunt    Heart Problems Maternal Aunt    Anemia Maternal Aunt    Adrenal disorder Maternal Aunt    Carpal tunnel syndrome Maternal Aunt    Diabetes Maternal Aunt    Thyroid disease Maternal Aunt    Rheum arthritis Maternal Aunt    Diabetes Maternal Aunt    Diabetes Maternal Aunt    Rheum arthritis Maternal Aunt    Heart Problems Maternal Aunt    Rheum arthritis Maternal Uncle    Heart Problems Maternal Uncle    Heart Problems Maternal Grandmother    Heart Problems Maternal Grandfather    Cancer Maternal  Grandfather        Lung Cancer   Heart Problems Paternal Grandmother    Cancer Paternal Grandfather        Brain & Skin cancer   Polycystic ovary syndrome Daughter      Social History   Socioeconomic History   Marital status: Married    Spouse name: Not on file   Number of children: 6   Years of education: Not on file   Highest education level: Not on file  Occupational History   Occupation: retired    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, television/film/video for [employer]   Tobacco Use   Smoking status: Former    Current packs/day: 0.00    Average packs/day: 0.5 packs/day for 35.0 years (17.5 ttl pk-yrs)    Types: Cigarettes    Start date: 11/26/1974    Quit date: 11/26/2009    Years since quitting: 13.5   Smokeless tobacco: Never  Vaping Use   Vaping status: Never Used  Substance and Sexual Activity   Alcohol use: Not Currently   Drug use: No   Sexual activity: Not Currently    Partners: Male    Birth control/protection: Post-menopausal    Comment: 1st intercourse 72 yo-5 partners, married- 25 yrs  Other Topics Concern   Not on file  Social History Narrative   Lives with husband. Raising 2 grandchildren of deceased daughter.       Lives in one story ranch house.    Social Determinants of Health   Financial Resource Strain: Low Risk  (04/27/2023)   Overall Financial Resource Strain (CARDIA)    Difficulty of Paying Living Expenses: Not hard at all  Food Insecurity: No Food Insecurity (05/20/2023)   Hunger Vital Sign    Worried About Running Out of Food in the Last Year: Never true    Ran Out of Food in the Last Year: Never true  Transportation Needs: No Transportation Needs (05/20/2023)   PRAPARE - Administrator, Civil Service (Medical): No    Lack of Transportation (Non-Medical): No  Physical Activity: Sufficiently Active (04/27/2023)   Exercise Vital Sign    Days of Exercise per Week: 2 days    Minutes of Exercise per Session: 90 min  Stress: No Stress Concern Present  (04/27/2023)   [employer] of Occupational Health - Occupational Stress Questionnaire    Feeling of Stress : Only a little  Social Connections: Moderately Isolated (04/27/2023)   Social Connection and Isolation Panel [NHANES]    Frequency of Communication with Friends and Family:  More than three times a week    Frequency of Social Gatherings with Friends and Family: Never    Attends Religious Services: Never    Database administrator or Organizations: No    Attends Banker Meetings: Never    Marital Status: Married  Catering manager Violence: Not At Risk (05/20/2023)   Humiliation, Afraid, Rape, and Kick questionnaire    Fear of Current or Ex-Partner: No    Emotionally Abused: No    Physically Abused: No    Sexually Abused: No     Allergies  Allergen Reactions   Influenza Vaccines Anaphylaxis and Other (See Comments)    Per patient   Penicillins Anaphylaxis and Other (See Comments)    PATIENT HAS HAD A PCN REACTION WITH IMMEDIATE RASH, FACIAL/TONGUE/THROAT SWELLING, SOB, OR LIGHTHEADEDNESS WITH HYPOTENSION:  #  #  #  YES  #  #  #   Has patient had a PCN reaction causing severe rash involving mucus membranes or skin necrosis: No PATIENT HAS HAD A PCN REACTION THAT REQUIRED HOSPITALIZATION:  #  #  #  YES  #  #  #  Has patient had a PCN reaction occurring within the last 10 years: No    Articaine Other (See Comments)    Caused infection   Cortisone Other (See Comments)    Turned red and ran a low grade fever for 3 days   Other Rash    bandaids- skin turns red and a rash      Outpatient Medications Prior to Visit  Medication Sig Dispense Refill   acetaminophen (TYLENOL) 325 MG tablet Take 2 tablets (650 mg total) by mouth in the morning, at noon, and at bedtime.     albuterol (VENTOLIN HFA) 108 (90 Base) MCG/ACT inhaler Inhale 2 puffs into the lungs every 6 (six) hours as needed. 18 g 2   fidaxomicin (DIFICID) 200 MG TABS tablet Take 1 tablet (200 mg total) by  mouth 2 (two) times daily for 10 days. 20 tablet 0   furosemide (LASIX) 40 MG tablet Take 1.5 tablets (60 mg total) by mouth daily. 135 tablet 3   HYDROmorphone (DILAUDID) 2 MG tablet Take 1 tablet (2 mg total) by mouth every 3 (three) hours as needed for severe pain. 120 tablet 0   ondansetron (ZOFRAN) 8 MG tablet Take 1 tablet (8 mg total) by mouth every 8 (eight) hours as needed for nausea or vomiting. 30 tablet 3   progesterone (PROMETRIUM) 200 MG capsule Nightly 10 days every 3 months 30 capsule 4   No facility-administered medications prior to visit.         Objective:   Physical Exam Vitals:   06/01/23 1541  BP: 110/66  Pulse: 98  Temp: (!) 101 F (38.3 C)  TempSrc: Oral  SpO2: 94%  Weight: 234 lb 9.6 oz (106.4 kg)  Height: 5\' 6"  (1.676 m)    Gen: Pleasant, overweight woman, in no distress,  normal affect  ENT: No lesions,  mouth clear,  oropharynx clear, no postnasal drip  Neck: No JVD, no stridor  Lungs: No use of accessory muscles, no crackles or wheezing on normal respiration, no wheeze on forced expiration  Cardiovascular: RRR, heart sounds normal, no murmur or gallops, no peripheral edema  Musculoskeletal: No deformities, no cyanosis or clubbing  Neuro: alert, awake, non focal  Skin: Warm, no lesions or rash       Assessment & Plan:  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  She also has a groundglass right upper lobe pulmonary nodule of unclear significance.  Discussed with her bronchoscopy with EBUS to evaluate the mass as well as navigational bronchoscopy to biopsy the nodule if it is approachable.  We will work on scheduling for 06/14/2023.  We will arrange for bronchoscopy to evaluate left hilar mass and a right upper lobe pulmonary nodule.  This will be done as an outpatient under general anesthesia at [facility] endoscopy.  You will need a designated driver.   We will try to get this done on either 8/12 or 06/15/2023. Please follow T. Parrett in our office about 1 week after your procedure.   Levy Pupa, MD, PhD 06/01/2023, 4:38 PM [facility] [phone] or if no answer before 7:00PM call [phone] For any issues after 7:00PM please call eLink [phone]

## 2023-06-02 NOTE — Telephone Encounter (Signed)
Closing encounter since patient was seen yesterday. NFN

## 2023-06-09 ENCOUNTER — Inpatient Hospital Stay

## 2023-06-09 ENCOUNTER — Telehealth: Payer: Self-pay | Admitting: *Deleted

## 2023-06-09 ENCOUNTER — Other Ambulatory Visit

## 2023-06-09 ENCOUNTER — Other Ambulatory Visit: Payer: Self-pay

## 2023-06-09 ENCOUNTER — Inpatient Hospital Stay: Attending: Hematology

## 2023-06-09 VITALS — BP 129/78 | HR 93 | Temp 98.2°F | Resp 19

## 2023-06-09 DIAGNOSIS — F32A Depression, unspecified: Secondary | ICD-10-CM | POA: Diagnosis present

## 2023-06-09 DIAGNOSIS — Z85819 Personal history of malignant neoplasm of unspecified site of lip, oral cavity, and pharynx: Secondary | ICD-10-CM

## 2023-06-09 DIAGNOSIS — R748 Abnormal levels of other serum enzymes: Secondary | ICD-10-CM | POA: Diagnosis present

## 2023-06-09 DIAGNOSIS — Z801 Family history of malignant neoplasm of trachea, bronchus and lung: Secondary | ICD-10-CM

## 2023-06-09 DIAGNOSIS — Z880 Allergy status to penicillin: Secondary | ICD-10-CM

## 2023-06-09 DIAGNOSIS — Z808 Family history of malignant neoplasm of other organs or systems: Secondary | ICD-10-CM

## 2023-06-09 DIAGNOSIS — C3490 Malignant neoplasm of unspecified part of unspecified bronchus or lung: Secondary | ICD-10-CM

## 2023-06-09 DIAGNOSIS — R197 Diarrhea, unspecified: Secondary | ICD-10-CM | POA: Insufficient documentation

## 2023-06-09 DIAGNOSIS — K921 Melena: Secondary | ICD-10-CM | POA: Diagnosis present

## 2023-06-09 DIAGNOSIS — Z825 Family history of asthma and other chronic lower respiratory diseases: Secondary | ICD-10-CM

## 2023-06-09 DIAGNOSIS — R110 Nausea: Secondary | ICD-10-CM | POA: Insufficient documentation

## 2023-06-09 DIAGNOSIS — Z79899 Other long term (current) drug therapy: Secondary | ICD-10-CM | POA: Insufficient documentation

## 2023-06-09 DIAGNOSIS — Z87891 Personal history of nicotine dependence: Secondary | ICD-10-CM

## 2023-06-09 DIAGNOSIS — Z8271 Family history of polycystic kidney: Secondary | ICD-10-CM

## 2023-06-09 DIAGNOSIS — Z8616 Personal history of COVID-19: Secondary | ICD-10-CM

## 2023-06-09 DIAGNOSIS — E041 Nontoxic single thyroid nodule: Secondary | ICD-10-CM | POA: Diagnosis present

## 2023-06-09 DIAGNOSIS — Z515 Encounter for palliative care: Secondary | ICD-10-CM

## 2023-06-09 DIAGNOSIS — J90 Pleural effusion, not elsewhere classified: Secondary | ICD-10-CM | POA: Diagnosis present

## 2023-06-09 DIAGNOSIS — C3492 Malignant neoplasm of unspecified part of left bronchus or lung: Secondary | ICD-10-CM | POA: Insufficient documentation

## 2023-06-09 DIAGNOSIS — Z96652 Presence of left artificial knee joint: Secondary | ICD-10-CM | POA: Diagnosis present

## 2023-06-09 DIAGNOSIS — Z800 Family history of malignant neoplasm of digestive organs: Secondary | ICD-10-CM

## 2023-06-09 DIAGNOSIS — C7971 Secondary malignant neoplasm of right adrenal gland: Secondary | ICD-10-CM | POA: Diagnosis present

## 2023-06-09 DIAGNOSIS — R7303 Prediabetes: Secondary | ICD-10-CM | POA: Diagnosis present

## 2023-06-09 DIAGNOSIS — R7401 Elevation of levels of liver transaminase levels: Secondary | ICD-10-CM | POA: Diagnosis present

## 2023-06-09 DIAGNOSIS — Z833 Family history of diabetes mellitus: Secondary | ICD-10-CM

## 2023-06-09 DIAGNOSIS — E782 Mixed hyperlipidemia: Secondary | ICD-10-CM | POA: Diagnosis present

## 2023-06-09 DIAGNOSIS — J45991 Cough variant asthma: Secondary | ICD-10-CM | POA: Diagnosis present

## 2023-06-09 DIAGNOSIS — I700 Atherosclerosis of aorta: Secondary | ICD-10-CM | POA: Diagnosis present

## 2023-06-09 DIAGNOSIS — Z85828 Personal history of other malignant neoplasm of skin: Secondary | ICD-10-CM

## 2023-06-09 DIAGNOSIS — Z8619 Personal history of other infectious and parasitic diseases: Secondary | ICD-10-CM

## 2023-06-09 DIAGNOSIS — Z981 Arthrodesis status: Secondary | ICD-10-CM

## 2023-06-09 DIAGNOSIS — Z8349 Family history of other endocrine, nutritional and metabolic diseases: Secondary | ICD-10-CM

## 2023-06-09 DIAGNOSIS — G893 Neoplasm related pain (acute) (chronic): Secondary | ICD-10-CM | POA: Diagnosis present

## 2023-06-09 DIAGNOSIS — Z8673 Personal history of transient ischemic attack (TIA), and cerebral infarction without residual deficits: Secondary | ICD-10-CM

## 2023-06-09 DIAGNOSIS — Z8261 Family history of arthritis: Secondary | ICD-10-CM

## 2023-06-09 DIAGNOSIS — Z832 Family history of diseases of the blood and blood-forming organs and certain disorders involving the immune mechanism: Secondary | ICD-10-CM

## 2023-06-09 DIAGNOSIS — Z6836 Body mass index (BMI) 36.0-36.9, adult: Secondary | ICD-10-CM

## 2023-06-09 DIAGNOSIS — R609 Edema, unspecified: Secondary | ICD-10-CM | POA: Diagnosis present

## 2023-06-09 DIAGNOSIS — E876 Hypokalemia: Secondary | ICD-10-CM | POA: Diagnosis present

## 2023-06-09 DIAGNOSIS — R112 Nausea with vomiting, unspecified: Principal | ICD-10-CM | POA: Diagnosis present

## 2023-06-09 DIAGNOSIS — Z888 Allergy status to other drugs, medicaments and biological substances status: Secondary | ICD-10-CM

## 2023-06-09 LAB — COMPREHENSIVE METABOLIC PANEL
ALT: 83 U/L — ABNORMAL HIGH (ref 0–44)
AST: 36 U/L (ref 15–41)
Albumin: 3 g/dL — ABNORMAL LOW (ref 3.5–5.0)
Alkaline Phosphatase: 126 U/L (ref 38–126)
Anion gap: 12 (ref 5–15)
BUN: 10 mg/dL (ref 8–23)
CO2: 27 mmol/L (ref 22–32)
Calcium: 9 mg/dL (ref 8.9–10.3)
Chloride: 91 mmol/L — ABNORMAL LOW (ref 98–111)
Creatinine, Ser: 0.73 mg/dL (ref 0.44–1.00)
GFR, Estimated: >60 mL/min (ref >60)
Glucose, Bld: 120 mg/dL — ABNORMAL HIGH (ref 70–99)
Potassium: 3.4 mmol/L — ABNORMAL LOW (ref 3.5–5.1)
Sodium: 130 mmol/L — ABNORMAL LOW (ref 135–145)
Total Bilirubin: 1.2 mg/dL (ref 0.3–1.2)
Total Protein: 7.1 g/dL (ref 6.5–8.1)

## 2023-06-09 LAB — MAGNESIUM: Magnesium: 2.2 mg/dL (ref 1.7–2.4)

## 2023-06-09 MED ORDER — MAGNESIUM SULFATE 2 GM/50ML IV SOLN
2.0000 g | Freq: Once | INTRAVENOUS | Status: AC
  Administered 2023-06-09: 2 g via INTRAVENOUS
  Filled 2023-06-09: qty 50

## 2023-06-09 MED ORDER — PROMETHAZINE HCL 25 MG PO TABS: 25.0000 mg | ORAL_TABLET | Freq: Four times a day (QID) | ORAL | 1 refills | Status: DC | PRN

## 2023-06-09 MED ORDER — POTASSIUM CHLORIDE IN NACL 20-0.9 MEQ/L-% IV SOLN
Freq: Once | INTRAVENOUS | Status: AC
  Filled 2023-06-09: qty 1000

## 2023-06-09 MED ORDER — ONDANSETRON HCL 4 MG/2ML IJ SOLN
8.0000 mg | Freq: Once | INTRAMUSCULAR | Status: AC
  Administered 2023-06-09: 8 mg via INTRAVENOUS
  Filled 2023-06-09: qty 4

## 2023-06-09 NOTE — Patient Instructions (Signed)
[facility]  Discharge Instructions: Thank you for choosing [facility] to provide your oncology and hematology care.  If you have a lab appointment with the Cancer Center - please note that after April 8th, 2024, all labs will be drawn in the cancer center.  You do not have to check in or register with the main entrance as you have in the past but will complete your check-in in the cancer center.  Wear comfortable clothing and clothing appropriate for easy access to any Portacath or PICC line.   We strive to give you quality time with your provider. You may need to reschedule your appointment if you arrive late (15 or more minutes).  Arriving late affects you and other patients whose appointments are after yours.  Also, if you miss three or more appointments without notifying the office, you may be dismissed from the clinic at the provider's discretion.      For prescription refill requests, have your pharmacy contact our office and allow 72 hours for refills to be completed.    Today you received the following chemotherapy and/or immunotherapy agents House fluids      To help prevent nausea and vomiting after your treatment, we encourage you to take your nausea medication as directed.  BELOW ARE SYMPTOMS THAT SHOULD BE REPORTED IMMEDIATELY: *FEVER GREATER THAN 100.4 F (38 C) OR HIGHER *CHILLS OR SWEATING *NAUSEA AND VOMITING THAT IS NOT CONTROLLED WITH YOUR NAUSEA MEDICATION *UNUSUAL SHORTNESS OF BREATH *UNUSUAL BRUISING OR BLEEDING *URINARY PROBLEMS (pain or burning when urinating, or frequent urination) *BOWEL PROBLEMS (unusual diarrhea, constipation, pain near the anus) TENDERNESS IN MOUTH AND THROAT WITH OR WITHOUT PRESENCE OF ULCERS (sore throat, sores in mouth, or a toothache) UNUSUAL RASH, SWELLING OR PAIN  UNUSUAL VAGINAL DISCHARGE OR ITCHING   Items with * indicate a potential emergency and should be followed up as soon as possible or go to  the Emergency Department if any problems should occur.  Please show the CHEMOTHERAPY ALERT CARD or IMMUNOTHERAPY ALERT CARD at check-in to the Emergency Department and triage nurse.  Should you have questions after your visit or need to cancel or reschedule your appointment, please contact [facility] [phone]  and follow the prompts.  Office hours are 8:00 a.m. to 4:30 p.m. Monday - Friday. Please note that voicemails left after 4:00 p.m. may not be returned until the following business day.  We are closed weekends and major holidays. You have access to a nurse at all times for urgent questions. Please call the main number to the clinic [phone] and follow the prompts.  For any non-urgent questions, you may also contact your provider using MyChart. We now offer e-Visits for anyone 18 and older to request care online for non-urgent symptoms. For details visit [email or web address].   Also download the MyChart app! Go to the app store, search "MyChart", open the app, select [facility], and log in with your MyChart username and password.   

## 2023-06-09 NOTE — Telephone Encounter (Signed)
Received call from patient informing that she has had very poor PO intake and has been vomiting each time she tries to take anything in.  She is also feeling very weak.  She will be added to the lab and infusion schedule to receive house fluids.

## 2023-06-09 NOTE — Progress Notes (Signed)
Patient present today for House fluids and Zofran per providers order.  Vital signs stable.  Patient has been having nausea and vomiting prior to appt.  Peripheral IV started and blood return noted pre and post infusion.  Stable during infusion without adverse affects.  Vital signs stable.  No complaints at this time.  Discharge from clinic ambulatory in stable condition.  Alert and oriented X 3.  Follow up with [facility] as scheduled.

## 2023-06-10 ENCOUNTER — Other Ambulatory Visit: Payer: Self-pay

## 2023-06-10 ENCOUNTER — Encounter (HOSPITAL_COMMUNITY): Payer: Self-pay | Admitting: Emergency Medicine

## 2023-06-10 ENCOUNTER — Other Ambulatory Visit: Payer: Self-pay | Admitting: *Deleted

## 2023-06-10 MED ORDER — PROMETHAZINE HCL 25 MG RE SUPP: 25.0000 mg | Freq: Four times a day (QID) | RECTAL | 0 refills | Status: DC | PRN

## 2023-06-10 NOTE — Progress Notes (Addendum)
Mrs Miranda Perkins denies chest pain or shortness of breath.  Patient denies having any s/s of Covid in her household, also denies any known exposure to Covid. Mrs Miranda Perkins denies  any s/s of upper or lower respiratory infection in the past 8 weeks. Mrs Miranda Perkins has a cough, productive, at times, she does not have a feverit is thought to be from the lung cancer.  Mrs. Miranda Perkins PCP is Dr. Delynn Flavin, cardiologist is Dr. [name]   Mrs. Miranda Perkins was diagnosed with C-Diff 05/28/23, patient took 10 days of antibiotics, she no longer has diarrhea.  Mrs. Miranda Perkins is unable to eat and keep food down, patient has lots of vomiting- Patient was given Zofran, it did not work, patient has a prescription for Phenergan, she is unable to keep it down.  Mrs. Miranda Perkins was seen yesterday at the [facility] for IV fluids.  Patients aid she may have to go again today for fluids.  I encouraged patient to ask for a prescription for Phenergan suppository. Dr. Ellison Carwin is Mrs. Miranda Perkins's oncologist.  I am asking anesthesia to review chart.

## 2023-06-10 NOTE — Telephone Encounter (Signed)
Patient called stating that she is still vomiting and weak, despite fluids yesterday.  Per Dr. Marice Potter recommendations, phenergan 25 mg suppositories sent to her pharmacy.   Patient aware and will notify us if these are not effective.

## 2023-06-11 ENCOUNTER — Emergency Department (HOSPITAL_COMMUNITY)

## 2023-06-11 ENCOUNTER — Inpatient Hospital Stay (HOSPITAL_COMMUNITY)
Admission: EM | Admit: 2023-06-11 | Discharge: 2023-06-13 | DRG: 392 | Disposition: A | Discharge status: Home / Self Care | Attending: Internal Medicine | Admitting: Internal Medicine

## 2023-06-11 ENCOUNTER — Encounter (HOSPITAL_COMMUNITY): Payer: Self-pay | Admitting: Emergency Medicine

## 2023-06-11 ENCOUNTER — Other Ambulatory Visit: Payer: Self-pay

## 2023-06-11 ENCOUNTER — Ambulatory Visit: Admitting: Family Medicine

## 2023-06-11 DIAGNOSIS — R112 Nausea with vomiting, unspecified: Secondary | ICD-10-CM

## 2023-06-11 DIAGNOSIS — E041 Nontoxic single thyroid nodule: Secondary | ICD-10-CM | POA: Diagnosis not present

## 2023-06-11 DIAGNOSIS — R1010 Upper abdominal pain, unspecified: Principal | ICD-10-CM

## 2023-06-11 DIAGNOSIS — Z96652 Presence of left artificial knee joint: Secondary | ICD-10-CM | POA: Diagnosis not present

## 2023-06-11 DIAGNOSIS — F32A Depression, unspecified: Secondary | ICD-10-CM | POA: Diagnosis not present

## 2023-06-11 DIAGNOSIS — E782 Mixed hyperlipidemia: Secondary | ICD-10-CM | POA: Diagnosis present

## 2023-06-11 DIAGNOSIS — R52 Pain, unspecified: Secondary | ICD-10-CM | POA: Diagnosis present

## 2023-06-11 DIAGNOSIS — R748 Abnormal levels of other serum enzymes: Secondary | ICD-10-CM | POA: Diagnosis not present

## 2023-06-11 DIAGNOSIS — C3492 Malignant neoplasm of unspecified part of left bronchus or lung: Secondary | ICD-10-CM | POA: Diagnosis not present

## 2023-06-11 DIAGNOSIS — G893 Neoplasm related pain (acute) (chronic): Secondary | ICD-10-CM | POA: Diagnosis not present

## 2023-06-11 DIAGNOSIS — J90 Pleural effusion, not elsewhere classified: Secondary | ICD-10-CM | POA: Diagnosis not present

## 2023-06-11 DIAGNOSIS — C3490 Malignant neoplasm of unspecified part of unspecified bronchus or lung: Secondary | ICD-10-CM | POA: Diagnosis present

## 2023-06-11 DIAGNOSIS — R7303 Prediabetes: Secondary | ICD-10-CM | POA: Diagnosis not present

## 2023-06-11 DIAGNOSIS — E876 Hypokalemia: Secondary | ICD-10-CM

## 2023-06-11 DIAGNOSIS — R058 Other specified cough: Secondary | ICD-10-CM | POA: Diagnosis present

## 2023-06-11 DIAGNOSIS — Z8673 Personal history of transient ischemic attack (TIA), and cerebral infarction without residual deficits: Secondary | ICD-10-CM | POA: Diagnosis not present

## 2023-06-11 DIAGNOSIS — Z7189 Other specified counseling: Secondary | ICD-10-CM

## 2023-06-11 DIAGNOSIS — R609 Edema, unspecified: Secondary | ICD-10-CM | POA: Diagnosis not present

## 2023-06-11 DIAGNOSIS — I700 Atherosclerosis of aorta: Secondary | ICD-10-CM | POA: Diagnosis not present

## 2023-06-11 DIAGNOSIS — Z515 Encounter for palliative care: Secondary | ICD-10-CM | POA: Diagnosis not present

## 2023-06-11 DIAGNOSIS — Z6836 Body mass index (BMI) 36.0-36.9, adult: Secondary | ICD-10-CM | POA: Diagnosis not present

## 2023-06-11 DIAGNOSIS — C7971 Secondary malignant neoplasm of right adrenal gland: Secondary | ICD-10-CM | POA: Diagnosis not present

## 2023-06-11 DIAGNOSIS — Z87891 Personal history of nicotine dependence: Secondary | ICD-10-CM | POA: Diagnosis not present

## 2023-06-11 DIAGNOSIS — R7401 Elevation of levels of liver transaminase levels: Secondary | ICD-10-CM

## 2023-06-11 DIAGNOSIS — A0472 Enterocolitis due to Clostridium difficile, not specified as recurrent: Secondary | ICD-10-CM

## 2023-06-11 DIAGNOSIS — J45991 Cough variant asthma: Secondary | ICD-10-CM | POA: Diagnosis not present

## 2023-06-11 DIAGNOSIS — Z8619 Personal history of other infectious and parasitic diseases: Secondary | ICD-10-CM | POA: Diagnosis not present

## 2023-06-11 DIAGNOSIS — Z8616 Personal history of COVID-19: Secondary | ICD-10-CM | POA: Diagnosis not present

## 2023-06-11 DIAGNOSIS — Z85828 Personal history of other malignant neoplasm of skin: Secondary | ICD-10-CM | POA: Diagnosis not present

## 2023-06-11 DIAGNOSIS — K921 Melena: Secondary | ICD-10-CM | POA: Diagnosis not present

## 2023-06-11 LAB — CBC
HCT: 37.9 % (ref 36.0–46.0)
Hemoglobin: 12.1 g/dL (ref 12.0–15.0)
MCH: 26.3 pg (ref 26.0–34.0)
MCHC: 31.9 g/dL (ref 30.0–36.0)
MCV: 82.4 fL (ref 80.0–100.0)
Platelets: 467 10*3/uL — ABNORMAL HIGH (ref 150–400)
RBC: 4.6 MIL/uL (ref 3.87–5.11)
RDW: 14 % (ref 11.5–15.5)
WBC: 12.6 10*3/uL — ABNORMAL HIGH (ref 4.0–10.5)
nRBC: 0 % (ref 0.0–0.2)

## 2023-06-11 LAB — LIPASE, BLOOD: Lipase: 74 U/L — ABNORMAL HIGH (ref 11–51)

## 2023-06-11 LAB — COMPREHENSIVE METABOLIC PANEL
ALT: 94 U/L — ABNORMAL HIGH (ref 0–44)
AST: 43 U/L — ABNORMAL HIGH (ref 15–41)
Albumin: 2.9 g/dL — ABNORMAL LOW (ref 3.5–5.0)
Alkaline Phosphatase: 120 U/L (ref 38–126)
Anion gap: 16 — ABNORMAL HIGH (ref 5–15)
BUN: 8 mg/dL (ref 8–23)
CO2: 25 mmol/L (ref 22–32)
Calcium: 9.3 mg/dL (ref 8.9–10.3)
Chloride: 94 mmol/L — ABNORMAL LOW (ref 98–111)
Creatinine, Ser: 1 mg/dL (ref 0.44–1.00)
GFR, Estimated: >60 mL/min (ref >60)
Glucose, Bld: 124 mg/dL — ABNORMAL HIGH (ref 70–99)
Potassium: 3.1 mmol/L — ABNORMAL LOW (ref 3.5–5.1)
Sodium: 135 mmol/L (ref 135–145)
Total Bilirubin: 1.2 mg/dL (ref 0.3–1.2)
Total Protein: 7.1 g/dL (ref 6.5–8.1)

## 2023-06-11 LAB — URINALYSIS, ROUTINE W REFLEX MICROSCOPIC
Bilirubin Urine: NEGATIVE
Glucose, UA: NEGATIVE mg/dL
Hgb urine dipstick: NEGATIVE
Ketones, ur: 20 mg/dL — AB
Leukocytes,Ua: NEGATIVE
Nitrite: NEGATIVE
Protein, ur: NEGATIVE mg/dL
Specific Gravity, Urine: >1.046 — ABNORMAL HIGH (ref 1.005–1.030)
pH: 7 (ref 5.0–8.0)

## 2023-06-11 LAB — PROTIME-INR
INR: 1.3 — ABNORMAL HIGH (ref 0.8–1.2)
Prothrombin Time: 16.8 seconds — ABNORMAL HIGH (ref 11.4–15.2)

## 2023-06-11 LAB — TSH: TSH: 2.857 u[IU]/mL (ref 0.350–4.500)

## 2023-06-11 LAB — MAGNESIUM: Magnesium: 2.1 mg/dL (ref 1.7–2.4)

## 2023-06-11 MED ORDER — ALBUTEROL SULFATE (2.5 MG/3ML) 0.083% IN NEBU: 2.5000 mg | INHALATION_SOLUTION | RESPIRATORY_TRACT | Status: DC | PRN

## 2023-06-11 MED ORDER — POTASSIUM CHLORIDE 10 MEQ/100ML IV SOLN
10.0000 meq | INTRAVENOUS | Status: AC
  Administered 2023-06-11 (×3): 10 meq via INTRAVENOUS
  Filled 2023-06-11 (×3): qty 100

## 2023-06-11 MED ORDER — SODIUM CHLORIDE 0.9 % IV SOLN
12.5000 mg | Freq: Three times a day (TID) | INTRAVENOUS | Status: DC | PRN
  Administered 2023-06-11 – 2023-06-12 (×2): 12.5 mg via INTRAVENOUS
  Filled 2023-06-11 (×2): qty 0.5
  Filled 2023-06-11: qty 12.5
  Filled 2023-06-11: qty 0.5

## 2023-06-11 MED ORDER — ONDANSETRON HCL 4 MG/2ML IJ SOLN
4.0000 mg | Freq: Four times a day (QID) | INTRAMUSCULAR | Status: DC | PRN
  Administered 2023-06-11 – 2023-06-13 (×5): 4 mg via INTRAVENOUS
  Filled 2023-06-11 (×5): qty 2

## 2023-06-11 MED ORDER — PANTOPRAZOLE SODIUM 40 MG IV SOLR
40.0000 mg | INTRAVENOUS | Status: DC
  Administered 2023-06-12 – 2023-06-13 (×2): 40 mg via INTRAVENOUS
  Filled 2023-06-11 (×4): qty 10

## 2023-06-11 MED ORDER — HYDROMORPHONE HCL 1 MG/ML IJ SOLN
1.0000 mg | Freq: Once | INTRAMUSCULAR | Status: AC
  Administered 2023-06-11: 1 mg via INTRAVENOUS
  Filled 2023-06-11: qty 1

## 2023-06-11 MED ORDER — ACETAMINOPHEN 325 MG PO TABS: 650.0000 mg | ORAL_TABLET | Freq: Four times a day (QID) | ORAL | Status: DC | PRN

## 2023-06-11 MED ORDER — PANTOPRAZOLE SODIUM 40 MG IV SOLR
40.0000 mg | INTRAVENOUS | Status: DC
Start: 2023-06-11 — End: 2023-06-11
  Administered 2023-06-11: 40 mg via INTRAVENOUS

## 2023-06-11 MED ORDER — POTASSIUM CHLORIDE CRYS ER 20 MEQ PO TBCR: 40.0000 meq | EXTENDED_RELEASE_TABLET | Freq: Once | ORAL | Status: DC

## 2023-06-11 MED ORDER — IOHEXOL 350 MG/ML SOLN
75.0000 mL | Freq: Once | INTRAVENOUS | Status: AC | PRN
  Administered 2023-06-11: 75 mL via INTRAVENOUS

## 2023-06-11 MED ORDER — SODIUM CHLORIDE 0.9 % IV SOLN: INTRAVENOUS | Status: DC

## 2023-06-11 MED ORDER — LACTATED RINGERS IV BOLUS
1000.0000 mL | Freq: Once | INTRAVENOUS | Status: AC
  Administered 2023-06-11: 1000 mL via INTRAVENOUS

## 2023-06-11 MED ORDER — HYDROMORPHONE HCL 1 MG/ML IJ SOLN
0.5000 mg | INTRAMUSCULAR | Status: DC | PRN
  Administered 2023-06-11: 0.5 mg via INTRAVENOUS
  Administered 2023-06-11 – 2023-06-13 (×10): 1 mg via INTRAVENOUS
  Filled 2023-06-11 (×11): qty 1

## 2023-06-11 MED ORDER — ACETAMINOPHEN 650 MG RE SUPP: 650.0000 mg | Freq: Four times a day (QID) | RECTAL | Status: DC | PRN

## 2023-06-11 MED ORDER — ONDANSETRON HCL 4 MG/2ML IJ SOLN
4.0000 mg | Freq: Once | INTRAMUSCULAR | Status: AC
  Administered 2023-06-11: 4 mg via INTRAVENOUS
  Filled 2023-06-11: qty 2

## 2023-06-11 NOTE — Assessment & Plan Note (Addendum)
AST/ALT mildly elevated  CT unremarkable liver imaging Possibly secondary to intractable N/V or gastroenteritis or Dificid  IVF Trend  If continue to trend upwards would work up more extensively

## 2023-06-11 NOTE — ED Notes (Signed)
Pt worried about fecal incontinence. Diaper placed on pt.

## 2023-06-11 NOTE — ED Notes (Signed)
Called [facility] inpatient RN and she is ready for patient

## 2023-06-11 NOTE — ED Triage Notes (Addendum)
Patient endorses n/v x 6 weeks, and abdominal pain since May.  Patient's husband reports "patient hasn't been able to eat a meal in a month."  Patient reports it's been getting progressively worse over the past month.  Patient seen at [facility] recently for same. Patient's husband reports patient has also been hallucinating today.

## 2023-06-11 NOTE — Assessment & Plan Note (Addendum)
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  -liquid diet, advance as tolerated  -PPI therapy  -unsure of etiology,. If no improvement consider GI consult

## 2023-06-11 NOTE — Assessment & Plan Note (Addendum)
S/p 10 days of treatment with Dificid (7/26-8/5) She states stools returned to normal Have been more loose over past several days Continue to monitor

## 2023-06-11 NOTE — H&P (Addendum)
History and Physical    Patient: Miranda Perkins:[id number] DOB: [birth year] DOA: 06/11/2023 DOS: the patient was seen and examined on 06/11/2023 PCP: [name], DO  Patient coming from: Home - lives with her husband. Ambulates independently    Chief Complaint: intractable nausea and vomiting   HPI: Miranda Perkins is a 61 y.o. 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  She states she has had stomach pain since she had a hemorrhagic adrenal gland in May. She had C.Diff 2 weeks ago and has been nauseated with poor PO intake since that time.  She completed 10 days of treatment for C.diff  She has not really eaten much in the past 2 weeks due to vomiting.  She can not keep anything down and if she eats she vomits.  She can sip liquids, but is having a hard time even keeping liquids. She has pain in her epigastric area that is constant and worse with vomiting.  She also has dry heaves. She states her emesis is very acidic and green/yellow in color.    No one else has been sick with GI symptoms, no foreign travel   Denies any fever/chills, vision changes/headaches, chest pain or palpitations, shortness of breath or cough, dysuria or leg swelling.   She has past history of smoking, stopped in 2011 No alcohol.  ER Course:  vitals: afebrile, bp: 142/98, HR; 112, RR: 18, oxygen: 96%RA Pertinent labs: wbc: 12.6, potassium: 3.1, albumin: 2.9, AST: 43, ALT: 94, lipase: 74, UA: 20 ketones,  CT abdomen/pelvis: no acute findings.  No definite acute findings are noted in the abdomen or pelvis. 2. Large right adrenal metastasis redemonstrated, slightly increased in size compared to prior studies, most compatible with a metastatic lesion. 3. Trace right pleural effusion lying  dependently. 4. Aortic atherosclerosis. In ED: given 1L IVF bolus, potassium and pain medication. [facility] asked to admit.    Review of Systems: As mentioned in the history of present illness. All other systems reviewed and are negative. Past Medical History:  Diagnosis Date   Adrenal hemorrhage (HCC)    Anemia    Arthritis    Cancer (HCC)    skin cancer - basal   Edema of both lower extremities    Endometrial polyp    Hirsutism    History of 2019 novel coronavirus disease (COVID-19) 12/21/2020   positive home result documented in pcp note in epic 12-23-2020,  residual doe, pulmonology consult w/ dr wert note in epic 03-20-2021   History of basal cell carcinoma (BCC) excision 2016   forehead, per pt no recurrence   History of CVA (cerebrovascular accident) without residual deficits 1993   pt stated , while on Chloramphenicol, no residuaL; [medication given to treat tick bite] ; patient states  "i stroked out right next to my doctor , he said i passed out and that was my only symptom" ;  no resiudal   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 branch,  work-up results in epic 03/ 2022 (normal nuclear stress test, normal echo, no arrhythmia's per event monitor);   (05-28-2021 per pt no  symptoms since 03/ 2022)   Multiple thyroid nodules    endocrinologist--- dr g. nida,  hx benign bx, clinically euthyroid   Osteopenia 12/2017   T score -1.2 FRAX 4.9% / 0.3%   PMB (postmenopausal bleeding)    Pneumonia    x 2   Post-COVID chronic dyspnea    Rosacea    Stroke (HCC)    Thickened endometrium    Past Surgical History:  Procedure Laterality Date   ANTERIOR AND POSTERIOR REPAIR N/A 08/21/2020   Procedure: ANTERIOR (CYSTOCELE) AND POSTERIOR REPAIR (RECTOCELE);  Surgeon: Genia Del, MD;  Location: [facility];  Service:  Gynecology;  Laterality: N/A;  requesting 9:00am OR time  requests one hour   ANTERIOR CERVICAL DECOMP/DISCECTOMY FUSION N/A 09/27/2017   Procedure: ANTERIOR CERVICAL DECOMPRESSION/DISCECTOMY FUSION CERVICAL FOUR-FIVE ,CERVICAL FIVE-SIX,CERVICAL SIX-SEVEN;  Surgeon: Donalee Citrin, MD;  Location[facility] OR;  Service: Neurosurgery;  Laterality: N/A;   CARPAL TUNNEL RELEASE Left 09/27/2017   Procedure: CARPAL TUNNEL RELEASE;  Surgeon: Donalee Citrin, MD;  Location: [facility] OR;  Service: Neurosurgery;  Laterality: Left;   DILATATION & CURETTAGE/HYSTEROSCOPY WITH MYOSURE N/A 06/03/2021   Procedure: DILATATION & CURETTAGE/HYSTEROSCOPY WITH MYOSURE;  Surgeon: Genia Del, MD;  Location: [facility] [place];  Service: Gynecology;  Laterality: N/A;   FOOT SURGERY     bone removed from pinky toe   INGUINAL HERNIA REPAIR Right 1995   KNEE ARTHROSCOPY W/ MENISCAL REPAIR Left 06/21/2017   dr Netta Corrigan   LEG SURGERY  1972   MOHS SURGERY  2016   BCC of forehead   MOUTH SURGERY  2015   removal and cauterization pallete of cancerous lesion   TONSILLECTOMY  1979   TONSILLECTOMY  1977   TOTAL KNEE ARTHROPLASTY Left 01/26/2018   Procedure: LEFT TOTAL KNEE ARTHROPLASTY;  Surgeon: Ranee Gosselin, MD;  Location: [facility];  Service: Orthopedics;  Laterality: Left;   TUBAL LIGATION     Social History:  reports that she quit smoking about 13 years ago. Her smoking use included cigarettes. She started smoking about 48 years ago. She has a 17.5 pack-year smoking history. She has never used smokeless tobacco. She reports that she does not currently use alcohol. She reports that she does not use drugs.  Allergies  Allergen Reactions   Influenza Vaccines Anaphylaxis and Other (See Comments)    Per patient   Penicillins Anaphylaxis and Other (See Comments)   Articaine Other (See Comments)    Caused infection   Cortisone Other (See Comments)    Turned red and ran a low grade fever for 3 days   Other Rash    bandaids-  skin turns red and a rash     Family History  Problem Relation Age of Onset   Diabetes Mother    Heart Problems Mother    Rheum arthritis Mother    COPD Mother    Polycystic kidney disease Mother    Carpal tunnel syndrome Mother    Cancer Father        Liver Cancer   Liver cancer Father    Carpal tunnel syndrome Sister    Thyroid disease Sister    Rheum arthritis Sister    Lung disease Sister    Diabetes Sister    Carpal tunnel syndrome Sister    Clotting disorder Sister    Rheum arthritis Sister    Thyroid disease Sister    COPD Sister    Heart Problems Sister    Gout Sister    Cancer Brother  Heart Problems Maternal Grandmother    Cancer Maternal Grandfather        Lung Cancer   Heart Problems Maternal Grandfather    Heart Problems Paternal Grandmother    Cancer Paternal Grandfather        Brain & Skin cancer   Polycystic ovary syndrome Daughter    Rheum arthritis Maternal Aunt    Fibromyalgia Maternal Aunt    Polycystic kidney disease Maternal Aunt    Heart Problems Maternal Aunt    Anemia Maternal Aunt    Adrenal disorder Maternal Aunt    Carpal tunnel syndrome Maternal Aunt    Diabetes Maternal Aunt    Thyroid disease Maternal Aunt    Rheum arthritis Maternal Aunt    Diabetes Maternal Aunt    Diabetes Maternal Aunt    Rheum arthritis Maternal Aunt    Heart Problems Maternal Aunt    Rheum arthritis Maternal Uncle    Heart Problems Maternal Uncle     Prior to Admission medications   Medication Sig Start Date End Date Taking? Authorizing Provider  albuterol (VENTOLIN HFA) 108 (90 Base) MCG/ACT inhaler Inhale 2 puffs into the lungs every 6 (six) hours as needed. 04/15/23  Yes Daphine Deutscher, Mary-Margaret, FNP  furosemide (LASIX) 40 MG tablet Take 1.5 tablets (60 mg total) by mouth daily. 08/24/22  Yes Branch, [name], MD  HYDROmorphone (DILAUDID) 2 MG tablet Take 1 tablet (2 mg total) by mouth every 3 (three) hours as needed for severe pain. 05/26/23  Yes  [name], MD  ondansetron (ZOFRAN) 8 MG tablet Take 1 tablet (8 mg total) by mouth every 8 (eight) hours as needed for nausea or vomiting. 05/26/23  Yes [name], MD  promethazine (PHENERGAN) 25 MG suppository Place 1 suppository (25 mg total) rectally every 6 (six) hours as needed for nausea or vomiting. 06/10/23  Yes [name], MD  promethazine (PHENERGAN) 25 MG tablet Take 1 tablet (25 mg total) by mouth every 6 (six) hours as needed for nausea or vomiting. 06/09/23  Yes [name], MD  progesterone (PROMETRIUM) 200 MG capsule Nightly 10 days every 3 months Patient not taking: Reported on 06/11/2023 05/19/23   Lazaro Arms, MD    Physical Exam: Vitals:   06/11/23 1000 06/11/23 1026 06/11/23 1105 06/11/23 1619  BP: 136/70  126/63 125/74  Pulse: 85  88 90  Resp: 17  16   Temp:  98.3 F (36.8 C) 99.2 F (37.3 C) 98.5 F (36.9 C)  TempSrc:  Oral Oral Oral  SpO2: 99%  95% 96%  Weight:      Height:       General:  Appears calm and comfortable and is in NAD Eyes:  PERRL, EOMI, normal lids, iris ENT:  grossly normal hearing, lips & tongue, mmm; appropriate dentition Neck:  no LAD, masses or thyromegaly; no carotid bruits Cardiovascular:  RRR, no m/r/g. No LE edema.  Respiratory:   CTA bilaterally with no wheezes/rales/rhonchi.  Normal respiratory effort. Abdomen:  soft, NT, ND, NABS Back:   normal alignment, no CVAT Skin:  no rash or induration seen on limited exam Musculoskeletal:  grossly normal tone BUE/BLE, good ROM, no bony abnormality Lower extremity:  No LE edema.  Limited foot exam with no ulcerations.  2+ distal pulses. Psychiatric:  grossly normal mood and affect, speech fluent and appropriate, AOx3 Neurologic:  CN 2-12 grossly intact, moves all extremities in coordinated fashion, sensation intact   Radiological Exams on Admission: Independently reviewed - see discussion in A/P  where applicable  CT ABDOMEN PELVIS W  CONTRAST  Result Date: 06/11/2023 CLINICAL DATA:  62 year old female with history of abdominal pain. Nausea and vomiting for the past 6 weeks. Additional history of lung cancer. * Tracking Code: BO * EXAM: CT ABDOMEN AND PELVIS WITH CONTRAST TECHNIQUE: Multidetector CT imaging of the abdomen and pelvis was performed using the standard protocol following bolus administration of intravenous contrast. RADIATION DOSE REDUCTION: This exam was performed according to the departmental dose-optimization program which includes automated exposure control, adjustment of the mA and/or kV according to patient size and/or use of iterative reconstruction technique. CONTRAST:  75mL OMNIPAQUE IOHEXOL 350 MG/ML SOLN COMPARISON:  PET-CT 05/27/2023. CT of the abdomen and pelvis 05/20/2023. FINDINGS: Lower chest: Trace right pleural effusion lying dependently. Hepatobiliary: No definite suspicious cystic or solid hepatic lesions. No intra or extrahepatic biliary ductal dilatation. Gallbladder is unremarkable in appearance. Pancreas: No pancreatic mass. No pancreatic ductal dilatation. No pancreatic or peripancreatic fluid collections or inflammatory changes. Spleen: Unremarkable. Adrenals/Urinary Tract: Large heterogeneous appearing mixed cystic and solid right adrenal mass (axial image 25 of series 3 and coronal image 64 of series 6) which currently measures 10.6 x 7.2 x 14.6 cm, which exerts mass effect upon adjacent structures displacing the right lobe of the liver anteriorly and superiorly, and making broad contact with the upper pole of the right kidney which is mildly distorted by the adrenal mass. Left kidney and left adrenal gland are normal in appearance. No hydroureteronephrosis. Urinary bladder is unremarkable in appearance. Stomach/Bowel: The appearance of the stomach is normal. There is no pathologic dilatation of small bowel or colon. Numerous colonic diverticuli are noted, without surrounding inflammatory changes to  suggest an acute diverticulitis at this time. The appendix is not confidently identified and may be surgically absent. Regardless, there are no inflammatory changes noted adjacent to the cecum to suggest the presence of an acute appendicitis at this time. Vascular/Lymphatic: Atherosclerosis in the abdominal aorta and pelvic vasculature. No lymphadenopathy noted in the abdomen or pelvis. Reproductive: Tubal ligation clips are noted bilaterally. Uterus and ovaries are otherwise unremarkable in appearance. Other: No significant volume of ascites.  No pneumoperitoneum. Musculoskeletal: There are no aggressive appearing lytic or blastic lesions noted in the visualized portions of the skeleton. IMPRESSION: 1. No definite acute findings are noted in the abdomen or pelvis. 2. Large right adrenal metastasis redemonstrated, slightly increased in size compared to prior studies, most compatible with a metastatic lesion. 3. Trace right pleural effusion lying dependently. 4. Aortic atherosclerosis. 5. Additional incidental findings, as above. Electronically Signed   By: Trudie Reed M.D.   On: 06/11/2023 05:20    EKG: Independently reviewed.  Sinus tachycardia with rate 100; nonspecific ST changes with no evidence of acute ischemia   Labs on Admission: I have personally reviewed the available labs and imaging studies at the time of the admission.  Pertinent labs:   wbc: 12.6,  potassium: 3.1,  albumin: 2.9,  AST: 43,  ALT: 94,  lipase: 74,  UA: 20 ketones,   Assessment and Plan: Principal Problem:   Intractable nausea and vomiting Active Problems:   Hypokalemia   Transaminitis   recent history of C. 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  -liquid diet, advance as tolerated  -PPI therapy  -unsure of etiology,. If no improvement consider GI consult   Hypokalemia Secondary to exogenous losses Magnesium wnl Repleted in ED Trend   Transaminitis AST/ALT mildly elevated  CT unremarkable liver imaging Possibly secondary to intractable N/V or gastroenteritis or Dificid  IVF Trend  If continue to trend upwards would work up more extensively    recent history of C. difficile colitis S/p 10 days of treatment with Dificid (7/26-8/5) She states stools returned to normal Have been more loose over past several days Continue to monitor   Malignant neoplasm of unspecified part of unspecified bronchus or lung (HCC) Recently diagnosed in July with plans for bronchoscopy with biopsy on Monday, 8/12 by Dr. 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   PET scan done 7/25 1. Intensely hypermetabolic solid 5.7 cm posterior left perihilar lung mass, compatible with primary bronchogenic malignancy. 2. 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 3. Mildly hypermetabolic aortocaval retroperitoneal adenopathy, suspicious for nodal metastasis. 4. Intensely hypermetabolic heterogeneous hypodense 1.7 cm right thyroid nodule, suspicious for thyroid malignancy. 5)  Hypermetabolic superficial subcutaneous 0.7 cm soft tissue lesion in the medial proximal right upper extremity, nonspecific, cannot exclude soft tissue metastasis.  -thyroid US 7/9 with no evidence of malignancy and stable node size  -oncology to see s/p biopsy  -pain control with oral  dilaudid>change to IV due to intractable N/V -palliative care consult for pain management   Thyroid nodule PET scan showed: [name] Intensely hypermetabolic heterogeneous hypodense 1.7 cm right thyroid nodule, suspicious for thyroid malignancy. Thyroid US 05/11/23: Right thyroid nodules are unchanged in size since 03/26/2016, consistent with benign etiology.      Upper airway cough syndrome vs cough variant asthma UACS vs cough varian asthma vs long covid as onset after covid  Continue PPI  Allergy profile done Has SABA, but hasn't needed will continue PRN here   Prediabetes A1C 5.9 in 07/2022      Advance Care Planning:   Code Status: Full Code   Consults: palliative care   DVT Prophylaxis: SCDs  Family Communication: husband at bedside   Severity of Illness: The appropriate patient status for this patient is OBSERVATION. Observation status is judged to be reasonable and necessary in order to provide the required intensity of service to ensure the patient's safety. The patient's presenting symptoms, physical exam findings, and initial radiographic and laboratory data in the context of their medical condition is felt to place them at decreased risk for further clinical deterioration. Furthermore, it is anticipated that the patient will be medically stable for discharge from the hospital within 2 midnights of admission.   Author: Orland Mustard, MD 06/11/2023 6:40 PM  For on call review [email or web address].

## 2023-06-11 NOTE — Plan of Care (Signed)

## 2023-06-11 NOTE — Assessment & Plan Note (Signed)
Recently diagnosed in July with plans for bronchoscopy with biopsy on Monday, 8/12 by Dr. 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   PET scan done 7/25 1. Intensely hypermetabolic solid 5.7 cm posterior left perihilar lung mass, compatible with primary bronchogenic malignancy. 2. 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 3. Mildly hypermetabolic aortocaval retroperitoneal adenopathy, suspicious for nodal metastasis. 4. Intensely hypermetabolic heterogeneous hypodense 1.7 cm right thyroid nodule, suspicious for thyroid malignancy. 5)  Hypermetabolic superficial subcutaneous 0.7 cm soft tissue lesion in the medial proximal right upper extremity, nonspecific, cannot exclude soft tissue metastasis.  -thyroid US 7/9 with no evidence of malignancy and stable node size  -oncology to see s/p biopsy  -pain control with oral dilaudid>change to IV due to intractable N/V -palliative care consult for pain management

## 2023-06-11 NOTE — Progress Notes (Signed)
Patient arrived to unit from [facility] ED. AO x 4. VSS. Patient given gown along with toiletries. Educated on call bell, safety and hospital bed. Pain and N/V addressed, see MAR.  Patient has no concerns at this time. Call bell within reach. Continue to monitor N/V.

## 2023-06-11 NOTE — Consult Note (Signed)
Consultation Note Date: 06/11/2023   Patient Name: Miranda Perkins  DOB: 07/29/1961  MRN: [id number]  Age / Sex: 62 y.o., female  PCP: [name], DO Referring Physician: Orland Mustard, MD  Reason for Consultation: Pain control  HPI/Patient Profile: 62 y.o. female with past medical history of adrenal hemorrhage, CVA, thyroid nodules, recent C. 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   Patient recently completed 10 days of treatment for C.diff. She has not really eaten much in the past 2 weeks due to vomiting. She has been unable to keep her PO nausea and pain medication down at home. PMT has been consulted to assist with pain management.  Clinical Assessment and Goals of Care:  I have reviewed medical records including EPIC notes, labs and imaging, assessed the patient and then met at the bedside with patient and her husband [name] to discuss diagnosis prognosis, GOC, EOL wishes, disposition and options.  I introduced Palliative Medicine as specialized medical care for people living with serious illness. It focuses on providing relief from the symptoms and stress of a serious illness. The goal is to improve quality of life for both the patient and the family.  We discussed a brief life review of the patient and then focused on their current illness.   I attempted to elicit values and goals of care important to the patient.    Medical History Review and Understanding:  Reviewed patient's health and medical issues since 5/25 including adrenal hemorrhage, diagnostics that have been completed and are still to be done (biopsy on Monday).   Social History: Patient is married and has 2 grandsons at home that she has cared for since her daughter's death in 55. 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  She previously enjoyed traveling, gardening.  Functional and Nutritional State: Patient has had poor oral intake for 3 to 4 weeks.  She was previously cooking and cleaning but has been unable lately.  Palliative Symptoms: Nausea and vomiting Abdominal pain  Advance Directives: A detailed discussion regarding advanced directives was had. Recommended completion with notary during admission. Patient has not completed any AD documentation yet.   Discussion: Patient shares that her abdominal pain has improved since receiving as needed IV Dilaudid.  Her pain was an 8 out of 10 in the ED and is now currently 2 out of 10.  She was taking 2 mg Dilaudid as needed at home until she was unable to keep it down without vomiting.  She has previously tried oxycodone and Tylenol/codeine but that was ineffective. 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 For example, her husband has severe CHF that has improved greatly.  Patient has also lost her mother and aunt within the past year. They are open to completed advance directives and were in the process of doing this at home.  We discussed the process of monitoring her medication usage and finding an effective dose to continue orally. Provided reassurance on the role of palliative medicine as an extra role of support alongside curative treatment efforts. Patient and her  husband are appreciative. She understands the plan to continue IV medication as needed to guide further decision-making and need for adjustments moving forward.    Discussed the importance of continued conversation with family and the medical  providers regarding overall plan of care and treatment options, ensuring decisions are within the context of the patient's values and GOCs.   Questions and concerns were addressed.  Hard Choices booklet left for review. The family was encouraged to call with questions or concerns.  PMT will continue to support holistically.   SUMMARY OF RECOMMENDATIONS   -Continue full code/full scope treatment -Pain management: Continue Dilaudid 0.5-1mg  IV Q2H PRN. Patient educated on process of conversion to an effective oral regimen when nausea has improved. Patient may also benefit from Fentanyl patch -Psychosocial and emotional support provided -Goal is for improvement of symptoms and proceed with biopsy to guide further treatment options -Psychosocial and emotional support provided -PMT will continue to follow and support  Prognosis:  Unable to determine  Discharge Planning: To Be Determined      Primary Diagnoses: Present on Admission:  (Resolved) Intractable pain  (Resolved) Depression  (Resolved) Mixed hyperlipidemia  Upper airway cough syndrome vs cough variant asthma  Prediabetes  Malignant neoplasm of unspecified part of unspecified bronchus or lung (HCC)   Physical Exam Vitals and nursing note reviewed.  Constitutional:      General: She is not in acute distress.    Appearance: She is ill-appearing.  Cardiovascular:     Rate and Rhythm: Normal rate.  Pulmonary:     Effort: Pulmonary effort is normal. No respiratory distress.  Neurological:     Mental Status: She is alert and oriented to person, place, and time.  Psychiatric:        Mood and Affect: Mood normal.        Behavior: Behavior normal.    Vital Signs: BP 126/63 (BP Location: Left Arm)   Pulse 88   Temp 99.2 F (37.3 C) (Oral)   Resp 16   Ht 5\' 6"  (1.676 m)   Wt 102.1 kg   SpO2 95%   BMI 36.32 kg/m  Pain Scale: 0-10   Pain Score: 3    SpO2: SpO2: 95 % O2 Device:SpO2: 95 % O2 Flow Rate: .     Palliative Assessment/Data: TBD     MDM: High    Jeni Salles, PA-C  Palliative Medicine Team Team phone # [phone]  Thank you for allowing the Palliative Medicine Team to assist in the care of this patient. Please utilize secure chat with additional questions, if there is no response within 30 minutes please call the above phone number.  Palliative Medicine Team providers are available by phone from 7am to 7pm daily and can be reached through the team cell phone.  Should this patient require assistance outside of these hours, please call the patient's attending physician.

## 2023-06-11 NOTE — ED Notes (Signed)
Pt to CT

## 2023-06-11 NOTE — Assessment & Plan Note (Signed)
UACS vs cough varian asthma vs long covid as onset after covid  Continue PPI  Allergy profile done Has SABA, but hasn't needed will continue PRN here

## 2023-06-11 NOTE — ED Notes (Signed)
Pt back to room.

## 2023-06-11 NOTE — ED Provider Notes (Signed)
[place] EMERGENCY DEPARTMENT AT [facility] Provider Note   CSN: [id number] Arrival date & time: 06/11/23  0256     History  Chief Complaint  Patient presents with   Abdominal Pain    MODELLE MCARTHUR is a 62 y.o. female history of adrenal hemorrhage, CVA, thyroid nodules presents today for evaluation of abdominal pain.  Patient stated her abdominal pain started around 2 weeks ago.  Pain is in her upper abdomen, constant, sometimes rating down to her lower abdomen.  She endorses nausea and vomiting.  Stated that she has been having some diarrhea with some blood in her stool.  She reports that she has not had much p.o. intake because of the nausea and pain.  Patient sees oncology at [facility] and has not been started on chemotherapy or any type of cancer treatment.   Abdominal Pain     Past Medical History:  Diagnosis Date   Adrenal hemorrhage (HCC)    Anemia    Arthritis    Cancer (HCC)    skin cancer - basal   Edema of both lower extremities    Endometrial polyp    Hirsutism    History of 2019 novel coronavirus disease (COVID-19) 12/21/2020   positive home result documented in pcp note in epic 12-23-2020,  residual doe, pulmonology consult w/ dr wert note in epic 03-20-2021   History of basal cell carcinoma (BCC) excision 2016   forehead, per pt no recurrence   History of CVA (cerebrovascular accident) without residual deficits 1993   pt stated , while on Chloramphenicol, no residuaL; [medication given to treat tick bite] ; patient states  "i stroked out right next to my doctor , he said i passed out and that was my only symptom" ;  no resiudal   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 branch,  work-up results in epic 03/ 2022 (normal nuclear stress test, normal echo, no arrhythmia's per event monitor);    (05-28-2021 per pt no symptoms since 03/ 2022)   Multiple thyroid nodules    endocrinologist--- dr g. nida,  hx benign bx, clinically euthyroid   Osteopenia 12/2017   T score -1.2 FRAX 4.9% / 0.3%   PMB (postmenopausal bleeding)    Pneumonia    x 2   Post-COVID chronic dyspnea    Rosacea    Stroke (HCC)    Thickened endometrium    Past Surgical History:  Procedure Laterality Date   ANTERIOR AND POSTERIOR REPAIR N/A 08/21/2020   Procedure: ANTERIOR (CYSTOCELE) AND POSTERIOR REPAIR (RECTOCELE);  Surgeon: Genia Del, MD;  Location: [facility];  Service: Gynecology;  Laterality: N/A;  requesting 9:00am OR time  requests one hour   ANTERIOR CERVICAL DECOMP/DISCECTOMY FUSION N/A 09/27/2017   Procedure: ANTERIOR CERVICAL DECOMPRESSION/DISCECTOMY FUSION CERVICAL FOUR-FIVE ,CERVICAL FIVE-SIX,CERVICAL SIX-SEVEN;  Surgeon: Donalee Citrin, MD;  Location: [facility] OR;  Service: Neurosurgery;  Laterality: N/A;   CARPAL TUNNEL RELEASE Left 09/27/2017   Procedure: CARPAL TUNNEL RELEASE;  Surgeon: Donalee Citrin, MD;  Location: [facility] OR;  Service: Neurosurgery;  Laterality: Left;   DILATATION & CURETTAGE/HYSTEROSCOPY WITH MYOSURE N/A 06/03/2021   Procedure: DILATATION & CURETTAGE/HYSTEROSCOPY WITH MYOSURE;  Surgeon: Genia Del, MD;  Location: [facility];  Service: Gynecology;  Laterality: N/A;   FOOT SURGERY  bone removed from pinky toe   INGUINAL HERNIA REPAIR Right 1995   KNEE ARTHROSCOPY W/ MENISCAL REPAIR Left 06/21/2017   dr Netta Corrigan   LEG SURGERY  1972   MOHS SURGERY  2016   BCC of forehead   MOUTH SURGERY  2015   removal and cauterization pallete of cancerous lesion   TONSILLECTOMY  1979   TONSILLECTOMY  1977   TOTAL KNEE ARTHROPLASTY Left 01/26/2018   Procedure: LEFT TOTAL KNEE ARTHROPLASTY;  Surgeon: Ranee Gosselin, MD;  Location: [facility];  Service: Orthopedics;  Laterality: Left;   TUBAL LIGATION       Home Medications Prior to Admission  medications   Medication Sig Start Date End Date Taking? Authorizing Provider  acetaminophen (TYLENOL) 325 MG tablet Take 2 tablets (650 mg total) by mouth in the morning, at noon, and at bedtime. 05/21/23   Johnson, Clanford L, MD  albuterol (VENTOLIN HFA) 108 (90 Base) MCG/ACT inhaler Inhale 2 puffs into the lungs every 6 (six) hours as needed. 04/15/23   Daphine Deutscher Mary-Margaret, FNP  furosemide (LASIX) 40 MG tablet Take 1.5 tablets (60 mg total) by mouth daily. 08/24/22   Antoine Poche, MD  HYDROmorphone (DILAUDID) 2 MG tablet Take 1 tablet (2 mg total) by mouth every 3 (three) hours as needed for severe pain. 05/26/23   [name], MD  ondansetron (ZOFRAN) 8 MG tablet Take 1 tablet (8 mg total) by mouth every 8 (eight) hours as needed for nausea or vomiting. 05/26/23   [name], MD  progesterone (PROMETRIUM) 200 MG capsule Nightly 10 days every 3 months 05/19/23   Lazaro Arms, MD  promethazine (PHENERGAN) 25 MG suppository Place 1 suppository (25 mg total) rectally every 6 (six) hours as needed for nausea or vomiting. 06/10/23   [name], MD  promethazine (PHENERGAN) 25 MG tablet Take 1 tablet (25 mg total) by mouth every 6 (six) hours as needed for nausea or vomiting. 06/09/23   [name], MD      Allergies    Influenza vaccines, Penicillins, Articaine, Cortisone, and Other    Review of Systems   Review of Systems  Gastrointestinal:  Positive for abdominal pain.    Physical Exam Updated Vital Signs BP (!) 141/86   Pulse 98   Temp 98.3 F (36.8 C) (Oral)   Resp 17   Ht 5\' 6"  (1.676 m)   Wt 102.1 kg   SpO2 100%   BMI 36.32 kg/m  Physical Exam Vitals and nursing note reviewed.  Constitutional:      Appearance: Normal appearance.  HENT:     Head: Normocephalic and atraumatic.     Mouth/Throat:     Mouth: Mucous membranes are moist.  Eyes:     General: No scleral icterus. Cardiovascular:     Rate and Rhythm: Normal rate and regular  rhythm.     Pulses: Normal pulses.     Heart sounds: Normal heart sounds.  Pulmonary:     Effort: Pulmonary effort is normal.     Breath sounds: Normal breath sounds.  Abdominal:     General: Abdomen is flat.     Palpations: Abdomen is soft.     Tenderness: There is abdominal tenderness in the right upper quadrant, epigastric area and left upper quadrant.  Musculoskeletal:        General: No deformity.  Skin:    General: Skin is warm.     Findings: No rash.  Neurological:     General: No focal deficit present.  Mental Status: She is alert.  Psychiatric:        Mood and Affect: Mood normal.     ED Results / Procedures / Treatments   Labs (all labs ordered are listed, but only abnormal results are displayed) Labs Reviewed  LIPASE, BLOOD - Abnormal; Notable for the following components:      Result Value   Lipase 74 (*)    All other components within normal limits  COMPREHENSIVE METABOLIC PANEL - Abnormal; Notable for the following components:   Potassium 3.1 (*)    Chloride 94 (*)    Glucose, Bld 124 (*)    Albumin 2.9 (*)    AST 43 (*)    ALT 94 (*)    Anion gap 16 (*)    All other components within normal limits  CBC - Abnormal; Notable for the following components:   WBC 12.6 (*)    Platelets 467 (*)    All other components within normal limits  URINALYSIS, ROUTINE W REFLEX MICROSCOPIC    EKG None  Radiology No results found.  Procedures Procedures    Medications Ordered in ED Medications  HYDROmorphone (DILAUDID) injection 1 mg (has no administration in time range)  ondansetron (ZOFRAN) injection 4 mg (has no administration in time range)    ED Course/ Medical Decision Making/ A&P                                 Medical Decision Making Amount and/or Complexity of Data Reviewed Labs: ordered. Radiology: ordered. ECG/medicine tests: ordered.  Risk Prescription drug management.   This patient presents to the ED for abdominal pain, nausea,  vomiting, this involves an extensive number of treatment options, and is a complaint that carries with a high risk of complications and morbidity.  The differential diagnosis includes SBO, gastroenteritis, colitis, appendicitis, cholecystitis, cholelithiasis, diverticulitis, diverticulosis, malignancy, abscess, cyst, UTI, pyelonephritis, infectious etiology.  This is not an exhaustive list.  Lab tests: I ordered and personally interpreted labs.  The pertinent results include: WBC 12.6. Hbg unremarkable. Platelets unremarkable. Electrolytes significant for potassium 3.1.. BUN, creatinine unremarkable.  Lipase 74.  Imaging studies: I ordered imaging studies, personally reviewed, interpreted imaging and agree with the radiologist's interpretations. The results include: CT abdomen pelvis showed a large right adrenal metastasis and right pleural effusion.   Problem list/ ED course/ Critical interventions/ Medical management: HPI: See above Vital signs within normal range and stable throughout visit. Laboratory/imaging studies significant for: See above. On physical examination, patient is afebrile and appears in no acute distress.  There was tenderness to palpation to right lower quadrant and right upper quadrant.  CT abdomen pelvis showed a large right adrenal metastasis and right pleural effusion.  Based on CT scan results, I have low suspicion for other dangerous etiology of abdominal pain to include SBO, appendicitis, diverticulitis, pyelo etc.  Given Dilaudid x 2 for pain and Zofran for nausea.  Reevaluation of patient after these medications showed that symptoms remain the same.  I think patient will benefit from mission for pain control and intractable vomiting.  Will consult medicine for admission. I have reviewed the patient home medicines and have made adjustments as needed.  Cardiac monitoring/EKG: The patient was maintained on a cardiac monitor.  I personally reviewed and interpreted the  cardiac monitor which showed an underlying rhythm of: sinus rhythm.  Additional history obtained: External records from outside source obtained and reviewed including: Chart  review including previous notes, labs, imaging.  Consultations obtained: I spoke to Dr. Aurther Loft [facility] hospitalist.  He agreed to admit the patient.  Disposition Admit.  This chart was dictated using voice recognition software.  Despite best efforts to proofread,  errors can occur which can change the documentation meaning.          Final Clinical Impression(s) / ED Diagnoses Final diagnoses:  Pain of upper abdomen  Nausea and vomiting, unspecified vomiting type    Rx / DC Orders ED Discharge Orders     None         Jeanelle Malling, PA 06/11/23 [id number]    Zadie Rhine, MD 06/11/23 [phone]

## 2023-06-11 NOTE — Assessment & Plan Note (Addendum)
PET scan showed: [name] Intensely hypermetabolic heterogeneous hypodense 1.7 cm right thyroid nodule, suspicious for thyroid malignancy. Thyroid US 05/11/23: Right thyroid nodules are unchanged in size since 03/26/2016, consistent with benign etiology.

## 2023-06-11 NOTE — ED Notes (Signed)
ED TO INPATIENT HANDOFF REPORT  ED Nurse Name and Phone #: Dot Lanes, paramedic [phone]  S Name/Age/Gender Miranda Perkins 62 y.o. female Room/Bed: 034C/034C  Code Status   Code Status: Prior  Home/SNF/Other Home Patient oriented to: self, place, time, and situation Is this baseline? Yes   Triage Complete: Triage complete  Chief Complaint Intractable pain [R52]  Triage Note Patient endorses n/v x 6 weeks, and abdominal pain since May.  Patient's husband reports "patient hasn't been able to eat a meal in a month."  Patient reports it's been getting progressively worse over the past month.  Patient seen at [facility] recently for same. Patient's husband reports patient has also been hallucinating today.    Allergies Allergies  Allergen Reactions   Influenza Vaccines Anaphylaxis and Other (See Comments)    Per patient   Penicillins Anaphylaxis and Other (See Comments)   Articaine Other (See Comments)    Caused infection   Cortisone Other (See Comments)    Turned red and ran a low grade fever for 3 days   Other Rash    bandaids- skin turns red and a rash     Level of Care/Admitting Diagnosis ED Disposition     ED Disposition  Admit   Condition  --   Comment  Hospital Area: [facility] [[id number]]  Level of Care: Med-Surg [16]  May place patient in observation at [facility] or [facility] if equivalent level of care is available:: Yes  Covid Evaluation: Asymptomatic - no recent exposure (last 10 days) testing not required  Diagnosis: Intractable pain [784696]  Admitting Physician: Orland Mustard [[id number]]  Attending Physician: Orland Mustard [[id number]]          B Medical/Surgery History Past Medical History:  Diagnosis Date   Adrenal hemorrhage (HCC)    Anemia    Arthritis    Cancer (HCC)    skin cancer - basal   Edema of both lower extremities    Endometrial polyp    Hirsutism    History of 2019 novel coronavirus disease (COVID-19) 12/21/2020    positive home result documented in pcp note in epic 12-23-2020,  residual doe, pulmonology consult w/ dr wert note in epic 03-20-2021   History of basal cell carcinoma (BCC) excision 2016   forehead, per pt no recurrence   History of CVA (cerebrovascular accident) without residual deficits 1993   pt stated , while on Chloramphenicol, no residuaL; [medication given to treat tick bite] ; patient states  "i stroked out right next to my doctor , he said i passed out and that was my only symptom" ;  no resiudal   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 branch,  work-up results in epic 03/ 2022 (normal nuclear stress test, normal echo, no arrhythmia's per event monitor);   (05-28-2021 per pt no symptoms since 03/ 2022)   Multiple thyroid nodules    endocrinologist--- dr g. nida,  hx benign bx, clinically euthyroid   Osteopenia 12/2017   T score -1.2 FRAX 4.9% / 0.3%   PMB (postmenopausal bleeding)    Pneumonia    x 2   Post-COVID chronic dyspnea    Rosacea    Stroke (HCC)    Thickened endometrium    Past Surgical History:  Procedure Laterality Date   ANTERIOR AND POSTERIOR  REPAIR N/A 08/21/2020   Procedure: ANTERIOR (CYSTOCELE) AND POSTERIOR REPAIR (RECTOCELE);  Surgeon: Genia Del, MD;  Location[facility];  Service: Gynecology;  Laterality: N/A;  requesting 9:00am OR time  requests one hour   ANTERIOR CERVICAL DECOMP/DISCECTOMY FUSION N/A 09/27/2017   Procedure: ANTERIOR CERVICAL DECOMPRESSION/DISCECTOMY FUSION CERVICAL FOUR-FIVE ,CERVICAL FIVE-SIX,CERVICAL SIX-SEVEN;  Surgeon: Donalee Citrin, MD;  Location: [facility] OR;  Service: Neurosurgery;  Laterality: N/A;   CARPAL TUNNEL RELEASE Left 09/27/2017   Procedure: CARPAL TUNNEL RELEASE;  Surgeon: Donalee Citrin, MD;  Location: [facility] OR;  Service: Neurosurgery;  Laterality: Left;    DILATATION & CURETTAGE/HYSTEROSCOPY WITH MYOSURE N/A 06/03/2021   Procedure: DILATATION & CURETTAGE/HYSTEROSCOPY WITH MYOSURE;  Surgeon: Genia Del, MD;  Location: [facility];  Service: Gynecology;  Laterality: N/A;   FOOT SURGERY     bone removed from pinky toe   INGUINAL HERNIA REPAIR Right 1995   KNEE ARTHROSCOPY W/ MENISCAL REPAIR Left 06/21/2017   dr Netta Corrigan   LEG SURGERY  1972   MOHS SURGERY  2016   BCC of forehead   MOUTH SURGERY  2015   removal and cauterization pallete of cancerous lesion   TONSILLECTOMY  1979   TONSILLECTOMY  1977   TOTAL KNEE ARTHROPLASTY Left 01/26/2018   Procedure: LEFT TOTAL KNEE ARTHROPLASTY;  Surgeon: Ranee Gosselin, MD;  Location: [facility] ORS;  Service: Orthopedics;  Laterality: Left;   TUBAL LIGATION       A IV Location/Drains/Wounds Patient Lines/Drains/Airways Status     Active Line/Drains/Airways     Name Placement date Placement time Site Days   Peripheral IV 06/11/23 20 G Right Antecubital 06/11/23  0311  Antecubital  less than 1            Intake/Output Last 24 hours No intake or output data in the 24 hours ending 06/11/23 0936  Labs/Imaging Results for orders placed or performed during the hospital encounter of 06/11/23 (from the past 48 hour(s))  Lipase, blood     Status: Abnormal   Collection Time: 06/11/23  3:13 AM  Result Value Ref Range   Lipase 74 (H) 11 - 51 U/L    Comment: Performed at [facility], [address] [place], [place] [address]  Comprehensive metabolic panel     Status: Abnormal   Collection Time: 06/11/23  3:13 AM  Result Value Ref Range   Sodium 135 135 - 145 mmol/L   Potassium 3.1 (L) 3.5 - 5.1 mmol/L   Chloride 94 (L) 98 - 111 mmol/L   CO2 25 22 - 32 mmol/L   Glucose, Bld 124 (H) 70 - 99 mg/dL    Comment: Glucose reference range applies only to samples taken after fasting for at least 8 hours.   BUN 8 8 - 23 mg/dL   Creatinine, Ser 4.13 0.44 - 1.00 mg/dL   Calcium  9.3 8.9 - 24.4 mg/dL   Total Protein 7.1 6.5 - 8.1 g/dL   Albumin 2.9 (L) 3.5 - 5.0 g/dL   AST 43 (H) 15 - 41 U/L   ALT 94 (H) 0 - 44 U/L   Alkaline Phosphatase 120 38 - 126 U/L   Total Bilirubin 1.2 0.3 - 1.2 mg/dL   GFR, Estimated >01 >02 mL/min    Comment: (NOTE) Calculated using the CKD-EPI Creatinine Equation (2021)    Anion gap 16 (H) 5 - 15    Comment: Performed at [facility], [address] [place], [place] [address]  CBC  Status: Abnormal   Collection Time: 06/11/23  3:13 AM  Result Value Ref Range   WBC 12.6 (H) 4.0 - 10.5 K/uL   RBC 4.60 3.87 - 5.11 MIL/uL   Hemoglobin 12.1 12.0 - 15.0 g/dL   HCT 19.1 47.8 - 29.5 %   MCV 82.4 80.0 - 100.0 fL   MCH 26.3 26.0 - 34.0 pg   MCHC 31.9 30.0 - 36.0 g/dL   RDW 62.1 30.8 - 65.7 %   Platelets 467 (H) 150 - 400 K/uL   nRBC 0.0 0.0 - 0.2 %    Comment: Performed at [facility], [address] [place], [place] [address]  Urinalysis, Routine w reflex microscopic -Urine, Clean Catch     Status: Abnormal   Collection Time: 06/11/23  5:26 AM  Result Value Ref Range   Color, Urine YELLOW YELLOW   APPearance CLEAR CLEAR   Specific Gravity, Urine >1.046 (H) 1.005 - 1.030   pH 7.0 5.0 - 8.0   Glucose, UA NEGATIVE NEGATIVE mg/dL   Hgb urine dipstick NEGATIVE NEGATIVE   Bilirubin Urine NEGATIVE NEGATIVE   Ketones, ur 20 (A) NEGATIVE mg/dL   Protein, ur NEGATIVE NEGATIVE mg/dL   Nitrite NEGATIVE NEGATIVE   Leukocytes,Ua NEGATIVE NEGATIVE    Comment: Performed at [facility], [address], [place], [place] [address]  Magnesium     Status: None   Collection Time: 06/11/23  5:26 AM  Result Value Ref Range   Magnesium 2.1 1.7 - 2.4 mg/dL    Comment: Performed at [facility], [address], [place], [place] [address]   CT ABDOMEN PELVIS W CONTRAST  Result Date: 06/11/2023 CLINICAL DATA:  62 year old female with history of abdominal pain. Nausea and vomiting for the past 6 weeks. Additional history of  lung cancer. * Tracking Code: BO * EXAM: CT ABDOMEN AND PELVIS WITH CONTRAST TECHNIQUE: Multidetector CT imaging of the abdomen and pelvis was performed using the standard protocol following bolus administration of intravenous contrast. RADIATION DOSE REDUCTION: This exam was performed according to the departmental dose-optimization program which includes automated exposure control, adjustment of the mA and/or kV according to patient size and/or use of iterative reconstruction technique. CONTRAST:  75mL OMNIPAQUE IOHEXOL 350 MG/ML SOLN COMPARISON:  PET-CT 05/27/2023. CT of the abdomen and pelvis 05/20/2023. FINDINGS: Lower chest: Trace right pleural effusion lying dependently. Hepatobiliary: No definite suspicious cystic or solid hepatic lesions. No intra or extrahepatic biliary ductal dilatation. Gallbladder is unremarkable in appearance. Pancreas: No pancreatic mass. No pancreatic ductal dilatation. No pancreatic or peripancreatic fluid collections or inflammatory changes. Spleen: Unremarkable. Adrenals/Urinary Tract: Large heterogeneous appearing mixed cystic and solid right adrenal mass (axial image 25 of series 3 and coronal image 64 of series 6) which currently measures 10.6 x 7.2 x 14.6 cm, which exerts mass effect upon adjacent structures displacing the right lobe of the liver anteriorly and superiorly, and making broad contact with the upper pole of the right kidney which is mildly distorted by the adrenal mass. Left kidney and left adrenal gland are normal in appearance. No hydroureteronephrosis. Urinary bladder is unremarkable in appearance. Stomach/Bowel: The appearance of the stomach is normal. There is no pathologic dilatation of small bowel or colon. Numerous colonic diverticuli are noted, without surrounding inflammatory changes to suggest an acute diverticulitis at this time. The appendix is not confidently identified and may be surgically absent. Regardless, there are no inflammatory changes noted  adjacent to the cecum to suggest the presence of  an acute appendicitis at this time. Vascular/Lymphatic: Atherosclerosis in the abdominal aorta and pelvic vasculature. No lymphadenopathy noted in the abdomen or pelvis. Reproductive: Tubal ligation clips are noted bilaterally. Uterus and ovaries are otherwise unremarkable in appearance. Other: No significant volume of ascites.  No pneumoperitoneum. Musculoskeletal: There are no aggressive appearing lytic or blastic lesions noted in the visualized portions of the skeleton. IMPRESSION: 1. No definite acute findings are noted in the abdomen or pelvis. 2. Large right adrenal metastasis redemonstrated, slightly increased in size compared to prior studies, most compatible with a metastatic lesion. 3. Trace right pleural effusion lying dependently. 4. Aortic atherosclerosis. 5. Additional incidental findings, as above. Electronically Signed   By: Trudie Reed M.D.   On: 06/11/2023 05:20    Pending Labs Unresulted Labs (From admission, onward)    None       Vitals/Pain Today's Vitals   06/11/23 0600 06/11/23 0630 06/11/23 0648 06/11/23 0726  BP:  129/79  132/81  Pulse:  97  95  Resp:  15  18  Temp:   98.2 F (36.8 C) 98.2 F (36.8 C)  TempSrc:   Oral Oral  SpO2:  99%  98%  Weight:      Height:      PainSc: 2    3     Isolation Precautions No active isolations  Medications Medications  ondansetron (ZOFRAN) injection 4 mg (has no administration in time range)  promethazine (PHENERGAN) 12.5 mg in sodium chloride 0.9 % 50 mL IVPB (has no administration in time range)  pantoprazole (PROTONIX) injection 40 mg (has no administration in time range)  HYDROmorphone (DILAUDID) injection 1 mg (1 mg Intravenous Given 06/11/23 0414)  ondansetron (ZOFRAN) injection 4 mg (4 mg Intravenous Given 06/11/23 0414)  lactated ringers bolus 1,000 mL (1,000 mLs Intravenous Bolus 06/11/23 0414)  iohexol (OMNIPAQUE) 350 MG/ML injection 75 mL (75 mLs Intravenous  Contrast Given 06/11/23 0442)  potassium chloride 10 mEq in 100 mL IVPB (0 mEq Intravenous Stopped 06/11/23 0845)  HYDROmorphone (DILAUDID) injection 1 mg (1 mg Intravenous Given 06/11/23 0531)    Mobility walks     Focused Assessments    R Recommendations: See Admitting Provider Note  Report given to:   Additional Notes:

## 2023-06-11 NOTE — Assessment & Plan Note (Signed)
A1C 5.9 in 07/2022

## 2023-06-11 NOTE — Assessment & Plan Note (Signed)
Secondary to exogenous losses Magnesium wnl Repleted in ED Trend

## 2023-06-12 DIAGNOSIS — J45991 Cough variant asthma: Secondary | ICD-10-CM | POA: Diagnosis present

## 2023-06-12 DIAGNOSIS — Z515 Encounter for palliative care: Secondary | ICD-10-CM | POA: Diagnosis not present

## 2023-06-12 DIAGNOSIS — I700 Atherosclerosis of aorta: Secondary | ICD-10-CM | POA: Diagnosis present

## 2023-06-12 DIAGNOSIS — E876 Hypokalemia: Secondary | ICD-10-CM | POA: Diagnosis present

## 2023-06-12 DIAGNOSIS — Z85828 Personal history of other malignant neoplasm of skin: Secondary | ICD-10-CM | POA: Diagnosis not present

## 2023-06-12 DIAGNOSIS — Z6836 Body mass index (BMI) 36.0-36.9, adult: Secondary | ICD-10-CM | POA: Diagnosis not present

## 2023-06-12 DIAGNOSIS — R748 Abnormal levels of other serum enzymes: Secondary | ICD-10-CM | POA: Diagnosis present

## 2023-06-12 DIAGNOSIS — E041 Nontoxic single thyroid nodule: Secondary | ICD-10-CM | POA: Diagnosis present

## 2023-06-12 DIAGNOSIS — F32A Depression, unspecified: Secondary | ICD-10-CM | POA: Diagnosis present

## 2023-06-12 DIAGNOSIS — R52 Pain, unspecified: Secondary | ICD-10-CM | POA: Diagnosis present

## 2023-06-12 DIAGNOSIS — Z96652 Presence of left artificial knee joint: Secondary | ICD-10-CM | POA: Diagnosis present

## 2023-06-12 DIAGNOSIS — Z8616 Personal history of COVID-19: Secondary | ICD-10-CM | POA: Diagnosis not present

## 2023-06-12 DIAGNOSIS — K921 Melena: Secondary | ICD-10-CM | POA: Diagnosis present

## 2023-06-12 DIAGNOSIS — Z8673 Personal history of transient ischemic attack (TIA), and cerebral infarction without residual deficits: Secondary | ICD-10-CM | POA: Diagnosis not present

## 2023-06-12 DIAGNOSIS — R1010 Upper abdominal pain, unspecified: Secondary | ICD-10-CM | POA: Diagnosis not present

## 2023-06-12 DIAGNOSIS — Z8619 Personal history of other infectious and parasitic diseases: Secondary | ICD-10-CM | POA: Diagnosis not present

## 2023-06-12 DIAGNOSIS — C3490 Malignant neoplasm of unspecified part of unspecified bronchus or lung: Secondary | ICD-10-CM | POA: Diagnosis not present

## 2023-06-12 DIAGNOSIS — Z87891 Personal history of nicotine dependence: Secondary | ICD-10-CM | POA: Diagnosis not present

## 2023-06-12 DIAGNOSIS — G893 Neoplasm related pain (acute) (chronic): Secondary | ICD-10-CM | POA: Diagnosis present

## 2023-06-12 DIAGNOSIS — R112 Nausea with vomiting, unspecified: Secondary | ICD-10-CM | POA: Diagnosis not present

## 2023-06-12 DIAGNOSIS — R609 Edema, unspecified: Secondary | ICD-10-CM | POA: Diagnosis present

## 2023-06-12 DIAGNOSIS — E782 Mixed hyperlipidemia: Secondary | ICD-10-CM | POA: Diagnosis present

## 2023-06-12 DIAGNOSIS — C3492 Malignant neoplasm of unspecified part of left bronchus or lung: Secondary | ICD-10-CM | POA: Diagnosis present

## 2023-06-12 DIAGNOSIS — J90 Pleural effusion, not elsewhere classified: Secondary | ICD-10-CM | POA: Diagnosis present

## 2023-06-12 DIAGNOSIS — R7303 Prediabetes: Secondary | ICD-10-CM | POA: Diagnosis present

## 2023-06-12 DIAGNOSIS — C7971 Secondary malignant neoplasm of right adrenal gland: Secondary | ICD-10-CM | POA: Diagnosis present

## 2023-06-12 LAB — PHOSPHORUS: Phosphorus: 3.8 mg/dL (ref 2.5–4.6)

## 2023-06-12 MED ORDER — POTASSIUM CHLORIDE CRYS ER 20 MEQ PO TBCR
40.0000 meq | EXTENDED_RELEASE_TABLET | Freq: Once | ORAL | Status: AC
  Administered 2023-06-12: 40 meq via ORAL
  Filled 2023-06-12: qty 2

## 2023-06-12 MED ORDER — FENTANYL 12 MCG/HR TD PT72
1.0000 | MEDICATED_PATCH | TRANSDERMAL | Status: DC
  Administered 2023-06-12: 1 via TRANSDERMAL
  Filled 2023-06-12: qty 1

## 2023-06-12 MED ORDER — FUROSEMIDE 40 MG PO TABS
60.0000 mg | ORAL_TABLET | Freq: Every day | ORAL | Status: DC
  Administered 2023-06-12 – 2023-06-13 (×2): 60 mg via ORAL
  Filled 2023-06-12 (×2): qty 2

## 2023-06-12 NOTE — Progress Notes (Signed)
Daily Progress Note   Patient Name: Miranda Perkins       Date: 06/12/2023 DOB: [birth year]  Age: 62 y.o. MRN#: [id number] Attending Physician: Lurene Shadow, MD Primary Care Physician: [name], DO Admit Date: 06/11/2023  Reason for Consultation/Follow-up: Establishing goals of care  Subjective: Medical records reviewed including progress notes, labs and imaging, [facility] Patient received 6.5 doses of IV dilaudid over the past 24 hours, consistent with her home usage of PO dilaudid. Patient assessed at the bedside. She states that she has no pain currently, as she has just received her PRN dilaudid within the past 30 minutes. Her husband [name] is present visiting.  Created space and opportunity for patient's thoughts and feelings on her current illness. She had some difficulty with pain overnight due to prolonged wait to receive pain medication. Pain was worse than 10/10. Today, she has been able to keep down some blander foods and still struggling with sweets. She hopes that she can stay admitted to continue working on her symptoms until her biopsy on Monday is possibly done inpatient.   Counseled on the risks and benefits of starting fentanyl patch for pain management with continued PRN dilaudid until steady state is achieved. Patient is agreeable to starting this today and further titration as necessary.  Questions and concerns addressed. PMT will continue to support holistically.   Length of Stay: 0   Physical Exam Vitals and nursing note reviewed.  Constitutional:      General: She is not in acute distress. Cardiovascular:     Rate and Rhythm: Normal rate.  Pulmonary:     Effort: Pulmonary effort is normal.  Neurological:     Mental Status: She is alert and oriented to person,  place, and time.  Psychiatric:        Mood and Affect: Mood normal.        Behavior: Behavior normal.             Vital Signs: BP 134/76 (BP Location: Left Arm)   Pulse 89   Temp 98.1 F (36.7 C) (Oral)   Resp 16   Ht 5\' 6"  (1.676 m)   Wt 102.1 kg   SpO2 93%   BMI 36.32 kg/m  SpO2: SpO2: 93 % O2 Device: O2 Device: Room Air O2 Flow Rate:    Palliative Care Assessment & Plan   Patient Profile: 62 y.o. female with past medical history of adrenal hemorrhage, CVA, thyroid nodules, recent C. 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    Patient recently completed 10 days of treatment for C.diff. She has not really eaten much in the past 2 weeks due to vomiting. She has been unable to keep her PO nausea and pain medication down at home. PMT has been consulted to assist with pain management.  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 Please utilize secure chat with additional questions, if there is no response within 30 minutes please call the above phone number.  Palliative Medicine Team providers are available by phone from 7am to 7pm daily and can be reached through the team cell phone.  Should this patient require assistance outside of  these hours, please call the patient's attending physician.

## 2023-06-12 NOTE — Progress Notes (Addendum)
Progress Note    Miranda Perkins  OZH:[id number] DOB: 15-Jan-1961  DOA: 06/11/2023 PCP: [name], DO      Brief Narrative:    Medical records reviewed and are as summarized below:  Miranda Perkins is a 62 y.o. 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  She states she has had stomach pain since she had a hemorrhagic adrenal gland in May. She had C.Diff 2 weeks ago and has been nauseated with poor PO intake since that time.  She completed 10 days of treatment for C.diff  She has not really eaten much in the past 2 weeks due to vomiting.  She even had a hard time keeping liquids down. No one else has been sick at home with GI symptoms.  No foreign travel.      Assessment/Plan:   Principal Problem:   Intractable nausea and vomiting Active Problems:   Hypokalemia   Transaminitis   recent history of C. difficile colitis   Malignant neoplasm of unspecified part of unspecified bronchus or lung (HCC)   Thyroid nodule   Upper airway cough syndrome vs cough variant asthma   Prediabetes     Body mass index is 36.32 kg/m.  (Morbid obesity)    Intractable nausea and vomiting Continue full liquid diet and advance diet as tolerated.  Continue antiemetics as needed for pain. Celiac panel is pending. Discontinue IV fluids.    Hypokalemia Improving.  Continue potassium repletion.   Elevated liver enzymes Probably from acute vomiting. Slowly improving.  Repeat liver enzymes tomorrow. No acute abnormality on CT abdomen.   recent history of C. difficile colitis S/p 10 days of treatment with Dificid (7/26-8/5) Resolved   Malignant neoplasm of unspecified part of unspecified bronchus or lung (HCC) Recently diagnosed in July with  plans for bronchoscopy with biopsy on Monday, 8/12 by Dr. 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    PET scan done 7/25 1. Intensely hypermetabolic solid 5.7 cm posterior left perihilar lung mass, compatible with primary bronchogenic malignancy. 2. 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 3. Mildly hypermetabolic aortocaval retroperitoneal adenopathy, suspicious for nodal metastasis. 4. Intensely hypermetabolic heterogeneous hypodense 1.7 cm right thyroid nodule, suspicious for thyroid malignancy. 5)  Hypermetabolic superficial subcutaneous 0.7 cm soft tissue lesion in the medial proximal right upper extremity, nonspecific, cannot exclude soft tissue metastasis.   -thyroid US 7/9 with no evidence of malignancy and stable node size  -oncology to see s/p biopsy  -pain control with oral dilaudid>change to IV due to intractable N/V -palliative care consult for pain management    Thyroid nodule PET scan showed: [name] Intensely hypermetabolic heterogeneous hypodense 1.7 cm right thyroid nodule, suspicious for thyroid malignancy. Thyroid US 05/11/23: Right thyroid nodules are unchanged in size since 03/26/2016, consistent with benign etiology.     Peripheral edema Restart oral Lasix.  Stop IV fluids. 2D echo in March 2022 showed EF estimated at 65 to 70%, indeterminate LV diastolic parameters.   Other comorbidities include upper airway cough syndrome versus cough variant asthma, prediabetes, history of stroke      Diet Order             Diet full liquid  Room service appropriate? Yes; Fluid consistency: Thin  Diet effective now                            Consultants: None  Procedures: None    Medications:    pantoprazole (PROTONIX) IV  40 mg Intravenous Q24H   Continuous Infusions:  sodium chloride 100 mL/hr at 06/12/23 0629   promethazine (PHENERGAN) injection (IM or  IVPB) Stopped (06/12/23 0028)     Anti-infectives (From admission, onward)    None              Family Communication/Anticipated D/C date and plan/Code Status   DVT prophylaxis: SCDs Start: 06/11/23 0946     Code Status: Full Code  Family Communication: None Disposition Plan: Plan to discharge home tomorrow   Status is: Observation The patient will require care spanning > 2 midnights and should be moved to inpatient because: Poor oral intake, abdominal pain       Subjective:   Interval events noted.  She complains of abdominal pain.  Nausea is a little better.  No vomiting or diarrhea.  She also thinks leg swelling is getting worse because she has not been able to take her Lasix.  Objective:    Vitals:   06/11/23 2018 06/11/23 2358 06/12/23 0419 06/12/23 0724  BP: 135/61 123/61 (!) 124/59 134/76  Pulse: 96 95 95 89  Resp: 18 18 18 16   Temp: 99.2 F (37.3 C) 98.9 F (37.2 C) 98.7 F (37.1 C) 98.1 F (36.7 C)  TempSrc: Oral Oral Oral Oral  SpO2: 100% 95% 93%   Weight:      Height:       No data found.   Intake/Output Summary (Last 24 hours) at 06/12/2023 0956 Last data filed at 06/12/2023 8657 Gross per 24 hour  Intake 2010 ml  Output --  Net 2010 ml   Filed Weights   06/11/23 0305  Weight: 102.1 kg    Exam:   GEN: NAD SKIN: Warm and dry EYES: EOMI ENT: MMM CV: RRR PULM: CTA B ABD: soft, obese, mid abdominal tenderness, +BS CNS: AAO x 3, non focal EXT: B/l leg and pedal edema, no tenderness       Data Reviewed:   I have personally reviewed following labs and imaging studies:  Labs: Labs show the following:   Basic Metabolic Panel: Recent Labs  Lab 06/09/23 1231 06/11/23 0313 06/11/23 0526 06/12/23 0054  NA 130* 135  --  135  K 3.4* 3.1*  --  3.4*  CL 91* 94*  --  99  CO2 27 25  --  25  GLUCOSE 120* 124*  --  91  BUN 10 8  --  6*  CREATININE 0.73 1.00  --  0.90  CALCIUM 9.0 9.3  --  8.5*  MG 2.2  --  2.1  --     GFR Estimated Creatinine Clearance: 76.2 mL/min (by C-G formula based on SCr of 0.9 mg/dL). Liver Function Tests: Recent Labs  Lab 06/09/23 1231 06/11/23 0313 06/12/23 0054  AST 36 43* 32  ALT 83* 94* 73*  ALKPHOS 126 120 87  BILITOT 1.2 1.2 0.9  PROT 7.1 7.1 5.8*  ALBUMIN 3.0* 2.9* 2.5*   Recent Labs  Lab 06/11/23 0313 06/12/23 0054  LIPASE 74* 52*   No results for input(s): "AMMONIA" in the last 168 hours. Coagulation profile Recent Labs  Lab 06/11/23 1205  INR 1.3*    CBC:  Recent Labs  Lab 06/11/23 0313 06/12/23 0054  WBC 12.6* 9.4  HGB 12.1 10.2*  HCT 37.9 32.0*  MCV 82.4 84.9  PLT 467* 374   Cardiac Enzymes: No results for input(s): "CKTOTAL", "CKMB", "CKMBINDEX", "TROPONINI" in the last 168 hours. BNP (last 3 results) No results for input(s): "PROBNP" in the last 8760 hours. CBG: No results for input(s): "GLUCAP" in the last 168 hours. D-Dimer: No results for input(s): "DDIMER" in the last 72 hours. Hgb A1c: No results for input(s): "HGBA1C" in the last 72 hours. Lipid Profile: No results for input(s): "CHOL", "HDL", "LDLCALC", "TRIG", "CHOLHDL", "LDLDIRECT" in the last 72 hours. Thyroid function studies: Recent Labs    06/11/23 1205  TSH 2.857   Anemia work up: No results for input(s): "VITAMINB12", "FOLATE", "FERRITIN", "TIBC", "IRON", "RETICCTPCT" in the last 72 hours. Sepsis Labs: Recent Labs  Lab 06/11/23 0313 06/12/23 0054  WBC 12.6* 9.4    Microbiology No results found for this or any previous visit (from the past 240 hour(s)).  Procedures and diagnostic studies:  CT ABDOMEN PELVIS W CONTRAST  Result Date: 06/11/2023 CLINICAL DATA:  62 year old female with history of abdominal pain. Nausea and vomiting for the past 6 weeks. Additional history of lung cancer. * Tracking Code: BO * EXAM: CT ABDOMEN AND PELVIS WITH CONTRAST TECHNIQUE: Multidetector CT imaging of the abdomen and pelvis was performed using the standard protocol  following bolus administration of intravenous contrast. RADIATION DOSE REDUCTION: This exam was performed according to the departmental dose-optimization program which includes automated exposure control, adjustment of the mA and/or kV according to patient size and/or use of iterative reconstruction technique. CONTRAST:  75mL OMNIPAQUE IOHEXOL 350 MG/ML SOLN COMPARISON:  PET-CT 05/27/2023. CT of the abdomen and pelvis 05/20/2023. FINDINGS: Lower chest: Trace right pleural effusion lying dependently. Hepatobiliary: No definite suspicious cystic or solid hepatic lesions. No intra or extrahepatic biliary ductal dilatation. Gallbladder is unremarkable in appearance. Pancreas: No pancreatic mass. No pancreatic ductal dilatation. No pancreatic or peripancreatic fluid collections or inflammatory changes. Spleen: Unremarkable. Adrenals/Urinary Tract: Large heterogeneous appearing mixed cystic and solid right adrenal mass (axial image 25 of series 3 and coronal image 64 of series 6) which currently measures 10.6 x 7.2 x 14.6 cm, which exerts mass effect upon adjacent structures displacing the right lobe of the liver anteriorly and superiorly, and making broad contact with the upper pole of the right kidney which is mildly distorted by the adrenal mass. Left kidney and left adrenal gland are normal in appearance. No hydroureteronephrosis. Urinary bladder is unremarkable in appearance. Stomach/Bowel: The appearance of the stomach is normal. There is no pathologic dilatation of small bowel or colon. Numerous colonic diverticuli are noted, without surrounding inflammatory changes to suggest an acute diverticulitis at this time. The appendix is not confidently identified and may be surgically absent. Regardless, there are no inflammatory changes noted adjacent to the cecum to suggest the presence of an acute appendicitis at this time. Vascular/Lymphatic: Atherosclerosis in the abdominal aorta and pelvic vasculature. No  lymphadenopathy noted in the abdomen or pelvis. Reproductive: Tubal ligation clips are noted bilaterally. Uterus and ovaries are otherwise unremarkable in appearance. Other: No significant volume of ascites.  No pneumoperitoneum. Musculoskeletal: There are no aggressive appearing lytic or blastic lesions noted in the visualized portions of the skeleton. IMPRESSION: 1. No definite acute findings are noted in the abdomen or pelvis. 2. Large right adrenal metastasis redemonstrated, slightly increased in size compared to prior studies, most compatible with a metastatic lesion. 3.  Trace right pleural effusion lying dependently. 4. Aortic atherosclerosis. 5. Additional incidental findings, as above. Electronically Signed   By: Trudie Reed M.D.   On: 06/11/2023 05:20               LOS: 0 days      [facility]   Pager on [email or web address]. If 7PM-7AM, please contact night-coverage at [email or web address]     06/12/2023, 9:56 AM

## 2023-06-13 DIAGNOSIS — R112 Nausea with vomiting, unspecified: Secondary | ICD-10-CM

## 2023-06-13 DIAGNOSIS — R1010 Upper abdominal pain, unspecified: Secondary | ICD-10-CM | POA: Diagnosis not present

## 2023-06-13 DIAGNOSIS — Z515 Encounter for palliative care: Secondary | ICD-10-CM | POA: Diagnosis not present

## 2023-06-13 LAB — CBC
HCT: 33.2 % — ABNORMAL LOW (ref 36.0–46.0)
Hemoglobin: 10.2 g/dL — ABNORMAL LOW (ref 12.0–15.0)
MCH: 26.1 pg (ref 26.0–34.0)
MCHC: 30.7 g/dL (ref 30.0–36.0)
MCV: 84.9 fL (ref 80.0–100.0)
Platelets: 380 10*3/uL (ref 150–400)
RBC: 3.91 MIL/uL (ref 3.87–5.11)
RDW: 14.1 % (ref 11.5–15.5)
WBC: 10.2 10*3/uL (ref 4.0–10.5)
nRBC: 0 % (ref 0.0–0.2)

## 2023-06-13 LAB — COMPREHENSIVE METABOLIC PANEL WITH GFR
ALT: 58 U/L — ABNORMAL HIGH (ref 0–44)
AST: 23 U/L (ref 15–41)
Albumin: 2.5 g/dL — ABNORMAL LOW (ref 3.5–5.0)
Alkaline Phosphatase: 95 U/L (ref 38–126)
Anion gap: 12 (ref 5–15)
BUN: 8 mg/dL (ref 8–23)
CO2: 23 mmol/L (ref 22–32)
Calcium: 8.4 mg/dL — ABNORMAL LOW (ref 8.9–10.3)
Chloride: 97 mmol/L — ABNORMAL LOW (ref 98–111)
Creatinine, Ser: 0.87 mg/dL (ref 0.44–1.00)
GFR, Estimated: >60 mL/min (ref >60)
Glucose, Bld: 104 mg/dL — ABNORMAL HIGH (ref 70–99)
Potassium: 3.5 mmol/L (ref 3.5–5.1)
Sodium: 132 mmol/L — ABNORMAL LOW (ref 135–145)
Total Bilirubin: 0.8 mg/dL (ref 0.3–1.2)
Total Protein: 5.8 g/dL — ABNORMAL LOW (ref 6.5–8.1)

## 2023-06-13 MED ORDER — FENTANYL 12 MCG/HR TD PT72: 1.0000 | MEDICATED_PATCH | TRANSDERMAL | 0 refills | Status: DC

## 2023-06-13 MED ORDER — POTASSIUM CHLORIDE CRYS ER 20 MEQ PO TBCR
40.0000 meq | EXTENDED_RELEASE_TABLET | Freq: Once | ORAL | Status: AC
  Administered 2023-06-13: 40 meq via ORAL
  Filled 2023-06-13: qty 2

## 2023-06-13 NOTE — Discharge Summary (Signed)
Physician Discharge Summary   Patient: Miranda Perkins MRN: [id number] DOB: 1961-03-06  Admit date:     06/11/2023  Discharge date: 06/13/23  Discharge Physician: Lurene Shadow   PCP: [name], DO   Recommendations at discharge:   Follow-up with PCP in 1 week Follow-up with Dr. Delton Coombes for bronchoscopy on 06/14/2023.  Discharge Diagnoses: Principal Problem:   Intractable nausea and vomiting Active Problems:   Hypokalemia   Transaminitis   recent history of C. difficile colitis   Malignant neoplasm of unspecified part of unspecified bronchus or lung (HCC)   Thyroid nodule   Upper airway cough syndrome vs cough variant asthma   Prediabetes  Resolved Problems:   Depression   Mixed hyperlipidemia   Intractable pain  Hospital Course:  Miranda Perkins is a 62 y.o. 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  She states she has had stomach pain since she had a hemorrhagic adrenal gland in May. She had C.Diff 2 weeks ago and has been nauseated with poor PO intake since that time.  She completed 10 days of treatment for C.diff  She has not really eaten much in the past 2 weeks due to vomiting.  She even had a hard time keeping liquids down. No one else has been sick at home with GI symptoms.  No foreign travel.   Assessment and Plan:  Intractable nausea and vomiting Resolved.  She has been able to tolerate regular diet without any problems. Celiac panel is pending.  She can follow-up with PCP for results.    Hypokalemia Improved   Elevated liver enzymes Improved.  Probably from acute vomiting. No acute abnormality on CT abdomen.   recent history of C. difficile colitis S/p 10 days of treatment with Dificid (7/26-8/5) Resolved    Malignant neoplasm of unspecified part of unspecified bronchus or lung (HCC) Recently diagnosed in July with plans for bronchoscopy with biopsy on Monday, 8/12 by Dr. 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    PET scan done 7/25 1. Intensely hypermetabolic solid 5.7 cm posterior left perihilar lung mass, compatible with primary bronchogenic malignancy. 2. 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 3. Mildly hypermetabolic aortocaval retroperitoneal adenopathy, suspicious for nodal metastasis. 4. Intensely hypermetabolic heterogeneous hypodense 1.7 cm right thyroid nodule, suspicious for thyroid malignancy. 5)  Hypermetabolic superficial subcutaneous 0.7 cm soft tissue lesion in the medial proximal right upper extremity, nonspecific, cannot exclude soft tissue metastasis.   -thyroid US 7/9 with no evidence of malignancy and stable node size  -oncology to see s/p biopsy  Continue oral Dilaudid as needed for pain.  Fentanyl patch has been prescribed per recommendation from palliative care team.   Thyroid nodule PET scan showed: [name] Intensely hypermetabolic heterogeneous hypodense 1.7 cm right thyroid nodule, suspicious for thyroid malignancy. Thyroid US 05/11/23: Right thyroid nodules are unchanged in size since 03/26/2016, consistent with benign etiology.     Peripheral edema Continue Lasix 2D echo in March 2022 showed EF estimated at 65 to 70%, indeterminate LV diastolic parameters.     Other comorbidities include upper airway cough syndrome versus cough variant asthma, prediabetes, history of stroke     Her condition has improved and she is deemed  stable for discharge to home today.            Consultants: None Procedures performed: None Disposition: Home Diet recommendation:  Discharge Diet Orders (From admission, onward)     Start     Ordered   06/13/23 0000  Diet - low sodium heart  healthy        06/13/23 0912           Cardiac diet DISCHARGE MEDICATION: Allergies as of 06/13/2023       Reactions   Influenza Vaccines Anaphylaxis, Other (See Comments)   Per patient   Penicillins Anaphylaxis, Other (See Comments)   Articaine Other (See Comments)   Caused infection   Cortisone Other (See Comments)   Turned red and ran a low grade fever for 3 days   Other Rash   bandaids- skin turns red and a rash         Medication List     TAKE these medications    albuterol 108 (90 Base) MCG/ACT inhaler Commonly known as: VENTOLIN HFA Inhale 2 puffs into the lungs every 6 (six) hours as needed.   fentaNYL 12 MCG/HR Commonly known as: DURAGESIC Place 1 patch onto the skin every 3 (three) days. Next dose on Tuesday, 06/15/2023 at 1:30 pm Start taking on: June 15, 2023   furosemide 40 MG tablet Commonly known as: LASIX Take 1.5 tablets (60 mg total) by mouth daily.   HYDROmorphone 2 MG tablet Commonly known as: Dilaudid Take 1 tablet (2 mg total) by mouth every 3 (three) hours as needed for severe pain.   ondansetron 8 MG tablet Commonly known as: ZOFRAN Take 1 tablet (8 mg total) by mouth every 8 (eight) hours as needed for nausea or vomiting.   progesterone 200 MG capsule Commonly known as: Prometrium Nightly 10 days every 3 months   promethazine 25 MG tablet Commonly known as: PHENERGAN Take 1 tablet (25 mg total) by mouth every 6 (six) hours as needed for nausea or vomiting.   promethazine 25 MG suppository Commonly known as: PHENERGAN Place 1 suppository (25 mg total) rectally every 6 (six) hours as needed for nausea or vomiting.        Discharge Exam: Filed Weights   06/11/23 0305  Weight: 102.1 kg   GEN: NAD SKIN: Warm and dry EYES: No pallor or icterus ENT: MMM CV: RRR PULM: CTA B ABD: soft, obese, NT, +BS CNS: AAO x 3, non focal EXT: B/ pedal edema, no erythema or tenderness   Condition at discharge: good  The results of  significant diagnostics from this hospitalization (including imaging, microbiology, ancillary and laboratory) are listed below for reference.   Imaging Studies: CT ABDOMEN PELVIS W CONTRAST  Result Date: 06/11/2023 CLINICAL DATA:  62 year old female with history of abdominal pain. Nausea and vomiting for the past 6 weeks. Additional history of lung cancer. * Tracking Code: BO * EXAM: CT ABDOMEN AND PELVIS WITH CONTRAST TECHNIQUE: Multidetector CT imaging of the abdomen and pelvis was performed using the standard protocol following bolus administration of intravenous contrast. RADIATION DOSE REDUCTION: This exam was performed according to the departmental dose-optimization program which includes automated exposure control, adjustment of the mA and/or kV according to patient size and/or use of iterative reconstruction technique. CONTRAST:  75mL OMNIPAQUE IOHEXOL 350 MG/ML SOLN COMPARISON:  PET-CT 05/27/2023. CT of the abdomen and pelvis 05/20/2023. FINDINGS: Lower chest: Trace right pleural effusion lying dependently. Hepatobiliary: No definite suspicious cystic or solid hepatic lesions. No intra or extrahepatic  biliary ductal dilatation. Gallbladder is unremarkable in appearance. Pancreas: No pancreatic mass. No pancreatic ductal dilatation. No pancreatic or peripancreatic fluid collections or inflammatory changes. Spleen: Unremarkable. Adrenals/Urinary Tract: Large heterogeneous appearing mixed cystic and solid right adrenal mass (axial image 25 of series 3 and coronal image 64 of series 6) which currently measures 10.6 x 7.2 x 14.6 cm, which exerts mass effect upon adjacent structures displacing the right lobe of the liver anteriorly and superiorly, and making broad contact with the upper pole of the right kidney which is mildly distorted by the adrenal mass. Left kidney and left adrenal gland are normal in appearance. No hydroureteronephrosis. Urinary bladder is unremarkable in appearance. Stomach/Bowel: The  appearance of the stomach is normal. There is no pathologic dilatation of small bowel or colon. Numerous colonic diverticuli are noted, without surrounding inflammatory changes to suggest an acute diverticulitis at this time. The appendix is not confidently identified and may be surgically absent. Regardless, there are no inflammatory changes noted adjacent to the cecum to suggest the presence of an acute appendicitis at this time. Vascular/Lymphatic: Atherosclerosis in the abdominal aorta and pelvic vasculature. No lymphadenopathy noted in the abdomen or pelvis. Reproductive: Tubal ligation clips are noted bilaterally. Uterus and ovaries are otherwise unremarkable in appearance. Other: No significant volume of ascites.  No pneumoperitoneum. Musculoskeletal: There are no aggressive appearing lytic or blastic lesions noted in the visualized portions of the skeleton. IMPRESSION: 1. No definite acute findings are noted in the abdomen or pelvis. 2. Large right adrenal metastasis redemonstrated, slightly increased in size compared to prior studies, most compatible with a metastatic lesion. 3. Trace right pleural effusion lying dependently. 4. Aortic atherosclerosis. 5. Additional incidental findings, as above. Electronically Signed   By: Trudie Reed M.D.   On: 06/11/2023 05:20   NM PET Image Initial (PI) Skull Base To Thigh  Result Date: 06/04/2023 CLINICAL DATA:  Initial treatment strategy for left lung mass. EXAM: NUCLEAR MEDICINE PET SKULL BASE TO THIGH TECHNIQUE: 11.6 mCi F-18 FDG was injected intravenously. Full-ring PET imaging was performed from the skull base to thigh after the radiotracer. CT data was obtained and used for attenuation correction and anatomic localization. Fasting blood glucose: 98 mg/dl COMPARISON:  09/81/1914 chest CT angiogram and CT abdomen/pelvis. FINDINGS: Mediastinal blood pool activity: SUV max 2.3 Liver activity: SUV max NA NECK: No hypermetabolic lymph nodes in the neck.  Intensely hypermetabolic heterogeneous hypodense 1.7 cm right thyroid nodule with max SUV 41.9. Incidental CT findings: None. CHEST: Intensely hypermetabolic solid 5.7 x 4.1 cm posterior left perihilar lung mass with max SUV 20.7 (series 202/image 97). No additional hypermetabolic pulmonary findings. Indistinct 1.3 cm medial left lower lobe nodule with no significant FDG uptake (series 203/image 90), unchanged from recent chest CT. Posterior apical right upper lobe indistinct 0.5 cm nodule, below PET resolution, unchanged (series 203/image 46). No enlarged or hypermetabolic mediastinal, right hilar or axillary nodes. Hypermetabolic superficial subcutaneous 0.7 cm soft tissue lesion in medial proximal right upper extremity with max SUV 5.6 (series 203/image 80). Incidental CT findings: Atherosclerotic nonaneurysmal thoracic aorta. ABDOMEN/PELVIS: Hypermetabolic heterogeneously hyperdense 9.4 x 7.5 cm right adrenal mass with max SUV 28.1 with small amount of ill-defined and surrounding layering fluid in the right retroperitoneum, compatible with hemorrhagic right adrenal metastasis. Mildly enlarged and mildly hypermetabolic 1.0 cm aortocaval node with max SUV 4.5 (series 2 series 2/image 159). No additional enlarged or hypermetabolic abdominopelvic nodes. No abnormal hypermetabolic activity within the liver, pancreas, left adrenal gland, or spleen. Incidental  CT findings: Atherosclerotic nonaneurysmal abdominal aorta. SKELETON: No focal hypermetabolic activity to suggest skeletal metastasis. Incidental CT findings: Surgical hardware from ACDF. IMPRESSION: 1. Intensely hypermetabolic solid 5.7 cm posterior left perihilar lung mass, compatible with primary bronchogenic malignancy. 2. 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 3. Mildly hypermetabolic aortocaval retroperitoneal adenopathy, suspicious for nodal metastasis. 4. Intensely hypermetabolic heterogeneous hypodense 1.7 cm right thyroid nodule,  suspicious for thyroid malignancy. Recommend non-emergent thyroid ultrasound and biopsy of nodule if detected. Reference: J Am Coll Radiol. 2015 Feb;12(2): 143-50 5. Hypermetabolic superficial subcutaneous 0.7 cm soft tissue lesion in the medial proximal right upper extremity, nonspecific, cannot exclude soft tissue metastasis. 6. Additional smaller indistinct bilateral pulmonary nodules without significant FDG uptake, recommend attention on follow-up noncontrast chest CT. 7.  Aortic Atherosclerosis (ICD10-I70.0). Electronically Signed   By: Delbert Phenix M.D.   On: 06/04/2023 15:44   CT ABDOMEN PELVIS W CONTRAST  Result Date: 05/20/2023 CLINICAL DATA:  Right flank and lower quadrant pain EXAM: CT ANGIOGRAPHY CHEST CT ABDOMEN AND PELVIS WITH CONTRAST TECHNIQUE: Multidetector CT imaging of the chest was performed using the standard protocol during bolus administration of intravenous contrast. Multiplanar CT image reconstructions and MIPs were obtained to evaluate the vascular anatomy. Multidetector CT imaging of the abdomen and pelvis was performed using the standard protocol during bolus administration of intravenous contrast. RADIATION DOSE REDUCTION: This exam was performed according to the departmental dose-optimization program which includes automated exposure control, adjustment of the mA and/or kV according to patient size and/or use of iterative reconstruction technique. CONTRAST:  OMNIPAQUE IOHEXOL 350 MG/ML SOLN COMPARISON:  CT 05/07/2023, chest x-ray 05/07/2023, CT 03/28/2023, CT 11/29/2020 further enlargement of right adrenal gland now measuring 8.9 x 6.7 cm, most recent prior measurement of 7.7 by 6.2 cm. Low-density cystic area on series 2, image 22 posteriorly could reflect organizing hematoma. Interim finding of moderate slightly loculated fluid surrounding the superior pole right kidney, series 2, image 31, coronal series 4, image 84. Slight interval increase in right perinephric stranding.  FINDINGS: CTA CHEST FINDINGS Cardiovascular: Satisfactory opacification of the pulmonary arteries to the segmental level. No evidence of pulmonary embolism. Normal heart size. No pericardial effusion. Nonaneurysmal aorta. No dissection is seen. Mediastinum/Nodes: Midline trachea. No thyroid mass. Esophagus within normal limits. Lungs/Pleura: No pleural effusion or pneumothorax. Solid left hilar mass measuring approximately 5.3 by 3.9 cm with partial in case mint of left-sided pulmonary vessels. Mild narrowing of left lower lobe bronchi but no occlusion. Irregular focus of airspace disease in the left lower lobe measuring 16 by 8 mm on series 6 image 75. Right upper lobe ground-glass nodule measuring 7 mm on series 6, image 48. Musculoskeletal: No acute or suspicious osseous abnormality. Review of the MIP images confirms the above findings. CT ABDOMEN and PELVIS FINDINGS Hepatobiliary: No focal liver abnormality is seen. No gallstones, gallbladder wall thickening, or biliary dilatation. Pancreas: Unremarkable. No pancreatic ductal dilatation or surrounding inflammatory changes. Spleen: Normal in size without focal abnormality. Adrenals/Urinary Tract: Left adrenal gland is normal. Further interval enlargement of right adrenal gland, measuring 8.7 x 6.9 cm, previously 7.7 cm. Cystic low-density area posteriorly on series 2, image 21 could reflect organizing hematoma. Interim finding of additional low-density collection measuring 5.6 x 4.6 cm on series 2, image 28 at the inferior aspect of the right adrenal gland, some surrounding low-density fluid which is also new or increased and is seen surrounding the upper pole of right kidney. Kidneys show no hydronephrosis. The bladder is unremarkable Stomach/Bowel:  The stomach is nonenlarged. There is no dilated small bowel. No acute bowel wall thickening. Appendix not well seen. Vascular/Lymphatic: Mild aortic atherosclerosis. No aneurysm. No suspicious lymph nodes.  Reproductive: Bilateral tubal clips. Uterus unremarkable. No adnexal mass Other: Negative for pelvic effusion or free air. Small fat containing umbilical hernia Musculoskeletal: No acute or suspicious osseous abnormality. Review of the MIP images confirms the above findings. IMPRESSION: 1. Negative for acute pulmonary embolus. 2. 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 Multi disciplinary thoracic consultation is recommended. 3. Irregular focus of airspace disease in the left lower lobe measuring 16 x 8 mm, indeterminate in appearance for focus of infection, inflammation, or possible nodule. Additional 7 mm right upper lobe ground-glass nodule. Attention on follow-up imaging. 4. Further enlargement of the right adrenal gland since most recent prior, suspect that there is underlying right adrenal mass that is complicated by hemorrhage. Some of the measured right adrenal gland/mass is felt secondary to organizing hematoma. Since the most recent prior, interim development of additional low-density fluid at the inferior aspect of the right adrenal and surrounding the upper pole of right kidney which could be due to additional interval hemorrhage but there is no hyperdense blood at this time or evidence for extravasation. 5. Aortic atherosclerosis. Aortic Atherosclerosis (ICD10-I70.0). Electronically Signed   By: Jasmine Pang M.D.   On: 05/20/2023 19:17   CT Angio Chest PE W and/or Wo Contrast  Result Date: 05/20/2023 CLINICAL DATA:  Right flank and lower quadrant pain EXAM: CT ANGIOGRAPHY CHEST CT ABDOMEN AND PELVIS WITH CONTRAST TECHNIQUE: Multidetector CT imaging of the chest was performed using the standard protocol during bolus administration of intravenous contrast. Multiplanar CT image reconstructions and MIPs were obtained to evaluate the vascular anatomy. Multidetector CT imaging of the abdomen and pelvis was performed using the standard protocol during bolus  administration of intravenous contrast. RADIATION DOSE REDUCTION: This exam was performed according to the departmental dose-optimization program which includes automated exposure control, adjustment of the mA and/or kV according to patient size and/or use of iterative reconstruction technique. CONTRAST:  OMNIPAQUE IOHEXOL 350 MG/ML SOLN COMPARISON:  CT 05/07/2023, chest x-ray 05/07/2023, CT 03/28/2023, CT 11/29/2020 further enlargement of right adrenal gland now measuring 8.9 x 6.7 cm, most recent prior measurement of 7.7 by 6.2 cm. Low-density cystic area on series 2, image 22 posteriorly could reflect organizing hematoma. Interim finding of moderate slightly loculated fluid surrounding the superior pole right kidney, series 2, image 31, coronal series 4, image 84. Slight interval increase in right perinephric stranding. FINDINGS: CTA CHEST FINDINGS Cardiovascular: Satisfactory opacification of the pulmonary arteries to the segmental level. No evidence of pulmonary embolism. Normal heart size. No pericardial effusion. Nonaneurysmal aorta. No dissection is seen. Mediastinum/Nodes: Midline trachea. No thyroid mass. Esophagus within normal limits. Lungs/Pleura: No pleural effusion or pneumothorax. Solid left hilar mass measuring approximately 5.3 by 3.9 cm with partial in case mint of left-sided pulmonary vessels. Mild narrowing of left lower lobe bronchi but no occlusion. Irregular focus of airspace disease in the left lower lobe measuring 16 by 8 mm on series 6 image 75. Right upper lobe ground-glass nodule measuring 7 mm on series 6, image 48. Musculoskeletal: No acute or suspicious osseous abnormality. Review of the MIP images confirms the above findings. CT ABDOMEN and PELVIS FINDINGS Hepatobiliary: No focal liver abnormality is seen. No gallstones, gallbladder wall thickening, or biliary dilatation. Pancreas: Unremarkable. No pancreatic ductal dilatation or surrounding inflammatory changes. Spleen: Normal  in size without focal abnormality. Adrenals/Urinary Tract: Left adrenal gland is normal. Further interval enlargement of right adrenal gland, measuring 8.7 x 6.9 cm, previously 7.7 cm. Cystic low-density area posteriorly on series 2, image 21 could reflect organizing hematoma. Interim finding of additional low-density collection measuring 5.6 x 4.6 cm on series 2, image 28 at the inferior aspect of the right adrenal gland, some surrounding low-density fluid which is also new or increased and is seen surrounding the upper pole of right kidney. Kidneys show no hydronephrosis. The bladder is unremarkable Stomach/Bowel: The stomach is nonenlarged. There is no dilated small bowel. No acute bowel wall thickening. Appendix not well seen. Vascular/Lymphatic: Mild aortic atherosclerosis. No aneurysm. No suspicious lymph nodes. Reproductive: Bilateral tubal clips. Uterus unremarkable. No adnexal mass Other: Negative for pelvic effusion or free air. Small fat containing umbilical hernia Musculoskeletal: No acute or suspicious osseous abnormality. Review of the MIP images confirms the above findings. IMPRESSION: 1. Negative for acute pulmonary embolus. 2. 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 Multi disciplinary thoracic consultation is recommended. 3. Irregular focus of airspace disease in the left lower lobe measuring 16 x 8 mm, indeterminate in appearance for focus of infection, inflammation, or possible nodule. Additional 7 mm right upper lobe ground-glass nodule. Attention on follow-up imaging. 4. Further enlargement of the right adrenal gland since most recent prior, suspect that there is underlying right adrenal mass that is complicated by hemorrhage. Some of the measured right adrenal gland/mass is felt secondary to organizing hematoma. Since the most recent prior, interim development of additional low-density fluid at the inferior aspect of the right adrenal and surrounding the  upper pole of right kidney which could be due to additional interval hemorrhage but there is no hyperdense blood at this time or evidence for extravasation. 5. Aortic atherosclerosis. Aortic Atherosclerosis (ICD10-I70.0). Electronically Signed   By: Jasmine Pang M.D.   On: 05/20/2023 19:17   US PELVIC COMPLETE WITH TRANSVAGINAL  Result Date: 05/19/2023 Images from the original result were not included.  ..an Financial trader of Ultrasound Medicine Technical sales engineer) accredited practice Center for [facility] [address] [place] [address] Ordering Provider: Lazaro Arms, MD                                                                                                                                   GYNECOLOGIC SONOGRAM KIYONNA BACKER is a 62 y.o. [phone] No LMP recorded. Patient is postmenopausal. She is here for a pelvic sonogram for postmenopausal bleeding. Uterus                      6.8 x 4.7 x 5.9 cm, Total uterine volume 98 cc, heterogeneous, anteverted uterus with echogenic myometrial linear striations, ? Adenomyosis Endometrium          8.2 mm, symmetrical, avascular, thicken, homogenous endometrium Right  ovary             2.6 x 1.7 x 1.9 cm, normal (limited view) Left ovary                2 x 2.4 x 2.2 cm, normal (limited view) No free fluid,multiple simple nabothian cysts Technician Comments: PELVIC US TA/TV: Heterogeneous anteverted uterus with echogenic myometrial linear striations, ? Adenomyosis,avascular thicken homogenous endometrium, EEC 8.2 mm,normal ovaries (limited view), no free fluid,no pain during ultrasound Chaperone [address] Flora Lipps 05/19/2023 3:50 PM  Clinical Impression and recommendations: I have reviewed the sonogram results above, combined with the patient's current clinical course, below are my impressions and any appropriate recommendations for management based on the sonographic findings. Uterus normal size shape and contour, adenomyosis  Endometrium 8.2 mm no polyps homogenous no blood flow Ovaries: normal size shape and morphology for post menopausal woman Lazaro Arms 05/19/2023 4:26 PM   Microbiology: Results for orders placed or performed in visit on 05/26/23  C difficile quick screen w PCR reflex     Status: Abnormal   Collection Time: 05/27/23  3:00 PM   Specimen: STOOL  Result Value Ref Range Status   C Diff antigen POSITIVE (A) NEGATIVE Final   C Diff toxin POSITIVE (A) NEGATIVE Final   C Diff interpretation Toxin producing C. difficile detected.  Final    Comment: CRITICAL RESULT CALLED TO, READ BACK BY AND VERIFIED WITH:  DR Ellin Saba 05/27/23 1705 NMN Performed at [facility], [address] [place], [place] [address]   GI pathogen panel by PCR, stool     Status: None   Collection Time: 05/27/23  3:00 PM   Specimen: Stool  Result Value Ref Range Status   Plesiomonas shigelloides NOT DETECTED NOT DETECTED Final   Yersinia enterocolitica NOT DETECTED NOT DETECTED Final   Vibrio NOT DETECTED NOT DETECTED Final   Enteropathogenic E coli NOT DETECTED NOT DETECTED Final   E coli (ETEC) LT/ST NOT DETECTED NOT DETECTED Final   E coli 0157 by PCR Not applicable NOT DETECTED Final   Cryptosporidium by PCR NOT DETECTED NOT DETECTED Final   Entamoeba histolytica NOT DETECTED NOT DETECTED Final   Adenovirus F 40/41 NOT DETECTED NOT DETECTED Final   Norovirus GI/GII NOT DETECTED NOT DETECTED Final   Sapovirus NOT DETECTED NOT DETECTED Final    Comment: (NOTE) Performed At: [facility] [address] [place], [place] [id number] Jolene Schimke MD NF:[id number]    Vibrio cholerae NOT DETECTED NOT DETECTED Final   Campylobacter by PCR NOT DETECTED NOT DETECTED Final   Salmonella by PCR NOT DETECTED NOT DETECTED Final   E coli (STEC) NOT DETECTED NOT DETECTED Final   Enteroaggregative E coli NOT DETECTED NOT DETECTED Final   Shigella by PCR NOT DETECTED NOT DETECTED Final   Cyclospora cayetanensis NOT  DETECTED NOT DETECTED Final   Astrovirus NOT DETECTED NOT DETECTED Final   G lamblia by PCR NOT DETECTED NOT DETECTED Final   Rotavirus A by PCR NOT DETECTED NOT DETECTED Final    Labs: CBC: Recent Labs  Lab 06/11/23 0313 06/12/23 0054 06/13/23 0020  WBC 12.6* 9.4 10.2  HGB 12.1 10.2* 10.2*  HCT 37.9 32.0* 33.2*  MCV 82.4 84.9 84.9  PLT 467* 374 380   Basic Metabolic Panel: Recent Labs  Lab 06/09/23 1231 06/11/23 0313 06/11/23 0526 06/12/23 0054 06/13/23 0020  NA 130* 135  --  135 132*  K 3.4* 3.1*  --  3.4* 3.5  CL 91* 94*  --  99 97*  CO2 27 25  --  25 23  GLUCOSE 120* 124*  --  91 104*  BUN 10 8  --  6* 8  CREATININE 0.73 1.00  --  0.90 0.87  CALCIUM 9.0 9.3  --  8.5* 8.4*  MG 2.2  --  2.1  --   --   PHOS  --   --   --  3.8  --    Liver Function Tests: Recent Labs  Lab 06/09/23 1231 06/11/23 0313 06/12/23 0054 06/13/23 0020  AST 36 43* 32 23  ALT 83* 94* 73* 58*  ALKPHOS 126 120 87 95  BILITOT 1.2 1.2 0.9 0.8  PROT 7.1 7.1 5.8* 5.8*  ALBUMIN 3.0* 2.9* 2.5* 2.5*   CBG: No results for input(s): "GLUCAP" in the last 168 hours.  Discharge time spent: greater than 30 minutes.  Signed: Lurene Shadow, MD [facility] 06/13/2023

## 2023-06-13 NOTE — Plan of Care (Signed)
Patient alert/oriented X4. Patient compliant with medication administration and VSS. Patient AVS discharge instructions explained in detail and PIV removed prior to discharge. Patient personal belongings packed up at bedside. Patient waiting husband's arrival to complete discharge.  Problem: Education: Goal: Knowledge of General Education information will improve Description: Including pain rating scale, medication(s)/side effects and non-pharmacologic comfort measures Outcome: Adequate for Discharge   Problem: Health Behavior/Discharge Planning: Goal: Ability to manage health-related needs will improve Outcome: Adequate for Discharge   Problem: Clinical Measurements: Goal: Ability to maintain clinical measurements within normal limits will improve Outcome: Adequate for Discharge   Problem: Clinical Measurements: Goal: Will remain free from infection Outcome: Adequate for Discharge   Problem: Clinical Measurements: Goal: Diagnostic test results will improve Outcome: Adequate for Discharge   Problem: Clinical Measurements: Goal: Respiratory complications will improve Outcome: Adequate for Discharge   Problem: Clinical Measurements: Goal: Cardiovascular complication will be avoided Outcome: Adequate for Discharge   Problem: Activity: Goal: Risk for activity intolerance will decrease Outcome: Adequate for Discharge   Problem: Nutrition: Goal: Adequate nutrition will be maintained Outcome: Adequate for Discharge   Problem: Coping: Goal: Level of anxiety will decrease Outcome: Adequate for Discharge   Problem: Elimination: Goal: Will not experience complications related to bowel motility Outcome: Adequate for Discharge   Problem: Elimination: Goal: Will not experience complications related to urinary retention Outcome: Adequate for Discharge   Problem: Pain Managment: Goal: General experience of comfort will improve Outcome: Adequate for Discharge   Problem:  Safety: Goal: Ability to remain free from injury will improve Outcome: Adequate for Discharge   Problem: Skin Integrity: Goal: Risk for impaired skin integrity will decrease Outcome: Adequate for Discharge

## 2023-06-13 NOTE — Progress Notes (Signed)
Daily Progress Note   Patient Name: Miranda Perkins       Date: 06/13/2023 DOB: 06-20-1961  Age: 62 y.o. MRN#: [id number] Attending Physician: Lurene Shadow, MD Primary Care Physician: [name], DO Admit Date: 06/11/2023  Reason for Consultation/Follow-up: Establishing goals of care  Subjective: Medical records reviewed including progress notes, labs and imaging,  Patient received 5 doses of IV dilaudid over the past 24 hours. Patient assessed at the bedside. She is preparing for discharge and reports 8/10 pain with radiation to her back. Apparently she was told that she could have a pain pill and then when discharge plans occurred this was no longer an option. Her husband is at home picking up her prescription pain medicine and coming back to pick her up.  Fentanyl patch has yet to take noticeable effect as it was applied less than 24 hours ago. Outpatient palliative care was explained and offered for ongoing symptom management. Patient is agreeable and appreciative.  Questions and concerns addressed. PMT will continue to support holistically.   Length of Stay: 1   Physical Exam Vitals and nursing note reviewed.  Constitutional:      General: She is not in acute distress. Cardiovascular:     Rate and Rhythm: Normal rate.  Pulmonary:     Effort: Pulmonary effort is normal.  Neurological:     Mental Status: She is alert and oriented to person, place, and time.  Psychiatric:        Mood and Affect: Mood normal.        Behavior: Behavior normal.            Vital Signs: BP 126/63 (BP Location: Left Arm)   Pulse 100   Temp 98.5 F (36.9 C) (Oral)   Resp 16   Ht 5\' 6"  (1.676 m)   Wt 102.1 kg   SpO2 98%   BMI 36.32 kg/m  SpO2: SpO2: 98 % O2 Device: O2 Device: Room  Air O2 Flow Rate:    Palliative Care Assessment & Plan   Patient Profile: 62 y.o. female with past medical history of adrenal hemorrhage, CVA, thyroid nodules, recent C. 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    Patient recently completed 10 days of treatment for C.diff. She has not really eaten much in the past 2 weeks due to vomiting. She has been unable to keep her PO nausea and pain medication down at home. PMT has been consulted to assist with pain management.  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 Please utilize secure chat with additional questions, if there is no response within 30 minutes please call the above phone number.  Palliative Medicine Team providers are available by phone from 7am to 7pm daily and can be reached through the team cell phone.  Should this patient require assistance outside of these hours, please call the patient's attending physician.

## 2023-06-13 NOTE — TOC Transition Note (Signed)
Transition of Care [facility]) - CM/SW Discharge Note   Patient Details  Name: Miranda Perkins MRN: [id number] Date of Birth: [birth year]  [facility] CM/SW Contact:  Lawerance Sabal, RN Phone Number: 06/13/2023, 11:38 AM   Clinical Narrative:     Spoke to patient, confirmed she would like palliative care, referral made to [facility]) they are going to call her on Thursday per her request.         Patient Goals and CMS Choice      Discharge Placement                         Discharge Plan and Services Additional resources added to the After Visit Summary for                                       Social Determinants of Health (SDOH) Interventions SDOH Screenings   Food Insecurity: No Food Insecurity (06/11/2023)  Housing: Low Risk  (06/11/2023)  Transportation Needs: No Transportation Needs (06/11/2023)  Utilities: Not At Risk (06/11/2023)  Alcohol Screen: Low Risk  (04/27/2023)  Depression (PHQ2-9): Low Risk  (04/15/2023)  Financial Resource Strain: Low Risk  (06/09/2023)  Physical Activity: Inactive (06/09/2023)  Social Connections: Unknown (06/09/2023)  Recent Concern: Social Connections - Moderately Isolated (04/27/2023)  Stress: Stress Concern Present (06/09/2023)  Tobacco Use: Medium Risk (06/11/2023)     Readmission Risk Interventions     No data to display

## 2023-06-14 ENCOUNTER — Telehealth: Payer: Self-pay | Admitting: Family Medicine

## 2023-06-14 ENCOUNTER — Telehealth: Payer: Self-pay

## 2023-06-14 ENCOUNTER — Encounter (HOSPITAL_COMMUNITY): Payer: Self-pay | Admitting: Emergency Medicine

## 2023-06-14 ENCOUNTER — Ambulatory Visit (HOSPITAL_COMMUNITY): Admitting: Anesthesiology

## 2023-06-14 ENCOUNTER — Ambulatory Visit (HOSPITAL_COMMUNITY)

## 2023-06-14 ENCOUNTER — Ambulatory Visit (HOSPITAL_BASED_OUTPATIENT_CLINIC_OR_DEPARTMENT_OTHER): Admitting: Anesthesiology

## 2023-06-14 ENCOUNTER — Ambulatory Visit (HOSPITAL_COMMUNITY)
Admission: RE | Admit: 2023-06-14 | Discharge: 2023-06-14 | Disposition: A | Discharge status: Home / Self Care | Source: Ambulatory Visit | Attending: Emergency Medicine | Admitting: Emergency Medicine

## 2023-06-14 ENCOUNTER — Other Ambulatory Visit: Payer: Self-pay

## 2023-06-14 ENCOUNTER — Encounter (HOSPITAL_COMMUNITY)
Admission: RE | Disposition: A | Discharge status: Home / Self Care | Payer: Self-pay | Source: Ambulatory Visit | Attending: Emergency Medicine

## 2023-06-14 DIAGNOSIS — Z6836 Body mass index (BMI) 36.0-36.9, adult: Secondary | ICD-10-CM | POA: Insufficient documentation

## 2023-06-14 DIAGNOSIS — R918 Other nonspecific abnormal finding of lung field: Secondary | ICD-10-CM

## 2023-06-14 DIAGNOSIS — Z8673 Personal history of transient ischemic attack (TIA), and cerebral infarction without residual deficits: Secondary | ICD-10-CM | POA: Insufficient documentation

## 2023-06-14 DIAGNOSIS — E669 Obesity, unspecified: Secondary | ICD-10-CM | POA: Insufficient documentation

## 2023-06-14 DIAGNOSIS — I4891 Unspecified atrial fibrillation: Secondary | ICD-10-CM | POA: Diagnosis not present

## 2023-06-14 DIAGNOSIS — C3432 Malignant neoplasm of lower lobe, left bronchus or lung: Secondary | ICD-10-CM | POA: Diagnosis not present

## 2023-06-14 DIAGNOSIS — C3490 Malignant neoplasm of unspecified part of unspecified bronchus or lung: Secondary | ICD-10-CM | POA: Diagnosis present

## 2023-06-14 DIAGNOSIS — R911 Solitary pulmonary nodule: Secondary | ICD-10-CM | POA: Diagnosis present

## 2023-06-14 DIAGNOSIS — Z87891 Personal history of nicotine dependence: Secondary | ICD-10-CM | POA: Diagnosis not present

## 2023-06-14 DIAGNOSIS — M1990 Unspecified osteoarthritis, unspecified site: Secondary | ICD-10-CM | POA: Diagnosis not present

## 2023-06-14 DIAGNOSIS — R7303 Prediabetes: Secondary | ICD-10-CM | POA: Insufficient documentation

## 2023-06-14 HISTORY — PX: BRONCHIAL NEEDLE ASPIRATION BIOPSY: SHX5106

## 2023-06-14 HISTORY — PX: BRONCHIAL BIOPSY: SHX5109

## 2023-06-14 HISTORY — DX: Anemia, unspecified: D64.9

## 2023-06-14 HISTORY — DX: Dyspnea, unspecified: R06.00

## 2023-06-14 HISTORY — PX: BRONCHIAL BRUSHINGS: SHX5108

## 2023-06-14 HISTORY — PX: HEMOSTASIS CONTROL: SHX6838

## 2023-06-14 HISTORY — PX: VIDEO BRONCHOSCOPY WITH ENDOBRONCHIAL ULTRASOUND: SHX6177

## 2023-06-14 HISTORY — DX: Prediabetes: R73.03

## 2023-06-14 SURGERY — BRONCHOSCOPY, WITH BIOPSY USING ELECTROMAGNETIC NAVIGATION
Anesthesia: General | Laterality: Bilateral

## 2023-06-14 MED ORDER — PROPOFOL 10 MG/ML IV BOLUS
INTRAVENOUS | Status: DC | PRN
Start: 2023-06-14 — End: 2023-06-14
  Administered 2023-06-14: 150 mg via INTRAVENOUS

## 2023-06-14 MED ORDER — OXYCODONE HCL 5 MG/5ML PO SOLN: 5.0000 mg | Freq: Once | ORAL | Status: DC | PRN

## 2023-06-14 MED ORDER — ROCURONIUM BROMIDE 10 MG/ML (PF) SYRINGE
PREFILLED_SYRINGE | INTRAVENOUS | Status: DC | PRN
  Administered 2023-06-14: 60 mg via INTRAVENOUS

## 2023-06-14 MED ORDER — FENTANYL CITRATE (PF) 100 MCG/2ML IJ SOLN: 25.0000 ug | INTRAMUSCULAR | Status: DC | PRN

## 2023-06-14 MED ORDER — ACETAMINOPHEN 500 MG PO TABS: 1000.0000 mg | ORAL_TABLET | Freq: Once | ORAL | Status: AC

## 2023-06-14 MED ORDER — LIDOCAINE 2% (20 MG/ML) 5 ML SYRINGE
INTRAMUSCULAR | Status: DC | PRN
  Administered 2023-06-14 (×2): 40 mg via INTRAVENOUS

## 2023-06-14 MED ORDER — SODIUM CHLORIDE (PF) 0.9 % IJ SOLN
PREFILLED_SYRINGE | INTRAMUSCULAR | Status: DC | PRN
  Administered 2023-06-14: 4 mL

## 2023-06-14 MED ORDER — DEXAMETHASONE SODIUM PHOSPHATE 10 MG/ML IJ SOLN
INTRAMUSCULAR | Status: DC | PRN
  Administered 2023-06-14: 10 mg via INTRAVENOUS

## 2023-06-14 MED ORDER — ONDANSETRON HCL 4 MG/2ML IJ SOLN
INTRAMUSCULAR | Status: DC | PRN
  Administered 2023-06-14: 4 mg via INTRAVENOUS

## 2023-06-14 MED ORDER — EPINEPHRINE 1 MG/10ML IJ SOSY
PREFILLED_SYRINGE | INTRAMUSCULAR | Status: AC
  Filled 2023-06-14: qty 10

## 2023-06-14 MED ORDER — ACETAMINOPHEN 500 MG PO TABS
ORAL_TABLET | ORAL | Status: AC
  Administered 2023-06-14: 1000 mg via ORAL
  Filled 2023-06-14: qty 2

## 2023-06-14 MED ORDER — PROPOFOL 500 MG/50ML IV EMUL
INTRAVENOUS | Status: DC | PRN
  Administered 2023-06-14: 150 ug/kg/min via INTRAVENOUS

## 2023-06-14 MED ORDER — ONDANSETRON HCL 4 MG/2ML IJ SOLN: 4.0000 mg | Freq: Once | INTRAMUSCULAR | Status: DC | PRN

## 2023-06-14 MED ORDER — CHLORHEXIDINE GLUCONATE 0.12 % MT SOLN
OROMUCOSAL | Status: AC
  Administered 2023-06-14: 15 mL
  Filled 2023-06-14: qty 15

## 2023-06-14 MED ORDER — LACTATED RINGERS IV SOLN: INTRAVENOUS | Status: DC

## 2023-06-14 MED ORDER — SUGAMMADEX SODIUM 200 MG/2ML IV SOLN
INTRAVENOUS | Status: DC | PRN
  Administered 2023-06-14: 200 mg via INTRAVENOUS

## 2023-06-14 MED ORDER — PHENYLEPHRINE HCL-NACL 20-0.9 MG/250ML-% IV SOLN
INTRAVENOUS | Status: DC | PRN
  Administered 2023-06-14: 25 ug/min via INTRAVENOUS

## 2023-06-14 MED ORDER — OXYCODONE HCL 5 MG PO TABS: 5.0000 mg | ORAL_TABLET | Freq: Once | ORAL | Status: DC | PRN

## 2023-06-14 NOTE — Interval H&P Note (Signed)
History and Physical Interval Note:  06/14/2023 9:49 AM  Miranda Perkins  has presented today for surgery, with the diagnosis of LEFT HILAR  MASS,RIGHT UPPER LOPE NODULE.  The various methods of treatment have been discussed with the patient and family. After consideration of risks, benefits and other options for treatment, the patient has consented to  Procedure(s): ROBOTIC ASSISTED NAVIGATIONAL BRONCHOSCOPY (Bilateral) VIDEO BRONCHOSCOPY WITH ENDOBRONCHIAL ULTRASOUND (Bilateral) as a surgical intervention.  The patient's history has been reviewed, patient examined, no change in status, stable for surgery.  I have reviewed the patient's chart and labs.  Questions were answered to the patient's satisfaction.     Leslye Peer

## 2023-06-14 NOTE — Telephone Encounter (Signed)
Needs palliative orders from PCP

## 2023-06-14 NOTE — Transitions of Care (Post Inpatient/ED Visit) (Signed)
06/14/2023  Name: Miranda Perkins MRN: [id number] DOB: [birth year]  Today's TOC FU Call Status: Today's TOC FU Call Status:: Successful TOC FU Call Completed TOC FU Call Complete Date: 06/14/23  Transition Care Management Follow-up Telephone Call Date of Discharge: 06/13/23 Discharge Facility: [facility]) Type of Discharge: Inpatient Admission Primary Inpatient Discharge Diagnosis:: abd pain How have you been since you were released from the hospital?: Better Any questions or concerns?: No  Items Reviewed: Did you receive and understand the discharge instructions provided?: No Medications obtained,verified, and reconciled?: Yes (Medications Reviewed) Any new allergies since your discharge?: No Dietary orders reviewed?: Yes Do you have support at home?: Yes People in Home: spouse, grandchild(ren)  Medications Reviewed Today: Medications Reviewed Today     Reviewed by Karena Addison, LPN (Licensed Practical Nurse) on 06/14/23 at 1527  Med List Status: <None>   Medication Order Taking? Sig Documenting Provider Last Dose Status Informant  albuterol (VENTOLIN HFA) 108 (90 Base) MCG/ACT inhaler [id number] No Inhale 2 puffs into the lungs every 6 (six) hours as needed. Daphine Deutscher Mary-Margaret, FNP More than a month Active Self, Pharmacy Records  furosemide (LASIX) 40 MG tablet [id number] No Take 1.5 tablets (60 mg total) by mouth daily. Antoine Poche, MD 06/13/2023 Active Self, Pharmacy Records           Med Note Jola Schmidt   Fri Jun 11, 2023  7:21 AM) Patient states it has been over 4 weeks since last dose. Is supposed to be taking, but can not keep it down.   HYDROmorphone (DILAUDID) 2 MG tablet [id number] No Take 1 tablet (2 mg total) by mouth every 3 (three) hours as needed for severe pain. [name], MD 06/13/2023 2100 Active Self, Pharmacy Records  ondansetron [facility]) 8 MG tablet [id number] No Take 1 tablet (8 mg total) by mouth every 8 (eight) hours as  needed for nausea or vomiting. [name], MD 06/12/2023 Active Self, Pharmacy Records  progesterone (PROMETRIUM) 200 MG capsule [id number] No Nightly 10 days every 3 months  Patient not taking: Reported on 06/11/2023   Lazaro Arms, MD Not Taking Active Self, Pharmacy Records           Med Note Mayford Knife, [place] S   Fri May 21, 2023 10:55 AM) Pt has not started yet.  promethazine (PHENERGAN) 25 MG suppository [id number] No Place 1 suppository (25 mg total) rectally every 6 (six) hours as needed for nausea or vomiting. [name], MD 06/11/2023 Active Self, Pharmacy Records  promethazine (PHENERGAN) 25 MG tablet [id number] No Take 1 tablet (25 mg total) by mouth every 6 (six) hours as needed for nausea or vomiting. [name], MD 06/13/2023 2100 Active Self, Pharmacy Records  Med List Note Benjamine Mola 05/07/23 1326): [employer] patient            Home Care and Equipment/Supplies: Were Home Health Services Ordered?: NA Any new equipment or medical supplies ordered?: NA  Functional Questionnaire: Do you need assistance with bathing/showering or dressing?: No Do you need assistance with meal preparation?: No Do you need assistance with eating?: No Do you have difficulty maintaining continence: No Do you need assistance with getting out of bed/getting out of a chair/moving?: No Do you have difficulty managing or taking your medications?: No  Follow up appointments reviewed: PCP Follow-up appointment confirmed?: No (no appt avail , sent message to staff to schedule) Specialist Hospital Follow-up appointment confirmed?: Yes Date of Specialist follow-up appointment?: 06/14/23 Follow-Up Specialty Provider:: GI  Do you need transportation to your follow-up appointment?: No Do you understand care options if your condition(s) worsen?: Yes-patient verbalized understanding    SIGNATURE Karena Addison, LPN [facility] Nurse Health Advisor Direct Dial [phone]

## 2023-06-14 NOTE — Op Note (Signed)
Video Bronchoscopy with Robotic Assisted Bronchoscopic Navigation and Endobronchial Ultrasound Procedure Note  Date of Operation: 06/14/2023   Pre-op Diagnosis: Left hilar mass, right upper lobe groundglass nodule  Post-op Diagnosis: Left hilar mass.  No right upper lobe nodule was identified on imaging  Surgeon: Levy Pupa  Assistants: None  Anesthesia: General endotracheal anesthesia  Operation: Flexible video fiberoptic bronchoscopy with robotic assistance and biopsies.  Estimated Blood Loss: Minimal  Complications: None  Indications and History: Miranda Perkins is a 62 y.o. female with history of minimal tobacco use.  She is under evaluation for a left hilar mass, adrenal mass.  Imaging has confirmed these findings as well as a small right upper lobe groundglass pulmonary nodule.  Recommendation made to achieve a tissue diagnosis via robotic assisted navigational bronchoscopy and endobronchial ultrasound. The risks, benefits, complications, treatment options and expected outcomes were discussed with the patient.  The possibilities of pneumothorax, pneumonia, reaction to medication, pulmonary aspiration, perforation of a viscus, bleeding, failure to diagnose a condition and creating a complication requiring transfusion or operation were discussed with the patient who freely signed the consent.    Description of Procedure: The patient was seen in the Preoperative Area, was examined and was deemed appropriate to proceed.  The patient was taken to [facility] endoscopy room 3, identified as [facility] and the procedure verified as Flexible Video Fiberoptic Bronchoscopy.  A Time Out was held and the above information confirmed.   Prior to the date of the procedure a high-resolution CT scan of the chest was performed. Utilizing ION software program a virtual tracheobronchial tree was generated to allow the creation of distinct navigation pathways to the patient's parenchymal abnormalities.  After being taken to the operating room general anesthesia was initiated and the patient  was orally intubated. The video fiberoptic bronchoscope was introduced via the endotracheal tube and a general inspection was performed which showed normal right side anatomy.  The left upper lobe airways were patent.  There was erythema and narrowing of the left lower lobe airways.  There was a small rounded white friable endobronchial lesion at the orifice of the left lower lobe airway.  Endobronchial biopsies were performed at this location to be sent for pathology. The robotic catheter was then inserted into patient's endotracheal tube.   Target #1 right upper lobe groundglass pulmonary nodule: The distinct navigation pathways prepared prior to this procedure were then utilized to navigate to patient's lesion identified on CT scan. The robotic catheter was secured into place and the vision probe was withdrawn.  CIOs three-dimensional imaging was performed to localize the catheter and pulmonary nodule.  On Cios-spin the nodule was no longer easily identified.  Transbronchial brushings were performed at this location to be sent for cytology.  EBUS: The robotic scope was then withdrawn and the endobronchial ultrasound was used to identify and characterize the peritracheal, hilar and bronchial lymph nodes. Inspection showed a large left hilar mass at the distal left mainstem bronchus extending into the left lower lobe bronchus. Using real-time ultrasound guidance Wang needle biopsies were taken from the left hilar mass and were sent for cytology.   At the end of the procedure a general airway inspection was performed and there was minimal active bleeding. The bronchoscope was removed.  The patient tolerated the procedure well. There was no significant blood loss and there were no obvious complications. A post-procedural chest x-ray is pending.  Samples Target #1: 1. Transbronchial brushings from right upper lobe  (nodule not  visualized on Cios)  EBUS samples: 1. Wang needle biopsies from left hilar mass  Endobronchial samples: 1.  Left lower lobe endobronchial biopsies, endobronchial lesion  Plans:  The patient will be discharged from the PACU to home when recovered from anesthesia and after chest x-ray is reviewed. We will review the cytology, pathology and microbiology results with the patient when they become available. Outpatient followup will be with Dr. Delton Coombes and Dr. Ellin Saba.    Levy Pupa, MD, PhD 06/14/2023, 12:30 PM [facility] [phone] or if no answer before 7:00PM call [phone] For any issues after 7:00PM please call eLink [phone]

## 2023-06-14 NOTE — Anesthesia Procedure Notes (Signed)
Procedure Name: Intubation Date/Time: 06/14/2023 11:19 AM  Performed by: Samara Deist, CRNAPre-anesthesia Checklist: Patient identified, Emergency Drugs available, Suction available, Patient being monitored and Timeout performed Patient Re-evaluated:Patient Re-evaluated prior to induction Oxygen Delivery Method: Circle system utilized Preoxygenation: Pre-oxygenation with 100% oxygen Induction Type: IV induction Ventilation: Mask ventilation without difficulty Laryngoscope Size: 4 and Glidescope Grade View: Grade II Tube type: Oral Tube size: 8.5 mm Number of attempts: 2 Airway Equipment and Method: Video-laryngoscopy Placement Confirmation: ETT inserted through vocal cords under direct vision, positive ETCO2 and breath sounds checked- equal and bilateral Secured at: 23 cm Tube secured with: Tape Dental Injury: Teeth and Oropharynx as per pre-operative assessment

## 2023-06-14 NOTE — Discharge Instructions (Addendum)
Flexible Bronchoscopy, Care After This sheet gives you information about how to care for yourself after your test. Your doctor may also give you more specific instructions. If you have problems or questions, contact your doctor. Follow these instructions at home: Eating and drinking When your numbness is gone and your cough and gag reflexes have come back, you may: Eat only soft foods. Slowly drink liquids. The day after the test, go back to your normal diet. Driving Do not drive for 24 hours if you were given a medicine to help you relax (sedative). Do not drive or use heavy machinery while taking prescription pain medicine. General instructions  Take over-the-counter and prescription medicines only as told by your doctor. Return to your normal activities as told. Ask what activities are safe for you. Do not use any products that have nicotine or tobacco in them. This includes cigarettes and e-cigarettes. If you need help quitting, ask your doctor. Keep all follow-up visits as told by your doctor. This is important. It is very important if you had a tissue sample (biopsy) taken. Get help right away if: You have shortness of breath that gets worse. You get light-headed. You feel like you are going to pass out (faint). You have chest pain. You cough up: More than a little blood. More blood than before. Summary Do not eat or drink anything (not even water) for 2 hours after your test, or until your numbing medicine wears off. Do not use cigarettes. Do not use e-cigarettes. Get help right away if you have chest pain.  Please call our office with any questions or concerns.  [phone].   This information is not intended to replace advice given to you by your health care provider. Make sure you discuss any questions you have with your health care provider. Document Released: 08/16/2009 Document Revised: 10/01/2017 Document Reviewed: 11/06/2016 Elsevier Patient Education  2020  Elsevier Inc.  

## 2023-06-14 NOTE — Telephone Encounter (Signed)
Referral placed.  I assume that is what they are requesting?  I haven't seen the pt. Looks like she just had surgery today with Dr Delton Coombes

## 2023-06-14 NOTE — Anesthesia Postprocedure Evaluation (Signed)
Anesthesia Post Note  Patient: Miranda Perkins  Procedure(s) Performed: ROBOTIC ASSISTED NAVIGATIONAL BRONCHOSCOPY (Bilateral) VIDEO BRONCHOSCOPY WITH ENDOBRONCHIAL ULTRASOUND (Bilateral) BRONCHIAL BRUSHINGS BRONCHIAL NEEDLE ASPIRATION BIOPSIES BRONCHIAL BIOPSIES HEMOSTASIS CONTROL     Patient location during evaluation: PACU Anesthesia Type: General Level of consciousness: awake and alert Pain management: pain level controlled Vital Signs Assessment: post-procedure vital signs reviewed and stable Respiratory status: spontaneous breathing, nonlabored ventilation and respiratory function stable Cardiovascular status: stable and blood pressure returned to baseline Anesthetic complications: no   No notable events documented.  Last Vitals:  Vitals:   06/14/23 1245 06/14/23 1300  BP: 129/79 136/89  Pulse: (!) 101 99  Resp: 20 14  Temp:  36.7 C  SpO2: 96% 95%    Last Pain:  Vitals:   06/14/23 1300  PainSc: 0-No pain                 Beryle Lathe

## 2023-06-14 NOTE — Anesthesia Preprocedure Evaluation (Addendum)
Anesthesia Evaluation  Patient identified by MRN, date of birth, ID band Patient awake    Reviewed: Allergy & Precautions, NPO status , Patient's Chart, lab work & pertinent test results  History of Anesthesia Complications Negative for: history of anesthetic complications  Airway Mallampati: II  TM Distance: >3 FB Neck ROM: Full    Dental  (+) Dental Advisory Given, Missing   Pulmonary former smoker  Left hilar mass    Pulmonary exam normal        Cardiovascular + dysrhythmias Atrial Fibrillation  Rhythm:Regular Rate:Tachycardia     Neuro/Psych CVA, No Residual Symptoms  negative psych ROS   GI/Hepatic negative GI ROS, Neg liver ROS,,,  Endo/Other   Na 132 Obesity Pre-DM   Renal/GU negative Renal ROS     Musculoskeletal  (+) Arthritis ,    Abdominal   Peds  Hematology  (+) Blood dyscrasia, anemia   Anesthesia Other Findings   Reproductive/Obstetrics                             Anesthesia Physical Anesthesia Plan  ASA: 3  Anesthesia Plan: General   Post-op Pain Management: Tylenol PO (pre-op)* and Minimal or no pain anticipated   Induction: Intravenous  PONV Risk Score and Plan: 3 and Treatment may vary due to age or medical condition, Ondansetron, Dexamethasone and Midazolam  Airway Management Planned: Oral ETT  Additional Equipment: None  Intra-op Plan:   Post-operative Plan: Extubation in OR  Informed Consent: I have reviewed the patients History and Physical, chart, labs and discussed the procedure including the risks, benefits and alternatives for the proposed anesthesia with the patient or authorized representative who has indicated his/her understanding and acceptance.     Dental advisory given  Plan Discussed with: CRNA and Anesthesiologist  Anesthesia Plan Comments:        Anesthesia Quick Evaluation

## 2023-06-14 NOTE — Transfer of Care (Signed)
Immediate Anesthesia Transfer of Care Note  Patient: Miranda Perkins  Procedure(s) Performed: ROBOTIC ASSISTED NAVIGATIONAL BRONCHOSCOPY (Bilateral) VIDEO BRONCHOSCOPY WITH ENDOBRONCHIAL ULTRASOUND (Bilateral) BRONCHIAL BRUSHINGS BRONCHIAL NEEDLE ASPIRATION BIOPSIES BRONCHIAL BIOPSIES HEMOSTASIS CONTROL  Patient Location: PACU  Anesthesia Type:General  Level of Consciousness: awake, alert , and oriented  Airway & Oxygen Therapy: Patient Spontanous Breathing and Patient connected to nasal cannula oxygen  Post-op Assessment: Report given to RN and Post -op Vital signs reviewed and stable  Post vital signs: Reviewed and stable  Last Vitals:  Vitals Value Taken Time  BP 155/91 06/14/23 1230  Temp 36.7 C 06/14/23 1230  Pulse 101 06/14/23 1234  Resp 29 06/14/23 1234  SpO2 97 % 06/14/23 1234  Vitals shown include unfiled device data.  Last Pain:  Vitals:   06/14/23 1230  PainSc: 0-No pain         Complications: No notable events documented.

## 2023-06-16 ENCOUNTER — Inpatient Hospital Stay

## 2023-06-16 ENCOUNTER — Ambulatory Visit (HOSPITAL_COMMUNITY)
Admission: RE | Admit: 2023-06-16 | Discharge: 2023-06-16 | Disposition: A | Discharge status: Home / Self Care | Source: Ambulatory Visit | Attending: Hematology | Admitting: Hematology

## 2023-06-16 ENCOUNTER — Other Ambulatory Visit: Payer: Self-pay | Admitting: *Deleted

## 2023-06-16 ENCOUNTER — Telehealth: Payer: Self-pay | Admitting: *Deleted

## 2023-06-16 ENCOUNTER — Other Ambulatory Visit: Payer: Self-pay

## 2023-06-16 VITALS — BP 116/74 | HR 103 | Temp 97.3°F | Resp 20

## 2023-06-16 DIAGNOSIS — C3490 Malignant neoplasm of unspecified part of unspecified bronchus or lung: Secondary | ICD-10-CM | POA: Insufficient documentation

## 2023-06-16 LAB — CBC WITH DIFFERENTIAL/PLATELET
Abs Immature Granulocytes: 0.05 10*3/uL (ref 0.00–0.07)
Basophils Absolute: 0 10*3/uL (ref 0.0–0.1)
Basophils Relative: 0 %
Eosinophils Absolute: 0 10*3/uL (ref 0.0–0.5)
Eosinophils Relative: 0 %
HCT: 35.2 % — ABNORMAL LOW (ref 36.0–46.0)
Hemoglobin: 11.2 g/dL — ABNORMAL LOW (ref 12.0–15.0)
Immature Granulocytes: 1 %
Lymphocytes Relative: 19 %
Lymphs Abs: 1.9 10*3/uL (ref 0.7–4.0)
MCH: 26.7 pg (ref 26.0–34.0)
MCHC: 31.8 g/dL (ref 30.0–36.0)
MCV: 84 fL (ref 80.0–100.0)
Monocytes Absolute: 1.1 10*3/uL — ABNORMAL HIGH (ref 0.1–1.0)
Monocytes Relative: 11 %
Neutro Abs: 6.8 10*3/uL (ref 1.7–7.7)
Neutrophils Relative %: 69 %
Platelets: 495 10*3/uL — ABNORMAL HIGH (ref 150–400)
RBC: 4.19 MIL/uL (ref 3.87–5.11)
RDW: 14.6 % (ref 11.5–15.5)
WBC: 9.8 10*3/uL (ref 4.0–10.5)
nRBC: 0 % (ref 0.0–0.2)

## 2023-06-16 LAB — MAGNESIUM: Magnesium: 1.8 mg/dL (ref 1.7–2.4)

## 2023-06-16 LAB — COMPREHENSIVE METABOLIC PANEL
ALT: 59 U/L — ABNORMAL HIGH (ref 0–44)
AST: 31 U/L (ref 15–41)
Albumin: 2.9 g/dL — ABNORMAL LOW (ref 3.5–5.0)
Alkaline Phosphatase: 102 U/L (ref 38–126)
Anion gap: 11 (ref 5–15)
BUN: 11 mg/dL (ref 8–23)
CO2: 25 mmol/L (ref 22–32)
Calcium: 8.6 mg/dL — ABNORMAL LOW (ref 8.9–10.3)
Chloride: 98 mmol/L (ref 98–111)
Creatinine, Ser: 0.75 mg/dL (ref 0.44–1.00)
GFR, Estimated: >60 mL/min (ref >60)
Glucose, Bld: 100 mg/dL — ABNORMAL HIGH (ref 70–99)
Potassium: 3 mmol/L — ABNORMAL LOW (ref 3.5–5.1)
Sodium: 134 mmol/L — ABNORMAL LOW (ref 135–145)
Total Bilirubin: 0.9 mg/dL (ref 0.3–1.2)
Total Protein: 6.8 g/dL (ref 6.5–8.1)

## 2023-06-16 MED ORDER — GADOBUTROL 1 MMOL/ML IV SOLN
10.0000 mL | Freq: Once | INTRAVENOUS | Status: AC | PRN
  Administered 2023-06-16: 10 mL via INTRAVENOUS

## 2023-06-16 MED ORDER — SCOPOLAMINE 1 MG/3DAYS TD PT72
1.0000 | MEDICATED_PATCH | Freq: Once | TRANSDERMAL | Status: DC
  Administered 2023-06-16: 1.5 mg via TRANSDERMAL
  Filled 2023-06-16: qty 1

## 2023-06-16 MED ORDER — FENTANYL 25 MCG/HR TD PT72
1.0000 | MEDICATED_PATCH | TRANSDERMAL | 0 refills | Status: DC
Start: 2023-06-16 — End: 2023-06-21

## 2023-06-16 MED ORDER — HYDROMORPHONE HCL 1 MG/ML IJ SOLN
2.0000 mg | Freq: Once | INTRAMUSCULAR | Status: AC
  Administered 2023-06-16: 2 mg via INTRAVENOUS
  Filled 2023-06-16: qty 2

## 2023-06-16 MED ORDER — ONDANSETRON HCL 4 MG/2ML IJ SOLN
8.0000 mg | Freq: Once | INTRAMUSCULAR | Status: AC
  Administered 2023-06-16: 8 mg via INTRAVENOUS
  Filled 2023-06-16: qty 4

## 2023-06-16 MED ORDER — SCOPOLAMINE 1 MG/3DAYS TD PT72: 1.0000 | MEDICATED_PATCH | TRANSDERMAL | 1 refills | Status: DC

## 2023-06-16 MED ORDER — POTASSIUM CHLORIDE IN NACL 20-0.9 MEQ/L-% IV SOLN
Freq: Once | INTRAVENOUS | Status: AC
  Filled 2023-06-16: qty 1000

## 2023-06-16 MED ORDER — MAGNESIUM SULFATE 2 GM/50ML IV SOLN
2.0000 g | Freq: Once | INTRAVENOUS | Status: AC
  Administered 2023-06-16: 2 g via INTRAVENOUS
  Filled 2023-06-16: qty 50

## 2023-06-16 NOTE — Progress Notes (Signed)
Patient presented today for nausea/vomiting. Unable to eat by mouth, severe fatigue. Patient her to get hyrdration fluids, magnesium potassium, zofran and dilaudid.    The above given per orders. Patient tolerated it well without problems. Vitals stable and discharged home from clinic via wheelchair. Follow up as scheduled.

## 2023-06-16 NOTE — Patient Instructions (Signed)
[facility]  Discharge Instructions: Thank you for choosing [facility] to provide your oncology and hematology care.  If you have a lab appointment with the Cancer Center - please note that after April 8th, 2024, all labs will be drawn in the cancer center.  You do not have to check in or register with the main entrance as you have in the past but will complete your check-in in the cancer center.  Wear comfortable clothing and clothing appropriate for easy access to any Portacath or PICC line.   We strive to give you quality time with your provider. You may need to reschedule your appointment if you arrive late (15 or more minutes).  Arriving late affects you and other patients whose appointments are after yours.  Also, if you miss three or more appointments without notifying the office, you may be dismissed from the clinic at the provider's discretion.      For prescription refill requests, have your pharmacy contact our office and allow 72 hours for refills to be completed.    Today you received hydration fluids with potassium, magnesium , zofran, dilaudid for pain      To help prevent nausea and vomiting after your treatment, we encourage you to take your nausea medication as directed.  BELOW ARE SYMPTOMS THAT SHOULD BE REPORTED IMMEDIATELY: *FEVER GREATER THAN 100.4 F (38 C) OR HIGHER *CHILLS OR SWEATING *NAUSEA AND VOMITING THAT IS NOT CONTROLLED WITH YOUR NAUSEA MEDICATION *UNUSUAL SHORTNESS OF BREATH *UNUSUAL BRUISING OR BLEEDING *URINARY PROBLEMS (pain or burning when urinating, or frequent urination) *BOWEL PROBLEMS (unusual diarrhea, constipation, pain near the anus) TENDERNESS IN MOUTH AND THROAT WITH OR WITHOUT PRESENCE OF ULCERS (sore throat, sores in mouth, or a toothache) UNUSUAL RASH, SWELLING OR PAIN  UNUSUAL VAGINAL DISCHARGE OR ITCHING   Items with * indicate a potential emergency and should be followed up as soon as possible or go to  the Emergency Department if any problems should occur.  Please show the CHEMOTHERAPY ALERT CARD or IMMUNOTHERAPY ALERT CARD at check-in to the Emergency Department and triage nurse.  Should you have questions after your visit or need to cancel or reschedule your appointment, please contact [facility] AT [facility] [phone]  and follow the prompts.  Office hours are 8:00 a.m. to 4:30 p.m. Monday - Friday. Please note that voicemails left after 4:00 p.m. may not be returned until the following business day.  We are closed weekends and major holidays. You have access to a nurse at all times for urgent questions. Please call the main number to the clinic [phone] and follow the prompts.  For any non-urgent questions, you may also contact your provider using MyChart. We now offer e-Visits for anyone 3 and older to request care online for non-urgent symptoms. For details visit [email or web address].   Also download the MyChart app! Go to the app store, search "MyChart", open the app, select [place], and log in with your MyChart username and password.

## 2023-06-16 NOTE — Telephone Encounter (Signed)
Patient called in excruciating pain and nausea.  Dilaudid is not effective in managing her pain, nor are the phenergan suppositories for her nausea.  She is unable to keep anything down, including fluids.  Pain is a 10/10 in chest secondary to her cancer.  Per Dr. Ellin Saba, we will bring her in every other day for house fluids, dilaudid 2 mg and 8 mg zofran with labs.  Script will be sent for Fentanyl patch Q 72 hours for pain.  Patient aware and agrees to plan.  Pathology is pending from bronchoscopy.  PET images reviewed by Dr. Ellin Saba.

## 2023-06-17 ENCOUNTER — Encounter: Payer: Self-pay | Admitting: *Deleted

## 2023-06-17 ENCOUNTER — Telehealth: Payer: Self-pay | Admitting: Emergency Medicine

## 2023-06-17 NOTE — Telephone Encounter (Signed)
Brushings are negative on cytology, await EBUS results.  Then we will call her

## 2023-06-17 NOTE — Progress Notes (Signed)
Received call report on abnormal MRI brain. Dr. Ellin Saba aware of results.

## 2023-06-18 ENCOUNTER — Encounter: Payer: Self-pay | Admitting: Hematology

## 2023-06-18 ENCOUNTER — Other Ambulatory Visit

## 2023-06-18 ENCOUNTER — Inpatient Hospital Stay

## 2023-06-18 VITALS — BP 132/55 | HR 96 | Temp 97.6°F | Resp 18

## 2023-06-18 DIAGNOSIS — C3490 Malignant neoplasm of unspecified part of unspecified bronchus or lung: Secondary | ICD-10-CM

## 2023-06-18 LAB — COMPREHENSIVE METABOLIC PANEL
ALT: 73 U/L — ABNORMAL HIGH (ref 0–44)
AST: 34 U/L (ref 15–41)
Albumin: 3 g/dL — ABNORMAL LOW (ref 3.5–5.0)
Alkaline Phosphatase: 110 U/L (ref 38–126)
Anion gap: 13 (ref 5–15)
BUN: 14 mg/dL (ref 8–23)
CO2: 25 mmol/L (ref 22–32)
Calcium: 8.7 mg/dL — ABNORMAL LOW (ref 8.9–10.3)
Chloride: 97 mmol/L — ABNORMAL LOW (ref 98–111)
Creatinine, Ser: 0.82 mg/dL (ref 0.44–1.00)
GFR, Estimated: >60 mL/min (ref >60)
Glucose, Bld: 124 mg/dL — ABNORMAL HIGH (ref 70–99)
Potassium: 3.1 mmol/L — ABNORMAL LOW (ref 3.5–5.1)
Sodium: 135 mmol/L (ref 135–145)
Total Bilirubin: 1.3 mg/dL — ABNORMAL HIGH (ref 0.3–1.2)
Total Protein: 6.7 g/dL (ref 6.5–8.1)

## 2023-06-18 LAB — CBC WITH DIFFERENTIAL/PLATELET
Abs Immature Granulocytes: 0.04 10*3/uL (ref 0.00–0.07)
Basophils Absolute: 0 10*3/uL (ref 0.0–0.1)
Basophils Relative: 0 %
Eosinophils Absolute: 0 10*3/uL (ref 0.0–0.5)
Eosinophils Relative: 0 %
HCT: 37.2 % (ref 36.0–46.0)
Hemoglobin: 11.9 g/dL — ABNORMAL LOW (ref 12.0–15.0)
Immature Granulocytes: 0 %
Lymphocytes Relative: 16 %
Lymphs Abs: 1.9 10*3/uL (ref 0.7–4.0)
MCH: 26.7 pg (ref 26.0–34.0)
MCHC: 32 g/dL (ref 30.0–36.0)
MCV: 83.6 fL (ref 80.0–100.0)
Monocytes Absolute: 1.1 10*3/uL — ABNORMAL HIGH (ref 0.1–1.0)
Monocytes Relative: 9 %
Neutro Abs: 8.9 10*3/uL — ABNORMAL HIGH (ref 1.7–7.7)
Neutrophils Relative %: 75 %
Platelets: 509 10*3/uL — ABNORMAL HIGH (ref 150–400)
RBC: 4.45 MIL/uL (ref 3.87–5.11)
RDW: 14.6 % (ref 11.5–15.5)
WBC: 11.9 10*3/uL — ABNORMAL HIGH (ref 4.0–10.5)
nRBC: 0 % (ref 0.0–0.2)

## 2023-06-18 LAB — MAGNESIUM: Magnesium: 2.1 mg/dL (ref 1.7–2.4)

## 2023-06-18 MED ORDER — ONDANSETRON HCL 4 MG/2ML IJ SOLN
8.0000 mg | Freq: Once | INTRAMUSCULAR | Status: AC
  Administered 2023-06-18: 8 mg via INTRAVENOUS
  Filled 2023-06-18: qty 4

## 2023-06-18 MED ORDER — HYDROMORPHONE HCL 1 MG/ML IJ SOLN
2.0000 mg | Freq: Once | INTRAMUSCULAR | Status: AC
  Administered 2023-06-18: 2 mg via INTRAVENOUS
  Filled 2023-06-18: qty 2

## 2023-06-18 MED ORDER — SODIUM CHLORIDE 0.9% FLUSH
10.0000 mL | Freq: Once | INTRAVENOUS | Status: AC | PRN
  Administered 2023-06-18: 10 mL

## 2023-06-18 MED ORDER — MAGNESIUM SULFATE 2 GM/50ML IV SOLN
2.0000 g | Freq: Once | INTRAVENOUS | Status: AC
  Administered 2023-06-18: 2 g via INTRAVENOUS
  Filled 2023-06-18: qty 50

## 2023-06-18 MED ORDER — POTASSIUM CHLORIDE IN NACL 20-0.9 MEQ/L-% IV SOLN
Freq: Once | INTRAVENOUS | Status: AC
  Filled 2023-06-18: qty 1000

## 2023-06-18 MED ORDER — HEPARIN SOD (PORK) LOCK FLUSH 100 UNIT/ML IV SOLN
500.0000 [IU] | Freq: Once | INTRAVENOUS | Status: AC | PRN
  Administered 2023-06-18: 500 [IU]

## 2023-06-18 NOTE — Telephone Encounter (Signed)
Spoke with the patient to update her.  The left hilar mass biopsies are still not resulted.  I will call her when they are available

## 2023-06-18 NOTE — Progress Notes (Addendum)
Pt present today for fluids, Zofran, and Dilaudid 2 mg IV per provider's order. Vital signs stable, pt c/o nausea and vomiting, 7/10 back pain, and severe fatigue. Pt stated Fentanyl patch does not relieve her pain. Patient will return to the clinic this week for fluids and Dilaudid and office visit with Dr.K on 06/23/23.  Discharged from clinic via wheelchair in stable condition. Alert and oriented x 3. F/U with [facility] as scheduled.

## 2023-06-20 ENCOUNTER — Encounter (HOSPITAL_COMMUNITY): Payer: Self-pay | Admitting: Emergency Medicine

## 2023-06-20 ENCOUNTER — Emergency Department (HOSPITAL_COMMUNITY)

## 2023-06-20 ENCOUNTER — Inpatient Hospital Stay (HOSPITAL_COMMUNITY)
Admission: EM | Admit: 2023-06-20 | Discharge: 2023-06-26 | DRG: 357 | Disposition: A | Discharge status: Home / Self Care | Attending: Family Medicine | Admitting: Family Medicine

## 2023-06-20 ENCOUNTER — Other Ambulatory Visit: Payer: Self-pay

## 2023-06-20 DIAGNOSIS — Z887 Allergy status to serum and vaccine status: Secondary | ICD-10-CM | POA: Diagnosis not present

## 2023-06-20 DIAGNOSIS — A0471 Enterocolitis due to Clostridium difficile, recurrent: Secondary | ICD-10-CM | POA: Diagnosis not present

## 2023-06-20 DIAGNOSIS — D649 Anemia, unspecified: Secondary | ICD-10-CM | POA: Diagnosis not present

## 2023-06-20 DIAGNOSIS — C3492 Malignant neoplasm of unspecified part of left bronchus or lung: Secondary | ICD-10-CM | POA: Diagnosis not present

## 2023-06-20 DIAGNOSIS — E6609 Other obesity due to excess calories: Secondary | ICD-10-CM | POA: Diagnosis not present

## 2023-06-20 DIAGNOSIS — U099 Post covid-19 condition, unspecified: Secondary | ICD-10-CM | POA: Diagnosis not present

## 2023-06-20 DIAGNOSIS — M8580 Other specified disorders of bone density and structure, unspecified site: Secondary | ICD-10-CM | POA: Diagnosis not present

## 2023-06-20 DIAGNOSIS — M7989 Other specified soft tissue disorders: Secondary | ICD-10-CM | POA: Diagnosis present

## 2023-06-20 DIAGNOSIS — Z85819 Personal history of malignant neoplasm of unspecified site of lip, oral cavity, and pharynx: Secondary | ICD-10-CM

## 2023-06-20 DIAGNOSIS — Z880 Allergy status to penicillin: Secondary | ICD-10-CM | POA: Diagnosis not present

## 2023-06-20 DIAGNOSIS — R197 Diarrhea, unspecified: Secondary | ICD-10-CM | POA: Diagnosis not present

## 2023-06-20 DIAGNOSIS — Z79899 Other long term (current) drug therapy: Secondary | ICD-10-CM

## 2023-06-20 DIAGNOSIS — Z91048 Other nonmedicinal substance allergy status: Secondary | ICD-10-CM

## 2023-06-20 DIAGNOSIS — Z87891 Personal history of nicotine dependence: Secondary | ICD-10-CM

## 2023-06-20 DIAGNOSIS — Z85828 Personal history of other malignant neoplasm of skin: Secondary | ICD-10-CM | POA: Diagnosis not present

## 2023-06-20 DIAGNOSIS — Z981 Arthrodesis status: Secondary | ICD-10-CM

## 2023-06-20 DIAGNOSIS — R590 Localized enlarged lymph nodes: Secondary | ICD-10-CM | POA: Diagnosis present

## 2023-06-20 DIAGNOSIS — A0472 Enterocolitis due to Clostridium difficile, not specified as recurrent: Secondary | ICD-10-CM | POA: Diagnosis present

## 2023-06-20 DIAGNOSIS — C7971 Secondary malignant neoplasm of right adrenal gland: Secondary | ICD-10-CM | POA: Diagnosis present

## 2023-06-20 DIAGNOSIS — E871 Hypo-osmolality and hyponatremia: Secondary | ICD-10-CM | POA: Diagnosis present

## 2023-06-20 DIAGNOSIS — R918 Other nonspecific abnormal finding of lung field: Secondary | ICD-10-CM | POA: Diagnosis present

## 2023-06-20 DIAGNOSIS — E876 Hypokalemia: Secondary | ICD-10-CM | POA: Diagnosis not present

## 2023-06-20 DIAGNOSIS — R112 Nausea with vomiting, unspecified: Principal | ICD-10-CM

## 2023-06-20 DIAGNOSIS — E878 Other disorders of electrolyte and fluid balance, not elsewhere classified: Secondary | ICD-10-CM | POA: Diagnosis not present

## 2023-06-20 DIAGNOSIS — K567 Ileus, unspecified: Principal | ICD-10-CM | POA: Diagnosis present

## 2023-06-20 DIAGNOSIS — C969 Malignant neoplasm of lymphoid, hematopoietic and related tissue, unspecified: Secondary | ICD-10-CM | POA: Diagnosis not present

## 2023-06-20 DIAGNOSIS — Z6836 Body mass index (BMI) 36.0-36.9, adult: Secondary | ICD-10-CM

## 2023-06-20 DIAGNOSIS — K56609 Unspecified intestinal obstruction, unspecified as to partial versus complete obstruction: Secondary | ICD-10-CM | POA: Diagnosis not present

## 2023-06-20 DIAGNOSIS — E66812 Obesity, class 2: Secondary | ICD-10-CM

## 2023-06-20 DIAGNOSIS — E041 Nontoxic single thyroid nodule: Secondary | ICD-10-CM | POA: Diagnosis not present

## 2023-06-20 DIAGNOSIS — E669 Obesity, unspecified: Secondary | ICD-10-CM

## 2023-06-20 DIAGNOSIS — Z9851 Tubal ligation status: Secondary | ICD-10-CM

## 2023-06-20 DIAGNOSIS — R0602 Shortness of breath: Secondary | ICD-10-CM

## 2023-06-20 DIAGNOSIS — C3490 Malignant neoplasm of unspecified part of unspecified bronchus or lung: Secondary | ICD-10-CM | POA: Diagnosis present

## 2023-06-20 DIAGNOSIS — Z8673 Personal history of transient ischemic attack (TIA), and cerebral infarction without residual deficits: Secondary | ICD-10-CM | POA: Diagnosis not present

## 2023-06-20 DIAGNOSIS — Z96652 Presence of left artificial knee joint: Secondary | ICD-10-CM | POA: Diagnosis not present

## 2023-06-20 DIAGNOSIS — Z808 Family history of malignant neoplasm of other organs or systems: Secondary | ICD-10-CM

## 2023-06-20 DIAGNOSIS — Z888 Allergy status to other drugs, medicaments and biological substances status: Secondary | ICD-10-CM

## 2023-06-20 LAB — LIPASE, BLOOD: Lipase: 45 U/L (ref 11–51)

## 2023-06-20 LAB — COMPREHENSIVE METABOLIC PANEL
ALT: 65 U/L — ABNORMAL HIGH (ref 0–44)
AST: 31 U/L (ref 15–41)
Albumin: 2.8 g/dL — ABNORMAL LOW (ref 3.5–5.0)
Alkaline Phosphatase: 112 U/L (ref 38–126)
Anion gap: 9 (ref 5–15)
BUN: 11 mg/dL (ref 8–23)
CO2: 29 mmol/L (ref 22–32)
Calcium: 8.7 mg/dL — ABNORMAL LOW (ref 8.9–10.3)
Chloride: 96 mmol/L — ABNORMAL LOW (ref 98–111)
Creatinine, Ser: 0.74 mg/dL (ref 0.44–1.00)
GFR, Estimated: >60 mL/min (ref >60)
Glucose, Bld: 118 mg/dL — ABNORMAL HIGH (ref 70–99)
Potassium: 3.1 mmol/L — ABNORMAL LOW (ref 3.5–5.1)
Sodium: 134 mmol/L — ABNORMAL LOW (ref 135–145)
Total Bilirubin: 1.1 mg/dL (ref 0.3–1.2)
Total Protein: 6.5 g/dL (ref 6.5–8.1)

## 2023-06-20 LAB — CBC WITH DIFFERENTIAL/PLATELET
Abs Immature Granulocytes: 0.05 10*3/uL (ref 0.00–0.07)
Basophils Absolute: 0 10*3/uL (ref 0.0–0.1)
Basophils Relative: 0 %
Eosinophils Absolute: 0 10*3/uL (ref 0.0–0.5)
Eosinophils Relative: 0 %
HCT: 37 % (ref 36.0–46.0)
Hemoglobin: 11.8 g/dL — ABNORMAL LOW (ref 12.0–15.0)
Immature Granulocytes: 0 %
Lymphocytes Relative: 14 %
Lymphs Abs: 1.7 10*3/uL (ref 0.7–4.0)
MCH: 26.5 pg (ref 26.0–34.0)
MCHC: 31.9 g/dL (ref 30.0–36.0)
MCV: 83.1 fL (ref 80.0–100.0)
Monocytes Absolute: 1.3 10*3/uL — ABNORMAL HIGH (ref 0.1–1.0)
Monocytes Relative: 11 %
Neutro Abs: 9 10*3/uL — ABNORMAL HIGH (ref 1.7–7.7)
Neutrophils Relative %: 75 %
Platelets: 492 10*3/uL — ABNORMAL HIGH (ref 150–400)
RBC: 4.45 MIL/uL (ref 3.87–5.11)
RDW: 14.7 % (ref 11.5–15.5)
WBC: 12.1 10*3/uL — ABNORMAL HIGH (ref 4.0–10.5)
nRBC: 0 % (ref 0.0–0.2)

## 2023-06-20 MED ORDER — HYDROMORPHONE HCL 1 MG/ML IJ SOLN
1.0000 mg | Freq: Once | INTRAMUSCULAR | Status: AC
  Administered 2023-06-20: 1 mg via INTRAVENOUS
  Filled 2023-06-20: qty 1

## 2023-06-20 MED ORDER — HEPARIN SODIUM (PORCINE) 5000 UNIT/ML IJ SOLN
5000.0000 [IU] | Freq: Three times a day (TID) | INTRAMUSCULAR | Status: DC
  Administered 2023-06-21 – 2023-06-22 (×5): 5000 [IU] via SUBCUTANEOUS
  Filled 2023-06-20 (×4): qty 1

## 2023-06-20 MED ORDER — LACTATED RINGERS IV BOLUS
1000.0000 mL | Freq: Once | INTRAVENOUS | Status: AC
  Administered 2023-06-20: 1000 mL via INTRAVENOUS

## 2023-06-20 MED ORDER — MORPHINE SULFATE (PF) 4 MG/ML IV SOLN
4.0000 mg | Freq: Once | INTRAVENOUS | Status: AC
  Administered 2023-06-20: 4 mg via INTRAVENOUS
  Filled 2023-06-20: qty 1

## 2023-06-20 MED ORDER — ONDANSETRON HCL 4 MG/2ML IJ SOLN
4.0000 mg | Freq: Once | INTRAMUSCULAR | Status: AC
  Administered 2023-06-20: 4 mg via INTRAVENOUS
  Filled 2023-06-20: qty 2

## 2023-06-20 MED ORDER — ACETAMINOPHEN 650 MG RE SUPP: 650.0000 mg | Freq: Four times a day (QID) | RECTAL | Status: DC | PRN

## 2023-06-20 MED ORDER — POTASSIUM CHLORIDE 10 MEQ/100ML IV SOLN
10.0000 meq | INTRAVENOUS | Status: AC
  Administered 2023-06-20 (×3): 10 meq via INTRAVENOUS
  Filled 2023-06-20 (×3): qty 100

## 2023-06-20 MED ORDER — SODIUM CHLORIDE 0.9 % IV SOLN
25.0000 mg | Freq: Four times a day (QID) | INTRAVENOUS | Status: DC | PRN
  Administered 2023-06-21 – 2023-06-23 (×6): 25 mg via INTRAVENOUS
  Filled 2023-06-20 (×5): qty 1

## 2023-06-20 MED ORDER — ONDANSETRON HCL 4 MG/2ML IJ SOLN
4.0000 mg | Freq: Four times a day (QID) | INTRAMUSCULAR | Status: DC | PRN
  Administered 2023-06-20 – 2023-06-25 (×10): 4 mg via INTRAVENOUS
  Filled 2023-06-20 (×10): qty 2

## 2023-06-20 MED ORDER — OXYCODONE HCL 5 MG PO TABS
5.0000 mg | ORAL_TABLET | ORAL | Status: DC | PRN
  Administered 2023-06-21 – 2023-06-26 (×9): 5 mg via ORAL
  Filled 2023-06-20 (×10): qty 1

## 2023-06-20 MED ORDER — ACETAMINOPHEN 325 MG PO TABS
650.0000 mg | ORAL_TABLET | Freq: Four times a day (QID) | ORAL | Status: DC | PRN
  Administered 2023-06-23 – 2023-06-26 (×3): 650 mg via ORAL
  Filled 2023-06-20 (×3): qty 2

## 2023-06-20 MED ORDER — ONDANSETRON HCL 4 MG PO TABS
4.0000 mg | ORAL_TABLET | Freq: Four times a day (QID) | ORAL | Status: DC | PRN
  Administered 2023-06-24: 4 mg via ORAL
  Filled 2023-06-20: qty 1

## 2023-06-20 MED ORDER — MORPHINE SULFATE (PF) 2 MG/ML IV SOLN
2.0000 mg | INTRAVENOUS | Status: DC | PRN
  Administered 2023-06-21 – 2023-06-25 (×14): 2 mg via INTRAVENOUS
  Filled 2023-06-20 (×15): qty 1

## 2023-06-20 MED ORDER — IOHEXOL 300 MG/ML  SOLN
100.0000 mL | Freq: Once | INTRAMUSCULAR | Status: AC | PRN
  Administered 2023-06-20: 100 mL via INTRAVENOUS

## 2023-06-20 NOTE — ED Notes (Signed)
ED Provider at bedside. 

## 2023-06-20 NOTE — ED Notes (Signed)
Ice chips provided for pt per request (EDP approval)

## 2023-06-20 NOTE — ED Triage Notes (Signed)
Pt c/o of n/v x7 weeks and abdominal pain. Pt states she has lung ca and starts radiation tomorrow. Pt unable to keep anything down.

## 2023-06-20 NOTE — ED Provider Notes (Signed)
[place] EMERGENCY DEPARTMENT AT [facility] Provider Note   CSN: [id number] Arrival date & time: 06/20/23  1011     History  Chief Complaint  Patient presents with   Abdominal Pain    Miranda Perkins is a 62 y.o. female.  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  She presents today for intractable abdominal pain, and nausea and vomiting.  Has not been able to tolerate any p.o. intake over the past week.  Last bowel movement was yesterday.  Denies any fever, hematemesis, or blood in stool.  The history is provided by the patient. No language interpreter was used.       Home Medications Prior to Admission medications   Medication Sig Start Date End Date Taking? Authorizing Provider  albuterol (VENTOLIN HFA) 108 (90 Base) MCG/ACT inhaler Inhale 2 puffs into the lungs every 6 (six) hours as needed. 04/15/23   Daphine Deutscher Mary-Margaret, FNP  fentaNYL (DURAGESIC) 25 MCG/HR Place 1 patch onto the skin every 3 (three) days. 06/16/23   [name], MD  furosemide (LASIX) 40 MG tablet Take 1.5 tablets (60 mg total) by mouth daily. 08/24/22   Antoine Poche, MD  HYDROmorphone (DILAUDID) 2 MG tablet Take 1 tablet (2 mg total) by mouth every 3 (three) hours as needed for severe pain. 05/26/23   [name], MD  ondansetron (ZOFRAN) 8 MG tablet Take 1 tablet (8 mg total) by mouth every 8 (eight) hours as needed for nausea or vomiting. 05/26/23   [name], MD  progesterone (PROMETRIUM) 200 MG capsule Nightly 10 days every 3 months 05/19/23   Lazaro Arms, MD  promethazine (PHENERGAN) 25 MG suppository Place 1 suppository (25 mg total) rectally every 6 (six) hours as needed for nausea or vomiting. 06/10/23   [name], MD  promethazine (PHENERGAN) 25 MG tablet Take 1 tablet (25 mg total) by mouth every 6 (six) hours  as needed for nausea or vomiting. 06/09/23   [name], MD  scopolamine (TRANSDERM-SCOP) 1 MG/3DAYS Place 1 patch (1.5 mg total) onto the skin every 3 (three) days. 06/16/23   [name], MD      Allergies    Influenza vaccines, Penicillins, Articaine, Cortisone, and Other    Review of Systems   Review of Systems  Constitutional:  Negative for fever.  Respiratory:  Negative for shortness of breath.   Gastrointestinal:  Positive for abdominal pain, constipation, nausea and vomiting. Negative for blood in stool.  Neurological:  Negative for light-headedness.  All other systems reviewed and are negative.   Physical Exam Updated Vital Signs BP 135/80   Pulse (!) 106   Temp 97.7 F (36.5 C) (Oral)   Resp 16   Ht 5\' 6"  (1.676 m)   Wt 103 kg   SpO2 95%   BMI 36.65 kg/m  Physical Exam Vitals and nursing note reviewed.  Constitutional:      General: She is not in acute distress.    Appearance: Normal appearance. She is not ill-appearing.  HENT:     Head: Normocephalic and atraumatic.     Nose: Nose normal.  Eyes:     General: No scleral icterus.    Extraocular Movements: Extraocular movements intact.     Conjunctiva/sclera: Conjunctivae normal.  Cardiovascular:     Rate and Rhythm: Normal rate and regular rhythm.     Heart sounds: Normal heart sounds.  Pulmonary:     Effort: Pulmonary effort is normal. No respiratory distress.     Breath sounds: Normal breath sounds. No wheezing or rales.  Abdominal:     Tenderness: There is abdominal tenderness. There is no guarding.  Musculoskeletal:        General: Normal range of motion.     Cervical back: Normal range of motion.  Skin:    General: Skin is warm and dry.  Neurological:     General: No focal deficit present.     Mental Status: She is alert. Mental status is at baseline.     ED Results / Procedures / Treatments   Labs (all labs ordered are listed, but only abnormal results are displayed) Labs  Reviewed  CBC WITH DIFFERENTIAL/PLATELET - Abnormal; Notable for the following components:      Result Value   WBC 12.1 (*)    Hemoglobin 11.8 (*)    Platelets 492 (*)    Neutro Abs 9.0 (*)    Monocytes Absolute 1.3 (*)    All other components within normal limits  COMPREHENSIVE METABOLIC PANEL - Abnormal; Notable for the following components:   Sodium 134 (*)    Potassium 3.1 (*)    Chloride 96 (*)    Glucose, Bld 118 (*)    Calcium 8.7 (*)    Albumin 2.8 (*)    ALT 65 (*)    All other components within normal limits  C DIFFICILE QUICK SCREEN W PCR REFLEX    LIPASE, BLOOD    EKG None  Radiology CT ABDOMEN PELVIS W CONTRAST  Result Date: 06/20/2023 CLINICAL DATA:  Abdominal pain for several weeks. History of lung cancer. * Tracking Code: BO * EXAM: CT ABDOMEN AND PELVIS WITH CONTRAST TECHNIQUE: Multidetector CT imaging of the abdomen and pelvis was performed using the standard protocol following bolus administration of intravenous contrast. RADIATION DOSE REDUCTION: This exam was performed according to the departmental dose-optimization program which includes automated exposure control, adjustment of the mA and/or kV according to patient size and/or use of iterative reconstruction technique. CONTRAST:  OMNIPAQUE IOHEXOL 300 MG/ML  SOLN COMPARISON:  06/11/2023 FINDINGS: Lower chest: Rapidly enlarging mass within the left hilar region is identified. This now measures at least 5.9 x 5.0 cm, image 9/4. On the PET-CT from 05/27/23 this measured 5.7 x 3.5 cm. New small right pleural effusion. Hepatobiliary: There is no suspicious liver abnormality. The gallbladder appears normal. No bile duct dilatation. Pancreas: Unremarkable. Spleen: No focal splenic abnormality. Spleen measures 12.6 cm cranial caudal Adrenals/Urinary Tract: Large solid and cystic mass centered around the right adrenal gland measures 10.3 by 7.9 by 13.7, image 22/2. On 06/11/2023 10.0 by 7.4 by 13.1 cm. Normal left  adrenal gland. The kidneys appear unremarkable. No signs of nephrolithiasis, hydronephrosis or mass. Urinary bladder appears normal. Stomach/Bowel: Stomach appears within normal limits. Progressive distension of the small bowel measure up to 2.9 cm with a few air-fluid levels. The distal small bowel loops have a normal caliber up to the ileocecal valve. No bowel wall thickening or inflammation. Normal caliber of the colon. Vascular/Lymphatic: Aortic atherosclerosis. Aortocaval lymph node measures 1 cm, image 33/2. No pelvic or inguinal adenopathy. Reproductive: Small fibroid arises off the subserosal right uterine corpus measuring 2.1 cm, image 79/2. The endometrial stripe measures 9 mm in thickness which is considered abnormal in a postmenopausal female. No adnexal mass. Other: No significant free fluid or fluid collections. Musculoskeletal: No acute or significant osseous findings. IMPRESSION: 1. Progressive  distension of the small bowel measure up to 2.9 cm with a few air-fluid levels. The distal small bowel loops have a normal caliber up to the ileocecal valve. Although nonspecific these findings may reflect developing small bowel ileus or early small-bowel obstruction. Consider serial follow-up radiographs of the abdomen. 2. Enlarging mass within the left hilar region is identified. The visualized portions in the left lower lobe measures 5.9 x 5.0 cm. On the PET-CT from 05/27/23 the perihilar portion measured up to 5.7 x 3.5 cm. 3. 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 On 06/11/2023 10.0 by 7.4 by 13.1 cm. 4. New small right pleural effusion. 5. Endometrial stripe measures 9 mm in thickness which is considered abnormal in a postmenopausal female. Consider further evaluation with pelvic ultrasound. 6.  Aortic Atherosclerosis (ICD10-I70.0). Electronically Signed   By: Signa Kell M.D.   On: 06/20/2023 17:50    Procedures Procedures    Medications Ordered  in ED Medications  potassium chloride 10 mEq in 100 mL IVPB ( Intravenous Infusion Verify 06/20/23 1806)  morphine (PF) 4 MG/ML injection 4 mg (4 mg Intravenous Given 06/20/23 1155)  lactated ringers bolus 1,000 mL (0 mLs Intravenous Stopped 06/20/23 1604)  ondansetron (ZOFRAN) injection 4 mg (4 mg Intravenous Given 06/20/23 1154)  HYDROmorphone (DILAUDID) injection 1 mg (1 mg Intravenous Given 06/20/23 1602)  ondansetron (ZOFRAN) injection 4 mg (4 mg Intravenous Given 06/20/23 1601)  iohexol (OMNIPAQUE) 300 MG/ML solution 100 mL (100 mLs Intravenous Contrast Given 06/20/23 1639)    ED Course/ Medical Decision Making/ A&P                                 Medical Decision Making Amount and/or Complexity of Data Reviewed Labs: ordered. Radiology: ordered.  Risk Prescription drug management.   62 year old female presents today for intractable nausea and vomiting along with abdominal pain.  She states she had similar abdominal pain with her adrenal mass which was controlled with medications however it currently she is unable to keep any of her medications down.  She was due to undergo radiation tomorrow for the adrenal mass but could not wait due to intensity of symptoms.  CBC shows mild leukocytosis with no significant left shift.  Mild anemia which is around her baseline.  CMP with hypokalemia at 3.1.  IV repletion ordered.  Preserved renal function.  Lipase within normal limit.  CT abdomen pelvis with contrast demonstrates ileus versus early bowel obstruction.  Discussed with on-call surgeon Dr.Pappayliou who does not believe patient has a complete bowel obstruction at this time and  feels that it favors an ileus.  Patient's last bowel movement was yesterday.  Her recommendation is that patient be made n.p.o. overnight without an NG tube at this time.  If her nausea vomiting becomes worse then recommends NG tube placement.  Otherwise she recommends diet advancement in the morning.  Will discuss with  hospitalist.  Patient and left shift is awaiting consult with hospitalist.  Signed out to oncoming provider.  Final Clinical Impression(s) / ED Diagnoses Final diagnoses:  Intractable nausea and vomiting  Ileus [facility])    Rx / DC Orders ED Discharge Orders     None         Marita Kansas, PA-C 06/20/23 1857    Sloan Leiter, DO 06/21/23 1258

## 2023-06-20 NOTE — ED Provider Notes (Signed)
Patient is a handoff from [place], [place]. Plan is to have patient admitted for ileus with developing small bowel obstruction. Karie Mainland, PA-C spoke with Dr. Robyne Peers, general surgery, who does not feel that this is SBO, but wants admission for surgery evaluation. Not recommending NG tube at this time unless patient begins to acute experience more persistent vomiting. Physical Exam  BP 135/80   Pulse (!) 106   Temp 97.7 F (36.5 C) (Oral)   Resp 16   Ht 5\' 6"  (1.676 m)   Wt 103 kg   SpO2 95%   BMI 36.65 kg/m   Physical Exam  Procedures  Procedures  ED Course / MDM    Medical Decision Making Amount and/or Complexity of Data Reviewed Labs: ordered. Radiology: ordered.  Risk Prescription drug management. Decision regarding hospitalization.   Patient is a handoff from [place], [place]. Plan is to have patient admitted for ileus with developing small bowel obstruction. Karie Mainland, PA-C spoke with Dr. Robyne Peers, general surgery, who does not feel that this is SBO, but wants admission for surgery evaluation. Not recommending NG tube at this time unless patient begins to acute experience more persistent vomiting.  Hospitalist consult for admission pending at this time.

## 2023-06-21 ENCOUNTER — Inpatient Hospital Stay

## 2023-06-21 ENCOUNTER — Observation Stay (HOSPITAL_COMMUNITY)

## 2023-06-21 ENCOUNTER — Encounter (HOSPITAL_COMMUNITY): Payer: Self-pay | Admitting: Emergency Medicine

## 2023-06-21 DIAGNOSIS — C969 Malignant neoplasm of lymphoid, hematopoietic and related tissue, unspecified: Secondary | ICD-10-CM | POA: Diagnosis present

## 2023-06-21 DIAGNOSIS — R197 Diarrhea, unspecified: Secondary | ICD-10-CM | POA: Diagnosis present

## 2023-06-21 DIAGNOSIS — C3490 Malignant neoplasm of unspecified part of unspecified bronchus or lung: Secondary | ICD-10-CM | POA: Diagnosis not present

## 2023-06-21 DIAGNOSIS — K567 Ileus, unspecified: Secondary | ICD-10-CM | POA: Diagnosis present

## 2023-06-21 DIAGNOSIS — D649 Anemia, unspecified: Secondary | ICD-10-CM | POA: Diagnosis present

## 2023-06-21 DIAGNOSIS — K56609 Unspecified intestinal obstruction, unspecified as to partial versus complete obstruction: Secondary | ICD-10-CM | POA: Diagnosis present

## 2023-06-21 DIAGNOSIS — Z8673 Personal history of transient ischemic attack (TIA), and cerebral infarction without residual deficits: Secondary | ICD-10-CM | POA: Diagnosis not present

## 2023-06-21 DIAGNOSIS — U099 Post covid-19 condition, unspecified: Secondary | ICD-10-CM | POA: Diagnosis present

## 2023-06-21 DIAGNOSIS — M8580 Other specified disorders of bone density and structure, unspecified site: Secondary | ICD-10-CM | POA: Diagnosis present

## 2023-06-21 DIAGNOSIS — E871 Hypo-osmolality and hyponatremia: Secondary | ICD-10-CM | POA: Diagnosis present

## 2023-06-21 DIAGNOSIS — Z887 Allergy status to serum and vaccine status: Secondary | ICD-10-CM | POA: Diagnosis not present

## 2023-06-21 DIAGNOSIS — Z85828 Personal history of other malignant neoplasm of skin: Secondary | ICD-10-CM | POA: Diagnosis not present

## 2023-06-21 DIAGNOSIS — R590 Localized enlarged lymph nodes: Secondary | ICD-10-CM | POA: Diagnosis present

## 2023-06-21 DIAGNOSIS — Z96652 Presence of left artificial knee joint: Secondary | ICD-10-CM | POA: Diagnosis present

## 2023-06-21 DIAGNOSIS — Z880 Allergy status to penicillin: Secondary | ICD-10-CM | POA: Diagnosis not present

## 2023-06-21 DIAGNOSIS — M7989 Other specified soft tissue disorders: Secondary | ICD-10-CM

## 2023-06-21 DIAGNOSIS — J189 Pneumonia, unspecified organism: Secondary | ICD-10-CM | POA: Diagnosis not present

## 2023-06-21 DIAGNOSIS — E876 Hypokalemia: Secondary | ICD-10-CM

## 2023-06-21 DIAGNOSIS — Z87891 Personal history of nicotine dependence: Secondary | ICD-10-CM | POA: Diagnosis not present

## 2023-06-21 DIAGNOSIS — D72829 Elevated white blood cell count, unspecified: Secondary | ICD-10-CM | POA: Insufficient documentation

## 2023-06-21 DIAGNOSIS — C7971 Secondary malignant neoplasm of right adrenal gland: Secondary | ICD-10-CM | POA: Diagnosis present

## 2023-06-21 DIAGNOSIS — E6609 Other obesity due to excess calories: Secondary | ICD-10-CM | POA: Diagnosis present

## 2023-06-21 DIAGNOSIS — E041 Nontoxic single thyroid nodule: Secondary | ICD-10-CM | POA: Diagnosis present

## 2023-06-21 DIAGNOSIS — A0471 Enterocolitis due to Clostridium difficile, recurrent: Secondary | ICD-10-CM | POA: Diagnosis present

## 2023-06-21 DIAGNOSIS — R112 Nausea with vomiting, unspecified: Secondary | ICD-10-CM

## 2023-06-21 DIAGNOSIS — E878 Other disorders of electrolyte and fluid balance, not elsewhere classified: Secondary | ICD-10-CM | POA: Diagnosis present

## 2023-06-21 DIAGNOSIS — A0472 Enterocolitis due to Clostridium difficile, not specified as recurrent: Secondary | ICD-10-CM | POA: Diagnosis not present

## 2023-06-21 DIAGNOSIS — Z6836 Body mass index (BMI) 36.0-36.9, adult: Secondary | ICD-10-CM | POA: Diagnosis not present

## 2023-06-21 DIAGNOSIS — C3492 Malignant neoplasm of unspecified part of left bronchus or lung: Secondary | ICD-10-CM | POA: Diagnosis present

## 2023-06-21 LAB — CBC WITH DIFFERENTIAL/PLATELET
Abs Immature Granulocytes: 0.06 10*3/uL (ref 0.00–0.07)
Basophils Absolute: 0 10*3/uL (ref 0.0–0.1)
Basophils Relative: 0 %
Eosinophils Absolute: 0 10*3/uL (ref 0.0–0.5)
Eosinophils Relative: 0 %
HCT: 34.2 % — ABNORMAL LOW (ref 36.0–46.0)
Hemoglobin: 10.5 g/dL — ABNORMAL LOW (ref 12.0–15.0)
Immature Granulocytes: 1 %
Lymphocytes Relative: 15 %
Lymphs Abs: 1.7 10*3/uL (ref 0.7–4.0)
MCH: 26 pg (ref 26.0–34.0)
MCHC: 30.7 g/dL (ref 30.0–36.0)
MCV: 84.7 fL (ref 80.0–100.0)
Monocytes Absolute: 1.2 10*3/uL — ABNORMAL HIGH (ref 0.1–1.0)
Monocytes Relative: 11 %
Neutro Abs: 8 10*3/uL — ABNORMAL HIGH (ref 1.7–7.7)
Neutrophils Relative %: 73 %
Platelets: 480 10*3/uL — ABNORMAL HIGH (ref 150–400)
RBC: 4.04 MIL/uL (ref 3.87–5.11)
RDW: 14.7 % (ref 11.5–15.5)
WBC: 10.9 10*3/uL — ABNORMAL HIGH (ref 4.0–10.5)
nRBC: 0 % (ref 0.0–0.2)

## 2023-06-21 LAB — COMPREHENSIVE METABOLIC PANEL
ALT: 61 U/L — ABNORMAL HIGH (ref 0–44)
AST: 33 U/L (ref 15–41)
Albumin: 2.5 g/dL — ABNORMAL LOW (ref 3.5–5.0)
Alkaline Phosphatase: 108 U/L (ref 38–126)
Anion gap: 10 (ref 5–15)
BUN: 11 mg/dL (ref 8–23)
CO2: 26 mmol/L (ref 22–32)
Calcium: 8.3 mg/dL — ABNORMAL LOW (ref 8.9–10.3)
Chloride: 97 mmol/L — ABNORMAL LOW (ref 98–111)
Creatinine, Ser: 0.77 mg/dL (ref 0.44–1.00)
GFR, Estimated: >60 mL/min (ref >60)
Glucose, Bld: 99 mg/dL (ref 70–99)
Potassium: 3.2 mmol/L — ABNORMAL LOW (ref 3.5–5.1)
Sodium: 133 mmol/L — ABNORMAL LOW (ref 135–145)
Total Bilirubin: 1.2 mg/dL (ref 0.3–1.2)
Total Protein: 6 g/dL — ABNORMAL LOW (ref 6.5–8.1)

## 2023-06-21 LAB — PROCALCITONIN: Procalcitonin: 1.71 ng/mL

## 2023-06-21 LAB — MAGNESIUM: Magnesium: 1.9 mg/dL (ref 1.7–2.4)

## 2023-06-21 MED ORDER — POTASSIUM CHLORIDE CRYS ER 20 MEQ PO TBCR
40.0000 meq | EXTENDED_RELEASE_TABLET | Freq: Once | ORAL | Status: AC
  Administered 2023-06-21: 40 meq via ORAL
  Filled 2023-06-21: qty 2

## 2023-06-21 MED ORDER — FIDAXOMICIN 200 MG PO TABS: 200.0000 mg | ORAL_TABLET | Freq: Two times a day (BID) | ORAL | Status: DC

## 2023-06-21 MED ORDER — SODIUM CHLORIDE 0.9 % IV SOLN: INTRAVENOUS | Status: DC

## 2023-06-21 MED ORDER — PROMETHAZINE HCL 25 MG/ML IJ SOLN
INTRAMUSCULAR | Status: AC
  Filled 2023-06-21: qty 1

## 2023-06-21 MED ORDER — VANCOMYCIN HCL 125 MG PO CAPS
125.0000 mg | ORAL_CAPSULE | Freq: Four times a day (QID) | ORAL | Status: DC
  Administered 2023-06-21 – 2023-06-26 (×20): 125 mg via ORAL
  Filled 2023-06-21 (×21): qty 1

## 2023-06-21 NOTE — Assessment & Plan Note (Addendum)
CT A/P with possible ileus Surgery consulting, non-surgical at this time

## 2023-06-21 NOTE — Assessment & Plan Note (Deleted)
-   Holding Lasix  -TED hose - Continue to monitor

## 2023-06-21 NOTE — Progress Notes (Signed)
Patient has c/o n/v throughout this shift. Patient received PRN Zofran x1 and Phenergan x1. Patient has dry heaved throughout the night with some thick phlegm noted at times. Patient given PRN morphine once for pain. Patient started on IV fluids. Ted hose applied to BLE. Patient ambulated to the BR several times this shift with assistance. Patient has been NPO since last night. No BM this shift, still needs CDIFF stool sample.

## 2023-06-21 NOTE — Telephone Encounter (Signed)
Orders have been sent to [place] with Gertie Exon of [facility] as requested.

## 2023-06-21 NOTE — Progress Notes (Signed)
Initial Nutrition Assessment  DOCUMENTATION CODES:   Obesity unspecified  INTERVENTION:   -RD will follow for diet advancement and add supplements as appropriate  NUTRITION DIAGNOSIS:   Inadequate oral intake related to inability to eat as evidenced by NPO status.  GOAL:   Patient will meet greater than or equal to 90% of their needs  MONITOR:   Diet advancement  REASON FOR ASSESSMENT:   Malnutrition Screening Tool    ASSESSMENT:   Pt with medical history significant of adrenal hemorrhage, postmenopausal vaginal bleeding, history of lung biopsy, history of C. difficile, and more presents with a chief complaint of abdominal pain and back pain.  Pt admitted with ileus and intractable nausea and vomiting.   Reviewed I/O's: +1.2 L x 24 hours  Pt unavailable at time of visit. Attempted to speak with pt via call to hospital room phone, however, unable to reach. RD unable to obtain further nutrition-related history or complete nutrition-focused physical exam at this time.    Per H&P, pt has had intractable nausea and vomiting for 1 month PTA, worsening for the past 2 days PTA. Her last meal was 1 day PTA.   Pt currently NPO.   Reviewed wt hx; pt has experinced a 8% wt loss over the past 2 months, which is significant for time frame. Pt is at high risk for malnutrition, however, unable to identify a this time. Pt also with generalized edema per nursing assessment, which may be masking true weight loss as well as fat and muscle depletions.   Obesity is a complex, chronic medical condition that is optimally managed by a multidisciplinary care team. Weight loss is not an ideal goal for an acute inpatient hospitalization. However, if further work-up for obesity is warranted, consider outpatient referral to [facility].    Medications reviewed and include phenergan and 0.9% sodium chloride infusion @ 75 ml/hr.   Labs reviewed: Na: 133, K:  3.2.    Diet Order:   Diet Order             Diet NPO time specified Except for: Sips with Meds  Diet effective now                   EDUCATION NEEDS:   No education needs have been identified at this time  Skin:  Skin Assessment: Reviewed RN Assessment  Last BM:  06/18/23  Height:   Ht Readings from Last 1 Encounters:  06/20/23 5\' 6"  (1.676 m)    Weight:   Wt Readings from Last 1 Encounters:  06/20/23 103 kg    Ideal Body Weight:  59.1 kg  BMI:  Body mass index is 36.65 kg/m.  Estimated Nutritional Needs:   Kcal:  1750-1950  Protein:  90-105 grams  Fluid:  > 1.7 L    Levada Schilling, RD, LDN, CDCES Registered Dietitian II Certified Diabetes Care and Education Specialist Please refer to [facility] for RD and/or RD on-call/weekend/after hours pager

## 2023-06-21 NOTE — Assessment & Plan Note (Addendum)
Recently diagnosed in July with bronchoscopy with biopsy performed on Monday, 8/12 by Dr. 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 Intensely hypermetabolic solid 5.7 cm posterior left perihilar lung mass, compatible with primary bronchogenic malignancy. 2. 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 3. Mildly hypermetabolic aortocaval retroperitoneal adenopathy, suspicious for nodal metastasis. 4. Intensely hypermetabolic heterogeneous hypodense 1.7 cm right thyroid nodule, suspicious for thyroid malignancy. 5)  Hypermetabolic superficial subcutaneous 0.7 cm soft tissue lesion in the medial proximal right upper extremity, nonspecific, cannot exclude soft tissue metastasis. Thyroid US 7/9 with no evidence of malignancy and stable node size  Oncology to see s/p biopsy - discussed with Dr. Ellin Saba and he will see her in the hospital Continue oral Dilaudid as needed for pain.  Fentanyl patch was prescribed per recommendation from palliative care team but patient does not want to use this medication. She reports cough productive of persistent thick sputum, likely related to underlying malignancy.  She reports significant improvement with addition of Mucinex.

## 2023-06-21 NOTE — Assessment & Plan Note (Addendum)
Potassium 3.1 in the ED, repleted and still low this AM at 3.2 Will give an additional 40 mEq PO KCl at time Repeat BMP in AM

## 2023-06-21 NOTE — H&P (Signed)
History and Physical    Patient: Miranda Perkins OAC:[id number] DOB: 03/16/1961 DOA: 06/20/2023 DOS: the patient was seen and examined on 06/21/2023 PCP: [name], DO  Patient coming from: Home  Chief Complaint:  Chief Complaint  Patient presents with   Abdominal Pain   HPI: Miranda Perkins is a 62 y.o. female with medical history significant of adrenal hemorrhage, postmenopausal vaginal bleeding, history of lung biopsy, history of C. difficile, and more presents the ED with a chief complaint of abdominal pain and back pain.  Patient reports that she has had abdominal pain and intractable nausea and vomiting for a month.  It has been worse over the last 2 days.  She has not had any normal bowel movements since her C. difficile a month ago.  She reports once in a while it is every day and other times it is every other day that she has a bowel movement but they are not normal.  She reports that she has had nausea, vomiting, dry heaves constantly.  Her last attempted a meal was day before yesterday.  She reports that all came up.  She has not had her medications in about a week because she cannot keep anything down.  Patient reports that she does have mild hemoptysis ever since she had a lung biopsy.  She has had some postmenopausal bleeding for which she is following outpatient.  She is going to be starting Prometrium with her outpatient doctor.  She was prescribed scopolamine but she is not using it as she reports it dries her out too much.  She denies any dysuria, hematuria.  Her abdominal pain is in her epigastric region and she describes it as aching.  No other complaints at this time.  Patient does not smoke and does not drink.  Patient is full code. Review of Systems: As mentioned in the history of present illness. All other systems reviewed and are negative. Past Medical History:  Diagnosis Date   Adrenal hemorrhage (HCC)    Anemia    Arthritis    Cancer (HCC)    skin  cancer - basal   Dyspnea    Edema of both lower extremities    Endometrial polyp    Hirsutism    History of 2019 novel coronavirus disease (COVID-19) 12/21/2020   positive home result documented in pcp note in epic 12-23-2020,  residual doe, pulmonology consult w/ dr wert note in epic 03-20-2021   History of basal cell carcinoma (BCC) excision 2016   forehead, per pt no recurrence   History of CVA (cerebrovascular accident) without residual deficits 1993   pt stated , while on Chloramphenicol, no residuaL; [medication given to treat tick bite] ; patient states  "i stroked out right next to my doctor , he said i passed out and that was my only symptom" ;  no resiudal   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 branch,  work-up results in epic 03/ 2022 (normal nuclear stress test, normal echo, no arrhythmia's per event monitor);   (05-28-2021 per pt no symptoms since 03/ 2022)   Multiple thyroid nodules    endocrinologist--- dr g. nida,  hx benign bx, clinically euthyroid   Osteopenia 12/2017   T score -1.2 FRAX 4.9% / 0.3%   PMB (postmenopausal bleeding)    Pneumonia  x 2   Post-COVID chronic dyspnea    Pre-diabetes    Rosacea    Stroke (HCC)    Thickened endometrium    Past Surgical History:  Procedure Laterality Date   ANTERIOR AND POSTERIOR REPAIR N/A 08/21/2020   Procedure: ANTERIOR (CYSTOCELE) AND POSTERIOR REPAIR (RECTOCELE);  Surgeon: Genia Del, MD;  Location: [facility];  Service: Gynecology;  Laterality: N/A;  requesting 9:00am OR time  requests one hour   ANTERIOR CERVICAL DECOMP/DISCECTOMY FUSION N/A 09/27/2017   Procedure: ANTERIOR CERVICAL DECOMPRESSION/DISCECTOMY FUSION CERVICAL FOUR-FIVE ,CERVICAL FIVE-SIX,CERVICAL SIX-SEVEN;  Surgeon: Donalee Citrin, MD;  Location: [facility] OR;  Service: Neurosurgery;   Laterality: N/A;   CARPAL TUNNEL RELEASE Left 09/27/2017   Procedure: CARPAL TUNNEL RELEASE;  Surgeon: Donalee Citrin, MD;  Location: [facility] OR;  Service: Neurosurgery;  Laterality: Left;   DILATATION & CURETTAGE/HYSTEROSCOPY WITH MYOSURE N/A 06/03/2021   Procedure: DILATATION & CURETTAGE/HYSTEROSCOPY WITH MYOSURE;  Surgeon: Genia Del, MD;  Location: [facility] ;  Service: Gynecology;  Laterality: N/A;   FOOT SURGERY     bone removed from pinky toe   INGUINAL HERNIA REPAIR Right 1995   KNEE ARTHROSCOPY W/ MENISCAL REPAIR Left 06/21/2017   dr Netta Corrigan   LEG SURGERY  1972   MOHS SURGERY  2016   BCC of forehead   MOUTH SURGERY  2015   removal and cauterization pallete of cancerous lesion   TONSILLECTOMY  1979   TONSILLECTOMY  1977   TOTAL KNEE ARTHROPLASTY Left 01/26/2018   Procedure: LEFT TOTAL KNEE ARTHROPLASTY;  Surgeon: Ranee Gosselin, MD;  Location: [facility];  Service: Orthopedics;  Laterality: Left;   TUBAL LIGATION     Social History:  reports that she quit smoking about 13 years ago. Her smoking use included cigarettes. She started smoking about 48 years ago. She has a 17.5 pack-year smoking history. She has never used smokeless tobacco. She reports that she does not currently use alcohol. She reports that she does not use drugs.  Allergies  Allergen Reactions   Influenza Vaccines Anaphylaxis and Other (See Comments)    Per patient   Penicillins Anaphylaxis and Other (See Comments)   Articaine Other (See Comments)    Caused infection   Cortisone Other (See Comments)    Turned red and ran a low grade fever for 3 days   Other Rash    bandaids- skin turns red and a rash     Family History  Problem Relation Age of Onset   Diabetes Mother    Heart Problems Mother    Rheum arthritis Mother    COPD Mother    Polycystic kidney disease Mother    Carpal tunnel syndrome Mother    Cancer Father        Liver Cancer   Liver cancer Father    Carpal tunnel syndrome  Sister    Thyroid disease Sister    Rheum arthritis Sister    Lung disease Sister    Diabetes Sister    Carpal tunnel syndrome Sister    Clotting disorder Sister    Rheum arthritis Sister    Thyroid disease Sister    COPD Sister    Heart Problems Sister    Gout Sister    Cancer Brother    Heart Problems Maternal Grandmother    Cancer Maternal Grandfather        Lung Cancer   Heart Problems Maternal Grandfather    Heart Problems Paternal Grandmother    Cancer Paternal Grandfather  Brain & Skin cancer   Polycystic ovary syndrome Daughter    Rheum arthritis Maternal Aunt    Fibromyalgia Maternal Aunt    Polycystic kidney disease Maternal Aunt    Heart Problems Maternal Aunt    Anemia Maternal Aunt    Adrenal disorder Maternal Aunt    Carpal tunnel syndrome Maternal Aunt    Diabetes Maternal Aunt    Thyroid disease Maternal Aunt    Rheum arthritis Maternal Aunt    Diabetes Maternal Aunt    Diabetes Maternal Aunt    Rheum arthritis Maternal Aunt    Heart Problems Maternal Aunt    Rheum arthritis Maternal Uncle    Heart Problems Maternal Uncle     Prior to Admission medications   Medication Sig Start Date End Date Taking? Authorizing Provider  albuterol (VENTOLIN HFA) 108 (90 Base) MCG/ACT inhaler Inhale 2 puffs into the lungs every 6 (six) hours as needed. 04/15/23   Daphine Deutscher Mary-Margaret, FNP  fentaNYL (DURAGESIC) 25 MCG/HR Place 1 patch onto the skin every 3 (three) days. 06/16/23   [name], MD  furosemide (LASIX) 40 MG tablet Take 1.5 tablets (60 mg total) by mouth daily. 08/24/22   Antoine Poche, MD  HYDROmorphone (DILAUDID) 2 MG tablet Take 1 tablet (2 mg total) by mouth every 3 (three) hours as needed for severe pain. 05/26/23   [name], MD  ondansetron (ZOFRAN) 8 MG tablet Take 1 tablet (8 mg total) by mouth every 8 (eight) hours as needed for nausea or vomiting. 05/26/23   [name], MD  progesterone (PROMETRIUM) 200 MG  capsule Nightly 10 days every 3 months 05/19/23   Lazaro Arms, MD  promethazine (PHENERGAN) 25 MG suppository Place 1 suppository (25 mg total) rectally every 6 (six) hours as needed for nausea or vomiting. 06/10/23   [name], MD  promethazine (PHENERGAN) 25 MG tablet Take 1 tablet (25 mg total) by mouth every 6 (six) hours as needed for nausea or vomiting. 06/09/23   [name], MD  scopolamine (TRANSDERM-SCOP) 1 MG/3DAYS Place 1 patch (1.5 mg total) onto the skin every 3 (three) days. 06/16/23   [name], MD    Physical Exam: Vitals:   06/20/23 2214 06/20/23 2254 06/21/23 0215 06/21/23 0457  BP:  (!) 141/84 136/81 126/78  Pulse:   (!) 127 (!) 113  Resp:  16  20  Temp: 98 F (36.7 C) 98.5 F (36.9 C)  99.1 F (37.3 C)  TempSrc: Oral Oral  Oral  SpO2:  96%  97%  Weight:      Height:       1.  General: Patient lying supine in bed,  no acute distress   2. Psychiatric: Alert and oriented x 3, mood and behavior normal for situation, pleasant and cooperative with exam   3. Neurologic: Speech and language are normal, face is symmetric, moves all 4 extremities voluntarily, at baseline without acute deficits on limited exam   4. HEENMT:  Head is atraumatic, normocephalic, pupils reactive to light, neck is supple, trachea is midline, mucous membranes are moist   5. Respiratory : Lungs are clear to auscultation bilaterally without wheezing, rhonchi, rales, no cyanosis, no increase in work of breathing or accessory muscle use   6. Cardiovascular : Heart rate normal, rhythm is regular, no murmurs, rubs or gallops, positive for peripheral edema, peripheral pulses palpated   7. Gastrointestinal:  Abdomen is soft, nondistended, nontender to palpation bowel sounds active, no masses or organomegaly palpated  8. Skin:  Skin is warm, dry and intact without rashes, acute lesions, or ulcers on limited exam   9.Musculoskeletal:  No acute deformities or trauma,  no asymmetry in tone, positive for peripheral edema, peripheral pulses palpated, no tenderness to palpation in the extremities  Data Reviewed: In the ED Afebrile, tachycardic, tachypneic, blood pressure is 109/64-146/96, satting 92-99% Leukocytosis 12.1, hemoglobin 11.8 Hypokalemia 3.1 CT abdomen pelvis shows SBO or ileus and an endometrial stripe C. difficile pending Dilaudid, morphine given for abdominal pain in the ED 1 L bolus in the ED 30 mEq potassium Admission requested for intractable nausea and vomiting and need for serial imaging given possible SBO on CT  Assessment and Plan: * Ileus (HCC) - CT abdomen pelvis shows possible ileus - N.p.o. - Continue IV hydration - Continue to monitor  Intractable nausea and vomiting - With associated leukocytosis 12.1 - Zofran x 2 given in the ED - Continue as needed Zofran - CT abdomen pelvis shows possible SBO or ileus - X-ray this a.m. to monitor progression - N.p.o. - Continue to monitor  Hypokalemia - Potassium 3.1 - 30 mEq of potassium in the ED - Trend in the a.m.  Leukocytosis - White blood cell count 12.1 - C. difficile panel pending - CT abdomen pelvis shows SBO or ileus - Last UA was August 9 and patient has no urinary symptoms - Likely acute phase reactant in the setting of intractable nausea and vomiting - Procalcitonin pending - Continue to monitor  Leg swelling - Holding Lasix  -TED hose - Continue to monitor      Advance Care Planning:   Code Status: Full Code  Consults: General Surgery consulted from the ED and does not think this is likely to be an SBO  Family Communication: No family at bedside  Severity of Illness: The appropriate patient status for this patient is OBSERVATION. Observation status is judged to be reasonable and necessary in order to provide the required intensity of service to ensure the patient's safety. The patient's presenting symptoms, physical exam findings, and initial  radiographic and laboratory data in the context of their medical condition is felt to place them at decreased risk for further clinical deterioration. Furthermore, it is anticipated that the patient will be medically stable for discharge from the hospital within 2 midnights of admission.   Author: Lilyan Gilford, DO 06/21/2023 5:17 AM  For on call review [email or web address].

## 2023-06-21 NOTE — Assessment & Plan Note (Deleted)
-   White blood cell count 12.1 - C. difficile panel pending - CT abdomen pelvis shows SBO or ileus - Last UA was August 9 and patient has no urinary symptoms - Likely acute phase reactant in the setting of intractable nausea and vomiting - Procalcitonin pending - Continue to monitor

## 2023-06-21 NOTE — Assessment & Plan Note (Addendum)
Patient with recent history of C difficile colitis It was treated with 10 days of Dificid but symptoms never completely resolved - still having periodic diarrhea as well as recent intractable n/v Imaging with concern for ileus Suspect that this is persistent (not recurrent) C diff infection Leukocytosis/elevated procalcitonin likely related to this issue Discussed with Dr. Ninetta Lights from ID - further testing is not needed, will repeat course of treatment Patient reports nausea from Dificid and would like an alternate treatment option, so will treat with PO Vancomycin Repeat AXR this AM unremarkable

## 2023-06-21 NOTE — Progress Notes (Signed)
   06/21/23 1313  TOC Brief Assessment  Insurance and Status Reviewed  Patient has primary care physician Yes  Home environment has been reviewed Home with Spouse  Prior level of function: Independant  Prior/Current Home Services No current home services  Social Determinants of Health Reivew SDOH reviewed no interventions necessary  Readmission risk has been reviewed Yes  Transition of care needs no transition of care needs at this time   In OBS for Ileus, Transition of Care Department (TOC) has reviewed patient and no TOC needs have been identified at this time. We will continue to monitor patient advancement through interdisciplinary progression rounds. If new patient transition needs arise, please place a TOC consult.

## 2023-06-21 NOTE — Telephone Encounter (Signed)
The EBUS samples are still pending. Prelim data consistent with malignancy. She is admitted currently. I will defer to inpatient team to continue the evaluation.

## 2023-06-21 NOTE — Progress Notes (Signed)
Progress Note   Patient: Miranda Perkins OVF:[id number] DOB: May 16, 1961 DOA: 06/20/2023     0 DOS: the patient was seen and examined on 06/21/2023   Brief hospital course: 62yo female with h/o adrenal hemorrhage, postmenopausal vaginal bleeding, and C. Difficile infection 1 month ago who presented on 8/19 with abdominal pain.  CT with possible ileus.  Persistent n/v that has been intractable.  Assessment and Plan: * recent history of C. difficile colitis Patient with recent history of C difficile colitis It was treated with 10 days of Dificid but symptoms never completely resolved - still having periodic diarrhea as well as recent intractable n/v Imaging with concern for ileus Suspect that this is persistent (not recurrent) C diff infection Leukocytosis/elevated procalcitonin likely related to this issue Discussed with Dr. Ninetta Lights from ID - further testing is not needed, will repeat course of treatment Patient reports nausea from Dificid and would like an alternate treatment option, so will treat with PO Vancomycin Repeat AXR this AM unremarkable  Hypokalemia Potassium 3.1 in the ED, repleted and still low this AM at 3.2 Will give an additional 40 mEq PO KCl at time Repeat BMP in AM  Malignant neoplasm of unspecified part of unspecified bronchus or lung (HCC) Recently diagnosed in July with bronchoscopy with biopsy performed on Monday, 8/12 by Dr. 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    PET scan done 7/25 1. Intensely hypermetabolic solid 5.7 cm posterior left perihilar lung mass, compatible with primary bronchogenic malignancy. 2. 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 3. Mildly hypermetabolic aortocaval retroperitoneal adenopathy, suspicious for nodal metastasis. 4. Intensely hypermetabolic heterogeneous hypodense 1.7 cm right thyroid nodule, suspicious for thyroid  malignancy. 5)  Hypermetabolic superficial subcutaneous 0.7 cm soft tissue lesion in the medial proximal right upper extremity, nonspecific, cannot exclude soft tissue metastasis.   Thyroid US 7/9 with no evidence of malignancy and stable node size  Oncology to see s/p biopsy  Continue oral Dilaudid as needed for pain.  Fentanyl patch was prescribed per recommendation from palliative care team but patient does not want to use this medication.  She reports cough productive of persistent thick sputum.  Will hold further evaluation for now but consider GI consult.  Ileus (HCC) CT A/P with possible ileus Surgery consulting, non-surgical at this time    Consultants: Surgery  Procedures: None  Antibiotics: Oral vancomycin, 8/19-28  30 Day Unplanned Readmission Risk Score    Flowsheet Row ED to Hosp-Admission (Discharged) from 06/11/2023 in [facility] GENERAL MED/SURG UNIT  30 Day Unplanned Readmission Risk Score (%) 18.66 Filed at 06/13/2023 1200       This score is the patient's risk of an unplanned readmission within 30 days of being discharged (0 -100%). The score is based on dignosis, age, lab data, medications, orders, and past utilization.   Low:  0-14.9   Medium: 15-21.9   High: 22-29.9   Extreme: 30 and above           Subjective: She reports h/o C diff, treated with Dificid x 10 days with this caused increased/persistent nausea and she also continues to have periodic diarrhea.  She is not convinced she is better yet.    She has had multiple recent hospitalizations with recent presumed diagnosis of lung CA but negative pathology so far, hilar mass biopsies are still pending.  She had a hemorrhagic adrenal gland mass in May.   She is supposed to  start radiation therapy for the adrenal mass today.   Objective: Vitals:   06/21/23 0215 06/21/23 0457  BP: 136/81 126/78  Pulse: (!) 127 (!) 113  Resp:  20  Temp:  99.1 F (37.3 C)  SpO2:  97%    Intake/Output  Summary (Last 24 hours) at 06/21/2023 1353 Last data filed at 06/20/2023 1911 Gross per 24 hour  Intake 1191.37 ml  Output --  Net 1191.37 ml   Filed Weights   06/20/23 1049  Weight: 103 kg    Exam:  General:  Appears calm and comfortable and is in NAD Eyes:  EOMI, normal lids, iris ENT:  grossly normal hearing, lips & tongue, mmm Neck:  no LAD, masses or thyromegaly Cardiovascular:  RRR, no m/r/g. No LE edema.  Respiratory:   CTA bilaterally with no wheezes/rales/rhonchi.  Normal respiratory effort. Abdomen:  soft, mildly diffusely tender, ND, NABS Skin:  no rash or induration seen on limited exam Musculoskeletal:  grossly normal tone BUE/BLE, good ROM, no bony abnormality Psychiatric:  grossly normal mood and affect, speech fluent and appropriate, AOx3 Neurologic:  CN 2-12 grossly intact, moves all extremities in coordinated fashion    Data Reviewed: I have reviewed the patient's lab results since admission.  Pertinent labs for today include:  K+ 3.2 Albumin 2.5 ALT 61, improving Procalcitonin 1.71 WBC 10.9 Hgb 10.5 Platelets 480     Family Communication: Husband was present throughout evaluation  Disposition: Status is: Inpatient Admit - It is my clinical opinion that admission to INPATIENT is reasonable and necessary because of the expectation that this patient will require hospital care that crosses at least 2 midnights to treat this condition based on the medical complexity of the problems presented.  Given the aforementioned information, the predictability of an adverse outcome is felt to be significant.   Planned Discharge Destination: Home    Time spent: 50 minutes  Author: Jonah Blue, MD 06/21/2023 1:53 PM  For on call review [email or web address].

## 2023-06-21 NOTE — Consult Note (Signed)
Reason for Consult: Ileus versus partial bowel obstruction Referring Physician: Dr. Rod Holler is an 62 y.o. female.  HPI: Patient is a 62 year old white female with multiple medical problems including recently diagnosed lung cancer who has been undergoing treatment for C. difficile colitis recently.  She is currently not undergoing therapy for her C. difficile colitis.  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  She has had ongoing abdominal pain and cramping with nausea and vomiting and decreased appetite.  She has also had various episodes of diarrhea.  Her last bowel movement was just before I came into the room.  Her only history of abdominal surgery is a tubal ligation.  She states her nausea and vomiting come in waves.  She denies any nausea or vomiting.  Past Medical History:  Diagnosis Date   Adrenal hemorrhage (HCC)    Anemia    Arthritis    Cancer (HCC)    skin cancer - basal   Dyspnea    Edema of both lower extremities    Endometrial polyp    Hirsutism    History of 2019 novel coronavirus disease (COVID-19) 12/21/2020   positive home result documented in pcp note in epic 12-23-2020,  residual doe, pulmonology consult w/ dr wert note in epic 03-20-2021   History of basal cell carcinoma (BCC) excision 2016   forehead, per pt no recurrence   History of CVA (cerebrovascular accident) without residual deficits 1993   pt stated , while on Chloramphenicol, no residuaL; [medication given to treat tick bite] ; patient states  "i stroked out right next to my doctor , he said i passed out and that was my only symptom" ;  no resiudal   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 branch,  work-up results in epic 03/ 2022 (normal nuclear stress test, normal echo, no arrhythmia's per event monitor);    (05-28-2021 per pt no symptoms since 03/ 2022)   Multiple thyroid nodules    endocrinologist--- dr g. nida,  hx benign bx, clinically euthyroid   Osteopenia 12/2017   T score -1.2 FRAX 4.9% / 0.3%   PMB (postmenopausal bleeding)    Pneumonia    x 2   Post-COVID chronic dyspnea    Pre-diabetes    Rosacea    Stroke (HCC)    Thickened endometrium     Past Surgical History:  Procedure Laterality Date   ANTERIOR AND POSTERIOR REPAIR N/A 08/21/2020   Procedure: ANTERIOR (CYSTOCELE) AND POSTERIOR REPAIR (RECTOCELE);  Surgeon: Genia Del, MD;  Location: [facility];  Service: Gynecology;  Laterality: N/A;  requesting 9:00am OR time  requests one hour   ANTERIOR CERVICAL DECOMP/DISCECTOMY FUSION N/A 09/27/2017   Procedure: ANTERIOR CERVICAL DECOMPRESSION/DISCECTOMY FUSION CERVICAL FOUR-FIVE ,CERVICAL FIVE-SIX,CERVICAL SIX-SEVEN;  Surgeon: Donalee Citrin, MD;  Location: [facility] OR;  Service: Neurosurgery;  Laterality: N/A;   BRONCHIAL BIOPSY  06/14/2023   Procedure: BRONCHIAL BIOPSIES;  Surgeon: Leslye Peer, MD;  Location: [facility] ENDOSCOPY;  Service: Pulmonary;;   BRONCHIAL BRUSHINGS  06/14/2023   Procedure: BRONCHIAL BRUSHINGS;  Surgeon: Leslye Peer, MD;  Location: [facility] ENDOSCOPY;  Service: Pulmonary;;   BRONCHIAL NEEDLE ASPIRATION BIOPSY  06/14/2023   Procedure: BRONCHIAL NEEDLE ASPIRATION BIOPSIES;  Surgeon: Leslye Peer, MD;  Location: [facility] ENDOSCOPY;  Service:  Pulmonary;;   CARPAL TUNNEL RELEASE Left 09/27/2017   Procedure: CARPAL TUNNEL RELEASE;  Surgeon: Donalee Citrin, MD;  Location: [facility] OR;  Service: Neurosurgery;  Laterality: Left;   DILATATION & CURETTAGE/HYSTEROSCOPY WITH MYOSURE N/A 06/03/2021   Procedure: DILATATION & CURETTAGE/HYSTEROSCOPY WITH MYOSURE;  Surgeon: Genia Del, MD;  Location: [facility];  Service: Gynecology;  Laterality: N/A;   FOOT SURGERY     bone removed from pinky toe   HEMOSTASIS CONTROL  06/14/2023   Procedure: HEMOSTASIS  CONTROL;  Surgeon: Leslye Peer, MD;  Location: [facility] ENDOSCOPY;  Service: Pulmonary;;   INGUINAL HERNIA REPAIR Right 1995   KNEE ARTHROSCOPY W/ MENISCAL REPAIR Left 06/21/2017   dr Netta Corrigan   LEG SURGERY  1972   MOHS SURGERY  2016   BCC of forehead   MOUTH SURGERY  2015   removal and cauterization pallete of cancerous lesion   TONSILLECTOMY  1979   TONSILLECTOMY  1977   TOTAL KNEE ARTHROPLASTY Left 01/26/2018   Procedure: LEFT TOTAL KNEE ARTHROPLASTY;  Surgeon: Ranee Gosselin, MD;  Location: [facility] ORS;  Service: Orthopedics;  Laterality: Left;   TUBAL LIGATION     VIDEO BRONCHOSCOPY WITH ENDOBRONCHIAL ULTRASOUND Bilateral 06/14/2023   Procedure: VIDEO BRONCHOSCOPY WITH ENDOBRONCHIAL ULTRASOUND;  Surgeon: Leslye Peer, MD;  Location: [facility] ENDOSCOPY;  Service: Pulmonary;  Laterality: Bilateral;    Family History  Problem Relation Age of Onset   Diabetes Mother    Heart Problems Mother    Rheum arthritis Mother    COPD Mother    Polycystic kidney disease Mother    Carpal tunnel syndrome Mother    Cancer Father        Liver Cancer   Liver cancer Father    Carpal tunnel syndrome Sister    Thyroid disease Sister    Rheum arthritis Sister    Lung disease Sister    Diabetes Sister    Carpal tunnel syndrome Sister    Clotting disorder Sister    Rheum arthritis Sister    Thyroid disease Sister    COPD Sister    Heart Problems Sister    Gout Sister    Cancer Brother    Heart Problems Maternal Grandmother    Cancer Maternal Grandfather        Lung Cancer   Heart Problems Maternal Grandfather    Heart Problems Paternal Grandmother    Cancer Paternal Grandfather        Brain & Skin cancer   Polycystic ovary syndrome Daughter    Rheum arthritis Maternal Aunt    Fibromyalgia Maternal Aunt    Polycystic kidney disease Maternal Aunt    Heart Problems Maternal Aunt    Anemia Maternal Aunt    Adrenal disorder Maternal Aunt    Carpal tunnel syndrome Maternal Aunt    Diabetes  Maternal Aunt    Thyroid disease Maternal Aunt    Rheum arthritis Maternal Aunt    Diabetes Maternal Aunt    Diabetes Maternal Aunt    Rheum arthritis Maternal Aunt    Heart Problems Maternal Aunt    Rheum arthritis Maternal Uncle    Heart Problems Maternal Uncle     Social History:  reports that she quit smoking about 13 years ago. Her smoking use included cigarettes. She started smoking about 48 years ago. She has a 17.5 pack-year smoking history. She has never used smokeless tobacco. She reports that she does not currently use alcohol. She reports that she does not use drugs.  Allergies:  Allergies  Allergen Reactions   Influenza Vaccines Anaphylaxis and Other (See Comments)    Per patient   Penicillins Anaphylaxis and Other (See Comments)   Articaine Other (See Comments)    Caused infection   Cortisone Other (See Comments)    Turned red and ran a low grade fever for 3 days   Other Rash    bandaids- skin turns red and a rash     Medications: I have reviewed the patient's current medications. Prior to Admission:  Medications Prior to Admission  Medication Sig Dispense Refill Last Dose   albuterol (VENTOLIN HFA) 108 (90 Base) MCG/ACT inhaler Inhale 2 puffs into the lungs every 6 (six) hours as needed. 18 g 2 Past Month   fentaNYL (DURAGESIC) 25 MCG/HR Place 1 patch onto the skin every 3 (three) days. 5 patch 0 Past Week   furosemide (LASIX) 40 MG tablet Take 1.5 tablets (60 mg total) by mouth daily. 135 tablet 3 unknown   HYDROmorphone (DILAUDID) 2 MG tablet Take 1 tablet (2 mg total) by mouth every 3 (three) hours as needed for severe pain. 120 tablet 0 Past Week   ibuprofen (ADVIL) 600 MG tablet Take by mouth.   Past Week   ondansetron (ZOFRAN) 8 MG tablet Take 1 tablet (8 mg total) by mouth every 8 (eight) hours as needed for nausea or vomiting. 30 tablet 3 Past Week   prochlorperazine (COMPAZINE) 10 MG tablet Take 1 tablet by mouth every 6 (six) hours as needed.   Past Week    promethazine (PHENERGAN) 25 MG suppository Place 1 suppository (25 mg total) rectally every 6 (six) hours as needed for nausea or vomiting. 12 each 0 Past Week   promethazine (PHENERGAN) 25 MG tablet Take 1 tablet (25 mg total) by mouth every 6 (six) hours as needed for nausea or vomiting. 120 tablet 1 Past Week   scopolamine (TRANSDERM-SCOP) 1 MG/3DAYS Place 1 patch (1.5 mg total) onto the skin every 3 (three) days. 10 patch 1 Past Week   progesterone (PROMETRIUM) 200 MG capsule Nightly 10 days every 3 months 30 capsule 4     Results for orders placed or performed during the hospital encounter of 06/20/23 (from the past 48 hour(s))  CBC with Differential/Platelet     Status: Abnormal   Collection Time: 06/20/23 11:55 AM  Result Value Ref Range   WBC 12.1 (H) 4.0 - 10.5 K/uL   RBC 4.45 3.87 - 5.11 MIL/uL   Hemoglobin 11.8 (L) 12.0 - 15.0 g/dL   HCT 40.9 81.1 - 91.4 %   MCV 83.1 80.0 - 100.0 fL   MCH 26.5 26.0 - 34.0 pg   MCHC 31.9 30.0 - 36.0 g/dL   RDW 78.2 95.6 - 21.3 %   Platelets 492 (H) 150 - 400 K/uL   nRBC 0.0 0.0 - 0.2 %   Neutrophils Relative % 75 %   Neutro Abs 9.0 (H) 1.7 - 7.7 K/uL   Lymphocytes Relative 14 %   Lymphs Abs 1.7 0.7 - 4.0 K/uL   Monocytes Relative 11 %   Monocytes Absolute 1.3 (H) 0.1 - 1.0 K/uL   Eosinophils Relative 0 %   Eosinophils Absolute 0.0 0.0 - 0.5 K/uL   Basophils Relative 0 %   Basophils Absolute 0.0 0.0 - 0.1 K/uL   Immature Granulocytes 0 %   Abs Immature Granulocytes 0.05 0.00 - 0.07 K/uL    Comment: Performed at [facility], [address], [place], [place] [address]  Comprehensive metabolic panel  Status: Abnormal   Collection Time: 06/20/23 11:55 AM  Result Value Ref Range   Sodium 134 (L) 135 - 145 mmol/L   Potassium 3.1 (L) 3.5 - 5.1 mmol/L   Chloride 96 (L) 98 - 111 mmol/L   CO2 29 22 - 32 mmol/L   Glucose, Bld 118 (H) 70 - 99 mg/dL    Comment: Glucose reference range applies only to samples taken after fasting for at least  8 hours.   BUN 11 8 - 23 mg/dL   Creatinine, Ser 1.61 0.44 - 1.00 mg/dL   Calcium 8.7 (L) 8.9 - 10.3 mg/dL   Total Protein 6.5 6.5 - 8.1 g/dL   Albumin 2.8 (L) 3.5 - 5.0 g/dL   AST 31 15 - 41 U/L   ALT 65 (H) 0 - 44 U/L   Alkaline Phosphatase 112 38 - 126 U/L   Total Bilirubin 1.1 0.3 - 1.2 mg/dL   GFR, Estimated >09 >60 mL/min    Comment: (NOTE) Calculated using the CKD-EPI Creatinine Equation (2021)    Anion gap 9 5 - 15    Comment: Performed at [facility], [address], [place], [place] [address]  Lipase, blood     Status: None   Collection Time: 06/20/23 11:55 AM  Result Value Ref Range   Lipase 45 11 - 51 U/L    Comment: Performed at [facility], [address] [place], [place] [address]  Comprehensive metabolic panel     Status: Abnormal   Collection Time: 06/21/23  5:50 AM  Result Value Ref Range   Sodium 133 (L) 135 - 145 mmol/L   Potassium 3.2 (L) 3.5 - 5.1 mmol/L   Chloride 97 (L) 98 - 111 mmol/L   CO2 26 22 - 32 mmol/L   Glucose, Bld 99 70 - 99 mg/dL    Comment: Glucose reference range applies only to samples taken after fasting for at least 8 hours.   BUN 11 8 - 23 mg/dL   Creatinine, Ser 4.78 0.44 - 1.00 mg/dL   Calcium 8.3 (L) 8.9 - 10.3 mg/dL   Total Protein 6.0 (L) 6.5 - 8.1 g/dL   Albumin 2.5 (L) 3.5 - 5.0 g/dL   AST 33 15 - 41 U/L   ALT 61 (H) 0 - 44 U/L   Alkaline Phosphatase 108 38 - 126 U/L   Total Bilirubin 1.2 0.3 - 1.2 mg/dL   GFR, Estimated >29 >56 mL/min    Comment: (NOTE) Calculated using the CKD-EPI Creatinine Equation (2021)    Anion gap 10 5 - 15    Comment: Performed at [facility], [address] [place], [place] [address]  Magnesium     Status: None   Collection Time: 06/21/23  5:50 AM  Result Value Ref Range   Magnesium 1.9 1.7 - 2.4 mg/dL    Comment: Performed at [facility], [address], [place], [place] [address]  CBC with Differential/Platelet     Status: Abnormal   Collection Time: 06/21/23  5:50 AM  Result Value Ref  Range   WBC 10.9 (H) 4.0 - 10.5 K/uL   RBC 4.04 3.87 - 5.11 MIL/uL   Hemoglobin 10.5 (L) 12.0 - 15.0 g/dL   HCT 69.6 (L) 29.5 - 28.4 %   MCV 84.7 80.0 - 100.0 fL   MCH 26.0 26.0 - 34.0 pg   MCHC 30.7 30.0 - 36.0 g/dL   RDW 13.2 44.0 - 10.2 %   Platelets 480 (H) 150 - 400 K/uL  nRBC 0.0 0.0 - 0.2 %   Neutrophils Relative % 73 %   Neutro Abs 8.0 (H) 1.7 - 7.7 K/uL   Lymphocytes Relative 15 %   Lymphs Abs 1.7 0.7 - 4.0 K/uL   Monocytes Relative 11 %   Monocytes Absolute 1.2 (H) 0.1 - 1.0 K/uL   Eosinophils Relative 0 %   Eosinophils Absolute 0.0 0.0 - 0.5 K/uL   Basophils Relative 0 %   Basophils Absolute 0.0 0.0 - 0.1 K/uL   Immature Granulocytes 1 %   Abs Immature Granulocytes 0.06 0.00 - 0.07 K/uL    Comment: Performed at [facility], [address], [place], [place] [address]  Procalcitonin     Status: None   Collection Time: 06/21/23  5:50 AM  Result Value Ref Range   Procalcitonin 1.71 ng/mL    Comment:        Interpretation: PCT > 0.5 ng/mL and <= 2 ng/mL: Systemic infection (sepsis) is possible, but other conditions are known to elevate PCT as well. (NOTE)       Sepsis PCT Algorithm           Lower Respiratory Tract                                      Infection PCT Algorithm    ----------------------------     ----------------------------         PCT < 0.25 ng/mL                PCT < 0.10 ng/mL          Strongly encourage             Strongly discourage   discontinuation of antibiotics    initiation of antibiotics    ----------------------------     -----------------------------       PCT 0.25 - 0.50 ng/mL            PCT 0.10 - 0.25 ng/mL               OR       >80% decrease in PCT            Discourage initiation of                                            antibiotics      Encourage discontinuation           of antibiotics    ----------------------------     -----------------------------         PCT >= 0.50 ng/mL              PCT 0.26 - 0.50 ng/mL                 AND       <80% decrease in PCT             Encourage initiation of                                             antibiotics       Encourage continuation           of antibiotics    ----------------------------     -----------------------------  PCT >= 0.50 ng/mL                  PCT > 0.50 ng/mL               AND         increase in PCT                  Strongly encourage                                      initiation of antibiotics    Strongly encourage escalation           of antibiotics                                     -----------------------------                                           PCT <= 0.25 ng/mL                                                 OR                                        > 80% decrease in PCT                                      Discontinue / Do not initiate                                             antibiotics  Performed at [facility], [address] [place], [place] [address]     DG Abd 1 View  Result Date: 06/21/2023 CLINICAL DATA:  Small bowel obstruction. EXAM: ABDOMEN - 1 VIEW COMPARISON:  June 20, 2023. FINDINGS: No abnormal bowel dilatation is noted. Surgical clips are noted in the pelvis. IMPRESSION: No abnormal bowel dilatation. Electronically Signed   By: Lupita Raider M.D.   On: 06/21/2023 09:53   CT ABDOMEN PELVIS W CONTRAST  Result Date: 06/20/2023 CLINICAL DATA:  Abdominal pain for several weeks. History of lung cancer. * Tracking Code: BO * EXAM: CT ABDOMEN AND PELVIS WITH CONTRAST TECHNIQUE: Multidetector CT imaging of the abdomen and pelvis was performed using the standard protocol following bolus administration of intravenous contrast. RADIATION DOSE REDUCTION: This exam was performed according to the departmental dose-optimization program which includes automated exposure control, adjustment of the mA and/or kV according to patient size and/or use of iterative reconstruction technique. CONTRAST:  OMNIPAQUE  IOHEXOL 300 MG/ML  SOLN COMPARISON:  06/11/2023 FINDINGS: Lower chest: Rapidly enlarging mass within the left hilar region is identified. This now measures at least 5.9 x 5.0 cm, image 9/4. On the PET-CT from 05/27/23 this measured 5.7  x 3.5 cm. New small right pleural effusion. Hepatobiliary: There is no suspicious liver abnormality. The gallbladder appears normal. No bile duct dilatation. Pancreas: Unremarkable. Spleen: No focal splenic abnormality. Spleen measures 12.6 cm cranial caudal Adrenals/Urinary Tract: Large solid and cystic mass centered around the right adrenal gland measures 10.3 by 7.9 by 13.7, image 22/2. On 06/11/2023 10.0 by 7.4 by 13.1 cm. Normal left adrenal gland. The kidneys appear unremarkable. No signs of nephrolithiasis, hydronephrosis or mass. Urinary bladder appears normal. Stomach/Bowel: Stomach appears within normal limits. Progressive distension of the small bowel measure up to 2.9 cm with a few air-fluid levels. The distal small bowel loops have a normal caliber up to the ileocecal valve. No bowel wall thickening or inflammation. Normal caliber of the colon. Vascular/Lymphatic: Aortic atherosclerosis. Aortocaval lymph node measures 1 cm, image 33/2. No pelvic or inguinal adenopathy. Reproductive: Small fibroid arises off the subserosal right uterine corpus measuring 2.1 cm, image 79/2. The endometrial stripe measures 9 mm in thickness which is considered abnormal in a postmenopausal female. No adnexal mass. Other: No significant free fluid or fluid collections. Musculoskeletal: No acute or significant osseous findings. IMPRESSION: 1. Progressive distension of the small bowel measure up to 2.9 cm with a few air-fluid levels. The distal small bowel loops have a normal caliber up to the ileocecal valve. Although nonspecific these findings may reflect developing small bowel ileus or early small-bowel obstruction. Consider serial follow-up radiographs of the abdomen. 2. Enlarging mass  within the left hilar region is identified. The visualized portions in the left lower lobe measures 5.9 x 5.0 cm. On the PET-CT from 05/27/23 the perihilar portion measured up to 5.7 x 3.5 cm. 3. 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 On 06/11/2023 10.0 by 7.4 by 13.1 cm. 4. New small right pleural effusion. 5. Endometrial stripe measures 9 mm in thickness which is considered abnormal in a postmenopausal female. Consider further evaluation with pelvic ultrasound. 6.  Aortic Atherosclerosis (ICD10-I70.0). Electronically Signed   By: Signa Kell M.D.   On: 06/20/2023 17:50    ROS:  Pertinent items are noted in HPI.  Blood pressure 126/78, pulse (!) 113, temperature 99.1 F (37.3 C), temperature source Oral, resp. rate 20, height 5\' 6"  (1.676 m), weight 103 kg, SpO2 97%. Physical Exam: Tearful white female in no acute distress Head is normocephalic, atraumatic Abdomen is soft but slightly distended.  No point tenderness is noted.  No rigidity is noted.  CT scan images personally reviewed  Assessment/Plan: Impression: History of C. difficile colitis with nonspecific nausea/vomiting/abdominal pain/decreased appetite.  CT scan findings are most consistent with an ileus like picture.  I do not see any evidence of mechanical obstruction at the present time.  She was scheduled undergo an upper endoscopy by GI in [place].  She is asked that this be done during this admission.  This was discussed with Dr. Ophelia Charter who will consult GI.  Will follow peripherally with you.  Franky Macho 06/21/2023, 11:51 AM

## 2023-06-21 NOTE — Assessment & Plan Note (Deleted)
-   With associated leukocytosis 12.1 - Zofran x 2 given in the ED - Continue as needed Zofran - CT abdomen pelvis shows possible SBO or ileus - X-ray this a.m. to monitor progression - N.p.o. - Continue to monitor

## 2023-06-21 NOTE — Hospital Course (Signed)
62yo female with h/o adrenal hemorrhage, postmenopausal vaginal bleeding, and C. Difficile infection 1 month ago who presented on 8/19 with abdominal pain.  CT with possible ileus.  Persistent n/v that has been intractable.

## 2023-06-21 NOTE — Plan of Care (Signed)
  Problem: Education: Goal: Knowledge of General Education information will improve Description: Including pain rating scale, medication(s)/side effects and non-pharmacologic comfort measures Outcome: Progressing   Problem: Coping: Goal: Level of anxiety will decrease Outcome: Progressing   Problem: Safety: Goal: Ability to remain free from injury will improve Outcome: Progressing   

## 2023-06-22 ENCOUNTER — Other Ambulatory Visit (HOSPITAL_COMMUNITY): Payer: Self-pay

## 2023-06-22 ENCOUNTER — Ambulatory Visit: Admitting: "Endocrinology

## 2023-06-22 ENCOUNTER — Inpatient Hospital Stay

## 2023-06-22 DIAGNOSIS — A0472 Enterocolitis due to Clostridium difficile, not specified as recurrent: Secondary | ICD-10-CM

## 2023-06-22 DIAGNOSIS — E66812 Obesity, class 2: Secondary | ICD-10-CM

## 2023-06-22 DIAGNOSIS — E6609 Other obesity due to excess calories: Secondary | ICD-10-CM

## 2023-06-22 DIAGNOSIS — E669 Obesity, unspecified: Secondary | ICD-10-CM

## 2023-06-22 LAB — CBC WITH DIFFERENTIAL/PLATELET
Abs Immature Granulocytes: 0.04 10*3/uL (ref 0.00–0.07)
Basophils Absolute: 0 10*3/uL (ref 0.0–0.1)
Basophils Relative: 0 %
Eosinophils Absolute: 0 10*3/uL (ref 0.0–0.5)
Eosinophils Relative: 0 %
HCT: 34.2 % — ABNORMAL LOW (ref 36.0–46.0)
Hemoglobin: 10.7 g/dL — ABNORMAL LOW (ref 12.0–15.0)
Immature Granulocytes: 0 %
Lymphocytes Relative: 14 %
Lymphs Abs: 1.4 10*3/uL (ref 0.7–4.0)
MCH: 26.4 pg (ref 26.0–34.0)
MCHC: 31.3 g/dL (ref 30.0–36.0)
MCV: 84.2 fL (ref 80.0–100.0)
Monocytes Absolute: 1 10*3/uL (ref 0.1–1.0)
Monocytes Relative: 10 %
Neutro Abs: 7.6 10*3/uL (ref 1.7–7.7)
Neutrophils Relative %: 76 %
Platelets: 442 10*3/uL — ABNORMAL HIGH (ref 150–400)
RBC: 4.06 MIL/uL (ref 3.87–5.11)
RDW: 14.9 % (ref 11.5–15.5)
WBC: 10.1 10*3/uL (ref 4.0–10.5)
nRBC: 0 % (ref 0.0–0.2)

## 2023-06-22 LAB — BASIC METABOLIC PANEL
Anion gap: 8 (ref 5–15)
BUN: 9 mg/dL (ref 8–23)
CO2: 24 mmol/L (ref 22–32)
Calcium: 8.1 mg/dL — ABNORMAL LOW (ref 8.9–10.3)
Chloride: 101 mmol/L (ref 98–111)
Creatinine, Ser: 0.68 mg/dL (ref 0.44–1.00)
GFR, Estimated: >60 mL/min (ref >60)
Glucose, Bld: 100 mg/dL — ABNORMAL HIGH (ref 70–99)
Potassium: 3.4 mmol/L — ABNORMAL LOW (ref 3.5–5.1)
Sodium: 133 mmol/L — ABNORMAL LOW (ref 135–145)

## 2023-06-22 MED ORDER — BOOST / RESOURCE BREEZE PO LIQD CUSTOM: 1.0000 | Freq: Three times a day (TID) | ORAL | Status: DC

## 2023-06-22 MED ORDER — ENOXAPARIN SODIUM 40 MG/0.4ML IJ SOSY
40.0000 mg | PREFILLED_SYRINGE | INTRAMUSCULAR | Status: DC
  Administered 2023-06-22 – 2023-06-25 (×4): 40 mg via SUBCUTANEOUS
  Filled 2023-06-22 (×3): qty 0.4

## 2023-06-22 MED ORDER — GUAIFENESIN ER 600 MG PO TB12
1200.0000 mg | ORAL_TABLET | Freq: Two times a day (BID) | ORAL | Status: DC
  Administered 2023-06-22 – 2023-06-26 (×8): 1200 mg via ORAL
  Filled 2023-06-22 (×10): qty 2

## 2023-06-22 MED ORDER — ADULT MULTIVITAMIN W/MINERALS CH
1.0000 | ORAL_TABLET | Freq: Every day | ORAL | Status: DC
  Administered 2023-06-22 – 2023-06-26 (×4): 1 via ORAL
  Filled 2023-06-22 (×5): qty 1

## 2023-06-22 MED ORDER — SACCHAROMYCES BOULARDII 250 MG PO CAPS
250.0000 mg | ORAL_CAPSULE | Freq: Two times a day (BID) | ORAL | Status: DC
  Administered 2023-06-22 – 2023-06-26 (×7): 250 mg via ORAL
  Filled 2023-06-22 (×8): qty 1

## 2023-06-22 MED ORDER — PROSOURCE PLUS PO LIQD
30.0000 mL | Freq: Two times a day (BID) | ORAL | Status: DC
  Administered 2023-06-22: 30 mL via ORAL
  Filled 2023-06-22 (×4): qty 30

## 2023-06-22 NOTE — Progress Notes (Signed)
Progress Note   Patient: Miranda Perkins MWN:[id number] DOB: 1961/03/26 DOA: 06/20/2023     1 DOS: the patient was seen and examined on 06/22/2023   Brief hospital course: 62yo female with h/o adrenal hemorrhage, postmenopausal vaginal bleeding, and C. Difficile infection 1 month ago who presented on 8/19 with abdominal pain.  CT with possible ileus.  Persistent n/v that has been intractable.  Assessment and Plan: * recent history of C. difficile colitis Patient with recent history of C difficile colitis It was treated with 10 days of Dificid but symptoms never completely resolved - still having periodic diarrhea as well as recent intractable n/v Imaging with concern for ileus Suspect that this is persistent (not recurrent) C diff infection Leukocytosis/elevated procalcitonin likely related to this issue Discussed with Dr. Ninetta Lights from ID - further testing is not needed, will repeat course of treatment Patient reports nausea from Dificid and would like an alternate treatment option, so will treat with PO Vancomycin x 10 days Repeat AXR unremarkable  Hypokalemia Potassium 3.1 in the ED, repleted and still low this AM at 3.2 Will give an additional 40 mEq PO KCl at time Repeat BMP in AM  Malignant neoplasm of unspecified part of unspecified bronchus or lung (HCC) Recently diagnosed in July with bronchoscopy with biopsy performed on Monday, 8/12 by Dr. 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    PET scan done 7/25 1. Intensely hypermetabolic solid 5.7 cm posterior left perihilar lung mass, compatible with primary bronchogenic malignancy. 2. 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 3. Mildly hypermetabolic aortocaval retroperitoneal adenopathy, suspicious for nodal metastasis. 4. Intensely hypermetabolic heterogeneous hypodense 1.7 cm right thyroid nodule, suspicious for thyroid  malignancy. 5)  Hypermetabolic superficial subcutaneous 0.7 cm soft tissue lesion in the medial proximal right upper extremity, nonspecific, cannot exclude soft tissue metastasis.   Thyroid US 7/9 with no evidence of malignancy and stable node size  Oncology to see s/p biopsy  Continue oral Dilaudid as needed for pain.  Fentanyl patch was prescribed per recommendation from palliative care team but patient does not want to use this medication.  She reports cough productive of persistent thick sputum.  Will hold further evaluation for now, likely related to underlying malignancy.  Class 2 obesity due to excess calories with body mass index (BMI) of 36.0 to 36.9 in adult Nutrition Status: Nutrition Problem: Inadequate oral intake Etiology: inability to eat Signs/Symptoms: NPO status Interventions: Refer to RD note for recommendations  Obesity Body mass index is 36.65 kg/m.[name]  Weight loss should be encouraged Outpatient PCP/bariatric medicine f/u encouraged   Ileus [facility]) CT A/P with possible ileus Surgery consulting, non-surgical at this time        Consultants: Surgery  Procedures: None  Antibiotics: Oral vancomycin, 8/19-28   30 Day Unplanned Readmission Risk Score    Flowsheet Row ED to Hosp-Admission (Current) from 06/20/2023 in [facility] MEDICAL SURGICAL UNIT  30 Day Unplanned Readmission Risk Score (%) 16.5 Filed at 06/22/2023 0401       This score is the patient's risk of an unplanned readmission within 30 days of being discharged (0 -100%). The score is based on dignosis, age, lab data, medications, orders, and past utilization.   Low:  0-14.9   Medium: 15-21.9   High: 22-29.9   Extreme: 30 and above           Subjective: Feels slightly better today. Still coughing up phlegm.  Objective: Vitals:   06/22/23 1049 06/22/23 1228  BP: 121/71 106/60  Pulse: (!) 105 (!) 110  Resp:    Temp:  98.8 F (37.1 C)  SpO2: 98% 97%    Intake/Output Summary  (Last 24 hours) at 06/22/2023 1651 Last data filed at 06/22/2023 1300 Gross per 24 hour  Intake 2511.59 ml  Output --  Net 2511.59 ml   Filed Weights   06/20/23 1049  Weight: 103 kg    Exam:   General:  Appears calm and comfortable and is in NAD Eyes:  EOMI, normal lids, iris ENT:  grossly normal hearing, lips & tongue, mmm Neck:  no LAD, masses or thyromegaly Cardiovascular:  RRR, no m/r/g. No LE edema.  Respiratory:   CTA bilaterally with no wheezes/rales/rhonchi.  Normal respiratory effort. Abdomen:  soft, mildly diffusely tender, ND, NABS Skin:  no rash or induration seen on limited exam Musculoskeletal:  grossly normal tone BUE/BLE, good ROM, no bony abnormality Psychiatric:  grossly normal mood and affect, speech fluent and appropriate, AOx3 Neurologic:  CN 2-12 grossly intact, moves all extremities in coordinated fashion   Data Reviewed: I have reviewed the patient's lab results since admission.  Pertinent labs for today include:  K+ 3.4 WBC 10.1 Hgb 10.7 - stable Platelets 442 - improving     Family Communication: Husband present throughout evaluation  Disposition: Status is: Inpatient Remains inpatient appropriate because: ongoing evaluation and treatment  Planned Discharge Destination: Home    Time spent: 50 minutes  Author: Jonah Blue, MD 06/22/2023 4:51 PM  For on call review [email or web address].

## 2023-06-22 NOTE — Progress Notes (Signed)
Nutrition Follow-up  DOCUMENTATION CODES:   Obesity unspecified  INTERVENTION:   -Boost Breeze po TID, each supplement provides 250 kcal and 9 grams of protein  -30 ml Prosource Plus BID, each supplement provides 100 kcals and 15 grams protein -MVI with minerals daily -RD will follow for diet advancement and adjust supplement regimen as appropriate  NUTRITION DIAGNOSIS:   Inadequate oral intake related to inability to eat as evidenced by NPO status.  Ongoing  GOAL:   Patient will meet greater than or equal to 90% of their needs  Progressing   MONITOR:   Diet advancement  REASON FOR ASSESSMENT:   Malnutrition Screening Tool    ASSESSMENT:   Pt with medical history significant of adrenal hemorrhage, postmenopausal vaginal bleeding, history of lung biopsy, history of C. difficile, and more presents with a chief complaint of abdominal pain and back pain.  8/19- advanced to clear liquid diet  Reviewed I/O's: +984 ml x 24 hours and +2.2 L since admission   Pt unavailable at time of visit. Attempted to speak with pt via call to hospital room phone, however, unable to reach. RD unable to obtain further nutrition-related history or complete nutrition-focused physical exam at this time.    Per general surgery notes, CT scan findings consistent with ileus with no evidence of mechanical obstruction. Plan to consult GI during this admission, as pt was scheduled for an upper endoscopy as an outpatient.   Per PCCM notes, EBUS samples pending, but preliminary data consistent with malignancy.   Pt currently on a clear liquid diet. No meal completion data available to assess at this time. Pt would greatly benefit from addition of oral nutrition supplements.   Reviewed wt hx; pt has experinced a 8% wt loss over the past 2 months, which is significant for time frame. Pt is at high risk for malnutrition, however, unable to identify at this time. Pt also with generalized edema per nursing  assessment, which may be masking true weight loss as well as fat and muscle depletions.    Obesity is a complex, chronic medical condition that is optimally managed by a multidisciplinary care team. Weight loss is not an ideal goal for an acute inpatient hospitalization. However, if further work-up for obesity is warranted, consider outpatient referral to [facility].    Medications reviewed.   Labs reviewed: Na: 133, K: 3.4.    Diet Order:   Diet Order             Diet clear liquid Room service appropriate? Yes; Fluid consistency: Thin  Diet effective now                   EDUCATION NEEDS:   No education needs have been identified at this time  Skin:  Skin Assessment: Reviewed RN Assessment  Last BM:  06/18/23  Height:   Ht Readings from Last 1 Encounters:  06/20/23 5\' 6"  (1.676 m)    Weight:   Wt Readings from Last 1 Encounters:  06/20/23 103 kg    Ideal Body Weight:  59.1 kg  BMI:  Body mass index is 36.65 kg/m.  Estimated Nutritional Needs:   Kcal:  1750-1950  Protein:  90-105 grams  Fluid:  > 1.7 L    Levada Schilling, RD, LDN, CDCES Registered Dietitian II Certified Diabetes Care and Education Specialist Please refer to [facility] for RD and/or RD on-call/weekend/after hours pager

## 2023-06-22 NOTE — Assessment & Plan Note (Signed)
Nutrition Status: Nutrition Problem: Inadequate oral intake Etiology: inability to eat Signs/Symptoms: NPO status Interventions: Refer to RD note for recommendations  Obesity Body mass index is 36.65 kg/m.[name]  Weight loss should be encouraged Outpatient PCP/bariatric medicine f/u encouraged

## 2023-06-22 NOTE — Plan of Care (Signed)

## 2023-06-22 NOTE — TOC Benefit Eligibility Note (Signed)
Patient Product/process development scientist completed.    The patient is insured through [employer].     Ran test claim for vancomycin 125 mc capsules and the current 10 day co-pay is $16.00.   This test claim was processed through [facility]- copay amounts may vary at other pharmacies due to [employer], or as the patient moves through the different stages of their insurance plan.     Roland Earl, CPHT Pharmacy Technician III Certified Patient Advocate [facility] Patient Advocate Team Direct Number: [phone]  Fax: [phone]

## 2023-06-23 ENCOUNTER — Inpatient Hospital Stay

## 2023-06-23 ENCOUNTER — Inpatient Hospital Stay: Admitting: Hematology

## 2023-06-23 DIAGNOSIS — C3492 Malignant neoplasm of unspecified part of left bronchus or lung: Secondary | ICD-10-CM | POA: Diagnosis not present

## 2023-06-23 DIAGNOSIS — R112 Nausea with vomiting, unspecified: Secondary | ICD-10-CM | POA: Diagnosis not present

## 2023-06-23 DIAGNOSIS — A0472 Enterocolitis due to Clostridium difficile, not specified as recurrent: Secondary | ICD-10-CM | POA: Diagnosis not present

## 2023-06-23 NOTE — Consult Note (Signed)
[facility] Consultation Oncology  Name: Miranda Perkins      MRN: [id number]    Location: A320/A320-01  Date: 06/23/2023 Time:4:10 PM   REFERRING PHYSICIAN: Dr. 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 difficile colitis, nausea/vomiting  HISTORY OF PRESENT ILLNESS: Miranda Perkins is a 63 year old female known to me from outpatient visits.  She underwent bronchoscopy and biopsy of the left hilar mass and endobronchial biopsy of the left lower lobe endobronchial lesion on 06/14/2023 by Dr. Delton Coombes.  She was recently admitted on 06/20/2023 with abdominal pain.  CTAP on 06/20/2023: Progressive distention of the small bowel measuring up to 2.9 cm with a few air-fluid levels.  Distal small bowel loops have a normal caliber up to the ileocecal valve.  Enlarging mass within the left hilar region measuring 5.9 x 5 cm.  Large solid/cystic right adrenal gland metastasis measured 10.3 x 7.9 x 13.7 cm.  She was also recently diagnosed with C. difficile colitis and was treated with 10 days of Dificid.  As her symptoms were persisting, she was treated with oral vancomycin x 10 days.  She is continuing morphine for abdominal pain.  PAST MEDICAL HISTORY:   Past Medical History:  Diagnosis Date   Adrenal hemorrhage (HCC)    Anemia    Arthritis    Cancer (HCC)    skin cancer - basal   Dyspnea    Edema of both lower extremities    Endometrial polyp    Hirsutism    History of 2019 novel coronavirus disease (COVID-19) 12/21/2020   positive home result documented in pcp note in epic 12-23-2020,  residual doe, pulmonology consult w/ dr wert note in epic 03-20-2021   History of basal cell carcinoma (BCC) excision 2016   forehead, per pt no recurrence   History of CVA (cerebrovascular accident) without residual deficits 1993   pt stated , while on Chloramphenicol, no residuaL; [medication given to treat tick bite] ; patient states  "i  stroked out right next to my doctor , he said i passed out and that was my only symptom" ;  no resiudal   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 branch,  work-up results in epic 03/ 2022 (normal nuclear stress test, normal echo, no arrhythmia's per event monitor);   (05-28-2021 per pt no symptoms since 03/ 2022)   Multiple thyroid nodules    endocrinologist--- dr g. nida,  hx benign bx, clinically euthyroid   Osteopenia 12/2017   T score -1.2 FRAX 4.9% / 0.3%   PMB (postmenopausal bleeding)    Pneumonia    x 2   Post-COVID chronic dyspnea    Pre-diabetes    Rosacea    Stroke (HCC)    Thickened endometrium     ALLERGIES: Allergies  Allergen Reactions   Influenza Vaccines Anaphylaxis and Other (See Comments)    Per patient   Penicillins Anaphylaxis and Other (See Comments)   Articaine Other (See Comments)    Caused infection   Cortisone Other (See Comments)    Turned red and ran a low grade fever for 3 days   Other Rash    bandaids- skin turns red and a rash       MEDICATIONS: I have reviewed the patient's current medications.  PAST SURGICAL HISTORY Past Surgical History:  Procedure Laterality Date   ANTERIOR AND POSTERIOR REPAIR N/A 08/21/2020   Procedure: ANTERIOR (CYSTOCELE) AND POSTERIOR REPAIR (RECTOCELE);  Surgeon: Genia Del, MD;  Location: [facility];  Service: Gynecology;  Laterality: N/A;  requesting 9:00am OR time  requests one hour   ANTERIOR CERVICAL DECOMP/DISCECTOMY FUSION N/A 09/27/2017   Procedure: ANTERIOR CERVICAL DECOMPRESSION/DISCECTOMY FUSION CERVICAL FOUR-FIVE ,CERVICAL FIVE-SIX,CERVICAL SIX-SEVEN;  Surgeon: Donalee Citrin, MD;  Location[facility] OR;  Service: Neurosurgery;  Laterality: N/A;   BRONCHIAL BIOPSY  06/14/2023   Procedure: BRONCHIAL BIOPSIES;  Surgeon: Leslye Peer,  MD;  Location: [facility] ENDOSCOPY;  Service: Pulmonary;;   BRONCHIAL BRUSHINGS  06/14/2023   Procedure: BRONCHIAL BRUSHINGS;  Surgeon: Leslye Peer, MD;  Location: [facility] ENDOSCOPY;  Service: Pulmonary;;   BRONCHIAL NEEDLE ASPIRATION BIOPSY  06/14/2023   Procedure: BRONCHIAL NEEDLE ASPIRATION BIOPSIES;  Surgeon: Leslye Peer, MD;  Location: [facility] ENDOSCOPY;  Service: Pulmonary;;   CARPAL TUNNEL RELEASE Left 09/27/2017   Procedure: CARPAL TUNNEL RELEASE;  Surgeon: Donalee Citrin, MD;  Location: [facility] OR;  Service: Neurosurgery;  Laterality: Left;   DILATATION & CURETTAGE/HYSTEROSCOPY WITH MYOSURE N/A 06/03/2021   Procedure: DILATATION & CURETTAGE/HYSTEROSCOPY WITH MYOSURE;  Surgeon: Genia Del, MD;  Location: [facility];  Service: Gynecology;  Laterality: N/A;   FOOT SURGERY     bone removed from pinky toe   HEMOSTASIS CONTROL  06/14/2023   Procedure: HEMOSTASIS CONTROL;  Surgeon: Leslye Peer, MD;  Location: [facility] ENDOSCOPY;  Service: Pulmonary;;   INGUINAL HERNIA REPAIR Right 1995   KNEE ARTHROSCOPY W/ MENISCAL REPAIR Left 06/21/2017   dr Netta Corrigan   LEG SURGERY  1972   MOHS SURGERY  2016   BCC of forehead   MOUTH SURGERY  2015   removal and cauterization pallete of cancerous lesion   TONSILLECTOMY  1979   TONSILLECTOMY  1977   TOTAL KNEE ARTHROPLASTY Left 01/26/2018   Procedure: LEFT TOTAL KNEE ARTHROPLASTY;  Surgeon: Ranee Gosselin, MD;  Location: [facility] ORS;  Service: Orthopedics;  Laterality: Left;   TUBAL LIGATION     VIDEO BRONCHOSCOPY WITH ENDOBRONCHIAL ULTRASOUND Bilateral 06/14/2023   Procedure: VIDEO BRONCHOSCOPY WITH ENDOBRONCHIAL ULTRASOUND;  Surgeon: Leslye Peer, MD;  Location: [facility] ENDOSCOPY;  Service: Pulmonary;  Laterality: Bilateral;    FAMILY HISTORY: Family History  Problem Relation Age of Onset   Diabetes Mother    Heart Problems Mother    Rheum arthritis Mother    COPD Mother    Polycystic kidney disease Mother    Carpal tunnel syndrome Mother     Cancer Father        Liver Cancer   Liver cancer Father    Carpal tunnel syndrome Sister    Thyroid disease Sister    Rheum arthritis Sister    Lung disease Sister    Diabetes Sister    Carpal tunnel syndrome Sister    Clotting disorder Sister    Rheum arthritis Sister    Thyroid disease Sister    COPD Sister    Heart Problems Sister    Gout Sister    Cancer Brother    Heart Problems Maternal Grandmother    Cancer Maternal Grandfather        Lung Cancer   Heart Problems Maternal Grandfather    Heart Problems Paternal Grandmother    Cancer Paternal Grandfather        Brain & Skin cancer   Polycystic ovary syndrome Daughter    Rheum  arthritis Maternal Aunt    Fibromyalgia Maternal Aunt    Polycystic kidney disease Maternal Aunt    Heart Problems Maternal Aunt    Anemia Maternal Aunt    Adrenal disorder Maternal Aunt    Carpal tunnel syndrome Maternal Aunt    Diabetes Maternal Aunt    Thyroid disease Maternal Aunt    Rheum arthritis Maternal Aunt    Diabetes Maternal Aunt    Diabetes Maternal Aunt    Rheum arthritis Maternal Aunt    Heart Problems Maternal Aunt    Rheum arthritis Maternal Uncle    Heart Problems Maternal Uncle     SOCIAL HISTORY:  reports that she quit smoking about 13 years ago. Her smoking use included cigarettes. She started smoking about 48 years ago. She has a 17.5 pack-year smoking history. She has never used smokeless tobacco. She reports that she does not currently use alcohol. She reports that she does not use drugs.  PERFORMANCE STATUS: The patient's performance status is 1 - Symptomatic but completely ambulatory  PHYSICAL EXAM: Most Recent Vital Signs: Blood pressure 116/70, pulse (!) 109, temperature 98.8 F (37.1 C), resp. rate 18, height 5\' 6"  (1.676 m), weight 227 lb 1.2 oz (103 kg), SpO2 97%. BP 116/70 (BP Location: Left Arm)   Pulse (!) 109   Temp 98.8 F (37.1 C)   Resp 18   Ht 5\' 6"  (1.676 m)   Wt 227 lb 1.2 oz (103 kg)   SpO2  97%   BMI 36.65 kg/m  General appearance: alert, cooperative, and appears stated age Lungs: clear to auscultation bilaterally Extremities:  3+ edema bilaterally Neurologic: Grossly normal  LABORATORY DATA:  Results for orders placed or performed during the hospital encounter of 06/20/23 (from the past 48 hour(s))  CBC with Differential/Platelet     Status: Abnormal   Collection Time: 06/22/23  4:11 AM  Result Value Ref Range   WBC 10.1 4.0 - 10.5 K/uL   RBC 4.06 3.87 - 5.11 MIL/uL   Hemoglobin 10.7 (L) 12.0 - 15.0 g/dL   HCT 65.7 (L) 84.6 - 96.2 %   MCV 84.2 80.0 - 100.0 fL   MCH 26.4 26.0 - 34.0 pg   MCHC 31.3 30.0 - 36.0 g/dL   RDW 95.2 84.1 - 32.4 %   Platelets 442 (H) 150 - 400 K/uL   nRBC 0.0 0.0 - 0.2 %   Neutrophils Relative % 76 %   Neutro Abs 7.6 1.7 - 7.7 K/uL   Lymphocytes Relative 14 %   Lymphs Abs 1.4 0.7 - 4.0 K/uL   Monocytes Relative 10 %   Monocytes Absolute 1.0 0.1 - 1.0 K/uL   Eosinophils Relative 0 %   Eosinophils Absolute 0.0 0.0 - 0.5 K/uL   Basophils Relative 0 %   Basophils Absolute 0.0 0.0 - 0.1 K/uL   Immature Granulocytes 0 %   Abs Immature Granulocytes 0.04 0.00 - 0.07 K/uL    Comment: Performed at [facility], [address] [place], [place] [address]  Basic metabolic panel     Status: Abnormal   Collection Time: 06/22/23  4:11 AM  Result Value Ref Range   Sodium 133 (L) 135 - 145 mmol/L   Potassium 3.4 (L) 3.5 - 5.1 mmol/L   Chloride 101 98 - 111 mmol/L   CO2 24 22 - 32 mmol/L   Glucose, Bld 100 (H) 70 - 99 mg/dL    Comment: Glucose reference range applies only to samples taken after fasting for at least  8 hours.   BUN 9 8 - 23 mg/dL   Creatinine, Ser 6.04 0.44 - 1.00 mg/dL   Calcium 8.1 (L) 8.9 - 10.3 mg/dL   GFR, Estimated >54 >09 mL/min    Comment: (NOTE) Calculated using the CKD-EPI Creatinine Equation (2021)    Anion gap 8 5 - 15    Comment: Performed at [facility], [address] [place], [place] [address]       RADIOGRAPHY: No results found.     PATHOLOGY: I reviewed pathology reports from 06/14/2023: Poorly differentiated malignancy  ASSESSMENT and PLAN:  1.  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 - We discussed PET scan results which showed 5.7 cm left lung perihilar mass, hypermetabolic hemorrhagic right adrenal metastasis.  Mildly hypermetabolic aortocaval retroperitoneal adenopathy suspicious for nodal metastasis.  1.7 cm right thyroid nodule hypermetabolic suspicious for malignancy.  Hypermetabolic superficial subcutaneous 0.7 cm soft tissue lesion in the medial aspect of the right upper extremity nonspecific. - MRI brain (06/16/2023): 2 mm focus of enhancement along the right central sulcus, indeterminate for a small cortical metastasis versus vascular enhancement.  Requires follow-up MRI in 8 weeks.  No other evidence of metastatic disease. - Recommend port placement and biopsy of the right axillary subcutaneous nodule by Dr. Lovell Sheehan. - Discussed initiation of palliative chemoimmunotherapy upon discharge. - She will also receive radiation therapy to the right regional for pain control. - Will send NGS testing on pathology. - Discussed with the patient and her husband.  All questions were answered. The patient knows to call the clinic with any problems, questions or concerns. We can certainly see the patient much sooner if necessary.    [name]

## 2023-06-23 NOTE — Progress Notes (Signed)
Progress Note   Patient: Miranda Perkins LKG:[id number] DOB: 19-Feb-1961 DOA: 06/20/2023     2 DOS: the patient was seen and examined on 06/23/2023   Brief hospital course: 62yo female with h/o adrenal hemorrhage, postmenopausal vaginal bleeding, and C. Difficile infection 1 month ago who presented on 8/19 with abdominal pain.  CT with possible ileus.  Persistent n/v that has been intractable.  No longer with n/v, tolerating full liquid diet.  Worsening diarrhea on 8/21.  Treating for persistent C. Diff with PO Vanc.  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  Assessment and Plan: * recent history of C. difficile colitis Patient with recent history of C difficile colitis It was treated with 10 days of Dificid but symptoms never completely resolved - still having periodic diarrhea as well as recent intractable n/v Imaging with concern for ileus Suspect that this is persistent (not recurrent) C diff infection Leukocytosis/elevated procalcitonin likely related to this issue Discussed with Dr. Ninetta Lights from ID - further testing is not needed, will repeat course of treatment Patient reports nausea from Dificid and would like an alternate treatment option, so will treat with PO Vancomycin x 10 days Repeat AXR unremarkable  Hypokalemia Potassium 3.1 in the ED, repleted and still low this AM at 3.2 Will give an additional 40 mEq PO KCl at time Repeat BMP in AM  Malignant neoplasm of unspecified part of unspecified bronchus or lung (HCC) Recently diagnosed in July with bronchoscopy with biopsy performed on Monday, 8/12 by Dr. 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 Intensely hypermetabolic solid 5.7 cm posterior left perihilar lung mass, compatible with primary bronchogenic malignancy. 2. 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 3.  Mildly hypermetabolic aortocaval retroperitoneal adenopathy, suspicious for nodal metastasis. 4. Intensely hypermetabolic heterogeneous hypodense 1.7 cm right thyroid nodule, suspicious for thyroid malignancy. 5)  Hypermetabolic superficial subcutaneous 0.7 cm soft tissue lesion in the medial proximal right upper extremity, nonspecific, cannot exclude soft tissue metastasis. Thyroid US 7/9 with no evidence of malignancy and stable node size  Oncology to see s/p biopsy - discussed with Dr. Ellin Saba and he will see her in the hospital Continue oral Dilaudid as needed for pain.  Fentanyl patch was prescribed per recommendation from palliative care team but patient does not want to use this medication. She reports cough productive of persistent thick sputum, likely related to underlying malignancy.  She reports significant improvement with addition of Mucinex.  Class 2 obesity due to excess calories with body mass index (BMI) of 36.0 to 36.9 in adult Nutrition Status: Nutrition Problem: Inadequate oral intake Etiology: inability to eat Signs/Symptoms: NPO status Interventions: Refer to RD note for recommendations  Obesity Body mass index is 36.65 kg/m.[name]  Weight loss should be encouraged Outpatient PCP/bariatric medicine f/u encouraged   Ileus [facility]) CT A/P with possible ileus Surgery consulting, non-surgical at this time         Consultants: Surgery Oncology   Procedures: None   Antibiotics: Oral vancomycin, 8/19-28   30 Day Unplanned Readmission Risk Score    Flowsheet Row ED to Hosp-Admission (Current) from 06/20/2023 in [facility] MEDICAL SURGICAL UNIT  30 Day Unplanned Readmission Risk Score (%) 17.64 Filed at 06/23/2023 0400       This score is the patient's risk of an unplanned readmission within 30 days of being discharged (0 -100%). The score is based on dignosis, age, lab  data, medications, orders, and past utilization.   Low:  0-14.9   Medium: 15-21.9   High:  22-29.9   Extreme: 30 and above           Subjective: Cough is better with mucinex.  Had more diarrhea this AM, the most she has had since hospitalization.  Otherwise tolerating full liquids and wants to continue this diet.  She was unaware of pathology but this has been reported.  Dr. Delton Coombes notified me and so is aware.  She knows Dr. Ellin Saba and he will come back to see her here.   Objective: Vitals:   06/23/23 0802 06/23/23 1347  BP: 112/69 116/70  Pulse: (!) 109 (!) 109  Resp:  18  Temp: 98.6 F (37 C) 98.8 F (37.1 C)  SpO2: 97% 97%    Intake/Output Summary (Last 24 hours) at 06/23/2023 1425 Last data filed at 06/23/2023 0800 Gross per 24 hour  Intake 1635.36 ml  Output --  Net 1635.36 ml   Filed Weights   06/20/23 1049  Weight: 103 kg    Exam:  General:  Appears calm and comfortable and is in NAD Eyes:  EOMI, normal lids, iris ENT:  grossly normal hearing, lips & tongue, mmm Neck:  no LAD, masses or thyromegaly Cardiovascular:  RRR, no m/r/g. No LE edema.  Respiratory:   CTA bilaterally with no wheezes/rales/rhonchi.  Normal respiratory effort. Abdomen:  soft, mildly diffusely tender, ND, NABS Skin:  no rash or induration seen on limited exam Musculoskeletal:  grossly normal tone BUE/BLE, good ROM, no bony abnormality Psychiatric:  grossly normal mood and affect, speech fluent and appropriate, AOx3 Neurologic:  CN 2-12 grossly intact, moves all extremities in coordinated fashion  Data Reviewed: I have reviewed the patient's lab results since admission.  Pertinent labs for today include:  Na++ 133 K+ 3.4 WBC 10.1 Hgb 10.7 Platelets 442 L hilar mass FNA pathology c/w poorly differentiated malignancy    Family Communication: None present  Disposition: Status is: Inpatient Remains inpatient appropriate because: ongoing evaluation and treatment  Planned Discharge Destination: Home    Time spent: 50 minutes  Author: Jonah Blue, MD 06/23/2023  2:25 PM  For on call review [email or web address].

## 2023-06-23 NOTE — Progress Notes (Signed)
Mobility Specialist Progress Note:    06/23/23 1342  Mobility  Activity Ambulated with assistance in hallway;Ambulated with assistance in room;Ambulated with assistance to bathroom  Level of Assistance Contact guard assist, steadying assist  Assistive Device None  Distance Ambulated (ft) 45 ft  Range of Motion/Exercises Active;All extremities  Activity Response Tolerated well  Mobility Referral Yes  $Mobility charge 1 Mobility  Mobility Specialist Start Time (ACUTE ONLY) 1300  Mobility Specialist Stop Time (ACUTE ONLY) 1315  Mobility Specialist Time Calculation (min) (ACUTE ONLY) 15 min   Pt received ambulating independently to bathroom. Agreeable to mobility session (ambulated in hallway). Tolerated well, audible SOB throughout. Returned pt to room, assisted back into  bed, MinA required for LLE. Left with all need met and call bell in reach.  Feliciana Rossetti Mobility Specialist Please contact via Special educational needs teacher or  Rehab office at [phone]

## 2023-06-23 NOTE — Plan of Care (Signed)
  Problem: Education: Goal: Knowledge of General Education information will improve Description: Including pain rating scale, medication(s)/side effects and non-pharmacologic comfort measures Outcome: Progressing   Problem: Health Behavior/Discharge Planning: Goal: Ability to manage health-related needs will improve Outcome: Not Met (add Reason)   Problem: Clinical Measurements: Goal: Ability to maintain clinical measurements within normal limits will improve Outcome: Progressing   Problem: Activity: Goal: Risk for activity intolerance will decrease Outcome: Not Met (add Reason)   Problem: Nutrition: Goal: Adequate nutrition will be maintained Outcome: Not Met (add Reason)   Problem: Coping: Goal: Level of anxiety will decrease Outcome: Not Met (add Reason)   Problem: Pain Managment: Goal: General experience of comfort will improve Outcome: Not Met (add Reason)

## 2023-06-24 ENCOUNTER — Encounter (HOSPITAL_COMMUNITY)
Admission: EM | Disposition: A | Discharge status: Home / Self Care | Payer: Self-pay | Source: Home / Self Care | Attending: Internal Medicine

## 2023-06-24 ENCOUNTER — Encounter (HOSPITAL_COMMUNITY): Payer: Self-pay | Admitting: Internal Medicine

## 2023-06-24 ENCOUNTER — Inpatient Hospital Stay (HOSPITAL_COMMUNITY): Admitting: Certified Registered"

## 2023-06-24 ENCOUNTER — Inpatient Hospital Stay (HOSPITAL_COMMUNITY)

## 2023-06-24 ENCOUNTER — Other Ambulatory Visit: Payer: Self-pay

## 2023-06-24 DIAGNOSIS — Z6836 Body mass index (BMI) 36.0-36.9, adult: Secondary | ICD-10-CM

## 2023-06-24 DIAGNOSIS — C3490 Malignant neoplasm of unspecified part of unspecified bronchus or lung: Secondary | ICD-10-CM

## 2023-06-24 DIAGNOSIS — E6609 Other obesity due to excess calories: Secondary | ICD-10-CM

## 2023-06-24 DIAGNOSIS — J189 Pneumonia, unspecified organism: Secondary | ICD-10-CM

## 2023-06-24 DIAGNOSIS — R918 Other nonspecific abnormal finding of lung field: Secondary | ICD-10-CM

## 2023-06-24 DIAGNOSIS — A0472 Enterocolitis due to Clostridium difficile, not specified as recurrent: Secondary | ICD-10-CM | POA: Diagnosis not present

## 2023-06-24 DIAGNOSIS — E876 Hypokalemia: Secondary | ICD-10-CM | POA: Diagnosis not present

## 2023-06-24 DIAGNOSIS — C3492 Malignant neoplasm of unspecified part of left bronchus or lung: Secondary | ICD-10-CM | POA: Diagnosis not present

## 2023-06-24 HISTORY — PX: PORTACATH PLACEMENT: SHX2246

## 2023-06-24 HISTORY — PX: AXILLARY LYMPH NODE BIOPSY: SHX5737

## 2023-06-24 SURGERY — INSERTION, TUNNELED CENTRAL VENOUS DEVICE, WITH PORT
Anesthesia: General | Site: Chest | Laterality: Right

## 2023-06-24 MED ORDER — PROPOFOL 10 MG/ML IV BOLUS
INTRAVENOUS | Status: DC | PRN
  Administered 2023-06-24: 200 mg via INTRAVENOUS

## 2023-06-24 MED ORDER — ORAL CARE MOUTH RINSE: 15.0000 mL | Freq: Once | OROMUCOSAL | Status: DC

## 2023-06-24 MED ORDER — LIDOCAINE 2% (20 MG/ML) 5 ML SYRINGE
INTRAMUSCULAR | Status: DC | PRN
  Administered 2023-06-24: 100 mg via INTRAVENOUS

## 2023-06-24 MED ORDER — ONDANSETRON HCL 4 MG/2ML IJ SOLN
INTRAMUSCULAR | Status: DC | PRN
  Administered 2023-06-24: 4 mg via INTRAVENOUS

## 2023-06-24 MED ORDER — LIDOCAINE HCL (PF) 2 % IJ SOLN
INTRAMUSCULAR | Status: AC
  Filled 2023-06-24: qty 5

## 2023-06-24 MED ORDER — SODIUM CHLORIDE (PF) 0.9 % IJ SOLN
INTRAMUSCULAR | Status: DC | PRN
  Administered 2023-06-24: 500 mL via INTRAVENOUS

## 2023-06-24 MED ORDER — LIDOCAINE HCL (PF) 1 % IJ SOLN
INTRAMUSCULAR | Status: AC
  Filled 2023-06-24: qty 30

## 2023-06-24 MED ORDER — MIDAZOLAM HCL 2 MG/2ML IJ SOLN
INTRAMUSCULAR | Status: DC | PRN
  Administered 2023-06-24: 2 mg via INTRAVENOUS

## 2023-06-24 MED ORDER — ONDANSETRON HCL 4 MG/2ML IJ SOLN
INTRAMUSCULAR | Status: AC
  Filled 2023-06-24: qty 2

## 2023-06-24 MED ORDER — DEXMEDETOMIDINE HCL IN NACL 80 MCG/20ML IV SOLN
INTRAVENOUS | Status: AC
  Filled 2023-06-24: qty 20

## 2023-06-24 MED ORDER — BUPIVACAINE HCL (PF) 0.5 % IJ SOLN
INTRAMUSCULAR | Status: AC
  Filled 2023-06-24: qty 30

## 2023-06-24 MED ORDER — ROCURONIUM BROMIDE 10 MG/ML (PF) SYRINGE
PREFILLED_SYRINGE | INTRAVENOUS | Status: AC
  Filled 2023-06-24: qty 10

## 2023-06-24 MED ORDER — SUGAMMADEX SODIUM 200 MG/2ML IV SOLN
INTRAVENOUS | Status: DC | PRN
  Administered 2023-06-24: 200 mg via INTRAVENOUS

## 2023-06-24 MED ORDER — VANCOMYCIN HCL 1500 MG/300ML IV SOLN
1500.0000 mg | INTRAVENOUS | Status: AC
  Administered 2023-06-24: 1500 mg via INTRAVENOUS
  Filled 2023-06-24: qty 300

## 2023-06-24 MED ORDER — MIDAZOLAM HCL 2 MG/2ML IJ SOLN
INTRAMUSCULAR | Status: AC
  Filled 2023-06-24: qty 2

## 2023-06-24 MED ORDER — SUCCINYLCHOLINE CHLORIDE 200 MG/10ML IV SOSY
PREFILLED_SYRINGE | INTRAVENOUS | Status: DC | PRN
  Administered 2023-06-24: 100 mg via INTRAVENOUS

## 2023-06-24 MED ORDER — PROPOFOL 10 MG/ML IV BOLUS
INTRAVENOUS | Status: AC
  Filled 2023-06-24: qty 20

## 2023-06-24 MED ORDER — HEPARIN SOD (PORK) LOCK FLUSH 100 UNIT/ML IV SOLN
INTRAVENOUS | Status: AC
  Filled 2023-06-24: qty 5

## 2023-06-24 MED ORDER — BUPIVACAINE HCL (PF) 0.5 % IJ SOLN
INTRAMUSCULAR | Status: DC | PRN
  Administered 2023-06-24: 10 mL

## 2023-06-24 MED ORDER — FENTANYL CITRATE (PF) 250 MCG/5ML IJ SOLN
INTRAMUSCULAR | Status: DC | PRN
  Administered 2023-06-24 (×2): 50 ug via INTRAVENOUS

## 2023-06-24 MED ORDER — ROCURONIUM BROMIDE 10 MG/ML (PF) SYRINGE
PREFILLED_SYRINGE | INTRAVENOUS | Status: DC | PRN
  Administered 2023-06-24: 40 mg via INTRAVENOUS

## 2023-06-24 MED ORDER — CHLORHEXIDINE GLUCONATE CLOTH 2 % EX PADS: 6.0000 | MEDICATED_PAD | Freq: Once | CUTANEOUS | Status: DC

## 2023-06-24 MED ORDER — HEPARIN SOD (PORK) LOCK FLUSH 100 UNIT/ML IV SOLN
INTRAVENOUS | Status: DC | PRN
  Administered 2023-06-24: 500 [IU] via INTRAVENOUS

## 2023-06-24 MED ORDER — LACTATED RINGERS IV SOLN: INTRAVENOUS | Status: DC

## 2023-06-24 MED ORDER — DEXMEDETOMIDINE HCL IN NACL 80 MCG/20ML IV SOLN
INTRAVENOUS | Status: DC | PRN
  Administered 2023-06-24: 8 ug via INTRAVENOUS

## 2023-06-24 MED ORDER — FENTANYL CITRATE (PF) 100 MCG/2ML IJ SOLN
INTRAMUSCULAR | Status: AC
  Filled 2023-06-24: qty 2

## 2023-06-24 MED ORDER — CHLORHEXIDINE GLUCONATE 0.12 % MT SOLN: 15.0000 mL | Freq: Once | OROMUCOSAL | Status: DC

## 2023-06-24 MED ORDER — ENSURE ENLIVE PO LIQD: 237.0000 mL | Freq: Three times a day (TID) | ORAL | Status: DC

## 2023-06-24 MED ORDER — 0.9 % SODIUM CHLORIDE (POUR BTL) OPTIME
TOPICAL | Status: DC | PRN
  Administered 2023-06-24: 1000 mL

## 2023-06-24 SURGICAL SUPPLY — 39 items
ADH SKN CLS APL DERMABOND .7 (GAUZE/BANDAGES/DRESSINGS) ×4
APL PRP STRL LF DISP 70% ISPRP (MISCELLANEOUS) ×2
APL PRP STRL LF ISPRP CHG 10.5 (MISCELLANEOUS) ×2
APPLICATOR CHLORAPREP 10.5 ORG (MISCELLANEOUS) ×2 IMPLANT
BAG DECANTER FOR FLEXI CONT (MISCELLANEOUS) ×2 IMPLANT
BLADE SURG 15 STRL LF DISP TIS (BLADE) ×2 IMPLANT
BLADE SURG 15 STRL SS (BLADE) ×2
CHLORAPREP W/TINT 26 (MISCELLANEOUS) ×2 IMPLANT
CLOTH BEACON ORANGE TIMEOUT ST (SAFETY) ×2 IMPLANT
CNTNR URN SCR LID CUP LEK RST (MISCELLANEOUS) ×2 IMPLANT
CONT SPEC 4OZ STRL OR WHT (MISCELLANEOUS) ×2
COVER LIGHT HANDLE STERIS (MISCELLANEOUS) ×4 IMPLANT
DERMABOND ADVANCED .7 DNX12 (GAUZE/BANDAGES/DRESSINGS) ×2 IMPLANT
DRAPE C-ARM FOLDED MOBILE STRL (DRAPES) ×2 IMPLANT
DRAPE UTILITY W/TAPE 26X15 (DRAPES) IMPLANT
ELECT REM PT RETURN 9FT ADLT (ELECTROSURGICAL) ×2
ELECTRODE REM PT RTRN 9FT ADLT (ELECTROSURGICAL) ×2 IMPLANT
GLOVE BIOGEL PI IND STRL 7.0 (GLOVE) ×4 IMPLANT
GLOVE SURG SS PI 7.5 STRL IVOR (GLOVE) ×4 IMPLANT
GOWN STRL REUS W/TWL LRG LVL3 (GOWN DISPOSABLE) ×4 IMPLANT
IV NS 500ML (IV SOLUTION) ×2
IV NS 500ML BAXH (IV SOLUTION) ×2 IMPLANT
KIT PORT POWER 8FR ISP MRI (Port) ×2 IMPLANT
KIT TURNOVER KIT A (KITS) ×2 IMPLANT
MANIFOLD NEPTUNE II (INSTRUMENTS) ×2 IMPLANT
NDL HYPO 21X1.5 SAFETY (NEEDLE) ×2 IMPLANT
NEEDLE HYPO 21X1.5 SAFETY (NEEDLE) ×2 IMPLANT
PACK MINOR (CUSTOM PROCEDURE TRAY) ×2 IMPLANT
PAD ARMBOARD 7.5X6 YLW CONV (MISCELLANEOUS) ×2 IMPLANT
PENCIL SMOKE EVACUATOR (MISCELLANEOUS) IMPLANT
POSITIONER HEAD 8X9X4 ADT (SOFTGOODS) ×2 IMPLANT
SET BASIN LINEN APH (SET/KITS/TRAYS/PACK) ×2 IMPLANT
SPONGE T-LAP 18X18 ~~LOC~~+RFID (SPONGE) ×2 IMPLANT
SUT MNCRL AB 4-0 PS2 18 (SUTURE) ×2 IMPLANT
SUT VIC AB 3-0 SH 27 (SUTURE) ×2
SUT VIC AB 3-0 SH 27X BRD (SUTURE) ×2 IMPLANT
SYR 30ML LL (SYRINGE) ×4 IMPLANT
SYR 5ML LL (SYRINGE) ×2 IMPLANT
SYR CONTROL 10ML LL (SYRINGE) ×2 IMPLANT

## 2023-06-24 NOTE — Anesthesia Preprocedure Evaluation (Signed)
Anesthesia Evaluation  Patient identified by MRN, date of birth, ID band Patient awake    Reviewed: Allergy & Precautions, H&P , NPO status , Patient's Chart, lab work & pertinent test results, reviewed documented beta blocker date and time   Airway Mallampati: II  TM Distance: >3 FB Neck ROM: full    Dental no notable dental hx.    Pulmonary neg pulmonary ROS, shortness of breath, pneumonia, former smoker   Pulmonary exam normal breath sounds clear to auscultation       Cardiovascular Exercise Tolerance: Good + DOE  negative cardio ROS  Rhythm:regular Rate:Normal     Neuro/Psych CVA negative neurological ROS  negative psych ROS   GI/Hepatic negative GI ROS, Neg liver ROS,,,  Endo/Other    Morbid obesity  Renal/GU negative Renal ROS  negative genitourinary   Musculoskeletal   Abdominal   Peds  Hematology negative hematology ROS (+) Blood dyscrasia, anemia   Anesthesia Other Findings   Reproductive/Obstetrics negative OB ROS                             Anesthesia Physical Anesthesia Plan  ASA: 3  Anesthesia Plan: General and General LMA   Post-op Pain Management:    Induction:   PONV Risk Score and Plan: Ondansetron  Airway Management Planned:   Additional Equipment:   Intra-op Plan:   Post-operative Plan:   Informed Consent: I have reviewed the patients History and Physical, chart, labs and discussed the procedure including the risks, benefits and alternatives for the proposed anesthesia with the patient or authorized representative who has indicated his/her understanding and acceptance.     Dental Advisory Given  Plan Discussed with: CRNA  Anesthesia Plan Comments:        Anesthesia Quick Evaluation

## 2023-06-24 NOTE — Anesthesia Procedure Notes (Signed)
Procedure Name: Intubation Date/Time: 06/24/2023 2:17 PM  Performed by: Julian Reil, CRNAPre-anesthesia Checklist: Patient identified, Emergency Drugs available, Suction available and Patient being monitored Patient Re-evaluated:Patient Re-evaluated prior to induction Oxygen Delivery Method: Circle system utilized Preoxygenation: Pre-oxygenation with 100% oxygen Induction Type: IV induction, Rapid sequence and Cricoid Pressure applied Laryngoscope Size: Glidescope and 3 Grade View: Grade I Tube type: Oral Tube size: 7.0 mm Number of attempts: 1 Airway Equipment and Method: Stylet and Video-laryngoscopy Placement Confirmation: ETT inserted through vocal cords under direct vision, positive ETCO2 and breath sounds checked- equal and bilateral Secured at: 23 cm Tube secured with: Tape Dental Injury: Teeth and Oropharynx as per pre-operative assessment  Comments: C/O nausea today, emesis bag noted on stretcher on patient's lap.  Plan RSI, Glidescope as noted above.

## 2023-06-24 NOTE — Transfer of Care (Signed)
Immediate Anesthesia Transfer of Care Note  Patient: Miranda Perkins  Procedure(s) Performed: INSERTION PORT-A-CATH (Chest) AXILLARY LYMPH NODE BIOPSY (Right: Arm Upper)  Patient Location: PACU  Anesthesia Type:General  Level of Consciousness: awake and patient cooperative  Airway & Oxygen Therapy: Patient Spontanous Breathing  Post-op Assessment: Report given to RN and Post -op Vital signs reviewed and stable  Post vital signs: Reviewed and stable  Last Vitals:  Vitals Value Taken Time  BP 137/96 06/24/23 1514  Temp 37.2 C 06/24/23 1514  Pulse 129 06/24/23 1514  Resp 23 06/24/23 1514  SpO2 94 % 06/24/23 1514    Last Pain:  Vitals:   06/24/23 1244  TempSrc: Oral  PainSc: 0-No pain      Patients Stated Pain Goal: 8 (06/24/23 1244)  Complications: No notable events documented.

## 2023-06-24 NOTE — Interval H&P Note (Signed)
History and Physical Interval Note:  06/24/2023 1:07 PM  Miranda Perkins  has presented today for surgery, with the diagnosis of lung cancer.  The various methods of treatment have been discussed with the patient and family. After consideration of risks, benefits and other options for treatment, the patient has consented to  Procedure(s): INSERTION PORT-A-CATH (N/A) AXILLARY LYMPH NODE BIOPSY (Right) as a surgical intervention.  The patient's history has been reviewed, patient examined, no change in status, stable for surgery.  I have reviewed the patient's chart and labs.  Questions were answered to the patient's satisfaction.     Franky Macho

## 2023-06-24 NOTE — Op Note (Signed)
Patient:  Miranda Perkins  DOB:  [birth year]  MRN:  [id number]   Preop Diagnosis: Metastatic lung cancer  Postop Diagnosis: Same  Procedure: Port-A-Cath insertion, biopsy of right axillary skin nodule  Surgeon: Franky Macho, MD  Anes: General Endotracheal  Indications: Patient is a 62 year old white female with a history of metastatic lung cancer who was referred to my care for both a Port-A-Cath insertion and biopsy of the skin nodule in the right arm close to the axilla.  The risks and benefits of both procedures including bleeding, infection, and pneumothorax were fully explained to the patient, who gave informed consent.  Procedure note: The patient was placed in the supine position.  After induction of general endotracheal anesthesia, the left upper chest as well as right axillary region were prepped and draped using the usual sterile technique with ChloraPrep.  Surgical site confirmation was performed.  An incision was made below the left clavicle.  A subcutaneous pocket was formed.  The needle was advanced into the left subclavian vein using the Seldinger technique without difficulty.  A guidewire was then advanced into the right atrium under fluoroscopic guidance.  An introducer and peel-away sheath were placed over the guidewire.  The catheter was inserted through the peel-away sheath and the peel-away sheath was removed.  The catheter was then attached to the port and the port placed in subcutaneous pocket.  Adequate positioning was confirmed by fluoroscopy.  Good backflow of venous blood was noted on aspiration of the port.  The port was flushed with heparin flush.  The subcutaneous layer was reapproximated using a 3-0 Vicryl interrupted suture.  0.5% Sensorcaine was instilled into the surrounding skin.  The skin was closed using a 4-0 Monocryl subcuticular suture.  Dermabond was applied.  Next, a subcutaneous nodule in the right upper arm close to the axilla was excised.  This was  not cystic in nature.  The grossly appeared to be a metastatic process.  It was sent to pathology further examination.  A bleeding was controlled using Bovie electrocautery.  0.5% Sensorcaine was instilled into the surrounding wound.  The skin was closed using a 4-0 Monocryl subcuticular suture.  Dermabond was applied.  All tape and needle counts were correct at the end of the procedure.  The patient was extubated in the operating room and transferred to PACU in stable condition.  A chest x-ray will be performed at that time.    Complications: None  EBL: Minimal  Specimen: Right axillary subcutaneous mass

## 2023-06-24 NOTE — Progress Notes (Signed)
Nutrition Follow-up  DOCUMENTATION CODES:   Obesity unspecified  INTERVENTION:   -Continue MVI with minerals daily -D/c Boost Breeze -D/c Prosource Plus -Ensure Enlive po TID, each supplement provides 350 kcal and 20 grams of protein -Liberalize diet to regular for widest variety of meal selections  NUTRITION DIAGNOSIS:   Inadequate oral intake related to inability to eat as evidenced by NPO status.  Progressing; advanced to heart healthy diet  GOAL:   Patient will meet greater than or equal to 90% of their needs  Progressing   MONITOR:   PO intake, Supplement acceptance, Diet advancement  REASON FOR ASSESSMENT:   Malnutrition Screening Tool    ASSESSMENT:   Pt with medical history significant of adrenal hemorrhage, postmenopausal vaginal bleeding, history of lung biopsy, history of C. difficile, and more presents with a chief complaint of abdominal pain and back pain.  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 Plan for radiation therapy and palliative immunotherapy at discharge.   Attempted to examine pt multiple times, however, pt out of room for the majority of the afternoon secondary to procedure. RD unable to obtain further nutrition-related history or complete nutrition-focused physical exam at this time.    Noted pt has been advanced to a heart healthy diet. No meal completion data available to assess at this time.   Reviewed wt hx; pt has experinced a 8% wt loss over the past 2 months, which is significant for time frame. Pt is at high risk for malnutrition, however, unable to identify at this time. Pt also with generalized edema per nursing assessment, which may be masking true weight loss as well as fat and muscle  depletions.   Medications reviewed and include florastor and phenergan.   Obesity is a complex, chronic medical condition that is optimally managed by a multidisciplinary care team. Weight loss is not an ideal goal for an acute inpatient hospitalization. However, if further work-up for obesity is warranted, consider outpatient referral to [facility] Nutrition and Diabetes Education Services.   Labs reviewed: Na: 133, K: 3.4.    Diet Order:   Diet Order     None       EDUCATION NEEDS:   No education needs have been identified at this time  Skin:  Skin Assessment: Skin Integrity Issues: Skin Integrity Issues:: Incisions Incisions: closed lt chest, closed rt arm  Last BM:  06/23/23 (type 7)  Height:   Ht Readings from Last 1 Encounters:  06/20/23 5\' 6"  (1.676 m)    Weight:   Wt Readings from Last 1 Encounters:  06/20/23 103 kg    Ideal Body Weight:  59.1 kg  BMI:  Body mass index is 36.65 kg/m.  Estimated Nutritional Needs:   Kcal:  1750-1950  Protein:  90-105 grams  Fluid:  > 1.7 L    Levada Schilling, RD, LDN, CDCES Registered Dietitian II Certified Diabetes Care and Education Specialist Please refer to [facility] for RD and/or RD on-call/weekend/after hours pager

## 2023-06-24 NOTE — Progress Notes (Signed)
Progress Note   Patient: Miranda Perkins UJW:[id number] DOB: Apr 18, 1961 DOA: 06/20/2023     3 DOS: the patient was seen and examined on 06/24/2023   Brief hospital course: No notes on file  Assessment and Plan: * recent history of C. difficile colitis Patient with recent history of C difficile colitis It was treated with 10 days of Dificid but symptoms never completely resolved - still having periodic diarrhea as well as recent intractable n/v Imaging with concern for ileus Suspect that this is persistent (not recurrent) C diff infection Leukocytosis/elevated procalcitonin likely related to this issue Discussed with Dr. Ninetta Lights from ID - further testing is not needed, will repeat course of treatment Patient reports nausea from Dificid and would like an alternate treatment option, so will treat with PO Vancomycin x 10 days Repeat AXR unremarkable  Hypokalemia Potassium 3.1 in the ED, repleted  Monitor BMP and replace as needed.  Malignant neoplasm of unspecified part of unspecified bronchus or lung [facility]) Recently diagnosed in July with bronchoscopy with biopsy performed on Monday, 8/12 by Dr. 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 Intensely hypermetabolic solid 5.7 cm posterior left perihilar lung mass, compatible with primary bronchogenic malignancy. 2. 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 3. Mildly hypermetabolic aortocaval retroperitoneal adenopathy, suspicious for nodal metastasis. 4. Intensely hypermetabolic heterogeneous hypodense 1.7 cm right thyroid nodule, suspicious for thyroid malignancy. 5)  Hypermetabolic superficial subcutaneous 0.7 cm soft tissue lesion in the medial proximal right upper extremity, nonspecific, cannot exclude soft tissue metastasis. Thyroid US 7/9 with no evidence of malignancy and stable node size  Oncology to see s/p biopsy - discussed with Dr.  Ellin Saba.  Oncology consulted and following. Continue oral Dilaudid as needed for pain.  Fentanyl patch was prescribed per recommendation from palliative care team but patient does not want to use this medication. She reports cough productive of persistent thick sputum, likely related to underlying malignancy.  She reports significant improvement with addition of Mucinex. Plan for right axillary lymph node biopsy today.  Class 2 obesity due to excess calories with body mass index (BMI) of 36.0 to 36.9 in adult Nutrition Status: Nutrition Problem: Inadequate oral intake Etiology: inability to eat Signs/Symptoms: NPO status Interventions: Refer to RD note for recommendations  Obesity Body mass index is 36.65 kg/m.[name]  Weight loss should be encouraged Outpatient PCP/bariatric medicine f/u encouraged   Ileus [facility]) CT A/P with possible ileus Surgery consulting, non-surgical at this time   Body mass index is 36.65 kg/m.  Nutrition Documentation    Flowsheet Row ED to Hosp-Admission (Current) from 06/20/2023 in [facility] SURGICAL UNIT  Nutrition Problem Inadequate oral intake  Etiology inability to eat  Nutrition Goal Patient will meet greater than or equal to 90% of their needs  Interventions Boost Breeze, MVI      Consultants: Surgery Oncology   Procedures: None   Antibiotics: Oral vancomycin, 8/19-28   30 Day Unplanned Readmission Risk Score    Flowsheet Row ED to Hosp-Admission (Current) from 06/20/2023 in [facility] MEDICAL SURGICAL UNIT  30 Day Unplanned Readmission Risk Score (%) 17.64 Filed at 06/23/2023 0400       This score is the patient's risk of an unplanned readmission within 30 days of being discharged (0 -100%). The score is based on dignosis, age, lab data, medications, orders, and past utilization.   Low:  0-14.9   Medium: 15-21.9   High: 22-29.9   Extreme: 30  and above           Subjective: Patient seen and examined this morning.  She  says her cough is better.  She complained of some nausea but denies having any vomiting.  She has been having diarrhea states that it is slowly improving.  Denies having any worsening abdominal pain. She is scheduled for right axillary lymph node biopsy today.  Cough is better with mucinex.    Objective: Vitals:   06/24/23 0118 06/24/23 0450  BP:  123/71  Pulse:  92  Resp:  20  Temp: 98 F (36.7 C) 98 F (36.7 C)  SpO2:  99%    Intake/Output Summary (Last 24 hours) at 06/24/2023 0910 Last data filed at 06/23/2023 1700 Gross per 24 hour  Intake 1000 ml  Output --  Net 1000 ml   Filed Weights   06/20/23 1049  Weight: 103 kg    Exam:  General:  Appears calm and comfortable and is in NAD Eyes:  EOMI, normal lids, iris ENT:  grossly normal hearing, lips & tongue, mmm Neck:  no LAD, masses or thyromegaly Cardiovascular:  RRR, no m/r/g. No LE edema.  Respiratory:   CTA bilaterally with no wheezes/rales/rhonchi.  Normal respiratory effort. Abdomen:  soft, mildly diffusely tender without any guarding or rebound, ND, NABS Skin:  no rash or induration seen on limited exam Musculoskeletal:  grossly normal tone BUE/BLE, good ROM, no bony abnormality Psychiatric:  grossly normal mood and affect, speech fluent and appropriate, AOx3 Neurologic:  CN 2-12 grossly intact, moves all extremities in coordinated fashion  Data Reviewed: I have reviewed the patient's lab results since admission.  Pertinent labs for today include:  Na++ 133 K+ 3.4 WBC 10.1 Hgb 10.7 Platelets 442 L hilar mass FNA pathology c/w poorly differentiated malignancy    Family Communication: None present  Disposition: Status is: Inpatient Remains inpatient appropriate because: ongoing evaluation and treatment  Planned Discharge Destination: Home DVT prophylaxis: Enoxaparin (lovenox) injection 40 mg   Time spent: 50 minutes  Author: Vonzella Nipple, MD 06/24/2023 9:10 AM  For on call review  [email or web address].

## 2023-06-24 NOTE — H&P (View-Only) (Signed)
Have been asked by Dr. Ellin Saba of oncology for Port-A-Cath placement and biopsy of a right axillary nodule.  Will proceed with Port-A-Cath insertion, biopsy of right axillary nodule today.  The risks and benefits of the procedure including bleeding, infection, and pneumothorax were fully explained to the patient, who gave informed consent.

## 2023-06-24 NOTE — Progress Notes (Signed)
Have been asked by Dr. Ellin Saba of oncology for Port-A-Cath placement and biopsy of a right axillary nodule.  Will proceed with Port-A-Cath insertion, biopsy of right axillary nodule today.  The risks and benefits of the procedure including bleeding, infection, and pneumothorax were fully explained to the patient, who gave informed consent.

## 2023-06-25 ENCOUNTER — Inpatient Hospital Stay

## 2023-06-25 DIAGNOSIS — A0472 Enterocolitis due to Clostridium difficile, not specified as recurrent: Secondary | ICD-10-CM | POA: Diagnosis not present

## 2023-06-25 LAB — COMPREHENSIVE METABOLIC PANEL
ALT: 63 U/L — ABNORMAL HIGH (ref 0–44)
AST: 50 U/L — ABNORMAL HIGH (ref 15–41)
Albumin: 2.1 g/dL — ABNORMAL LOW (ref 3.5–5.0)
Alkaline Phosphatase: 120 U/L (ref 38–126)
Anion gap: 9 (ref 5–15)
BUN: 7 mg/dL — ABNORMAL LOW (ref 8–23)
CO2: 23 mmol/L (ref 22–32)
Calcium: 7.9 mg/dL — ABNORMAL LOW (ref 8.9–10.3)
Chloride: 100 mmol/L (ref 98–111)
Creatinine, Ser: 0.7 mg/dL (ref 0.44–1.00)
GFR, Estimated: >60 mL/min (ref >60)
Glucose, Bld: 101 mg/dL — ABNORMAL HIGH (ref 70–99)
Potassium: 3.1 mmol/L — ABNORMAL LOW (ref 3.5–5.1)
Sodium: 132 mmol/L — ABNORMAL LOW (ref 135–145)
Total Bilirubin: 1 mg/dL (ref 0.3–1.2)
Total Protein: 5.6 g/dL — ABNORMAL LOW (ref 6.5–8.1)

## 2023-06-25 LAB — CBC
HCT: 32.5 % — ABNORMAL LOW (ref 36.0–46.0)
Hemoglobin: 10.2 g/dL — ABNORMAL LOW (ref 12.0–15.0)
MCH: 26.2 pg (ref 26.0–34.0)
MCHC: 31.4 g/dL (ref 30.0–36.0)
MCV: 83.3 fL (ref 80.0–100.0)
Platelets: 437 10*3/uL — ABNORMAL HIGH (ref 150–400)
RBC: 3.9 MIL/uL (ref 3.87–5.11)
RDW: 15.2 % (ref 11.5–15.5)
WBC: 13.4 10*3/uL — ABNORMAL HIGH (ref 4.0–10.5)
nRBC: 0 % (ref 0.0–0.2)

## 2023-06-25 MED ORDER — ONDANSETRON HCL 4 MG PO TABS
4.0000 mg | ORAL_TABLET | Freq: Three times a day (TID) | ORAL | Status: DC
  Administered 2023-06-25 – 2023-06-26 (×5): 4 mg via ORAL
  Filled 2023-06-25 (×5): qty 1

## 2023-06-25 MED ORDER — GUAIFENESIN-DM 100-10 MG/5ML PO SYRP
5.0000 mL | ORAL_SOLUTION | ORAL | Status: DC | PRN
  Administered 2023-06-25 – 2023-06-26 (×2): 5 mL via ORAL
  Filled 2023-06-25 (×2): qty 5

## 2023-06-25 NOTE — Plan of Care (Signed)

## 2023-06-25 NOTE — Progress Notes (Signed)
1 Day Post-Op  Subjective: Patient denies any incisional pain.  Objective: Vital signs in last 24 hours: Temp:  [97.9 F (36.6 C)-99 F (37.2 C)] 98.7 F (37.1 C) (08/23 0347) Pulse Rate:  [91-129] 92 (08/23 0435) Resp:  [18-23] 20 (08/23 0347) BP: (106-137)/(53-96) 131/74 (08/23 0347) SpO2:  [94 %-100 %] 96 % (08/23 0347) Last BM Date : 06/22/23  Intake/Output from previous day: 08/22 0701 - 08/23 0700 In: 1140 [P.O.:240; I.V.:600; IV Piggyback:300] Out: 5 [Blood:5] Intake/Output this shift: No intake/output data recorded.  General appearance: alert, cooperative, and no distress Chest wall: Port-A-Cath incision site healing well Incision/Wound: Right axillary incision healing well.  Lab Results:  Recent Labs    06/25/23 0426  WBC 13.4*  HGB 10.2*  HCT 32.5*  PLT 437*   BMET Recent Labs    06/25/23 0426  NA 132*  K 3.1*  CL 100  CO2 23  GLUCOSE 101*  BUN 7*  CREATININE 0.70  CALCIUM 7.9*   PT/INR No results for input(s): "LABPROT", "INR" in the last 72 hours.  Studies/Results: DG Chest Port 1 View  Result Date: 06/24/2023 CLINICAL DATA:  Port-A-Cath placement EXAM: PORTABLE CHEST 1 VIEW COMPARISON:  06/14/2023 FINDINGS: Left Port-A-Cath tip at low SVC 2 superior caval/atrial junction. Right hemidiaphragm elevation.  Cervical spine fixation. Patient rotated left. Midline trachea. Normal heart size. No pleural effusion or pneumothorax. Left perihilar mass measures 6.1 cm today versus 5.5 cm on 06/14/2023. IMPRESSION: Left Port-A-Cath terminates at the low SVC or superior caval/atrial junction, without pneumothorax. Left perihilar mass measures slightly larger today than on 06/04/2023. Difference is favored to be due to mild obliquity. Electronically Signed   By: Jeronimo Greaves M.D.   On: 06/24/2023 19:10   DG C-Arm 1-60 Min-No Report  Result Date: 06/24/2023 Fluoroscopy was utilized by the requesting physician.  No radiographic interpretation.     Anti-infectives: Anti-infectives (From admission, onward)    Start     Dose/Rate Route Frequency Ordered Stop   06/24/23 1230  vancomycin (VANCOREADY) IVPB 1500 mg/300 mL        1,500 mg 150 mL/hr over 120 Minutes Intravenous On call to O.R. 06/24/23 1155 06/24/23 1620   06/21/23 1400  vancomycin (VANCOCIN) capsule 125 mg        125 mg Oral 4 times daily 06/21/23 1227 07/01/23 1359   06/21/23 1245  fidaxomicin (DIFICID) tablet 200 mg  Status:  Discontinued        200 mg Oral 2 times daily 06/21/23 1146 06/21/23 1227       Assessment/Plan: s/p Procedure(s): INSERTION PORT-A-CATH AXILLARY LYMPH NODE BIOPSY Impression: Stable on postoperative day 1.  May use Port-A-Cath.  Awaiting pathology results.  LOS: 4 days    Franky Macho 06/25/2023

## 2023-06-25 NOTE — Progress Notes (Signed)
HR elevated , rechecked and HR 110 changed back to green MEWS   06/25/23 1424  Assess: MEWS Score  Temp 98.9 F (37.2 C)  Pulse Rate (!) 110  SpO2 95 %  Assess: MEWS Score  MEWS Temp 0  MEWS Systolic 0  MEWS Pulse 1  MEWS RR 0  MEWS LOC 0  MEWS Score 1  MEWS Score Color Green  Assess: if the MEWS score is Yellow or Red  Were vital signs accurate and taken at a resting state? Yes  Does the patient meet 2 or more of the SIRS criteria? No  Notify: Charge Nurse/RN  Name of Charge Nurse/RN Notified Eliseo Gum, RN  Provider Notification  Provider Name/Title [facility]  Date Provider Notified 06/25/23  Time Provider Notified 1527  Assess: SIRS CRITERIA  SIRS Temperature  0  SIRS Pulse 1  SIRS Respirations  0  SIRS WBC 0  SIRS Score Sum  1

## 2023-06-25 NOTE — Progress Notes (Signed)
Progress Note   Patient: Miranda Perkins ZOX:[id number] DOB: 20-Feb-1961 DOA: 06/20/2023     4 DOS: the patient was seen and examined on 06/25/2023   Brief hospital course: 62yo female with h/o adrenal hemorrhage, postmenopausal vaginal bleeding, and C. Difficile infection 1 month ago who presented on 8/19 with abdominal pain.  CT with possible ileus.  Persistent n/v that has been intractable.  No longer with n/v, tolerating full liquid diet.  Worsening diarrhea on 8/21.  Treating for persistent C. Diff with PO Vanc.  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  Assessment and Plan: 1)Recent history of C. difficile Colitis Patient with recent history of C difficile colitis It was treated with 10 days of Dificid but symptoms never completely resolved - still having periodic diarrhea as well as recent intractable n/v Imaging with concern for ileus Suspect that this is persistent (not recurrent) C diff infection Leukocytosis/elevated procalcitonin likely related to this issue Discussed with Dr. 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 Intensely hypermetabolic solid 5.7 cm posterior left perihilar lung mass, compatible with primary bronchogenic malignancy. 2. 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 3. Mildly hypermetabolic aortocaval retroperitoneal  adenopathy, suspicious for nodal metastasis. 4. Intensely hypermetabolic heterogeneous hypodense 1.7 cm right thyroid nodule, suspicious for thyroid malignancy. 5)  Hypermetabolic superficial subcutaneous 0.7 cm soft tissue lesion in the medial proximal right upper extremity, nonspecific, cannot exclude soft tissue metastasis. Thyroid US 7/9 with no evidence of malignancy and stable node size  Oncology to see s/p biopsy - discussed with Dr. Ellin Saba.  Oncology consulted and following. Continue oral Dilaudid as needed for pain.  Fentanyl patch was prescribed per recommendation from palliative care team but patient does not want to use this medication. She reports cough productive of persistent thick sputum, likely related to underlying malignancy.  She reports significant improvement with addition of Mucinex. Port-A-Cath insertion, biopsy of right axillary skin nodule on 06/24/23  Class 2 obesity due to excess calories with body mass index (BMI) of 36.0 to 36.9 in adult Nutrition Status: Nutrition Problem: Inadequate oral intake Etiology: inability to eat Signs/Symptoms: NPO status Interventions: Refer to RD note for recommendations  Obesity Body mass index is 36.65 kg/m.[name]  Weight loss should be encouraged Outpatient PCP/bariatric medicine f/u encouraged   Ileus [facility]) CT A/P with possible ileus Surgery consulting, non-surgical at this time   Body mass index is 36.65 kg/m.  Nutrition Documentation    Flowsheet Row ED to Hosp-Admission (Current) from 06/20/2023 in [facility] SURGICAL UNIT  Nutrition Problem Inadequate oral intake  Etiology inability to eat  Nutrition Goal Patient will meet greater than or equal to 90% of their needs  Interventions Boost Breeze, MVI      Consultants: Surgery Oncology   Procedures: None   Antibiotics: Oral vancomycin, 8/19-28    Subjective:  -No further diarrhea -No fever  Or chills  -Husband at bedside, questions  answered -Nausea persist -  Objective:  Vitals:   06/25/23 1319 06/25/23 1424  BP: (!) 108/58   Pulse: (!) 112 (!) 110  Resp: 18   Temp: 99.1 F (37.3 C) 98.9 F (37.2 C)  SpO2:  95%    Intake/Output Summary (Last 24 hours) at 06/25/2023 1808 Last data filed at 06/25/2023 0941 Gross per 24 hour  Intake 480 ml  Output --  Net 480 ml   Filed Weights   06/20/23 1049  Weight: 103 kg    Exam:  Physical Exam Gen:- Awake Alert, in no acute distress  HEENT:- [place].AT, No sclera icterus Neck-Supple Neck,No JVD,.  Lungs-  CTAB , fair air movement bilaterally  CV- S1, S2 normal, RRR Abd-  +ve B.Sounds, Abd Soft, No tenderness,    Extremity/Skin:- +ve  edema,   good pedal pulses  Psych-affect is appropriate, oriented x3 Neuro-generalized weakness, no new focal deficits, no tremors    Family Communication: Husband at bedside Disposition: Home in 1 to 2 days if continues to improve Status is: Inpatient Remains inpatient appropriate because: ongoing evaluation and treatment  Planned Discharge Destination: Home DVT prophylaxis: Enoxaparin (lovenox) injection 40 mg    Author: Shon Hale, MD 06/25/2023 6:08 PM  For on call review [email or web address].

## 2023-06-26 DIAGNOSIS — A0472 Enterocolitis due to Clostridium difficile, not specified as recurrent: Secondary | ICD-10-CM | POA: Diagnosis not present

## 2023-06-26 LAB — CBC
HCT: 32.8 % — ABNORMAL LOW (ref 36.0–46.0)
Hemoglobin: 10.3 g/dL — ABNORMAL LOW (ref 12.0–15.0)
MCH: 25.9 pg — ABNORMAL LOW (ref 26.0–34.0)
MCHC: 31.4 g/dL (ref 30.0–36.0)
MCV: 82.4 fL (ref 80.0–100.0)
Platelets: 424 10*3/uL — ABNORMAL HIGH (ref 150–400)
RBC: 3.98 MIL/uL (ref 3.87–5.11)
RDW: 15.2 % (ref 11.5–15.5)
WBC: 11.4 10*3/uL — ABNORMAL HIGH (ref 4.0–10.5)
nRBC: 0 % (ref 0.0–0.2)

## 2023-06-26 LAB — COMPREHENSIVE METABOLIC PANEL
ALT: 66 U/L — ABNORMAL HIGH (ref 0–44)
AST: 57 U/L — ABNORMAL HIGH (ref 15–41)
Albumin: 2 g/dL — ABNORMAL LOW (ref 3.5–5.0)
Alkaline Phosphatase: 123 U/L (ref 38–126)
Anion gap: 9 (ref 5–15)
BUN: 6 mg/dL — ABNORMAL LOW (ref 8–23)
CO2: 25 mmol/L (ref 22–32)
Calcium: 7.8 mg/dL — ABNORMAL LOW (ref 8.9–10.3)
Chloride: 97 mmol/L — ABNORMAL LOW (ref 98–111)
Creatinine, Ser: 0.6 mg/dL (ref 0.44–1.00)
GFR, Estimated: >60 mL/min (ref >60)
Glucose, Bld: 128 mg/dL — ABNORMAL HIGH (ref 70–99)
Potassium: 3.1 mmol/L — ABNORMAL LOW (ref 3.5–5.1)
Sodium: 131 mmol/L — ABNORMAL LOW (ref 135–145)
Total Bilirubin: 0.4 mg/dL (ref 0.3–1.2)
Total Protein: 5.5 g/dL — ABNORMAL LOW (ref 6.5–8.1)

## 2023-06-26 MED ORDER — PROMETHAZINE HCL 25 MG PO TABS: 25.0000 mg | ORAL_TABLET | Freq: Four times a day (QID) | ORAL | 1 refills | Status: DC | PRN

## 2023-06-26 MED ORDER — ADULT MULTIVITAMIN W/MINERALS CH: 1.0000 | ORAL_TABLET | Freq: Every day | ORAL | 1 refills | Status: AC

## 2023-06-26 MED ORDER — POTASSIUM CHLORIDE CRYS ER 20 MEQ PO TBCR: 20.0000 meq | EXTENDED_RELEASE_TABLET | Freq: Two times a day (BID) | ORAL | 0 refills | Status: DC

## 2023-06-26 MED ORDER — PROCHLORPERAZINE MALEATE 10 MG PO TABS: 10.0000 mg | ORAL_TABLET | Freq: Four times a day (QID) | ORAL | 1 refills | Status: DC | PRN

## 2023-06-26 MED ORDER — GUAIFENESIN ER 600 MG PO TB12: 1200.0000 mg | ORAL_TABLET | Freq: Two times a day (BID) | ORAL | 0 refills | Status: AC

## 2023-06-26 MED ORDER — VANCOMYCIN HCL 125 MG PO CAPS: 125.0000 mg | ORAL_CAPSULE | Freq: Four times a day (QID) | ORAL | 0 refills | Status: AC

## 2023-06-26 MED ORDER — FUROSEMIDE 20 MG PO TABS: 20.0000 mg | ORAL_TABLET | Freq: Every day | ORAL | 1 refills | Status: DC

## 2023-06-26 MED ORDER — POTASSIUM CHLORIDE CRYS ER 20 MEQ PO TBCR
40.0000 meq | EXTENDED_RELEASE_TABLET | Freq: Once | ORAL | Status: AC
  Administered 2023-06-26: 40 meq via ORAL
  Filled 2023-06-26: qty 2

## 2023-06-26 MED ORDER — OXYCODONE HCL 5 MG PO TABS: 5.0000 mg | ORAL_TABLET | Freq: Four times a day (QID) | ORAL | 0 refills | Status: DC | PRN

## 2023-06-26 MED ORDER — ALBUTEROL SULFATE HFA 108 (90 BASE) MCG/ACT IN AERS: 2.0000 | INHALATION_SPRAY | Freq: Four times a day (QID) | RESPIRATORY_TRACT | 2 refills | Status: DC | PRN

## 2023-06-26 MED ORDER — ACETAMINOPHEN 325 MG PO TABS: 650.0000 mg | ORAL_TABLET | Freq: Four times a day (QID) | ORAL | Status: DC | PRN

## 2023-06-26 MED ORDER — VANCOMYCIN HCL 125 MG PO CAPS: 125.0000 mg | ORAL_CAPSULE | Freq: Four times a day (QID) | ORAL | 0 refills | Status: DC

## 2023-06-26 MED ORDER — MORPHINE SULFATE (PF) 2 MG/ML IV SOLN: 2.0000 mg | Freq: Once | INTRAVENOUS | Status: DC | PRN

## 2023-06-26 MED ORDER — SACCHAROMYCES BOULARDII 250 MG PO CAPS: 250.0000 mg | ORAL_CAPSULE | Freq: Two times a day (BID) | ORAL | 0 refills | Status: DC

## 2023-06-26 NOTE — Discharge Instructions (Signed)
1)Please follow-up with hematologist/oncologist Dr. [name], MD--as previously advised -Address: inside [facility] (4th Floor), [address] [place], [place], [place] [address] Phone: [phone]  2) please complete vancomycin oral antibiotic as prescribed for C. difficile bacterial infection of your bowel  3) please take potassium as prescribed---  4) repeat CBC and BMP blood test in about 4 to 5 days strongly advised

## 2023-06-26 NOTE — Anesthesia Postprocedure Evaluation (Signed)
Anesthesia Post Note  Patient: Miranda Perkins  Procedure(s) Performed: INSERTION PORT-A-CATH (Chest) AXILLARY LYMPH NODE BIOPSY (Right: Arm Upper)  Patient location during evaluation: Phase II Anesthesia Type: General Level of consciousness: awake Pain management: pain level controlled Vital Signs Assessment: post-procedure vital signs reviewed and stable Respiratory status: spontaneous breathing and respiratory function stable Cardiovascular status: blood pressure returned to baseline and stable Postop Assessment: no headache and no apparent nausea or vomiting Anesthetic complications: no Comments: Late entry   No notable events documented.   Last Vitals:  Vitals:   06/25/23 2055 06/26/23 0457  BP: 107/64 106/63  Pulse: (!) 103 96  Resp: 18 16  Temp: 37 C 36.8 C  SpO2: 99% 96%    Last Pain:  Vitals:   06/26/23 0800  TempSrc:   PainSc: 0-No pain                 Windell Norfolk

## 2023-06-26 NOTE — Discharge Summary (Incomplete)
Miranda Perkins, is a 62 y.o. female  DOB 1961-09-10  MRN [id number].  Admission date:  06/20/2023  Admitting Physician  Jonah Blue, MD  Discharge Date:  06/26/2023   Primary MD  [name], DO  Recommendations for primary care physician for things to follow:   1)Please follow-up with hematologist/oncologist Dr. [name], MD--as previously advised -Address: inside [facility] (4th Floor), [address] [place], [place], [place] [address] Phone: [phone]  2) please complete vancomycin oral antibiotic as prescribed for C. difficile bacterial infection of your bowel  3) please take potassium as prescribed---  4) repeat CBC and BMP blood test in about 4 to 5 days strongly advised  Admission Diagnosis  Ileus (HCC) [K56.7] Intractable nausea and vomiting [R11.2] C. difficile colitis [A04.72]   Discharge Diagnosis  Ileus (HCC) [K56.7] Intractable nausea and vomiting [R11.2] C. difficile colitis [A04.72]  ***  Principal Problem:   recent history of C. difficile colitis Active Problems:   Hypokalemia   Malignant neoplasm of unspecified part of unspecified bronchus or lung (HCC)   Hilar mass   Ileus (HCC)   Class 2 obesity due to excess calories with body mass index (BMI) of 36.0 to 36.9 in adult   Metastatic primary lung cancer, left (HCC)      Past Medical History:  Diagnosis Date   Adrenal hemorrhage (HCC)    Anemia    Arthritis    Cancer (HCC)    skin cancer - basal   Dyspnea    Edema of both lower extremities    Endometrial polyp    Hirsutism    History of 2019 novel coronavirus disease (COVID-19) 12/21/2020   positive home result documented in pcp note in epic 12-23-2020,  residual doe, pulmonology consult w/ dr wert note in epic 03-20-2021   History of basal cell carcinoma (BCC) excision 2016   forehead, per pt no recurrence   History of CVA  (cerebrovascular accident) without residual deficits 1993   pt stated , while on Chloramphenicol, no residuaL; [medication given to treat tick bite] ; patient states  "i stroked out right next to my doctor , he said i passed out and that was my only symptom" ;  no resiudal   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 branch,  work-up results in epic 03/ 2022 (normal nuclear stress test, normal echo, no arrhythmia's per event monitor);   (05-28-2021 per pt no symptoms since 03/ 2022)   Multiple thyroid nodules    endocrinologist--- dr g. nida,  hx benign bx, clinically euthyroid   Osteopenia 12/2017   T score -1.2 FRAX 4.9% / 0.3%   PMB (postmenopausal bleeding)    Pneumonia    x 2   Post-COVID chronic dyspnea    Pre-diabetes    Rosacea    Stroke (HCC)  Thickened endometrium     Past Surgical History:  Procedure Laterality Date   ANTERIOR AND POSTERIOR REPAIR N/A 08/21/2020   Procedure: ANTERIOR (CYSTOCELE) AND POSTERIOR REPAIR (RECTOCELE);  Surgeon: Genia Del, MD;  Location: [facility];  Service: Gynecology;  Laterality: N/A;  requesting 9:00am OR time  requests one hour   ANTERIOR CERVICAL DECOMP/DISCECTOMY FUSION N/A 09/27/2017   Procedure: ANTERIOR CERVICAL DECOMPRESSION/DISCECTOMY FUSION CERVICAL FOUR-FIVE ,CERVICAL FIVE-SIX,CERVICAL SIX-SEVEN;  Surgeon: Donalee Citrin, MD;  Location: [facility] OR;  Service: Neurosurgery;  Laterality: N/A;   BRONCHIAL BIOPSY  06/14/2023   Procedure: BRONCHIAL BIOPSIES;  Surgeon: Leslye Peer, MD;  Location: [facility] ENDOSCOPY;  Service: Pulmonary;;   BRONCHIAL BRUSHINGS  06/14/2023   Procedure: BRONCHIAL BRUSHINGS;  Surgeon: Leslye Peer, MD;  Location: [facility] ENDOSCOPY;  Service: Pulmonary;;   BRONCHIAL NEEDLE ASPIRATION BIOPSY  06/14/2023   Procedure: BRONCHIAL NEEDLE ASPIRATION  BIOPSIES;  Surgeon: Leslye Peer, MD;  Location: [facility] END[facility]COPY;  Service: Pulmonary;;   CARPAL TUNNEL RELEASE Left 09/27/2017   Procedure: CARPAL TUNNEL RELEASE;  Surgeon: Donalee Citrin, MD;  Location: [facility] OR;  Service: Neurosurgery;  Laterality: Left;   DILATATION & CURETTAGE/HYSTEROSCOPY WITH MYOSURE N/A 06/03/2021   Procedure: DILATATION & CURETTAGE/HYSTEROSCOPY WITH MYOSURE;  Surgeon: Genia Del, MD;  Location: [facility];  Service: Gynecology;  Laterality: N/A;   FOOT SURGERY     bone removed from pinky toe   HEMOSTASIS CONTROL  06/14/2023   Procedure: HEMOSTASIS CONTROL;  Surgeon: Leslye Peer, MD;  Location: [facility] ENDOSCOPY;  Service: Pulmonary;;   INGUINAL HERNIA REPAIR Right 1995   KNEE ARTHROSCOPY W/ MENISCAL REPAIR Left 06/21/2017   dr Netta Corrigan   LEG SURGERY  1972   MOHS SURGERY  2016   BCC of forehead   MOUTH SURGERY  2015   removal and cauterization pallete of cancerous lesion   TONSILLECTOMY  1979   TONSILLECTOMY  1977   TOTAL KNEE ARTHROPLASTY Left 01/26/2018   Procedure: LEFT TOTAL KNEE ARTHROPLASTY;  Surgeon: Ranee Gosselin, MD;  Location: [facility] ORS;  Service: Orthopedics;  Laterality: Left;   TUBAL LIGATION     VIDEO BRONCHOSCOPY WITH ENDOBRONCHIAL ULTRASOUND Bilateral 06/14/2023   Procedure: VIDEO BRONCHOSCOPY WITH ENDOBRONCHIAL ULTRASOUND;  Surgeon: Leslye Peer, MD;  Location: [facility] ENDOSCOPY;  Service: Pulmonary;  Laterality: Bilateral;       HPI  from the history and physical done on the day of admission:     ***  ****     Hospital Course:     62yo female with h/o adrenal hemorrhage, postmenopausal vaginal bleeding, and C. Difficile infection 1 month ago who presented on 8/19 with abdominal pain.  CT with possible ileus.  Persistent n/v that has been intractable.  No longer with n/v, tolerating full liquid diet.  Worsening diarrhea on 8/21.  Treating for persistent C. Diff with PO Vanc.  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  ***** Assessment and Plan: * recent history of C. difficile colitis Patient with recent history of C difficile colitis It was treated with 10 days of Dificid but symptoms never completely resolved - still having periodic diarrhea as well as recent intractable n/v Imaging with concern for ileus Suspect that this is persistent (not recurrent) C diff infection Leukocytosis/elevated procalcitonin likely related to this issue Discussed with Dr. Ninetta Lights from ID - further testing is not needed, will repeat course of treatment Patient reports nausea from Dificid and would like an  alternate treatment option, so will treat with PO Vancomycin x 10 days Repeat AXR unremarkable  Hypokalemia Potassium 3.1 in the ED, repleted and still low this AM at 3.2 Will give an additional 40 mEq PO KCl at time Repeat BMP in AM  Malignant neoplasm of unspecified part of unspecified bronchus or lung (HCC) Recently diagnosed in July with bronchoscopy with biopsy performed on Monday, 8/12 by Dr. 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 Intensely hypermetabolic solid 5.7 cm posterior left perihilar lung mass, compatible with primary bronchogenic malignancy. 2. 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 3. Mildly hypermetabolic aortocaval retroperitoneal adenopathy, suspicious for nodal metastasis. 4. Intensely hypermetabolic heterogeneous hypodense 1.7 cm right thyroid nodule, suspicious for thyroid malignancy. 5)  Hypermetabolic superficial subcutaneous 0.7 cm soft tissue lesion in the medial proximal right upper extremity, nonspecific, cannot exclude soft tissue metastasis. Thyroid US 7/9 with no evidence of malignancy and stable node size  Oncology to see s/p biopsy - discussed with Dr. Ellin Saba and he will see her in the hospital Continue oral Dilaudid  as needed for pain.  Fentanyl patch was prescribed per recommendation from palliative care team but patient does not want to use this medication. She reports cough productive of persistent thick sputum, likely related to underlying malignancy.  She reports significant improvement with addition of Mucinex.  Class 2 obesity due to excess calories with body mass index (BMI) of 36.0 to 36.9 in adult Nutrition Status: Nutrition Problem: Inadequate oral intake Etiology: inability to eat Signs/Symptoms: NPO status Interventions: Refer to RD note for recommendations  Obesity Body mass index is 36.65 kg/m.[name]  Weight loss should be encouraged Outpatient PCP/bariatric medicine f/u encouraged   Ileus [facility] CT A/P with possible ileus Surgery consulting, non-surgical at this time        Discharge Condition: ***  Follow UP     Consults obtained - ***  Diet and Activity recommendation:  As advised  Discharge Instructions    **** Discharge Instructions     Call MD for:  difficulty breathing, headache or visual disturbances   Complete by: As directed    Call MD for:  persistant dizziness or light-headedness   Complete by: As directed    Call MD for:  persistant nausea and vomiting   Complete by: As directed    Call MD for:  temperature >100.4   Complete by: As directed    Diet - low sodium heart healthy   Complete by: As directed    Discharge instructions   Complete by: As directed    1)Please follow-up with hematologist/oncologist Dr. [name], MD--as previously advised -Address: inside [facility] (4th Floor), [address] [place], [place], [place] [address] Phone: [phone]  2) please complete vancomycin oral antibiotic as prescribed for C. difficile bacterial infection of your bowel  3) please take potassium as prescribed---  4) repeat CBC and BMP blood test in about 4 to 5 days strongly advised   Increase activity slowly   Complete by: As directed           Discharge Medications     Allergies as of 06/26/2023       Reactions   Influenza Vaccines Anaphylaxis, Other (See Comments)   Per patient   Penicillins Anaphylaxis, Other (See Comments)   Articaine Other (See Comments)   Caused infection   Cortisone Other (See Comments)   Turned red and ran a low grade fever for 3  days   Other Rash   bandaids- skin turns red and a rash         Medication List     STOP taking these medications    HYDROmorphone 2 MG tablet Commonly known as: Dilaudid   ibuprofen 600 MG tablet Commonly known as: ADVIL       TAKE these medications    acetaminophen 325 MG tablet Commonly known as: TYLENOL Take 2 tablets (650 mg total) by mouth every 6 (six) hours as needed for mild pain, headache or fever (or Fever >/= 101).   albuterol 108 (90 Base) MCG/ACT inhaler Commonly known as: VENTOLIN HFA Inhale 2 puffs into the lungs every 6 (six) hours as needed.   furosemide 20 MG tablet Commonly known as: Lasix Take 1 tablet (20 mg total) by mouth daily. What changed:  medication strength how much to take   guaiFENesin 600 MG 12 hr tablet Commonly known as: MUCINEX Take 2 tablets (1,200 mg total) by mouth 2 (two) times daily.   multivitamin with minerals Tabs tablet Take 1 tablet by mouth daily. Start taking on: June 27, 2023   ondansetron 8 MG tablet Commonly known as: ZOFRAN Take 1 tablet (8 mg total) by mouth every 8 (eight) hours as needed for nausea or vomiting.   oxyCODONE 5 MG immediate release tablet Commonly known as: Oxy IR/ROXICODONE Take 1 tablet (5 mg total) by mouth every 6 (six) hours as needed for moderate pain.   potassium chloride SA 20 MEQ tablet Commonly known as: KLOR-CON M Take 1 tablet (20 mEq total) by mouth 2 (two) times daily for 4 days.   prochlorperazine 10 MG tablet Commonly known as: COMPAZINE Take 1 tablet (10 mg total) by mouth every 6 (six) hours as needed for nausea or vomiting. What changed:  reasons to take this   progesterone 200 MG capsule Commonly known as: Prometrium Nightly 10 days every 3 months   promethazine 25 MG suppository Commonly known as: PHENERGAN Place 1 suppository (25 mg total) rectally every 6 (six) hours as needed for nausea or vomiting.   promethazine 25 MG tablet Commonly known as: PHENERGAN Take 1 tablet (25 mg total) by mouth every 6 (six) hours as needed for nausea or vomiting.   saccharomyces boulardii 250 MG capsule Commonly known as: FLORASTOR Take 1 capsule (250 mg total) by mouth 2 (two) times daily.   vancomycin 125 MG capsule Commonly known as: VANCOCIN Take 1 capsule (125 mg total) by mouth 4 (four) times daily for 8 days.        Major procedures and Radiology Reports - PLEASE review detailed and final reports for all details, in brief -   ***  DG Chest Port 1 View  Result Date: 06/24/2023 CLINICAL DATA:  Port-A-Cath placement EXAM: PORTABLE CHEST 1 VIEW COMPARISON:  06/14/2023 FINDINGS: Left Port-A-Cath tip at low SVC 2 superior caval/atrial junction. Right hemidiaphragm elevation.  Cervical spine fixation. Patient rotated left. Midline trachea. Normal heart size. No pleural effusion or pneumothorax. Left perihilar mass measures 6.1 cm today versus 5.5 cm on 06/14/2023. IMPRESSION: Left Port-A-Cath terminates at the low SVC or superior caval/atrial junction, without pneumothorax. Left perihilar mass measures slightly larger today than on 06/04/2023. Difference is favored to be due to mild obliquity. Electronically Signed   By: Jeronimo Greaves M.D.   On: 06/24/2023 19:10   DG C-Arm 1-60 Min-No Report  Result Date: 06/24/2023 Fluoroscopy was utilized by the requesting physician.  No radiographic interpretation.   DG Abd  1 View  Result Date: 06/21/2023 CLINICAL DATA:  Small bowel obstruction. EXAM: ABDOMEN - 1 VIEW COMPARISON:  June 20, 2023. FINDINGS: No abnormal bowel dilatation is noted. Surgical clips are noted in the pelvis.  IMPRESSION: No abnormal bowel dilatation. Electronically Signed   By: Lupita Raider M.D.   On: 06/21/2023 09:53   CT ABDOMEN PELVIS W CONTRAST  Result Date: 06/20/2023 CLINICAL DATA:  Abdominal pain for several weeks. History of lung cancer. * Tracking Code: BO * EXAM: CT ABDOMEN AND PELVIS WITH CONTRAST TECHNIQUE: Multidetector CT imaging of the abdomen and pelvis was performed using the standard protocol following bolus administration of intravenous contrast. RADIATION DOSE REDUCTION: This exam was performed according to the departmental dose-optimization program which includes automated exposure control, adjustment of the mA and/or kV according to patient size and/or use of iterative reconstruction technique. CONTRAST:  OMNIPAQUE IOHEXOL 300 MG/ML  SOLN COMPARISON:  06/11/2023 FINDINGS: Lower chest: Rapidly enlarging mass within the left hilar region is identified. This now measures at least 5.9 x 5.0 cm, image 9/4. On the PET-CT from 05/27/23 this measured 5.7 x 3.5 cm. New small right pleural effusion. Hepatobiliary: There is no suspicious liver abnormality. The gallbladder appears normal. No bile duct dilatation. Pancreas: Unremarkable. Spleen: No focal splenic abnormality. Spleen measures 12.6 cm cranial caudal Adrenals/Urinary Tract: Large solid and cystic mass centered around the right adrenal gland measures 10.3 by 7.9 by 13.7, image 22/2. On 06/11/2023 10.0 by 7.4 by 13.1 cm. Normal left adrenal gland. The kidneys appear unremarkable. No signs of nephrolithiasis, hydronephrosis or mass. Urinary bladder appears normal. Stomach/Bowel: Stomach appears within normal limits. Progressive distension of the small bowel measure up to 2.9 cm with a few air-fluid levels. The distal small bowel loops have a normal caliber up to the ileocecal valve. No bowel wall thickening or inflammation. Normal caliber of the colon. Vascular/Lymphatic: Aortic atherosclerosis. Aortocaval lymph node measures 1 cm, image  33/2. No pelvic or inguinal adenopathy. Reproductive: Small fibroid arises off the subserosal right uterine corpus measuring 2.1 cm, image 79/2. The endometrial stripe measures 9 mm in thickness which is considered abnormal in a postmenopausal female. No adnexal mass. Other: No significant free fluid or fluid collections. Musculoskeletal: No acute or significant osseous findings. IMPRESSION: 1. Progressive distension of the small bowel measure up to 2.9 cm with a few air-fluid levels. The distal small bowel loops have a normal caliber up to the ileocecal valve. Although nonspecific these findings may reflect developing small bowel ileus or early small-bowel obstruction. Consider serial follow-up radiographs of the abdomen. 2. Enlarging mass within the left hilar region is identified. The visualized portions in the left lower lobe measures 5.9 x 5.0 cm. On the PET-CT from 05/27/23 the perihilar portion measured up to 5.7 x 3.5 cm. 3. 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 On 06/11/2023 10.0 by 7.4 by 13.1 cm. 4. New small right pleural effusion. 5. Endometrial stripe measures 9 mm in thickness which is considered abnormal in a postmenopausal female. Consider further evaluation with pelvic ultrasound. 6.  Aortic Atherosclerosis (ICD10-I70.0). Electronically Signed   By: Signa Kell M.D.   On: 06/20/2023 17:50   MR Brain W Wo Contrast  Result Date: 06/16/2023 CLINICAL DATA:  Provided history: Malignant neoplasm of unspecified part of unspecified bronchus or lung. Non-small cell lung cancer, staging. EXAM: MRI HEAD WITHOUT AND WITH CONTRAST TECHNIQUE: Multiplanar, multiecho pulse sequences of the brain and surrounding structures were  obtained without and with intravenous contrast. CONTRAST:  10mL GADAVIST GADOBUTROL 1 MMOL/ML IV SOLN COMPARISON:  Head CT 04/09/2016. FINDINGS: Brain: Cerebral volume is normal. 2 mm focus of enhancement along the right central sulcus  which is indeterminate for a small cortical metastasis versus vascular enhancement (series 16, image 34). Small T2 hyperintense foci within the left thalamus and left lentiform nucleus, which may reflect prominent perivascular spaces or chronic lacunar infarcts. There are a few small foci of T2 FLAIR hyperintense signal abnormality within the cerebral white matter, nonspecific but compatible with minimal changes of chronic small vessel ischemia. There is no acute infarct. No chronic intracranial blood products. No extra-axial fluid collection. No midline shift. Vascular: Maintained flow voids within the proximal large arterial vessels. Small developmental venous anomaly within the anterior right frontal lobe (anatomic variant). Skull and upper cervical spine: No focal suspicious marrow lesion. Susceptibility artifact arising from ACDF hardware. Sinuses/Orbits: No mass or acute finding within the imaged orbits. No significant paranasal sinus disease. Impression #1 will be called to the ordering clinician or representative by the Radiologist Assistant, and communication documented in the PACS or [employer]. IMPRESSION: 1. 2 mm focus of enhancement along the right central sulcus, which is indeterminate for a small cortical metastasis versus vascular enhancement. A short-interval follow-up brain MRI (with and without contrast) is recommended in 6-8 weeks. 2. Small T2 hyperintense foci within the deep gray nuclei on the left, which may reflect prominent perivascular spaces or chronic lacunar infarcts. 3. Minimal chronic small vessel ischemic changes within the cerebral white matter. Electronically Signed   By: Jackey Loge D.O.   On: 06/16/2023 17:40   DG Chest Port 1 View  Result Date: 06/14/2023 CLINICAL DATA:  Status post bronchoscopy and biopsy EXAM: PORTABLE CHEST 1 VIEW COMPARISON:  05/07/2023 FINDINGS: Normal heart size. Left hilar mass. No acute abnormality of the lungs following bronchoscopy. The  visualized skeletal structures are unremarkable. IMPRESSION: No acute abnormality of the lungs status post bronchoscopy. Left hilar mass. Electronically Signed   By: Jearld Lesch M.D.   On: 06/14/2023 13:11   DG C-ARM BRONCHOSCOPY  Result Date: 06/14/2023 C-ARM BRONCHOSCOPY: Fluoroscopy was utilized by the requesting physician.  No radiographic interpretation.   CT ABDOMEN PELVIS W CONTRAST  Result Date: 06/11/2023 CLINICAL DATA:  62 year old female with history of abdominal pain. Nausea and vomiting for the past 6 weeks. Additional history of lung cancer. * Tracking Code: BO * EXAM: CT ABDOMEN AND PELVIS WITH CONTRAST TECHNIQUE: Multidetector CT imaging of the abdomen and pelvis was performed using the standard protocol following bolus administration of intravenous contrast. RADIATION DOSE REDUCTION: This exam was performed according to the departmental dose-optimization program which includes automated exposure control, adjustment of the mA and/or kV according to patient size and/or use of iterative reconstruction technique. CONTRAST:  75mL OMNIPAQUE IOHEXOL 350 MG/ML SOLN COMPARISON:  PET-CT 05/27/2023. CT of the abdomen and pelvis 05/20/2023. FINDINGS: Lower chest: Trace right pleural effusion lying dependently. Hepatobiliary: No definite suspicious cystic or solid hepatic lesions. No intra or extrahepatic biliary ductal dilatation. Gallbladder is unremarkable in appearance. Pancreas: No pancreatic mass. No pancreatic ductal dilatation. No pancreatic or peripancreatic fluid collections or inflammatory changes. Spleen: Unremarkable. Adrenals/Urinary Tract: Large heterogeneous appearing mixed cystic and solid right adrenal mass (axial image 25 of series 3 and coronal image 64 of series 6) which currently measures 10.6 x 7.2 x 14.6 cm, which exerts mass effect upon adjacent structures displacing the right lobe of the liver anteriorly and superiorly,  and making broad contact with the upper pole of the right  kidney which is mildly distorted by the adrenal mass. Left kidney and left adrenal gland are normal in appearance. No hydroureteronephrosis. Urinary bladder is unremarkable in appearance. Stomach/Bowel: The appearance of the stomach is normal. There is no pathologic dilatation of small bowel or colon. Numerous colonic diverticuli are noted, without surrounding inflammatory changes to suggest an acute diverticulitis at this time. The appendix is not confidently identified and may be surgically absent. Regardless, there are no inflammatory changes noted adjacent to the cecum to suggest the presence of an acute appendicitis at this time. Vascular/Lymphatic: Atherosclerosis in the abdominal aorta and pelvic vasculature. No lymphadenopathy noted in the abdomen or pelvis. Reproductive: Tubal ligation clips are noted bilaterally. Uterus and ovaries are otherwise unremarkable in appearance. Other: No significant volume of ascites.  No pneumoperitoneum. Musculoskeletal: There are no aggressive appearing lytic or blastic lesions noted in the visualized portions of the skeleton. IMPRESSION: 1. No definite acute findings are noted in the abdomen or pelvis. 2. Large right adrenal metastasis redemonstrated, slightly increased in size compared to prior studies, most compatible with a metastatic lesion. 3. Trace right pleural effusion lying dependently. 4. Aortic atherosclerosis. 5. Additional incidental findings, as above. Electronically Signed   By: Trudie Reed M.D.   On: 06/11/2023 05:20   NM PET Image Initial (PI) Skull Base To Thigh  Result Date: 06/04/2023 CLINICAL DATA:  Initial treatment strategy for left lung mass. EXAM: NUCLEAR MEDICINE PET SKULL BASE TO THIGH TECHNIQUE: 11.6 mCi F-18 FDG was injected intravenously. Full-ring PET imaging was performed from the skull base to thigh after the radiotracer. CT data was obtained and used for attenuation correction and anatomic localization. Fasting blood glucose: 98  mg/dl COMPARISON:  40/98/1191 chest CT angiogram and CT abdomen/pelvis. FINDINGS: Mediastinal blood pool activity: SUV max 2.3 Liver activity: SUV max NA NECK: No hypermetabolic lymph nodes in the neck. Intensely hypermetabolic heterogeneous hypodense 1.7 cm right thyroid nodule with max SUV 41.9. Incidental CT findings: None. CHEST: Intensely hypermetabolic solid 5.7 x 4.1 cm posterior left perihilar lung mass with max SUV 20.7 (series 202/image 97). No additional hypermetabolic pulmonary findings. Indistinct 1.3 cm medial left lower lobe nodule with no significant FDG uptake (series 203/image 90), unchanged from recent chest CT. Posterior apical right upper lobe indistinct 0.5 cm nodule, below PET resolution, unchanged (series 203/image 46). No enlarged or hypermetabolic mediastinal, right hilar or axillary nodes. Hypermetabolic superficial subcutaneous 0.7 cm soft tissue lesion in medial proximal right upper extremity with max SUV 5.6 (series 203/image 80). Incidental CT findings: Atherosclerotic nonaneurysmal thoracic aorta. ABDOMEN/PELVIS: Hypermetabolic heterogeneously hyperdense 9.4 x 7.5 cm right adrenal mass with max SUV 28.1 with small amount of ill-defined and surrounding layering fluid in the right retroperitoneum, compatible with hemorrhagic right adrenal metastasis. Mildly enlarged and mildly hypermetabolic 1.0 cm aortocaval node with max SUV 4.5 (series 2 series 2/image 159). No additional enlarged or hypermetabolic abdominopelvic nodes. No abnormal hypermetabolic activity within the liver, pancreas, left adrenal gland, or spleen. Incidental CT findings: Atherosclerotic nonaneurysmal abdominal aorta. SKELETON: No focal hypermetabolic activity to suggest skeletal metastasis. Incidental CT findings: Surgical hardware from ACDF. IMPRESSION: 1. Intensely hypermetabolic solid 5.7 cm posterior left perihilar lung mass, compatible with primary bronchogenic malignancy. 2. 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 3. Mildly hypermetabolic aortocaval retroperitoneal adenopathy, suspicious for nodal metastasis. 4. Intensely hypermetabolic heterogeneous hypodense 1.7 cm right thyroid nodule, suspicious for thyroid malignancy. Recommend non-emergent thyroid ultrasound and biopsy  of nodule if detected. Reference: J Am Coll Radiol. 2015 Feb;12(2): 143-50 5. Hypermetabolic superficial subcutaneous 0.7 cm soft tissue lesion in the medial proximal right upper extremity, nonspecific, cannot exclude soft tissue metastasis. 6. Additional smaller indistinct bilateral pulmonary nodules without significant FDG uptake, recommend attention on follow-up noncontrast chest CT. 7.  Aortic Atherosclerosis (ICD10-I70.0). Electronically Signed   By: Delbert Phenix M.D.   On: 06/04/2023 15:44    Micro Results   *** No results found for this or any previous visit (from the past 240 hour(s)).  Today   Subjective    Ellionna Aurand today has no ***          Patient has been seen and examined prior to discharge   Objective   Blood pressure 129/67, pulse (!) 104, temperature 98.6 F (37 C), temperature source Oral, resp. rate 18, height 5\' 6"  (1.676 m), weight 103 kg, SpO2 97%.   Intake/Output Summary (Last 24 hours) at 06/26/2023 1620 Last data filed at 06/26/2023 1300 Gross per 24 hour  Intake 720 ml  Output --  Net 720 ml    Exam Gen:- Awake Alert, no acute distress *** HEENT:- [place].AT, No sclera icterus Neck-Supple Neck,No JVD,.  Lungs-  CTAB , good air movement bilaterally CV- S1, S2 normal, regular Abd-  +ve B.Sounds, Abd Soft, No tenderness,    Extremity/Skin:- No  edema,   good pulses Psych-affect is appropriate, oriented x3 Neuro-no new focal deficits, no tremors ***   Data Review   CBC w Diff:  Lab Results  Component Value Date   WBC 11.4 (H) 06/26/2023   HGB 10.3 (L) 06/26/2023   HGB 12.9 04/06/2023   HCT 32.8 (L) 06/26/2023   HCT 39.8 04/06/2023   PLT 424 (H) 06/26/2023    PLT 343 04/06/2023   LYMPHOPCT 14 06/22/2023   MONOPCT 10 06/22/2023   EOSPCT 0 06/22/2023   BASOPCT 0 06/22/2023    CMP:  Lab Results  Component Value Date   NA 131 (L) 06/26/2023   NA 143 04/06/2023   K 3.1 (L) 06/26/2023   CL 97 (L) 06/26/2023   CO2 25 06/26/2023   BUN 6 (L) 06/26/2023   BUN 8 04/06/2023   CREATININE 0.60 06/26/2023   PROT 5.5 (L) 06/26/2023   PROT 6.7 04/06/2023   ALBUMIN 2.0 (L) 06/26/2023   ALBUMIN 4.4 04/06/2023   BILITOT 0.4 06/26/2023   BILITOT 0.5 04/06/2023   ALKPHOS 123 06/26/2023   AST 57 (H) 06/26/2023   ALT 66 (H) 06/26/2023  .  Total Discharge time is about 33 minutes  Shon Hale M.D on 06/26/2023 at 4:20 PM  Go to [email or web address] -  for contact info  [facility] - Office  [phone]

## 2023-06-28 ENCOUNTER — Telehealth: Payer: Self-pay

## 2023-06-28 ENCOUNTER — Encounter (HOSPITAL_COMMUNITY): Payer: Self-pay | Admitting: General Surgery

## 2023-06-28 NOTE — Transitions of Care (Post Inpatient/ED Visit) (Signed)
06/28/2023  Name: Miranda Perkins MRN: [id number] DOB: 11-Jan-1961  Today's TOC FU Call Status: Today's TOC FU Call Status:: Successful TOC FU Call Completed TOC FU Call Complete Date: 06/28/23 Patient's Name and Date of Birth confirmed.  Transition Care Management Follow-up Telephone Call Date of Discharge: 06/26/23 [facility] Type of Discharge: Inpatient Admission Primary Inpatient Discharge Diagnosis:: nausea/vomiting How have you been since you were released from the hospital?: Better Any questions or concerns?: No  Items Reviewed: Did you receive and understand the discharge instructions provided?: Yes Medications obtained,verified, and reconciled?: Yes (Medications Reviewed) Any new allergies since your discharge?: Yes Dietary orders reviewed?: Yes Do you have support at home?: Yes People in Home: spouse  Medications Reviewed Today: Medications Reviewed Today     Reviewed by Karena Addison, LPN (Licensed Practical Nurse) on 06/28/23 at 1506  Med List Status: <None>   Medication Order Taking? Sig Documenting Provider Last Dose Status Informant  acetaminophen (TYLENOL) 325 MG tablet [id number]  Take 2 tablets (650 mg total) by mouth every 6 (six) hours as needed for mild pain, headache or fever (or Fever >/= 101). Shon Hale, MD  Active   albuterol (VENTOLIN HFA) 108 (90 Base) MCG/ACT inhaler [id number]  Inhale 2 puffs into the lungs every 6 (six) hours as needed. Shon Hale, MD  Active   furosemide (LASIX) 20 MG tablet [id number]  Take 1 tablet (20 mg total) by mouth daily. Shon Hale, MD  Active   guaiFENesin (MUCINEX) 600 MG 12 hr tablet [id number]  Take 2 tablets (1,200 mg total) by mouth 2 (two) times daily. Shon Hale, MD  Active   Multiple Vitamin (MULTIVITAMIN WITH MINERALS) TABS tablet [id number]  Take 1 tablet by mouth daily. Shon Hale, MD  Active   ondansetron (ZOFRAN) 8 MG tablet [id number] No Take 1 tablet (8  mg total) by mouth every 8 (eight) hours as needed for nausea or vomiting. [name], MD Past Week Active Self, Pharmacy Records  oxyCODONE (OXY IR/ROXICODONE) 5 MG immediate release tablet [id number]  Take 1 tablet (5 mg total) by mouth every 6 (six) hours as needed for moderate pain. Shon Hale, MD  Active   potassium chloride SA (KLOR-CON M) 20 MEQ tablet [id number]  Take 1 tablet (20 mEq total) by mouth 2 (two) times daily for 4 days. Shon Hale, MD  Active   prochlorperazine (COMPAZINE) 10 MG tablet [id number]  Take 1 tablet (10 mg total) by mouth every 6 (six) hours as needed for nausea or vomiting. Shon Hale, MD  Active   progesterone (PROMETRIUM) 200 MG capsule [id number] No Nightly 10 days every 3 months Eure, Amaryllis Dyke, MD Taking Active Self, Pharmacy Records           Med Note Mayford Knife, [place] S   Fri May 21, 2023 10:55 AM) Pt has not started yet.  promethazine (PHENERGAN) 25 MG suppository [id number] No Place 1 suppository (25 mg total) rectally every 6 (six) hours as needed for nausea or vomiting. [name], MD Past Week Active Self, Pharmacy Records  promethazine (PHENERGAN) 25 MG tablet [id number]  Take 1 tablet (25 mg total) by mouth every 6 (six) hours as needed for nausea or vomiting. Shon Hale, MD  Active   saccharomyces boulardii (FLORASTOR) 250 MG capsule [id number]  Take 1 capsule (250 mg total) by mouth 2 (two) times daily. Shon Hale, MD  Active   vancomycin (VANCOCIN) 125 MG capsule [id number]  Take 1 capsule (125 mg total) by mouth 4 (  four) times daily for 8 days. Shon Hale, MD  Active   Med List Note Benjamine Mola 05/07/23 1326): [employer] patient            Home Care and Equipment/Supplies: Were Home Health Services Ordered?: NA Any new equipment or medical supplies ordered?: NA  Functional Questionnaire: Do you need assistance with bathing/showering or dressing?: No Do you need assistance with meal  preparation?: No Do you need assistance with eating?: No Do you have difficulty maintaining continence: No Do you need assistance with getting out of bed/getting out of a chair/moving?: No Do you have difficulty managing or taking your medications?: No  Follow up appointments reviewed: PCP Follow-up appointment confirmed?: No (no avail appts, sent message to staff) MD Provider Line Number:[phone] Given: No Specialist Hospital Follow-up appointment confirmed?: Yes Date of Specialist follow-up appointment?: 06/29/23 Follow-Up Specialty Provider:: radiology Do you need transportation to your follow-up appointment?: No Do you understand care options if your condition(s) worsen?: Yes-patient verbalized understanding    SIGNATURE Karena Addison, LPN [facility] Nurse Health Advisor Direct Dial [phone]

## 2023-06-29 ENCOUNTER — Other Ambulatory Visit: Payer: Self-pay | Admitting: *Deleted

## 2023-06-29 ENCOUNTER — Other Ambulatory Visit: Payer: Self-pay | Admitting: Family Medicine

## 2023-06-29 MED ORDER — OXYCODONE HCL 5 MG PO TABS: 5.0000 mg | ORAL_TABLET | ORAL | 0 refills | Status: DC | PRN

## 2023-06-29 NOTE — Telephone Encounter (Signed)
I believe pt is under hospice care.  Typically, they manage meds.  Can you please call and verify?  I think she is using [place]

## 2023-06-30 ENCOUNTER — Other Ambulatory Visit: Payer: Self-pay | Admitting: *Deleted

## 2023-06-30 LAB — SURGICAL PATHOLOGY

## 2023-06-30 MED ORDER — OXYCODONE HCL 5 MG PO TABS: 5.0000 mg | ORAL_TABLET | ORAL | 0 refills | Status: AC | PRN

## 2023-06-30 MED ORDER — PROCHLORPERAZINE MALEATE 10 MG PO TABS: 10.0000 mg | ORAL_TABLET | Freq: Four times a day (QID) | ORAL | 1 refills | Status: DC | PRN

## 2023-07-01 ENCOUNTER — Inpatient Hospital Stay

## 2023-07-01 ENCOUNTER — Inpatient Hospital Stay: Admitting: Hematology

## 2023-07-01 ENCOUNTER — Inpatient Hospital Stay: Admitting: Dietician

## 2023-07-01 ENCOUNTER — Other Ambulatory Visit: Payer: Self-pay

## 2023-07-01 DIAGNOSIS — C3490 Malignant neoplasm of unspecified part of unspecified bronchus or lung: Secondary | ICD-10-CM | POA: Diagnosis not present

## 2023-07-01 DIAGNOSIS — R197 Diarrhea, unspecified: Secondary | ICD-10-CM | POA: Insufficient documentation

## 2023-07-01 DIAGNOSIS — R110 Nausea: Secondary | ICD-10-CM | POA: Insufficient documentation

## 2023-07-01 DIAGNOSIS — R112 Nausea with vomiting, unspecified: Secondary | ICD-10-CM

## 2023-07-01 DIAGNOSIS — Z79899 Other long term (current) drug therapy: Secondary | ICD-10-CM | POA: Diagnosis not present

## 2023-07-01 DIAGNOSIS — C3492 Malignant neoplasm of unspecified part of left bronchus or lung: Secondary | ICD-10-CM | POA: Insufficient documentation

## 2023-07-01 DIAGNOSIS — G893 Neoplasm related pain (acute) (chronic): Secondary | ICD-10-CM | POA: Insufficient documentation

## 2023-07-01 DIAGNOSIS — Z87891 Personal history of nicotine dependence: Secondary | ICD-10-CM | POA: Insufficient documentation

## 2023-07-01 DIAGNOSIS — E876 Hypokalemia: Secondary | ICD-10-CM

## 2023-07-01 DIAGNOSIS — C7971 Secondary malignant neoplasm of right adrenal gland: Secondary | ICD-10-CM | POA: Insufficient documentation

## 2023-07-01 LAB — CBC WITH DIFFERENTIAL/PLATELET
Abs Immature Granulocytes: 0.08 10*3/uL — ABNORMAL HIGH (ref 0.00–0.07)
Basophils Absolute: 0 10*3/uL (ref 0.0–0.1)
Basophils Relative: 0 %
Eosinophils Absolute: 0 10*3/uL (ref 0.0–0.5)
Eosinophils Relative: 0 %
HCT: 35.4 % — ABNORMAL LOW (ref 36.0–46.0)
Hemoglobin: 10.9 g/dL — ABNORMAL LOW (ref 12.0–15.0)
Immature Granulocytes: 1 %
Lymphocytes Relative: 7 %
Lymphs Abs: 0.9 10*3/uL (ref 0.7–4.0)
MCH: 25.3 pg — ABNORMAL LOW (ref 26.0–34.0)
MCHC: 30.8 g/dL (ref 30.0–36.0)
MCV: 82.1 fL (ref 80.0–100.0)
Monocytes Absolute: 0.8 10*3/uL (ref 0.1–1.0)
Monocytes Relative: 7 %
Neutro Abs: 10.2 10*3/uL — ABNORMAL HIGH (ref 1.7–7.7)
Neutrophils Relative %: 85 %
Platelets: 481 10*3/uL — ABNORMAL HIGH (ref 150–400)
RBC: 4.31 MIL/uL (ref 3.87–5.11)
RDW: 15.7 % — ABNORMAL HIGH (ref 11.5–15.5)
WBC: 12 10*3/uL — ABNORMAL HIGH (ref 4.0–10.5)
nRBC: 0 % (ref 0.0–0.2)

## 2023-07-01 LAB — COMPREHENSIVE METABOLIC PANEL
ALT: 28 U/L (ref 0–44)
AST: 16 U/L (ref 15–41)
Albumin: 2.4 g/dL — ABNORMAL LOW (ref 3.5–5.0)
Alkaline Phosphatase: 169 U/L — ABNORMAL HIGH (ref 38–126)
Anion gap: 8 (ref 5–15)
BUN: 8 mg/dL (ref 8–23)
CO2: 27 mmol/L (ref 22–32)
Calcium: 7.8 mg/dL — ABNORMAL LOW (ref 8.9–10.3)
Chloride: 97 mmol/L — ABNORMAL LOW (ref 98–111)
Creatinine, Ser: 0.6 mg/dL (ref 0.44–1.00)
GFR, Estimated: >60 mL/min (ref >60)
Glucose, Bld: 126 mg/dL — ABNORMAL HIGH (ref 70–99)
Potassium: 3.1 mmol/L — ABNORMAL LOW (ref 3.5–5.1)
Sodium: 132 mmol/L — ABNORMAL LOW (ref 135–145)
Total Bilirubin: 0.8 mg/dL (ref 0.3–1.2)
Total Protein: 6 g/dL — ABNORMAL LOW (ref 6.5–8.1)

## 2023-07-01 LAB — MAGNESIUM: Magnesium: 2 mg/dL (ref 1.7–2.4)

## 2023-07-01 MED ORDER — SODIUM CHLORIDE 0.9% FLUSH
10.0000 mL | INTRAVENOUS | Status: DC | PRN
  Administered 2023-07-01: 10 mL via INTRAVENOUS

## 2023-07-01 MED ORDER — FOLIC ACID 1 MG PO TABS: 1.0000 mg | ORAL_TABLET | Freq: Every day | ORAL | 3 refills | Status: DC

## 2023-07-01 MED ORDER — HEPARIN SOD (PORK) LOCK FLUSH 100 UNIT/ML IV SOLN
500.0000 [IU] | Freq: Once | INTRAVENOUS | Status: AC
  Administered 2023-07-01: 500 [IU] via INTRAVENOUS

## 2023-07-01 MED ORDER — CYANOCOBALAMIN 1000 MCG/ML IJ SOLN
1000.0000 ug | Freq: Once | INTRAMUSCULAR | Status: AC
  Administered 2023-07-01: 1000 ug via INTRAMUSCULAR
  Filled 2023-07-01: qty 1

## 2023-07-01 MED ORDER — ONDANSETRON HCL 4 MG/2ML IJ SOLN
8.0000 mg | Freq: Once | INTRAMUSCULAR | Status: AC
  Administered 2023-07-01: 8 mg via INTRAVENOUS
  Filled 2023-07-01: qty 4

## 2023-07-01 MED ORDER — SODIUM CHLORIDE 0.9 % IV SOLN: Freq: Once | INTRAVENOUS | Status: AC

## 2023-07-01 MED ORDER — LIDOCAINE-PRILOCAINE 2.5-2.5 % EX CREA: TOPICAL_CREAM | CUTANEOUS | 0 refills | Status: DC

## 2023-07-01 MED ORDER — PROCHLORPERAZINE MALEATE 10 MG PO TABS: 10.0000 mg | ORAL_TABLET | Freq: Four times a day (QID) | ORAL | 2 refills | Status: DC | PRN

## 2023-07-01 MED ORDER — POTASSIUM CHLORIDE CRYS ER 20 MEQ PO TBCR
20.0000 meq | EXTENDED_RELEASE_TABLET | Freq: Once | ORAL | Status: AC
  Administered 2023-07-01: 20 meq via ORAL
  Filled 2023-07-01: qty 1

## 2023-07-01 MED ORDER — SODIUM CHLORIDE 0.9% FLUSH
10.0000 mL | INTRAVENOUS | Status: DC | PRN
  Administered 2023-07-01: 10 mL

## 2023-07-01 NOTE — Progress Notes (Signed)
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 Pt has completed 4/5 planned fractions to right adrenal under the care of Dr. Langston Masker (final 8/30). Treatment plan currently under work-up. Pt is under the care of Dr. Ellin Saba  8/18-8/24 [facility] admission with ileus, persistent C diff 8/9-8/11 [facility] admission with intractable N/V  Met with pt and husband in infusion. She is receiving supportive care with IVF. Pt nauseous at visit. Zofran works fairly well. Reports coughing with mucous from cancer. She is spitting this out as much as possible. This along with pain is improving with radiation. Husband reports he has been able to get a small amount of food in her daily since hospital discharge (panera chx noodle soup, hamburger helper, potato wedges). Last night, pt tolerated bowl of mac/cheese. Pt had one slice of cantaloupe and hash brown this morning. Pt reports one episode of diarrhea before eating breakfast. She will complete oral vancomycin 8/30 for C diff. She is drinking ~24 ounces water, some gatorade, flat sprite.   Nutrition Focused Physical Exam: deferred (nausea)   Medications: folvite, MVI, zofran, oxycodone, klor-con, compazine, vancoycin   Labs: Na 132, K 3.1, glucose 126, albumin 2.4   Anthropometrics: Wts have decreased 8% (21 lbs) from 248 lb on 6/4 - severe for time frame  Height: 5'6" Weight: 227 lb 1.2 oz (8/18) UBW: 252 lb (11/10/22) BMI: 37.87   NUTRITION DIAGNOSIS: Unintended wt loss secondary to cancer and altered GI function as evidenced by nausea with vomiting, persistent C diff, 8% wt loss in 2 months   INTERVENTION:  Educated on strategies for nausea + diarrhea, foods best tolerated and foods to limit/avoid - offered suggestions, handout with list of foods, tips, + recipes  Discussed small frequent meals/snacks q2h - handout provided Discussed protein foods,  include protein source at every meal - soft moist high protein foods list provided  Take antiemetics as prescribed Samples of Ensure Clear, Banatrol, Unjury provided    MONITORING, EVALUATION, GOAL: Pt will tolerate increased calories and protein to minimize further wt loss   Next Visit: Monday September 9 via telephone

## 2023-07-01 NOTE — Progress Notes (Signed)
Patient presents today for possible IVF.  Patient is not feeling well today.  Patient c/o nausea and vomiting the last few days.  Pain is 3/10.  Patient is also having vaginal bleeding x 2 weeks.  Vaginal bleeding has worsened.  MD made aware.  Sodium today is 132 and potassium is 3.1.  We will give 1 L of NS over 2 hours, Klor Con 20 mEq PO x one dose, and Zofran 8 mg IV x one dose today per Dr. Ellin Saba.

## 2023-07-01 NOTE — Progress Notes (Signed)
Discharged from clinic via wheelchair in stable condition. Alert and oriented x 3. F/U with [facility] as scheduled.

## 2023-07-01 NOTE — Progress Notes (Signed)
[facility] [address], [place] [address]   Clinic Day:  07/01/23   Referring physician: [name], DO  Patient Care Team: [name], DO as PCP - General (Family Medicine) [name] [name], MD as PCP - Cardiology (Cardiology) [name], MD as Medical Oncologist (Medical Oncology) [name], RN as Oncology Nurse Navigator (Medical Oncology)   ASSESSMENT & PLAN:   Assessment:  1.  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  Continues along the superior margin of the right kidney. - CTAP on 05/07/2023: Interval increase in size of the right adrenal gland measuring 7.6 cm, previously 4.9 cm.  A large area of decreased attenuation noted likely related to resolving hematoma.  No active hemorrhage seen. - CTAP (05/20/2023): Further enlargement of right adrenal gland measuring 8.9 x 6.7 cm, interim finding of moderate slightly loculated fluid surrounding the superior pole of the right kidney. - CT chest angiogram (05/20/2023): Solid left hilar mass measuring 5.3 x 3.9 cm with partial encasement of left-sided pulmonary vessels.  Mild narrowing of the left lower lobe bronchi but no occlusion.  Irregular focus in the left lower lobe measuring 16 x 8 mm. - Guardant360: BRIP1, PTEN deletion,STK11,KRAS G12A, KRAS amplification  2.  Social/family history: - Lives with husband at home.  Independent of ADLs and IADLs.  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  Quit cigarette smoking in 2010 and smoked half pack per day for 15 years. - No family history of bleeding.  Mother had 3 miscarriages and 5 levels.   No family history of antiphospholipid syndrome.  Father and paternal grandfather had cancer.  Maternal first cousin had blood cancer.  Plan:  1.  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 - I have also talked to Dr. Venetia Night who has confirmed that despite being negative for TTF-1 and Napsin A, this most likely originates from lung. - I have recommended NGS testing. - I have also recommended cancer type ID testing. - Based on Guardant360 results with STK 11, K-ras G 12A mutations, recommended chemoimmunotherapy combination with carboplatin, pemetrexed, tremelimumab and Durvalumab. - However will wait for final results of NGS testing prior to initiating immunotherapy.  We will most likely proceed with carboplatin and pemetrexed. - We discussed side effects in detail. - We will give her vitamin B12 injection and folic acid 1 mg tablet daily. - Will likely start next week.  2.  Abdominal and right back pain: - She is completing 5 treatments of radiation therapy on 07/02/2023. - Pain has overall improved.  She rates it as less than 6/10. - She is requiring oxycodone 2 to 3 tablets/day.  3.  Nausea: - Continue Zofran every 6 hours as needed 20 minutes prior to meals. - Will also give her Compazine 10 mg every 6 hours as backup.  4.  C. difficile colitis: - She will finish second course of vancomycin on 07/04/2023.  She was already treated with Dificid.  5.  Vaginal bleeding: - She had previous history of vaginal spotting and was seen by Dr. Despina Hidden.  She reported slight worsening of vaginal bleeding in the last few days. - She was recommended to start progesterone which was prescribed by Dr. Despina Hidden.  6.  Nutrition: - Increase protein intake.  Follow guidelines by our dietitian.  7.  Leg swellings: - Most likely from hypoalbuminemia. - Continue Lasix 40 mg daily.  Will titrate up as needed.   No  orders of the defined types were placed in this encounter.     Alben Deeds Teague,acting as a Neurosurgeon for [name], MD.,have documented all relevant documentation on the behalf of [name], MD,as directed by  [name], MD while in the presence of [name], MD.  I, [name] MD, have reviewed the above documentation for accuracy and completeness, and I agree with the above.    [name], MD   8/29/20247:21 PM  CHIEF COMPLAINT/PURPOSE OF CONSULT:   Diagnosis: Metastatic lung cancer to the adrenal gland  Current Therapy: Under workup  HISTORY OF PRESENT ILLNESS:   Miranda Perkins is a 62 y.o. female presenting to clinic today for evaluation of hilar mass at the request of Nadine Counts, Ashly M, DO.  She was admitted to the ED on 05/20/23 for right flank pain. She was found by CT to have a left hilar mass that is suspicious for malignancy. She was having very mild hemoptysis. She was given pain management with oxycodone and discharged on 7/19.   While in the ED she underwent a CT A/P that found: a solid left hilar mass measuring up to 5.3 cm, suspicious for lung malignancy; irregular focus of airspace disease in the left lower lobe measuring 16 x 8 mm, indeterminate in appearance for focus of infection, inflammation, or possible nodule; additional 7 mm right upper lobe ground-glass nodule; and further enlargement of the right adrenal gland since most recent prior, suspect that there is underlying right adrenal mass that is complicated by hemorrhage.  Her appetite level is at 0%. Her energy level is at 0%. She is accompanied by her husband and her daughter.   She notes pain in the abdomen that radiates to the back has not improved. Pain is like a band across her abdomen and is worsened on the left side. She was given dilaudid while hospitalized and noted that it improved pain. She was then given oxycodone and said it did not improve  symptoms. She c/o consistent nausea and vomiting that worsens when she takes oxycodone. She attempted to treat nausea with compazine and said it did not help. None of these medications make her drowsy. She has not been able to take in fluids or solids since discharge. She also c/o diarrhea that is only liquid and mucus for the past 3 days with bowel movements 3-4 times a day. She did not see any blood with stools. She notes of hemoptysis on and off for around 2 months. She saw a doctor and an NP for hemoptysis that said she had clear lungs and was sent home. She has recalled 2 instances of clots of blood with the cough in the past 6 weeks, and notes that otherwise there is little blood.  She notes of a change in vision since her pain started 6 months ago. She denies double vision or headaches. She has mild claustrophobia. She has not tried Xanax or Ativan.    She has a PET scheduled tomorrow at 11 am.  She sees Dr. Delton Coombes on 7/29.   INTERVAL HISTORY:   Miranda Perkins is a 62 y.o. female presenting  to the clinic today for follow-up of Metastatic lung cancer to the adrenal gland. She was last seen by me on 05/26/23 in consultation.  Since her last visit, she underwent a bronchoscopy with Dr. Delton Coombes on 06/14/23. Cytology results revealed in the left hilar mass of the lung and the biopsy of the endobronchial LLL: poorly differentiated malignancy. Cytology of the RUL of the lung revealed: no malignant cells.   Of note, she presented to the ED on 06/11/23 for upper abdominal pain, nausea, and vomiting. She was admitted to the ED on 06/20/23 for ileus with intractable nausea and vomiting, and persistent C. Diff colitis. She was given PO Vancomycin x 10 days and antiemetics on discharge which improved symptoms. She had her port inserted on 06/24/23.   Today, she states that she is doing well overall. Her appetite level is at 0%. Her energy level is at 0%. She is accompanied by her husband in person and her  daughter on the phone.  She c/o edema in the bilateral legs. Dr. [name] previously had her on 40 mg of Lasix before she got sick. She is now on 20 mg of Lasix that has not improved swelling. She finishes Vancomycin on 07/04/23.   Her pain is well controlled since her last visit, which used to be 8-9/10 and is now 6/10 or below, currently a 3/10 at this visit. She has been on oxycodone taking 2-3 tablets today. She finishes XRT tomorrow with 5 total sessions.   She has been taking Zofran every 6 hours, 20 minutes before eating, for nausea. Zofran does improve nausea and vomiting. She does not believe she has taken compazine before. She notes the smells of food make her nauseous and vomit. She cannot tolerate the texture of meat. She had an episode of dry heaving this morning. Her husband notes she has a cough producing mucus. She will often attempt to cough up the mucus, but it usually goes back down her throat, making her have long coughing fits. Her husband gives her Mucinex BID.   She has had vaginal bleeding for the past 2 weeks that has been gradually worsening, almost to the point of a regular menstruation. She notes she has blood clots that are small and grainy. She was seen by Dr. Despina Hidden for spotting and was prescribed progesterone. Her spotting resolved then came back 2 weeks ago.   PAST MEDICAL HISTORY:   Past Medical History: Past Medical History:  Diagnosis Date   Adrenal hemorrhage (HCC)    Anemia    Arthritis    Cancer (HCC)    skin cancer - basal   Dyspnea    Edema of both lower extremities    Endometrial polyp    Hirsutism    History of 2019 novel coronavirus disease (COVID-19) 12/21/2020   positive home result documented in pcp note in epic 12-23-2020,  residual doe, pulmonology consult w/ dr wert note in epic 03-20-2021   History of basal cell carcinoma (BCC) excision 2016   forehead, per pt no recurrence   History of CVA (cerebrovascular accident) without residual deficits  1993   pt stated , while on Chloramphenicol, no residuaL; [medication given to treat tick bite] ; patient states  "i stroked out right next to my doctor , he said i passed out and that was my only symptom" ;  no resiudal   History of mouth cancer 2015   per pt surgically removal and cauterized palette , was told cancerous but unknown type, no recurrance  History of palpitations    post covid;  cardiology -- dr c. branch,  work-up results in epic 03/ 2022 (normal nuclear stress test, normal echo, no arrhythmia's per event monitor);   (05-28-2021 per pt no symptoms since 03/ 2022)   Multiple thyroid nodules    endocrinologist--- dr g. nida,  hx benign bx, clinically euthyroid   Osteopenia 12/2017   T score -1.2 FRAX 4.9% / 0.3%   PMB (postmenopausal bleeding)    Pneumonia    x 2   Post-COVID chronic dyspnea    Pre-diabetes    Rosacea    Stroke (HCC)    Thickened endometrium     Surgical History: Past Surgical History:  Procedure Laterality Date   ANTERIOR AND POSTERIOR REPAIR N/A 08/21/2020   Procedure: ANTERIOR (CYSTOCELE) AND POSTERIOR REPAIR (RECTOCELE);  Surgeon: Genia Del, MD;  Location: [facility];  Service: Gynecology;  Laterality: N/A;  requesting 9:00am OR time  requests one hour   ANTERIOR CERVICAL DECOMP/DISCECTOMY FUSION N/A 09/27/2017   Procedure: ANTERIOR CERVICAL DECOMPRESSION/DISCECTOMY FUSION CERVICAL FOUR-FIVE ,CERVICAL FIVE-SIX,CERVICAL SIX-SEVEN;  Surgeon: Donalee Citrin, MD;  Location: [facility] OR;  Service: Neurosurgery;  Laterality: N/A;   AXILLARY LYMPH NODE BIOPSY Right 06/24/2023   Procedure: AXILLARY LYMPH NODE BIOPSY;  Surgeon: Franky Macho, MD;  Location: [facility] OR[facility];  Service: General;  Laterality: Right;   BRONCHIAL BIOPSY  06/14/2023   Procedure: BRONCHIAL BIOPSIES;  Surgeon: Leslye Peer, MD;  Location: [facility] ENDOSCOPY;  Service: Pulmonary;;   BRONCHIAL BRUSHINGS  06/14/2023   Procedure: BRONCHIAL BRUSHINGS;  Surgeon: Leslye Peer, MD;   Location: [facility] ENDOSCOPY;  Service: Pulmonary;;   BRONCHIAL NEEDLE ASPIRATION BIOPSY  06/14/2023   Procedure: BRONCHIAL NEEDLE ASPIRATION BIOPSIES;  Surgeon: Leslye Peer, MD;  Location: [facility] ENDOSCOPY;  Service: Pulmonary;;   CARPAL TUNNEL RELEASE Left 09/27/2017   Procedure: CARPAL TUNNEL RELEASE;  Surgeon: Donalee Citrin, MD;  Location: [facility] OR;  Service: Neurosurgery;  Laterality: Left;   DILATATION & CURETTAGE/HYSTEROSCOPY WITH MYOSURE N/A 06/03/2021   Procedure: DILATATION & CURETTAGE/HYSTEROSCOPY WITH MYOSURE;  Surgeon: Genia Del, MD;  Location: [facility];  Service: Gynecology;  Laterality: N/A;   FOOT SURGERY     bone removed from pinky toe   HEMOSTASIS CONTROL  06/14/2023   Procedure: HEMOSTASIS CONTROL;  Surgeon: Leslye Peer, MD;  Location: [facility] ENDOSCOPY;  Service: Pulmonary;;   INGUINAL HERNIA REPAIR Right 1995   KNEE ARTHROSCOPY W/ MENISCAL REPAIR Left 06/21/2017   dr Netta Corrigan   LEG SURGERY  1972   MOHS SURGERY  2016   [facility] of forehead   MOUTH SURGERY  2015   removal and cauterization pallete of cancerous lesion   PORTACATH PLACEMENT N/A 06/24/2023   Procedure: INSERTION PORT-A-CATH;  Surgeon: Franky Macho, MD;  Location: [facility];  Service: General;  Laterality: N/A;   TONSILLECTOMY  1979   TONSILLECTOMY  1977   TOTAL KNEE ARTHROPLASTY Left 01/26/2018   Procedure: LEFT TOTAL KNEE ARTHROPLASTY;  Surgeon: Ranee Gosselin, MD;  Location: [facility] ORS;  Service: Orthopedics;  Laterality: Left;   TUBAL LIGATION     VIDEO BRONCHOSCOPY WITH ENDOBRONCHIAL ULTRASOUND Bilateral 06/14/2023   Procedure: VIDEO BRONCHOSCOPY WITH ENDOBRONCHIAL ULTRASOUND;  Surgeon: Leslye Peer, MD;  Location: [facility] ENDOSCOPY;  Service: Pulmonary;  Laterality: Bilateral;    Social History: Social History   Socioeconomic History   Marital status: Married    Spouse name: Not on file   Number of children: 3   Years of education: Not on  file   Highest education level: 12th grade   Occupational History   Occupation: retired    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, television/film/video for [employer]   Tobacco Use   Smoking status: Former    Current packs/day: 0.00    Average packs/day: 0.5 packs/day for 35.0 years (17.5 ttl pk-yrs)    Types: Cigarettes    Start date: 11/26/1974    Quit date: 11/26/2009    Years since quitting: 13.6   Smokeless tobacco: Never  Vaping Use   Vaping status: Never Used  Substance and Sexual Activity   Alcohol use: Not Currently   Drug use: No   Sexual activity: Not Currently    Partners: Male    Birth control/protection: Post-menopausal    Comment: 1st intercourse 35 yo-5 partners, married- 25 yrs  Other Topics Concern   Not on file  Social History Narrative   Lives with husband. Raising 2 grandchildren of deceased daughter.       Lives in one story ranch house.    Social Determinants of Health   Financial Resource Strain: Low Risk  (06/09/2023)   Overall Financial Resource Strain (CARDIA)    Difficulty of Paying Living Expenses: Not hard at all  Food Insecurity: No Food Insecurity (06/20/2023)   Hunger Vital Sign    Worried About Running Out of Food in the Last Year: Never true    Ran Out of Food in the Last Year: Never true  Transportation Needs: No Transportation Needs (06/20/2023)   PRAPARE - Administrator, Civil Service (Medical): No    Lack of Transportation (Non-Medical): No  Physical Activity: Inactive (06/09/2023)   Exercise Vital Sign    Days of Exercise per Week: 0 days    Minutes of Exercise per Session: 90 min  Stress: Stress Concern Present (06/09/2023)   [employer] of Occupational Health - Occupational Stress Questionnaire    Feeling of Stress : To some extent  Social Connections: Unknown (06/09/2023)   Social Connection and Isolation Panel [NHANES]    Frequency of Communication with Friends and Family: More than three times a week    Frequency of Social Gatherings with Friends and Family: More than three times a week     Attends Religious Services: Patient declined    Database administrator or Organizations: No    Attends Banker Meetings: Never    Marital Status: Married  Recent Concern: Social Connections - Moderately Isolated (04/27/2023)   Social Connection and Isolation Panel [NHANES]    Frequency of Communication with Friends and Family: More than three times a week    Frequency of Social Gatherings with Friends and Family: Never    Attends Religious Services: Never    Database administrator or Organizations: No    Attends Banker Meetings: Never    Marital Status: Married  Catering manager Violence: Not At Risk (06/20/2023)   Humiliation, Afraid, Rape, and Kick questionnaire    Fear of Current or Ex-Partner: No    Emotionally Abused: No    Physically Abused: No    Sexually Abused: No    Family History: Family History  Problem Relation Age of Onset   Diabetes Mother    Heart Problems Mother    Rheum arthritis Mother    COPD Mother    Polycystic kidney disease Mother    Carpal tunnel syndrome Mother    Cancer Father        Liver Cancer   Liver cancer Father  Carpal tunnel syndrome Sister    Thyroid disease Sister    Rheum arthritis Sister    Lung disease Sister    Diabetes Sister    Carpal tunnel syndrome Sister    Clotting disorder Sister    Rheum arthritis Sister    Thyroid disease Sister    COPD Sister    Heart Problems Sister    Gout Sister    Cancer Brother    Heart Problems Maternal Grandmother    Cancer Maternal Grandfather        Lung Cancer   Heart Problems Maternal Grandfather    Heart Problems Paternal Grandmother    Cancer Paternal Grandfather        Brain & Skin cancer   Polycystic ovary syndrome Daughter    Rheum arthritis Maternal Aunt    Fibromyalgia Maternal Aunt    Polycystic kidney disease Maternal Aunt    Heart Problems Maternal Aunt    Anemia Maternal Aunt    Adrenal disorder Maternal Aunt    Carpal tunnel syndrome Maternal  Aunt    Diabetes Maternal Aunt    Thyroid disease Maternal Aunt    Rheum arthritis Maternal Aunt    Diabetes Maternal Aunt    Diabetes Maternal Aunt    Rheum arthritis Maternal Aunt    Heart Problems Maternal Aunt    Rheum arthritis Maternal Uncle    Heart Problems Maternal Uncle     Current Medications:  Current Outpatient Medications:    folic acid (FOLVITE) 1 MG tablet, Take 1 tablet (1 mg total) by mouth daily., Disp: 90 tablet, Rfl: 3   lidocaine-prilocaine (EMLA) cream, Apply a quarter-sized amount to port a cath site and cover with plastic wrap 1 hour prior to infusion appointments, Disp: 30 g, Rfl: 0   prochlorperazine (COMPAZINE) 10 MG tablet, Take 1 tablet (10 mg total) by mouth every 6 (six) hours as needed for nausea or vomiting., Disp: 60 tablet, Rfl: 2   acetaminophen (TYLENOL) 325 MG tablet, Take 2 tablets (650 mg total) by mouth every 6 (six) hours as needed for mild pain, headache or fever (or Fever >/= 101)., Disp: , Rfl:    albuterol (VENTOLIN HFA) 108 (90 Base) MCG/ACT inhaler, Inhale 2 puffs into the lungs every 6 (six) hours as needed., Disp: 18 g, Rfl: 2   furosemide (LASIX) 20 MG tablet, Take 1 tablet (20 mg total) by mouth daily., Disp: 30 tablet, Rfl: 1   guaiFENesin (MUCINEX) 600 MG 12 hr tablet, Take 2 tablets (1,200 mg total) by mouth 2 (two) times daily., Disp: 20 tablet, Rfl: 0   Multiple Vitamin (MULTIVITAMIN WITH MINERALS) TABS tablet, Take 1 tablet by mouth daily. (Patient not taking: Reported on 07/01/2023), Disp: 100 tablet, Rfl: 1   ondansetron (ZOFRAN) 8 MG tablet, Take 1 tablet (8 mg total) by mouth every 8 (eight) hours as needed for nausea or vomiting., Disp: 30 tablet, Rfl: 3   oxyCODONE (OXY IR/ROXICODONE) 5 MG immediate release tablet, Take 1 tablet (5 mg total) by mouth every 4 (four) hours as needed for moderate pain., Disp: 180 tablet, Rfl: 0   potassium chloride SA (KLOR-CON M) 20 MEQ tablet, Take 1 tablet (20 mEq total) by mouth 2 (two) times  daily for 4 days., Disp: 16 tablet, Rfl: 0   progesterone (PROMETRIUM) 200 MG capsule, Nightly 10 days every 3 months, Disp: 30 capsule, Rfl: 4   promethazine (PHENERGAN) 25 MG suppository, Place 1 suppository (25 mg total) rectally every 6 (six) hours as needed  for nausea or vomiting. (Patient not taking: Reported on 07/01/2023), Disp: 12 each, Rfl: 0   promethazine (PHENERGAN) 25 MG tablet, Take 1 tablet (25 mg total) by mouth every 6 (six) hours as needed for nausea or vomiting. (Patient not taking: Reported on 07/01/2023), Disp: 120 tablet, Rfl: 1   saccharomyces boulardii (FLORASTOR) 250 MG capsule, Take 1 capsule (250 mg total) by mouth 2 (two) times daily., Disp: 30 capsule, Rfl: 0   vancomycin (VANCOCIN) 125 MG capsule, Take 1 capsule (125 mg total) by mouth 4 (four) times daily for 8 days., Disp: 32 capsule, Rfl: 0 No current facility-administered medications for this visit.  Facility-Administered Medications Ordered in Other Visits:    sodium chloride flush (NS) 0.9 % injection 10 mL, 10 mL, Intravenous, PRN, [name], MD, 10 mL at 07/01/23 1616   Allergies: Allergies  Allergen Reactions   Influenza Vaccines Anaphylaxis and Other (See Comments)    Per patient   Penicillins Anaphylaxis and Other (See Comments)   Articaine Other (See Comments)    Caused infection   Cortisone Other (See Comments)    Turned red and ran a low grade fever for 3 days   Other Rash    bandaids- skin turns red and a rash     REVIEW OF SYSTEMS:   Review of Systems  Constitutional:  Positive for chills (intermittent) and fatigue. Negative for fever.  HENT:   Negative for lump/mass, mouth sores, nosebleeds, sore throat and trouble swallowing.   Eyes:  Negative for eye problems.  Respiratory:  Positive for cough and shortness of breath.   Cardiovascular:  Positive for leg swelling. Negative for chest pain and palpitations.  Gastrointestinal:  Positive for diarrhea, nausea and vomiting. Negative  for abdominal pain and constipation.  Genitourinary:  Positive for vaginal bleeding. Negative for bladder incontinence, difficulty urinating, dysuria, frequency, hematuria and nocturia.        +vaginal pain  Musculoskeletal:  Positive for back pain (lower, 3/10 severity). Negative for arthralgias, flank pain, myalgias and neck pain.       +right flank pain, 3/10 severity  Skin:  Negative for itching and rash.  Neurological:  Negative for dizziness, headaches and numbness.  Hematological:  Does not bruise/bleed easily.  Psychiatric/Behavioral:  Positive for sleep disturbance. Negative for depression and suicidal ideas. The patient is not nervous/anxious.   All other systems reviewed and are negative.    VITALS:   There were no vitals taken for this visit.  Wt Readings from Last 3 Encounters:  06/20/23 227 lb 1.2 oz (103 kg)  06/14/23 227 lb (103 kg)  06/11/23 225 lb (102.1 kg)    There is no height or weight on file to calculate BMI.   PHYSICAL EXAM:   Physical Exam Vitals and nursing note reviewed. Exam conducted with a chaperone present.  Constitutional:      Appearance: Normal appearance.  Cardiovascular:     Rate and Rhythm: Normal rate and regular rhythm.     Pulses: Normal pulses.     Heart sounds: Normal heart sounds.  Pulmonary:     Effort: Pulmonary effort is normal.     Breath sounds: Normal breath sounds.  Abdominal:     Palpations: Abdomen is soft. There is no hepatomegaly, splenomegaly or mass.     Tenderness: There is no abdominal tenderness.  Musculoskeletal:     Right lower leg: 2+ Edema present.     Left lower leg: 2+ Edema present.  Lymphadenopathy:     Cervical:  No cervical adenopathy.     Right cervical: No superficial, deep or posterior cervical adenopathy.    Left cervical: No superficial, deep or posterior cervical adenopathy.     Upper Body:     Right upper body: No supraclavicular or axillary adenopathy.     Left upper body: No supraclavicular  or axillary adenopathy.  Neurological:     General: No focal deficit present.     Mental Status: She is alert and oriented to person, place, and time.  Psychiatric:        Mood and Affect: Mood normal.        Behavior: Behavior normal.     LABS:      Latest Ref Rng & Units 07/01/2023    1:05 PM 06/26/2023    1:02 PM 06/25/2023    4:26 AM  CBC  WBC 4.0 - 10.5 K/uL 12.0  11.4  13.4   Hemoglobin 12.0 - 15.0 g/dL 16.1  09.6  04.5   Hematocrit 36.0 - 46.0 % 35.4  32.8  32.5   Platelets 150 - 400 K/uL 481  424  437       Latest Ref Rng & Units 07/01/2023    1:05 PM 06/26/2023    1:02 PM 06/25/2023    4:26 AM  CMP  Glucose 70 - 99 mg/dL 409  811  914   BUN 8 - 23 mg/dL 8  6  7    Creatinine 0.44 - 1.00 mg/dL 7.82  9.56  2.13   Sodium 135 - 145 mmol/L 132  131  132   Potassium 3.5 - 5.1 mmol/L 3.1  3.1  3.1   Chloride 98 - 111 mmol/L 97  97  100   CO2 22 - 32 mmol/L 27  25  23    Calcium 8.9 - 10.3 mg/dL 7.8  7.8  7.9   Total Protein 6.5 - 8.1 g/dL 6.0  5.5  5.6   Total Bilirubin 0.3 - 1.2 mg/dL 0.8  0.4  1.0   Alkaline Phos 38 - 126 U/L 169  123  120   AST 15 - 41 U/L 16  57  50   ALT 0 - 44 U/L 28  66  63      No results found for: "CEA1", "CEA" / No results found for: "CEA1", "CEA" No results found for: "PSA1" No results found for: "YQM578" No results found for: "CAN125"  No results found for: "TOTALPROTELP", "ALBUMINELP", "A1GS", "A2GS", "BETS", "BETA2SER", "GAMS", "MSPIKE", "SPEI" No results found for: "TIBC", "FERRITIN", "IRONPCTSAT" No results found for: "LDH"   STUDIES:   DG Chest Port 1 View  Result Date: 06/24/2023 CLINICAL DATA:  Port-A-Cath placement EXAM: PORTABLE CHEST 1 VIEW COMPARISON:  06/14/2023 FINDINGS: Left Port-A-Cath tip at low SVC 2 superior caval/atrial junction. Right hemidiaphragm elevation.  Cervical spine fixation. Patient rotated left. Midline trachea. Normal heart size. No pleural effusion or pneumothorax. Left perihilar mass measures 6.1 cm  today versus 5.5 cm on 06/14/2023. IMPRESSION: Left Port-A-Cath terminates at the low SVC or superior caval/atrial junction, without pneumothorax. Left perihilar mass measures slightly larger today than on 06/04/2023. Difference is favored to be due to mild obliquity. Electronically Signed   By: Jeronimo Greaves M.D.   On: 06/24/2023 19:10   DG C-Arm 1-60 Min-No Report  Result Date: 06/24/2023 Fluoroscopy was utilized by the requesting physician.  No radiographic interpretation.   DG Abd 1 View  Result Date: 06/21/2023 CLINICAL DATA:  Small bowel obstruction. EXAM: ABDOMEN - 1  VIEW COMPARISON:  June 20, 2023. FINDINGS: No abnormal bowel dilatation is noted. Surgical clips are noted in the pelvis. IMPRESSION: No abnormal bowel dilatation. Electronically Signed   By: Lupita Raider M.D.   On: 06/21/2023 09:53   CT ABDOMEN PELVIS W CONTRAST  Result Date: 06/20/2023 CLINICAL DATA:  Abdominal pain for several weeks. History of lung cancer. * Tracking Code: BO * EXAM: CT ABDOMEN AND PELVIS WITH CONTRAST TECHNIQUE: Multidetector CT imaging of the abdomen and pelvis was performed using the standard protocol following bolus administration of intravenous contrast. RADIATION DOSE REDUCTION: This exam was performed according to the departmental dose-optimization program which includes automated exposure control, adjustment of the mA and/or kV according to patient size and/or use of iterative reconstruction technique. CONTRAST:  OMNIPAQUE IOHEXOL 300 MG/ML  SOLN COMPARISON:  06/11/2023 FINDINGS: Lower chest: Rapidly enlarging mass within the left hilar region is identified. This now measures at least 5.9 x 5.0 cm, image 9/4. On the PET-CT from 05/27/23 this measured 5.7 x 3.5 cm. New small right pleural effusion. Hepatobiliary: There is no suspicious liver abnormality. The gallbladder appears normal. No bile duct dilatation. Pancreas: Unremarkable. Spleen: No focal splenic abnormality. Spleen measures 12.6 cm  cranial caudal Adrenals/Urinary Tract: Large solid and cystic mass centered around the right adrenal gland measures 10.3 by 7.9 by 13.7, image 22/2. On 06/11/2023 10.0 by 7.4 by 13.1 cm. Normal left adrenal gland. The kidneys appear unremarkable. No signs of nephrolithiasis, hydronephrosis or mass. Urinary bladder appears normal. Stomach/Bowel: Stomach appears within normal limits. Progressive distension of the small bowel measure up to 2.9 cm with a few air-fluid levels. The distal small bowel loops have a normal caliber up to the ileocecal valve. No bowel wall thickening or inflammation. Normal caliber of the colon. Vascular/Lymphatic: Aortic atherosclerosis. Aortocaval lymph node measures 1 cm, image 33/2. No pelvic or inguinal adenopathy. Reproductive: Small fibroid arises off the subserosal right uterine corpus measuring 2.1 cm, image 79/2. The endometrial stripe measures 9 mm in thickness which is considered abnormal in a postmenopausal female. No adnexal mass. Other: No significant free fluid or fluid collections. Musculoskeletal: No acute or significant osseous findings. IMPRESSION: 1. Progressive distension of the small bowel measure up to 2.9 cm with a few air-fluid levels. The distal small bowel loops have a normal caliber up to the ileocecal valve. Although nonspecific these findings may reflect developing small bowel ileus or early small-bowel obstruction. Consider serial follow-up radiographs of the abdomen. 2. Enlarging mass within the left hilar region is identified. The visualized portions in the left lower lobe measures 5.9 x 5.0 cm. On the PET-CT from 05/27/23 the perihilar portion measured up to 5.7 x 3.5 cm. 3. 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 On 06/11/2023 10.0 by 7.4 by 13.1 cm. 4. New small right pleural effusion. 5. Endometrial stripe measures 9 mm in thickness which is considered abnormal in a postmenopausal female. Consider further  evaluation with pelvic ultrasound. 6.  Aortic Atherosclerosis (ICD10-I70.0). Electronically Signed   By: Signa Kell M.D.   On: 06/20/2023 17:50   MR Brain W Wo Contrast  Result Date: 06/16/2023 CLINICAL DATA:  Provided history: Malignant neoplasm of unspecified part of unspecified bronchus or lung. Non-small cell lung cancer, staging. EXAM: MRI HEAD WITHOUT AND WITH CONTRAST TECHNIQUE: Multiplanar, multiecho pulse sequences of the brain and surrounding structures were obtained without and with intravenous contrast. CONTRAST:  10mL GADAVIST GADOBUTROL 1 MMOL/ML IV SOLN COMPARISON:  Head CT 04/09/2016. FINDINGS: Brain: Cerebral volume is normal. 2 mm focus of enhancement along the right central sulcus which is indeterminate for a small cortical metastasis versus vascular enhancement (series 16, image 34). Small T2 hyperintense foci within the left thalamus and left lentiform nucleus, which may reflect prominent perivascular spaces or chronic lacunar infarcts. There are a few small foci of T2 FLAIR hyperintense signal abnormality within the cerebral white matter, nonspecific but compatible with minimal changes of chronic small vessel ischemia. There is no acute infarct. No chronic intracranial blood products. No extra-axial fluid collection. No midline shift. Vascular: Maintained flow voids within the proximal large arterial vessels. Small developmental venous anomaly within the anterior right frontal lobe (anatomic variant). Skull and upper cervical spine: No focal suspicious marrow lesion. Susceptibility artifact arising from ACDF hardware. Sinuses/Orbits: No mass or acute finding within the imaged orbits. No significant paranasal sinus disease. Impression #1 will be called to the ordering clinician or representative by the Radiologist Assistant, and communication documented in the PACS or [employer]. IMPRESSION: 1. 2 mm focus of enhancement along the right central sulcus, which is indeterminate for a  small cortical metastasis versus vascular enhancement. A short-interval follow-up brain MRI (with and without contrast) is recommended in 6-8 weeks. 2. Small T2 hyperintense foci within the deep gray nuclei on the left, which may reflect prominent perivascular spaces or chronic lacunar infarcts. 3. Minimal chronic small vessel ischemic changes within the cerebral white matter. Electronically Signed   By: Jackey Loge D.O.   On: 06/16/2023 17:40   DG Chest Port 1 View  Result Date: 06/14/2023 CLINICAL DATA:  Status post bronchoscopy and biopsy EXAM: PORTABLE CHEST 1 VIEW COMPARISON:  05/07/2023 FINDINGS: Normal heart size. Left hilar mass. No acute abnormality of the lungs following bronchoscopy. The visualized skeletal structures are unremarkable. IMPRESSION: No acute abnormality of the lungs status post bronchoscopy. Left hilar mass. Electronically Signed   By: Jearld Lesch M.D.   On: 06/14/2023 13:11   DG C-ARM BRONCHOSCOPY  Result Date: 06/14/2023 C-ARM BRONCHOSCOPY: Fluoroscopy was utilized by the requesting physician.  No radiographic interpretation.   CT ABDOMEN PELVIS W CONTRAST  Result Date: 06/11/2023 CLINICAL DATA:  62 year old female with history of abdominal pain. Nausea and vomiting for the past 6 weeks. Additional history of lung cancer. * Tracking Code: BO * EXAM: CT ABDOMEN AND PELVIS WITH CONTRAST TECHNIQUE: Multidetector CT imaging of the abdomen and pelvis was performed using the standard protocol following bolus administration of intravenous contrast. RADIATION DOSE REDUCTION: This exam was performed according to the departmental dose-optimization program which includes automated exposure control, adjustment of the mA and/or kV according to patient size and/or use of iterative reconstruction technique. CONTRAST:  75mL OMNIPAQUE IOHEXOL 350 MG/ML SOLN COMPARISON:  PET-CT 05/27/2023. CT of the abdomen and pelvis 05/20/2023. FINDINGS: Lower chest: Trace right pleural effusion lying  dependently. Hepatobiliary: No definite suspicious cystic or solid hepatic lesions. No intra or extrahepatic biliary ductal dilatation. Gallbladder is unremarkable in appearance. Pancreas: No pancreatic mass. No pancreatic ductal dilatation. No pancreatic or peripancreatic fluid collections or inflammatory changes. Spleen: Unremarkable. Adrenals/Urinary Tract: Large heterogeneous appearing mixed cystic and solid right adrenal mass (axial image 25 of series 3 and coronal image 64 of series 6) which currently measures 10.6 x 7.2 x 14.6 cm, which exerts mass effect upon adjacent structures displacing the right lobe of the liver anteriorly and superiorly, and making broad contact with the upper pole of the right kidney which is mildly distorted by  the adrenal mass. Left kidney and left adrenal gland are normal in appearance. No hydroureteronephrosis. Urinary bladder is unremarkable in appearance. Stomach/Bowel: The appearance of the stomach is normal. There is no pathologic dilatation of small bowel or colon. Numerous colonic diverticuli are noted, without surrounding inflammatory changes to suggest an acute diverticulitis at this time. The appendix is not confidently identified and may be surgically absent. Regardless, there are no inflammatory changes noted adjacent to the cecum to suggest the presence of an acute appendicitis at this time. Vascular/Lymphatic: Atherosclerosis in the abdominal aorta and pelvic vasculature. No lymphadenopathy noted in the abdomen or pelvis. Reproductive: Tubal ligation clips are noted bilaterally. Uterus and ovaries are otherwise unremarkable in appearance. Other: No significant volume of ascites.  No pneumoperitoneum. Musculoskeletal: There are no aggressive appearing lytic or blastic lesions noted in the visualized portions of the skeleton. IMPRESSION: 1. No definite acute findings are noted in the abdomen or pelvis. 2. Large right adrenal metastasis redemonstrated, slightly increased  in size compared to prior studies, most compatible with a metastatic lesion. 3. Trace right pleural effusion lying dependently. 4. Aortic atherosclerosis. 5. Additional incidental findings, as above. Electronically Signed   By: Trudie Reed M.D.   On: 06/11/2023 05:20

## 2023-07-01 NOTE — Patient Instructions (Addendum)
[facility] at [facility] Discharge Instructions   You were seen and examined today by Dr. Ellin Saba.  He discussed with you the results of your biopsy. 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 This is considered a Stage IV disease. This makes this cancer incurable. But we can definitely treat this and control the cancer and alleviate the symptoms you may be having related to the cancer.   He discussed initiating treatment for this cancer with a regimen consisting of two chemotherapy drugs. Those drugs are carboplatin and pemetrexed (Alimta). Depending on the results of your special testing we sent on your biopsy, we may add two immunotherapy drugs to this treatment regimen. Those drugs are durvalumab (Imfinzi) and tremilimumab (Imjudo). Treatment is given every 3 weeks for four cycles, then treatment is given every 4 weeks.   We will plan to start your treatment next week. You will come for an education session with Revonda Standard, our chemotherapy educator, prior to the start of treatment.   Return as scheduled.      Thank you for choosing  [facility] Center at [facility] to provide your oncology and hematology care.  To afford each patient quality time with our provider, please arrive at least 15 minutes before your scheduled appointment time.   If you have a lab appointment with the Cancer Center please come in thru the Main Entrance and check in at the main information desk.  You need to re-schedule your appointment should you arrive 10 or more minutes late.  We strive to give you quality time with our providers, and arriving late affects you and other patients whose appointments are after yours.  Also, if you no show three or more times for appointments you may be dismissed from the clinic at the providers discretion.     Again, thank you for  choosing [facility].  Our hope is that these requests will decrease the amount of time that you wait before being seen by our physicians.       _____________________________________________________________  Should you have questions after your visit to [facility], please contact our office at [phone] and follow the prompts.  Our office hours are 8:00 a.m. and 4:30 p.m. Monday - Friday.  Please note that voicemails left after 4:00 p.m. may not be returned until the following business day.  We are closed weekends and major holidays.  You do have access to a nurse 24-7, just call the main number to the clinic [phone] and do not press any options, hold on the line and a nurse will answer the phone.    For prescription refill requests, have your pharmacy contact our office and allow 72 hours.    Due to Covid, you will need to wear a mask upon entering the hospital. If you do not have a mask, a mask will be given to you at the Main Entrance upon arrival. For doctor visits, patients may have 1 support person age 64 or older with them. For treatment visits, patients can not have anyone with them due to social distancing guidelines and our immunocompromised population.

## 2023-07-01 NOTE — Patient Instructions (Signed)

## 2023-07-02 ENCOUNTER — Telehealth: Payer: Self-pay

## 2023-07-02 ENCOUNTER — Encounter: Admitting: Primary Care

## 2023-07-02 ENCOUNTER — Encounter: Payer: Self-pay | Admitting: Hematology

## 2023-07-02 ENCOUNTER — Ambulatory Visit: Admitting: Adult Health

## 2023-07-02 ENCOUNTER — Encounter: Payer: Self-pay | Admitting: Family Medicine

## 2023-07-02 NOTE — Telephone Encounter (Signed)
Can you check in on this?

## 2023-07-02 NOTE — Progress Notes (Signed)
The proposed treatment discussed in conference is for discussion purpose only and is not a binding recommendation.  The patients have not been physically examined, or presented with their treatment options.  Therefore, final treatment plans cannot be decided.  

## 2023-07-02 NOTE — Telephone Encounter (Signed)
Left message for patient asking her to call the office back to let us know if she needs oxycodone refilled.  Per [facility] patient has not been evaluated by them yet. They will verify that and call our office back to confirm.  Dr. Nadine Counts has been made aware of this.

## 2023-07-02 NOTE — Telephone Encounter (Signed)
Per [facility], pt has an appt with them on 9/9. Nurse states that they will not be prescribing pt oxycodone.  Dr. Nadine Counts has been made aware.  Dr. Nadine Counts has sent mychart message asking pt to call our office to schedule at least a virtual visit if pt needs the pain medication.

## 2023-07-02 NOTE — Progress Notes (Signed)
 This encounter was created in error - please disregard.

## 2023-07-02 NOTE — Telephone Encounter (Addendum)
The patient was scheduled to have a Virtual Visit for a follow up from her Bronchoscopy on 06/14/2023. When I spoke with the patient to get her ready for her appointment she said she saw Oncology yesterday and had already reviewed her results. She said she is not having any problems since the Bronchoscopy and did not feel like she really needed this appointment. I told her I would get the appointment canceled.  Appt has been canceled and Ames Dura, NP is aware. Nothing further needed.

## 2023-07-02 NOTE — Telephone Encounter (Signed)
Left message with [facility] to return call.  Will they manage oxycodone?  If not, Dr. Nadine Counts request that pt make a virtual visit to discuss refills and at that time the Rx can be filled.

## 2023-07-06 ENCOUNTER — Other Ambulatory Visit: Payer: Self-pay

## 2023-07-06 ENCOUNTER — Emergency Department (HOSPITAL_COMMUNITY)

## 2023-07-06 ENCOUNTER — Inpatient Hospital Stay (HOSPITAL_COMMUNITY)
Admission: EM | Admit: 2023-07-06 | Discharge: 2023-07-11 | DRG: 871 | Disposition: A | Discharge status: Home / Self Care | Attending: Internal Medicine | Admitting: Internal Medicine

## 2023-07-06 ENCOUNTER — Telehealth: Payer: Self-pay | Admitting: *Deleted

## 2023-07-06 ENCOUNTER — Encounter (HOSPITAL_COMMUNITY): Payer: Self-pay | Admitting: *Deleted

## 2023-07-06 DIAGNOSIS — E8809 Other disorders of plasma-protein metabolism, not elsewhere classified: Secondary | ICD-10-CM | POA: Diagnosis present

## 2023-07-06 DIAGNOSIS — C3432 Malignant neoplasm of lower lobe, left bronchus or lung: Secondary | ICD-10-CM | POA: Diagnosis present

## 2023-07-06 DIAGNOSIS — J188 Other pneumonia, unspecified organism: Secondary | ICD-10-CM | POA: Diagnosis present

## 2023-07-06 DIAGNOSIS — R600 Localized edema: Secondary | ICD-10-CM | POA: Diagnosis present

## 2023-07-06 DIAGNOSIS — E869 Volume depletion, unspecified: Secondary | ICD-10-CM | POA: Diagnosis present

## 2023-07-06 DIAGNOSIS — Z923 Personal history of irradiation: Secondary | ICD-10-CM

## 2023-07-06 DIAGNOSIS — C3492 Malignant neoplasm of unspecified part of left bronchus or lung: Secondary | ICD-10-CM | POA: Diagnosis present

## 2023-07-06 DIAGNOSIS — C7971 Secondary malignant neoplasm of right adrenal gland: Secondary | ICD-10-CM | POA: Diagnosis present

## 2023-07-06 DIAGNOSIS — R159 Full incontinence of feces: Secondary | ICD-10-CM | POA: Diagnosis present

## 2023-07-06 DIAGNOSIS — Z85828 Personal history of other malignant neoplasm of skin: Secondary | ICD-10-CM | POA: Diagnosis not present

## 2023-07-06 DIAGNOSIS — Z833 Family history of diabetes mellitus: Secondary | ICD-10-CM

## 2023-07-06 DIAGNOSIS — R0602 Shortness of breath: Secondary | ICD-10-CM | POA: Diagnosis present

## 2023-07-06 DIAGNOSIS — K117 Disturbances of salivary secretion: Secondary | ICD-10-CM | POA: Diagnosis present

## 2023-07-06 DIAGNOSIS — R6520 Severe sepsis without septic shock: Secondary | ICD-10-CM | POA: Diagnosis present

## 2023-07-06 DIAGNOSIS — Z832 Family history of diseases of the blood and blood-forming organs and certain disorders involving the immune mechanism: Secondary | ICD-10-CM

## 2023-07-06 DIAGNOSIS — E872 Acidosis, unspecified: Secondary | ICD-10-CM | POA: Diagnosis present

## 2023-07-06 DIAGNOSIS — A419 Sepsis, unspecified organism: Secondary | ICD-10-CM | POA: Diagnosis not present

## 2023-07-06 DIAGNOSIS — Z8271 Family history of polycystic kidney: Secondary | ICD-10-CM

## 2023-07-06 DIAGNOSIS — Z96652 Presence of left artificial knee joint: Secondary | ICD-10-CM | POA: Diagnosis present

## 2023-07-06 DIAGNOSIS — Z801 Family history of malignant neoplasm of trachea, bronchus and lung: Secondary | ICD-10-CM

## 2023-07-06 DIAGNOSIS — Z884 Allergy status to anesthetic agent status: Secondary | ICD-10-CM | POA: Diagnosis not present

## 2023-07-06 DIAGNOSIS — Z8261 Family history of arthritis: Secondary | ICD-10-CM

## 2023-07-06 DIAGNOSIS — Z87891 Personal history of nicotine dependence: Secondary | ICD-10-CM

## 2023-07-06 DIAGNOSIS — Z79899 Other long term (current) drug therapy: Secondary | ICD-10-CM | POA: Diagnosis not present

## 2023-07-06 DIAGNOSIS — E668 Other obesity: Secondary | ICD-10-CM | POA: Diagnosis present

## 2023-07-06 DIAGNOSIS — C772 Secondary and unspecified malignant neoplasm of intra-abdominal lymph nodes: Secondary | ICD-10-CM | POA: Diagnosis present

## 2023-07-06 DIAGNOSIS — Z8349 Family history of other endocrine, nutritional and metabolic diseases: Secondary | ICD-10-CM

## 2023-07-06 DIAGNOSIS — R9389 Abnormal findings on diagnostic imaging of other specified body structures: Secondary | ICD-10-CM | POA: Diagnosis present

## 2023-07-06 DIAGNOSIS — Z808 Family history of malignant neoplasm of other organs or systems: Secondary | ICD-10-CM

## 2023-07-06 DIAGNOSIS — Z880 Allergy status to penicillin: Secondary | ICD-10-CM | POA: Diagnosis not present

## 2023-07-06 DIAGNOSIS — J189 Pneumonia, unspecified organism: Secondary | ICD-10-CM | POA: Diagnosis not present

## 2023-07-06 DIAGNOSIS — M8580 Other specified disorders of bone density and structure, unspecified site: Secondary | ICD-10-CM | POA: Diagnosis present

## 2023-07-06 DIAGNOSIS — Z825 Family history of asthma and other chronic lower respiratory diseases: Secondary | ICD-10-CM

## 2023-07-06 DIAGNOSIS — Z8616 Personal history of COVID-19: Secondary | ICD-10-CM | POA: Diagnosis not present

## 2023-07-06 DIAGNOSIS — Z887 Allergy status to serum and vaccine status: Secondary | ICD-10-CM

## 2023-07-06 DIAGNOSIS — R042 Hemoptysis: Secondary | ICD-10-CM | POA: Diagnosis present

## 2023-07-06 DIAGNOSIS — M7989 Other specified soft tissue disorders: Secondary | ICD-10-CM | POA: Diagnosis present

## 2023-07-06 DIAGNOSIS — Z6838 Body mass index (BMI) 38.0-38.9, adult: Secondary | ICD-10-CM

## 2023-07-06 DIAGNOSIS — Z8619 Personal history of other infectious and parasitic diseases: Secondary | ICD-10-CM

## 2023-07-06 DIAGNOSIS — J181 Lobar pneumonia, unspecified organism: Secondary | ICD-10-CM | POA: Diagnosis not present

## 2023-07-06 DIAGNOSIS — Z1152 Encounter for screening for COVID-19: Secondary | ICD-10-CM

## 2023-07-06 DIAGNOSIS — Z800 Family history of malignant neoplasm of digestive organs: Secondary | ICD-10-CM

## 2023-07-06 DIAGNOSIS — E876 Hypokalemia: Secondary | ICD-10-CM | POA: Diagnosis not present

## 2023-07-06 DIAGNOSIS — A0472 Enterocolitis due to Clostridium difficile, not specified as recurrent: Secondary | ICD-10-CM | POA: Diagnosis present

## 2023-07-06 DIAGNOSIS — Z8673 Personal history of transient ischemic attack (TIA), and cerebral infarction without residual deficits: Secondary | ICD-10-CM

## 2023-07-06 DIAGNOSIS — Z85819 Personal history of malignant neoplasm of unspecified site of lip, oral cavity, and pharynx: Secondary | ICD-10-CM

## 2023-07-06 DIAGNOSIS — Z981 Arthrodesis status: Secondary | ICD-10-CM

## 2023-07-06 LAB — CBC WITH DIFFERENTIAL/PLATELET
Abs Immature Granulocytes: 0.1 10*3/uL — ABNORMAL HIGH (ref 0.00–0.07)
Basophils Absolute: 0 10*3/uL (ref 0.0–0.1)
Basophils Relative: 0 %
Eosinophils Absolute: 0.1 10*3/uL (ref 0.0–0.5)
Eosinophils Relative: 1 %
HCT: 37.7 % (ref 36.0–46.0)
Hemoglobin: 11.7 g/dL — ABNORMAL LOW (ref 12.0–15.0)
Immature Granulocytes: 1 %
Lymphocytes Relative: 4 %
Lymphs Abs: 0.4 10*3/uL — ABNORMAL LOW (ref 0.7–4.0)
MCH: 25.2 pg — ABNORMAL LOW (ref 26.0–34.0)
MCHC: 31 g/dL (ref 30.0–36.0)
MCV: 81.1 fL (ref 80.0–100.0)
Monocytes Absolute: 0.7 10*3/uL (ref 0.1–1.0)
Monocytes Relative: 6 %
Neutro Abs: 10.9 10*3/uL — ABNORMAL HIGH (ref 1.7–7.7)
Neutrophils Relative %: 88 %
Platelets: 527 10*3/uL — ABNORMAL HIGH (ref 150–400)
RBC: 4.65 MIL/uL (ref 3.87–5.11)
RDW: 16.4 % — ABNORMAL HIGH (ref 11.5–15.5)
WBC: 12.2 10*3/uL — ABNORMAL HIGH (ref 4.0–10.5)
nRBC: 0 % (ref 0.0–0.2)

## 2023-07-06 LAB — COMPREHENSIVE METABOLIC PANEL
ALT: 53 U/L — ABNORMAL HIGH (ref 0–44)
AST: 66 U/L — ABNORMAL HIGH (ref 15–41)
Albumin: 2.3 g/dL — ABNORMAL LOW (ref 3.5–5.0)
Alkaline Phosphatase: 168 U/L — ABNORMAL HIGH (ref 38–126)
Anion gap: 12 (ref 5–15)
BUN: 14 mg/dL (ref 8–23)
CO2: 24 mmol/L (ref 22–32)
Calcium: 8.2 mg/dL — ABNORMAL LOW (ref 8.9–10.3)
Chloride: 97 mmol/L — ABNORMAL LOW (ref 98–111)
Creatinine, Ser: 0.75 mg/dL (ref 0.44–1.00)
GFR, Estimated: >60 mL/min (ref >60)
Glucose, Bld: 166 mg/dL — ABNORMAL HIGH (ref 70–99)
Potassium: 3.5 mmol/L (ref 3.5–5.1)
Sodium: 133 mmol/L — ABNORMAL LOW (ref 135–145)
Total Bilirubin: 0.8 mg/dL (ref 0.3–1.2)
Total Protein: 6.3 g/dL — ABNORMAL LOW (ref 6.5–8.1)

## 2023-07-06 LAB — APTT: aPTT: 24 s (ref 24–36)

## 2023-07-06 LAB — PROTIME-INR
INR: 1.3 — ABNORMAL HIGH (ref 0.8–1.2)
Prothrombin Time: 16 s — ABNORMAL HIGH (ref 11.4–15.2)

## 2023-07-06 LAB — BRAIN NATRIURETIC PEPTIDE: B Natriuretic Peptide: 47 pg/mL (ref 0.0–100.0)

## 2023-07-06 LAB — LACTIC ACID, PLASMA
Lactic Acid, Venous: 2.2 mmol/L (ref 0.5–1.9)
Lactic Acid, Venous: 1.4 mmol/L (ref 0.5–1.9)

## 2023-07-06 LAB — SARS CORONAVIRUS 2 BY RT PCR: SARS Coronavirus 2 by RT PCR: NEGATIVE

## 2023-07-06 LAB — MAGNESIUM: Magnesium: 2 mg/dL (ref 1.7–2.4)

## 2023-07-06 MED ORDER — SACCHAROMYCES BOULARDII 250 MG PO CAPS
250.0000 mg | ORAL_CAPSULE | Freq: Two times a day (BID) | ORAL | Status: DC
  Administered 2023-07-06 – 2023-07-11 (×10): 250 mg via ORAL
  Filled 2023-07-06 (×10): qty 1

## 2023-07-06 MED ORDER — ALPRAZOLAM 0.5 MG PO TABS
0.5000 mg | ORAL_TABLET | Freq: Every day | ORAL | Status: AC
  Administered 2023-07-06: 0.5 mg via ORAL
  Filled 2023-07-06: qty 1

## 2023-07-06 MED ORDER — ENOXAPARIN SODIUM 60 MG/0.6ML IJ SOSY
50.0000 mg | PREFILLED_SYRINGE | INTRAMUSCULAR | Status: DC
  Administered 2023-07-06 – 2023-07-10 (×5): 50 mg via SUBCUTANEOUS
  Filled 2023-07-06 (×5): qty 0.6

## 2023-07-06 MED ORDER — GUAIFENESIN-DM 100-10 MG/5ML PO SYRP
15.0000 mL | ORAL_SOLUTION | Freq: Three times a day (TID) | ORAL | Status: AC
  Administered 2023-07-06 – 2023-07-07 (×3): 15 mL via ORAL
  Filled 2023-07-06 (×3): qty 15

## 2023-07-06 MED ORDER — SODIUM CHLORIDE 0.9 % IV BOLUS
500.0000 mL | Freq: Once | INTRAVENOUS | Status: AC
  Administered 2023-07-06: 500 mL via INTRAVENOUS

## 2023-07-06 MED ORDER — ACETAMINOPHEN 325 MG PO TABS
650.0000 mg | ORAL_TABLET | Freq: Four times a day (QID) | ORAL | Status: DC | PRN
  Administered 2023-07-07: 650 mg via ORAL
  Filled 2023-07-06: qty 2

## 2023-07-06 MED ORDER — OXYCODONE HCL 5 MG PO TABS
5.0000 mg | ORAL_TABLET | ORAL | Status: DC | PRN
  Administered 2023-07-07 – 2023-07-09 (×7): 5 mg via ORAL
  Filled 2023-07-06 (×7): qty 1

## 2023-07-06 MED ORDER — IOHEXOL 350 MG/ML SOLN
100.0000 mL | Freq: Once | INTRAVENOUS | Status: AC | PRN
  Administered 2023-07-06: 100 mL via INTRAVENOUS

## 2023-07-06 MED ORDER — ACETAMINOPHEN 500 MG PO TABS
1000.0000 mg | ORAL_TABLET | Freq: Once | ORAL | Status: AC
  Administered 2023-07-06: 1000 mg via ORAL
  Filled 2023-07-06: qty 2

## 2023-07-06 MED ORDER — METRONIDAZOLE 500 MG/100ML IV SOLN
500.0000 mg | Freq: Once | INTRAVENOUS | Status: AC
  Administered 2023-07-06: 500 mg via INTRAVENOUS
  Filled 2023-07-06: qty 100

## 2023-07-06 MED ORDER — SODIUM CHLORIDE 0.9 % IV SOLN
1.0000 g | Freq: Three times a day (TID) | INTRAVENOUS | Status: DC
  Filled 2023-07-06 (×2): qty 5

## 2023-07-06 MED ORDER — VANCOMYCIN HCL IN DEXTROSE 1-5 GM/200ML-% IV SOLN: 1000.0000 mg | Freq: Once | INTRAVENOUS | Status: DC

## 2023-07-06 MED ORDER — VANCOMYCIN HCL 1500 MG/300ML IV SOLN
1500.0000 mg | INTRAVENOUS | Status: DC
  Administered 2023-07-07: 1500 mg via INTRAVENOUS
  Filled 2023-07-06: qty 300

## 2023-07-06 MED ORDER — ACETAMINOPHEN 650 MG RE SUPP: 650.0000 mg | Freq: Four times a day (QID) | RECTAL | Status: DC | PRN

## 2023-07-06 MED ORDER — PROGESTERONE 200 MG PO CAPS
200.0000 mg | ORAL_CAPSULE | Freq: Every day | ORAL | Status: DC
  Administered 2023-07-07 – 2023-07-10 (×4): 200 mg via ORAL
  Filled 2023-07-06 (×6): qty 1

## 2023-07-06 MED ORDER — SODIUM CHLORIDE 0.9 % IV SOLN
2.0000 g | Freq: Once | INTRAVENOUS | Status: AC
  Administered 2023-07-06: 2 g via INTRAVENOUS
  Filled 2023-07-06: qty 10

## 2023-07-06 MED ORDER — VANCOMYCIN HCL 2000 MG/400ML IV SOLN
2000.0000 mg | Freq: Once | INTRAVENOUS | Status: AC
  Administered 2023-07-06: 2000 mg via INTRAVENOUS
  Filled 2023-07-06: qty 400

## 2023-07-06 MED ORDER — POLYETHYLENE GLYCOL 3350 17 G PO PACK: 17.0000 g | PACK | Freq: Every day | ORAL | Status: DC | PRN

## 2023-07-06 MED ORDER — METRONIDAZOLE 500 MG/100ML IV SOLN
500.0000 mg | Freq: Two times a day (BID) | INTRAVENOUS | Status: DC
  Administered 2023-07-07 – 2023-07-11 (×9): 500 mg via INTRAVENOUS
  Filled 2023-07-06 (×9): qty 100

## 2023-07-06 MED ORDER — SODIUM CHLORIDE 0.9 % IV SOLN
2.0000 g | Freq: Three times a day (TID) | INTRAVENOUS | Status: DC
  Administered 2023-07-06 – 2023-07-07 (×2): 2 g via INTRAVENOUS
  Filled 2023-07-06 (×7): qty 10

## 2023-07-06 MED ORDER — PROCHLORPERAZINE MALEATE 5 MG PO TABS
10.0000 mg | ORAL_TABLET | Freq: Four times a day (QID) | ORAL | Status: DC | PRN
  Administered 2023-07-08 – 2023-07-11 (×9): 10 mg via ORAL
  Filled 2023-07-06 (×9): qty 2

## 2023-07-06 MED ORDER — POTASSIUM CHLORIDE CRYS ER 20 MEQ PO TBCR
40.0000 meq | EXTENDED_RELEASE_TABLET | Freq: Once | ORAL | Status: AC
  Administered 2023-07-06: 40 meq via ORAL
  Filled 2023-07-06: qty 2

## 2023-07-06 NOTE — Telephone Encounter (Signed)
Patient called and has severe edema in lower extremities associated with pain.  She is extremely weak and unable to complete her ADLS.  I have sent her to the ER, as she is also having difficulty with nutrition.  Verbalized understanding.

## 2023-07-06 NOTE — ED Triage Notes (Addendum)
Pt with SOB increasing worse with edema.  Pt with lung CA.  Also having urine incontinence which is new per pt.  Denies any burning with urination.

## 2023-07-06 NOTE — Assessment & Plan Note (Addendum)
2+ swelling to bilateral lower extremities.  Last echo 12/2020, EF of 65 to 70%.  BNP unremarkable at 47. -Obtain echo -Bilateral lower extremity venous Dopplers -Hold home Lasix 40 mg daily for now -Hold off on diuresis at this time with severe sepsis

## 2023-07-06 NOTE — Sepsis Progress Note (Signed)
Sepsis protocol monitored by eLink ?

## 2023-07-06 NOTE — ED Provider Notes (Signed)
  Physical Exam  BP 102/67   Pulse (!) 134   Temp (!) 102.2 F (39 C) (Rectal)   Resp 18   Ht 5\' 6"  (1.676 m)   Wt 103 kg   SpO2 94%   BMI 36.64 kg/m   Physical Exam  Procedures  Procedures  ED Course / MDM    Medical Decision Making Amount and/or Complexity of Data Reviewed Labs: ordered. Radiology: ordered. ECG/medicine tests: ordered.  Risk OTC drugs. Prescription drug management.   Miranda Perkins, assumed care for this patient.  In brief this is a 62 year old female with a history of lung cancer not currently receiving chemotherapy who presented to the emergency department today due to fever and lower extremity swelling.  Patient was signed out pending imaging.  CT imaging did show a pneumonia, no PE.  Believe this is likely the driving factor of the patient's fever, probably contributing to her lower extremity edema.  Will admit patient to hospitalist.  Patient's heart rate now down in the low 1 teens.  Hemodynamically stable.        Anders Simmonds T, DO 07/06/23 1731

## 2023-07-06 NOTE — Progress Notes (Addendum)
Pharmacy Antibiotic Note  Miranda Perkins is a 62 y.o. female admitted on 07/06/2023 with sepsis.  Pharmacy has been consulted for Azactam and Vancomycin dosing.  Plan: Vancomycin 2000 mg IV loading dose then 1500mg  IV Q 24 hrs. Goal AUC 400-550. Expected AUC: 489 SCr used: 0.8( actual 0.75) Aztreonam 2gm IV q8h F/U cxs and clinical progress Monitor V/S, labs and levels as indicate    Height: 5\' 6"  (167.6 cm) Weight: 103 kg (227 lb) IBW/kg (Calculated) : 59.3  Temp (24hrs), Avg:100.6 F (38.1 C), Min:99 F (37.2 C), Max:102.2 F (39 C)  Recent Labs  Lab 07/01/23 1305 07/06/23 1305  WBC 12.0* 12.2*  CREATININE 0.60 0.75    Estimated Creatinine Clearance: 86.1 mL/min (by C-G formula based on SCr of 0.75 mg/dL).    Allergies  Allergen Reactions   Influenza Vaccines Anaphylaxis and Other (See Comments)    Per patient   Penicillins Anaphylaxis and Other (See Comments)   Articaine Other (See Comments)    Caused infection   Cortisone Other (See Comments)    Turned red and ran a low grade fever for 3 days   Other Rash    bandaids- skin turns red and a rash     Antimicrobials this admission: Vancomycin 9/3 >>  Aztreonam 9/3 >>   Microbiology results: 9/3 BCx: pending  MRSA PCR:   Thank you for allowing pharmacy to be a part of this patient's care.  Elder Cyphers, BS Pharm D, BCPS Clinical Pharmacist 07/06/2023 2:39 PM

## 2023-07-06 NOTE — ED Notes (Signed)
Patient transported to CT 

## 2023-07-06 NOTE — H&P (Signed)
History and Physical    Miranda Perkins:[id number] DOB: [birth year] DOA: 07/06/2023  PCP: [name], DO   Patient coming from: Home  I have personally briefly reviewed patient's old medical records in [facility]  Chief Complaint: Weakness, SOB  HPI: Miranda Perkins is a 62 y.o. female with medical history significant for lung cancer, CVA, adrenal hemorrhage. Patient presented to the ED with complaints of increasing difficulty breathing, with bilateral lower extremity swelling.  Reports symptoms started a few days after discharge from hospital.  Patient was recently hospitalized 8/18 to 8/24 for persistent diarrhea from C. difficile infection after she had completed 10 days of Dificid.  On discharge symptoms solved, and she was discharged home to complete 10 days of oral vancomycin.  She reports lower extremity swelling on discharge, but since getting home this has significantly worsened, her dose of Lasix was increased from 20 back to 40 mg daily which she has been compliant with but swelling has not improved. She reports weight of 220lbs on getting on from the hospital, has not checked since.  No chest pain.  ED Course: Tmax of 102.2, initial tachycardia to 135 improved after fluids now low 110s.  Respirate rate 18-22.  Blood pressure systolic 98-115.  O2 sats 93 to 95% on room air.  WBC 12.  Lactic acid 2.2 > 1.4.  COVID test negative. CTA chest negative for PE,  Interval increase in size of left lung lower lobe mass with resultant postobstructive pneumonia versus atelectasis of the left lung lower lobe. Increasing right pleural effusion. Vancomycin, aztreonam and metronidazole started. N/s 500 bolus given.  Review of Systems: As per HPI all other systems reviewed and negative.  Past Medical History:  Diagnosis Date   Adrenal hemorrhage (HCC)    Anemia    Arthritis    Cancer (HCC)    skin cancer - basal   Dyspnea    Edema of both lower extremities     Endometrial polyp    Hirsutism    History of 2019 novel coronavirus disease (COVID-19) 12/21/2020   positive home result documented in pcp note in epic 12-23-2020,  residual doe, pulmonology consult w/ dr wert note in epic 03-20-2021   History of basal cell carcinoma (BCC) excision 2016   forehead, per pt no recurrence   History of CVA (cerebrovascular accident) without residual deficits 1993   pt stated , while on Chloramphenicol, no residuaL; [medication given to treat tick bite] ; patient states  "i stroked out right next to my doctor , he said i passed out and that was my only symptom" ;  no resiudal   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 branch,  work-up results in epic 03/ 2022 (normal nuclear stress test, normal echo, no arrhythmia's per event monitor);   (05-28-2021 per pt no symptoms since 03/ 2022)   Multiple thyroid nodules    endocrinologist--- dr g. nida,  hx benign bx, clinically euthyroid   Osteopenia 12/2017   T score -1.2 FRAX 4.9% / 0.3%   PMB (postmenopausal bleeding)    Pneumonia    x 2   Post-COVID chronic dyspnea    Pre-diabetes    Rosacea    Stroke (HCC)    Thickened endometrium     Past Surgical History:  Procedure Laterality Date  ANTERIOR AND POSTERIOR REPAIR N/A 08/21/2020   Procedure: ANTERIOR (CYSTOCELE) AND POSTERIOR REPAIR (RECTOCELE);  Surgeon: Genia Del, MD;  Location: [facility];  Service: Gynecology;  Laterality: N/A;  requesting 9:00am OR time  requests one hour   ANTERIOR CERVICAL DECOMP/DISCECTOMY FUSION N/A 09/27/2017   Procedure: ANTERIOR CERVICAL DECOMPRESSION/DISCECTOMY FUSION CERVICAL FOUR-FIVE ,CERVICAL FIVE-SIX,CERVICAL SIX-SEVEN;  Surgeon: Donalee Citrin, MD;  Location: [facility] OR;  Service: Neurosurgery;  Laterality: N/A;   AXILLARY LYMPH NODE BIOPSY Right 06/24/2023    Procedure: AXILLARY LYMPH NODE BIOPSY;  Surgeon: Franky Macho, MD;  Location: [facility];  Service: General;  Laterality: Right;   BRONCHIAL BIOPSY  06/14/2023   Procedure: BRONCHIAL BIOPSIES;  Surgeon: Leslye Peer, MD;  Location: [facility] ENDOSCOPY;  Service: Pulmonary;;   BRONCHIAL BRUSHINGS  06/14/2023   Procedure: BRONCHIAL BRUSHINGS;  Surgeon: Leslye Peer, MD;  Location: [facility] ENDOSCOPY;  Service: Pulmonary;;   BRONCHIAL NEEDLE ASPIRATION BIOPSY  06/14/2023   Procedure: BRONCHIAL NEEDLE ASPIRATION BIOPSIES;  Surgeon: Leslye Peer, MD;  Location: [facility] ENDOSCOPY;  Service: Pulmonary;;   CARPAL TUNNEL RELEASE Left 09/27/2017   Procedure: CARPAL TUNNEL RELEASE;  Surgeon: Donalee Citrin, MD;  Location: [facility] OR;  Service: Neurosurgery;  Laterality: Left;   DILATATION & CURETTAGE/HYSTEROSCOPY WITH MYOSURE N/A 06/03/2021   Procedure: DILATATION & CURETTAGE/HYSTEROSCOPY WITH MYOSURE;  Surgeon: Genia Del, MD;  Location: [facility];  Service: Gynecology;  Laterality: N/A;   FOOT SURGERY     bone removed from pinky toe   HEMOSTASIS CONTROL  06/14/2023   Procedure: HEMOSTASIS CONTROL;  Surgeon: Leslye Peer, MD;  Location: [facility] ENDOSCOPY;  Service: Pulmonary;;   INGUINAL HERNIA REPAIR Right 1995   KNEE ARTHROSCOPY W/ MENISCAL REPAIR Left 06/21/2017   dr Netta Corrigan   LEG SURGERY  1972   MOHS SURGERY  2016   [facility] of forehead   MOUTH SURGERY  2015   removal and cauterization pallete of cancerous lesion   PORTACATH PLACEMENT N/A 06/24/2023   Procedure: INSERTION PORT-A-CATH;  Surgeon: Franky Macho, MD;  Location: [facility];  Service: General;  Laterality: N/A;   TONSILLECTOMY  1979   TONSILLECTOMY  1977   TOTAL KNEE ARTHROPLASTY Left 01/26/2018   Procedure: LEFT TOTAL KNEE ARTHROPLASTY;  Surgeon: Ranee Gosselin, MD;  Location: [facility];  Service: Orthopedics;  Laterality: Left;   TUBAL LIGATION     VIDEO BRONCHOSCOPY WITH ENDOBRONCHIAL ULTRASOUND Bilateral 06/14/2023   Procedure: VIDEO  BRONCHOSCOPY WITH ENDOBRONCHIAL ULTRASOUND;  Surgeon: Leslye Peer, MD;  Location: [facility] ENDOSCOPY;  Service: Pulmonary;  Laterality: Bilateral;     reports that she quit smoking about 13 years ago. Her smoking use included cigarettes. She started smoking about 48 years ago. She has a 17.5 pack-year smoking history. She has never used smokeless tobacco. She reports that she does not currently use alcohol. She reports that she does not use drugs.  Allergies  Allergen Reactions   Influenza Vaccines Anaphylaxis and Other (See Comments)    Per patient   Penicillins Anaphylaxis and Other (See Comments)   Articaine Other (See Comments)    Caused infection   Cortisone Other (See Comments)    Turned red and ran a low grade fever for 3 days   Other Rash    bandaids- skin turns red and a rash     Family History  Problem Relation Age of Onset   Diabetes Mother    Heart Problems Mother    Rheum arthritis Mother    COPD Mother  Polycystic kidney disease Mother    Carpal tunnel syndrome Mother    Cancer Father        Liver Cancer   Liver cancer Father    Carpal tunnel syndrome Sister    Thyroid disease Sister    Rheum arthritis Sister    Lung disease Sister    Diabetes Sister    Carpal tunnel syndrome Sister    Clotting disorder Sister    Rheum arthritis Sister    Thyroid disease Sister    COPD Sister    Heart Problems Sister    Gout Sister    Cancer Brother    Heart Problems Maternal Grandmother    Cancer Maternal Grandfather        Lung Cancer   Heart Problems Maternal Grandfather    Heart Problems Paternal Grandmother    Cancer Paternal Grandfather        Brain & Skin cancer   Polycystic ovary syndrome Daughter    Rheum arthritis Maternal Aunt    Fibromyalgia Maternal Aunt    Polycystic kidney disease Maternal Aunt    Heart Problems Maternal Aunt    Anemia Maternal Aunt    Adrenal disorder Maternal Aunt    Carpal tunnel syndrome Maternal Aunt    Diabetes Maternal Aunt     Thyroid disease Maternal Aunt    Rheum arthritis Maternal Aunt    Diabetes Maternal Aunt    Diabetes Maternal Aunt    Rheum arthritis Maternal Aunt    Heart Problems Maternal Aunt    Rheum arthritis Maternal Uncle    Heart Problems Maternal Uncle     Prior to Admission medications   Medication Sig Start Date End Date Taking? Authorizing Provider  folic acid (FOLVITE) 1 MG tablet Take 1 tablet (1 mg total) by mouth daily. 07/01/23  Yes [name], MD  furosemide (LASIX) 20 MG tablet Take 1 tablet (20 mg total) by mouth daily. Patient taking differently: Take 40 mg by mouth daily. 06/26/23 06/25/24 Yes Chenae Brager, Courage, MD  guaiFENesin (MUCINEX) 600 MG 12 hr tablet Take 2 tablets (1,200 mg total) by mouth 2 (two) times daily. 06/26/23  Yes Larie Mathes, Courage, MD  oxyCODONE (OXY IR/ROXICODONE) 5 MG immediate release tablet Take 1 tablet (5 mg total) by mouth every 4 (four) hours as needed for moderate pain. 06/30/23  Yes [name], MD  prochlorperazine (COMPAZINE) 10 MG tablet Take 1 tablet (10 mg total) by mouth every 6 (six) hours as needed for nausea or vomiting. 07/01/23  Yes [name], MD  progesterone (PROMETRIUM) 200 MG capsule Nightly 10 days every 3 months 05/19/23  Yes Lazaro Arms, MD  saccharomyces boulardii (FLORASTOR) 250 MG capsule Take 1 capsule (250 mg total) by mouth 2 (two) times daily. 06/26/23  Yes Shon Hale, MD  lidocaine-prilocaine (EMLA) cream Apply a quarter-sized amount to port a cath site and cover with plastic wrap 1 hour prior to infusion appointments Patient not taking: Reported on 07/06/2023 07/01/23   [name], MD  Multiple Vitamin (MULTIVITAMIN WITH MINERALS) TABS tablet Take 1 tablet by mouth daily. Patient not taking: Reported on 07/01/2023 06/27/23   Shon Hale, MD    Physical Exam: Vitals:   07/06/23 1251 07/06/23 1343 07/06/23 1415 07/06/23 1417  BP:  98/68 102/67   Pulse:  (!) 135 (!) 134   Resp:  18 18    Temp:  99 F (37.2 C)  (!) 102.2 F (39 C)  TempSrc:  Oral  Rectal  SpO2:  95% 94%  Weight:    103 kg  Height: 5\' 6"  (1.676 m)   5\' 6"  (1.676 m)    Constitutional: NAD, calm, comfortable Vitals:   07/06/23 1251 07/06/23 1343 07/06/23 1415 07/06/23 1417  BP:  98/68 102/67   Pulse:  (!) 135 (!) 134   Resp:  18 18   Temp:  99 F (37.2 C)  (!) 102.2 F (39 C)  TempSrc:  Oral  Rectal  SpO2:  95% 94%   Weight:    103 kg  Height: 5\' 6"  (1.676 m)   5\' 6"  (1.676 m)   Eyes: PERRL, lids and conjunctivae normal ENMT: Mucous membranes are moist. Neck: normal, supple, no masses, no thyromegaly Respiratory: clear to auscultation bilaterally, no wheezing, no crackles. Normal respiratory effort. No accessory muscle use.  Cardiovascular: Regular rate and rhythm, no murmurs / rubs / gallops.  2+ pitting bilateral lower extremity edema to upper legs , extremities warm.  Port left upper chest-area looks clean, no erythema or tenderness or discharge. Abdomen: no tenderness, no masses palpated. No hepatosplenomegaly. Bowel sounds positive.  Musculoskeletal: no clubbing / cyanosis. No joint deformity upper and lower extremities.  Skin: no rashes, lesions, ulcers. No induration Neurologic: No apparent cranial nerve abnormality, moving extremities spontaneously Psychiatric: Normal judgment and insight. Alert and oriented x 3.  Anxious, depressed affect  Labs on Admission: I have personally reviewed following labs and imaging studies  CBC: Recent Labs  Lab 07/01/23 1305 07/06/23 1305  WBC 12.0* 12.2*  NEUTROABS 10.2* 10.9*  HGB 10.9* 11.7*  HCT 35.4* 37.7  MCV 82.1 81.1  PLT 481* 527*   Basic Metabolic Panel: Recent Labs  Lab 07/01/23 1305 07/06/23 1305  NA 132* 133*  K 3.1* 3.5  CL 97* 97*  CO2 27 24  GLUCOSE 126* 166*  BUN 8 14  CREATININE 0.60 0.75  CALCIUM 7.8* 8.2*  MG 2.0 2.0   GFR: Estimated Creatinine Clearance: 86.1 mL/min (by C-G formula based on SCr of 0.75  mg/dL). Liver Function Tests: Recent Labs  Lab 07/01/23 1305 07/06/23 1305  AST 16 66*  ALT 28 53*  ALKPHOS 169* 168*  BILITOT 0.8 0.8  PROT 6.0* 6.3*  ALBUMIN 2.4* 2.3*   Coagulation Profile: Recent Labs  Lab 07/06/23 1443  INR 1.3*   Urine analysis:    Component Value Date/Time   COLORURINE YELLOW 06/11/2023 0526   APPEARANCEUR CLEAR 06/11/2023 0526   APPEARANCEUR Clear 04/06/2023 1106   LABSPEC >1.046 (H) 06/11/2023 0526   PHURINE 7.0 06/11/2023 0526   GLUCOSEU NEGATIVE 06/11/2023 0526   HGBUR NEGATIVE 06/11/2023 0526   BILIRUBINUR NEGATIVE 06/11/2023 0526   BILIRUBINUR Negative 04/06/2023 1106   KETONESUR 20 (A) 06/11/2023 0526   PROTEINUR NEGATIVE 06/11/2023 0526   NITRITE NEGATIVE 06/11/2023 0526   LEUKOCYTESUR NEGATIVE 06/11/2023 0526    Radiological Exams on Admission: CT Angio Chest PE W and/or Wo Contrast  Result Date: 07/06/2023 CLINICAL DATA:  Sepsis; Pulmonary embolism (PE) suspected, high prob. Shortness of breath. Edema. History of lung carcinoma. Urinary incontinence. Burning with urination. * Tracking Code: BO * EXAM: CT ANGIOGRAPHY CHEST CT ABDOMEN AND PELVIS WITH CONTRAST TECHNIQUE: Multidetector CT imaging of the chest was performed using the standard protocol during bolus administration of intravenous contrast. Multiplanar CT image reconstructions and MIPs were obtained to evaluate the vascular anatomy. Multidetector CT imaging of the abdomen and pelvis was performed using the standard protocol during bolus administration of intravenous contrast. RADIATION DOSE REDUCTION: This exam was performed according to the  departmental dose-optimization program which includes automated exposure control, adjustment of the mA and/or kV according to patient size and/or use of iterative reconstruction technique. CONTRAST:  OMNIPAQUE IOHEXOL 350 MG/ML SOLN COMPARISON:  CT scan abdomen and pelvis from 06/11/2023 and CT angiography chest from 05/20/2023. FINDINGS: CTA  CHEST FINDINGS Cardiovascular: No evidence of embolism to the proximal subsegmental pulmonary artery level. Please note, left lung lower lobe pulmonary artery is compressed by patient's pre-existing left lung lower lobe mass however, there is no embolism within. Normal cardiac size. No pericardial effusion. No aortic aneurysm. Mediastinum/Nodes: Visualized thyroid gland appears grossly unremarkable. No solid / cystic mediastinal masses. The esophagus is nondistended precluding optimal assessment. No axillary, mediastinal or hilar lymphadenopathy by size criteria. Lungs/Pleura: The trachea and right bronchial tree is patent. There is abrupt cutoff of left lower lobe bronchial tree. There is near complete collapse of the left lung lower lobe except few small areas of aeration of the superior segment, posteriorly. There is heterogeneous left mass however, accurate measurement is difficult on this exam however, there is significant interval increase in the size when compared to the prior exam from 05/20/2023. There is resultant postobstructive pneumonia versus atelectasis of the left lung lower lobe. There is small right pleural effusion, new since the prior study. There are associated compressive atelectatic changes in the right lung lower lobe. No right lung mass or consolidation. No suspicious lung nodules. Musculoskeletal: A CT Port-a-Cath is seen in the left upper chest wall with the catheter terminating in the cavo-atrial junction region. Visualized soft tissues of the chest wall are otherwise grossly unremarkable. No suspicious osseous lesions. There are mild multilevel degenerative changes in the visualized spine. Review of the MIP images confirms the above findings. CT ABDOMEN and PELVIS FINDINGS Hepatobiliary: The liver is normal in size. Non-cirrhotic configuration. No suspicious mass. No intrahepatic or extrahepatic bile duct dilation. No calcified gallstones. Normal gallbladder wall thickness. No  pericholecystic inflammatory changes. Pancreas: Unremarkable. No pancreatic ductal dilatation or surrounding inflammatory changes. Spleen: Within normal limits. No focal lesion. Adrenals/Urinary Tract: Redemonstration of markedly enlarged right adrenal gland measuring 8.1 x 10.3 cm, similar to the prior study. There is hyperattenuating peripheral wall with central hypoattenuating areas and intervening hyperattenuating septations. No significant interval change since the prior study. Unremarkable left adrenal gland. No suspicious renal mass. There is a stable partially exophytic 5.2 x 7.9 cm cyst arising from the right kidney upper pole, posteriorly. No hydronephrosis. No renal or ureteric calculi. Unremarkable urinary bladder. Stomach/Bowel: No disproportionate dilation of the small or large bowel loops. No evidence of abnormal bowel wall thickening or inflammatory changes. The appendix was not confidently visualized; however there is no acute inflammatory process in the right lower quadrant. Vascular/Lymphatic: No ascites or pneumoperitoneum. No abdominal or pelvic lymphadenopathy, by size criteria. No aneurysmal dilation of the major abdominal arteries. There are mild peripheral atherosclerotic vascular calcifications of the aorta and its major branches. Reproductive: Normal-size anteverted uterus. Redemonstration of thickened endometrium measuring up to 9 mm, without discrete focal mass. No large adnexal mass. Bilateral fallopian tube closure devices noted. Other: There is a tiny fat containing umbilical hernia. The soft tissues and abdominal wall are otherwise unremarkable. Musculoskeletal: No suspicious osseous lesions. There are mild multilevel degenerative changes in the visualized spine. Review of the MIP images confirms the above findings. IMPRESSION: 1. No evidence of pulmonary embolism to the proximal subsegmental pulmonary artery level. 2. Interval increase in size of left lung lower lobe mass with  resultant postobstructive  pneumonia versus atelectasis of the left lung lower lobe. Increasing right pleural effusion. 3. Stable right adrenal mass. 4. Stable thickened endometrium.  No focal mass. 5. Multiple other nonacute observations, as described above. Electronically Signed   By: Jules Schick M.D.   On: 07/06/2023 17:15   CT ABDOMEN PELVIS W CONTRAST  Result Date: 07/06/2023 CLINICAL DATA:  Sepsis; Pulmonary embolism (PE) suspected, high prob. Shortness of breath. Edema. History of lung carcinoma. Urinary incontinence. Burning with urination. * Tracking Code: BO * EXAM: CT ANGIOGRAPHY CHEST CT ABDOMEN AND PELVIS WITH CONTRAST TECHNIQUE: Multidetector CT imaging of the chest was performed using the standard protocol during bolus administration of intravenous contrast. Multiplanar CT image reconstructions and MIPs were obtained to evaluate the vascular anatomy. Multidetector CT imaging of the abdomen and pelvis was performed using the standard protocol during bolus administration of intravenous contrast. RADIATION DOSE REDUCTION: This exam was performed according to the departmental dose-optimization program which includes automated exposure control, adjustment of the mA and/or kV according to patient size and/or use of iterative reconstruction technique. CONTRAST:  OMNIPAQUE IOHEXOL 350 MG/ML SOLN COMPARISON:  CT scan abdomen and pelvis from 06/11/2023 and CT angiography chest from 05/20/2023. FINDINGS: CTA CHEST FINDINGS Cardiovascular: No evidence of embolism to the proximal subsegmental pulmonary artery level. Please note, left lung lower lobe pulmonary artery is compressed by patient's pre-existing left lung lower lobe mass however, there is no embolism within. Normal cardiac size. No pericardial effusion. No aortic aneurysm. Mediastinum/Nodes: Visualized thyroid gland appears grossly unremarkable. No solid / cystic mediastinal masses. The esophagus is nondistended precluding optimal assessment.  No axillary, mediastinal or hilar lymphadenopathy by size criteria. Lungs/Pleura: The trachea and right bronchial tree is patent. There is abrupt cutoff of left lower lobe bronchial tree. There is near complete collapse of the left lung lower lobe except few small areas of aeration of the superior segment, posteriorly. There is heterogeneous left mass however, accurate measurement is difficult on this exam however, there is significant interval increase in the size when compared to the prior exam from 05/20/2023. There is resultant postobstructive pneumonia versus atelectasis of the left lung lower lobe. There is small right pleural effusion, new since the prior study. There are associated compressive atelectatic changes in the right lung lower lobe. No right lung mass or consolidation. No suspicious lung nodules. Musculoskeletal: A CT Port-a-Cath is seen in the left upper chest wall with the catheter terminating in the cavo-atrial junction region. Visualized soft tissues of the chest wall are otherwise grossly unremarkable. No suspicious osseous lesions. There are mild multilevel degenerative changes in the visualized spine. Review of the MIP images confirms the above findings. CT ABDOMEN and PELVIS FINDINGS Hepatobiliary: The liver is normal in size. Non-cirrhotic configuration. No suspicious mass. No intrahepatic or extrahepatic bile duct dilation. No calcified gallstones. Normal gallbladder wall thickness. No pericholecystic inflammatory changes. Pancreas: Unremarkable. No pancreatic ductal dilatation or surrounding inflammatory changes. Spleen: Within normal limits. No focal lesion. Adrenals/Urinary Tract: Redemonstration of markedly enlarged right adrenal gland measuring 8.1 x 10.3 cm, similar to the prior study. There is hyperattenuating peripheral wall with central hypoattenuating areas and intervening hyperattenuating septations. No significant interval change since the prior study. Unremarkable left  adrenal gland. No suspicious renal mass. There is a stable partially exophytic 5.2 x 7.9 cm cyst arising from the right kidney upper pole, posteriorly. No hydronephrosis. No renal or ureteric calculi. Unremarkable urinary bladder. Stomach/Bowel: No disproportionate dilation of the small or large bowel loops. No evidence  of abnormal bowel wall thickening or inflammatory changes. The appendix was not confidently visualized; however there is no acute inflammatory process in the right lower quadrant. Vascular/Lymphatic: No ascites or pneumoperitoneum. No abdominal or pelvic lymphadenopathy, by size criteria. No aneurysmal dilation of the major abdominal arteries. There are mild peripheral atherosclerotic vascular calcifications of the aorta and its major branches. Reproductive: Normal-size anteverted uterus. Redemonstration of thickened endometrium measuring up to 9 mm, without discrete focal mass. No large adnexal mass. Bilateral fallopian tube closure devices noted. Other: There is a tiny fat containing umbilical hernia. The soft tissues and abdominal wall are otherwise unremarkable. Musculoskeletal: No suspicious osseous lesions. There are mild multilevel degenerative changes in the visualized spine. Review of the MIP images confirms the above findings. IMPRESSION: 1. No evidence of pulmonary embolism to the proximal subsegmental pulmonary artery level. 2. Interval increase in size of left lung lower lobe mass with resultant postobstructive pneumonia versus atelectasis of the left lung lower lobe. Increasing right pleural effusion. 3. Stable right adrenal mass. 4. Stable thickened endometrium.  No focal mass. 5. Multiple other nonacute observations, as described above. Electronically Signed   By: Jules Schick M.D.   On: 07/06/2023 17:15   DG Chest Portable 1 View  Result Date: 07/06/2023 CLINICAL DATA:  Cough and swelling EXAM: PORTABLE CHEST 1 VIEW COMPARISON:  X-ray 06/24/2023 FINDINGS: Left upper chest port.  Tip along the central SVC. Stable cardiopericardial silhouette. No pneumothorax or effusion. Developing left retrocardiac opacity. New infiltrates possible. Stable left perihilar mass and possible additional nodular area in the medial right lung base as on prior. Please correlate with prior workup. Overlapping cardiac leads. Fixation hardware along the lower cervical spine at the edge of the imaging field. IMPRESSION: Developing left retrocardiac opacity.  Possible new infiltrate. Stable left perihilar lung mass with chest port Electronically Signed   By: Karen Kays M.D.   On: 07/06/2023 16:38    EKG: Independently reviewed.  Sinus tachycardia rate 117,  QTC 418.  No significant ST or T wave changes from prior  Assessment/Plan Principal Problem:   PNA (pneumonia) Active Problems:   Severe sepsis (HCC)   recent history of C. difficile colitis   Metastatic primary lung cancer, left (HCC)   Assessment and Plan: * PNA (pneumonia) Pneumonia with severe sepsis.  Not Hypoxic.  COVID test negative.  CTA chest-negative for PE, Interval increase in size of left lung lower lobe mass with resultant postobstructive pneumonia versus atelectasis of the left lung lower lobe. Increasing right pleural effusion. -Continue Vancomycin aztreonam and metronidazole (Recent hospitalization, anaerobic coverage needed for postobstructive pneumonia, and penicillin allergy-anaphylaxis limiting options) -Mucolytic's  Severe sepsis (HCC) Meeting severe sepsis criteria with fever of up to 102, tachycardia, heart rate 112-134, respiratory rate 18-22.,  Leukocytosis of 12.  With evidence of endorgan dysfunction, lactic acidosis of 2.2 > 1.4 after fluids.  Severe sepsis 2/2 post-obstructive pneumonia.  Port-A-Cath area looks clean without signs of infection.  CT abdomen and pelvis with contrast negative for infectious intra-abdominal etiology. -Follow-up blood cultures -Follow-up UA -Continue Vanco aztreonam and  metronidazole, narrow as able  Swelling of both lower extremities 2+ swelling to bilateral lower extremities.  Last echo 12/2020, EF of 65 to 70%.  BNP unremarkable at 47. -Obtain echo -Bilateral lower extremity venous Dopplers -Hold home Lasix 40 mg daily for now -Hold off on diuresis at this time with severe sepsis   recent history of C. difficile colitis Symptoms have resolved.  Was treated with 10 days  of Dificid and then completed another 10 days of oral vancomycin for persistent C. difficile infection. -Continue Florastor  Metastatic primary lung cancer, left (HCC) Follows with Dr. 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  Just completed 5 treatments of radiation therapy.  -Resume home pain meds   DVT prophylaxis: Lovenox Code Status: FULL code Family Communication: Spouse at bedside Disposition Plan: > 2 days Consults called: None Admission status: Inpt tele I certify that at the point of admission it is my clinical judgment that the patient will require inpatient hospital care spanning beyond 2 midnights from the point of admission due to high intensity of service, high risk for further deterioration and high frequency of surveillance required.    Author: Onnie Boer, MD 07/06/2023 8:20 PM  For on call review [email or web address].

## 2023-07-06 NOTE — Assessment & Plan Note (Addendum)
Meeting severe sepsis criteria with fever of up to 102, tachycardia, heart rate 112-134, respiratory rate 18-22.,  Leukocytosis of 12.  With evidence of endorgan dysfunction, lactic acidosis of 2.2 > 1.4 after fluids.  Severe sepsis 2/2 post-obstructive pneumonia.  Port-A-Cath area looks clean without signs of infection.  CT abdomen and pelvis with contrast negative for infectious intra-abdominal etiology. -Follow-up blood cultures -Follow-up UA -Continue Vanco aztreonam and metronidazole, narrow as able

## 2023-07-06 NOTE — Assessment & Plan Note (Addendum)
Pneumonia with severe sepsis.  Not Hypoxic.  COVID test negative.  CTA chest-negative for PE, Interval increase in size of left lung lower lobe mass with resultant postobstructive pneumonia versus atelectasis of the left lung lower lobe. Increasing right pleural effusion. -Continue Vancomycin aztreonam and metronidazole (Recent hospitalization, anaerobic coverage needed for postobstructive pneumonia, and penicillin allergy-anaphylaxis limiting options) -Mucolytic's

## 2023-07-06 NOTE — Assessment & Plan Note (Signed)
Symptoms have resolved.  Was treated with 10 days of Dificid and then completed another 10 days of oral vancomycin for persistent C. difficile infection. -Continue Florastor

## 2023-07-06 NOTE — Assessment & Plan Note (Addendum)
Follows with Dr. 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  Just completed 5 treatments of radiation therapy.  -Resume home pain meds

## 2023-07-06 NOTE — ED Provider Notes (Signed)
[facility] AT [facility] Provider Note   CSN: [id number] Arrival date & time: 07/06/23  1241     History  Chief Complaint  Patient presents with   Shortness of Breath    Miranda Perkins is a 62 y.o. female.  HPI 62 year old female with a diagnosis of metastatic lung cancer and recent admission for C. difficile colitis presents with weakness and leg edema.  She has been having edema ever since she got out of the hospital 1-2 weeks ago.  She feels like it is getting worse.  She is also been short of breath.  She has had a cough with some small hemoptysis since having had the lung cancer but it seems to be more recently.  She has had a low-grade 99 temp over the last several days.  No chest pain but is having some abdominal pain and still having some soft stools.  However today she had 2 episodes of fecal incontinence.  She would stand up and then would have diarrhea, which prompted her to come to the ER. She notes relatively poor PO intake.   Home Medications Prior to Admission medications   Medication Sig Start Date End Date Taking? Authorizing Provider  folic acid (FOLVITE) 1 MG tablet Take 1 tablet (1 mg total) by mouth daily. 07/01/23  Yes [name], MD  furosemide (LASIX) 20 MG tablet Take 1 tablet (20 mg total) by mouth daily. Patient taking differently: Take 40 mg by mouth daily. 06/26/23 06/25/24 Yes Emokpae, Courage, MD  guaiFENesin (MUCINEX) 600 MG 12 hr tablet Take 2 tablets (1,200 mg total) by mouth 2 (two) times daily. 06/26/23  Yes Emokpae, Courage, MD  oxyCODONE (OXY IR/ROXICODONE) 5 MG immediate release tablet Take 1 tablet (5 mg total) by mouth every 4 (four) hours as needed for moderate pain. 06/30/23  Yes [name], MD  prochlorperazine (COMPAZINE) 10 MG tablet Take 1 tablet (10 mg total) by mouth every 6 (six) hours as needed for nausea or vomiting. 07/01/23  Yes [name], MD  progesterone (PROMETRIUM) 200 MG  capsule Nightly 10 days every 3 months 05/19/23  Yes Lazaro Arms, MD  saccharomyces boulardii (FLORASTOR) 250 MG capsule Take 1 capsule (250 mg total) by mouth 2 (two) times daily. 06/26/23  Yes Shon Hale, MD  lidocaine-prilocaine (EMLA) cream Apply a quarter-sized amount to port a cath site and cover with plastic wrap 1 hour prior to infusion appointments Patient not taking: Reported on 07/06/2023 07/01/23   [name], MD  Multiple Vitamin (MULTIVITAMIN WITH MINERALS) TABS tablet Take 1 tablet by mouth daily. Patient not taking: Reported on 07/01/2023 06/27/23   Shon Hale, MD      Allergies    Influenza vaccines, Penicillins, Articaine, Cortisone, and Other    Review of Systems   Review of Systems  Constitutional:  Positive for fever (low grade).  Respiratory:  Positive for cough and shortness of breath.   Cardiovascular:  Positive for leg swelling.  Gastrointestinal:  Positive for abdominal pain and diarrhea.  Neurological:  Positive for weakness.    Physical Exam Updated Vital Signs BP 102/67   Pulse (!) 134   Temp (!) 102.2 F (39 C) (Rectal)   Resp 18   Ht 5\' 6"  (1.676 m)   Wt 103 kg   SpO2 94%   BMI 36.64 kg/m  Physical Exam Vitals and nursing note reviewed.  Constitutional:      Appearance: She is well-developed.  HENT:  Head: Normocephalic and atraumatic.  Cardiovascular:     Rate and Rhythm: Regular rhythm. Tachycardia present.     Heart sounds: Normal heart sounds.  Pulmonary:     Effort: Pulmonary effort is normal.     Breath sounds: Examination of the right-lower field reveals decreased breath sounds. Decreased breath sounds present.  Abdominal:     Palpations: Abdomen is soft.     Tenderness: There is abdominal tenderness in the right upper quadrant and right lower quadrant.  Musculoskeletal:     Right lower leg: Edema present.     Left lower leg: Edema present.     Comments: Bilateral lower extremity pitting edema  Skin:     General: Skin is warm and dry.  Neurological:     Mental Status: She is alert.     ED Results / Procedures / Treatments   Labs (all labs ordered are listed, but only abnormal results are displayed) Labs Reviewed  CBC WITH DIFFERENTIAL/PLATELET - Abnormal; Notable for the following components:      Result Value   WBC 12.2 (*)    Hemoglobin 11.7 (*)    MCH 25.2 (*)    RDW 16.4 (*)    Platelets 527 (*)    Neutro Abs 10.9 (*)    Lymphs Abs 0.4 (*)    Abs Immature Granulocytes 0.10 (*)    All other components within normal limits  COMPREHENSIVE METABOLIC PANEL - Abnormal; Notable for the following components:   Sodium 133 (*)    Chloride 97 (*)    Glucose, Bld 166 (*)    Calcium 8.2 (*)    Total Protein 6.3 (*)    Albumin 2.3 (*)    AST 66 (*)    ALT 53 (*)    Alkaline Phosphatase 168 (*)    All other components within normal limits  PROTIME-INR - Abnormal; Notable for the following components:   Prothrombin Time 16.0 (*)    INR 1.3 (*)    All other components within normal limits  SARS CORONAVIRUS 2 BY RT PCR  CULTURE, BLOOD (ROUTINE X 2)  CULTURE, BLOOD (ROUTINE X 2)  BRAIN NATRIURETIC PEPTIDE  MAGNESIUM  APTT  LACTIC ACID, PLASMA  LACTIC ACID, PLASMA  URINALYSIS, W/ REFLEX TO CULTURE (INFECTION SUSPECTED)    EKG EKG Interpretation Date/Time:  Tuesday July 06 2023 14:26:40 EDT Ventricular Rate:  117 PR Interval:  164 QRS Duration:  75 QT Interval:  299 QTC Calculation: 418 R Axis:   88  Text Interpretation: Sinus tachycardia Borderline right axis deviation Low voltage, precordial leads Probable anteroseptal infarct, old Confirmed by Pricilla Loveless [phone]) on 07/06/2023 3:18:34 PM  Radiology No results found.  Procedures .Critical Care  Performed by: Pricilla Loveless, MD Authorized by: Pricilla Loveless, MD   Critical care provider statement:    Critical care time (minutes):  35   Critical care time was exclusive of:  Separately billable procedures  and treating other patients   Critical care was necessary to treat or prevent imminent or life-threatening deterioration of the following conditions:  Sepsis   Critical care was time spent personally by me on the following activities:  Development of treatment plan with patient or surrogate, discussions with consultants, evaluation of patient's response to treatment, examination of patient, ordering and review of laboratory studies, ordering and review of radiographic studies, ordering and performing treatments and interventions, pulse oximetry, re-evaluation of patient's condition and review of old charts     Medications Ordered in ED Medications  metroNIDAZOLE (FLAGYL) IVPB 500 mg (500 mg Intravenous New Bag/Given 07/06/23 1458)  vancomycin (VANCOREADY) IVPB 2000 mg/400 mL (has no administration in time range)  vancomycin (VANCOREADY) IVPB 1500 mg/300 mL (has no administration in time range)  aztreonam (AZACTAM) 2 g in sodium chloride 0.9 % 100 mL IVPB (has no administration in time range)  acetaminophen (TYLENOL) tablet 1,000 mg (1,000 mg Oral Given 07/06/23 1458)  aztreonam (AZACTAM) 2 g in sodium chloride 0.9 % 100 mL IVPB (2 g Intravenous New Bag/Given 07/06/23 1457)  sodium chloride 0.9 % bolus 500 mL (500 mLs Intravenous Bolus 07/06/23 1459)  iohexol (OMNIPAQUE) 350 MG/ML injection 100 mL (100 mLs Intravenous Contrast Given 07/06/23 1513)    ED Course/ Medical Decision Making/ A&P                                 Medical Decision Making Amount and/or Complexity of Data Reviewed Labs: ordered. Radiology: ordered. ECG/medicine tests: ordered.  Risk OTC drugs. Prescription drug management.   Patient presents with weakness and leg swelling.  She does have impressive pitting edema though she also seems to have poor p.o. intake and likely has some intravascular depletion.  Given a small bolus of fluids.  I think the majority of her tachycardia is from the fever however and she was started on  broad IV antibiotics given recent admission and fever of unclear source with SIRS criteria.  CTs were obtained.  Currently these are pending and after that she will likely need admission.  Care transferred to Dr. Maple Hudson.        Final Clinical Impression(s) / ED Diagnoses Final diagnoses:  None    Rx / DC Orders ED Discharge Orders     None         Pricilla Loveless, MD 07/06/23 1537

## 2023-07-07 ENCOUNTER — Encounter (HOSPITAL_COMMUNITY): Payer: Self-pay

## 2023-07-07 ENCOUNTER — Inpatient Hospital Stay (HOSPITAL_COMMUNITY)

## 2023-07-07 DIAGNOSIS — J189 Pneumonia, unspecified organism: Secondary | ICD-10-CM | POA: Diagnosis not present

## 2023-07-07 DIAGNOSIS — J181 Lobar pneumonia, unspecified organism: Secondary | ICD-10-CM

## 2023-07-07 DIAGNOSIS — A419 Sepsis, unspecified organism: Secondary | ICD-10-CM | POA: Diagnosis not present

## 2023-07-07 DIAGNOSIS — R6520 Severe sepsis without septic shock: Secondary | ICD-10-CM | POA: Diagnosis not present

## 2023-07-07 LAB — BASIC METABOLIC PANEL
Anion gap: 10 (ref 5–15)
BUN: 11 mg/dL (ref 8–23)
CO2: 25 mmol/L (ref 22–32)
Calcium: 7.9 mg/dL — ABNORMAL LOW (ref 8.9–10.3)
Chloride: 98 mmol/L (ref 98–111)
Creatinine, Ser: 0.59 mg/dL (ref 0.44–1.00)
GFR, Estimated: >60 mL/min (ref >60)
Glucose, Bld: 104 mg/dL — ABNORMAL HIGH (ref 70–99)
Potassium: 3.1 mmol/L — ABNORMAL LOW (ref 3.5–5.1)
Sodium: 133 mmol/L — ABNORMAL LOW (ref 135–145)

## 2023-07-07 LAB — CBC
HCT: 30.8 % — ABNORMAL LOW (ref 36.0–46.0)
Hemoglobin: 9.6 g/dL — ABNORMAL LOW (ref 12.0–15.0)
MCH: 25.5 pg — ABNORMAL LOW (ref 26.0–34.0)
MCHC: 31.2 g/dL (ref 30.0–36.0)
MCV: 81.7 fL (ref 80.0–100.0)
Platelets: 416 10*3/uL — ABNORMAL HIGH (ref 150–400)
RBC: 3.77 MIL/uL — ABNORMAL LOW (ref 3.87–5.11)
RDW: 16.3 % — ABNORMAL HIGH (ref 11.5–15.5)
WBC: 9.9 10*3/uL (ref 4.0–10.5)
nRBC: 0 % (ref 0.0–0.2)

## 2023-07-07 LAB — PROCALCITONIN: Procalcitonin: 4.81 ng/mL

## 2023-07-07 LAB — MRSA NEXT GEN BY PCR, NASAL: MRSA by PCR Next Gen: NOT DETECTED

## 2023-07-07 MED ORDER — SODIUM CHLORIDE 0.9 % IV SOLN
2.0000 g | Freq: Three times a day (TID) | INTRAVENOUS | Status: DC
  Administered 2023-07-07 – 2023-07-11 (×12): 2 g via INTRAVENOUS
  Filled 2023-07-07 (×12): qty 12.5

## 2023-07-07 MED ORDER — FOLIC ACID 1 MG PO TABS
1.0000 mg | ORAL_TABLET | Freq: Every day | ORAL | Status: DC
  Administered 2023-07-07 – 2023-07-11 (×5): 1 mg via ORAL
  Filled 2023-07-07 (×5): qty 1

## 2023-07-07 MED ORDER — POTASSIUM CHLORIDE CRYS ER 20 MEQ PO TBCR
40.0000 meq | EXTENDED_RELEASE_TABLET | Freq: Once | ORAL | Status: AC
  Administered 2023-07-07: 40 meq via ORAL
  Filled 2023-07-07: qty 2

## 2023-07-07 MED ORDER — VANCOMYCIN HCL 125 MG PO CAPS
125.0000 mg | ORAL_CAPSULE | Freq: Four times a day (QID) | ORAL | Status: DC
  Administered 2023-07-07 – 2023-07-11 (×17): 125 mg via ORAL
  Filled 2023-07-07 (×17): qty 1

## 2023-07-07 MED ORDER — MELATONIN 3 MG PO TABS
6.0000 mg | ORAL_TABLET | Freq: Every day | ORAL | Status: DC
  Administered 2023-07-07 – 2023-07-10 (×4): 6 mg via ORAL
  Filled 2023-07-07 (×4): qty 2

## 2023-07-07 MED ORDER — CHLORHEXIDINE GLUCONATE CLOTH 2 % EX PADS
6.0000 | MEDICATED_PAD | Freq: Every day | CUTANEOUS | Status: DC
  Administered 2023-07-07 – 2023-07-11 (×5): 6 via TOPICAL

## 2023-07-07 NOTE — Progress Notes (Signed)
Dr. Mariea Clonts aware of patients vaginal bleeding, patient stated this is normal amount of bleeding. No new orders, continuation with the plan of care.

## 2023-07-07 NOTE — Plan of Care (Signed)
  Problem: Education: Goal: Knowledge of General Education information will improve Description Including pain rating scale, medication(s)/side effects and non-pharmacologic comfort measures Outcome: Progressing   Problem: Health Behavior/Discharge Planning: Goal: Ability to manage health-related needs will improve Outcome: Progressing   

## 2023-07-07 NOTE — Progress Notes (Signed)
   07/07/23 1338  Assess: MEWS Score  Temp 98.3 F (36.8 C)  BP 108/66  MAP (mmHg) 80  Pulse Rate (!) 110  SpO2 97 %  O2 Device Room Air  Assess: MEWS Score  MEWS Temp 0  MEWS Systolic 0  MEWS Pulse 1  MEWS RR 1  MEWS LOC 0  MEWS Score 2  MEWS Score Color Yellow  Assess: if the MEWS score is Yellow or Red  Were vital signs accurate and taken at a resting state? Yes  Does the patient meet 2 or more of the SIRS criteria? Yes  MEWS guidelines implemented  No, previously red, continue vital signs every 4 hours  Assess: SIRS CRITERIA  SIRS Temperature  0  SIRS Pulse 1  SIRS Respirations  1  SIRS WBC 0  SIRS Score Sum  2

## 2023-07-07 NOTE — TOC Initial Note (Signed)
Transition of Care [facility]) - Initial/Assessment Note    Patient Details  Name: Miranda Perkins MRN: [id number] Date of Birth: 04-23-1961  Transition of Care [facility]) CM/SW Contact:    Leitha Bleak, RN Phone Number: 07/07/2023, 1:52 PM  Clinical Narrative:        Patient admitted with pneumonia. Patient has a high risk for readmission. Patient lives at home with her husband, son and grandson. She has a cane and walker, no other equipment needed.  Last visit [facility] palliative referral was made. Patient states they are schedule to come out Sept 9th.  She is requesting HHPT for strengthen, feels weak. MD aware to order. TOC reaching out to see which agency will accept the insurance.            Expected Discharge Plan: Home w Home Health Services Barriers to Discharge: Continued Medical Work up   Patient Goals and CMS Choice Patient states their goals for this hospitalization and ongoing recovery are:: To go home. CMS Medicare.gov Compare Post Acute Care list provided to:: Patient      Expected Discharge Plan and Services     Post Acute Care Choice: Home Health           Prior Living Arrangements/Services   Lives with:: Spouse Patient language and need for interpreter reviewed:: Yes        Need for Family Participation in Patient Care: Yes (Comment) Care giver support system in place?: Yes (comment) Current home services: DME Criminal Activity/Legal Involvement Pertinent to Current Situation/Hospitalization: No - Comment as needed  Activities of Daily Living Home Assistive Devices/Equipment: Walker (specify type) ADL Screening (condition at time of admission) Patient's cognitive ability adequate to safely complete daily activities?: Yes Is the patient deaf or have difficulty hearing?: No Does the patient have difficulty seeing, even when wearing glasses/contacts?: No Does the patient have difficulty concentrating, remembering, or making decisions?: No Patient able to express  need for assistance with ADLs?: Yes Does the patient have difficulty dressing or bathing?: No Independently performs ADLs?: No Communication: Independent Dressing (OT): Independent Grooming: Needs assistance Is this a change from baseline?: Pre-admission baseline Feeding: Independent Bathing: Needs assistance Is this a change from baseline?: Pre-admission baseline Toileting: Needs assistance Is this a change from baseline?: Pre-admission baseline In/Out Bed: Needs assistance Is this a change from baseline?: Pre-admission baseline Walks in Home: Needs assistance Is this a change from baseline?: Pre-admission baseline Does the patient have difficulty walking or climbing stairs?: Yes Weakness of Legs: Both Weakness of Arms/Hands: Both  Permission Sought/Granted        Permission granted to share info w Contact Information: Home Health  Emotional Assessment     Affect (typically observed): Accepting Orientation: : Oriented to Self, Oriented to Place, Oriented to  Time, Oriented to Situation Alcohol / Substance Use: Not Applicable Psych Involvement: No (comment)  Admission diagnosis:  PNA (pneumonia) [J18.9] Patient Active Problem List   Diagnosis Date Noted   PNA (pneumonia) 07/06/2023   Severe sepsis (HCC) 07/06/2023   Metastatic primary lung cancer, left (HCC) 06/23/2023   Class 2 obesity due to excess calories with body mass index (BMI) of 36.0 to 36.9 in adult 06/22/2023   Leukocytosis 06/21/2023   Ileus (HCC) 06/20/2023   Pulmonary nodule 06/14/2023   Thyroid nodule 06/11/2023   Intractable nausea and vomiting 06/11/2023   Hypokalemia 06/11/2023   Transaminitis 06/11/2023   recent history of C. 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   Class 3 severe obesity due to excess calories with serious  comorbidity and body mass index (BMI) of 40.0 to 44.9 in adult (HCC) 12/29/2021   DOE (dyspnea on exertion) 03/20/2021   Upper airway cough syndrome vs cough variant asthma 03/20/2021   Elevated testosterone level in female 12/20/2020   Female hirsutism 12/20/2020   Hx of total knee arthroplasty, left 01/26/2018   Spinal stenosis of cervical region 09/27/2017   Insomnia 09/14/2016   BMI 39.0-39.9,adult 09/14/2016   Swelling of both lower extremities 11/28/2015   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 [place] [address] Phone: [phone] Fax: [phone]  [id number] - [place], [place] - [address] [place] [address] Phone: [phone] Fax: [phone]     Social Determinants of Health (SDOH) Social History: SDOH Screenings   Food Insecurity: No Food Insecurity (07/06/2023)  Housing: Low Risk  (07/06/2023)  Transportation Needs: No Transportation Needs (07/06/2023)  Utilities: Not At Risk (07/06/2023)  Alcohol Screen: Low Risk  (04/27/2023)  Depression (PHQ2-9): Low Risk  (04/15/2023)  Financial Resource Strain: Low Risk  (06/09/2023)  Physical Activity: Inactive (06/09/2023)  Social Connections: Unknown (06/09/2023)  Recent Concern: Social Connections - Moderately Isolated (04/27/2023)  Stress: Stress Concern Present (06/09/2023)  Tobacco Use: Medium Risk (07/06/2023)   SDOH Interventions:     Readmission Risk Interventions    07/07/2023    1:50 PM  Readmission Risk Prevention Plan  Transportation Screening Complete  PCP or Specialist Appt within 3-5 Days Not Complete  HRI or Home Care Consult Complete  Social Work Consult for Recovery Care Planning/Counseling Complete  Palliative Care Screening Complete  Medication Review Oceanographer)  Complete

## 2023-07-07 NOTE — Progress Notes (Signed)
PROGRESS NOTE  Miranda Perkins XBM:[id number] DOB: [birth year] DOA: 07/06/2023 PCP: [name], DO  Brief History:   62 y.o. female with medical history significant for metastatic lung adenocarcinom , CVA, adrenal hemorrhage, and c diff colitis presents with 4 days of sob and cough.  The patient had some fevers and chills.  She stated her temperature was up to 99.9 F.  She has had some posttussive emesis at home.  In addition, she has been complaining of increasing shortness of breath over the past 4 days with lower extremity edema.  She has had a small amount of blood-tinged sputum.  Patient was recently hospitalized 8/18 to 8/24 for persistent diarrhea from C. difficile infection after she had completed 10 days of Dificid.  On discharge symptoms solved, and she was discharged home to complete 10 days of oral vancomycin   She continues to have some vaginal bleeding.  She states that her dose of furosemide was recently increased to 40 mg daily.  She continues to have lower extremity edema.  ED Course: Tmax of 102.2, initial tachycardia to 135 improved after fluids now low 110s.  Respirate rate 18-22.  Blood pressure systolic 98-115.  O2 sats 93 to 95% on room air.  WBC 12.  Lactic acid 2.2 > 1.4.  COVID test negative. CTA chest negative for PE,  Interval increase in size of left lung lower lobe mass with resultant postobstructive pneumonia versus atelectasis of the left lung lower lobe. Increasing right pleural effusion. Vancomycin, aztreonam and metronidazole started.   Assessment/Plan: Lobar pneumonia/postobstructive pneumonia -07/06/2023 CTA chest negative for PE; interval increased in LLL mass with resultant postobstructive PNA versus atelectasis -Continue vancomycin, aztreonam, metronidazole -CT abd/ pelvis-Stable right adrenal mass; Stable thickening of the endometrium with no focal mass -Check PCT  Severe sepsis Presented with fever, tachycardia,  leukocytosis -Lactic acid 2.2>> 1.4 -Follow blood cultures -Continue vancomycin, aztreonam, metronidazole  Recent history of C. difficile colitis -Restart oral vancomycin while on antibiotics for pneumonia  Lower extremity edema -Likely secondary to low albumin -Albumin has dropped from 4.6-2.3 in the last 2 months -Venous duplex -Hold furosemide temporarily -repeat echo  Hypokalemia -Replete -Magnesium 2.0  Metastatic primary lung cancer, left (HCC) Follows with Dr. 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  Just completed 5 treatments of radiation therapy.           Family Communication:  no  Family at bedside  Consultants:  none  Code Status:  FULL   DVT Prophylaxis:  [place] Lovenox   Procedures: As Listed in Progress Note Above  Antibiotics: Vanc 9/3>> Aztreonam 9/3>> Metronidazole 9/3>>      Subjective:  Patient states that she has some shortness of breath.  She denies any chest pain, abdominal pain.  She still has right-sided back pain.  She denies any headache, fevers, chills, hemoptysis since admission. Objective: Vitals:   07/06/23 1830 07/06/23 1904 07/06/23 2320 07/07/23 0335  BP: 105/60 115/67 (!) 115/53 117/81  Pulse: (!) 112 (!) 113 (!) 109 (!) 105  Resp: (!) 22 20 20  (!) 22  Temp: 98.8 F (37.1 C) 98.9 F (37.2 C) 98.2 F (36.8 C) 98.2 F (36.8 C)  TempSrc: Oral Oral Oral Oral  SpO2: 93% 94% 95% 92%  Weight:    106.6 kg  Height:        Intake/Output Summary (Last 24 hours)  at 07/07/2023 0825 Last data filed at 07/07/2023 0401 Gross per 24 hour  Intake 200 ml  Output --  Net 200 ml   Weight change:  Exam:  General:  Pt is alert, follows commands appropriately, not in acute distress HEENT: No icterus, No thrush, No neck mass, [place]/AT Cardiovascular: RRR, S1/S2, no rubs, no gallops Respiratory: Right basilar  rales.  No wheezing.  Good air movement Abdomen: Soft/+BS, non tender, non distended, no guarding Extremities: No edema, No lymphangitis, No petechiae, No rashes, no synovitis   Data Reviewed: I have personally reviewed following labs and imaging studies Basic Metabolic Panel: Recent Labs  Lab 07/01/23 1305 07/06/23 1305 07/07/23 0436  NA 132* 133* 133*  K 3.1* 3.5 3.1*  CL 97* 97* 98  CO2 27 24 25   GLUCOSE 126* 166* 104*  BUN 8 14 11   CREATININE 0.60 0.75 0.59  CALCIUM 7.8* 8.2* 7.9*  MG 2.0 2.0  --    Liver Function Tests: Recent Labs  Lab 07/01/23 1305 07/06/23 1305  AST 16 66*  ALT 28 53*  ALKPHOS 169* 168*  BILITOT 0.8 0.8  PROT 6.0* 6.3*  ALBUMIN 2.4* 2.3*   No results for input(s): "LIPASE", "AMYLASE" in the last 168 hours. No results for input(s): "AMMONIA" in the last 168 hours. Coagulation Profile: Recent Labs  Lab 07/06/23 1443  INR 1.3*   CBC: Recent Labs  Lab 07/01/23 1305 07/06/23 1305 07/07/23 0436  WBC 12.0* 12.2* 9.9  NEUTROABS 10.2* 10.9*  --   HGB 10.9* 11.7* 9.6*  HCT 35.4* 37.7 30.8*  MCV 82.1 81.1 81.7  PLT 481* 527* 416*   Cardiac Enzymes: No results for input(s): "CKTOTAL", "CKMB", "CKMBINDEX", "TROPONINI" in the last 168 hours. BNP: Invalid input(s): "POCBNP" CBG: No results for input(s): "GLUCAP" in the last 168 hours. HbA1C: No results for input(s): "HGBA1C" in the last 72 hours. Urine analysis:    Component Value Date/Time   COLORURINE YELLOW 06/11/2023 0526   APPEARANCEUR CLEAR 06/11/2023 0526   APPEARANCEUR Clear 04/06/2023 1106   LABSPEC >1.046 (H) 06/11/2023 0526   PHURINE 7.0 06/11/2023 0526   GLUCOSEU NEGATIVE 06/11/2023 0526   HGBUR NEGATIVE 06/11/2023 0526   BILIRUBINUR NEGATIVE 06/11/2023 0526   BILIRUBINUR Negative 04/06/2023 1106   KETONESUR 20 (A) 06/11/2023 0526   PROTEINUR NEGATIVE 06/11/2023 0526   NITRITE NEGATIVE 06/11/2023 0526   LEUKOCYTESUR NEGATIVE 06/11/2023 0526   Sepsis  Labs: @LABRCNTIP (procalcitonin:4,lacticidven:4) ) Recent Results (from the past 240 hour(s))  SARS Coronavirus 2 by RT PCR (hospital order, performed in [facility] hospital lab) *cepheid single result test* Anterior Nasal Swab     Status: None   Collection Time: 07/06/23  2:20 PM   Specimen: Anterior Nasal Swab  Result Value Ref Range Status   SARS Coronavirus 2 by RT PCR NEGATIVE NEGATIVE Final    Comment: (NOTE) SARS-CoV-2 target nucleic acids are NOT DETECTED.  The SARS-CoV-2 RNA is generally detectable in upper and lower respiratory specimens during the acute phase of infection. The lowest concentration of SARS-CoV-2 viral copies this assay can detect is 250 copies / mL. A negative result does not preclude SARS-CoV-2 infection and should not be used as the sole basis for treatment or other patient management decisions.  A negative result may occur with improper specimen collection / handling, submission of specimen other than nasopharyngeal swab, presence of viral mutation(s) within the areas targeted by this assay, and inadequate number of viral copies (<250 copies / mL). A negative result must be combined  with clinical observations, patient history, and epidemiological information.  Fact Sheet for Patients:   RoadLapTop.co.za  Fact Sheet for Healthcare Providers: http://kim-miller.com/  This test is not yet approved or  cleared by the [place] FDA and has been authorized for detection and/or diagnosis of SARS-CoV-2 by FDA under an Emergency Use Authorization (EUA).  This EUA will remain in effect (meaning this test can be used) for the duration of the COVID-19 declaration under Section 564(b)(1) of the Act, 21 U.S.C. section 360bbb-3(b)(1), unless the authorization is terminated or revoked sooner.  Performed at [facility], [address] [place], [place] [address]   Blood Culture (routine x 2)     Status: None  (Preliminary result)   Collection Time: 07/06/23  2:43 PM   Specimen: BLOOD  Result Value Ref Range Status   Specimen Description BLOOD BLOOD RIGHT ARM  Final   Special Requests   Final    BOTTLES DRAWN AEROBIC AND ANAEROBIC Blood Culture adequate volume   Culture   Final    NO GROWTH < 24 HOURS Performed at [facility], [address], [place], [place] [address]    Report Status PENDING  Incomplete  Blood Culture (routine x 2)     Status: None (Preliminary result)   Collection Time: 07/06/23  2:43 PM   Specimen: BLOOD  Result Value Ref Range Status   Specimen Description BLOOD BLOOD LEFT ARM  Final   Special Requests   Final    BOTTLES DRAWN AEROBIC AND ANAEROBIC Blood Culture adequate volume   Culture   Final    NO GROWTH < 24 HOURS Performed at [facility], [address], [place], [place] [address]    Report Status PENDING  Incomplete     Scheduled Meds:  Chlorhexidine Gluconate Cloth  6 each Topical Daily   enoxaparin (LOVENOX) injection  50 mg Subcutaneous Q24H   guaiFENesin-dextromethorphan  15 mL Oral Q8H   progesterone  200 mg Oral QHS   saccharomyces boulardii  250 mg Oral BID   Continuous Infusions:  aztreonam 2 g (07/07/23 0609)   metronidazole Stopped (07/07/23 0223)   vancomycin      Procedures/Studies: CT Angio Chest PE W and/or Wo Contrast  Result Date: 07/06/2023 CLINICAL DATA:  Sepsis; Pulmonary embolism (PE) suspected, high prob. Shortness of breath. Edema. History of lung carcinoma. Urinary incontinence. Burning with urination. * Tracking Code: BO * EXAM: CT ANGIOGRAPHY CHEST CT ABDOMEN AND PELVIS WITH CONTRAST TECHNIQUE: Multidetector CT imaging of the chest was performed using the standard protocol during bolus administration of intravenous contrast. Multiplanar CT image reconstructions and MIPs were obtained to evaluate the vascular anatomy. Multidetector CT imaging of the abdomen and pelvis was performed using the standard protocol during bolus  administration of intravenous contrast. RADIATION DOSE REDUCTION: This exam was performed according to the departmental dose-optimization program which includes automated exposure control, adjustment of the mA and/or kV according to patient size and/or use of iterative reconstruction technique. CONTRAST:  OMNIPAQUE IOHEXOL 350 MG/ML SOLN COMPARISON:  CT scan abdomen and pelvis from 06/11/2023 and CT angiography chest from 05/20/2023. FINDINGS: CTA CHEST FINDINGS Cardiovascular: No evidence of embolism to the proximal subsegmental pulmonary artery level. Please note, left lung lower lobe pulmonary artery is compressed by patient's pre-existing left lung lower lobe mass however, there is no embolism within. Normal cardiac size. No pericardial effusion. No aortic aneurysm. Mediastinum/Nodes: Visualized thyroid gland appears grossly unremarkable. No solid / cystic mediastinal masses. The esophagus is nondistended precluding optimal assessment.  No axillary, mediastinal or hilar lymphadenopathy by size criteria. Lungs/Pleura: The trachea and right bronchial tree is patent. There is abrupt cutoff of left lower lobe bronchial tree. There is near complete collapse of the left lung lower lobe except few small areas of aeration of the superior segment, posteriorly. There is heterogeneous left mass however, accurate measurement is difficult on this exam however, there is significant interval increase in the size when compared to the prior exam from 05/20/2023. There is resultant postobstructive pneumonia versus atelectasis of the left lung lower lobe. There is small right pleural effusion, new since the prior study. There are associated compressive atelectatic changes in the right lung lower lobe. No right lung mass or consolidation. No suspicious lung nodules. Musculoskeletal: A CT Port-a-Cath is seen in the left upper chest wall with the catheter terminating in the cavo-atrial junction region. Visualized soft tissues of  the chest wall are otherwise grossly unremarkable. No suspicious osseous lesions. There are mild multilevel degenerative changes in the visualized spine. Review of the MIP images confirms the above findings. CT ABDOMEN and PELVIS FINDINGS Hepatobiliary: The liver is normal in size. Non-cirrhotic configuration. No suspicious mass. No intrahepatic or extrahepatic bile duct dilation. No calcified gallstones. Normal gallbladder wall thickness. No pericholecystic inflammatory changes. Pancreas: Unremarkable. No pancreatic ductal dilatation or surrounding inflammatory changes. Spleen: Within normal limits. No focal lesion. Adrenals/Urinary Tract: Redemonstration of markedly enlarged right adrenal gland measuring 8.1 x 10.3 cm, similar to the prior study. There is hyperattenuating peripheral wall with central hypoattenuating areas and intervening hyperattenuating septations. No significant interval change since the prior study. Unremarkable left adrenal gland. No suspicious renal mass. There is a stable partially exophytic 5.2 x 7.9 cm cyst arising from the right kidney upper pole, posteriorly. No hydronephrosis. No renal or ureteric calculi. Unremarkable urinary bladder. Stomach/Bowel: No disproportionate dilation of the small or large bowel loops. No evidence of abnormal bowel wall thickening or inflammatory changes. The appendix was not confidently visualized; however there is no acute inflammatory process in the right lower quadrant. Vascular/Lymphatic: No ascites or pneumoperitoneum. No abdominal or pelvic lymphadenopathy, by size criteria. No aneurysmal dilation of the major abdominal arteries. There are mild peripheral atherosclerotic vascular calcifications of the aorta and its major branches. Reproductive: Normal-size anteverted uterus. Redemonstration of thickened endometrium measuring up to 9 mm, without discrete focal mass. No large adnexal mass. Bilateral fallopian tube closure devices noted. Other: There is a  tiny fat containing umbilical hernia. The soft tissues and abdominal wall are otherwise unremarkable. Musculoskeletal: No suspicious osseous lesions. There are mild multilevel degenerative changes in the visualized spine. Review of the MIP images confirms the above findings. IMPRESSION: 1. No evidence of pulmonary embolism to the proximal subsegmental pulmonary artery level. 2. Interval increase in size of left lung lower lobe mass with resultant postobstructive pneumonia versus atelectasis of the left lung lower lobe. Increasing right pleural effusion. 3. Stable right adrenal mass. 4. Stable thickened endometrium.  No focal mass. 5. Multiple other nonacute observations, as described above. Electronically Signed   By: Jules Schick M.D.   On: 07/06/2023 17:15   CT ABDOMEN PELVIS W CONTRAST  Result Date: 07/06/2023 CLINICAL DATA:  Sepsis; Pulmonary embolism (PE) suspected, high prob. Shortness of breath. Edema. History of lung carcinoma. Urinary incontinence. Burning with urination. * Tracking Code: BO * EXAM: CT ANGIOGRAPHY CHEST CT ABDOMEN AND PELVIS WITH CONTRAST TECHNIQUE: Multidetector CT imaging of the chest was performed using the standard protocol during bolus administration of intravenous contrast. Multiplanar  CT image reconstructions and MIPs were obtained to evaluate the vascular anatomy. Multidetector CT imaging of the abdomen and pelvis was performed using the standard protocol during bolus administration of intravenous contrast. RADIATION DOSE REDUCTION: This exam was performed according to the departmental dose-optimization program which includes automated exposure control, adjustment of the mA and/or kV according to patient size and/or use of iterative reconstruction technique. CONTRAST:  OMNIPAQUE IOHEXOL 350 MG/ML SOLN COMPARISON:  CT scan abdomen and pelvis from 06/11/2023 and CT angiography chest from 05/20/2023. FINDINGS: CTA CHEST FINDINGS Cardiovascular: No evidence of embolism to the  proximal subsegmental pulmonary artery level. Please note, left lung lower lobe pulmonary artery is compressed by patient's pre-existing left lung lower lobe mass however, there is no embolism within. Normal cardiac size. No pericardial effusion. No aortic aneurysm. Mediastinum/Nodes: Visualized thyroid gland appears grossly unremarkable. No solid / cystic mediastinal masses. The esophagus is nondistended precluding optimal assessment. No axillary, mediastinal or hilar lymphadenopathy by size criteria. Lungs/Pleura: The trachea and right bronchial tree is patent. There is abrupt cutoff of left lower lobe bronchial tree. There is near complete collapse of the left lung lower lobe except few small areas of aeration of the superior segment, posteriorly. There is heterogeneous left mass however, accurate measurement is difficult on this exam however, there is significant interval increase in the size when compared to the prior exam from 05/20/2023. There is resultant postobstructive pneumonia versus atelectasis of the left lung lower lobe. There is small right pleural effusion, new since the prior study. There are associated compressive atelectatic changes in the right lung lower lobe. No right lung mass or consolidation. No suspicious lung nodules. Musculoskeletal: A CT Port-a-Cath is seen in the left upper chest wall with the catheter terminating in the cavo-atrial junction region. Visualized soft tissues of the chest wall are otherwise grossly unremarkable. No suspicious osseous lesions. There are mild multilevel degenerative changes in the visualized spine. Review of the MIP images confirms the above findings. CT ABDOMEN and PELVIS FINDINGS Hepatobiliary: The liver is normal in size. Non-cirrhotic configuration. No suspicious mass. No intrahepatic or extrahepatic bile duct dilation. No calcified gallstones. Normal gallbladder wall thickness. No pericholecystic inflammatory changes. Pancreas: Unremarkable. No  pancreatic ductal dilatation or surrounding inflammatory changes. Spleen: Within normal limits. No focal lesion. Adrenals/Urinary Tract: Redemonstration of markedly enlarged right adrenal gland measuring 8.1 x 10.3 cm, similar to the prior study. There is hyperattenuating peripheral wall with central hypoattenuating areas and intervening hyperattenuating septations. No significant interval change since the prior study. Unremarkable left adrenal gland. No suspicious renal mass. There is a stable partially exophytic 5.2 x 7.9 cm cyst arising from the right kidney upper pole, posteriorly. No hydronephrosis. No renal or ureteric calculi. Unremarkable urinary bladder. Stomach/Bowel: No disproportionate dilation of the small or large bowel loops. No evidence of abnormal bowel wall thickening or inflammatory changes. The appendix was not confidently visualized; however there is no acute inflammatory process in the right lower quadrant. Vascular/Lymphatic: No ascites or pneumoperitoneum. No abdominal or pelvic lymphadenopathy, by size criteria. No aneurysmal dilation of the major abdominal arteries. There are mild peripheral atherosclerotic vascular calcifications of the aorta and its major branches. Reproductive: Normal-size anteverted uterus. Redemonstration of thickened endometrium measuring up to 9 mm, without discrete focal mass. No large adnexal mass. Bilateral fallopian tube closure devices noted. Other: There is a tiny fat containing umbilical hernia. The soft tissues and abdominal wall are otherwise unremarkable. Musculoskeletal: No suspicious osseous lesions. There are mild multilevel degenerative changes  in the visualized spine. Review of the MIP images confirms the above findings. IMPRESSION: 1. No evidence of pulmonary embolism to the proximal subsegmental pulmonary artery level. 2. Interval increase in size of left lung lower lobe mass with resultant postobstructive pneumonia versus atelectasis of the left  lung lower lobe. Increasing right pleural effusion. 3. Stable right adrenal mass. 4. Stable thickened endometrium.  No focal mass. 5. Multiple other nonacute observations, as described above. Electronically Signed   By: Jules Schick M.D.   On: 07/06/2023 17:15   DG Chest Portable 1 View  Result Date: 07/06/2023 CLINICAL DATA:  Cough and swelling EXAM: PORTABLE CHEST 1 VIEW COMPARISON:  X-ray 06/24/2023 FINDINGS: Left upper chest port. Tip along the central SVC. Stable cardiopericardial silhouette. No pneumothorax or effusion. Developing left retrocardiac opacity. New infiltrates possible. Stable left perihilar mass and possible additional nodular area in the medial right lung base as on prior. Please correlate with prior workup. Overlapping cardiac leads. Fixation hardware along the lower cervical spine at the edge of the imaging field. IMPRESSION: Developing left retrocardiac opacity.  Possible new infiltrate. Stable left perihilar lung mass with chest port Electronically Signed   By: Karen Kays M.D.   On: 07/06/2023 16:38   DG Chest Port 1 View  Result Date: 06/24/2023 CLINICAL DATA:  Port-A-Cath placement EXAM: PORTABLE CHEST 1 VIEW COMPARISON:  06/14/2023 FINDINGS: Left Port-A-Cath tip at low SVC 2 superior caval/atrial junction. Right hemidiaphragm elevation.  Cervical spine fixation. Patient rotated left. Midline trachea. Normal heart size. No pleural effusion or pneumothorax. Left perihilar mass measures 6.1 cm today versus 5.5 cm on 06/14/2023. IMPRESSION: Left Port-A-Cath terminates at the low SVC or superior caval/atrial junction, without pneumothorax. Left perihilar mass measures slightly larger today than on 06/04/2023. Difference is favored to be due to mild obliquity. Electronically Signed   By: Jeronimo Greaves M.D.   On: 06/24/2023 19:10   DG C-Arm 1-60 Min-No Report  Result Date: 06/24/2023 Fluoroscopy was utilized by the requesting physician.  No radiographic interpretation.   DG Abd 1  View  Result Date: 06/21/2023 CLINICAL DATA:  Small bowel obstruction. EXAM: ABDOMEN - 1 VIEW COMPARISON:  June 20, 2023. FINDINGS: No abnormal bowel dilatation is noted. Surgical clips are noted in the pelvis. IMPRESSION: No abnormal bowel dilatation. Electronically Signed   By: Lupita Raider M.D.   On: 06/21/2023 09:53   CT ABDOMEN PELVIS W CONTRAST  Result Date: 06/20/2023 CLINICAL DATA:  Abdominal pain for several weeks. History of lung cancer. * Tracking Code: BO * EXAM: CT ABDOMEN AND PELVIS WITH CONTRAST TECHNIQUE: Multidetector CT imaging of the abdomen and pelvis was performed using the standard protocol following bolus administration of intravenous contrast. RADIATION DOSE REDUCTION: This exam was performed according to the departmental dose-optimization program which includes automated exposure control, adjustment of the mA and/or kV according to patient size and/or use of iterative reconstruction technique. CONTRAST:  OMNIPAQUE IOHEXOL 300 MG/ML  SOLN COMPARISON:  06/11/2023 FINDINGS: Lower chest: Rapidly enlarging mass within the left hilar region is identified. This now measures at least 5.9 x 5.0 cm, image 9/4. On the PET-CT from 05/27/23 this measured 5.7 x 3.5 cm. New small right pleural effusion. Hepatobiliary: There is no suspicious liver abnormality. The gallbladder appears normal. No bile duct dilatation. Pancreas: Unremarkable. Spleen: No focal splenic abnormality. Spleen measures 12.6 cm cranial caudal Adrenals/Urinary Tract: Large solid and cystic mass centered around the right adrenal gland measures 10.3 by 7.9 by 13.7, image 22/2. On  06/11/2023 10.0 by 7.4 by 13.1 cm. Normal left adrenal gland. The kidneys appear unremarkable. No signs of nephrolithiasis, hydronephrosis or mass. Urinary bladder appears normal. Stomach/Bowel: Stomach appears within normal limits. Progressive distension of the small bowel measure up to 2.9 cm with a few air-fluid levels. The distal small bowel  loops have a normal caliber up to the ileocecal valve. No bowel wall thickening or inflammation. Normal caliber of the colon. Vascular/Lymphatic: Aortic atherosclerosis. Aortocaval lymph node measures 1 cm, image 33/2. No pelvic or inguinal adenopathy. Reproductive: Small fibroid arises off the subserosal right uterine corpus measuring 2.1 cm, image 79/2. The endometrial stripe measures 9 mm in thickness which is considered abnormal in a postmenopausal female. No adnexal mass. Other: No significant free fluid or fluid collections. Musculoskeletal: No acute or significant osseous findings. IMPRESSION: 1. Progressive distension of the small bowel measure up to 2.9 cm with a few air-fluid levels. The distal small bowel loops have a normal caliber up to the ileocecal valve. Although nonspecific these findings may reflect developing small bowel ileus or early small-bowel obstruction. Consider serial follow-up radiographs of the abdomen. 2. Enlarging mass within the left hilar region is identified. The visualized portions in the left lower lobe measures 5.9 x 5.0 cm. On the PET-CT from 05/27/23 the perihilar portion measured up to 5.7 x 3.5 cm. 3. 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 On 06/11/2023 10.0 by 7.4 by 13.1 cm. 4. New small right pleural effusion. 5. Endometrial stripe measures 9 mm in thickness which is considered abnormal in a postmenopausal female. Consider further evaluation with pelvic ultrasound. 6.  Aortic Atherosclerosis (ICD10-I70.0). Electronically Signed   By: Signa Kell M.D.   On: 06/20/2023 17:50   MR Brain W Wo Contrast  Result Date: 06/16/2023 CLINICAL DATA:  Provided history: Malignant neoplasm of unspecified part of unspecified bronchus or lung. Non-small cell lung cancer, staging. EXAM: MRI HEAD WITHOUT AND WITH CONTRAST TECHNIQUE: Multiplanar, multiecho pulse sequences of the brain and surrounding structures were obtained without and  with intravenous contrast. CONTRAST:  10mL GADAVIST GADOBUTROL 1 MMOL/ML IV SOLN COMPARISON:  Head CT 04/09/2016. FINDINGS: Brain: Cerebral volume is normal. 2 mm focus of enhancement along the right central sulcus which is indeterminate for a small cortical metastasis versus vascular enhancement (series 16, image 34). Small T2 hyperintense foci within the left thalamus and left lentiform nucleus, which may reflect prominent perivascular spaces or chronic lacunar infarcts. There are a few small foci of T2 FLAIR hyperintense signal abnormality within the cerebral white matter, nonspecific but compatible with minimal changes of chronic small vessel ischemia. There is no acute infarct. No chronic intracranial blood products. No extra-axial fluid collection. No midline shift. Vascular: Maintained flow voids within the proximal large arterial vessels. Small developmental venous anomaly within the anterior right frontal lobe (anatomic variant). Skull and upper cervical spine: No focal suspicious marrow lesion. Susceptibility artifact arising from ACDF hardware. Sinuses/Orbits: No mass or acute finding within the imaged orbits. No significant paranasal sinus disease. Impression #1 will be called to the ordering clinician or representative by the Radiologist Assistant, and communication documented in the PACS or [employer]. IMPRESSION: 1. 2 mm focus of enhancement along the right central sulcus, which is indeterminate for a small cortical metastasis versus vascular enhancement. A short-interval follow-up brain MRI (with and without contrast) is recommended in 6-8 weeks. 2. Small T2 hyperintense foci within the deep gray nuclei on the left, which may reflect  prominent perivascular spaces or chronic lacunar infarcts. 3. Minimal chronic small vessel ischemic changes within the cerebral white matter. Electronically Signed   By: Jackey Loge D.O.   On: 06/16/2023 17:40   DG Chest Port 1 View  Result Date:  06/14/2023 CLINICAL DATA:  Status post bronchoscopy and biopsy EXAM: PORTABLE CHEST 1 VIEW COMPARISON:  05/07/2023 FINDINGS: Normal heart size. Left hilar mass. No acute abnormality of the lungs following bronchoscopy. The visualized skeletal structures are unremarkable. IMPRESSION: No acute abnormality of the lungs status post bronchoscopy. Left hilar mass. Electronically Signed   By: Jearld Lesch M.D.   On: 06/14/2023 13:11   DG C-ARM BRONCHOSCOPY  Result Date: 06/14/2023 C-ARM BRONCHOSCOPY: Fluoroscopy was utilized by the requesting physician.  No radiographic interpretation.   CT ABDOMEN PELVIS W CONTRAST  Result Date: 06/11/2023 CLINICAL DATA:  62 year old female with history of abdominal pain. Nausea and vomiting for the past 6 weeks. Additional history of lung cancer. * Tracking Code: BO * EXAM: CT ABDOMEN AND PELVIS WITH CONTRAST TECHNIQUE: Multidetector CT imaging of the abdomen and pelvis was performed using the standard protocol following bolus administration of intravenous contrast. RADIATION DOSE REDUCTION: This exam was performed according to the departmental dose-optimization program which includes automated exposure control, adjustment of the mA and/or kV according to patient size and/or use of iterative reconstruction technique. CONTRAST:  75mL OMNIPAQUE IOHEXOL 350 MG/ML SOLN COMPARISON:  PET-CT 05/27/2023. CT of the abdomen and pelvis 05/20/2023. FINDINGS: Lower chest: Trace right pleural effusion lying dependently. Hepatobiliary: No definite suspicious cystic or solid hepatic lesions. No intra or extrahepatic biliary ductal dilatation. Gallbladder is unremarkable in appearance. Pancreas: No pancreatic mass. No pancreatic ductal dilatation. No pancreatic or peripancreatic fluid collections or inflammatory changes. Spleen: Unremarkable. Adrenals/Urinary Tract: Large heterogeneous appearing mixed cystic and solid right adrenal mass (axial image 25 of series 3 and coronal image 64 of series  6) which currently measures 10.6 x 7.2 x 14.6 cm, which exerts mass effect upon adjacent structures displacing the right lobe of the liver anteriorly and superiorly, and making broad contact with the upper pole of the right kidney which is mildly distorted by the adrenal mass. Left kidney and left adrenal gland are normal in appearance. No hydroureteronephrosis. Urinary bladder is unremarkable in appearance. Stomach/Bowel: The appearance of the stomach is normal. There is no pathologic dilatation of small bowel or colon. Numerous colonic diverticuli are noted, without surrounding inflammatory changes to suggest an acute diverticulitis at this time. The appendix is not confidently identified and may be surgically absent. Regardless, there are no inflammatory changes noted adjacent to the cecum to suggest the presence of an acute appendicitis at this time. Vascular/Lymphatic: Atherosclerosis in the abdominal aorta and pelvic vasculature. No lymphadenopathy noted in the abdomen or pelvis. Reproductive: Tubal ligation clips are noted bilaterally. Uterus and ovaries are otherwise unremarkable in appearance. Other: No significant volume of ascites.  No pneumoperitoneum. Musculoskeletal: There are no aggressive appearing lytic or blastic lesions noted in the visualized portions of the skeleton. IMPRESSION: 1. No definite acute findings are noted in the abdomen or pelvis. 2. Large right adrenal metastasis redemonstrated, slightly increased in size compared to prior studies, most compatible with a metastatic lesion. 3. Trace right pleural effusion lying dependently. 4. Aortic atherosclerosis. 5. Additional incidental findings, as above. Electronically Signed   By: Trudie Reed M.D.   On: 06/11/2023 05:20    Catarina Hartshorn, DO  [facility]  If 7PM-7AM, please contact night-coverage [email or web address] Password [email or web address] 07/07/2023, 8:25  AM   LOS: 1 day

## 2023-07-07 NOTE — Plan of Care (Signed)

## 2023-07-07 NOTE — Progress Notes (Signed)
Pharmacy Antibiotic Note  Miranda Perkins is a 62 y.o. female admitted on 07/06/2023 with sepsis.  Pharmacy has been consulted for Cefepime and Vancomycin dosing. Patient has tolerated ceftriaxone before in May of 2024, okay with MD to switch to cefepime  Plan: Vancomycin  1500mg  IV Q 24 hrs. Goal AUC 400-550. Expected AUC: 489 SCr used: 0.8( actual 0.75) Cefepime 2gm IV q8h F/U cxs and clinical progress Monitor V/S, labs and levels as indicate    Height: 5\' 6"  (167.6 cm) Weight: 106.6 kg (235 lb 1.6 oz) IBW/kg (Calculated) : 59.3  Temp (24hrs), Avg:99.2 F (37.3 C), Min:98.2 F (36.8 C), Max:102.2 F (39 C)  Recent Labs  Lab 07/01/23 1305 07/06/23 1305 07/06/23 1443 07/06/23 1607 07/07/23 0436  WBC 12.0* 12.2*  --   --  9.9  CREATININE 0.60 0.75  --   --  0.59  LATICACIDVEN  --   --  2.2* 1.4  --     Estimated Creatinine Clearance: 87.7 mL/min (by C-G formula based on SCr of 0.59 mg/dL).    Allergies  Allergen Reactions   Influenza Vaccines Anaphylaxis and Other (See Comments)    Per patient   Penicillins Anaphylaxis and Other (See Comments)   Articaine Other (See Comments)    Caused infection   Cortisone Other (See Comments)    Turned red and ran a low grade fever for 3 days   Other Rash    bandaids- skin turns red and a rash     Antimicrobials this admission: Vancomycin 9/3 >>  Aztreonam 9/3 >>   Microbiology results: 9/3 BCx: pending  MRSA PCR:   Thank you for allowing pharmacy to be a part of this patient's care.  Elder Cyphers, BS Pharm D, BCPS Clinical Pharmacist 07/07/2023 11:38 AM

## 2023-07-07 NOTE — Hospital Course (Addendum)
62 y.o. female with medical history significant for metastatic lung adenocarcinom , CVA, adrenal hemorrhage, and c diff colitis presents with 4 days of sob and cough.  The patient had some fevers and chills.  She stated her temperature was up to 99.9 F.  She has had some posttussive emesis at home.  In addition, she has been complaining of increasing shortness of breath over the past 4 days with lower extremity edema.  She has had a small amount of blood-tinged sputum.  Patient was recently hospitalized 8/18 to 8/24 for persistent diarrhea from C. difficile infection after she had completed 10 days of Dificid.  On discharge symptoms solved, and she was discharged home to complete 10 days of oral vancomycin   She continues to have some vaginal bleeding.  She states that her dose of furosemide was recently increased to 40 mg daily.  She continues to have lower extremity edema.  ED Course: Tmax of 102.2, initial tachycardia to 135 improved after fluids now low 110s.  Respirate rate 18-22.  Blood pressure systolic 98-115.  O2 sats 93 to 95% on room air.  WBC 12.  Lactic acid 2.2 > 1.4.  COVID test negative. CTA chest negative for PE,  Interval increase in size of left lung lower lobe mass with resultant postobstructive pneumonia versus atelectasis of the left lung lower lobe. Increasing right pleural effusion. Vancomycin, aztreonam and metronidazole started. MRSA was negative.  Vanco was discontinued and she was transitioned to cefepime and metronidazole which she tolerated. Overall, the patient improved clinically with improving respiratory status.  She remained stable on room air.  She stated that her dyspnea gradually improved.  The patient also had nausea and vomiting.  She was started on antiemetics.  Gradually this improved and she was able to tolerate a diet.  Medical oncology was consulted.  Dr. Ellin Saba saw the patient.  He recommended continuing to optimize the patient medically.  She will  follow-up with him in the office to start immunotherapy the week following her discharge.

## 2023-07-07 NOTE — Progress Notes (Signed)
Pt is showing a yellow 3 MEWS calculated from the ECG monitor. This is not a true MEWS. Pt lead became detached moving from bed to [facility]. Charge Nurse Glenda Chroman RN made aware,

## 2023-07-07 NOTE — Progress Notes (Signed)
   07/06/23 1904  Vitals  Temp 98.9 F (37.2 C)  Temp Source Oral  BP 115/67  MAP (mmHg) 82  BP Location Left Arm  BP Method Automatic  Patient Position (if appropriate) Lying  Pulse Rate (!) 113  Pulse Rate Source Dinamap  Resp 20  Level of Consciousness  Level of Consciousness Alert  MEWS COLOR  MEWS Score Color Yellow  Oxygen Therapy  SpO2 94 %  O2 Device Room Air  MEWS Score  MEWS Temp 0  MEWS Systolic 0  MEWS Pulse 2  MEWS RR 0  MEWS LOC 0  MEWS Score 2  Provider Notification  Provider Name/Title Dr. Mariea Clonts  Date Provider Notified 07/06/23  Method of Notification Page  Notification Reason Other (Comment) (yellow mews)  Provider response No new orders  Date of Provider Response 07/06/23

## 2023-07-07 NOTE — TOC Progression Note (Signed)
Transition of Care [facility]) - Progression Note    Patient Details  Name: Miranda Perkins MRN: [id number] Date of Birth: [birth year]  [facility] CM/SW Contact  Leitha Bleak, RN Phone Number: 07/07/2023, 2:21 PM  Clinical Narrative:   Morrie Sheldon with [facility] accepted the HHPT referral. MD aware to order. Added to AVS.    Expected Discharge Plan: Home w Home Health Services Barriers to Discharge: Continued Medical Work up  Expected Discharge Plan and Services     Post Acute Care Choice: Home Health                             HH Arranged: PT [facility] Agency: [facility] ([facility] Date HH Agency Contacted: 07/07/23 Time HH Agency Contacted: 1421 Representative spoke with at [facility] Agency: Morrie Sheldon   Social Determinants of Health (SDOH) Interventions SDOH Screenings   Food Insecurity: No Food Insecurity (07/06/2023)  Housing: Low Risk  (07/06/2023)  Transportation Needs: No Transportation Needs (07/06/2023)  Utilities: Not At Risk (07/06/2023)  Alcohol Screen: Low Risk  (04/27/2023)  Depression (PHQ2-9): Low Risk  (04/15/2023)  Financial Resource Strain: Low Risk  (06/09/2023)  Physical Activity: Inactive (06/09/2023)  Social Connections: Unknown (06/09/2023)  Recent Concern: Social Connections - Moderately Isolated (04/27/2023)  Stress: Stress Concern Present (06/09/2023)  Tobacco Use: Medium Risk (07/06/2023)    Readmission Risk Interventions    07/07/2023    1:50 PM  Readmission Risk Prevention Plan  Transportation Screening Complete  PCP or Specialist Appt within 3-5 Days Not Complete  HRI or Home Care Consult Complete  Social Work Consult for Recovery Care Planning/Counseling Complete  Palliative Care Screening Complete  Medication Review Oceanographer) Complete

## 2023-07-08 ENCOUNTER — Encounter (HOSPITAL_COMMUNITY): Payer: Self-pay | Admitting: Hematology

## 2023-07-08 ENCOUNTER — Inpatient Hospital Stay: Admitting: Licensed Clinical Social Worker

## 2023-07-08 ENCOUNTER — Inpatient Hospital Stay (HOSPITAL_COMMUNITY)

## 2023-07-08 ENCOUNTER — Inpatient Hospital Stay: Attending: Hematology

## 2023-07-08 ENCOUNTER — Other Ambulatory Visit (HOSPITAL_COMMUNITY): Payer: Self-pay | Admitting: Hematology

## 2023-07-08 DIAGNOSIS — C3492 Malignant neoplasm of unspecified part of left bronchus or lung: Secondary | ICD-10-CM

## 2023-07-08 DIAGNOSIS — R600 Localized edema: Secondary | ICD-10-CM

## 2023-07-08 DIAGNOSIS — A419 Sepsis, unspecified organism: Secondary | ICD-10-CM

## 2023-07-08 DIAGNOSIS — R6520 Severe sepsis without septic shock: Secondary | ICD-10-CM

## 2023-07-08 DIAGNOSIS — J189 Pneumonia, unspecified organism: Secondary | ICD-10-CM | POA: Diagnosis not present

## 2023-07-08 DIAGNOSIS — M7989 Other specified soft tissue disorders: Secondary | ICD-10-CM

## 2023-07-08 LAB — COMPREHENSIVE METABOLIC PANEL
ALT: 26 U/L (ref 0–44)
AST: 15 U/L (ref 15–41)
Albumin: 1.9 g/dL — ABNORMAL LOW (ref 3.5–5.0)
Alkaline Phosphatase: 121 U/L (ref 38–126)
Anion gap: 10 (ref 5–15)
BUN: 10 mg/dL (ref 8–23)
CO2: 24 mmol/L (ref 22–32)
Calcium: 8 mg/dL — ABNORMAL LOW (ref 8.9–10.3)
Chloride: 101 mmol/L (ref 98–111)
Creatinine, Ser: 0.52 mg/dL (ref 0.44–1.00)
GFR, Estimated: >60 mL/min (ref >60)
Glucose, Bld: 97 mg/dL (ref 70–99)
Potassium: 3.5 mmol/L (ref 3.5–5.1)
Sodium: 135 mmol/L (ref 135–145)
Total Bilirubin: 0.6 mg/dL (ref 0.3–1.2)
Total Protein: 5.1 g/dL — ABNORMAL LOW (ref 6.5–8.1)

## 2023-07-08 LAB — CBC
HCT: 30.7 % — ABNORMAL LOW (ref 36.0–46.0)
Hemoglobin: 9.3 g/dL — ABNORMAL LOW (ref 12.0–15.0)
MCH: 25.2 pg — ABNORMAL LOW (ref 26.0–34.0)
MCHC: 30.3 g/dL (ref 30.0–36.0)
MCV: 83.2 fL (ref 80.0–100.0)
Platelets: 411 10*3/uL — ABNORMAL HIGH (ref 150–400)
RBC: 3.69 MIL/uL — ABNORMAL LOW (ref 3.87–5.11)
RDW: 16.5 % — ABNORMAL HIGH (ref 11.5–15.5)
WBC: 9.3 10*3/uL (ref 4.0–10.5)
nRBC: 0 % (ref 0.0–0.2)

## 2023-07-08 LAB — ECHOCARDIOGRAM COMPLETE
AR max vel: 2.03 cm2
AV Area VTI: 2.31 cm2
AV Area mean vel: 2 cm2
AV Mean grad: 6.9 mmHg
AV Peak grad: 12.6 mmHg
Ao pk vel: 1.77 m/s
Area-P 1/2: 4.29 cm2
Est EF: >75
Height: 66 in
MV M vel: 1.11 m/s
MV Peak grad: 4.9 mmHg
S' Lateral: 2.4 cm
Weight: 3798.97 [oz_av]

## 2023-07-08 LAB — PROCALCITONIN: Procalcitonin: 4.44 ng/mL

## 2023-07-08 MED ORDER — LACTATED RINGERS IV SOLN: INTRAVENOUS | Status: AC

## 2023-07-08 MED ORDER — LACTATED RINGERS IV BOLUS
1000.0000 mL | Freq: Once | INTRAVENOUS | Status: AC
  Administered 2023-07-08: 1000 mL via INTRAVENOUS

## 2023-07-08 NOTE — Progress Notes (Signed)
PROGRESS NOTE  Miranda Perkins YQI:[id number] DOB: 1961-01-16 DOA: 07/06/2023 PCP: [name], DO  Brief History:   62 y.o. female with medical history significant for metastatic lung adenocarcinom , CVA, adrenal hemorrhage, and c diff colitis presents with 4 days of sob and cough.  The patient had some fevers and chills.  She stated her temperature was up to 99.9 F.  She has had some posttussive emesis at home.  In addition, she has been complaining of increasing shortness of breath over the past 4 days with lower extremity edema.  She has had a small amount of blood-tinged sputum.  Patient was recently hospitalized 8/18 to 8/24 for persistent diarrhea from C. difficile infection after she had completed 10 days of Dificid.  On discharge symptoms solved, and she was discharged home to complete 10 days of oral vancomycin   She continues to have some vaginal bleeding.  She states that her dose of furosemide was recently increased to 40 mg daily.  She continues to have lower extremity edema.  ED Course: Tmax of 102.2, initial tachycardia to 135 improved after fluids now low 110s.  Respirate rate 18-22.  Blood pressure systolic 98-115.  O2 sats 93 to 95% on room air.  WBC 12.  Lactic acid 2.2 > 1.4.  COVID test negative. CTA chest negative for PE,  Interval increase in size of left lung lower lobe mass with resultant postobstructive pneumonia versus atelectasis of the left lung lower lobe. Increasing right pleural effusion. Vancomycin, aztreonam and metronidazole started.   Assessment/Plan: Lobar pneumonia/postobstructive pneumonia -07/06/2023 CTA chest negative for PE; interval increased in LLL mass with resultant postobstructive PNA versus atelectasis -initially started vancomycin, aztreonam, metronidazole -CT abd/ pelvis-Stable right adrenal mass; Stable thickening of the endometrium with no focal mass -Check PCT 4/81>>4.44 -d/c vancomycin (MRSA neg) -tolerating cefepime and  metronidazole   Severe sepsis Presented with fever, tachycardia, leukocytosis -Lactic acid 2.2>> 1.4 -Follow blood cultures--neg to date -continue cefepime and metronidazole   Recent history of C. difficile colitis -Restarted oral vancomycin while on antibiotics for pneumonia   Lower extremity edema -Likely secondary to low albumin -Albumin has dropped from 4.6-2.3 in the last 2 months -Venous duplex--neg for DVT -Hold furosemide temporarily -9/5 echo EF >75%, no WMA, normal RVF; IVC small suggesting hypovolemia   Hypokalemia -Replete -Magnesium 2.0   Metastatic primary lung cancer, left (HCC) Follows with Dr. 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  Just completed 5 treatments of radiation therapy on adrenal mass on 07/02/23.  --appreciate Dr. Ellin Saba consult>>treat PNA and plan to start immunotherapy next week;  hold off additional XRT for now -discussed with Dr. Ellin Saba                   Family Communication:  no  Family at bedside   Consultants:  none   Code Status:  FULL    DVT Prophylaxis:  [place] Lovenox     Procedures: As Listed in Progress Note Above   Antibiotics: Vanc IV 9/3>>9/4 Aztreonam 9/3>>9/4 Cefepime 9/4>> Metronidazole 9/3>>         Subjective: Pt states she is breathing a little better.  Has some dry heaves after cough.  Denies abd pain, diarrhea, cp, f/c  Objective: Vitals:   07/08/23 0355 07/08/23 1006 07/08/23 1235 07/08/23 1448  BP: 111/71 126/64 111/72 (!) 112/43  Pulse: 100 [name])  105 (!) 109 (!) 110  Resp:      Temp: 98.9 F (37.2 C) 98.4 F (36.9 C) 99.4 F (37.4 C) 98.8 F (37.1 C)  TempSrc: Oral Oral Oral Oral  SpO2: 95% 96% 96% 100%  Weight: 107.7 kg     Height:        Intake/Output Summary (Last 24 hours) at 07/08/2023 1725 Last data filed at 07/08/2023 1300 Gross per 24 hour  Intake  1419.27 ml  Output --  Net 1419.27 ml   Weight change: 4.734 kg Exam:  General:  Pt is alert, follows commands appropriately, not in acute distress HEENT: No icterus, No thrush, No neck mass, [place]/AT Cardiovascular: RRR, S1/S2, no rubs, no gallops Respiratory: bibasilar crackles.  No wheeze Abdomen: Soft/+BS, non tender, non distended, no guarding Extremities: 2 LE edema, No lymphangitis, No petechiae, No rashes, no synovitis   Data Reviewed: I have personally reviewed following labs and imaging studies Basic Metabolic Panel: Recent Labs  Lab 07/06/23 1305 07/07/23 0436 07/08/23 0430  NA 133* 133* 135  K 3.5 3.1* 3.5  CL 97* 98 101  CO2 24 25 24   GLUCOSE 166* 104* 97  BUN 14 11 10   CREATININE 0.75 0.59 0.52  CALCIUM 8.2* 7.9* 8.0*  MG 2.0  --   --    Liver Function Tests: Recent Labs  Lab 07/06/23 1305 07/08/23 0430  AST 66* 15  ALT 53* 26  ALKPHOS 168* 121  BILITOT 0.8 0.6  PROT 6.3* 5.1*  ALBUMIN 2.3* 1.9*   No results for input(s): "LIPASE", "AMYLASE" in the last 168 hours. No results for input(s): "AMMONIA" in the last 168 hours. Coagulation Profile: Recent Labs  Lab 07/06/23 1443  INR 1.3*   CBC: Recent Labs  Lab 07/06/23 1305 07/07/23 0436 07/08/23 0430  WBC 12.2* 9.9 9.3  NEUTROABS 10.9*  --   --   HGB 11.7* 9.6* 9.3*  HCT 37.7 30.8* 30.7*  MCV 81.1 81.7 83.2  PLT 527* 416* 411*   Cardiac Enzymes: No results for input(s): "CKTOTAL", "CKMB", "CKMBINDEX", "TROPONINI" in the last 168 hours. BNP: Invalid input(s): "POCBNP" CBG: No results for input(s): "GLUCAP" in the last 168 hours. HbA1C: No results for input(s): "HGBA1C" in the last 72 hours. Urine analysis:    Component Value Date/Time   COLORURINE YELLOW 06/11/2023 0526   APPEARANCEUR CLEAR 06/11/2023 0526   APPEARANCEUR Clear 04/06/2023 1106   LABSPEC >1.046 (H) 06/11/2023 0526   PHURINE 7.0 06/11/2023 0526   GLUCOSEU NEGATIVE 06/11/2023 0526   HGBUR NEGATIVE 06/11/2023 0526    BILIRUBINUR NEGATIVE 06/11/2023 0526   BILIRUBINUR Negative 04/06/2023 1106   KETONESUR 20 (A) 06/11/2023 0526   PROTEINUR NEGATIVE 06/11/2023 0526   NITRITE NEGATIVE 06/11/2023 0526   LEUKOCYTESUR NEGATIVE 06/11/2023 0526   Sepsis Labs: @LABRCNTIP (procalcitonin:4,lacticidven:4) ) Recent Results (from the past 240 hour(s))  SARS Coronavirus 2 by RT PCR (hospital order, performed in [facility] hospital lab) *cepheid single result test* Anterior Nasal Swab     Status: None   Collection Time: 07/06/23  2:20 PM   Specimen: Anterior Nasal Swab  Result Value Ref Range Status   SARS Coronavirus 2 by RT PCR NEGATIVE NEGATIVE Final    Comment: (NOTE) SARS-CoV-2 target nucleic acids are NOT DETECTED.  The SARS-CoV-2 RNA is generally detectable in upper and lower respiratory specimens during the acute phase of infection. The lowest concentration of SARS-CoV-2 viral copies this assay can detect is 250 copies / mL. A negative result does not preclude SARS-CoV-2  infection and should not be used as the sole basis for treatment or other patient management decisions.  A negative result may occur with improper specimen collection / handling, submission of specimen other than nasopharyngeal swab, presence of viral mutation(s) within the areas targeted by this assay, and inadequate number of viral copies (<250 copies / mL). A negative result must be combined with clinical observations, patient history, and epidemiological information.  Fact Sheet for Patients:   RoadLapTop.co.za  Fact Sheet for Healthcare Providers: http://kim-miller.com/  This test is not yet approved or  cleared by the [place] FDA and has been authorized for detection and/or diagnosis of SARS-CoV-2 by FDA under an Emergency Use Authorization (EUA).  This EUA will remain in effect (meaning this test can be used) for the duration of the COVID-19 declaration under Section  564(b)(1) of the Act, 21 U.S.C. section 360bbb-3(b)(1), unless the authorization is terminated or revoked sooner.  Performed at [facility], [address], [place], [place] [address]   Blood Culture (routine x 2)     Status: None (Preliminary result)   Collection Time: 07/06/23  2:43 PM   Specimen: BLOOD  Result Value Ref Range Status   Specimen Description BLOOD BLOOD RIGHT ARM  Final   Special Requests   Final    BOTTLES DRAWN AEROBIC AND ANAEROBIC Blood Culture adequate volume   Culture   Final    NO GROWTH 2 DAYS Performed at [facility], [address], [place], [place] [address]    Report Status PENDING  Incomplete  Blood Culture (routine x 2)     Status: None (Preliminary result)   Collection Time: 07/06/23  2:43 PM   Specimen: BLOOD  Result Value Ref Range Status   Specimen Description BLOOD BLOOD LEFT ARM  Final   Special Requests   Final    BOTTLES DRAWN AEROBIC AND ANAEROBIC Blood Culture adequate volume   Culture   Final    NO GROWTH 2 DAYS Performed at [facility], [address], [place], [place] [address]    Report Status PENDING  Incomplete  MRSA Next Gen by PCR, Nasal     Status: None   Collection Time: 07/07/23  8:58 AM   Specimen: Nasal Mucosa; Nasal Swab  Result Value Ref Range Status   MRSA by PCR Next Gen NOT DETECTED NOT DETECTED Final    Comment: (NOTE) The GeneXpert MRSA Assay (FDA approved for NASAL specimens only), is one component of a comprehensive MRSA colonization surveillance program. It is not intended to diagnose MRSA infection nor to guide or monitor treatment for MRSA infections. Test performance is not FDA approved in patients less than 90 years old. Performed at [facility], [address] [place], [place] [address]      Scheduled Meds:  Chlorhexidine Gluconate Cloth  6 each Topical Daily   enoxaparin (LOVENOX) injection  50 mg Subcutaneous Q24H   folic acid  1 mg Oral Daily   melatonin  6 mg Oral QHS   progesterone  200 mg  Oral QHS   saccharomyces boulardii  250 mg Oral BID   vancomycin  125 mg Oral QID   Continuous Infusions:  ceFEPime (MAXIPIME) IV 2 g (07/08/23 1351)   metronidazole 500 mg (07/08/23 1451)    Procedures/Studies: ECHOCARDIOGRAM COMPLETE  Result Date: 07/08/2023    ECHOCARDIOGRAM REPORT   Patient Name:   TIONNI BAAB Orrego Date of Exam: 07/08/2023 Medical Rec #:  [id number]          Height:  66.0 in Accession #:    [id number]         Weight:       237.4 lb Date of Birth:  Jan 28, 1961          BSA:          2.151 m Patient Age:    62 years           BP:           11/71 mmHg Patient Gender: F                  HR:           103 bpm. [facility] Procedure: 2D Echo, Cardiac Doppler and Color Doppler Indications:    Swelling of lower extremity [[id number]]  History:        Patient has prior history of Echocardiogram examinations, most                 recent 01/24/2021. Lung Cancer and Stroke; Signs/Symptoms:Edema.                 Sepsis.  Sonographer:    Aron Baba Referring Phys: [id number] Onnie Boer  Sonographer Comments: Patient is obese. Image acquisition challenging due to respiratory motion. IMPRESSIONS  1. Left ventricular ejection fraction, by estimation, is >75%. The left ventricle has hyperdynamic function. The left ventricle has no regional wall motion abnormalities. Left ventricular diastolic parameters are indeterminate.  2. Right ventricular systolic function is normal. The right ventricular size is mildly enlarged. There is mildly elevated pulmonary artery systolic pressure.  3. The mitral valve is normal in structure. No evidence of mitral valve regurgitation. No evidence of mitral stenosis.  4. The tricuspid valve is abnormal.  5. The aortic valve is tricuspid. Aortic valve regurgitation is not visualized. No aortic stenosis is present.  6. IVC is small suggesting low RA pressure and hypovolemia. FINDINGS  Left Ventricle: Hyperdynamic LV function creates a mild dynamic LVOT  gradient, peak gradient 15 mmHg. Left ventricular ejection fraction, by estimation, is >75%. The left ventricle has hyperdynamic function. The left ventricle has no regional wall motion abnormalities. The left ventricular internal cavity size was normal in size. There is no left ventricular hypertrophy. Left ventricular diastolic parameters are indeterminate. Right Ventricle: The right ventricular size is mildly enlarged. Right vetricular wall thickness was not well visualized. Right ventricular systolic function is normal. There is mildly elevated pulmonary artery systolic pressure. The tricuspid regurgitant  velocity is 2.87 m/s, and with an assumed right atrial pressure of 8 mmHg, the estimated right ventricular systolic pressure is 40.9 mmHg. Left Atrium: Left atrial size was normal in size. Right Atrium: Right atrial size was normal in size. Pericardium: There is no evidence of pericardial effusion. Mitral Valve: The mitral valve is normal in structure. No evidence of mitral valve regurgitation. No evidence of mitral valve stenosis. Tricuspid Valve: The tricuspid valve is abnormal. Tricuspid valve regurgitation is mild . No evidence of tricuspid stenosis. Aortic Valve: The aortic valve is tricuspid. Aortic valve regurgitation is not visualized. No aortic stenosis is present. Aortic valve mean gradient measures 6.9 mmHg. Aortic valve peak gradient measures 12.6 mmHg. Aortic valve area, by VTI measures 2.31  cm. Pulmonic Valve: The pulmonic valve was not well visualized. Pulmonic valve regurgitation is not visualized. No evidence of pulmonic stenosis. Aorta: The aortic root is normal in size and structure. Venous: IVC is small suggesting low RA pressure and hypovolemia. IAS/Shunts: No atrial  level shunt detected by color flow Doppler.  LEFT VENTRICLE PLAX 2D LVIDd:         4.20 cm   Diastology LVIDs:         2.40 cm   LV e' medial:    9.95 cm/s LV PW:         0.90 cm   LV E/e' medial:  7.4 LV IVS:        0.90 cm    LV e' lateral:   13.50 cm/s LVOT diam:     1.90 cm   LV E/e' lateral: 5.4 LV SV:         61 LV SV Index:   28 LVOT Area:     2.84 cm  RIGHT VENTRICLE RV S prime:     26.70 cm/s TAPSE (M-mode): 2.4 cm LEFT ATRIUM           Index        RIGHT ATRIUM           Index LA diam:      2.80 cm 1.30 cm/m   RA Area:     13.80 cm LA Vol (A2C): 15.1 ml 7.02 ml/m   RA Volume:   33.80 ml  15.71 ml/m LA Vol (A4C): 37.4 ml 17.39 ml/m  AORTIC VALVE AV Area (Vmax):    2.03 cm AV Area (Vmean):   2.00 cm AV Area (VTI):     2.31 cm AV Vmax:           177.14 cm/s AV Vmean:          120.945 cm/s AV VTI:            0.265 m AV Peak Grad:      12.6 mmHg AV Mean Grad:      6.9 mmHg LVOT Vmax:         127.00 cm/s LVOT Vmean:        85.300 cm/s LVOT VTI:          0.216 m LVOT/AV VTI ratio: 0.81  AORTA Ao Root diam: 2.90 cm Ao Asc diam:  3.20 cm MITRAL VALVE               TRICUSPID VALVE MV Area (PHT): 4.29 cm    TR Peak grad:   32.9 mmHg MV Decel Time: 177 msec    TR Vmax:        287.00 cm/s MR Peak grad: 4.9 mmHg MR Vmax:      111.00 cm/s  SHUNTS MV E velocity: 73.30 cm/s  Systemic VTI:  0.22 m MV A velocity: 84.20 cm/s  Systemic Diam: 1.90 cm MV E/A ratio:  0.87 Dina Rich MD Electronically signed by Dina Rich MD Signature Date/Time: 07/08/2023/9:47:10 AM    Final    US Venous Img Lower Bilateral (DVT)  Result Date: 07/07/2023 CLINICAL DATA:  Bilateral lower extremity edema EXAM: BILATERAL LOWER EXTREMITY VENOUS DOPPLER ULTRASOUND TECHNIQUE: Gray-scale sonography with graded compression, as well as color Doppler and duplex ultrasound were performed to evaluate the lower extremity deep venous systems from the level of the common femoral vein and including the common femoral, femoral, profunda femoral, popliteal and calf veins including the posterior tibial, peroneal and gastrocnemius veins when visible. The superficial great saphenous vein was also interrogated. Spectral Doppler was utilized to evaluate flow at rest and  with distal augmentation maneuvers in the common femoral, femoral and popliteal veins. COMPARISON:  None Available. FINDINGS: RIGHT LOWER EXTREMITY Common Femoral Vein: No evidence of  thrombus. Normal compressibility, respiratory phasicity and response to augmentation. Saphenofemoral Junction: No evidence of thrombus. Normal compressibility and flow on color Doppler imaging. Profunda Femoral Vein: No evidence of thrombus. Normal compressibility and flow on color Doppler imaging. Femoral Vein: No evidence of thrombus. Normal compressibility, respiratory phasicity and response to augmentation. Popliteal Vein: No evidence of thrombus. Normal compressibility, respiratory phasicity and response to augmentation. Calf Veins: No evidence of thrombus. Normal compressibility and flow on color Doppler imaging. Superficial Great Saphenous Vein: No evidence of thrombus. Normal compressibility. Venous Reflux:  None. Other Findings:  None. LEFT LOWER EXTREMITY Common Femoral Vein: No evidence of thrombus. Normal compressibility, respiratory phasicity and response to augmentation. Saphenofemoral Junction: No evidence of thrombus. Normal compressibility and flow on color Doppler imaging. Profunda Femoral Vein: No evidence of thrombus. Normal compressibility and flow on color Doppler imaging. Femoral Vein: No evidence of thrombus. Normal compressibility, respiratory phasicity and response to augmentation. Popliteal Vein: No evidence of thrombus. Normal compressibility, respiratory phasicity and response to augmentation. Calf Veins: No evidence of thrombus. Normal compressibility and flow on color Doppler imaging. Superficial Great Saphenous Vein: No evidence of thrombus. Normal compressibility. Venous Reflux:  None. Other Findings:  None. IMPRESSION: No evidence of deep venous thrombosis in either lower extremity. Electronically Signed   By: Malachy Moan M.D.   On: 07/07/2023 13:28   CT Angio Chest PE W and/or Wo  Contrast  Result Date: 07/06/2023 CLINICAL DATA:  Sepsis; Pulmonary embolism (PE) suspected, high prob. Shortness of breath. Edema. History of lung carcinoma. Urinary incontinence. Burning with urination. * Tracking Code: BO * EXAM: CT ANGIOGRAPHY CHEST CT ABDOMEN AND PELVIS WITH CONTRAST TECHNIQUE: Multidetector CT imaging of the chest was performed using the standard protocol during bolus administration of intravenous contrast. Multiplanar CT image reconstructions and MIPs were obtained to evaluate the vascular anatomy. Multidetector CT imaging of the abdomen and pelvis was performed using the standard protocol during bolus administration of intravenous contrast. RADIATION DOSE REDUCTION: This exam was performed according to the departmental dose-optimization program which includes automated exposure control, adjustment of the mA and/or kV according to patient size and/or use of iterative reconstruction technique. CONTRAST:  OMNIPAQUE IOHEXOL 350 MG/ML SOLN COMPARISON:  CT scan abdomen and pelvis from 06/11/2023 and CT angiography chest from 05/20/2023. FINDINGS: CTA CHEST FINDINGS Cardiovascular: No evidence of embolism to the proximal subsegmental pulmonary artery level. Please note, left lung lower lobe pulmonary artery is compressed by patient's pre-existing left lung lower lobe mass however, there is no embolism within. Normal cardiac size. No pericardial effusion. No aortic aneurysm. Mediastinum/Nodes: Visualized thyroid gland appears grossly unremarkable. No solid / cystic mediastinal masses. The esophagus is nondistended precluding optimal assessment. No axillary, mediastinal or hilar lymphadenopathy by size criteria. Lungs/Pleura: The trachea and right bronchial tree is patent. There is abrupt cutoff of left lower lobe bronchial tree. There is near complete collapse of the left lung lower lobe except few small areas of aeration of the superior segment, posteriorly. There is heterogeneous left mass  however, accurate measurement is difficult on this exam however, there is significant interval increase in the size when compared to the prior exam from 05/20/2023. There is resultant postobstructive pneumonia versus atelectasis of the left lung lower lobe. There is small right pleural effusion, new since the prior study. There are associated compressive atelectatic changes in the right lung lower lobe. No right lung mass or consolidation. No suspicious lung nodules. Musculoskeletal: A CT Port-a-Cath is seen in the left upper chest wall  with the catheter terminating in the cavo-atrial junction region. Visualized soft tissues of the chest wall are otherwise grossly unremarkable. No suspicious osseous lesions. There are mild multilevel degenerative changes in the visualized spine. Review of the MIP images confirms the above findings. CT ABDOMEN and PELVIS FINDINGS Hepatobiliary: The liver is normal in size. Non-cirrhotic configuration. No suspicious mass. No intrahepatic or extrahepatic bile duct dilation. No calcified gallstones. Normal gallbladder wall thickness. No pericholecystic inflammatory changes. Pancreas: Unremarkable. No pancreatic ductal dilatation or surrounding inflammatory changes. Spleen: Within normal limits. No focal lesion. Adrenals/Urinary Tract: Redemonstration of markedly enlarged right adrenal gland measuring 8.1 x 10.3 cm, similar to the prior study. There is hyperattenuating peripheral wall with central hypoattenuating areas and intervening hyperattenuating septations. No significant interval change since the prior study. Unremarkable left adrenal gland. No suspicious renal mass. There is a stable partially exophytic 5.2 x 7.9 cm cyst arising from the right kidney upper pole, posteriorly. No hydronephrosis. No renal or ureteric calculi. Unremarkable urinary bladder. Stomach/Bowel: No disproportionate dilation of the small or large bowel loops. No evidence of abnormal bowel wall thickening or  inflammatory changes. The appendix was not confidently visualized; however there is no acute inflammatory process in the right lower quadrant. Vascular/Lymphatic: No ascites or pneumoperitoneum. No abdominal or pelvic lymphadenopathy, by size criteria. No aneurysmal dilation of the major abdominal arteries. There are mild peripheral atherosclerotic vascular calcifications of the aorta and its major branches. Reproductive: Normal-size anteverted uterus. Redemonstration of thickened endometrium measuring up to 9 mm, without discrete focal mass. No large adnexal mass. Bilateral fallopian tube closure devices noted. Other: There is a tiny fat containing umbilical hernia. The soft tissues and abdominal wall are otherwise unremarkable. Musculoskeletal: No suspicious osseous lesions. There are mild multilevel degenerative changes in the visualized spine. Review of the MIP images confirms the above findings. IMPRESSION: 1. No evidence of pulmonary embolism to the proximal subsegmental pulmonary artery level. 2. Interval increase in size of left lung lower lobe mass with resultant postobstructive pneumonia versus atelectasis of the left lung lower lobe. Increasing right pleural effusion. 3. Stable right adrenal mass. 4. Stable thickened endometrium.  No focal mass. 5. Multiple other nonacute observations, as described above. Electronically Signed   By: Jules Schick M.D.   On: 07/06/2023 17:15   CT ABDOMEN PELVIS W CONTRAST  Result Date: 07/06/2023 CLINICAL DATA:  Sepsis; Pulmonary embolism (PE) suspected, high prob. Shortness of breath. Edema. History of lung carcinoma. Urinary incontinence. Burning with urination. * Tracking Code: BO * EXAM: CT ANGIOGRAPHY CHEST CT ABDOMEN AND PELVIS WITH CONTRAST TECHNIQUE: Multidetector CT imaging of the chest was performed using the standard protocol during bolus administration of intravenous contrast. Multiplanar CT image reconstructions and MIPs were obtained to evaluate the  vascular anatomy. Multidetector CT imaging of the abdomen and pelvis was performed using the standard protocol during bolus administration of intravenous contrast. RADIATION DOSE REDUCTION: This exam was performed according to the departmental dose-optimization program which includes automated exposure control, adjustment of the mA and/or kV according to patient size and/or use of iterative reconstruction technique. CONTRAST:  OMNIPAQUE IOHEXOL 350 MG/ML SOLN COMPARISON:  CT scan abdomen and pelvis from 06/11/2023 and CT angiography chest from 05/20/2023. FINDINGS: CTA CHEST FINDINGS Cardiovascular: No evidence of embolism to the proximal subsegmental pulmonary artery level. Please note, left lung lower lobe pulmonary artery is compressed by patient's pre-existing left lung lower lobe mass however, there is no embolism within. Normal cardiac size. No pericardial effusion. No aortic aneurysm.  Mediastinum/Nodes: Visualized thyroid gland appears grossly unremarkable. No solid / cystic mediastinal masses. The esophagus is nondistended precluding optimal assessment. No axillary, mediastinal or hilar lymphadenopathy by size criteria. Lungs/Pleura: The trachea and right bronchial tree is patent. There is abrupt cutoff of left lower lobe bronchial tree. There is near complete collapse of the left lung lower lobe except few small areas of aeration of the superior segment, posteriorly. There is heterogeneous left mass however, accurate measurement is difficult on this exam however, there is significant interval increase in the size when compared to the prior exam from 05/20/2023. There is resultant postobstructive pneumonia versus atelectasis of the left lung lower lobe. There is small right pleural effusion, new since the prior study. There are associated compressive atelectatic changes in the right lung lower lobe. No right lung mass or consolidation. No suspicious lung nodules. Musculoskeletal: A CT Port-a-Cath is  seen in the left upper chest wall with the catheter terminating in the cavo-atrial junction region. Visualized soft tissues of the chest wall are otherwise grossly unremarkable. No suspicious osseous lesions. There are mild multilevel degenerative changes in the visualized spine. Review of the MIP images confirms the above findings. CT ABDOMEN and PELVIS FINDINGS Hepatobiliary: The liver is normal in size. Non-cirrhotic configuration. No suspicious mass. No intrahepatic or extrahepatic bile duct dilation. No calcified gallstones. Normal gallbladder wall thickness. No pericholecystic inflammatory changes. Pancreas: Unremarkable. No pancreatic ductal dilatation or surrounding inflammatory changes. Spleen: Within normal limits. No focal lesion. Adrenals/Urinary Tract: Redemonstration of markedly enlarged right adrenal gland measuring 8.1 x 10.3 cm, similar to the prior study. There is hyperattenuating peripheral wall with central hypoattenuating areas and intervening hyperattenuating septations. No significant interval change since the prior study. Unremarkable left adrenal gland. No suspicious renal mass. There is a stable partially exophytic 5.2 x 7.9 cm cyst arising from the right kidney upper pole, posteriorly. No hydronephrosis. No renal or ureteric calculi. Unremarkable urinary bladder. Stomach/Bowel: No disproportionate dilation of the small or large bowel loops. No evidence of abnormal bowel wall thickening or inflammatory changes. The appendix was not confidently visualized; however there is no acute inflammatory process in the right lower quadrant. Vascular/Lymphatic: No ascites or pneumoperitoneum. No abdominal or pelvic lymphadenopathy, by size criteria. No aneurysmal dilation of the major abdominal arteries. There are mild peripheral atherosclerotic vascular calcifications of the aorta and its major branches. Reproductive: Normal-size anteverted uterus. Redemonstration of thickened endometrium measuring up  to 9 mm, without discrete focal mass. No large adnexal mass. Bilateral fallopian tube closure devices noted. Other: There is a tiny fat containing umbilical hernia. The soft tissues and abdominal wall are otherwise unremarkable. Musculoskeletal: No suspicious osseous lesions. There are mild multilevel degenerative changes in the visualized spine. Review of the MIP images confirms the above findings. IMPRESSION: 1. No evidence of pulmonary embolism to the proximal subsegmental pulmonary artery level. 2. Interval increase in size of left lung lower lobe mass with resultant postobstructive pneumonia versus atelectasis of the left lung lower lobe. Increasing right pleural effusion. 3. Stable right adrenal mass. 4. Stable thickened endometrium.  No focal mass. 5. Multiple other nonacute observations, as described above. Electronically Signed   By: Jules Schick M.D.   On: 07/06/2023 17:15   DG Chest Portable 1 View  Result Date: 07/06/2023 CLINICAL DATA:  Cough and swelling EXAM: PORTABLE CHEST 1 VIEW COMPARISON:  X-ray 06/24/2023 FINDINGS: Left upper chest port. Tip along the central SVC. Stable cardiopericardial silhouette. No pneumothorax or effusion. Developing left retrocardiac opacity. New infiltrates  possible. Stable left perihilar mass and possible additional nodular area in the medial right lung base as on prior. Please correlate with prior workup. Overlapping cardiac leads. Fixation hardware along the lower cervical spine at the edge of the imaging field. IMPRESSION: Developing left retrocardiac opacity.  Possible new infiltrate. Stable left perihilar lung mass with chest port Electronically Signed   By: Karen Kays M.D.   On: 07/06/2023 16:38   DG Chest Port 1 View  Result Date: 06/24/2023 CLINICAL DATA:  Port-A-Cath placement EXAM: PORTABLE CHEST 1 VIEW COMPARISON:  06/14/2023 FINDINGS: Left Port-A-Cath tip at low SVC 2 superior caval/atrial junction. Right hemidiaphragm elevation.  Cervical spine  fixation. Patient rotated left. Midline trachea. Normal heart size. No pleural effusion or pneumothorax. Left perihilar mass measures 6.1 cm today versus 5.5 cm on 06/14/2023. IMPRESSION: Left Port-A-Cath terminates at the low SVC or superior caval/atrial junction, without pneumothorax. Left perihilar mass measures slightly larger today than on 06/04/2023. Difference is favored to be due to mild obliquity. Electronically Signed   By: Jeronimo Greaves M.D.   On: 06/24/2023 19:10   DG C-Arm 1-60 Min-No Report  Result Date: 06/24/2023 Fluoroscopy was utilized by the requesting physician.  No radiographic interpretation.   DG Abd 1 View  Result Date: 06/21/2023 CLINICAL DATA:  Small bowel obstruction. EXAM: ABDOMEN - 1 VIEW COMPARISON:  June 20, 2023. FINDINGS: No abnormal bowel dilatation is noted. Surgical clips are noted in the pelvis. IMPRESSION: No abnormal bowel dilatation. Electronically Signed   By: Lupita Raider M.D.   On: 06/21/2023 09:53   CT ABDOMEN PELVIS W CONTRAST  Result Date: 06/20/2023 CLINICAL DATA:  Abdominal pain for several weeks. History of lung cancer. * Tracking Code: BO * EXAM: CT ABDOMEN AND PELVIS WITH CONTRAST TECHNIQUE: Multidetector CT imaging of the abdomen and pelvis was performed using the standard protocol following bolus administration of intravenous contrast. RADIATION DOSE REDUCTION: This exam was performed according to the departmental dose-optimization program which includes automated exposure control, adjustment of the mA and/or kV according to patient size and/or use of iterative reconstruction technique. CONTRAST:  OMNIPAQUE IOHEXOL 300 MG/ML  SOLN COMPARISON:  06/11/2023 FINDINGS: Lower chest: Rapidly enlarging mass within the left hilar region is identified. This now measures at least 5.9 x 5.0 cm, image 9/4. On the PET-CT from 05/27/23 this measured 5.7 x 3.5 cm. New small right pleural effusion. Hepatobiliary: There is no suspicious liver abnormality. The  gallbladder appears normal. No bile duct dilatation. Pancreas: Unremarkable. Spleen: No focal splenic abnormality. Spleen measures 12.6 cm cranial caudal Adrenals/Urinary Tract: Large solid and cystic mass centered around the right adrenal gland measures 10.3 by 7.9 by 13.7, image 22/2. On 06/11/2023 10.0 by 7.4 by 13.1 cm. Normal left adrenal gland. The kidneys appear unremarkable. No signs of nephrolithiasis, hydronephrosis or mass. Urinary bladder appears normal. Stomach/Bowel: Stomach appears within normal limits. Progressive distension of the small bowel measure up to 2.9 cm with a few air-fluid levels. The distal small bowel loops have a normal caliber up to the ileocecal valve. No bowel wall thickening or inflammation. Normal caliber of the colon. Vascular/Lymphatic: Aortic atherosclerosis. Aortocaval lymph node measures 1 cm, image 33/2. No pelvic or inguinal adenopathy. Reproductive: Small fibroid arises off the subserosal right uterine corpus measuring 2.1 cm, image 79/2. The endometrial stripe measures 9 mm in thickness which is considered abnormal in a postmenopausal female. No adnexal mass. Other: No significant free fluid or fluid collections. Musculoskeletal: No acute or significant osseous findings.  IMPRESSION: 1. Progressive distension of the small bowel measure up to 2.9 cm with a few air-fluid levels. The distal small bowel loops have a normal caliber up to the ileocecal valve. Although nonspecific these findings may reflect developing small bowel ileus or early small-bowel obstruction. Consider serial follow-up radiographs of the abdomen. 2. Enlarging mass within the left hilar region is identified. The visualized portions in the left lower lobe measures 5.9 x 5.0 cm. On the PET-CT from 05/27/23 the perihilar portion measured up to 5.7 x 3.5 cm. 3. 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 On 06/11/2023 10.0 by 7.4 by 13.1 cm. 4. New small right  pleural effusion. 5. Endometrial stripe measures 9 mm in thickness which is considered abnormal in a postmenopausal female. Consider further evaluation with pelvic ultrasound. 6.  Aortic Atherosclerosis (ICD10-I70.0). Electronically Signed   By: Signa Kell M.D.   On: 06/20/2023 17:50   MR Brain W Wo Contrast  Result Date: 06/16/2023 CLINICAL DATA:  Provided history: Malignant neoplasm of unspecified part of unspecified bronchus or lung. Non-small cell lung cancer, staging. EXAM: MRI HEAD WITHOUT AND WITH CONTRAST TECHNIQUE: Multiplanar, multiecho pulse sequences of the brain and surrounding structures were obtained without and with intravenous contrast. CONTRAST:  10mL GADAVIST GADOBUTROL 1 MMOL/ML IV SOLN COMPARISON:  Head CT 04/09/2016. FINDINGS: Brain: Cerebral volume is normal. 2 mm focus of enhancement along the right central sulcus which is indeterminate for a small cortical metastasis versus vascular enhancement (series 16, image 34). Small T2 hyperintense foci within the left thalamus and left lentiform nucleus, which may reflect prominent perivascular spaces or chronic lacunar infarcts. There are a few small foci of T2 FLAIR hyperintense signal abnormality within the cerebral white matter, nonspecific but compatible with minimal changes of chronic small vessel ischemia. There is no acute infarct. No chronic intracranial blood products. No extra-axial fluid collection. No midline shift. Vascular: Maintained flow voids within the proximal large arterial vessels. Small developmental venous anomaly within the anterior right frontal lobe (anatomic variant). Skull and upper cervical spine: No focal suspicious marrow lesion. Susceptibility artifact arising from ACDF hardware. Sinuses/Orbits: No mass or acute finding within the imaged orbits. No significant paranasal sinus disease. Impression #1 will be called to the ordering clinician or representative by the Radiologist Assistant, and communication  documented in the PACS or [employer]. IMPRESSION: 1. 2 mm focus of enhancement along the right central sulcus, which is indeterminate for a small cortical metastasis versus vascular enhancement. A short-interval follow-up brain MRI (with and without contrast) is recommended in 6-8 weeks. 2. Small T2 hyperintense foci within the deep gray nuclei on the left, which may reflect prominent perivascular spaces or chronic lacunar infarcts. 3. Minimal chronic small vessel ischemic changes within the cerebral white matter. Electronically Signed   By: Jackey Loge D.O.   On: 06/16/2023 17:40   DG Chest Port 1 View  Result Date: 06/14/2023 CLINICAL DATA:  Status post bronchoscopy and biopsy EXAM: PORTABLE CHEST 1 VIEW COMPARISON:  05/07/2023 FINDINGS: Normal heart size. Left hilar mass. No acute abnormality of the lungs following bronchoscopy. The visualized skeletal structures are unremarkable. IMPRESSION: No acute abnormality of the lungs status post bronchoscopy. Left hilar mass. Electronically Signed   By: Jearld Lesch M.D.   On: 06/14/2023 13:11   DG C-ARM BRONCHOSCOPY  Result Date: 06/14/2023 C-ARM BRONCHOSCOPY: Fluoroscopy was utilized by the requesting physician.  No radiographic interpretation.   CT ABDOMEN PELVIS W CONTRAST  Result Date: 06/11/2023 CLINICAL DATA:  62 year old female with history of abdominal pain. Nausea and vomiting for the past 6 weeks. Additional history of lung cancer. * Tracking Code: BO * EXAM: CT ABDOMEN AND PELVIS WITH CONTRAST TECHNIQUE: Multidetector CT imaging of the abdomen and pelvis was performed using the standard protocol following bolus administration of intravenous contrast. RADIATION DOSE REDUCTION: This exam was performed according to the departmental dose-optimization program which includes automated exposure control, adjustment of the mA and/or kV according to patient size and/or use of iterative reconstruction technique. CONTRAST:  75mL OMNIPAQUE IOHEXOL 350  MG/ML SOLN COMPARISON:  PET-CT 05/27/2023. CT of the abdomen and pelvis 05/20/2023. FINDINGS: Lower chest: Trace right pleural effusion lying dependently. Hepatobiliary: No definite suspicious cystic or solid hepatic lesions. No intra or extrahepatic biliary ductal dilatation. Gallbladder is unremarkable in appearance. Pancreas: No pancreatic mass. No pancreatic ductal dilatation. No pancreatic or peripancreatic fluid collections or inflammatory changes. Spleen: Unremarkable. Adrenals/Urinary Tract: Large heterogeneous appearing mixed cystic and solid right adrenal mass (axial image 25 of series 3 and coronal image 64 of series 6) which currently measures 10.6 x 7.2 x 14.6 cm, which exerts mass effect upon adjacent structures displacing the right lobe of the liver anteriorly and superiorly, and making broad contact with the upper pole of the right kidney which is mildly distorted by the adrenal mass. Left kidney and left adrenal gland are normal in appearance. No hydroureteronephrosis. Urinary bladder is unremarkable in appearance. Stomach/Bowel: The appearance of the stomach is normal. There is no pathologic dilatation of small bowel or colon. Numerous colonic diverticuli are noted, without surrounding inflammatory changes to suggest an acute diverticulitis at this time. The appendix is not confidently identified and may be surgically absent. Regardless, there are no inflammatory changes noted adjacent to the cecum to suggest the presence of an acute appendicitis at this time. Vascular/Lymphatic: Atherosclerosis in the abdominal aorta and pelvic vasculature. No lymphadenopathy noted in the abdomen or pelvis. Reproductive: Tubal ligation clips are noted bilaterally. Uterus and ovaries are otherwise unremarkable in appearance. Other: No significant volume of ascites.  No pneumoperitoneum. Musculoskeletal: There are no aggressive appearing lytic or blastic lesions noted in the visualized portions of the skeleton.  IMPRESSION: 1. No definite acute findings are noted in the abdomen or pelvis. 2. Large right adrenal metastasis redemonstrated, slightly increased in size compared to prior studies, most compatible with a metastatic lesion. 3. Trace right pleural effusion lying dependently. 4. Aortic atherosclerosis. 5. Additional incidental findings, as above. Electronically Signed   By: Trudie Reed M.D.   On: 06/11/2023 05:20    Catarina Hartshorn, DO  [facility]  If 7PM-7AM, please contact night-coverage [email or web address] Password [email or web address] 07/08/2023, 5:25 PM   LOS: 2 days

## 2023-07-08 NOTE — Consult Note (Signed)
[facility] Consultation Oncology  Name: Miranda Perkins      MRN: [id number]    Location: A312/A312-01  Date: 07/08/2023 Time:4:34 PM   REFERRING PHYSICIAN: Dr. Arbutus Leas  REASON FOR CONSULT: Metastatic adenocarcinoma the lung   DIAGNOSIS: Postobstructive pneumonia  HISTORY OF PRESENT ILLNESS: Miranda Perkins is well-known to me from office visits.  She is admitted on 07/08/2023 with fever of 102.2, tachycardia, tachypnea and postobstructive pneumonia on CT angiogram of the chest which was negative for PE.  She was started on vancomycin, aztreonam and metronidazole.  She is feeling somewhat better today.  She also had Dopplers of the lower extremities done which were negative.  2D echocardiogram showing normal ejection fraction.  PAST MEDICAL HISTORY:   Past Medical History:  Diagnosis Date   Adrenal hemorrhage (HCC)    Anemia    Arthritis    Cancer (HCC)    skin cancer - basal   Dyspnea    Edema of both lower extremities    Endometrial polyp    Hirsutism    History of 2019 novel coronavirus disease (COVID-19) 12/21/2020   positive home result documented in pcp note in epic 12-23-2020,  residual doe, pulmonology consult w/ dr wert note in epic 03-20-2021   History of basal cell carcinoma (BCC) excision 2016   forehead, per pt no recurrence   History of CVA (cerebrovascular accident) without residual deficits 1993   pt stated , while on Chloramphenicol, no residuaL; [medication given to treat tick bite] ; patient states  "i stroked out right next to my doctor , he said i passed out and that was my only symptom" ;  no resiudal   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 branch,  work-up results in epic 03/ 2022 (normal nuclear stress test, normal echo, no arrhythmia's per event monitor);   (05-28-2021 per pt no symptoms since 03/ 2022)    Multiple thyroid nodules    endocrinologist--- dr g. nida,  hx benign bx, clinically euthyroid   Osteopenia 12/2017   T score -1.2 FRAX 4.9% / 0.3%   PMB (postmenopausal bleeding)    Pneumonia    x 2   Post-COVID chronic dyspnea    Pre-diabetes    Rosacea    Stroke (HCC)    Thickened endometrium     ALLERGIES: Allergies  Allergen Reactions   Influenza Vaccines Anaphylaxis and Other (See Comments)    Per patient   Penicillins Anaphylaxis and Other (See Comments)   Articaine Other (See Comments)    Caused infection   Cortisone Other (See Comments)    Turned red and ran a low grade fever for 3 days   Other Rash    bandaids- skin turns red and a rash       MEDICATIONS: I have reviewed the patient's current medications.     PAST SURGICAL HISTORY Past Surgical History:  Procedure Laterality Date   ANTERIOR AND POSTERIOR REPAIR N/A 08/21/2020   Procedure: ANTERIOR (CYSTOCELE) AND POSTERIOR REPAIR (RECTOCELE);  Surgeon: Genia Del, MD;  Location: [facility];  Service: Gynecology;  Laterality: N/A;  requesting 9:00am OR time  requests one hour   ANTERIOR CERVICAL DECOMP/DISCECTOMY FUSION N/A 09/27/2017   Procedure: ANTERIOR CERVICAL DECOMPRESSION/DISCECTOMY FUSION CERVICAL FOUR-FIVE ,CERVICAL FIVE-SIX,CERVICAL SIX-SEVEN;  Surgeon: Donalee Citrin, MD;  Location: [facility] OR;  Service: Neurosurgery;  Laterality: N/A;   AXILLARY LYMPH NODE BIOPSY Right 06/24/2023   Procedure: AXILLARY LYMPH NODE BIOPSY;  Surgeon: Franky Macho, MD;  Location: [facility]S;  Service: General;  Laterality: Right;   BRONCHIAL BIOPSY  06/14/2023   Procedure: BRONCHIAL BIOPSIES;  Surgeon: Leslye Peer, MD;  Location: [facility] ENDOSCOPY;  Service: Pulmonary;;   BRONCHIAL BRUSHINGS  06/14/2023   Procedure: BRONCHIAL BRUSHINGS;  Surgeon: Leslye Peer, MD;  Location: [facility] ENDOSCOPY;  Service: Pulmonary;;   BRONCHIAL NEEDLE ASPIRATION BIOPSY  06/14/2023   Procedure: BRONCHIAL NEEDLE ASPIRATION BIOPSIES;   Surgeon: Leslye Peer, MD;  Location: [facility] ENDOSCOPY;  Service: Pulmonary;;   CARPAL TUNNEL RELEASE Left 09/27/2017   Procedure: CARPAL TUNNEL RELEASE;  Surgeon: Donalee Citrin, MD;  Location: [facility] OR;  Service: Neurosurgery;  Laterality: Left;   DILATATION & CURETTAGE/HYSTEROSCOPY WITH MYOSURE N/A 06/03/2021   Procedure: DILATATION & CURETTAGE/HYSTEROSCOPY WITH MYOSURE;  Surgeon: Genia Del, MD;  Location: [facility];  Service: Gynecology;  Laterality: N/A;   FOOT SURGERY     bone removed from pinky toe   HEMOSTASIS CONTROL  06/14/2023   Procedure: HEMOSTASIS CONTROL;  Surgeon: Leslye Peer, MD;  Location: [facility] ENDOSCOPY;  Service: Pulmonary;;   INGUINAL HERNIA REPAIR Right 1995   KNEE ARTHROSCOPY W/ MENISCAL REPAIR Left 06/21/2017   dr Netta Corrigan   LEG SURGERY  1972   MOHS SURGERY  2016   [facility] of forehead   MOUTH SURGERY  2015   removal and cauterization pallete of cancerous lesion   PORTACATH PLACEMENT N/A 06/24/2023   Procedure: INSERTION PORT-A-CATH;  Surgeon: Franky Macho, MD;  Location: [facility];  Service: General;  Laterality: N/A;   TONSILLECTOMY  1979   TONSILLECTOMY  1977   TOTAL KNEE ARTHROPLASTY Left 01/26/2018   Procedure: LEFT TOTAL KNEE ARTHROPLASTY;  Surgeon: Ranee Gosselin, MD;  Location: [facility] ORS;  Service: Orthopedics;  Laterality: Left;   TUBAL LIGATION     VIDEO BRONCHOSCOPY WITH ENDOBRONCHIAL ULTRASOUND Bilateral 06/14/2023   Procedure: VIDEO BRONCHOSCOPY WITH ENDOBRONCHIAL ULTRASOUND;  Surgeon: Leslye Peer, MD;  Location: [facility] ENDOSCOPY;  Service: Pulmonary;  Laterality: Bilateral;    FAMILY HISTORY: Family History  Problem Relation Age of Onset   Diabetes Mother    Heart Problems Mother    Rheum arthritis Mother    COPD Mother    Polycystic kidney disease Mother    Carpal tunnel syndrome Mother    Cancer Father        Liver Cancer   Liver cancer Father    Carpal tunnel syndrome Sister    Thyroid disease Sister    Rheum arthritis Sister     Lung disease Sister    Diabetes Sister    Carpal tunnel syndrome Sister    Clotting disorder Sister    Rheum arthritis Sister    Thyroid disease Sister    COPD Sister    Heart Problems Sister    Gout Sister    Cancer Brother    Heart Problems Maternal Grandmother    Cancer Maternal Grandfather        Lung Cancer   Heart Problems Maternal Grandfather    Heart Problems Paternal Grandmother    Cancer Paternal Grandfather        Brain & Skin cancer   Polycystic ovary syndrome Daughter    Rheum arthritis Maternal Aunt    Fibromyalgia Maternal Aunt    Polycystic kidney disease Maternal Aunt    Heart Problems Maternal Aunt  Anemia Maternal Aunt    Adrenal disorder Maternal Aunt    Carpal tunnel syndrome Maternal Aunt    Diabetes Maternal Aunt    Thyroid disease Maternal Aunt    Rheum arthritis Maternal Aunt    Diabetes Maternal Aunt    Diabetes Maternal Aunt    Rheum arthritis Maternal Aunt    Heart Problems Maternal Aunt    Rheum arthritis Maternal Uncle    Heart Problems Maternal Uncle     SOCIAL HISTORY:  reports that she quit smoking about 13 years ago. Her smoking use included cigarettes. She started smoking about 48 years ago. She has a 17.5 pack-year smoking history. She has never used smokeless tobacco. She reports that she does not currently use alcohol. She reports that she does not use drugs.  PERFORMANCE STATUS: The patient's performance status is 2 - Symptomatic, <50% confined to bed  PHYSICAL EXAM: Most Recent Vital Signs: Blood pressure (!) 112/43, pulse (!) 110, temperature 98.8 F (37.1 C), temperature source Oral, resp. rate (!) 22, height 5\' 6"  (1.676 m), weight 237 lb 7 oz (107.7 kg), SpO2 100%. BP (!) 112/43 (BP Location: Left Arm)   Pulse (!) 110   Temp 98.8 F (37.1 C) (Oral)   Resp (!) 22   Ht 5\' 6"  (1.676 m)   Wt 237 lb 7 oz (107.7 kg)   SpO2 100%   BMI 38.32 kg/m  General appearance: alert, cooperative, and appears stated age Lungs:   Bilateral air entry. Heart: regular rate and rhythm Extremities:  3+ edema bilaterally.  LABORATORY DATA:  Results for orders placed or performed during the hospital encounter of 07/06/23 (from the past 48 hour(s))  Basic metabolic panel     Status: Abnormal   Collection Time: 07/07/23  4:36 AM  Result Value Ref Range   Sodium 133 (L) 135 - 145 mmol/L   Potassium 3.1 (L) 3.5 - 5.1 mmol/L   Chloride 98 98 - 111 mmol/L   CO2 25 22 - 32 mmol/L   Glucose, Bld 104 (H) 70 - 99 mg/dL    Comment: Glucose reference range applies only to samples taken after fasting for at least 8 hours.   BUN 11 8 - 23 mg/dL   Creatinine, Ser 1.61 0.44 - 1.00 mg/dL   Calcium 7.9 (L) 8.9 - 10.3 mg/dL   GFR, Estimated >09 >60 mL/min    Comment: (NOTE) Calculated using the CKD-EPI Creatinine Equation (2021)    Anion gap 10 5 - 15    Comment: Performed at [facility], [address], [place], [place] [address]  CBC     Status: Abnormal   Collection Time: 07/07/23  4:36 AM  Result Value Ref Range   WBC 9.9 4.0 - 10.5 K/uL   RBC 3.77 (L) 3.87 - 5.11 MIL/uL   Hemoglobin 9.6 (L) 12.0 - 15.0 g/dL   HCT 81.1 (L) 91.4 - 78.2 %   MCV 81.7 80.0 - 100.0 fL   MCH 25.5 (L) 26.0 - 34.0 pg   MCHC 31.2 30.0 - 36.0 g/dL   RDW 95.6 (H) 21.3 - 08.6 %   Platelets 416 (H) 150 - 400 K/uL   nRBC 0.0 0.0 - 0.2 %    Comment: Performed at [facility], [address], [place], [place] [address]  Procalcitonin     Status: None   Collection Time: 07/07/23  5:16 AM  Result Value Ref Range   Procalcitonin 4.81 ng/mL    Comment:  Interpretation: PCT > 2 ng/mL: Systemic infection (sepsis) is likely, unless other causes are known. (NOTE)       Sepsis PCT Algorithm           Lower Respiratory Tract                                      Infection PCT Algorithm    ----------------------------     ----------------------------         PCT < 0.25 ng/mL                PCT < 0.10 ng/mL          Strongly encourage              Strongly discourage   discontinuation of antibiotics    initiation of antibiotics    ----------------------------     -----------------------------       PCT 0.25 - 0.50 ng/mL            PCT 0.10 - 0.25 ng/mL               OR       >80% decrease in PCT            Discourage initiation of                                            antibiotics      Encourage discontinuation           of antibiotics    ----------------------------     -----------------------------         PCT >= 0.50 ng/mL              PCT 0.26 - 0.50 ng/mL               AND       <80% decrease in PCT              Encourage initiation of                                             antibiotics       Encourage continuation           of antibiotics    ----------------------------     -----------------------------        PCT >= 0.50 ng/mL                  PCT > 0.50 ng/mL               AND         increase in PCT                  Strongly encourage                                      initiation of antibiotics    Strongly encourage escalation           of antibiotics                                     -----------------------------  PCT <= 0.25 ng/mL                                                 OR                                        > 80% decrease in PCT                                      Discontinue / Do not initiate                                             antibiotics  Performed at [facility], [address], [place], [place] [address]   MRSA Next Gen by PCR, Nasal     Status: None   Collection Time: 07/07/23  8:58 AM   Specimen: Nasal Mucosa; Nasal Swab  Result Value Ref Range   MRSA by PCR Next Gen NOT DETECTED NOT DETECTED    Comment: (NOTE) The GeneXpert MRSA Assay (FDA approved for NASAL specimens only), is one component of a comprehensive MRSA colonization surveillance program. It is not intended to diagnose MRSA infection nor to guide or monitor  treatment for MRSA infections. Test performance is not FDA approved in patients less than 42 years old. Performed at [facility], [address] [place], [place] [address]   Comprehensive metabolic panel     Status: Abnormal   Collection Time: 07/08/23  4:30 AM  Result Value Ref Range   Sodium 135 135 - 145 mmol/L   Potassium 3.5 3.5 - 5.1 mmol/L   Chloride 101 98 - 111 mmol/L   CO2 24 22 - 32 mmol/L   Glucose, Bld 97 70 - 99 mg/dL    Comment: Glucose reference range applies only to samples taken after fasting for at least 8 hours.   BUN 10 8 - 23 mg/dL   Creatinine, Ser 9.62 0.44 - 1.00 mg/dL   Calcium 8.0 (L) 8.9 - 10.3 mg/dL   Total Protein 5.1 (L) 6.5 - 8.1 g/dL   Albumin 1.9 (L) 3.5 - 5.0 g/dL   AST 15 15 - 41 U/L   ALT 26 0 - 44 U/L   Alkaline Phosphatase 121 38 - 126 U/L   Total Bilirubin 0.6 0.3 - 1.2 mg/dL   GFR, Estimated >95 >28 mL/min    Comment: (NOTE) Calculated using the CKD-EPI Creatinine Equation (2021)    Anion gap 10 5 - 15    Comment: Performed at [facility], [address], [place], [place] [address]  Procalcitonin     Status: None   Collection Time: 07/08/23  4:30 AM  Result Value Ref Range   Procalcitonin 4.44 ng/mL    Comment:        Interpretation: PCT > 2 ng/mL: Systemic infection (sepsis) is likely, unless other causes are known. (NOTE)       Sepsis PCT Algorithm           Lower Respiratory Tract  Infection PCT Algorithm    ----------------------------     ----------------------------         PCT < 0.25 ng/mL                PCT < 0.10 ng/mL          Strongly encourage             Strongly discourage   discontinuation of antibiotics    initiation of antibiotics    ----------------------------     -----------------------------       PCT 0.25 - 0.50 ng/mL            PCT 0.10 - 0.25 ng/mL               OR       >80% decrease in PCT            Discourage initiation of                                             antibiotics      Encourage discontinuation           of antibiotics    ----------------------------     -----------------------------         PCT >= 0.50 ng/mL              PCT 0.26 - 0.50 ng/mL               AND       <80% decrease in PCT              Encourage initiation of                                             antibiotics       Encourage continuation           of antibiotics    ----------------------------     -----------------------------        PCT >= 0.50 ng/mL                  PCT > 0.50 ng/mL               AND         increase in PCT                  Strongly encourage                                      initiation of antibiotics    Strongly encourage escalation           of antibiotics                                     -----------------------------                                           PCT <= 0.25 ng/mL  OR                                        > 80% decrease in PCT                                      Discontinue / Do not initiate                                             antibiotics  Performed at [facility], [address] [place], [place] [address]   CBC     Status: Abnormal   Collection Time: 07/08/23  4:30 AM  Result Value Ref Range   WBC 9.3 4.0 - 10.5 K/uL   RBC 3.69 (L) 3.87 - 5.11 MIL/uL   Hemoglobin 9.3 (L) 12.0 - 15.0 g/dL   HCT 10.2 (L) 72.5 - 36.6 %   MCV 83.2 80.0 - 100.0 fL   MCH 25.2 (L) 26.0 - 34.0 pg   MCHC 30.3 30.0 - 36.0 g/dL   RDW 44.0 (H) 34.7 - 42.5 %   Platelets 411 (H) 150 - 400 K/uL   nRBC 0.0 0.0 - 0.2 %    Comment: Performed at [facility], [address], [place], [place] [address]      RADIOGRAPHY: ECHOCARDIOGRAM COMPLETE  Result Date: 07/08/2023    ECHOCARDIOGRAM REPORT   Patient Name:   BUFFI WITKIN Schlotter Date of Exam: 07/08/2023 Medical Rec #:  [id number]          Height:       66.0 in Accession #:    [id number]         Weight:       237.4 lb Date of Birth:  08/12/1961           BSA:          2.151 m Patient Age:    62 years           BP:           11/71 mmHg Patient Gender: F                  HR:           103 bpm. [facility] Procedure: 2D Echo, Cardiac Doppler and Color Doppler Indications:    Swelling of lower extremity [[id number]]  History:        Patient has prior history of Echocardiogram examinations, most                 recent 01/24/2021. Lung Cancer and Stroke; Signs/Symptoms:Edema.                 Sepsis.  Sonographer:    Aron Baba Referring Phys: [id number] Onnie Boer  Sonographer Comments: Patient is obese. Image acquisition challenging due to respiratory motion. IMPRESSIONS  1. Left ventricular ejection fraction, by estimation, is >75%. The left ventricle has hyperdynamic function. The left ventricle has no regional wall motion abnormalities. Left ventricular diastolic parameters are indeterminate.  2. Right ventricular systolic function is normal. The right ventricular size is mildly enlarged. There is mildly elevated pulmonary artery systolic pressure.  3. The mitral valve is normal  in structure. No evidence of mitral valve regurgitation. No evidence of mitral stenosis.  4. The tricuspid valve is abnormal.  5. The aortic valve is tricuspid. Aortic valve regurgitation is not visualized. No aortic stenosis is present.  6. IVC is small suggesting low RA pressure and hypovolemia. FINDINGS  Left Ventricle: Hyperdynamic LV function creates a mild dynamic LVOT gradient, peak gradient 15 mmHg. Left ventricular ejection fraction, by estimation, is >75%. The left ventricle has hyperdynamic function. The left ventricle has no regional wall motion abnormalities. The left ventricular internal cavity size was normal in size. There is no left ventricular hypertrophy. Left ventricular diastolic parameters are indeterminate. Right Ventricle: The right ventricular size is mildly enlarged. Right vetricular wall thickness was not well visualized. Right ventricular  systolic function is normal. There is mildly elevated pulmonary artery systolic pressure. The tricuspid regurgitant  velocity is 2.87 m/s, and with an assumed right atrial pressure of 8 mmHg, the estimated right ventricular systolic pressure is 40.9 mmHg. Left Atrium: Left atrial size was normal in size. Right Atrium: Right atrial size was normal in size. Pericardium: There is no evidence of pericardial effusion. Mitral Valve: The mitral valve is normal in structure. No evidence of mitral valve regurgitation. No evidence of mitral valve stenosis. Tricuspid Valve: The tricuspid valve is abnormal. Tricuspid valve regurgitation is mild . No evidence of tricuspid stenosis. Aortic Valve: The aortic valve is tricuspid. Aortic valve regurgitation is not visualized. No aortic stenosis is present. Aortic valve mean gradient measures 6.9 mmHg. Aortic valve peak gradient measures 12.6 mmHg. Aortic valve area, by VTI measures 2.31  cm. Pulmonic Valve: The pulmonic valve was not well visualized. Pulmonic valve regurgitation is not visualized. No evidence of pulmonic stenosis. Aorta: The aortic root is normal in size and structure. Venous: IVC is small suggesting low RA pressure and hypovolemia. IAS/Shunts: No atrial level shunt detected by color flow Doppler.  LEFT VENTRICLE PLAX 2D LVIDd:         4.20 cm   Diastology LVIDs:         2.40 cm   LV e' medial:    9.95 cm/s LV PW:         0.90 cm   LV E/e' medial:  7.4 LV IVS:        0.90 cm   LV e' lateral:   13.50 cm/s LVOT diam:     1.90 cm   LV E/e' lateral: 5.4 LV SV:         61 LV SV Index:   28 LVOT Area:     2.84 cm  RIGHT VENTRICLE RV S prime:     26.70 cm/s TAPSE (M-mode): 2.4 cm LEFT ATRIUM           Index        RIGHT ATRIUM           Index LA diam:      2.80 cm 1.30 cm/m   RA Area:     13.80 cm LA Vol (A2C): 15.1 ml 7.02 ml/m   RA Volume:   33.80 ml  15.71 ml/m LA Vol (A4C): 37.4 ml 17.39 ml/m  AORTIC VALVE AV Area (Vmax):    2.03 cm AV Area (Vmean):   2.00 cm  AV Area (VTI):     2.31 cm AV Vmax:           177.14 cm/s AV Vmean:          120.945 cm/s AV VTI:  0.265 m AV Peak Grad:      12.6 mmHg AV Mean Grad:      6.9 mmHg LVOT Vmax:         127.00 cm/s LVOT Vmean:        85.300 cm/s LVOT VTI:          0.216 m LVOT/AV VTI ratio: 0.81  AORTA Ao Root diam: 2.90 cm Ao Asc diam:  3.20 cm MITRAL VALVE               TRICUSPID VALVE MV Area (PHT): 4.29 cm    TR Peak grad:   32.9 mmHg MV Decel Time: 177 msec    TR Vmax:        287.00 cm/s MR Peak grad: 4.9 mmHg MR Vmax:      111.00 cm/s  SHUNTS MV E velocity: 73.30 cm/s  Systemic VTI:  0.22 m MV A velocity: 84.20 cm/s  Systemic Diam: 1.90 cm MV E/A ratio:  0.87 Dina Rich MD Electronically signed by Dina Rich MD Signature Date/Time: 07/08/2023/9:47:10 AM    Final    US Venous Img Lower Bilateral (DVT)  Result Date: 07/07/2023 CLINICAL DATA:  Bilateral lower extremity edema EXAM: BILATERAL LOWER EXTREMITY VENOUS DOPPLER ULTRASOUND TECHNIQUE: Gray-scale sonography with graded compression, as well as color Doppler and duplex ultrasound were performed to evaluate the lower extremity deep venous systems from the level of the common femoral vein and including the common femoral, femoral, profunda femoral, popliteal and calf veins including the posterior tibial, peroneal and gastrocnemius veins when visible. The superficial great saphenous vein was also interrogated. Spectral Doppler was utilized to evaluate flow at rest and with distal augmentation maneuvers in the common femoral, femoral and popliteal veins. COMPARISON:  None Available. FINDINGS: RIGHT LOWER EXTREMITY Common Femoral Vein: No evidence of thrombus. Normal compressibility, respiratory phasicity and response to augmentation. Saphenofemoral Junction: No evidence of thrombus. Normal compressibility and flow on color Doppler imaging. Profunda Femoral Vein: No evidence of thrombus. Normal compressibility and flow on color Doppler imaging. Femoral Vein:  No evidence of thrombus. Normal compressibility, respiratory phasicity and response to augmentation. Popliteal Vein: No evidence of thrombus. Normal compressibility, respiratory phasicity and response to augmentation. Calf Veins: No evidence of thrombus. Normal compressibility and flow on color Doppler imaging. Superficial Great Saphenous Vein: No evidence of thrombus. Normal compressibility. Venous Reflux:  None. Other Findings:  None. LEFT LOWER EXTREMITY Common Femoral Vein: No evidence of thrombus. Normal compressibility, respiratory phasicity and response to augmentation. Saphenofemoral Junction: No evidence of thrombus. Normal compressibility and flow on color Doppler imaging. Profunda Femoral Vein: No evidence of thrombus. Normal compressibility and flow on color Doppler imaging. Femoral Vein: No evidence of thrombus. Normal compressibility, respiratory phasicity and response to augmentation. Popliteal Vein: No evidence of thrombus. Normal compressibility, respiratory phasicity and response to augmentation. Calf Veins: No evidence of thrombus. Normal compressibility and flow on color Doppler imaging. Superficial Great Saphenous Vein: No evidence of thrombus. Normal compressibility. Venous Reflux:  None. Other Findings:  None. IMPRESSION: No evidence of deep venous thrombosis in either lower extremity. Electronically Signed   By: Malachy Moan M.D.   On: 07/07/2023 13:28        ASSESSMENT and PLAN:  1.  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 - Discussed NGS test results which showed strong positivity for PDL 1 (22 C3) with TPS score  75%.  This shows high likelihood of response to immunotherapy. 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  Hence I will use double immunotherapy with anti-CTL A4 antibody and PD-1 antibody.  We will use chemotherapy with  carboplatin and pemetrexed for 2 cycles to get quick response. - We may likely start her treatment next week.  She will continue folic acid 1 mg tablet daily. - Will discuss with Dr. Arbutus Leas.  2.  Postobstructive/lobar pneumonia: - Presentation with fever, tachycardia and tachypnea - CT angiogram of the chest on 07/06/2023: Negative for PE.  Interval increase in left lower lobe mass with resultant postobstructive pneumonia. - Continue broad-spectrum antibiotics with vancomycin, aztreonam and metronidazole.  3.  Lower extremity edema: - Doppler for DVT was negative. - Most likely from hypoalbuminemia. - May resume diuresis with Lasix.  All questions were answered. The patient knows to call the clinic with any problems, questions or concerns. We can certainly see the patient much sooner if necessary.   [name]

## 2023-07-08 NOTE — Progress Notes (Signed)
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   [id number]:Carboplatin IV D1,22 + Pemetrexed IV D1,22 + Ipilimumab IV D1 + Nivolumab IV D1,22 q42 Days x 1 Cycle Followed by Ipilimumab IV D1 + Nivolumab IV D1,22 q42 Days:   Cycle 1: A cycle is 42 days:     Nivolumab      Ipilimumab      Carboplatin      Pemetrexed    Cycles 2 and beyond: A cycle is every 42 days:     Nivolumab      Ipilimumab   **Always confirm dose/schedule in your pharmacy ordering system**  Patient Characteristics: Stage IV Metastatic, Nonsquamous, Molecular Analysis Completed, Molecular Alteration Present and Targeted Therapy Exhausted OR KRAS G12C+ or HER2+ Present and No Prior Chemo/Immunotherapy OR No Alteration Present, Initial Chemotherapy/Immunotherapy, PS =  0, 1, No Alteration Present, No Alteration Present, Candidate for Immunotherapy, PD-L1 Expression Positive  ? 50% (TPS) and Immunotherapy Candidate Therapeutic Status: Stage IV Metastatic Histology: Nonsquamous Cell Broad Molecular Profiling Status: Animal nutritionist Analysis Results: No Alteration Present ECOG Performance Status: 1 Chemotherapy/Immunotherapy Line of Therapy: Initial Chemotherapy/Immunotherapy EGFR Exons 18-21 Mutation Testing Status: Completed and Negative ALK Fusion/Rearrangement Testing Status: Completed and Negative BRAF V600 Mutation Testing Status: Completed and Negative KRAS G12C Mutation Testing Status: Completed and Negative MET Exon 14 Mutation Testing Status: Completed and Negative RET Fusion/Rearrangement Testing Status: Completed and Negative HER2 Mutation Testing Status: Completed and Negative NTRK Fusion/Rearrangement Testing Status: Completed and Negative ROS1 Fusion/Rearrangement Testing Status: Completed and Negative Immunotherapy Candidate Status: Candidate for Immunotherapy PD-L1 Expression Status: PD-L1 Positive ? 50% (TPS) Intent of Therapy: Non-Curative / Palliative Intent, Discussed with  Patient

## 2023-07-08 NOTE — Plan of Care (Signed)
  Problem: Education: Goal: Knowledge of General Education information will improve Description: Including pain rating scale, medication(s)/side effects and non-pharmacologic comfort measures Outcome: Progressing   Problem: Activity: Goal: Risk for activity intolerance will decrease Outcome: Progressing   Problem: Coping: Goal: Level of anxiety will decrease Outcome: Progressing   

## 2023-07-08 NOTE — Progress Notes (Signed)
   07/08/23 1006  Vitals  Temp 98.4 F (36.9 C)  Temp Source Oral  BP 126/64  MAP (mmHg) 83  BP Location Left Arm  BP Method Automatic  Patient Position (if appropriate) Lying  Pulse Rate (!) 105  Pulse Rate Source Dinamap  Level of Consciousness  Level of Consciousness Alert  MEWS COLOR  MEWS Score Color Yellow  Oxygen Therapy  SpO2 96 %  O2 Device Room Air  MEWS Score  MEWS Temp 0  MEWS Systolic 0  MEWS Pulse 1  MEWS RR 1  MEWS LOC 0  MEWS Score 2  Provider Notification  Provider Name/Title Dr Tat  Date Provider Notified 07/08/23  Time Provider Notified 1043  Method of Notification Page  Notification Reason Other (Comment) (hr 105)  Date Critical Result Received 07/08/23  Time Critical Result Received 1043  Provider response No new orders

## 2023-07-08 NOTE — Progress Notes (Signed)
Pt slept very little during shift. Pt complained of nausea and vomiting. Nausea relieved by medication.

## 2023-07-08 NOTE — Progress Notes (Signed)
   07/08/23 1120  Spiritual Encounters  Type of Visit Initial  Care provided to: Pt and family  Referral source Other (comment) (Spiritual Consult)  Reason for visit Advance directives  OnCall Visit No   Chaplain responded to a spiritual consult for advanced directive education. The patient, Miranda Perkins, express interest in the purpose of the documents. I went over the individual forms and the reflective prep work that is outlined at the front of the package. Perkins appreciated the opportunity to do the reflective work and understood how it contributes to the selection process of the Living Will and the [facility] [place]. Miranda Perkins and her husband intend discuss the documents and determine if they would be helpful.   Valerie Roys [facility]  [phone]

## 2023-07-08 NOTE — Progress Notes (Signed)
  Echocardiogram 2D Echocardiogram has been performed.  Miranda Perkins 07/08/2023, 9:27 AM

## 2023-07-09 ENCOUNTER — Inpatient Hospital Stay

## 2023-07-09 ENCOUNTER — Other Ambulatory Visit: Payer: Self-pay

## 2023-07-09 DIAGNOSIS — J189 Pneumonia, unspecified organism: Secondary | ICD-10-CM | POA: Diagnosis not present

## 2023-07-09 DIAGNOSIS — R6520 Severe sepsis without septic shock: Secondary | ICD-10-CM | POA: Diagnosis not present

## 2023-07-09 DIAGNOSIS — C3492 Malignant neoplasm of unspecified part of left bronchus or lung: Secondary | ICD-10-CM | POA: Diagnosis not present

## 2023-07-09 DIAGNOSIS — A419 Sepsis, unspecified organism: Secondary | ICD-10-CM | POA: Diagnosis not present

## 2023-07-09 LAB — CBC
HCT: 30.9 % — ABNORMAL LOW (ref 36.0–46.0)
Hemoglobin: 9.3 g/dL — ABNORMAL LOW (ref 12.0–15.0)
MCH: 25.1 pg — ABNORMAL LOW (ref 26.0–34.0)
MCHC: 30.1 g/dL (ref 30.0–36.0)
MCV: 83.3 fL (ref 80.0–100.0)
Platelets: 390 10*3/uL (ref 150–400)
RBC: 3.71 MIL/uL — ABNORMAL LOW (ref 3.87–5.11)
RDW: 16.5 % — ABNORMAL HIGH (ref 11.5–15.5)
WBC: 10 10*3/uL (ref 4.0–10.5)
nRBC: 0 % (ref 0.0–0.2)

## 2023-07-09 LAB — BASIC METABOLIC PANEL
Anion gap: 10 (ref 5–15)
BUN: 8 mg/dL (ref 8–23)
CO2: 23 mmol/L (ref 22–32)
Calcium: 8 mg/dL — ABNORMAL LOW (ref 8.9–10.3)
Chloride: 101 mmol/L (ref 98–111)
Creatinine, Ser: 0.51 mg/dL (ref 0.44–1.00)
GFR, Estimated: >60 mL/min (ref >60)
Glucose, Bld: 96 mg/dL (ref 70–99)
Potassium: 3.4 mmol/L — ABNORMAL LOW (ref 3.5–5.1)
Sodium: 134 mmol/L — ABNORMAL LOW (ref 135–145)

## 2023-07-09 LAB — MAGNESIUM: Magnesium: 1.9 mg/dL (ref 1.7–2.4)

## 2023-07-09 MED ORDER — GUAIFENESIN-DM 100-10 MG/5ML PO SYRP
5.0000 mL | ORAL_SOLUTION | ORAL | Status: DC | PRN
  Administered 2023-07-10 (×2): 5 mL via ORAL
  Filled 2023-07-09 (×2): qty 5

## 2023-07-09 MED ORDER — OXYCODONE-ACETAMINOPHEN 5-325 MG PO TABS
1.0000 | ORAL_TABLET | ORAL | Status: DC | PRN
  Administered 2023-07-09 – 2023-07-10 (×2): 1 via ORAL
  Filled 2023-07-09 (×2): qty 1

## 2023-07-09 MED ORDER — OXYCODONE HCL 5 MG PO TABS
10.0000 mg | ORAL_TABLET | ORAL | Status: DC | PRN
  Administered 2023-07-09: 10 mg via ORAL
  Filled 2023-07-09: qty 2

## 2023-07-09 MED ORDER — POTASSIUM CHLORIDE CRYS ER 20 MEQ PO TBCR
20.0000 meq | EXTENDED_RELEASE_TABLET | Freq: Once | ORAL | Status: AC
  Administered 2023-07-09: 20 meq via ORAL
  Filled 2023-07-09: qty 1

## 2023-07-09 MED ORDER — MAGNESIUM SULFATE IN D5W 1-5 GM/100ML-% IV SOLN
1.0000 g | Freq: Once | INTRAVENOUS | Status: AC
  Administered 2023-07-09: 1 g via INTRAVENOUS
  Filled 2023-07-09: qty 100

## 2023-07-09 NOTE — Progress Notes (Signed)
Will recheck

## 2023-07-09 NOTE — Progress Notes (Signed)
VS rechecked patient a green MEWS at this time

## 2023-07-09 NOTE — Plan of Care (Signed)

## 2023-07-09 NOTE — Progress Notes (Addendum)
PROGRESS NOTE  Miranda Perkins MWU:[id number] DOB: 09-Dec-1960 DOA: 07/06/2023 PCP: [name], DO  Brief History:   62 y.o. female with medical history significant for metastatic lung adenocarcinom , CVA, adrenal hemorrhage, and c diff colitis presents with 4 days of sob and cough.  The patient had some fevers and chills.  She stated her temperature was up to 99.9 F.  She has had some posttussive emesis at home.  In addition, she has been complaining of increasing shortness of breath over the past 4 days with lower extremity edema.  She has had a small amount of blood-tinged sputum.  Patient was recently hospitalized 8/18 to 8/24 for persistent diarrhea from C. difficile infection after she had completed 10 days of Dificid.  On discharge symptoms solved, and she was discharged home to complete 10 days of oral vancomycin   She continues to have some vaginal bleeding.  She states that her dose of furosemide was recently increased to 40 mg daily.  She continues to have lower extremity edema.  ED Course: Tmax of 102.2, initial tachycardia to 135 improved after fluids now low 110s.  Respirate rate 18-22.  Blood pressure systolic 98-115.  O2 sats 93 to 95% on room air.  WBC 12.  Lactic acid 2.2 > 1.4.  COVID test negative. CTA chest negative for PE,  Interval increase in size of left lung lower lobe mass with resultant postobstructive pneumonia versus atelectasis of the left lung lower lobe. Increasing right pleural effusion. Vancomycin, aztreonam and metronidazole started. MRSA was negative.  Vanco was discontinued and she was transitioned to cefepime and metronidazole which she tolerated.   Assessment/Plan:  Lobar pneumonia/postobstructive pneumonia -07/06/2023 CTA chest negative for PE; interval increased in LLL mass with resultant postobstructive PNA versus atelectasis -initially started vancomycin, aztreonam, metronidazole -CT abd/ pelvis-Stable right adrenal mass; Stable  thickening of the endometrium with no focal mass -Check PCT 4/81>>4.44 -d/c vancomycin (MRSA neg) -tolerating cefepime and metronidazole -remains stable on RA   Severe sepsis Presented with fever, tachycardia, leukocytosis -Lactic acid 2.2>> 1.4 -Follow blood cultures--neg to date -continue cefepime and metronidazole   Recent history of C. difficile colitis -Restarted oral vancomycin while on antibiotics for pneumonia   Lower extremity edema -Likely secondary to low albumin -Albumin has dropped from 4.6-2.3 in the last 2 months -Venous duplex--neg for DVT -Hold furosemide temporarily -9/5 echo EF >75%, no WMA, normal RVF; IVC small suggesting hypovolemia   Hypokalemia -Repleting -Magnesium 1.9   Metastatic primary lung cancer, left (HCC) Follows with Dr. 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  Just completed 5 treatments of radiation therapy on adrenal mass on 07/02/23.  --appreciate Dr. Ellin Saba consult>>treat PNA and plan to start immunotherapy next week;  hold off additional XRT for now -discussed with Dr. Ellin Saba   Class 2 Obesity -lifestyle modfication -BMI 38.07               Family Communication:  no  Family at bedside   Consultants:  none   Code Status:  FULL    DVT Prophylaxis:  [place] Lovenox     Procedures: As Listed in Progress Note Above   Antibiotics: Vanc IV 9/3>>9/4 Aztreonam 9/3>>9/4 Cefepime 9/4>> Metronidazole 9/3>>    Subjective: Patient states she is tolerating her diet.  She states that her "spitting up" is a little better.  No frank emesis.  Denies cp, abd pain.  Feels that her breathing is improving slowly.  Objective: Vitals:   07/09/23 0537 07/09/23 1433 07/09/23 1434 07/09/23 1446  BP: 121/73 (!) 123/58    Pulse: (!) 102 (!) 110    Resp: 20 (!) 24 (!) 24 20  Temp: 98.5 F (36.9 C) 99 F (37.2 C)     TempSrc: Oral Oral    SpO2: 97% 96%    Weight:      Height:        Intake/Output Summary (Last 24 hours) at 07/09/2023 1554 Last data filed at 07/09/2023 0914 Gross per 24 hour  Intake 1783.73 ml  Output --  Net 1783.73 ml   Weight change: -0.7 kg Exam:  General:  Pt is alert, follows commands appropriately, not in acute distress HEENT: No icterus, No thrush, No neck mass, [place]/AT Cardiovascular: RRR, S1/S2, no rubs, no gallops Respiratory: diminished BS at bases.  No wheezing.  Bibasilar rales. Abdomen: Soft/+BS, non tender, non distended, no guarding Extremities: 2 + LE edema, No lymphangitis, No petechiae, No rashes, no synovitis   Data Reviewed: I have personally reviewed following labs and imaging studies Basic Metabolic Panel: Recent Labs  Lab 07/06/23 1305 07/07/23 0436 07/08/23 0430 07/09/23 0432  NA 133* 133* 135 134*  K 3.5 3.1* 3.5 3.4*  CL 97* 98 101 101  CO2 24 25 24 23   GLUCOSE 166* 104* 97 96  BUN 14 11 10 8   CREATININE 0.75 0.59 0.52 0.51  CALCIUM 8.2* 7.9* 8.0* 8.0*  MG 2.0  --   --  1.9   Liver Function Tests: Recent Labs  Lab 07/06/23 1305 07/08/23 0430  AST 66* 15  ALT 53* 26  ALKPHOS 168* 121  BILITOT 0.8 0.6  PROT 6.3* 5.1*  ALBUMIN 2.3* 1.9*   No results for input(s): "LIPASE", "AMYLASE" in the last 168 hours. No results for input(s): "AMMONIA" in the last 168 hours. Coagulation Profile: Recent Labs  Lab 07/06/23 1443  INR 1.3*   CBC: Recent Labs  Lab 07/06/23 1305 07/07/23 0436 07/08/23 0430 07/09/23 0432  WBC 12.2* 9.9 9.3 10.0  NEUTROABS 10.9*  --   --   --   HGB 11.7* 9.6* 9.3* 9.3*  HCT 37.7 30.8* 30.7* 30.9*  MCV 81.1 81.7 83.2 83.3  PLT 527* 416* 411* 390   Cardiac Enzymes: No results for input(s): "CKTOTAL", "CKMB", "CKMBINDEX", "TROPONINI" in the last 168 hours. BNP: Invalid input(s): "POCBNP" CBG: No results for input(s): "GLUCAP" in the last 168 hours. HbA1C: No results for input(s): "HGBA1C" in the last  72 hours. Urine analysis:    Component Value Date/Time   COLORURINE YELLOW 06/11/2023 0526   APPEARANCEUR CLEAR 06/11/2023 0526   APPEARANCEUR Clear 04/06/2023 1106   LABSPEC >1.046 (H) 06/11/2023 0526   PHURINE 7.0 06/11/2023 0526   GLUCOSEU NEGATIVE 06/11/2023 0526   HGBUR NEGATIVE 06/11/2023 0526   BILIRUBINUR NEGATIVE 06/11/2023 0526   BILIRUBINUR Negative 04/06/2023 1106   KETONESUR 20 (A) 06/11/2023 0526   PROTEINUR NEGATIVE 06/11/2023 0526   NITRITE NEGATIVE 06/11/2023 0526   LEUKOCYTESUR NEGATIVE 06/11/2023 0526   Sepsis Labs: @LABRCNTIP (procalcitonin:4,lacticidven:4) ) Recent Results (from the past 240 hour(s))  SARS Coronavirus 2 by RT PCR (hospital order, performed in [facility] hospital lab) *cepheid single result test* Anterior Nasal Swab     Status: None   Collection Time: 07/06/23  2:20 PM   Specimen: Anterior Nasal Swab  Result Value Ref Range Status   SARS Coronavirus 2 by RT PCR  NEGATIVE NEGATIVE Final    Comment: (NOTE) SARS-CoV-2 target nucleic acids are NOT DETECTED.  The SARS-CoV-2 RNA is generally detectable in upper and lower respiratory specimens during the acute phase of infection. The lowest concentration of SARS-CoV-2 viral copies this assay can detect is 250 copies / mL. A negative result does not preclude SARS-CoV-2 infection and should not be used as the sole basis for treatment or other patient management decisions.  A negative result may occur with improper specimen collection / handling, submission of specimen other than nasopharyngeal swab, presence of viral mutation(s) within the areas targeted by this assay, and inadequate number of viral copies (<250 copies / mL). A negative result must be combined with clinical observations, patient history, and epidemiological information.  Fact Sheet for Patients:   RoadLapTop.co.za  Fact Sheet for Healthcare Providers: http://kim-miller.com/  This  test is not yet approved or  cleared by the [place] FDA and has been authorized for detection and/or diagnosis of SARS-CoV-2 by FDA under an Emergency Use Authorization (EUA).  This EUA will remain in effect (meaning this test can be used) for the duration of the COVID-19 declaration under Section 564(b)(1) of the Act, 21 U.S.C. section 360bbb-3(b)(1), unless the authorization is terminated or revoked sooner.  Performed at [facility], [address], [place], [place] [address]   Blood Culture (routine x 2)     Status: None (Preliminary result)   Collection Time: 07/06/23  2:43 PM   Specimen: BLOOD  Result Value Ref Range Status   Specimen Description BLOOD BLOOD RIGHT ARM  Final   Special Requests   Final    BOTTLES DRAWN AEROBIC AND ANAEROBIC Blood Culture adequate volume   Culture   Final    NO GROWTH 3 DAYS Performed at [facility], [address], [place], [place] [address]    Report Status PENDING  Incomplete  Blood Culture (routine x 2)     Status: None (Preliminary result)   Collection Time: 07/06/23  2:43 PM   Specimen: BLOOD  Result Value Ref Range Status   Specimen Description BLOOD BLOOD LEFT ARM  Final   Special Requests   Final    BOTTLES DRAWN AEROBIC AND ANAEROBIC Blood Culture adequate volume   Culture   Final    NO GROWTH 3 DAYS Performed at [facility], [address], [place], [place] [address]    Report Status PENDING  Incomplete  MRSA Next Gen by PCR, Nasal     Status: None   Collection Time: 07/07/23  8:58 AM   Specimen: Nasal Mucosa; Nasal Swab  Result Value Ref Range Status   MRSA by PCR Next Gen NOT DETECTED NOT DETECTED Final    Comment: (NOTE) The GeneXpert MRSA Assay (FDA approved for NASAL specimens only), is one component of a comprehensive MRSA colonization surveillance program. It is not intended to diagnose MRSA infection nor to guide or monitor treatment for MRSA infections. Test performance is not FDA approved in patients less than  12 years old. Performed at [facility], [address] [place], [place] [address]      Scheduled Meds:  Chlorhexidine Gluconate Cloth  6 each Topical Daily   enoxaparin (LOVENOX) injection  50 mg Subcutaneous Q24H   folic acid  1 mg Oral Daily   melatonin  6 mg Oral QHS   potassium chloride  20 mEq Oral Once   progesterone  200 mg Oral QHS   saccharomyces boulardii  250 mg Oral BID   vancomycin  125 mg Oral QID   Continuous Infusions:  ceFEPime (MAXIPIME) IV 2 g (07/09/23 0508)   lactated ringers 75 mL/hr at 07/08/23 1821   metronidazole 500 mg (07/09/23 1506)    Procedures/Studies: ECHOCARDIOGRAM COMPLETE  Result Date: 07/08/2023    ECHOCARDIOGRAM REPORT   Patient Name:   Miranda Perkins Labate Date of Exam: 07/08/2023 Medical Rec #:  [id number]          Height:       66.0 in Accession #:    [id number]         Weight:       237.4 lb Date of Birth:  1961/06/29          BSA:          2.151 m Patient Age:    62 years           BP:           11/71 mmHg Patient Gender: F                  HR:           103 bpm. [facility] Procedure: 2D Echo, Cardiac Doppler and Color Doppler Indications:    Swelling of lower extremity [[id number]]  History:        Patient has prior history of Echocardiogram examinations, most                 recent 01/24/2021. Lung Cancer and Stroke; Signs/Symptoms:Edema.                 Sepsis.  Sonographer:    Aron Baba Referring Phys: [id number] Onnie Boer  Sonographer Comments: Patient is obese. Image acquisition challenging due to respiratory motion. IMPRESSIONS  1. Left ventricular ejection fraction, by estimation, is >75%. The left ventricle has hyperdynamic function. The left ventricle has no regional wall motion abnormalities. Left ventricular diastolic parameters are indeterminate.  2. Right ventricular systolic function is normal. The right ventricular size is mildly enlarged. There is mildly elevated pulmonary artery systolic pressure.  3. The mitral valve  is normal in structure. No evidence of mitral valve regurgitation. No evidence of mitral stenosis.  4. The tricuspid valve is abnormal.  5. The aortic valve is tricuspid. Aortic valve regurgitation is not visualized. No aortic stenosis is present.  6. IVC is small suggesting low RA pressure and hypovolemia. FINDINGS  Left Ventricle: Hyperdynamic LV function creates a mild dynamic LVOT gradient, peak gradient 15 mmHg. Left ventricular ejection fraction, by estimation, is >75%. The left ventricle has hyperdynamic function. The left ventricle has no regional wall motion abnormalities. The left ventricular internal cavity size was normal in size. There is no left ventricular hypertrophy. Left ventricular diastolic parameters are indeterminate. Right Ventricle: The right ventricular size is mildly enlarged. Right vetricular wall thickness was not well visualized. Right ventricular systolic function is normal. There is mildly elevated pulmonary artery systolic pressure. The tricuspid regurgitant  velocity is 2.87 m/s, and with an assumed right atrial pressure of 8 mmHg, the estimated right ventricular systolic pressure is 40.9 mmHg. Left Atrium: Left atrial size was normal in size. Right Atrium: Right atrial size was normal in size. Pericardium: There is no evidence of pericardial effusion. Mitral Valve: The mitral valve is normal in structure. No evidence of mitral valve regurgitation. No evidence of mitral valve stenosis. Tricuspid Valve: The tricuspid valve is abnormal. Tricuspid valve regurgitation is mild . No evidence of tricuspid stenosis. Aortic Valve: The  aortic valve is tricuspid. Aortic valve regurgitation is not visualized. No aortic stenosis is present. Aortic valve mean gradient measures 6.9 mmHg. Aortic valve peak gradient measures 12.6 mmHg. Aortic valve area, by VTI measures 2.31  cm. Pulmonic Valve: The pulmonic valve was not well visualized. Pulmonic valve regurgitation is not visualized. No evidence  of pulmonic stenosis. Aorta: The aortic root is normal in size and structure. Venous: IVC is small suggesting low RA pressure and hypovolemia. IAS/Shunts: No atrial level shunt detected by color flow Doppler.  LEFT VENTRICLE PLAX 2D LVIDd:         4.20 cm   Diastology LVIDs:         2.40 cm   LV e' medial:    9.95 cm/s LV PW:         0.90 cm   LV E/e' medial:  7.4 LV IVS:        0.90 cm   LV e' lateral:   13.50 cm/s LVOT diam:     1.90 cm   LV E/e' lateral: 5.4 LV SV:         61 LV SV Index:   28 LVOT Area:     2.84 cm  RIGHT VENTRICLE RV S prime:     26.70 cm/s TAPSE (M-mode): 2.4 cm LEFT ATRIUM           Index        RIGHT ATRIUM           Index LA diam:      2.80 cm 1.30 cm/m   RA Area:     13.80 cm LA Vol (A2C): 15.1 ml 7.02 ml/m   RA Volume:   33.80 ml  15.71 ml/m LA Vol (A4C): 37.4 ml 17.39 ml/m  AORTIC VALVE AV Area (Vmax):    2.03 cm AV Area (Vmean):   2.00 cm AV Area (VTI):     2.31 cm AV Vmax:           177.14 cm/s AV Vmean:          120.945 cm/s AV VTI:            0.265 m AV Peak Grad:      12.6 mmHg AV Mean Grad:      6.9 mmHg LVOT Vmax:         127.00 cm/s LVOT Vmean:        85.300 cm/s LVOT VTI:          0.216 m LVOT/AV VTI ratio: 0.81  AORTA Ao Root diam: 2.90 cm Ao Asc diam:  3.20 cm MITRAL VALVE               TRICUSPID VALVE MV Area (PHT): 4.29 cm    TR Peak grad:   32.9 mmHg MV Decel Time: 177 msec    TR Vmax:        287.00 cm/s MR Peak grad: 4.9 mmHg MR Vmax:      111.00 cm/s  SHUNTS MV E velocity: 73.30 cm/s  Systemic VTI:  0.22 m MV A velocity: 84.20 cm/s  Systemic Diam: 1.90 cm MV E/A ratio:  0.87 Dina Rich MD Electronically signed by Dina Rich MD Signature Date/Time: 07/08/2023/9:47:10 AM    Final    US Venous Img Lower Bilateral (DVT)  Result Date: 07/07/2023 CLINICAL DATA:  Bilateral lower extremity edema EXAM: BILATERAL LOWER EXTREMITY VENOUS DOPPLER ULTRASOUND TECHNIQUE: Gray-scale sonography with graded compression, as well as color Doppler and duplex ultrasound  were performed to  evaluate the lower extremity deep venous systems from the level of the common femoral vein and including the common femoral, femoral, profunda femoral, popliteal and calf veins including the posterior tibial, peroneal and gastrocnemius veins when visible. The superficial great saphenous vein was also interrogated. Spectral Doppler was utilized to evaluate flow at rest and with distal augmentation maneuvers in the common femoral, femoral and popliteal veins. COMPARISON:  None Available. FINDINGS: RIGHT LOWER EXTREMITY Common Femoral Vein: No evidence of thrombus. Normal compressibility, respiratory phasicity and response to augmentation. Saphenofemoral Junction: No evidence of thrombus. Normal compressibility and flow on color Doppler imaging. Profunda Femoral Vein: No evidence of thrombus. Normal compressibility and flow on color Doppler imaging. Femoral Vein: No evidence of thrombus. Normal compressibility, respiratory phasicity and response to augmentation. Popliteal Vein: No evidence of thrombus. Normal compressibility, respiratory phasicity and response to augmentation. Calf Veins: No evidence of thrombus. Normal compressibility and flow on color Doppler imaging. Superficial Great Saphenous Vein: No evidence of thrombus. Normal compressibility. Venous Reflux:  None. Other Findings:  None. LEFT LOWER EXTREMITY Common Femoral Vein: No evidence of thrombus. Normal compressibility, respiratory phasicity and response to augmentation. Saphenofemoral Junction: No evidence of thrombus. Normal compressibility and flow on color Doppler imaging. Profunda Femoral Vein: No evidence of thrombus. Normal compressibility and flow on color Doppler imaging. Femoral Vein: No evidence of thrombus. Normal compressibility, respiratory phasicity and response to augmentation. Popliteal Vein: No evidence of thrombus. Normal compressibility, respiratory phasicity and response to augmentation. Calf Veins: No evidence of  thrombus. Normal compressibility and flow on color Doppler imaging. Superficial Great Saphenous Vein: No evidence of thrombus. Normal compressibility. Venous Reflux:  None. Other Findings:  None. IMPRESSION: No evidence of deep venous thrombosis in either lower extremity. Electronically Signed   By: Malachy Moan M.D.   On: 07/07/2023 13:28   CT Angio Chest PE W and/or Wo Contrast  Result Date: 07/06/2023 CLINICAL DATA:  Sepsis; Pulmonary embolism (PE) suspected, high prob. Shortness of breath. Edema. History of lung carcinoma. Urinary incontinence. Burning with urination. * Tracking Code: BO * EXAM: CT ANGIOGRAPHY CHEST CT ABDOMEN AND PELVIS WITH CONTRAST TECHNIQUE: Multidetector CT imaging of the chest was performed using the standard protocol during bolus administration of intravenous contrast. Multiplanar CT image reconstructions and MIPs were obtained to evaluate the vascular anatomy. Multidetector CT imaging of the abdomen and pelvis was performed using the standard protocol during bolus administration of intravenous contrast. RADIATION DOSE REDUCTION: This exam was performed according to the departmental dose-optimization program which includes automated exposure control, adjustment of the mA and/or kV according to patient size and/or use of iterative reconstruction technique. CONTRAST:  OMNIPAQUE IOHEXOL 350 MG/ML SOLN COMPARISON:  CT scan abdomen and pelvis from 06/11/2023 and CT angiography chest from 05/20/2023. FINDINGS: CTA CHEST FINDINGS Cardiovascular: No evidence of embolism to the proximal subsegmental pulmonary artery level. Please note, left lung lower lobe pulmonary artery is compressed by patient's pre-existing left lung lower lobe mass however, there is no embolism within. Normal cardiac size. No pericardial effusion. No aortic aneurysm. Mediastinum/Nodes: Visualized thyroid gland appears grossly unremarkable. No solid / cystic mediastinal masses. The esophagus is nondistended  precluding optimal assessment. No axillary, mediastinal or hilar lymphadenopathy by size criteria. Lungs/Pleura: The trachea and right bronchial tree is patent. There is abrupt cutoff of left lower lobe bronchial tree. There is near complete collapse of the left lung lower lobe except few small areas of aeration of the superior segment, posteriorly. There is heterogeneous left mass however, accurate  measurement is difficult on this exam however, there is significant interval increase in the size when compared to the prior exam from 05/20/2023. There is resultant postobstructive pneumonia versus atelectasis of the left lung lower lobe. There is small right pleural effusion, new since the prior study. There are associated compressive atelectatic changes in the right lung lower lobe. No right lung mass or consolidation. No suspicious lung nodules. Musculoskeletal: A CT Port-a-Cath is seen in the left upper chest wall with the catheter terminating in the cavo-atrial junction region. Visualized soft tissues of the chest wall are otherwise grossly unremarkable. No suspicious osseous lesions. There are mild multilevel degenerative changes in the visualized spine. Review of the MIP images confirms the above findings. CT ABDOMEN and PELVIS FINDINGS Hepatobiliary: The liver is normal in size. Non-cirrhotic configuration. No suspicious mass. No intrahepatic or extrahepatic bile duct dilation. No calcified gallstones. Normal gallbladder wall thickness. No pericholecystic inflammatory changes. Pancreas: Unremarkable. No pancreatic ductal dilatation or surrounding inflammatory changes. Spleen: Within normal limits. No focal lesion. Adrenals/Urinary Tract: Redemonstration of markedly enlarged right adrenal gland measuring 8.1 x 10.3 cm, similar to the prior study. There is hyperattenuating peripheral wall with central hypoattenuating areas and intervening hyperattenuating septations. No significant interval change since the prior  study. Unremarkable left adrenal gland. No suspicious renal mass. There is a stable partially exophytic 5.2 x 7.9 cm cyst arising from the right kidney upper pole, posteriorly. No hydronephrosis. No renal or ureteric calculi. Unremarkable urinary bladder. Stomach/Bowel: No disproportionate dilation of the small or large bowel loops. No evidence of abnormal bowel wall thickening or inflammatory changes. The appendix was not confidently visualized; however there is no acute inflammatory process in the right lower quadrant. Vascular/Lymphatic: No ascites or pneumoperitoneum. No abdominal or pelvic lymphadenopathy, by size criteria. No aneurysmal dilation of the major abdominal arteries. There are mild peripheral atherosclerotic vascular calcifications of the aorta and its major branches. Reproductive: Normal-size anteverted uterus. Redemonstration of thickened endometrium measuring up to 9 mm, without discrete focal mass. No large adnexal mass. Bilateral fallopian tube closure devices noted. Other: There is a tiny fat containing umbilical hernia. The soft tissues and abdominal wall are otherwise unremarkable. Musculoskeletal: No suspicious osseous lesions. There are mild multilevel degenerative changes in the visualized spine. Review of the MIP images confirms the above findings. IMPRESSION: 1. No evidence of pulmonary embolism to the proximal subsegmental pulmonary artery level. 2. Interval increase in size of left lung lower lobe mass with resultant postobstructive pneumonia versus atelectasis of the left lung lower lobe. Increasing right pleural effusion. 3. Stable right adrenal mass. 4. Stable thickened endometrium.  No focal mass. 5. Multiple other nonacute observations, as described above. Electronically Signed   By: Jules Schick M.D.   On: 07/06/2023 17:15   CT ABDOMEN PELVIS W CONTRAST  Result Date: 07/06/2023 CLINICAL DATA:  Sepsis; Pulmonary embolism (PE) suspected, high prob. Shortness of breath. Edema.  History of lung carcinoma. Urinary incontinence. Burning with urination. * Tracking Code: BO * EXAM: CT ANGIOGRAPHY CHEST CT ABDOMEN AND PELVIS WITH CONTRAST TECHNIQUE: Multidetector CT imaging of the chest was performed using the standard protocol during bolus administration of intravenous contrast. Multiplanar CT image reconstructions and MIPs were obtained to evaluate the vascular anatomy. Multidetector CT imaging of the abdomen and pelvis was performed using the standard protocol during bolus administration of intravenous contrast. RADIATION DOSE REDUCTION: This exam was performed according to the departmental dose-optimization program which includes automated exposure control, adjustment of the mA and/or kV according  to patient size and/or use of iterative reconstruction technique. CONTRAST:  OMNIPAQUE IOHEXOL 350 MG/ML SOLN COMPARISON:  CT scan abdomen and pelvis from 06/11/2023 and CT angiography chest from 05/20/2023. FINDINGS: CTA CHEST FINDINGS Cardiovascular: No evidence of embolism to the proximal subsegmental pulmonary artery level. Please note, left lung lower lobe pulmonary artery is compressed by patient's pre-existing left lung lower lobe mass however, there is no embolism within. Normal cardiac size. No pericardial effusion. No aortic aneurysm. Mediastinum/Nodes: Visualized thyroid gland appears grossly unremarkable. No solid / cystic mediastinal masses. The esophagus is nondistended precluding optimal assessment. No axillary, mediastinal or hilar lymphadenopathy by size criteria. Lungs/Pleura: The trachea and right bronchial tree is patent. There is abrupt cutoff of left lower lobe bronchial tree. There is near complete collapse of the left lung lower lobe except few small areas of aeration of the superior segment, posteriorly. There is heterogeneous left mass however, accurate measurement is difficult on this exam however, there is significant interval increase in the size when compared to  the prior exam from 05/20/2023. There is resultant postobstructive pneumonia versus atelectasis of the left lung lower lobe. There is small right pleural effusion, new since the prior study. There are associated compressive atelectatic changes in the right lung lower lobe. No right lung mass or consolidation. No suspicious lung nodules. Musculoskeletal: A CT Port-a-Cath is seen in the left upper chest wall with the catheter terminating in the cavo-atrial junction region. Visualized soft tissues of the chest wall are otherwise grossly unremarkable. No suspicious osseous lesions. There are mild multilevel degenerative changes in the visualized spine. Review of the MIP images confirms the above findings. CT ABDOMEN and PELVIS FINDINGS Hepatobiliary: The liver is normal in size. Non-cirrhotic configuration. No suspicious mass. No intrahepatic or extrahepatic bile duct dilation. No calcified gallstones. Normal gallbladder wall thickness. No pericholecystic inflammatory changes. Pancreas: Unremarkable. No pancreatic ductal dilatation or surrounding inflammatory changes. Spleen: Within normal limits. No focal lesion. Adrenals/Urinary Tract: Redemonstration of markedly enlarged right adrenal gland measuring 8.1 x 10.3 cm, similar to the prior study. There is hyperattenuating peripheral wall with central hypoattenuating areas and intervening hyperattenuating septations. No significant interval change since the prior study. Unremarkable left adrenal gland. No suspicious renal mass. There is a stable partially exophytic 5.2 x 7.9 cm cyst arising from the right kidney upper pole, posteriorly. No hydronephrosis. No renal or ureteric calculi. Unremarkable urinary bladder. Stomach/Bowel: No disproportionate dilation of the small or large bowel loops. No evidence of abnormal bowel wall thickening or inflammatory changes. The appendix was not confidently visualized; however there is no acute inflammatory process in the right lower  quadrant. Vascular/Lymphatic: No ascites or pneumoperitoneum. No abdominal or pelvic lymphadenopathy, by size criteria. No aneurysmal dilation of the major abdominal arteries. There are mild peripheral atherosclerotic vascular calcifications of the aorta and its major branches. Reproductive: Normal-size anteverted uterus. Redemonstration of thickened endometrium measuring up to 9 mm, without discrete focal mass. No large adnexal mass. Bilateral fallopian tube closure devices noted. Other: There is a tiny fat containing umbilical hernia. The soft tissues and abdominal wall are otherwise unremarkable. Musculoskeletal: No suspicious osseous lesions. There are mild multilevel degenerative changes in the visualized spine. Review of the MIP images confirms the above findings. IMPRESSION: 1. No evidence of pulmonary embolism to the proximal subsegmental pulmonary artery level. 2. Interval increase in size of left lung lower lobe mass with resultant postobstructive pneumonia versus atelectasis of the left lung lower lobe. Increasing right pleural effusion. 3. Stable right  adrenal mass. 4. Stable thickened endometrium.  No focal mass. 5. Multiple other nonacute observations, as described above. Electronically Signed   By: Jules Schick M.D.   On: 07/06/2023 17:15   DG Chest Portable 1 View  Result Date: 07/06/2023 CLINICAL DATA:  Cough and swelling EXAM: PORTABLE CHEST 1 VIEW COMPARISON:  X-ray 06/24/2023 FINDINGS: Left upper chest port. Tip along the central SVC. Stable cardiopericardial silhouette. No pneumothorax or effusion. Developing left retrocardiac opacity. New infiltrates possible. Stable left perihilar mass and possible additional nodular area in the medial right lung base as on prior. Please correlate with prior workup. Overlapping cardiac leads. Fixation hardware along the lower cervical spine at the edge of the imaging field. IMPRESSION: Developing left retrocardiac opacity.  Possible new infiltrate. Stable  left perihilar lung mass with chest port Electronically Signed   By: Karen Kays M.D.   On: 07/06/2023 16:38   DG Chest Port 1 View  Result Date: 06/24/2023 CLINICAL DATA:  Port-A-Cath placement EXAM: PORTABLE CHEST 1 VIEW COMPARISON:  06/14/2023 FINDINGS: Left Port-A-Cath tip at low SVC 2 superior caval/atrial junction. Right hemidiaphragm elevation.  Cervical spine fixation. Patient rotated left. Midline trachea. Normal heart size. No pleural effusion or pneumothorax. Left perihilar mass measures 6.1 cm today versus 5.5 cm on 06/14/2023. IMPRESSION: Left Port-A-Cath terminates at the low SVC or superior caval/atrial junction, without pneumothorax. Left perihilar mass measures slightly larger today than on 06/04/2023. Difference is favored to be due to mild obliquity. Electronically Signed   By: Jeronimo Greaves M.D.   On: 06/24/2023 19:10   DG C-Arm 1-60 Min-No Report  Result Date: 06/24/2023 Fluoroscopy was utilized by the requesting physician.  No radiographic interpretation.   DG Abd 1 View  Result Date: 06/21/2023 CLINICAL DATA:  Small bowel obstruction. EXAM: ABDOMEN - 1 VIEW COMPARISON:  June 20, 2023. FINDINGS: No abnormal bowel dilatation is noted. Surgical clips are noted in the pelvis. IMPRESSION: No abnormal bowel dilatation. Electronically Signed   By: Lupita Raider M.D.   On: 06/21/2023 09:53   CT ABDOMEN PELVIS W CONTRAST  Result Date: 06/20/2023 CLINICAL DATA:  Abdominal pain for several weeks. History of lung cancer. * Tracking Code: BO * EXAM: CT ABDOMEN AND PELVIS WITH CONTRAST TECHNIQUE: Multidetector CT imaging of the abdomen and pelvis was performed using the standard protocol following bolus administration of intravenous contrast. RADIATION DOSE REDUCTION: This exam was performed according to the departmental dose-optimization program which includes automated exposure control, adjustment of the mA and/or kV according to patient size and/or use of iterative reconstruction  technique. CONTRAST:  OMNIPAQUE IOHEXOL 300 MG/ML  SOLN COMPARISON:  06/11/2023 FINDINGS: Lower chest: Rapidly enlarging mass within the left hilar region is identified. This now measures at least 5.9 x 5.0 cm, image 9/4. On the PET-CT from 05/27/23 this measured 5.7 x 3.5 cm. New small right pleural effusion. Hepatobiliary: There is no suspicious liver abnormality. The gallbladder appears normal. No bile duct dilatation. Pancreas: Unremarkable. Spleen: No focal splenic abnormality. Spleen measures 12.6 cm cranial caudal Adrenals/Urinary Tract: Large solid and cystic mass centered around the right adrenal gland measures 10.3 by 7.9 by 13.7, image 22/2. On 06/11/2023 10.0 by 7.4 by 13.1 cm. Normal left adrenal gland. The kidneys appear unremarkable. No signs of nephrolithiasis, hydronephrosis or mass. Urinary bladder appears normal. Stomach/Bowel: Stomach appears within normal limits. Progressive distension of the small bowel measure up to 2.9 cm with a few air-fluid levels. The distal small bowel loops have a normal caliber  up to the ileocecal valve. No bowel wall thickening or inflammation. Normal caliber of the colon. Vascular/Lymphatic: Aortic atherosclerosis. Aortocaval lymph node measures 1 cm, image 33/2. No pelvic or inguinal adenopathy. Reproductive: Small fibroid arises off the subserosal right uterine corpus measuring 2.1 cm, image 79/2. The endometrial stripe measures 9 mm in thickness which is considered abnormal in a postmenopausal female. No adnexal mass. Other: No significant free fluid or fluid collections. Musculoskeletal: No acute or significant osseous findings. IMPRESSION: 1. Progressive distension of the small bowel measure up to 2.9 cm with a few air-fluid levels. The distal small bowel loops have a normal caliber up to the ileocecal valve. Although nonspecific these findings may reflect developing small bowel ileus or early small-bowel obstruction. Consider serial follow-up radiographs  of the abdomen. 2. Enlarging mass within the left hilar region is identified. The visualized portions in the left lower lobe measures 5.9 x 5.0 cm. On the PET-CT from 05/27/23 the perihilar portion measured up to 5.7 x 3.5 cm. 3. 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 On 06/11/2023 10.0 by 7.4 by 13.1 cm. 4. New small right pleural effusion. 5. Endometrial stripe measures 9 mm in thickness which is considered abnormal in a postmenopausal female. Consider further evaluation with pelvic ultrasound. 6.  Aortic Atherosclerosis (ICD10-I70.0). Electronically Signed   By: Signa Kell M.D.   On: 06/20/2023 17:50   MR Brain W Wo Contrast  Result Date: 06/16/2023 CLINICAL DATA:  Provided history: Malignant neoplasm of unspecified part of unspecified bronchus or lung. Non-small cell lung cancer, staging. EXAM: MRI HEAD WITHOUT AND WITH CONTRAST TECHNIQUE: Multiplanar, multiecho pulse sequences of the brain and surrounding structures were obtained without and with intravenous contrast. CONTRAST:  10mL GADAVIST GADOBUTROL 1 MMOL/ML IV SOLN COMPARISON:  Head CT 04/09/2016. FINDINGS: Brain: Cerebral volume is normal. 2 mm focus of enhancement along the right central sulcus which is indeterminate for a small cortical metastasis versus vascular enhancement (series 16, image 34). Small T2 hyperintense foci within the left thalamus and left lentiform nucleus, which may reflect prominent perivascular spaces or chronic lacunar infarcts. There are a few small foci of T2 FLAIR hyperintense signal abnormality within the cerebral white matter, nonspecific but compatible with minimal changes of chronic small vessel ischemia. There is no acute infarct. No chronic intracranial blood products. No extra-axial fluid collection. No midline shift. Vascular: Maintained flow voids within the proximal large arterial vessels. Small developmental venous anomaly within the anterior right frontal lobe  (anatomic variant). Skull and upper cervical spine: No focal suspicious marrow lesion. Susceptibility artifact arising from ACDF hardware. Sinuses/Orbits: No mass or acute finding within the imaged orbits. No significant paranasal sinus disease. Impression #1 will be called to the ordering clinician or representative by the Radiologist Assistant, and communication documented in the PACS or [employer]. IMPRESSION: 1. 2 mm focus of enhancement along the right central sulcus, which is indeterminate for a small cortical metastasis versus vascular enhancement. A short-interval follow-up brain MRI (with and without contrast) is recommended in 6-8 weeks. 2. Small T2 hyperintense foci within the deep gray nuclei on the left, which may reflect prominent perivascular spaces or chronic lacunar infarcts. 3. Minimal chronic small vessel ischemic changes within the cerebral white matter. Electronically Signed   By: Jackey Loge D.O.   On: 06/16/2023 17:40   DG Chest Port 1 View  Result Date: 06/14/2023 CLINICAL DATA:  Status post bronchoscopy and biopsy EXAM: PORTABLE CHEST 1 VIEW  COMPARISON:  05/07/2023 FINDINGS: Normal heart size. Left hilar mass. No acute abnormality of the lungs following bronchoscopy. The visualized skeletal structures are unremarkable. IMPRESSION: No acute abnormality of the lungs status post bronchoscopy. Left hilar mass. Electronically Signed   By: Jearld Lesch M.D.   On: 06/14/2023 13:11   DG C-ARM BRONCHOSCOPY  Result Date: 06/14/2023 C-ARM BRONCHOSCOPY: Fluoroscopy was utilized by the requesting physician.  No radiographic interpretation.   CT ABDOMEN PELVIS W CONTRAST  Result Date: 06/11/2023 CLINICAL DATA:  62 year old female with history of abdominal pain. Nausea and vomiting for the past 6 weeks. Additional history of lung cancer. * Tracking Code: BO * EXAM: CT ABDOMEN AND PELVIS WITH CONTRAST TECHNIQUE: Multidetector CT imaging of the abdomen and pelvis was performed using the  standard protocol following bolus administration of intravenous contrast. RADIATION DOSE REDUCTION: This exam was performed according to the departmental dose-optimization program which includes automated exposure control, adjustment of the mA and/or kV according to patient size and/or use of iterative reconstruction technique. CONTRAST:  75mL OMNIPAQUE IOHEXOL 350 MG/ML SOLN COMPARISON:  PET-CT 05/27/2023. CT of the abdomen and pelvis 05/20/2023. FINDINGS: Lower chest: Trace right pleural effusion lying dependently. Hepatobiliary: No definite suspicious cystic or solid hepatic lesions. No intra or extrahepatic biliary ductal dilatation. Gallbladder is unremarkable in appearance. Pancreas: No pancreatic mass. No pancreatic ductal dilatation. No pancreatic or peripancreatic fluid collections or inflammatory changes. Spleen: Unremarkable. Adrenals/Urinary Tract: Large heterogeneous appearing mixed cystic and solid right adrenal mass (axial image 25 of series 3 and coronal image 64 of series 6) which currently measures 10.6 x 7.2 x 14.6 cm, which exerts mass effect upon adjacent structures displacing the right lobe of the liver anteriorly and superiorly, and making broad contact with the upper pole of the right kidney which is mildly distorted by the adrenal mass. Left kidney and left adrenal gland are normal in appearance. No hydroureteronephrosis. Urinary bladder is unremarkable in appearance. Stomach/Bowel: The appearance of the stomach is normal. There is no pathologic dilatation of small bowel or colon. Numerous colonic diverticuli are noted, without surrounding inflammatory changes to suggest an acute diverticulitis at this time. The appendix is not confidently identified and may be surgically absent. Regardless, there are no inflammatory changes noted adjacent to the cecum to suggest the presence of an acute appendicitis at this time. Vascular/Lymphatic: Atherosclerosis in the abdominal aorta and pelvic  vasculature. No lymphadenopathy noted in the abdomen or pelvis. Reproductive: Tubal ligation clips are noted bilaterally. Uterus and ovaries are otherwise unremarkable in appearance. Other: No significant volume of ascites.  No pneumoperitoneum. Musculoskeletal: There are no aggressive appearing lytic or blastic lesions noted in the visualized portions of the skeleton. IMPRESSION: 1. No definite acute findings are noted in the abdomen or pelvis. 2. Large right adrenal metastasis redemonstrated, slightly increased in size compared to prior studies, most compatible with a metastatic lesion. 3. Trace right pleural effusion lying dependently. 4. Aortic atherosclerosis. 5. Additional incidental findings, as above. Electronically Signed   By: Trudie Reed M.D.   On: 06/11/2023 05:20    Catarina Hartshorn, DO  [facility]  If 7PM-7AM, please contact night-coverage [email or web address] Password [email or web address] 07/09/2023, 3:54 PM   LOS: 3 days

## 2023-07-09 NOTE — Progress Notes (Signed)
Patient pain controlled well this shift with PO percocet and oxycodone. No c/o nausea this shift. Appetite fair.

## 2023-07-10 DIAGNOSIS — C3492 Malignant neoplasm of unspecified part of left bronchus or lung: Secondary | ICD-10-CM | POA: Diagnosis not present

## 2023-07-10 DIAGNOSIS — R6520 Severe sepsis without septic shock: Secondary | ICD-10-CM | POA: Diagnosis not present

## 2023-07-10 DIAGNOSIS — A419 Sepsis, unspecified organism: Secondary | ICD-10-CM | POA: Diagnosis not present

## 2023-07-10 DIAGNOSIS — J189 Pneumonia, unspecified organism: Secondary | ICD-10-CM | POA: Diagnosis not present

## 2023-07-10 LAB — CBC
HCT: 31.6 % — ABNORMAL LOW (ref 36.0–46.0)
Hemoglobin: 9.6 g/dL — ABNORMAL LOW (ref 12.0–15.0)
MCH: 25.2 pg — ABNORMAL LOW (ref 26.0–34.0)
MCHC: 30.4 g/dL (ref 30.0–36.0)
MCV: 82.9 fL (ref 80.0–100.0)
Platelets: 357 10*3/uL (ref 150–400)
RBC: 3.81 MIL/uL — ABNORMAL LOW (ref 3.87–5.11)
RDW: 16.8 % — ABNORMAL HIGH (ref 11.5–15.5)
WBC: 10.8 10*3/uL — ABNORMAL HIGH (ref 4.0–10.5)
nRBC: 0 % (ref 0.0–0.2)

## 2023-07-10 LAB — PROCALCITONIN: Procalcitonin: 3.74 ng/mL

## 2023-07-10 LAB — BASIC METABOLIC PANEL
Anion gap: 8 (ref 5–15)
BUN: 8 mg/dL (ref 8–23)
CO2: 25 mmol/L (ref 22–32)
Calcium: 8 mg/dL — ABNORMAL LOW (ref 8.9–10.3)
Chloride: 100 mmol/L (ref 98–111)
Creatinine, Ser: 0.49 mg/dL (ref 0.44–1.00)
GFR, Estimated: >60 mL/min (ref >60)
Glucose, Bld: 93 mg/dL (ref 70–99)
Potassium: 3.7 mmol/L (ref 3.5–5.1)
Sodium: 133 mmol/L — ABNORMAL LOW (ref 135–145)

## 2023-07-10 LAB — MAGNESIUM: Magnesium: 2 mg/dL (ref 1.7–2.4)

## 2023-07-10 MED ORDER — ALBUMIN HUMAN 25 % IV SOLN
12.5000 g | Freq: Once | INTRAVENOUS | Status: AC
  Administered 2023-07-10: 12.5 g via INTRAVENOUS
  Filled 2023-07-10: qty 50

## 2023-07-10 MED ORDER — ORAL CARE MOUTH RINSE: 15.0000 mL | OROMUCOSAL | Status: DC | PRN

## 2023-07-10 MED ORDER — SODIUM CHLORIDE 0.9 % IV BOLUS
500.0000 mL | Freq: Once | INTRAVENOUS | Status: AC
  Administered 2023-07-10: 500 mL via INTRAVENOUS

## 2023-07-10 MED ORDER — OXYCODONE HCL 5 MG PO TABS
5.0000 mg | ORAL_TABLET | ORAL | Status: DC | PRN
  Administered 2023-07-10 – 2023-07-11 (×4): 5 mg via ORAL
  Filled 2023-07-10 (×4): qty 1

## 2023-07-10 MED ORDER — POTASSIUM CHLORIDE CRYS ER 20 MEQ PO TBCR
20.0000 meq | EXTENDED_RELEASE_TABLET | Freq: Once | ORAL | Status: AC
  Administered 2023-07-10: 20 meq via ORAL
  Filled 2023-07-10: qty 1

## 2023-07-10 MED ORDER — METOPROLOL TARTRATE 25 MG PO TABS
12.5000 mg | ORAL_TABLET | Freq: Two times a day (BID) | ORAL | Status: DC
  Administered 2023-07-10 – 2023-07-11 (×2): 12.5 mg via ORAL
  Filled 2023-07-10 (×2): qty 1

## 2023-07-10 NOTE — Plan of Care (Signed)
  Problem: Acute Rehab PT Goals(only PT should resolve) Goal: Pt Will Go Supine/Side To Sit Outcome: Progressing Flowsheets (Taken 07/10/2023 1117) Pt will go Supine/Side to Sit:  with supervision  with contact guard assist Goal: Patient Will Transfer Sit To/From Stand Outcome: Progressing Flowsheets (Taken 07/10/2023 1117) Patient will transfer sit to/from stand:  with contact guard assist  with minimal assist Goal: Pt Will Transfer Bed To Chair/Chair To Bed Outcome: Progressing Flowsheets (Taken 07/10/2023 1117) Pt will Transfer Bed to Chair/Chair to Bed:  with supervision  with contact guard assist Goal: Pt Will Ambulate Outcome: Progressing Flowsheets (Taken 07/10/2023 1117) Pt will Ambulate:  50 feet  with supervision  with contact guard assist  with rolling walker   11:18 AM, 07/10/23 Ocie Bob, MPT Physical Therapist with [facility] [phone] office [phone] mobile phone

## 2023-07-10 NOTE — Evaluation (Signed)
Physical Therapy Evaluation Patient Details Name: Miranda Perkins MRN: [id number] DOB: Oct 27, 1961 Today's Date: 07/10/2023  History of Present Illness  Miranda Perkins is a 62 y.o. female with medical history significant for lung cancer, CVA, adrenal hemorrhage.  Patient presented to the ED with complaints of increasing difficulty breathing, with bilateral lower extremity swelling.  Reports symptoms started a few days after discharge from hospital.     Patient was recently hospitalized 8/18 to 8/24 for persistent diarrhea from C. difficile infection after she had completed 10 days of Dificid.  On discharge symptoms solved, and she was discharged home to complete 10 days of oral vancomycin.     She reports lower extremity swelling on discharge, but since getting home this has significantly worsened, her dose of Lasix was increased from 20 back to 40 mg daily which she has been compliant with but swelling has not improved. She reports weight of 220lbs on getting on from the hospital, has not checked since.  No chest pain.   Clinical Impression  Patient required increased time and Min assist to sitting up at bedside with [facility] partially raised, had difficulty flexing knees due to swelling resulting in difficulty completing sit to stands, once on feet able to ambulate in room without loss of balance, but limited mostly due to fatigue and BLE discomfort.  Patient tolerated sitting up in chair after therapy with her spouse present in room.  Patient will benefit from continued skilled physical therapy in hospital and recommended venue below to increase strength, balance, endurance for safe ADLs and gait.          If plan is discharge home, recommend the following: A little help with walking and/or transfers;A little help with bathing/dressing/bathroom;Help with stairs or ramp for entrance;Assistance with cooking/housework   Can travel by private vehicle        Equipment Recommendations None  recommended by PT  Recommendations for Other Services       Functional Status Assessment Patient has had a recent decline in their functional status and demonstrates the ability to make significant improvements in function in a reasonable and predictable amount of time.     Precautions / Restrictions Precautions Precautions: Fall Restrictions Weight Bearing Restrictions: No      Mobility  Bed Mobility Overal bed mobility: Needs Assistance Bed Mobility: Supine to Sit     Supine to sit: Min assist, HOB elevated     General bed mobility comments: increased time, labored movement, HOB partially raised    Transfers Overall transfer level: Needs assistance Equipment used: Rolling walker (2 wheels) Transfers: Sit to/from Stand, Bed to chair/wheelchair/BSC Sit to Stand: Mod assist   Step pivot transfers: Min assist       General transfer comment: most difficulty completing sit to stands due to BLE weakness    Ambulation/Gait Ambulation/Gait assistance: Min assist, Mod assist Gait Distance (Feet): 20 Feet Assistive device: Rolling walker (2 wheels) Gait Pattern/deviations: Decreased step length - right, Decreased step length - left, Decreased stride length Gait velocity: decreased     General Gait Details: slow labored slightly unsteady cadence with short step/stride length, limited mostly due to c/o fatigue and tightness in legs  Stairs            Wheelchair Mobility     Tilt Bed    Modified Rankin (Stroke Patients Only)       Balance Overall balance assessment: Needs assistance Sitting-balance support: Feet supported, No upper extremity supported Sitting balance-Leahy Scale: Good Sitting  balance - Comments: seated at EOB   Standing balance support: Reliant on assistive device for balance, During functional activity, Bilateral upper extremity supported Standing balance-Leahy Scale: Fair Standing balance comment: using RW                              Pertinent Vitals/Pain Pain Assessment Pain Assessment: 0-10 Pain Score: 4  Pain Location: low back and BLE Pain Descriptors / Indicators: Sore, Grimacing, Guarding, Discomfort Pain Intervention(s): Limited activity within patient's tolerance, Monitored during session, Repositioned    Home Living Family/patient expects to be discharged to:: Private residence Living Arrangements: Spouse/significant other Available Help at Discharge: Family;Available 24 hours/day Type of Home: House Home Access: Stairs to enter Entrance Stairs-Rails: Can reach both Entrance Stairs-Number of Steps: 5   Home Layout: One level Home Equipment: Educational psychologist (2 wheels);BSC/3in1;Shower seat - built in      Prior Function Prior Level of Function : Needs assist       Physical Assist : Mobility (physical);ADLs (physical) Mobility (physical): Bed mobility;Transfers;Gait;Stairs   Mobility Comments: household ambulation using RW ADLs Comments: Assisted by family     Extremity/Trunk Assessment   Upper Extremity Assessment Upper Extremity Assessment: Generalized weakness    Lower Extremity Assessment Lower Extremity Assessment: Generalized weakness    Cervical / Trunk Assessment Cervical / Trunk Assessment: Normal  Communication   Communication Communication: No apparent difficulties  Cognition Arousal: Alert Behavior During Therapy: WFL for tasks assessed/performed Overall Cognitive Status: Within Functional Limits for tasks assessed                                          General Comments      Exercises     Assessment/Plan    PT Assessment Patient needs continued PT services  PT Problem List Decreased strength;Decreased activity tolerance;Decreased balance;Decreased mobility       PT Treatment Interventions DME instruction;Gait training;Stair training;Functional mobility training;Therapeutic activities;Therapeutic exercise;Balance  training;Patient/family education    PT Goals (Current goals can be found in the Care Plan section)  Acute Rehab PT Goals Patient Stated Goal: return home with family to assist PT Goal Formulation: With patient/family Time For Goal Achievement: 07/13/23 Potential to Achieve Goals: Good    Frequency Min 3X/week     Co-evaluation               AM-PAC PT "6 Clicks" Mobility  Outcome Measure Help needed turning from your back to your side while in a flat bed without using bedrails?: A Little Help needed moving from lying on your back to sitting on the side of a flat bed without using bedrails?: A Little Help needed moving to and from a bed to a chair (including a wheelchair)?: A Little Help needed standing up from a chair using your arms (e.g., wheelchair or bedside chair)?: A Lot Help needed to walk in hospital room?: A Little Help needed climbing 3-5 steps with a railing? : A Lot 6 Click Score: 16    End of Session   Activity Tolerance: Patient tolerated treatment well;Patient limited by fatigue Patient left: in chair;with call bell/phone within reach;with family/visitor present Nurse Communication: Mobility status PT Visit Diagnosis: Unsteadiness on feet (R26.81);Other abnormalities of gait and mobility (R26.89);Muscle weakness (generalized) (M62.81)    Time: 1610-9604 PT Time Calculation (min) (ACUTE ONLY): 21 min  Charges:   PT Evaluation $PT Eval Moderate Complexity: 1 Mod PT Treatments $Therapeutic Activity: 8-22 mins PT General Charges $$ ACUTE PT VISIT: 1 Visit         11:15 AM, 07/10/23 Ocie Bob, MPT Physical Therapist with [facility] [phone] office [phone] mobile phone

## 2023-07-10 NOTE — Progress Notes (Addendum)
PROGRESS NOTE  SOLEI HEMMERLING WGN:[id number] DOB: 1961/06/16 DOA: 07/06/2023 PCP: [name], DO  Brief History:   62 y.o. female with medical history significant for metastatic lung adenocarcinom , CVA, adrenal hemorrhage, and c diff colitis presents with 4 days of sob and cough.  The patient had some fevers and chills.  She stated her temperature was up to 99.9 F.  She has had some posttussive emesis at home.  In addition, she has been complaining of increasing shortness of breath over the past 4 days with lower extremity edema.  She has had a small amount of blood-tinged sputum.  Patient was recently hospitalized 8/18 to 8/24 for persistent diarrhea from C. difficile infection after she had completed 10 days of Dificid.  On discharge symptoms solved, and she was discharged home to complete 10 days of oral vancomycin   She continues to have some vaginal bleeding.  She states that her dose of furosemide was recently increased to 40 mg daily.  She continues to have lower extremity edema.  ED Course: Tmax of 102.2, initial tachycardia to 135 improved after fluids now low 110s.  Respirate rate 18-22.  Blood pressure systolic 98-115.  O2 sats 93 to 95% on room air.  WBC 12.  Lactic acid 2.2 > 1.4.  COVID test negative. CTA chest negative for PE,  Interval increase in size of left lung lower lobe mass with resultant postobstructive pneumonia versus atelectasis of the left lung lower lobe. Increasing right pleural effusion. Vancomycin, aztreonam and metronidazole started. MRSA was negative.  Vanco was discontinued and she was transitioned to cefepime and metronidazole which she tolerated. Overall, the patient improved clinically with improving respiratory status.  She remained stable on room air.  She stated that her dyspnea gradually improved.  The patient also had nausea and vomiting.  She was started on antiemetics.  Gradually this improved and she was able to tolerate a diet.   Medical oncology was consulted.  Dr. Ellin Saba saw the patient.  He recommended continuing to optimize the patient medically.  She will follow-up with him in the office to start immunotherapy the week following her discharge.   Assessment/Plan: Lobar pneumonia/postobstructive pneumonia -07/06/2023 CTA chest negative for PE; interval increased in LLL mass with resultant postobstructive PNA versus atelectasis -initially started vancomycin, aztreonam, metronidazole -CT abd/ pelvis-Stable right adrenal mass; Stable thickening of the endometrium with no focal mass -Check PCT 4/81>>4.44>>3.74 -d/c vancomycin (MRSA neg) -tolerating cefepime and metronidazole -remains stable on RA -plan to d/c home with cefadroxil x 5 more days   Severe sepsis Presented with fever, tachycardia, leukocytosis -Lactic acid 2.2>> 1.4 -Follow blood cultures--neg to date -continue cefepime and metronidazole -overall sepsis physiology improved   Recent history of C. difficile colitis -Restarted oral vancomycin while on antibiotics for pneumonia   Lower extremity edema -Likely secondary to low albumin -Albumin has dropped from 4.6-2.3 in the last 2 months -Venous duplex--neg for DVT -Hold furosemide temporarily -9/5 echo EF >75%, no WMA, normal RVF; IVC small suggesting hypovolemia   Hypokalemia -Repleting -Magnesium 1.9   Metastatic primary lung cancer, left (HCC) Follows with Dr. 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  Just completed 5 treatments of radiation therapy on adrenal mass on 07/02/23.  --appreciate Dr. Ellin Saba consult>>treat PNA and plan to start immunotherapy next week;  hold off additional XRT for now -discussed with Dr.  Katragadda   Class 2 Obesity -lifestyle modfication -BMI 38.07  Sinus tachycardia -CTA chest--neg PE -multifactorial including medical stress and  intravascular volume depletion -Echo EF >75%, no WMA -start low dose metoprolol -fluid boluses               Family Communication:  spouse updated 9/7   Consultants:  none   Code Status:  FULL    DVT Prophylaxis:  [place] Lovenox     Procedures: As Listed in Progress Note Above   Antibiotics: Vanc IV 9/3>>9/4 Aztreonam 9/3>>9/4 Cefepime 9/4>> Metronidazole 9/3>>        Subjective: Overall, patient states that her nausea and vomiting have improved.  Her hypersalivation has improved.  She denies any chest pain, worsening shortness of breath.  She states that her breathing has gradually improved.  She denies any chest pain, abdominal pain, hematochezia, melena.  Objective: Vitals:   07/09/23 2354 07/10/23 0535 07/10/23 0734 07/10/23 1414  BP: 108/71 120/72 125/68 122/74  Pulse:  100 (!) 101 (!) 111  Resp:      Temp: 99.1 F (37.3 C) 98.1 F (36.7 C) 98.3 F (36.8 C) 98.4 F (36.9 C)  TempSrc: Oral Oral Oral Oral  SpO2: 97% 96% 98% 98%  Weight:  110 kg    Height:        Intake/Output Summary (Last 24 hours) at 07/10/2023 1625 Last data filed at 07/10/2023 1300 Gross per 24 hour  Intake 1771.86 ml  Output --  Net 1771.86 ml   Weight change: 2.952 kg Exam:  General:  Pt is alert, follows commands appropriately, not in acute distress HEENT: No icterus, No thrush, No neck mass, [place]/AT Cardiovascular: RRR, S1/S2, no rubs, no gallops Respiratory: Bibasilar crackles.  No wheezing.  Good air movement Abdomen: Soft/+BS, non tender, non distended, no guarding Extremities: 2 + LE edema, No lymphangitis, No petechiae, No rashes, no synovitis   Data Reviewed: I have personally reviewed following labs and imaging studies Basic Metabolic Panel: Recent Labs  Lab 07/06/23 1305 07/07/23 0436 07/08/23 0430 07/09/23 0432 07/10/23 0417  NA 133* 133* 135 134* 133*  K 3.5 3.1* 3.5 3.4* 3.7  CL 97* 98 101 101 100  CO2 24 25 24 23 25   GLUCOSE 166* 104* 97 96 93  BUN  14 11 10 8 8   CREATININE 0.75 0.59 0.52 0.51 0.49  CALCIUM 8.2* 7.9* 8.0* 8.0* 8.0*  MG 2.0  --   --  1.9 2.0   Liver Function Tests: Recent Labs  Lab 07/06/23 1305 07/08/23 0430  AST 66* 15  ALT 53* 26  ALKPHOS 168* 121  BILITOT 0.8 0.6  PROT 6.3* 5.1*  ALBUMIN 2.3* 1.9*   No results for input(s): "LIPASE", "AMYLASE" in the last 168 hours. No results for input(s): "AMMONIA" in the last 168 hours. Coagulation Profile: Recent Labs  Lab 07/06/23 1443  INR 1.3*   CBC: Recent Labs  Lab 07/06/23 1305 07/07/23 0436 07/08/23 0430 07/09/23 0432 07/10/23 0417  WBC 12.2* 9.9 9.3 10.0 10.8*  NEUTROABS 10.9*  --   --   --   --   HGB 11.7* 9.6* 9.3* 9.3* 9.6*  HCT 37.7 30.8* 30.7* 30.9* 31.6*  MCV 81.1 81.7 83.2 83.3 82.9  PLT 527* 416* 411* 390 357   Cardiac Enzymes: No results for input(s): "CKTOTAL", "CKMB", "CKMBINDEX", "TROPONINI" in the last 168 hours. BNP: Invalid input(s): "POCBNP" CBG: No results for input(s): "GLUCAP" in the last 168 hours. HbA1C: No results for input(s): "HGBA1C" in  the last 72 hours. Urine analysis:    Component Value Date/Time   COLORURINE YELLOW 06/11/2023 0526   APPEARANCEUR CLEAR 06/11/2023 0526   APPEARANCEUR Clear 04/06/2023 1106   LABSPEC >1.046 (H) 06/11/2023 0526   PHURINE 7.0 06/11/2023 0526   GLUCOSEU NEGATIVE 06/11/2023 0526   HGBUR NEGATIVE 06/11/2023 0526   BILIRUBINUR NEGATIVE 06/11/2023 0526   BILIRUBINUR Negative 04/06/2023 1106   KETONESUR 20 (A) 06/11/2023 0526   PROTEINUR NEGATIVE 06/11/2023 0526   NITRITE NEGATIVE 06/11/2023 0526   LEUKOCYTESUR NEGATIVE 06/11/2023 0526   Sepsis Labs: @LABRCNTIP (procalcitonin:4,lacticidven:4) ) Recent Results (from the past 240 hour(s))  SARS Coronavirus 2 by RT PCR (hospital order, performed in [facility] hospital lab) *cepheid single result test* Anterior Nasal Swab     Status: None   Collection Time: 07/06/23  2:20 PM   Specimen: Anterior Nasal Swab  Result Value Ref Range  Status   SARS Coronavirus 2 by RT PCR NEGATIVE NEGATIVE Final    Comment: (NOTE) SARS-CoV-2 target nucleic acids are NOT DETECTED.  The SARS-CoV-2 RNA is generally detectable in upper and lower respiratory specimens during the acute phase of infection. The lowest concentration of SARS-CoV-2 viral copies this assay can detect is 250 copies / mL. A negative result does not preclude SARS-CoV-2 infection and should not be used as the sole basis for treatment or other patient management decisions.  A negative result may occur with improper specimen collection / handling, submission of specimen other than nasopharyngeal swab, presence of viral mutation(s) within the areas targeted by this assay, and inadequate number of viral copies (<250 copies / mL). A negative result must be combined with clinical observations, patient history, and epidemiological information.  Fact Sheet for Patients:   RoadLapTop.co.za  Fact Sheet for Healthcare Providers: http://kim-miller.com/  This test is not yet approved or  cleared by the [place] FDA and has been authorized for detection and/or diagnosis of SARS-CoV-2 by FDA under an Emergency Use Authorization (EUA).  This EUA will remain in effect (meaning this test can be used) for the duration of the COVID-19 declaration under Section 564(b)(1) of the Act, 21 U.S.C. section 360bbb-3(b)(1), unless the authorization is terminated or revoked sooner.  Performed at [facility], [address] [place], [place] [address]   Blood Culture (routine x 2)     Status: None (Preliminary result)   Collection Time: 07/06/23  2:43 PM   Specimen: BLOOD  Result Value Ref Range Status   Specimen Description BLOOD BLOOD RIGHT ARM  Final   Special Requests   Final    BOTTLES DRAWN AEROBIC AND ANAEROBIC Blood Culture adequate volume   Culture   Final    NO GROWTH 4 DAYS Performed at [facility], [address]  [place], [place] [address]    Report Status PENDING  Incomplete  Blood Culture (routine x 2)     Status: None (Preliminary result)   Collection Time: 07/06/23  2:43 PM   Specimen: BLOOD  Result Value Ref Range Status   Specimen Description BLOOD BLOOD LEFT ARM  Final   Special Requests   Final    BOTTLES DRAWN AEROBIC AND ANAEROBIC Blood Culture adequate volume   Culture   Final    NO GROWTH 4 DAYS Performed at [facility], [address], [place], [place] [address]    Report Status PENDING  Incomplete  MRSA Next Gen by PCR, Nasal     Status: None   Collection Time: 07/07/23  8:58 AM   Specimen:  Nasal Mucosa; Nasal Swab  Result Value Ref Range Status   MRSA by PCR Next Gen NOT DETECTED NOT DETECTED Final    Comment: (NOTE) The GeneXpert MRSA Assay (FDA approved for NASAL specimens only), is one component of a comprehensive MRSA colonization surveillance program. It is not intended to diagnose MRSA infection nor to guide or monitor treatment for MRSA infections. Test performance is not FDA approved in patients less than 78 years old. Performed at [facility], [address] [place], [place] [address]      Scheduled Meds:  Chlorhexidine Gluconate Cloth  6 each Topical Daily   enoxaparin (LOVENOX) injection  50 mg Subcutaneous Q24H   folic acid  1 mg Oral Daily   melatonin  6 mg Oral QHS   metoprolol tartrate  12.5 mg Oral BID   progesterone  200 mg Oral QHS   saccharomyces boulardii  250 mg Oral BID   vancomycin  125 mg Oral QID   Continuous Infusions:  ceFEPime (MAXIPIME) IV 2 g (07/10/23 1402)   metronidazole 500 mg (07/10/23 1444)    Procedures/Studies: ECHOCARDIOGRAM COMPLETE  Result Date: 07/08/2023    ECHOCARDIOGRAM REPORT   Patient Name:   TAYJA HEDINGER Decker Date of Exam: 07/08/2023 Medical Rec #:  [id number]          Height:       66.0 in Accession #:    [id number]         Weight:       237.4 lb Date of Birth:  02-13-1961          BSA:          2.151 m Patient Age:    62  years           BP:           11/71 mmHg Patient Gender: F                  HR:           103 bpm. [facility] Procedure: 2D Echo, Cardiac Doppler and Color Doppler Indications:    Swelling of lower extremity [[id number]]  History:        Patient has prior history of Echocardiogram examinations, most                 recent 01/24/2021. Lung Cancer and Stroke; Signs/Symptoms:Edema.                 Sepsis.  Sonographer:    Aron Baba Referring Phys: [id number] Onnie Boer  Sonographer Comments: Patient is obese. Image acquisition challenging due to respiratory motion. IMPRESSIONS  1. Left ventricular ejection fraction, by estimation, is >75%. The left ventricle has hyperdynamic function. The left ventricle has no regional wall motion abnormalities. Left ventricular diastolic parameters are indeterminate.  2. Right ventricular systolic function is normal. The right ventricular size is mildly enlarged. There is mildly elevated pulmonary artery systolic pressure.  3. The mitral valve is normal in structure. No evidence of mitral valve regurgitation. No evidence of mitral stenosis.  4. The tricuspid valve is abnormal.  5. The aortic valve is tricuspid. Aortic valve regurgitation is not visualized. No aortic stenosis is present.  6. IVC is small suggesting low RA pressure and hypovolemia. FINDINGS  Left Ventricle: Hyperdynamic LV function creates a mild dynamic LVOT gradient, peak gradient 15 mmHg. Left ventricular ejection fraction, by estimation, is >75%. The left ventricle has hyperdynamic function. The left ventricle has no  regional wall motion abnormalities. The left ventricular internal cavity size was normal in size. There is no left ventricular hypertrophy. Left ventricular diastolic parameters are indeterminate. Right Ventricle: The right ventricular size is mildly enlarged. Right vetricular wall thickness was not well visualized. Right ventricular systolic function is normal. There is mildly  elevated pulmonary artery systolic pressure. The tricuspid regurgitant  velocity is 2.87 m/s, and with an assumed right atrial pressure of 8 mmHg, the estimated right ventricular systolic pressure is 40.9 mmHg. Left Atrium: Left atrial size was normal in size. Right Atrium: Right atrial size was normal in size. Pericardium: There is no evidence of pericardial effusion. Mitral Valve: The mitral valve is normal in structure. No evidence of mitral valve regurgitation. No evidence of mitral valve stenosis. Tricuspid Valve: The tricuspid valve is abnormal. Tricuspid valve regurgitation is mild . No evidence of tricuspid stenosis. Aortic Valve: The aortic valve is tricuspid. Aortic valve regurgitation is not visualized. No aortic stenosis is present. Aortic valve mean gradient measures 6.9 mmHg. Aortic valve peak gradient measures 12.6 mmHg. Aortic valve area, by VTI measures 2.31  cm. Pulmonic Valve: The pulmonic valve was not well visualized. Pulmonic valve regurgitation is not visualized. No evidence of pulmonic stenosis. Aorta: The aortic root is normal in size and structure. Venous: IVC is small suggesting low RA pressure and hypovolemia. IAS/Shunts: No atrial level shunt detected by color flow Doppler.  LEFT VENTRICLE PLAX 2D LVIDd:         4.20 cm   Diastology LVIDs:         2.40 cm   LV e' medial:    9.95 cm/s LV PW:         0.90 cm   LV E/e' medial:  7.4 LV IVS:        0.90 cm   LV e' lateral:   13.50 cm/s LVOT diam:     1.90 cm   LV E/e' lateral: 5.4 LV SV:         61 LV SV Index:   28 LVOT Area:     2.84 cm  RIGHT VENTRICLE RV S prime:     26.70 cm/s TAPSE (M-mode): 2.4 cm LEFT ATRIUM           Index        RIGHT ATRIUM           Index LA diam:      2.80 cm 1.30 cm/m   RA Area:     13.80 cm LA Vol (A2C): 15.1 ml 7.02 ml/m   RA Volume:   33.80 ml  15.71 ml/m LA Vol (A4C): 37.4 ml 17.39 ml/m  AORTIC VALVE AV Area (Vmax):    2.03 cm AV Area (Vmean):   2.00 cm AV Area (VTI):     2.31 cm AV Vmax:            177.14 cm/s AV Vmean:          120.945 cm/s AV VTI:            0.265 m AV Peak Grad:      12.6 mmHg AV Mean Grad:      6.9 mmHg LVOT Vmax:         127.00 cm/s LVOT Vmean:        85.300 cm/s LVOT VTI:          0.216 m LVOT/AV VTI ratio: 0.81  AORTA Ao Root diam: 2.90 cm Ao Asc diam:  3.20 cm MITRAL VALVE  TRICUSPID VALVE MV Area (PHT): 4.29 cm    TR Peak grad:   32.9 mmHg MV Decel Time: 177 msec    TR Vmax:        287.00 cm/s MR Peak grad: 4.9 mmHg MR Vmax:      111.00 cm/s  SHUNTS MV E velocity: 73.30 cm/s  Systemic VTI:  0.22 m MV A velocity: 84.20 cm/s  Systemic Diam: 1.90 cm MV E/A ratio:  0.87 Dina Rich MD Electronically signed by Dina Rich MD Signature Date/Time: 07/08/2023/9:47:10 AM    Final    US Venous Img Lower Bilateral (DVT)  Result Date: 07/07/2023 CLINICAL DATA:  Bilateral lower extremity edema EXAM: BILATERAL LOWER EXTREMITY VENOUS DOPPLER ULTRASOUND TECHNIQUE: Gray-scale sonography with graded compression, as well as color Doppler and duplex ultrasound were performed to evaluate the lower extremity deep venous systems from the level of the common femoral vein and including the common femoral, femoral, profunda femoral, popliteal and calf veins including the posterior tibial, peroneal and gastrocnemius veins when visible. The superficial great saphenous vein was also interrogated. Spectral Doppler was utilized to evaluate flow at rest and with distal augmentation maneuvers in the common femoral, femoral and popliteal veins. COMPARISON:  None Available. FINDINGS: RIGHT LOWER EXTREMITY Common Femoral Vein: No evidence of thrombus. Normal compressibility, respiratory phasicity and response to augmentation. Saphenofemoral Junction: No evidence of thrombus. Normal compressibility and flow on color Doppler imaging. Profunda Femoral Vein: No evidence of thrombus. Normal compressibility and flow on color Doppler imaging. Femoral Vein: No evidence of thrombus. Normal compressibility,  respiratory phasicity and response to augmentation. Popliteal Vein: No evidence of thrombus. Normal compressibility, respiratory phasicity and response to augmentation. Calf Veins: No evidence of thrombus. Normal compressibility and flow on color Doppler imaging. Superficial Great Saphenous Vein: No evidence of thrombus. Normal compressibility. Venous Reflux:  None. Other Findings:  None. LEFT LOWER EXTREMITY Common Femoral Vein: No evidence of thrombus. Normal compressibility, respiratory phasicity and response to augmentation. Saphenofemoral Junction: No evidence of thrombus. Normal compressibility and flow on color Doppler imaging. Profunda Femoral Vein: No evidence of thrombus. Normal compressibility and flow on color Doppler imaging. Femoral Vein: No evidence of thrombus. Normal compressibility, respiratory phasicity and response to augmentation. Popliteal Vein: No evidence of thrombus. Normal compressibility, respiratory phasicity and response to augmentation. Calf Veins: No evidence of thrombus. Normal compressibility and flow on color Doppler imaging. Superficial Great Saphenous Vein: No evidence of thrombus. Normal compressibility. Venous Reflux:  None. Other Findings:  None. IMPRESSION: No evidence of deep venous thrombosis in either lower extremity. Electronically Signed   By: Malachy Moan M.D.   On: 07/07/2023 13:28   CT Angio Chest PE W and/or Wo Contrast  Result Date: 07/06/2023 CLINICAL DATA:  Sepsis; Pulmonary embolism (PE) suspected, high prob. Shortness of breath. Edema. History of lung carcinoma. Urinary incontinence. Burning with urination. * Tracking Code: BO * EXAM: CT ANGIOGRAPHY CHEST CT ABDOMEN AND PELVIS WITH CONTRAST TECHNIQUE: Multidetector CT imaging of the chest was performed using the standard protocol during bolus administration of intravenous contrast. Multiplanar CT image reconstructions and MIPs were obtained to evaluate the vascular anatomy. Multidetector CT imaging of the  abdomen and pelvis was performed using the standard protocol during bolus administration of intravenous contrast. RADIATION DOSE REDUCTION: This exam was performed according to the departmental dose-optimization program which includes automated exposure control, adjustment of the mA and/or kV according to patient size and/or use of iterative reconstruction technique. CONTRAST:  OMNIPAQUE IOHEXOL 350 MG/ML SOLN COMPARISON:  CT scan abdomen and pelvis from 06/11/2023 and CT angiography chest from 05/20/2023. FINDINGS: CTA CHEST FINDINGS Cardiovascular: No evidence of embolism to the proximal subsegmental pulmonary artery level. Please note, left lung lower lobe pulmonary artery is compressed by patient's pre-existing left lung lower lobe mass however, there is no embolism within. Normal cardiac size. No pericardial effusion. No aortic aneurysm. Mediastinum/Nodes: Visualized thyroid gland appears grossly unremarkable. No solid / cystic mediastinal masses. The esophagus is nondistended precluding optimal assessment. No axillary, mediastinal or hilar lymphadenopathy by size criteria. Lungs/Pleura: The trachea and right bronchial tree is patent. There is abrupt cutoff of left lower lobe bronchial tree. There is near complete collapse of the left lung lower lobe except few small areas of aeration of the superior segment, posteriorly. There is heterogeneous left mass however, accurate measurement is difficult on this exam however, there is significant interval increase in the size when compared to the prior exam from 05/20/2023. There is resultant postobstructive pneumonia versus atelectasis of the left lung lower lobe. There is small right pleural effusion, new since the prior study. There are associated compressive atelectatic changes in the right lung lower lobe. No right lung mass or consolidation. No suspicious lung nodules. Musculoskeletal: A CT Port-a-Cath is seen in the left upper chest wall with the catheter  terminating in the cavo-atrial junction region. Visualized soft tissues of the chest wall are otherwise grossly unremarkable. No suspicious osseous lesions. There are mild multilevel degenerative changes in the visualized spine. Review of the MIP images confirms the above findings. CT ABDOMEN and PELVIS FINDINGS Hepatobiliary: The liver is normal in size. Non-cirrhotic configuration. No suspicious mass. No intrahepatic or extrahepatic bile duct dilation. No calcified gallstones. Normal gallbladder wall thickness. No pericholecystic inflammatory changes. Pancreas: Unremarkable. No pancreatic ductal dilatation or surrounding inflammatory changes. Spleen: Within normal limits. No focal lesion. Adrenals/Urinary Tract: Redemonstration of markedly enlarged right adrenal gland measuring 8.1 x 10.3 cm, similar to the prior study. There is hyperattenuating peripheral wall with central hypoattenuating areas and intervening hyperattenuating septations. No significant interval change since the prior study. Unremarkable left adrenal gland. No suspicious renal mass. There is a stable partially exophytic 5.2 x 7.9 cm cyst arising from the right kidney upper pole, posteriorly. No hydronephrosis. No renal or ureteric calculi. Unremarkable urinary bladder. Stomach/Bowel: No disproportionate dilation of the small or large bowel loops. No evidence of abnormal bowel wall thickening or inflammatory changes. The appendix was not confidently visualized; however there is no acute inflammatory process in the right lower quadrant. Vascular/Lymphatic: No ascites or pneumoperitoneum. No abdominal or pelvic lymphadenopathy, by size criteria. No aneurysmal dilation of the major abdominal arteries. There are mild peripheral atherosclerotic vascular calcifications of the aorta and its major branches. Reproductive: Normal-size anteverted uterus. Redemonstration of thickened endometrium measuring up to 9 mm, without discrete focal mass. No large  adnexal mass. Bilateral fallopian tube closure devices noted. Other: There is a tiny fat containing umbilical hernia. The soft tissues and abdominal wall are otherwise unremarkable. Musculoskeletal: No suspicious osseous lesions. There are mild multilevel degenerative changes in the visualized spine. Review of the MIP images confirms the above findings. IMPRESSION: 1. No evidence of pulmonary embolism to the proximal subsegmental pulmonary artery level. 2. Interval increase in size of left lung lower lobe mass with resultant postobstructive pneumonia versus atelectasis of the left lung lower lobe. Increasing right pleural effusion. 3. Stable right adrenal mass. 4. Stable thickened endometrium.  No focal mass. 5. Multiple other nonacute observations, as described above. Electronically  Signed   By: Jules Schick M.D.   On: 07/06/2023 17:15   CT ABDOMEN PELVIS W CONTRAST  Result Date: 07/06/2023 CLINICAL DATA:  Sepsis; Pulmonary embolism (PE) suspected, high prob. Shortness of breath. Edema. History of lung carcinoma. Urinary incontinence. Burning with urination. * Tracking Code: BO * EXAM: CT ANGIOGRAPHY CHEST CT ABDOMEN AND PELVIS WITH CONTRAST TECHNIQUE: Multidetector CT imaging of the chest was performed using the standard protocol during bolus administration of intravenous contrast. Multiplanar CT image reconstructions and MIPs were obtained to evaluate the vascular anatomy. Multidetector CT imaging of the abdomen and pelvis was performed using the standard protocol during bolus administration of intravenous contrast. RADIATION DOSE REDUCTION: This exam was performed according to the departmental dose-optimization program which includes automated exposure control, adjustment of the mA and/or kV according to patient size and/or use of iterative reconstruction technique. CONTRAST:  OMNIPAQUE IOHEXOL 350 MG/ML SOLN COMPARISON:  CT scan abdomen and pelvis from 06/11/2023 and CT angiography chest from  05/20/2023. FINDINGS: CTA CHEST FINDINGS Cardiovascular: No evidence of embolism to the proximal subsegmental pulmonary artery level. Please note, left lung lower lobe pulmonary artery is compressed by patient's pre-existing left lung lower lobe mass however, there is no embolism within. Normal cardiac size. No pericardial effusion. No aortic aneurysm. Mediastinum/Nodes: Visualized thyroid gland appears grossly unremarkable. No solid / cystic mediastinal masses. The esophagus is nondistended precluding optimal assessment. No axillary, mediastinal or hilar lymphadenopathy by size criteria. Lungs/Pleura: The trachea and right bronchial tree is patent. There is abrupt cutoff of left lower lobe bronchial tree. There is near complete collapse of the left lung lower lobe except few small areas of aeration of the superior segment, posteriorly. There is heterogeneous left mass however, accurate measurement is difficult on this exam however, there is significant interval increase in the size when compared to the prior exam from 05/20/2023. There is resultant postobstructive pneumonia versus atelectasis of the left lung lower lobe. There is small right pleural effusion, new since the prior study. There are associated compressive atelectatic changes in the right lung lower lobe. No right lung mass or consolidation. No suspicious lung nodules. Musculoskeletal: A CT Port-a-Cath is seen in the left upper chest wall with the catheter terminating in the cavo-atrial junction region. Visualized soft tissues of the chest wall are otherwise grossly unremarkable. No suspicious osseous lesions. There are mild multilevel degenerative changes in the visualized spine. Review of the MIP images confirms the above findings. CT ABDOMEN and PELVIS FINDINGS Hepatobiliary: The liver is normal in size. Non-cirrhotic configuration. No suspicious mass. No intrahepatic or extrahepatic bile duct dilation. No calcified gallstones. Normal gallbladder  wall thickness. No pericholecystic inflammatory changes. Pancreas: Unremarkable. No pancreatic ductal dilatation or surrounding inflammatory changes. Spleen: Within normal limits. No focal lesion. Adrenals/Urinary Tract: Redemonstration of markedly enlarged right adrenal gland measuring 8.1 x 10.3 cm, similar to the prior study. There is hyperattenuating peripheral wall with central hypoattenuating areas and intervening hyperattenuating septations. No significant interval change since the prior study. Unremarkable left adrenal gland. No suspicious renal mass. There is a stable partially exophytic 5.2 x 7.9 cm cyst arising from the right kidney upper pole, posteriorly. No hydronephrosis. No renal or ureteric calculi. Unremarkable urinary bladder. Stomach/Bowel: No disproportionate dilation of the small or large bowel loops. No evidence of abnormal bowel wall thickening or inflammatory changes. The appendix was not confidently visualized; however there is no acute inflammatory process in the right lower quadrant. Vascular/Lymphatic: No ascites or pneumoperitoneum. No abdominal or pelvic  lymphadenopathy, by size criteria. No aneurysmal dilation of the major abdominal arteries. There are mild peripheral atherosclerotic vascular calcifications of the aorta and its major branches. Reproductive: Normal-size anteverted uterus. Redemonstration of thickened endometrium measuring up to 9 mm, without discrete focal mass. No large adnexal mass. Bilateral fallopian tube closure devices noted. Other: There is a tiny fat containing umbilical hernia. The soft tissues and abdominal wall are otherwise unremarkable. Musculoskeletal: No suspicious osseous lesions. There are mild multilevel degenerative changes in the visualized spine. Review of the MIP images confirms the above findings. IMPRESSION: 1. No evidence of pulmonary embolism to the proximal subsegmental pulmonary artery level. 2. Interval increase in size of left lung lower  lobe mass with resultant postobstructive pneumonia versus atelectasis of the left lung lower lobe. Increasing right pleural effusion. 3. Stable right adrenal mass. 4. Stable thickened endometrium.  No focal mass. 5. Multiple other nonacute observations, as described above. Electronically Signed   By: Jules Schick M.D.   On: 07/06/2023 17:15   DG Chest Portable 1 View  Result Date: 07/06/2023 CLINICAL DATA:  Cough and swelling EXAM: PORTABLE CHEST 1 VIEW COMPARISON:  X-ray 06/24/2023 FINDINGS: Left upper chest port. Tip along the central SVC. Stable cardiopericardial silhouette. No pneumothorax or effusion. Developing left retrocardiac opacity. New infiltrates possible. Stable left perihilar mass and possible additional nodular area in the medial right lung base as on prior. Please correlate with prior workup. Overlapping cardiac leads. Fixation hardware along the lower cervical spine at the edge of the imaging field. IMPRESSION: Developing left retrocardiac opacity.  Possible new infiltrate. Stable left perihilar lung mass with chest port Electronically Signed   By: Karen Kays M.D.   On: 07/06/2023 16:38   DG Chest Port 1 View  Result Date: 06/24/2023 CLINICAL DATA:  Port-A-Cath placement EXAM: PORTABLE CHEST 1 VIEW COMPARISON:  06/14/2023 FINDINGS: Left Port-A-Cath tip at low SVC 2 superior caval/atrial junction. Right hemidiaphragm elevation.  Cervical spine fixation. Patient rotated left. Midline trachea. Normal heart size. No pleural effusion or pneumothorax. Left perihilar mass measures 6.1 cm today versus 5.5 cm on 06/14/2023. IMPRESSION: Left Port-A-Cath terminates at the low SVC or superior caval/atrial junction, without pneumothorax. Left perihilar mass measures slightly larger today than on 06/04/2023. Difference is favored to be due to mild obliquity. Electronically Signed   By: Jeronimo Greaves M.D.   On: 06/24/2023 19:10   DG C-Arm 1-60 Min-No Report  Result Date: 06/24/2023 Fluoroscopy was  utilized by the requesting physician.  No radiographic interpretation.   DG Abd 1 View  Result Date: 06/21/2023 CLINICAL DATA:  Small bowel obstruction. EXAM: ABDOMEN - 1 VIEW COMPARISON:  June 20, 2023. FINDINGS: No abnormal bowel dilatation is noted. Surgical clips are noted in the pelvis. IMPRESSION: No abnormal bowel dilatation. Electronically Signed   By: Lupita Raider M.D.   On: 06/21/2023 09:53   CT ABDOMEN PELVIS W CONTRAST  Result Date: 06/20/2023 CLINICAL DATA:  Abdominal pain for several weeks. History of lung cancer. * Tracking Code: BO * EXAM: CT ABDOMEN AND PELVIS WITH CONTRAST TECHNIQUE: Multidetector CT imaging of the abdomen and pelvis was performed using the standard protocol following bolus administration of intravenous contrast. RADIATION DOSE REDUCTION: This exam was performed according to the departmental dose-optimization program which includes automated exposure control, adjustment of the mA and/or kV according to patient size and/or use of iterative reconstruction technique. CONTRAST:  OMNIPAQUE IOHEXOL 300 MG/ML  SOLN COMPARISON:  06/11/2023 FINDINGS: Lower chest: Rapidly enlarging mass within the  left hilar region is identified. This now measures at least 5.9 x 5.0 cm, image 9/4. On the PET-CT from 05/27/23 this measured 5.7 x 3.5 cm. New small right pleural effusion. Hepatobiliary: There is no suspicious liver abnormality. The gallbladder appears normal. No bile duct dilatation. Pancreas: Unremarkable. Spleen: No focal splenic abnormality. Spleen measures 12.6 cm cranial caudal Adrenals/Urinary Tract: Large solid and cystic mass centered around the right adrenal gland measures 10.3 by 7.9 by 13.7, image 22/2. On 06/11/2023 10.0 by 7.4 by 13.1 cm. Normal left adrenal gland. The kidneys appear unremarkable. No signs of nephrolithiasis, hydronephrosis or mass. Urinary bladder appears normal. Stomach/Bowel: Stomach appears within normal limits. Progressive distension of the  small bowel measure up to 2.9 cm with a few air-fluid levels. The distal small bowel loops have a normal caliber up to the ileocecal valve. No bowel wall thickening or inflammation. Normal caliber of the colon. Vascular/Lymphatic: Aortic atherosclerosis. Aortocaval lymph node measures 1 cm, image 33/2. No pelvic or inguinal adenopathy. Reproductive: Small fibroid arises off the subserosal right uterine corpus measuring 2.1 cm, image 79/2. The endometrial stripe measures 9 mm in thickness which is considered abnormal in a postmenopausal female. No adnexal mass. Other: No significant free fluid or fluid collections. Musculoskeletal: No acute or significant osseous findings. IMPRESSION: 1. Progressive distension of the small bowel measure up to 2.9 cm with a few air-fluid levels. The distal small bowel loops have a normal caliber up to the ileocecal valve. Although nonspecific these findings may reflect developing small bowel ileus or early small-bowel obstruction. Consider serial follow-up radiographs of the abdomen. 2. Enlarging mass within the left hilar region is identified. The visualized portions in the left lower lobe measures 5.9 x 5.0 cm. On the PET-CT from 05/27/23 the perihilar portion measured up to 5.7 x 3.5 cm. 3. 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 On 06/11/2023 10.0 by 7.4 by 13.1 cm. 4. New small right pleural effusion. 5. Endometrial stripe measures 9 mm in thickness which is considered abnormal in a postmenopausal female. Consider further evaluation with pelvic ultrasound. 6.  Aortic Atherosclerosis (ICD10-I70.0). Electronically Signed   By: Signa Kell M.D.   On: 06/20/2023 17:50   MR Brain W Wo Contrast  Result Date: 06/16/2023 CLINICAL DATA:  Provided history: Malignant neoplasm of unspecified part of unspecified bronchus or lung. Non-small cell lung cancer, staging. EXAM: MRI HEAD WITHOUT AND WITH CONTRAST TECHNIQUE: Multiplanar, multiecho  pulse sequences of the brain and surrounding structures were obtained without and with intravenous contrast. CONTRAST:  10mL GADAVIST GADOBUTROL 1 MMOL/ML IV SOLN COMPARISON:  Head CT 04/09/2016. FINDINGS: Brain: Cerebral volume is normal. 2 mm focus of enhancement along the right central sulcus which is indeterminate for a small cortical metastasis versus vascular enhancement (series 16, image 34). Small T2 hyperintense foci within the left thalamus and left lentiform nucleus, which may reflect prominent perivascular spaces or chronic lacunar infarcts. There are a few small foci of T2 FLAIR hyperintense signal abnormality within the cerebral white matter, nonspecific but compatible with minimal changes of chronic small vessel ischemia. There is no acute infarct. No chronic intracranial blood products. No extra-axial fluid collection. No midline shift. Vascular: Maintained flow voids within the proximal large arterial vessels. Small developmental venous anomaly within the anterior right frontal lobe (anatomic variant). Skull and upper cervical spine: No focal suspicious marrow lesion. Susceptibility artifact arising from ACDF hardware. Sinuses/Orbits: No mass or acute finding within the imaged orbits.  No significant paranasal sinus disease. Impression #1 will be called to the ordering clinician or representative by the Radiologist Assistant, and communication documented in the PACS or [employer]. IMPRESSION: 1. 2 mm focus of enhancement along the right central sulcus, which is indeterminate for a small cortical metastasis versus vascular enhancement. A short-interval follow-up brain MRI (with and without contrast) is recommended in 6-8 weeks. 2. Small T2 hyperintense foci within the deep gray nuclei on the left, which may reflect prominent perivascular spaces or chronic lacunar infarcts. 3. Minimal chronic small vessel ischemic changes within the cerebral white matter. Electronically Signed   By: Jackey Loge  D.O.   On: 06/16/2023 17:40   DG Chest Port 1 View  Result Date: 06/14/2023 CLINICAL DATA:  Status post bronchoscopy and biopsy EXAM: PORTABLE CHEST 1 VIEW COMPARISON:  05/07/2023 FINDINGS: Normal heart size. Left hilar mass. No acute abnormality of the lungs following bronchoscopy. The visualized skeletal structures are unremarkable. IMPRESSION: No acute abnormality of the lungs status post bronchoscopy. Left hilar mass. Electronically Signed   By: Jearld Lesch M.D.   On: 06/14/2023 13:11   DG C-ARM BRONCHOSCOPY  Result Date: 06/14/2023 C-ARM BRONCHOSCOPY: Fluoroscopy was utilized by the requesting physician.  No radiographic interpretation.   CT ABDOMEN PELVIS W CONTRAST  Result Date: 06/11/2023 CLINICAL DATA:  62 year old female with history of abdominal pain. Nausea and vomiting for the past 6 weeks. Additional history of lung cancer. * Tracking Code: BO * EXAM: CT ABDOMEN AND PELVIS WITH CONTRAST TECHNIQUE: Multidetector CT imaging of the abdomen and pelvis was performed using the standard protocol following bolus administration of intravenous contrast. RADIATION DOSE REDUCTION: This exam was performed according to the departmental dose-optimization program which includes automated exposure control, adjustment of the mA and/or kV according to patient size and/or use of iterative reconstruction technique. CONTRAST:  75mL OMNIPAQUE IOHEXOL 350 MG/ML SOLN COMPARISON:  PET-CT 05/27/2023. CT of the abdomen and pelvis 05/20/2023. FINDINGS: Lower chest: Trace right pleural effusion lying dependently. Hepatobiliary: No definite suspicious cystic or solid hepatic lesions. No intra or extrahepatic biliary ductal dilatation. Gallbladder is unremarkable in appearance. Pancreas: No pancreatic mass. No pancreatic ductal dilatation. No pancreatic or peripancreatic fluid collections or inflammatory changes. Spleen: Unremarkable. Adrenals/Urinary Tract: Large heterogeneous appearing mixed cystic and solid right  adrenal mass (axial image 25 of series 3 and coronal image 64 of series 6) which currently measures 10.6 x 7.2 x 14.6 cm, which exerts mass effect upon adjacent structures displacing the right lobe of the liver anteriorly and superiorly, and making broad contact with the upper pole of the right kidney which is mildly distorted by the adrenal mass. Left kidney and left adrenal gland are normal in appearance. No hydroureteronephrosis. Urinary bladder is unremarkable in appearance. Stomach/Bowel: The appearance of the stomach is normal. There is no pathologic dilatation of small bowel or colon. Numerous colonic diverticuli are noted, without surrounding inflammatory changes to suggest an acute diverticulitis at this time. The appendix is not confidently identified and may be surgically absent. Regardless, there are no inflammatory changes noted adjacent to the cecum to suggest the presence of an acute appendicitis at this time. Vascular/Lymphatic: Atherosclerosis in the abdominal aorta and pelvic vasculature. No lymphadenopathy noted in the abdomen or pelvis. Reproductive: Tubal ligation clips are noted bilaterally. Uterus and ovaries are otherwise unremarkable in appearance. Other: No significant volume of ascites.  No pneumoperitoneum. Musculoskeletal: There are no aggressive appearing lytic or blastic lesions noted in the visualized portions of the skeleton. IMPRESSION:  1. No definite acute findings are noted in the abdomen or pelvis. 2. Large right adrenal metastasis redemonstrated, slightly increased in size compared to prior studies, most compatible with a metastatic lesion. 3. Trace right pleural effusion lying dependently. 4. Aortic atherosclerosis. 5. Additional incidental findings, as above. Electronically Signed   By: Trudie Reed M.D.   On: 06/11/2023 05:20    Catarina Hartshorn, DO  [facility]  If 7PM-7AM, please contact night-coverage [email or web address] Password [email or web address] 07/10/2023, 4:25 PM   LOS: 4  days

## 2023-07-10 NOTE — Progress Notes (Incomplete)
Pharmacy Antibiotic Note  Miranda Perkins is a 62 y.o. female admitted on 07/06/2023 with sepsis. Pharmacy has been consulted for cefepime dosing.  Plan: Vancomycin  1500mg  IV Q 24 hrs. Goal AUC 400-550. Expected AUC: 489 SCr used: 0.8( actual 0.75) Cefepime 2gm IV q8h F/U cxs and clinical progress Monitor V/S, labs and levels as indicate    Height: 5\' 6"  (167.6 cm) Weight: 110 kg (242 lb 6.4 oz) IBW/kg (Calculated) : 59.3  Temp (24hrs), Avg:98.6 F (37 C), Min:98.1 F (36.7 C), Max:99.1 F (37.3 C)  Recent Labs  Lab 07/06/23 1305 07/06/23 1443 07/06/23 1607 07/07/23 0436 07/08/23 0430 07/09/23 0432 07/10/23 0417  WBC 12.2*  --   --  9.9 9.3 10.0 10.8*  CREATININE 0.75  --   --  0.59 0.52 0.51 0.49  LATICACIDVEN  --  2.2* 1.4  --   --   --   --     Estimated Creatinine Clearance: 89.3 mL/min (by C-G formula based on SCr of 0.49 mg/dL).    Allergies  Allergen Reactions   Influenza Vaccines Anaphylaxis and Other (See Comments)    Per patient   Penicillins Anaphylaxis and Other (See Comments)   Articaine Other (See Comments)    Caused infection   Cortisone Other (See Comments)    Turned red and ran a low grade fever for 3 days   Other Rash    bandaids- skin turns red and a rash     Antimicrobials this admission: Vancomycin 9/3 >>  Aztreonam 9/3 >>   Microbiology results: 9/3 BCx: pending  MRSA PCR:   Thank you for allowing pharmacy to be a part of this patient's care.  Elder Cyphers, BS Pharm D, BCPS Clinical Pharmacist 07/10/2023 4:04 PM

## 2023-07-11 DIAGNOSIS — J189 Pneumonia, unspecified organism: Secondary | ICD-10-CM | POA: Diagnosis not present

## 2023-07-11 DIAGNOSIS — M7989 Other specified soft tissue disorders: Secondary | ICD-10-CM | POA: Diagnosis not present

## 2023-07-11 DIAGNOSIS — C3492 Malignant neoplasm of unspecified part of left bronchus or lung: Secondary | ICD-10-CM | POA: Diagnosis not present

## 2023-07-11 LAB — BASIC METABOLIC PANEL
Anion gap: 9 (ref 5–15)
BUN: 6 mg/dL — ABNORMAL LOW (ref 8–23)
CO2: 21 mmol/L — ABNORMAL LOW (ref 22–32)
Calcium: 7.8 mg/dL — ABNORMAL LOW (ref 8.9–10.3)
Chloride: 102 mmol/L (ref 98–111)
Creatinine, Ser: 0.45 mg/dL (ref 0.44–1.00)
GFR, Estimated: >60 mL/min (ref >60)
Glucose, Bld: 81 mg/dL (ref 70–99)
Potassium: 3.6 mmol/L (ref 3.5–5.1)
Sodium: 132 mmol/L — ABNORMAL LOW (ref 135–145)

## 2023-07-11 LAB — MAGNESIUM: Magnesium: 2 mg/dL (ref 1.7–2.4)

## 2023-07-11 LAB — CULTURE, BLOOD (ROUTINE X 2)
Culture: NO GROWTH
Culture: NO GROWTH
Special Requests: ADEQUATE
Special Requests: ADEQUATE

## 2023-07-11 MED ORDER — PROCHLORPERAZINE MALEATE 5 MG PO TABS: 5.0000 mg | ORAL_TABLET | Freq: Three times a day (TID) | ORAL | Status: DC

## 2023-07-11 MED ORDER — HEPARIN SOD (PORK) LOCK FLUSH 100 UNIT/ML IV SOLN
500.0000 [IU] | Freq: Once | INTRAVENOUS | Status: AC
  Administered 2023-07-11: 500 [IU] via INTRAVENOUS
  Filled 2023-07-11: qty 5

## 2023-07-11 MED ORDER — PROCHLORPERAZINE MALEATE 5 MG PO TABS: 5.0000 mg | ORAL_TABLET | Freq: Three times a day (TID) | ORAL | 1 refills | Status: DC

## 2023-07-11 MED ORDER — POTASSIUM CHLORIDE CRYS ER 20 MEQ PO TBCR
20.0000 meq | EXTENDED_RELEASE_TABLET | Freq: Once | ORAL | Status: AC
  Administered 2023-07-11: 20 meq via ORAL
  Filled 2023-07-11: qty 1

## 2023-07-11 MED ORDER — METOPROLOL TARTRATE 25 MG PO TABS: 25.0000 mg | ORAL_TABLET | Freq: Two times a day (BID) | ORAL | 1 refills | Status: DC

## 2023-07-11 MED ORDER — VANCOMYCIN HCL 125 MG PO CAPS: 125.0000 mg | ORAL_CAPSULE | Freq: Two times a day (BID) | ORAL | Status: DC

## 2023-07-11 MED ORDER — CEFADROXIL 1 G PO TABS: 1.0000 g | ORAL_TABLET | Freq: Two times a day (BID) | ORAL | 0 refills | Status: DC

## 2023-07-11 MED ORDER — POTASSIUM CHLORIDE CRYS ER 10 MEQ PO TBCR: 10.0000 meq | EXTENDED_RELEASE_TABLET | Freq: Every day | ORAL | 0 refills | Status: DC

## 2023-07-11 MED ORDER — POTASSIUM CHLORIDE CRYS ER 10 MEQ PO TBCR: 10.0000 meq | EXTENDED_RELEASE_TABLET | Freq: Every day | ORAL | Status: DC

## 2023-07-11 MED ORDER — METRONIDAZOLE 500 MG PO TABS: 500.0000 mg | ORAL_TABLET | Freq: Two times a day (BID) | ORAL | 0 refills | Status: DC

## 2023-07-11 NOTE — Plan of Care (Signed)
  Problem: Education: Goal: Knowledge of General Education information will improve Description: Including pain rating scale, medication(s)/side effects and non-pharmacologic comfort measures Outcome: Adequate for Discharge   Problem: Health Behavior/Discharge Planning: Goal: Ability to manage health-related needs will improve Outcome: Adequate for Discharge   Problem: Clinical Measurements: Goal: Ability to maintain clinical measurements within normal limits will improve Outcome: Adequate for Discharge Goal: Will remain free from infection Outcome: Adequate for Discharge Goal: Diagnostic test results will improve Outcome: Adequate for Discharge Goal: Respiratory complications will improve Outcome: Adequate for Discharge Goal: Cardiovascular complication will be avoided Outcome: Adequate for Discharge   Problem: Activity: Goal: Risk for activity intolerance will decrease Outcome: Adequate for Discharge   Problem: Nutrition: Goal: Adequate nutrition will be maintained Outcome: Adequate for Discharge   Problem: Coping: Goal: Level of anxiety will decrease Outcome: Adequate for Discharge   Problem: Elimination: Goal: Will not experience complications related to bowel motility Outcome: Adequate for Discharge Goal: Will not experience complications related to urinary retention Outcome: Adequate for Discharge   Problem: Pain Managment: Goal: General experience of comfort will improve Outcome: Adequate for Discharge   Problem: Safety: Goal: Ability to remain free from injury will improve Outcome: Adequate for Discharge   Problem: Skin Integrity: Goal: Risk for impaired skin integrity will decrease Outcome: Adequate for Discharge   Problem: Activity: Goal: Ability to tolerate increased activity will improve Outcome: Adequate for Discharge   Problem: Clinical Measurements: Goal: Ability to maintain a body temperature in the normal range will improve Outcome: Adequate  for Discharge   Problem: Respiratory: Goal: Ability to maintain adequate ventilation will improve Outcome: Adequate for Discharge Goal: Ability to maintain a clear airway will improve Outcome: Adequate for Discharge   Problem: Acute Rehab PT Goals(only PT should resolve) Goal: Pt Will Go Supine/Side To Sit Outcome: Adequate for Discharge Goal: Patient Will Transfer Sit To/From Stand Outcome: Adequate for Discharge Goal: Pt Will Transfer Bed To Chair/Chair To Bed Outcome: Adequate for Discharge Goal: Pt Will Ambulate Outcome: Adequate for Discharge

## 2023-07-11 NOTE — Progress Notes (Signed)
Per centralized telemetry department, patient had a 5 beat run of vtach. MD on call notified.

## 2023-07-11 NOTE — Discharge Summary (Signed)
Physician Discharge Summary   Patient: Miranda Perkins MRN: [id number] DOB: 05-04-1961  Admit date:     07/06/2023  Discharge date: 07/11/23  Discharge Physician: Onalee Hua Rex Oesterle   PCP: [name], DO   Recommendations at discharge:   Please follow up with primary care provider within 1-2 weeks  Please repeat BMP and CBC in one week     Hospital Course:  62 y.o. female with medical history significant for metastatic lung adenocarcinom , CVA, adrenal hemorrhage, and c diff colitis presents with 4 days of sob and cough.  The patient had some fevers and chills.  She stated her temperature was up to 99.9 F.  She has had some posttussive emesis at home.  In addition, she has been complaining of increasing shortness of breath over the past 4 days with lower extremity edema.  She has had a small amount of blood-tinged sputum.  Patient was recently hospitalized 8/18 to 8/24 for persistent diarrhea from C. difficile infection after she had completed 10 days of Dificid.  On discharge symptoms solved, and she was discharged home to complete 10 days of oral vancomycin   She continues to have some vaginal bleeding.  She states that her dose of furosemide was recently increased to 40 mg daily.  She continues to have lower extremity edema.  ED Course: Tmax of 102.2, initial tachycardia to 135 improved after fluids now low 110s.  Respirate rate 18-22.  Blood pressure systolic 98-115.  O2 sats 93 to 95% on room air.  WBC 12.  Lactic acid 2.2 > 1.4.  COVID test negative. CTA chest negative for PE,  Interval increase in size of left lung lower lobe mass with resultant postobstructive pneumonia versus atelectasis of the left lung lower lobe. Increasing right pleural effusion. Vancomycin, aztreonam and metronidazole started. MRSA was negative.  Vanco was discontinued and she was transitioned to cefepime and metronidazole which she tolerated. Overall, the patient improved clinically with improving  respiratory status.  She remained stable on room air.  She stated that her dyspnea gradually improved.  The patient also had nausea and vomiting.  She was started on antiemetics.  Gradually this improved and she was able to tolerate a diet.  Medical oncology was consulted.  Dr. Ellin Saba saw the patient.  He recommended continuing to optimize the patient medically.  She will follow-up with him in the office to start immunotherapy the week following her discharge.  Assessment and Plan:  Lobar pneumonia/postobstructive pneumonia -07/06/2023 CTA chest negative for PE; interval increased in LLL mass with resultant postobstructive PNA versus atelectasis -initially started vancomycin, aztreonam, metronidazole -CT abd/ pelvis-Stable right adrenal mass; Stable thickening of the endometrium with no focal mass -Check PCT 4/81>>4.44>>3.74 -d/c vancomycin (MRSA neg) -tolerating cefepime and metronidazole -remains stable on RA -plan to d/c home with cefadroxil/metronidazole x 5 more days   Severe sepsis Presented with fever, tachycardia, leukocytosis -Lactic acid 2.2>> 1.4 -Follow blood cultures--neg to date -continue cefepime and metronidazole -overall sepsis physiology improved   Recent history of C. difficile colitis -Restarted oral vancomycin while on antibiotics for pneumonia -d/c home with vancomycin po bid x 6 more days   Lower extremity edema -Likely secondary to low albumin -Albumin has dropped from 4.6-2.3 in the last 2 months -Venous duplex--neg for DVT -Hold furosemide temporarily>>restart after d/c -9/5 echo EF >75%, no WMA, normal RVF; IVC small suggesting hypovolemia   Hypokalemia -Repleting -Magnesium 1.9   Metastatic primary lung cancer, left (HCC) Follows with Dr. 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  Just completed 5 treatments of radiation therapy on  adrenal mass on 07/02/23.  --appreciate Dr. Ellin Saba consult>>treat PNA and plan to start immunotherapy next week;  hold off additional XRT for now -discussed with Dr. Ellin Saba   Class 2 Obesity -lifestyle modfication -BMI 38.07   Sinus tachycardia -CTA chest--neg PE -multifactorial including medical stress and intravascular volume depletion -Echo EF >75%, no WMA -start low dose metoprolol -fluid boluses                Consultants: med/onc Procedures performed: none  Disposition: Home Diet recommendation:  Regular diet DISCHARGE MEDICATION: Allergies as of 07/11/2023       Reactions   Influenza Vaccines Anaphylaxis, Other (See Comments)   Per patient   Penicillins Anaphylaxis, Other (See Comments)   Articaine Other (See Comments)   Caused infection   Cortisone Other (See Comments)   Turned red and ran a low grade fever for 3 days   Other Rash   bandaids- skin turns red and a rash         Medication List     TAKE these medications    cefadroxil 1 g tablet Commonly known as: DURICEF Take 1 tablet (1 g total) by mouth 2 (two) times daily.   folic acid 1 MG tablet Commonly known as: FOLVITE Take 1 tablet (1 mg total) by mouth daily.   furosemide 20 MG tablet Commonly known as: Lasix Take 1 tablet (20 mg total) by mouth daily. What changed: how much to take   guaiFENesin 600 MG 12 hr tablet Commonly known as: MUCINEX Take 2 tablets (1,200 mg total) by mouth 2 (two) times daily.   lidocaine-prilocaine cream Commonly known as: EMLA Apply a quarter-sized amount to port a cath site and cover with plastic wrap 1 hour prior to infusion appointments   metoprolol tartrate 25 MG tablet Commonly known as: LOPRESSOR Take 1 tablet (25 mg total) by mouth 2 (two) times daily.   metroNIDAZOLE 500 MG tablet Commonly known as: FLAGYL Take 1 tablet (500 mg total) by mouth 2 (two) times daily.   multivitamin with minerals Tabs tablet Take 1 tablet by mouth  daily.   oxyCODONE 5 MG immediate release tablet Commonly known as: Oxy IR/ROXICODONE Take 1 tablet (5 mg total) by mouth every 4 (four) hours as needed for moderate pain.   prochlorperazine 10 MG tablet Commonly known as: COMPAZINE Take 1 tablet (10 mg total) by mouth every 6 (six) hours as needed for nausea or vomiting. What changed: Another medication with the same name was added. Make sure you understand how and when to take each.   prochlorperazine 5 MG tablet Commonly known as: COMPAZINE Take 1 tablet (5 mg total) by mouth 3 (three) times daily before meals. What changed: You were already taking a medication with the same name, and this prescription was added. Make sure you understand how and when to take each.   progesterone 200 MG capsule Commonly known as: Prometrium Nightly 10 days every 3 months   saccharomyces boulardii 250 MG capsule Commonly known as: FLORASTOR Take 1 capsule (250 mg total) by mouth 2 (two) times daily.   vancomycin 125 MG capsule Commonly known as: VANCOCIN Take 1 capsule (125 mg total) by mouth 2 (two) times daily. X 6 days        Follow-up Information     [place]  [place] Follow up.   Why: PT will call to schedule your first home visit. Contact information: [address] [place] [address] [phone]                Discharge Exam: Filed Weights   07/09/23 0500 07/10/23 0535 07/11/23 0429  Weight: 107 kg 110 kg 112.2 kg   HEENT:  [place]/AT, No thrush, no icterus CV:  RRR, no rub, no S3, no S4 Lung:  bibasilar crackles.  No wheeze Abd:  soft/+BS, NT Ext:  2 + LE edema, no lymphangitis, no synovitis, no rash   Condition at discharge: stable  The results of significant diagnostics from this hospitalization (including imaging, microbiology, ancillary and laboratory) are listed below for reference.   Imaging Studies: ECHOCARDIOGRAM COMPLETE  Result Date: 07/08/2023    ECHOCARDIOGRAM REPORT   Patient  Name:   Miranda Perkins Date of Exam: 07/08/2023 Medical Rec #:  [id number]          Height:       66.0 in Accession #:    [id number]         Weight:       237.4 lb Date of Birth:  [birth year]          BSA:          2.151 m Patient Age:    62 years           BP:           11/71 mmHg Patient Gender: F                  HR:           103 bpm. [facility] Procedure: 2D Echo, Cardiac Doppler and Color Doppler Indications:    Swelling of lower extremity [[id number]]  History:        Patient has prior history of Echocardiogram examinations, most                 recent 01/24/2021. Lung Cancer and Stroke; Signs/Symptoms:Edema.                 Sepsis.  Sonographer:    Aron Baba Referring Phys: [id number] Onnie Boer  Sonographer Comments: Patient is obese. Image acquisition challenging due to respiratory motion. IMPRESSIONS  1. Left ventricular ejection fraction, by estimation, is >75%. The left ventricle has hyperdynamic function. The left ventricle has no regional wall motion abnormalities. Left ventricular diastolic parameters are indeterminate.  2. Right ventricular systolic function is normal. The right ventricular size is mildly enlarged. There is mildly elevated pulmonary artery systolic pressure.  3. The mitral valve is normal in structure. No evidence of mitral valve regurgitation. No evidence of mitral stenosis.  4. The tricuspid valve is abnormal.  5. The aortic valve is tricuspid. Aortic valve regurgitation is not visualized. No aortic stenosis is present.  6. IVC is small suggesting low RA pressure and hypovolemia. FINDINGS  Left Ventricle: Hyperdynamic LV function creates a mild dynamic LVOT gradient, peak gradient 15 mmHg. Left ventricular ejection fraction, by estimation, is >75%. The left ventricle has hyperdynamic function. The left ventricle has no regional wall motion abnormalities. The left ventricular internal cavity size was normal in size. There is no left ventricular hypertrophy. Left  ventricular diastolic parameters are indeterminate. Right Ventricle: The right ventricular size is mildly enlarged. Right vetricular wall thickness was not well visualized. Right ventricular systolic function is normal. There is mildly elevated pulmonary artery systolic  pressure. The tricuspid regurgitant  velocity is 2.87 m/s, and with an assumed right atrial pressure of 8 mmHg, the estimated right ventricular systolic pressure is 40.9 mmHg. Left Atrium: Left atrial size was normal in size. Right Atrium: Right atrial size was normal in size. Pericardium: There is no evidence of pericardial effusion. Mitral Valve: The mitral valve is normal in structure. No evidence of mitral valve regurgitation. No evidence of mitral valve stenosis. Tricuspid Valve: The tricuspid valve is abnormal. Tricuspid valve regurgitation is mild . No evidence of tricuspid stenosis. Aortic Valve: The aortic valve is tricuspid. Aortic valve regurgitation is not visualized. No aortic stenosis is present. Aortic valve mean gradient measures 6.9 mmHg. Aortic valve peak gradient measures 12.6 mmHg. Aortic valve area, by VTI measures 2.31  cm. Pulmonic Valve: The pulmonic valve was not well visualized. Pulmonic valve regurgitation is not visualized. No evidence of pulmonic stenosis. Aorta: The aortic root is normal in size and structure. Venous: IVC is small suggesting low RA pressure and hypovolemia. IAS/Shunts: No atrial level shunt detected by color flow Doppler.  LEFT VENTRICLE PLAX 2D LVIDd:         4.20 cm   Diastology LVIDs:         2.40 cm   LV e' medial:    9.95 cm/s LV PW:         0.90 cm   LV E/e' medial:  7.4 LV IVS:        0.90 cm   LV e' lateral:   13.50 cm/s LVOT diam:     1.90 cm   LV E/e' lateral: 5.4 LV SV:         61 LV SV Index:   28 LVOT Area:     2.84 cm  RIGHT VENTRICLE RV S prime:     26.70 cm/s TAPSE (M-mode): 2.4 cm LEFT ATRIUM           Index        RIGHT ATRIUM           Index LA diam:      2.80 cm 1.30 cm/m   RA  Area:     13.80 cm LA Vol (A2C): 15.1 ml 7.02 ml/m   RA Volume:   33.80 ml  15.71 ml/m LA Vol (A4C): 37.4 ml 17.39 ml/m  AORTIC VALVE AV Area (Vmax):    2.03 cm AV Area (Vmean):   2.00 cm AV Area (VTI):     2.31 cm AV Vmax:           177.14 cm/s AV Vmean:          120.945 cm/s AV VTI:            0.265 m AV Peak Grad:      12.6 mmHg AV Mean Grad:      6.9 mmHg LVOT Vmax:         127.00 cm/s LVOT Vmean:        85.300 cm/s LVOT VTI:          0.216 m LVOT/AV VTI ratio: 0.81  AORTA Ao Root diam: 2.90 cm Ao Asc diam:  3.20 cm MITRAL VALVE               TRICUSPID VALVE MV Area (PHT): 4.29 cm    TR Peak grad:   32.9 mmHg MV Decel Time: 177 msec    TR Vmax:        287.00 cm/s MR Peak grad: 4.9 mmHg MR Vmax:  111.00 cm/s  SHUNTS MV E velocity: 73.30 cm/s  Systemic VTI:  0.22 m MV A velocity: 84.20 cm/s  Systemic Diam: 1.90 cm MV E/A ratio:  0.87 Dina Rich MD Electronically signed by Dina Rich MD Signature Date/Time: 07/08/2023/9:47:10 AM    Final    US Venous Img Lower Bilateral (DVT)  Result Date: 07/07/2023 CLINICAL DATA:  Bilateral lower extremity edema EXAM: BILATERAL LOWER EXTREMITY VENOUS DOPPLER ULTRASOUND TECHNIQUE: Gray-scale sonography with graded compression, as well as color Doppler and duplex ultrasound were performed to evaluate the lower extremity deep venous systems from the level of the common femoral vein and including the common femoral, femoral, profunda femoral, popliteal and calf veins including the posterior tibial, peroneal and gastrocnemius veins when visible. The superficial great saphenous vein was also interrogated. Spectral Doppler was utilized to evaluate flow at rest and with distal augmentation maneuvers in the common femoral, femoral and popliteal veins. COMPARISON:  None Available. FINDINGS: RIGHT LOWER EXTREMITY Common Femoral Vein: No evidence of thrombus. Normal compressibility, respiratory phasicity and response to augmentation. Saphenofemoral Junction: No  evidence of thrombus. Normal compressibility and flow on color Doppler imaging. Profunda Femoral Vein: No evidence of thrombus. Normal compressibility and flow on color Doppler imaging. Femoral Vein: No evidence of thrombus. Normal compressibility, respiratory phasicity and response to augmentation. Popliteal Vein: No evidence of thrombus. Normal compressibility, respiratory phasicity and response to augmentation. Calf Veins: No evidence of thrombus. Normal compressibility and flow on color Doppler imaging. Superficial Great Saphenous Vein: No evidence of thrombus. Normal compressibility. Venous Reflux:  None. Other Findings:  None. LEFT LOWER EXTREMITY Common Femoral Vein: No evidence of thrombus. Normal compressibility, respiratory phasicity and response to augmentation. Saphenofemoral Junction: No evidence of thrombus. Normal compressibility and flow on color Doppler imaging. Profunda Femoral Vein: No evidence of thrombus. Normal compressibility and flow on color Doppler imaging. Femoral Vein: No evidence of thrombus. Normal compressibility, respiratory phasicity and response to augmentation. Popliteal Vein: No evidence of thrombus. Normal compressibility, respiratory phasicity and response to augmentation. Calf Veins: No evidence of thrombus. Normal compressibility and flow on color Doppler imaging. Superficial Great Saphenous Vein: No evidence of thrombus. Normal compressibility. Venous Reflux:  None. Other Findings:  None. IMPRESSION: No evidence of deep venous thrombosis in either lower extremity. Electronically Signed   By: Malachy Moan M.D.   On: 07/07/2023 13:28   CT Angio Chest PE W and/or Wo Contrast  Result Date: 07/06/2023 CLINICAL DATA:  Sepsis; Pulmonary embolism (PE) suspected, high prob. Shortness of breath. Edema. History of lung carcinoma. Urinary incontinence. Burning with urination. * Tracking Code: BO * EXAM: CT ANGIOGRAPHY CHEST CT ABDOMEN AND PELVIS WITH CONTRAST TECHNIQUE:  Multidetector CT imaging of the chest was performed using the standard protocol during bolus administration of intravenous contrast. Multiplanar CT image reconstructions and MIPs were obtained to evaluate the vascular anatomy. Multidetector CT imaging of the abdomen and pelvis was performed using the standard protocol during bolus administration of intravenous contrast. RADIATION DOSE REDUCTION: This exam was performed according to the departmental dose-optimization program which includes automated exposure control, adjustment of the mA and/or kV according to patient size and/or use of iterative reconstruction technique. CONTRAST:  OMNIPAQUE IOHEXOL 350 MG/ML SOLN COMPARISON:  CT scan abdomen and pelvis from 06/11/2023 and CT angiography chest from 05/20/2023. FINDINGS: CTA CHEST FINDINGS Cardiovascular: No evidence of embolism to the proximal subsegmental pulmonary artery level. Please note, left lung lower lobe pulmonary artery is compressed by patient's pre-existing left lung lower lobe mass however,  there is no embolism within. Normal cardiac size. No pericardial effusion. No aortic aneurysm. Mediastinum/Nodes: Visualized thyroid gland appears grossly unremarkable. No solid / cystic mediastinal masses. The esophagus is nondistended precluding optimal assessment. No axillary, mediastinal or hilar lymphadenopathy by size criteria. Lungs/Pleura: The trachea and right bronchial tree is patent. There is abrupt cutoff of left lower lobe bronchial tree. There is near complete collapse of the left lung lower lobe except few small areas of aeration of the superior segment, posteriorly. There is heterogeneous left mass however, accurate measurement is difficult on this exam however, there is significant interval increase in the size when compared to the prior exam from 05/20/2023. There is resultant postobstructive pneumonia versus atelectasis of the left lung lower lobe. There is small right pleural effusion, new  since the prior study. There are associated compressive atelectatic changes in the right lung lower lobe. No right lung mass or consolidation. No suspicious lung nodules. Musculoskeletal: A CT Port-a-Cath is seen in the left upper chest wall with the catheter terminating in the cavo-atrial junction region. Visualized soft tissues of the chest wall are otherwise grossly unremarkable. No suspicious osseous lesions. There are mild multilevel degenerative changes in the visualized spine. Review of the MIP images confirms the above findings. CT ABDOMEN and PELVIS FINDINGS Hepatobiliary: The liver is normal in size. Non-cirrhotic configuration. No suspicious mass. No intrahepatic or extrahepatic bile duct dilation. No calcified gallstones. Normal gallbladder wall thickness. No pericholecystic inflammatory changes. Pancreas: Unremarkable. No pancreatic ductal dilatation or surrounding inflammatory changes. Spleen: Within normal limits. No focal lesion. Adrenals/Urinary Tract: Redemonstration of markedly enlarged right adrenal gland measuring 8.1 x 10.3 cm, similar to the prior study. There is hyperattenuating peripheral wall with central hypoattenuating areas and intervening hyperattenuating septations. No significant interval change since the prior study. Unremarkable left adrenal gland. No suspicious renal mass. There is a stable partially exophytic 5.2 x 7.9 cm cyst arising from the right kidney upper pole, posteriorly. No hydronephrosis. No renal or ureteric calculi. Unremarkable urinary bladder. Stomach/Bowel: No disproportionate dilation of the small or large bowel loops. No evidence of abnormal bowel wall thickening or inflammatory changes. The appendix was not confidently visualized; however there is no acute inflammatory process in the right lower quadrant. Vascular/Lymphatic: No ascites or pneumoperitoneum. No abdominal or pelvic lymphadenopathy, by size criteria. No aneurysmal dilation of the major abdominal  arteries. There are mild peripheral atherosclerotic vascular calcifications of the aorta and its major branches. Reproductive: Normal-size anteverted uterus. Redemonstration of thickened endometrium measuring up to 9 mm, without discrete focal mass. No large adnexal mass. Bilateral fallopian tube closure devices noted. Other: There is a tiny fat containing umbilical hernia. The soft tissues and abdominal wall are otherwise unremarkable. Musculoskeletal: No suspicious osseous lesions. There are mild multilevel degenerative changes in the visualized spine. Review of the MIP images confirms the above findings. IMPRESSION: 1. No evidence of pulmonary embolism to the proximal subsegmental pulmonary artery level. 2. Interval increase in size of left lung lower lobe mass with resultant postobstructive pneumonia versus atelectasis of the left lung lower lobe. Increasing right pleural effusion. 3. Stable right adrenal mass. 4. Stable thickened endometrium.  No focal mass. 5. Multiple other nonacute observations, as described above. Electronically Signed   By: Jules Schick M.D.   On: 07/06/2023 17:15   CT ABDOMEN PELVIS W CONTRAST  Result Date: 07/06/2023 CLINICAL DATA:  Sepsis; Pulmonary embolism (PE) suspected, high prob. Shortness of breath. Edema. History of lung carcinoma. Urinary incontinence. Burning with urination. *  Tracking Code: BO * EXAM: CT ANGIOGRAPHY CHEST CT ABDOMEN AND PELVIS WITH CONTRAST TECHNIQUE: Multidetector CT imaging of the chest was performed using the standard protocol during bolus administration of intravenous contrast. Multiplanar CT image reconstructions and MIPs were obtained to evaluate the vascular anatomy. Multidetector CT imaging of the abdomen and pelvis was performed using the standard protocol during bolus administration of intravenous contrast. RADIATION DOSE REDUCTION: This exam was performed according to the departmental dose-optimization program which includes automated exposure  control, adjustment of the mA and/or kV according to patient size and/or use of iterative reconstruction technique. CONTRAST:  OMNIPAQUE IOHEXOL 350 MG/ML SOLN COMPARISON:  CT scan abdomen and pelvis from 06/11/2023 and CT angiography chest from 05/20/2023. FINDINGS: CTA CHEST FINDINGS Cardiovascular: No evidence of embolism to the proximal subsegmental pulmonary artery level. Please note, left lung lower lobe pulmonary artery is compressed by patient's pre-existing left lung lower lobe mass however, there is no embolism within. Normal cardiac size. No pericardial effusion. No aortic aneurysm. Mediastinum/Nodes: Visualized thyroid gland appears grossly unremarkable. No solid / cystic mediastinal masses. The esophagus is nondistended precluding optimal assessment. No axillary, mediastinal or hilar lymphadenopathy by size criteria. Lungs/Pleura: The trachea and right bronchial tree is patent. There is abrupt cutoff of left lower lobe bronchial tree. There is near complete collapse of the left lung lower lobe except few small areas of aeration of the superior segment, posteriorly. There is heterogeneous left mass however, accurate measurement is difficult on this exam however, there is significant interval increase in the size when compared to the prior exam from 05/20/2023. There is resultant postobstructive pneumonia versus atelectasis of the left lung lower lobe. There is small right pleural effusion, new since the prior study. There are associated compressive atelectatic changes in the right lung lower lobe. No right lung mass or consolidation. No suspicious lung nodules. Musculoskeletal: A CT Port-a-Cath is seen in the left upper chest wall with the catheter terminating in the cavo-atrial junction region. Visualized soft tissues of the chest wall are otherwise grossly unremarkable. No suspicious osseous lesions. There are mild multilevel degenerative changes in the visualized spine. Review of the MIP images  confirms the above findings. CT ABDOMEN and PELVIS FINDINGS Hepatobiliary: The liver is normal in size. Non-cirrhotic configuration. No suspicious mass. No intrahepatic or extrahepatic bile duct dilation. No calcified gallstones. Normal gallbladder wall thickness. No pericholecystic inflammatory changes. Pancreas: Unremarkable. No pancreatic ductal dilatation or surrounding inflammatory changes. Spleen: Within normal limits. No focal lesion. Adrenals/Urinary Tract: Redemonstration of markedly enlarged right adrenal gland measuring 8.1 x 10.3 cm, similar to the prior study. There is hyperattenuating peripheral wall with central hypoattenuating areas and intervening hyperattenuating septations. No significant interval change since the prior study. Unremarkable left adrenal gland. No suspicious renal mass. There is a stable partially exophytic 5.2 x 7.9 cm cyst arising from the right kidney upper pole, posteriorly. No hydronephrosis. No renal or ureteric calculi. Unremarkable urinary bladder. Stomach/Bowel: No disproportionate dilation of the small or large bowel loops. No evidence of abnormal bowel wall thickening or inflammatory changes. The appendix was not confidently visualized; however there is no acute inflammatory process in the right lower quadrant. Vascular/Lymphatic: No ascites or pneumoperitoneum. No abdominal or pelvic lymphadenopathy, by size criteria. No aneurysmal dilation of the major abdominal arteries. There are mild peripheral atherosclerotic vascular calcifications of the aorta and its major branches. Reproductive: Normal-size anteverted uterus. Redemonstration of thickened endometrium measuring up to 9 mm, without discrete focal mass. No large adnexal mass. Bilateral  fallopian tube closure devices noted. Other: There is a tiny fat containing umbilical hernia. The soft tissues and abdominal wall are otherwise unremarkable. Musculoskeletal: No suspicious osseous lesions. There are mild multilevel  degenerative changes in the visualized spine. Review of the MIP images confirms the above findings. IMPRESSION: 1. No evidence of pulmonary embolism to the proximal subsegmental pulmonary artery level. 2. Interval increase in size of left lung lower lobe mass with resultant postobstructive pneumonia versus atelectasis of the left lung lower lobe. Increasing right pleural effusion. 3. Stable right adrenal mass. 4. Stable thickened endometrium.  No focal mass. 5. Multiple other nonacute observations, as described above. Electronically Signed   By: Jules Schick M.D.   On: 07/06/2023 17:15   DG Chest Portable 1 View  Result Date: 07/06/2023 CLINICAL DATA:  Cough and swelling EXAM: PORTABLE CHEST 1 VIEW COMPARISON:  X-ray 06/24/2023 FINDINGS: Left upper chest port. Tip along the central SVC. Stable cardiopericardial silhouette. No pneumothorax or effusion. Developing left retrocardiac opacity. New infiltrates possible. Stable left perihilar mass and possible additional nodular area in the medial right lung base as on prior. Please correlate with prior workup. Overlapping cardiac leads. Fixation hardware along the lower cervical spine at the edge of the imaging field. IMPRESSION: Developing left retrocardiac opacity.  Possible new infiltrate. Stable left perihilar lung mass with chest port Electronically Signed   By: Karen Kays M.D.   On: 07/06/2023 16:38   DG Chest Port 1 View  Result Date: 06/24/2023 CLINICAL DATA:  Port-A-Cath placement EXAM: PORTABLE CHEST 1 VIEW COMPARISON:  06/14/2023 FINDINGS: Left Port-A-Cath tip at low SVC 2 superior caval/atrial junction. Right hemidiaphragm elevation.  Cervical spine fixation. Patient rotated left. Midline trachea. Normal heart size. No pleural effusion or pneumothorax. Left perihilar mass measures 6.1 cm today versus 5.5 cm on 06/14/2023. IMPRESSION: Left Port-A-Cath terminates at the low SVC or superior caval/atrial junction, without pneumothorax. Left perihilar  mass measures slightly larger today than on 06/04/2023. Difference is favored to be due to mild obliquity. Electronically Signed   By: Jeronimo Greaves M.D.   On: 06/24/2023 19:10   DG C-Arm 1-60 Min-No Report  Result Date: 06/24/2023 Fluoroscopy was utilized by the requesting physician.  No radiographic interpretation.   DG Abd 1 View  Result Date: 06/21/2023 CLINICAL DATA:  Small bowel obstruction. EXAM: ABDOMEN - 1 VIEW COMPARISON:  June 20, 2023. FINDINGS: No abnormal bowel dilatation is noted. Surgical clips are noted in the pelvis. IMPRESSION: No abnormal bowel dilatation. Electronically Signed   By: Lupita Raider M.D.   On: 06/21/2023 09:53   CT ABDOMEN PELVIS W CONTRAST  Result Date: 06/20/2023 CLINICAL DATA:  Abdominal pain for several weeks. History of lung cancer. * Tracking Code: BO * EXAM: CT ABDOMEN AND PELVIS WITH CONTRAST TECHNIQUE: Multidetector CT imaging of the abdomen and pelvis was performed using the standard protocol following bolus administration of intravenous contrast. RADIATION DOSE REDUCTION: This exam was performed according to the departmental dose-optimization program which includes automated exposure control, adjustment of the mA and/or kV according to patient size and/or use of iterative reconstruction technique. CONTRAST:  OMNIPAQUE IOHEXOL 300 MG/ML  SOLN COMPARISON:  06/11/2023 FINDINGS: Lower chest: Rapidly enlarging mass within the left hilar region is identified. This now measures at least 5.9 x 5.0 cm, image 9/4. On the PET-CT from 05/27/23 this measured 5.7 x 3.5 cm. New small right pleural effusion. Hepatobiliary: There is no suspicious liver abnormality. The gallbladder appears normal. No bile duct dilatation. Pancreas:  Unremarkable. Spleen: No focal splenic abnormality. Spleen measures 12.6 cm cranial caudal Adrenals/Urinary Tract: Large solid and cystic mass centered around the right adrenal gland measures 10.3 by 7.9 by 13.7, image 22/2. On 06/11/2023  10.0 by 7.4 by 13.1 cm. Normal left adrenal gland. The kidneys appear unremarkable. No signs of nephrolithiasis, hydronephrosis or mass. Urinary bladder appears normal. Stomach/Bowel: Stomach appears within normal limits. Progressive distension of the small bowel measure up to 2.9 cm with a few air-fluid levels. The distal small bowel loops have a normal caliber up to the ileocecal valve. No bowel wall thickening or inflammation. Normal caliber of the colon. Vascular/Lymphatic: Aortic atherosclerosis. Aortocaval lymph node measures 1 cm, image 33/2. No pelvic or inguinal adenopathy. Reproductive: Small fibroid arises off the subserosal right uterine corpus measuring 2.1 cm, image 79/2. The endometrial stripe measures 9 mm in thickness which is considered abnormal in a postmenopausal female. No adnexal mass. Other: No significant free fluid or fluid collections. Musculoskeletal: No acute or significant osseous findings. IMPRESSION: 1. Progressive distension of the small bowel measure up to 2.9 cm with a few air-fluid levels. The distal small bowel loops have a normal caliber up to the ileocecal valve. Although nonspecific these findings may reflect developing small bowel ileus or early small-bowel obstruction. Consider serial follow-up radiographs of the abdomen. 2. Enlarging mass within the left hilar region is identified. The visualized portions in the left lower lobe measures 5.9 x 5.0 cm. On the PET-CT from 05/27/23 the perihilar portion measured up to 5.7 x 3.5 cm. 3. 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 On 06/11/2023 10.0 by 7.4 by 13.1 cm. 4. New small right pleural effusion. 5. Endometrial stripe measures 9 mm in thickness which is considered abnormal in a postmenopausal female. Consider further evaluation with pelvic ultrasound. 6.  Aortic Atherosclerosis (ICD10-I70.0). Electronically Signed   By: Signa Kell M.D.   On: 06/20/2023 17:50   MR Brain W Wo  Contrast  Result Date: 06/16/2023 CLINICAL DATA:  Provided history: Malignant neoplasm of unspecified part of unspecified bronchus or lung. Non-small cell lung cancer, staging. EXAM: MRI HEAD WITHOUT AND WITH CONTRAST TECHNIQUE: Multiplanar, multiecho pulse sequences of the brain and surrounding structures were obtained without and with intravenous contrast. CONTRAST:  10mL GADAVIST GADOBUTROL 1 MMOL/ML IV SOLN COMPARISON:  Head CT 04/09/2016. FINDINGS: Brain: Cerebral volume is normal. 2 mm focus of enhancement along the right central sulcus which is indeterminate for a small cortical metastasis versus vascular enhancement (series 16, image 34). Small T2 hyperintense foci within the left thalamus and left lentiform nucleus, which may reflect prominent perivascular spaces or chronic lacunar infarcts. There are a few small foci of T2 FLAIR hyperintense signal abnormality within the cerebral white matter, nonspecific but compatible with minimal changes of chronic small vessel ischemia. There is no acute infarct. No chronic intracranial blood products. No extra-axial fluid collection. No midline shift. Vascular: Maintained flow voids within the proximal large arterial vessels. Small developmental venous anomaly within the anterior right frontal lobe (anatomic variant). Skull and upper cervical spine: No focal suspicious marrow lesion. Susceptibility artifact arising from ACDF hardware. Sinuses/Orbits: No mass or acute finding within the imaged orbits. No significant paranasal sinus disease. Impression #1 will be called to the ordering clinician or representative by the Radiologist Assistant, and communication documented in the PACS or [employer]. IMPRESSION: 1. 2 mm focus of enhancement along the right central sulcus, which is indeterminate for a small cortical  metastasis versus vascular enhancement. A short-interval follow-up brain MRI (with and without contrast) is recommended in 6-8 weeks. 2. Small T2  hyperintense foci within the deep gray nuclei on the left, which may reflect prominent perivascular spaces or chronic lacunar infarcts. 3. Minimal chronic small vessel ischemic changes within the cerebral white matter. Electronically Signed   By: Jackey Loge D.O.   On: 06/16/2023 17:40   DG Chest Port 1 View  Result Date: 06/14/2023 CLINICAL DATA:  Status post bronchoscopy and biopsy EXAM: PORTABLE CHEST 1 VIEW COMPARISON:  05/07/2023 FINDINGS: Normal heart size. Left hilar mass. No acute abnormality of the lungs following bronchoscopy. The visualized skeletal structures are unremarkable. IMPRESSION: No acute abnormality of the lungs status post bronchoscopy. Left hilar mass. Electronically Signed   By: Jearld Lesch M.D.   On: 06/14/2023 13:11   DG C-ARM BRONCHOSCOPY  Result Date: 06/14/2023 C-ARM BRONCHOSCOPY: Fluoroscopy was utilized by the requesting physician.  No radiographic interpretation.    Microbiology: Results for orders placed or performed during the hospital encounter of 07/06/23  SARS Coronavirus 2 by RT PCR (hospital order, performed in [facility] hospital lab) *cepheid single result test* Anterior Nasal Swab     Status: None   Collection Time: 07/06/23  2:20 PM   Specimen: Anterior Nasal Swab  Result Value Ref Range Status   SARS Coronavirus 2 by RT PCR NEGATIVE NEGATIVE Final    Comment: (NOTE) SARS-CoV-2 target nucleic acids are NOT DETECTED.  The SARS-CoV-2 RNA is generally detectable in upper and lower respiratory specimens during the acute phase of infection. The lowest concentration of SARS-CoV-2 viral copies this assay can detect is 250 copies / mL. A negative result does not preclude SARS-CoV-2 infection and should not be used as the sole basis for treatment or other patient management decisions.  A negative result may occur with improper specimen collection / handling, submission of specimen other than nasopharyngeal swab, presence of viral mutation(s) within  the areas targeted by this assay, and inadequate number of viral copies (<250 copies / mL). A negative result must be combined with clinical observations, patient history, and epidemiological information.  Fact Sheet for Patients:   RoadLapTop.co.za  Fact Sheet for Healthcare Providers: http://kim-miller.com/  This test is not yet approved or  cleared by the [place] FDA and has been authorized for detection and/or diagnosis of SARS-CoV-2 by FDA under an Emergency Use Authorization (EUA).  This EUA will remain in effect (meaning this test can be used) for the duration of the COVID-19 declaration under Section 564(b)(1) of the Act, 21 U.S.C. section 360bbb-3(b)(1), unless the authorization is terminated or revoked sooner.  Performed at [facility], [address] [place], [place] [address]   Blood Culture (routine x 2)     Status: None   Collection Time: 07/06/23  2:43 PM   Specimen: BLOOD  Result Value Ref Range Status   Specimen Description BLOOD BLOOD RIGHT ARM  Final   Special Requests   Final    BOTTLES DRAWN AEROBIC AND ANAEROBIC Blood Culture adequate volume   Culture   Final    NO GROWTH 5 DAYS Performed at [facility], [address] [place], [place] [address]    Report Status 07/11/2023 FINAL  Final  Blood Culture (routine x 2)     Status: None   Collection Time: 07/06/23  2:43 PM   Specimen: BLOOD  Result Value Ref Range Status   Specimen Description BLOOD BLOOD LEFT ARM  Final   Special  Requests   Final    BOTTLES DRAWN AEROBIC AND ANAEROBIC Blood Culture adequate volume   Culture   Final    NO GROWTH 5 DAYS Performed at [facility], [address] [place], [place] [address]    Report Status 07/11/2023 FINAL  Final  MRSA Next Gen by PCR, Nasal     Status: None   Collection Time: 07/07/23  8:58 AM   Specimen: Nasal Mucosa; Nasal Swab  Result Value Ref Range Status   MRSA by PCR Next Gen NOT DETECTED NOT  DETECTED Final    Comment: (NOTE) The GeneXpert MRSA Assay (FDA approved for NASAL specimens only), is one component of a comprehensive MRSA colonization surveillance program. It is not intended to diagnose MRSA infection nor to guide or monitor treatment for MRSA infections. Test performance is not FDA approved in patients less than 1 years old. Performed at [facility], [address], [place], [place] [address]     Labs: CBC: Recent Labs  Lab 07/06/23 1305 07/07/23 0436 07/08/23 0430 07/09/23 0432 07/10/23 0417  WBC 12.2* 9.9 9.3 10.0 10.8*  NEUTROABS 10.9*  --   --   --   --   HGB 11.7* 9.6* 9.3* 9.3* 9.6*  HCT 37.7 30.8* 30.7* 30.9* 31.6*  MCV 81.1 81.7 83.2 83.3 82.9  PLT 527* 416* 411* 390 357   Basic Metabolic Panel: Recent Labs  Lab 07/06/23 1305 07/07/23 0436 07/08/23 0430 07/09/23 0432 07/10/23 0417 07/11/23 0355  NA 133* 133* 135 134* 133* 132*  K 3.5 3.1* 3.5 3.4* 3.7 3.6  CL 97* 98 101 101 100 102  CO2 24 25 24 23 25  21*  GLUCOSE 166* 104* 97 96 93 81  BUN 14 11 10 8 8  6*  CREATININE 0.75 0.59 0.52 0.51 0.49 0.45  CALCIUM 8.2* 7.9* 8.0* 8.0* 8.0* 7.8*  MG 2.0  --   --  1.9 2.0 2.0   Liver Function Tests: Recent Labs  Lab 07/06/23 1305 07/08/23 0430  AST 66* 15  ALT 53* 26  ALKPHOS 168* 121  BILITOT 0.8 0.6  PROT 6.3* 5.1*  ALBUMIN 2.3* 1.9*   CBG: No results for input(s): "GLUCAP" in the last 168 hours.  Discharge time spent: greater than 30 minutes.  Signed: Catarina Hartshorn, MD [facility] 07/11/2023

## 2023-07-12 ENCOUNTER — Ambulatory Visit: Admitting: Family Medicine

## 2023-07-12 ENCOUNTER — Other Ambulatory Visit: Payer: Self-pay | Admitting: *Deleted

## 2023-07-12 ENCOUNTER — Encounter: Payer: Self-pay | Admitting: *Deleted

## 2023-07-12 ENCOUNTER — Telehealth: Payer: Self-pay | Admitting: Dietician

## 2023-07-12 ENCOUNTER — Inpatient Hospital Stay: Attending: Hematology | Admitting: Dietician

## 2023-07-12 DIAGNOSIS — Z5111 Encounter for antineoplastic chemotherapy: Secondary | ICD-10-CM | POA: Insufficient documentation

## 2023-07-12 DIAGNOSIS — C3492 Malignant neoplasm of unspecified part of left bronchus or lung: Secondary | ICD-10-CM | POA: Insufficient documentation

## 2023-07-12 DIAGNOSIS — Z79899 Other long term (current) drug therapy: Secondary | ICD-10-CM | POA: Insufficient documentation

## 2023-07-12 DIAGNOSIS — C7971 Secondary malignant neoplasm of right adrenal gland: Secondary | ICD-10-CM | POA: Insufficient documentation

## 2023-07-12 DIAGNOSIS — Z87891 Personal history of nicotine dependence: Secondary | ICD-10-CM | POA: Insufficient documentation

## 2023-07-12 NOTE — Transitions of Care (Post Inpatient/ED Visit) (Signed)
07/12/2023  Name: Miranda Perkins MRN: [id number] DOB: 1961/08/28  Today's TOC FU Call Status: Today's TOC FU Call Status:: Successful TOC FU Call Completed TOC FU Call Complete Date: 07/12/23 Patient's Name and Date of Birth confirmed.  Transition Care Management Follow-up Telephone Call Date of Discharge: 07/11/23 [facility] Type of Discharge: Inpatient Admission Primary Inpatient Discharge Diagnosis:: sepsis How have you been since you were released from the hospital?: Better Any questions or concerns?: No  Items Reviewed: Did you receive and understand the discharge instructions provided?: Yes Medications obtained,verified, and reconciled?: Yes (Medications Reviewed) Any new allergies since your discharge?: No Dietary orders reviewed?: Yes Type of Diet Ordered:: Regular diet Do you have support at home?: Yes People in Home: spouse Name of Support/Comfort Primary Source: Miranda Perkins  Medications Reviewed Today: Medications Reviewed Today     Reviewed by Miranda Gallus, RN (Registered Nurse) on 07/12/23 at 1212  Med List Status: <None>   Medication Order Taking? Sig Documenting Provider Last Dose Status Informant  cefadroxil (DURICEF) 1 g tablet [id number] Yes Take 1 tablet (1 g total) by mouth 2 (two) times daily. Miranda Hartshorn, MD Taking Active   folic acid (FOLVITE) 1 MG tablet [id number] Yes Take 1 tablet (1 mg total) by mouth daily. Miranda Massed, MD Taking Active Spouse/Significant Other, Self  furosemide (LASIX) 20 MG tablet [id number] Yes Take 1 tablet (20 mg total) by mouth daily.  Patient taking differently: Take 40 mg by mouth daily.   Miranda Hale, MD Taking Active Spouse/Significant Other, Self           Med Note (Perkins, Miranda Labrador   Tue Jul 06, 2023  3:28 PM) Pt takes 40 mg tabs  guaiFENesin (MUCINEX) 600 MG 12 hr tablet [id number] Yes Take 2 tablets (1,200 mg total) by mouth 2 (two) times daily. Miranda Hale, MD Taking  Active Spouse/Significant Other, Self  lidocaine-prilocaine (EMLA) cream [id number] No Apply a quarter-sized amount to port a cath site and cover with plastic wrap 1 hour prior to infusion appointments  Patient not taking: Reported on 07/06/2023   Miranda Massed, MD Not Taking Active Spouse/Significant Other, Self           Med Note Miranda Perkins, Miranda Perkins Jul 06, 2023  3:32 PM) Pt has not started this yet.  metoprolol tartrate (LOPRESSOR) 25 MG tablet [id number] Yes Take 1 tablet (25 mg total) by mouth 2 (two) times daily. Miranda Hartshorn, MD Taking Active   metroNIDAZOLE (FLAGYL) 500 MG tablet [id number] Yes Take 1 tablet (500 mg total) by mouth 2 (two) times daily. Miranda Hartshorn, MD Taking Active   Multiple Vitamin (MULTIVITAMIN WITH MINERALS) TABS tablet [id number] Yes Take 1 tablet by mouth daily. Miranda Hale, MD Taking Active Spouse/Significant Other, Self           Med Note Miranda Perkins, Miranda Perkins Jul 06, 2023  3:29 PM) Pt requests a refill  oxyCODONE (OXY IR/ROXICODONE) 5 MG immediate release tablet [id number] Yes Take 1 tablet (5 mg total) by mouth every 4 (four) hours as needed for moderate pain. Miranda Massed, MD Taking Active Spouse/Significant Other, Self  potassium chloride (KLOR-CON M) 10 MEQ tablet [id number] Yes Take 1 tablet (10 mEq total) by mouth daily. Miranda Hartshorn, MD Taking Active   prochlorperazine (COMPAZINE) 10 MG tablet [id number] Yes Take 1 tablet (10 mg total) by mouth every 6 (six) hours as needed for nausea or vomiting. Miranda Massed, MD Taking Active Spouse/Significant Other,  Self  prochlorperazine (COMPAZINE) 5 MG tablet [id number] Yes Take 1 tablet (5 mg total) by mouth 3 (three) times daily before meals. Miranda Hartshorn, MD Taking Active   progesterone (PROMETRIUM) 200 MG capsule [id number] Yes Nightly 10 days every 3 months Perkins, Miranda Dyke, MD Taking Active Self, Spouse/Significant Other           Med Note Miranda Perkins, Miranda Perkins Jul 06, 2023  3:30 PM) Yesterday's dose  was the 5th day.  saccharomyces boulardii (FLORASTOR) 250 MG capsule [id number] Yes Take 1 capsule (250 mg total) by mouth 2 (two) times daily. Miranda Hale, MD Taking Active Spouse/Significant Other, Self  vancomycin (VANCOCIN) 125 MG capsule [id number] Yes Take 1 capsule (125 mg total) by mouth 2 (two) times daily. X 6 days Tat, Onalee Hua, MD Taking Active   Med List Note Benjamine Mola 05/07/23 1326): [employer] patient            Home Care and Equipment/Supplies: Were Home Health Services Ordered?: Yes Name of Home Health Agency:: [facility] Has Agency set up a time to come to your home?: Yes First Home Health Visit Date: 07/12/23 Any new equipment or medical supplies ordered?: No  Functional Questionnaire: Do you need assistance with bathing/showering or dressing?: Yes (spouse assists) Do you need assistance with meal preparation?: Yes (spouse assists) Do you need assistance with eating?: No Do you have difficulty maintaining continence: No Do you need assistance with getting out of bed/getting out of a chair/moving?: Yes (spouse assists as needed if pt feels weak) Do you have difficulty managing or taking your medications?: No  Follow up appointments reviewed: PCP Follow-up appointment confirmed?: No (spouse states he will call today and make follow up appointment) MD Provider Line Number:[phone] Given: No Specialist Hospital Follow-up appointment confirmed?: Yes Date of Specialist follow-up appointment?: 08/02/23 Follow-Up Specialty Provider:: oncology- Miranda Perkins,   Nutritionist is to call pt today at 230 pm  Miranda Perkins,  spouse reports pt has resources from home health, cancer center and in process of being placed in palliative care Do you need transportation to your follow-up appointment?: No Do you understand care options if your condition(s) worsen?: Yes-patient verbalized understanding   Miranda Perkins [facility], BSN [place]/  Ambulatory Care Management [phone]

## 2023-07-12 NOTE — Telephone Encounter (Addendum)
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 Pt has completed 4/5 planned fractions to right adrenal under the care of Dr. Langston Masker (final 8/30). Planning to start immunotherapy 9/11.   9/3-9/8 [facility] admission with PNA, BLE swelling 8/18-8/24 [facility] admission with ileus, persistent C diff 8/9-8/11 [facility] admission with intractable N/V  Spoke with pt via telephone. She is feeling sleepy at call as she was discharged home from hospital yesterday. Patient reports eating 3 times/day. She notes what an improvement this is. Pt unable to recall foods eaten yesterday (yogurt, banana, Boost for breakfast, chicken broth, yogurt for lunch). Pt reports nausea is well controlled with compazine. Thick secretions continue. She is having ~2 BM/day. Reports stools are soft and formed.     Medications: reviewed   Labs: 9/8 - Na 132, BUN 6  Anthropometrics: Wt 247 lb 5.7 oz on 9/8 (hospital wt; fluid)  7/30 - 234 lb 9.6 oz  NUTRITION DIAGNOSIS: Unintended wt loss - suspect ongoing    INTERVENTION:  Encouraged small frequent meals/snacks q2h Continue drinking Boost Plus/equivalent, recommend 2/day in between meals    MONITORING, EVALUATION, GOAL: wt trends, intake   NEXT VISIT: Monday September 16 via telephone

## 2023-07-12 NOTE — Progress Notes (Signed)
Pharmacist Chemotherapy Monitoring - Initial Assessment    Anticipated start date: 06/13/23   The following has been reviewed per standard work regarding the patient's treatment regimen: The patient's diagnosis, treatment plan and drug doses, and organ/hematologic function Lab orders and baseline tests specific to treatment regimen  The treatment plan start date, drug sequencing, and pre-medications Prior authorization status  Patient's documented medication list, including drug-drug interaction screen and prescriptions for anti-emetics and supportive care specific to the treatment regimen The drug concentrations, fluid compatibility, administration routes, and timing of the medications to be used The patient's access for treatment and lifetime cumulative dose history, if applicable  The patient's medication allergies and previous infusion related reactions, if applicable   Changes made to treatment plan:  Discontinue diphenhydramine from oncology treatment plan --> Add Quzyttir (cetirizine) 10 mg IVPush x 1 as premedication for oncology treatment plan.   Follow up needed:  N/A   Miranda Perkins, [facility], 07/12/2023  4:28 PM

## 2023-07-12 NOTE — Progress Notes (Signed)
See telephone note.

## 2023-07-13 NOTE — Progress Notes (Signed)
[facility] [address], [place] [address]    Clinic Day:  07/14/2023  Referring physician: [name], DO  Patient Care Team: [name], DO as PCP - General (Family Medicine) [name] [name], MD as PCP - Cardiology (Cardiology) [name], MD as Medical Oncologist (Medical Oncology) [name], RN as Oncology Nurse Navigator (Medical Oncology)   ASSESSMENT & PLAN:   Assessment: 1.  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  Continues along the superior margin of the right kidney. - CTAP on 05/07/2023: Interval increase in size of the right adrenal gland measuring 7.6 cm, previously 4.9 cm.  A large area of decreased attenuation noted likely related to resolving hematoma.  No active hemorrhage seen. - CTAP (05/20/2023): Further enlargement of right adrenal gland measuring 8.9 x 6.7 cm, interim finding of moderate slightly loculated fluid surrounding the superior pole of the right kidney. - CT chest angiogram (05/20/2023): Solid left hilar mass measuring 5.3 x 3.9 cm with partial encasement of left-sided pulmonary vessels.  Mild narrowing of the left lower lobe bronchi but no occlusion.  Irregular focus in the left lower lobe measuring 16 x 8 mm. - Guardant360: BRIP1, PTEN deletion,STK11,KRAS G12A, KRAS amplification - NGS: PD-L1 (22 C3) TPS score 75%.  K-ras G 12A, STK 11. - Cycle 1 of carboplatin, pemetrexed, nivolumab and ipilimumab (Checkmate 9 LA) started on 07/14/2023   2.  Social/family history: - Lives with husband at home.  Independent of ADLs and IADLs.  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  Quit cigarette smoking in 2010 and smoked half pack per day for 15 years. - No family history of bleeding.  Mother had 3 miscarriages and 5 levels.  No family history of antiphospholipid syndrome.  Father and paternal grandfather had cancer.  Maternal first cousin had blood cancer.    Plan: 1.  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 - We discussed chemoimmunotherapy regimen in detail. - Will start her chemotherapy at 20% dose reduction due to her recent health issues. - Reviewed labs today: Normal LFTs.  Albumin is low at 2.1.  CBC grossly normal. - Continue folic acid 1 mg tablet daily.  She already received B12 injection on more than week ago. - Proceed with cycle 1 today.  She will be evaluated in the symptom management clinic in 7 to 10 days.  RTC 3 weeks with me for follow-up for cycle 2.   2.  Abdominal and right back pain: - Continue oxycodone 5 mg daily as needed.   3.  Nausea: - Continue Zofran/Compazine as needed.   4.  C. difficile colitis: - She is currently taking p.o. vancomycin as she is receiving antibiotics for her recent possible postobstructive pneumonia which will be completed on 07/16/2023.   5.  Vaginal bleeding: - She reports continued vaginal bleeding.  She has reportedly completed progesterone given by Dr.Eure.  I have recommended that she reach out to his office.   6.  Nutrition: - Consume protein rich foods.   7.  Leg  swellings: - Most likely from hypoalbuminemia of 2.1.  Continue Lasix 40 mg a daily as needed.  She has not taken it in the last few days.    Orders Placed This Encounter  Procedures   CBC with Differential    Standing Status:   Future    Standing Expiration Date:   07/13/2024   Comprehensive metabolic panel    Standing Status:   Future    Standing Expiration Date:   07/13/2024   Magnesium    Standing  Status:   Future    Standing Expiration Date:   07/13/2024      I,Katie Daubenspeck,acting as a scribe for [name], MD.,have documented all relevant documentation on the behalf of [name], MD,as directed by  [name], MD while in the presence of [name], MD.   I, [name] MD, have reviewed the above documentation for accuracy and completeness, and I agree with the above.   [name], MD   9/11/20245:42 PM  CHIEF COMPLAINT:   Diagnosis: Metastatic lung cancer to the adrenal gland    Cancer Staging  Metastatic primary lung cancer, left [facility]) Staging form: Lung, AJCC 8th Edition - Clinical stage from 07/01/2023: Stage IVB (cT3, cN0, pM1c) - Signed by [name], MD on 07/01/2023    Prior Therapy: none  Current Therapy: Carboplatin, pemetrexed, nivolumab and ipilimumab   HISTORY OF PRESENT ILLNESS:   Oncology History  Metastatic primary lung cancer, left (HCC)  06/23/2023 Initial Diagnosis   Metastatic primary lung cancer, left (HCC)   07/01/2023 Cancer Staging   Staging form: Lung, AJCC 8th Edition - Clinical stage from 07/01/2023: Stage IVB (cT3, cN0, pM1c) - Signed by [name], MD on 07/01/2023 Histopathologic type: Adenocarcinoma, NOS   07/14/2023 -  Chemotherapy   Patient is on Treatment Plan : LUNG NSCLC NON-SQUAMOUS Nivolumab + Ipilimumab + Carboplatin + Pemetrexed q42d X 1 cycle / Maintenance Nivolumab + Ipilimumab q42d        INTERVAL HISTORY:   Miranda Perkins is a 62 y.o. female presenting to clinic today for follow up of Metastatic lung cancer to the adrenal gland. She was last seen by me on 07/01/23.  Since her last visit, she developed severe and painful bilateral lower extremity edema, extreme weakness, and difficulty with nutrition. We referred her to the ED that day, on 07/06/23. She was subsequently admitted with severe sepsis. She was treated for this and pneumonia.  Today, she  states that she is doing well overall. Her appetite level is at 10%. Her energy level is at 10%.  PAST MEDICAL HISTORY:   Past Medical History: Past Medical History:  Diagnosis Date   Adrenal hemorrhage (HCC)    Anemia    Arthritis    Cancer (HCC)    skin cancer - basal   Dyspnea    Edema of both lower extremities    Endometrial polyp    Hirsutism    History of 2019 novel coronavirus disease (COVID-19) 12/21/2020   positive home result documented in pcp note in epic 12-23-2020,  residual doe, pulmonology consult w/ dr wert note in epic 03-20-2021   History of basal cell carcinoma (BCC) excision 2016   forehead, per pt no recurrence   History of CVA (cerebrovascular accident) without residual deficits 1993   pt stated , while on Chloramphenicol, no residuaL; [medication given to treat tick bite] ; patient states  "i stroked out right next to my doctor , he said i passed out and that was my  only symptom" ;  no resiudal   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 branch,  work-up results in epic 03/ 2022 (normal nuclear stress test, normal echo, no arrhythmia's per event monitor);   (05-28-2021 per pt no symptoms since 03/ 2022)   Multiple thyroid nodules    endocrinologist--- dr g. nida,  hx benign bx, clinically euthyroid   Osteopenia 12/2017   T score -1.2 FRAX 4.9% / 0.3%   PMB (postmenopausal bleeding)    Pneumonia    x 2   Post-COVID chronic dyspnea    Pre-diabetes    Rosacea    Stroke (HCC)    Thickened endometrium     Surgical History: Past Surgical History:  Procedure Laterality Date   ANTERIOR AND POSTERIOR REPAIR N/A 08/21/2020   Procedure: ANTERIOR (CYSTOCELE) AND POSTERIOR REPAIR (RECTOCELE);  Surgeon: Genia Del, MD;  Location: [facility];  Service: Gynecology;  Laterality: N/A;   requesting 9:00am OR time  requests one hour   ANTERIOR CERVICAL DECOMP/DISCECTOMY FUSION N/A 09/27/2017   Procedure: ANTERIOR CERVICAL DECOMPRESSION/DISCECTOMY FUSION CERVICAL FOUR-FIVE ,CERVICAL FIVE-SIX,CERVICAL SIX-SEVEN;  Surgeon: Donalee Citrin, MD;  Location: [facility] OR;  Service: Neurosurgery;  Laterality: N/A;   AXILLARY LYMPH NODE BIOPSY Right 06/24/2023   Procedure: AXILLARY LYMPH NODE BIOPSY;  Surgeon: Franky Macho, MD;  Location: [facility];  Service: General;  Laterality: Right;   BRONCHIAL BIOPSY  06/14/2023   Procedure: BRONCHIAL BIOPSIES;  Surgeon: Leslye Peer, MD;  Location: [facility] ENDOSCOPY;  Service: Pulmonary;;   BRONCHIAL BRUSHINGS  06/14/2023   Procedure: BRONCHIAL BRUSHINGS;  Surgeon: Leslye Peer, MD;  Location: [facility] ENDOSCOPY;  Service: Pulmonary;;   BRONCHIAL NEEDLE ASPIRATION BIOPSY  06/14/2023   Procedure: BRONCHIAL NEEDLE ASPIRATION BIOPSIES;  Surgeon: Leslye Peer, MD;  Location: [facility] ENDOSCOPY;  Service: Pulmonary;;   CARPAL TUNNEL RELEASE Left 09/27/2017   Procedure: CARPAL TUNNEL RELEASE;  Surgeon: Donalee Citrin, MD;  Location: [facility] OR;  Service: Neurosurgery;  Laterality: Left;   DILATATION & CURETTAGE/HYSTEROSCOPY WITH MYOSURE N/A 06/03/2021   Procedure: DILATATION & CURETTAGE/HYSTEROSCOPY WITH MYOSURE;  Surgeon: Genia Del, MD;  Location: [facility];  Service: Gynecology;  Laterality: N/A;   FOOT SURGERY     bone removed from pinky toe   HEMOSTASIS CONTROL  06/14/2023   Procedure: HEMOSTASIS CONTROL;  Surgeon: Leslye Peer, MD;  Location: [facility] ENDOSCOPY;  Service: Pulmonary;;   INGUINAL HERNIA REPAIR Right 1995   KNEE ARTHROSCOPY W/ MENISCAL REPAIR Left 06/21/2017   dr Netta Corrigan   LEG SURGERY  1972   MOHS SURGERY  2016   [facility] of forehead   MOUTH SURGERY  2015   removal and cauterization pallete of cancerous lesion   PORTACATH PLACEMENT N/A 06/24/2023   Procedure: INSERTION PORT-A-CATH;  Surgeon: Franky Macho, MD;  Location: [facility];  Service:  General;  Laterality: N/A;   TONSILLECTOMY  1979   TONSILLECTOMY  1977   TOTAL KNEE ARTHROPLASTY Left 01/26/2018   Procedure: LEFT TOTAL KNEE ARTHROPLASTY;  Surgeon: Ranee Gosselin, MD;  Location: [facility]S;  Service: Orthopedics;  Laterality: Left;   TUBAL LIGATION     VIDEO BRONCHOSCOPY WITH ENDOBRONCHIAL ULTRASOUND Bilateral 06/14/2023   Procedure: VIDEO BRONCHOSCOPY WITH ENDOBRONCHIAL ULTRASOUND;  Surgeon: Leslye Peer, MD;  Location: [facility] ENDOSCOPY;  Service: Pulmonary;  Laterality: Bilateral;  Social History: Social History   Socioeconomic History   Marital status: Married    Spouse name: Not on file   Number of children: 3   Years of education: Not on file   Highest education level: 12th grade  Occupational History   Occupation: retired    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, television/film/video for [employer]   Tobacco Use   Smoking status: Former    Current packs/day: 0.00    Average packs/day: 0.5 packs/day for 35.0 years (17.5 ttl pk-yrs)    Types: Cigarettes    Start date: 11/26/1974    Quit date: 11/26/2009    Years since quitting: 13.6   Smokeless tobacco: Never  Vaping Use   Vaping status: Never Used  Substance and Sexual Activity   Alcohol use: Not Currently   Drug use: No   Sexual activity: Not Currently    Partners: Male    Birth control/protection: Post-menopausal    Comment: 1st intercourse 42 yo-5 partners, married- 25 yrs  Other Topics Concern   Not on file  Social History Narrative   Lives with husband. Raising 2 grandchildren of deceased daughter.       Lives in one story ranch house.    Social Determinants of Health   Financial Resource Strain: Low Risk  (06/09/2023)   Overall Financial Resource Strain (CARDIA)    Difficulty of Paying Living Expenses: Not hard at all  Food Insecurity: No Food Insecurity (07/12/2023)   Hunger Vital Sign    Worried About Running Out of Food in the Last Year: Never true    Ran Out of Food in the Last Year: Never true  Transportation Needs:  No Transportation Needs (07/12/2023)   PRAPARE - Administrator, Civil Service (Medical): No    Lack of Transportation (Non-Medical): No  Physical Activity: Inactive (06/09/2023)   Exercise Vital Sign    Days of Exercise per Week: 0 days    Minutes of Exercise per Session: 90 min  Stress: Stress Concern Present (06/09/2023)   [employer] of Occupational Health - Occupational Stress Questionnaire    Feeling of Stress : To some extent  Social Connections: Unknown (06/09/2023)   Social Connection and Isolation Panel [NHANES]    Frequency of Communication with Friends and Family: More than three times a week    Frequency of Social Gatherings with Friends and Family: More than three times a week    Attends Religious Services: Patient declined    Database administrator or Organizations: No    Attends Banker Meetings: Never    Marital Status: Married  Recent Concern: Social Connections - Moderately Isolated (04/27/2023)   Social Connection and Isolation Panel [NHANES]    Frequency of Communication with Friends and Family: More than three times a week    Frequency of Social Gatherings with Friends and Family: Never    Attends Religious Services: Never    Database administrator or Organizations: No    Attends Banker Meetings: Never    Marital Status: Married  Catering manager Violence: Not At Risk (07/06/2023)   Humiliation, Afraid, Rape, and Kick questionnaire    Fear of Current or Ex-Partner: No    Emotionally Abused: No    Physically Abused: No    Sexually Abused: No    Family History: Family History  Problem Relation Age of Onset   Diabetes Mother    Heart Problems Mother    Rheum arthritis Mother    COPD Mother  Polycystic kidney disease Mother    Carpal tunnel syndrome Mother    Cancer Father        Liver Cancer   Liver cancer Father    Carpal tunnel syndrome Sister    Thyroid disease Sister    Rheum arthritis Sister    Lung disease  Sister    Diabetes Sister    Carpal tunnel syndrome Sister    Clotting disorder Sister    Rheum arthritis Sister    Thyroid disease Sister    COPD Sister    Heart Problems Sister    Gout Sister    Cancer Brother    Heart Problems Maternal Grandmother    Cancer Maternal Grandfather        Lung Cancer   Heart Problems Maternal Grandfather    Heart Problems Paternal Grandmother    Cancer Paternal Grandfather        Brain & Skin cancer   Polycystic ovary syndrome Daughter    Rheum arthritis Maternal Aunt    Fibromyalgia Maternal Aunt    Polycystic kidney disease Maternal Aunt    Heart Problems Maternal Aunt    Anemia Maternal Aunt    Adrenal disorder Maternal Aunt    Carpal tunnel syndrome Maternal Aunt    Diabetes Maternal Aunt    Thyroid disease Maternal Aunt    Rheum arthritis Maternal Aunt    Diabetes Maternal Aunt    Diabetes Maternal Aunt    Rheum arthritis Maternal Aunt    Heart Problems Maternal Aunt    Rheum arthritis Maternal Uncle    Heart Problems Maternal Uncle     Current Medications:  Current Outpatient Medications:    cefadroxil (DURICEF) 1 g tablet, Take 1 tablet (1 g total) by mouth 2 (two) times daily., Disp: 10 tablet, Rfl: 0   folic acid (FOLVITE) 1 MG tablet, Take 1 tablet (1 mg total) by mouth daily., Disp: 90 tablet, Rfl: 3   furosemide (LASIX) 20 MG tablet, Take 1 tablet (20 mg total) by mouth daily. (Patient taking differently: Take 40 mg by mouth daily.), Disp: 30 tablet, Rfl: 1   guaiFENesin (MUCINEX) 600 MG 12 hr tablet, Take 2 tablets (1,200 mg total) by mouth 2 (two) times daily., Disp: 20 tablet, Rfl: 0   lidocaine-prilocaine (EMLA) cream, Apply a quarter-sized amount to port a cath site and cover with plastic wrap 1 hour prior to infusion appointments, Disp: 30 g, Rfl: 0   metoprolol tartrate (LOPRESSOR) 25 MG tablet, Take 1 tablet (25 mg total) by mouth 2 (two) times daily., Disp: 30 tablet, Rfl: 1   metroNIDAZOLE (FLAGYL) 500 MG tablet, Take  1 tablet (500 mg total) by mouth 2 (two) times daily., Disp: 9 tablet, Rfl: 0   Multiple Vitamin (MULTIVITAMIN WITH MINERALS) TABS tablet, Take 1 tablet by mouth daily., Disp: 100 tablet, Rfl: 1   oxyCODONE (OXY IR/ROXICODONE) 5 MG immediate release tablet, Take 1 tablet (5 mg total) by mouth every 4 (four) hours as needed for moderate pain., Disp: 180 tablet, Rfl: 0   potassium chloride (KLOR-CON M) 10 MEQ tablet, Take 1 tablet (10 mEq total) by mouth daily., Disp: 30 tablet, Rfl: 0   prochlorperazine (COMPAZINE) 10 MG tablet, Take 1 tablet (10 mg total) by mouth every 6 (six) hours as needed for nausea or vomiting., Disp: 60 tablet, Rfl: 2   prochlorperazine (COMPAZINE) 5 MG tablet, Take 1 tablet (5 mg total) by mouth 3 (three) times daily before meals., Disp: 90 tablet, Rfl: 1  progesterone (PROMETRIUM) 200 MG capsule, Nightly 10 days every 3 months, Disp: 30 capsule, Rfl: 4   saccharomyces boulardii (FLORASTOR) 250 MG capsule, Take 1 capsule (250 mg total) by mouth 2 (two) times daily., Disp: 30 capsule, Rfl: 0   vancomycin (VANCOCIN) 125 MG capsule, Take 1 capsule (125 mg total) by mouth 2 (two) times daily. X 6 days, Disp: , Rfl:    lidocaine-prilocaine (EMLA) cream, Apply to affected area once, Disp: 30 g, Rfl: 3 No current facility-administered medications for this visit.  Facility-Administered Medications Ordered in Other Visits:    sodium chloride flush (NS) 0.9 % injection 10 mL, 10 mL, Intracatheter, PRN, [name], MD, 10 mL at 07/14/23 1410   Allergies: Allergies  Allergen Reactions   Influenza Vaccines Anaphylaxis and Other (See Comments)    Per patient   Penicillins Anaphylaxis and Other (See Comments)   Articaine Other (See Comments)    Caused infection   Cortisone Other (See Comments)    Turned red and ran a low grade fever for 3 days   Other Rash    bandaids- skin turns red and a rash     REVIEW OF SYSTEMS:   Review of Systems  Constitutional:  Negative  for chills, fatigue and fever.  HENT:   Negative for lump/mass, mouth sores, nosebleeds, sore throat and trouble swallowing.   Eyes:  Negative for eye problems.  Respiratory:  Positive for cough and shortness of breath.   Cardiovascular:  Positive for leg swelling. Negative for chest pain and palpitations.  Gastrointestinal:  Positive for diarrhea, nausea and vomiting. Negative for abdominal pain and constipation.  Genitourinary:  Negative for bladder incontinence, difficulty urinating, dysuria, frequency, hematuria and nocturia.   Musculoskeletal:  Negative for arthralgias, back pain, flank pain, myalgias and neck pain.  Skin:  Negative for itching and rash.  Neurological:  Negative for dizziness, headaches and numbness.  Hematological:  Does not bruise/bleed easily.  Psychiatric/Behavioral:  Positive for sleep disturbance. Negative for depression and suicidal ideas. The patient is not nervous/anxious.   All other systems reviewed and are negative.    VITALS:   There were no vitals taken for this visit.  Wt Readings from Last 3 Encounters:  07/14/23 243 lb 14.4 oz (110.6 kg)  07/11/23 247 lb 5.7 oz (112.2 kg)  06/20/23 227 lb 1.2 oz (103 kg)    There is no height or weight on file to calculate BMI.  Performance status (ECOG): 2 - Symptomatic, <50% confined to bed  PHYSICAL EXAM:   Physical Exam Vitals and nursing note reviewed. Exam conducted with a chaperone present.  Constitutional:      Appearance: Normal appearance.  Cardiovascular:     Rate and Rhythm: Normal rate and regular rhythm.     Pulses: Normal pulses.     Heart sounds: Normal heart sounds.  Pulmonary:     Effort: Pulmonary effort is normal.     Breath sounds: Normal breath sounds.  Abdominal:     Palpations: Abdomen is soft. There is no hepatomegaly, splenomegaly or mass.     Tenderness: There is no abdominal tenderness.  Musculoskeletal:     Right lower leg: Edema present.     Left lower leg: Edema  present.  Lymphadenopathy:     Cervical: No cervical adenopathy.     Right cervical: No superficial, deep or posterior cervical adenopathy.    Left cervical: No superficial, deep or posterior cervical adenopathy.     Upper Body:     Right  upper body: No supraclavicular or axillary adenopathy.     Left upper body: No supraclavicular or axillary adenopathy.  Neurological:     General: No focal deficit present.     Mental Status: She is alert and oriented to person, place, and time.  Psychiatric:        Mood and Affect: Mood normal.        Behavior: Behavior normal.     LABS:      Latest Ref Rng & Units 07/14/2023    9:06 AM 07/10/2023    4:17 AM 07/09/2023    4:32 AM  CBC  WBC 4.0 - 10.5 K/uL 10.3  10.8  10.0   Hemoglobin 12.0 - 15.0 g/dL 09.8  9.6  9.3   Hematocrit 36.0 - 46.0 % 33.2  31.6  30.9   Platelets 150 - 400 K/uL 395  357  390       Latest Ref Rng & Units 07/14/2023    9:06 AM 07/11/2023    3:55 AM 07/10/2023    4:17 AM  CMP  Glucose 70 - 99 mg/dL 119  81  93   BUN 8 - 23 mg/dL 7  6  8    Creatinine 0.44 - 1.00 mg/dL 1.47  8.29  5.62   Sodium 135 - 145 mmol/L 130  132  133   Potassium 3.5 - 5.1 mmol/L 3.3  3.6  3.7   Chloride 98 - 111 mmol/L 97  102  100   CO2 22 - 32 mmol/L 24  21  25    Calcium 8.9 - 10.3 mg/dL 7.8  7.8  8.0   Total Protein 6.5 - 8.1 g/dL 5.8     Total Bilirubin 0.3 - 1.2 mg/dL 0.5     Alkaline Phos 38 - 126 U/L 139     AST 15 - 41 U/L 14     ALT 0 - 44 U/L 14        No results found for: "CEA1", "CEA" / No results found for: "CEA1", "CEA" No results found for: "PSA1" No results found for: "ZHY865" No results found for: "CAN125"  No results found for: "TOTALPROTELP", "ALBUMINELP", "A1GS", "A2GS", "BETS", "BETA2SER", "GAMS", "MSPIKE", "SPEI" No results found for: "TIBC", "FERRITIN", "IRONPCTSAT" No results found for: "LDH"   STUDIES:   ECHOCARDIOGRAM COMPLETE  Result Date: 07/08/2023    ECHOCARDIOGRAM REPORT   Patient Name:   Miranda Perkins  [place] Date of Exam: 07/08/2023 Medical Rec #:  [id number]          Height:       66.0 in Accession #:    [id number]         Weight:       237.4 lb Date of Birth:  1961/07/26          BSA:          2.151 m Patient Age:    62 years           BP:           11/71 mmHg Patient Gender: F                  HR:           103 bpm. [facility] Procedure: 2D Echo, Cardiac Doppler and Color Doppler Indications:    Swelling of lower extremity [[id number]]  History:        Patient has prior history of Echocardiogram examinations, most  recent 01/24/2021. Lung Cancer and Stroke; Signs/Symptoms:Edema.                 Sepsis.  Sonographer:    Aron Baba Referring Phys: [id number] Onnie Boer  Sonographer Comments: Patient is obese. Image acquisition challenging due to respiratory motion. IMPRESSIONS  1. Left ventricular ejection fraction, by estimation, is >75%. The left ventricle has hyperdynamic function. The left ventricle has no regional wall motion abnormalities. Left ventricular diastolic parameters are indeterminate.  2. Right ventricular systolic function is normal. The right ventricular size is mildly enlarged. There is mildly elevated pulmonary artery systolic pressure.  3. The mitral valve is normal in structure. No evidence of mitral valve regurgitation. No evidence of mitral stenosis.  4. The tricuspid valve is abnormal.  5. The aortic valve is tricuspid. Aortic valve regurgitation is not visualized. No aortic stenosis is present.  6. IVC is small suggesting low RA pressure and hypovolemia. FINDINGS  Left Ventricle: Hyperdynamic LV function creates a mild dynamic LVOT gradient, peak gradient 15 mmHg. Left ventricular ejection fraction, by estimation, is >75%. The left ventricle has hyperdynamic function. The left ventricle has no regional wall motion abnormalities. The left ventricular internal cavity size was normal in size. There is no left ventricular hypertrophy. Left ventricular  diastolic parameters are indeterminate. Right Ventricle: The right ventricular size is mildly enlarged. Right vetricular wall thickness was not well visualized. Right ventricular systolic function is normal. There is mildly elevated pulmonary artery systolic pressure. The tricuspid regurgitant  velocity is 2.87 m/s, and with an assumed right atrial pressure of 8 mmHg, the estimated right ventricular systolic pressure is 40.9 mmHg. Left Atrium: Left atrial size was normal in size. Right Atrium: Right atrial size was normal in size. Pericardium: There is no evidence of pericardial effusion. Mitral Valve: The mitral valve is normal in structure. No evidence of mitral valve regurgitation. No evidence of mitral valve stenosis. Tricuspid Valve: The tricuspid valve is abnormal. Tricuspid valve regurgitation is mild . No evidence of tricuspid stenosis. Aortic Valve: The aortic valve is tricuspid. Aortic valve regurgitation is not visualized. No aortic stenosis is present. Aortic valve mean gradient measures 6.9 mmHg. Aortic valve peak gradient measures 12.6 mmHg. Aortic valve area, by VTI measures 2.31  cm. Pulmonic Valve: The pulmonic valve was not well visualized. Pulmonic valve regurgitation is not visualized. No evidence of pulmonic stenosis. Aorta: The aortic root is normal in size and structure. Venous: IVC is small suggesting low RA pressure and hypovolemia. IAS/Shunts: No atrial level shunt detected by color flow Doppler.  LEFT VENTRICLE PLAX 2D LVIDd:         4.20 cm   Diastology LVIDs:         2.40 cm   LV e' medial:    9.95 cm/s LV PW:         0.90 cm   LV E/e' medial:  7.4 LV IVS:        0.90 cm   LV e' lateral:   13.50 cm/s LVOT diam:     1.90 cm   LV E/e' lateral: 5.4 LV SV:         61 LV SV Index:   28 LVOT Area:     2.84 cm  RIGHT VENTRICLE RV S prime:     26.70 cm/s TAPSE (M-mode): 2.4 cm LEFT ATRIUM           Index        RIGHT ATRIUM  Index LA diam:      2.80 cm 1.30 cm/m   RA Area:     13.80  cm LA Vol (A2C): 15.1 ml 7.02 ml/m   RA Volume:   33.80 ml  15.71 ml/m LA Vol (A4C): 37.4 ml 17.39 ml/m  AORTIC VALVE AV Area (Vmax):    2.03 cm AV Area (Vmean):   2.00 cm AV Area (VTI):     2.31 cm AV Vmax:           177.14 cm/s AV Vmean:          120.945 cm/s AV VTI:            0.265 m AV Peak Grad:      12.6 mmHg AV Mean Grad:      6.9 mmHg LVOT Vmax:         127.00 cm/s LVOT Vmean:        85.300 cm/s LVOT VTI:          0.216 m LVOT/AV VTI ratio: 0.81  AORTA Ao Root diam: 2.90 cm Ao Asc diam:  3.20 cm MITRAL VALVE               TRICUSPID VALVE MV Area (PHT): 4.29 cm    TR Peak grad:   32.9 mmHg MV Decel Time: 177 msec    TR Vmax:        287.00 cm/s MR Peak grad: 4.9 mmHg MR Vmax:      111.00 cm/s  SHUNTS MV E velocity: 73.30 cm/s  Systemic VTI:  0.22 m MV A velocity: 84.20 cm/s  Systemic Diam: 1.90 cm MV E/A ratio:  0.87 Dina Rich MD Electronically signed by Dina Rich MD Signature Date/Time: 07/08/2023/9:47:10 AM    Final    US Venous Img Lower Bilateral (DVT)  Result Date: 07/07/2023 CLINICAL DATA:  Bilateral lower extremity edema EXAM: BILATERAL LOWER EXTREMITY VENOUS DOPPLER ULTRASOUND TECHNIQUE: Gray-scale sonography with graded compression, as well as color Doppler and duplex ultrasound were performed to evaluate the lower extremity deep venous systems from the level of the common femoral vein and including the common femoral, femoral, profunda femoral, popliteal and calf veins including the posterior tibial, peroneal and gastrocnemius veins when visible. The superficial great saphenous vein was also interrogated. Spectral Doppler was utilized to evaluate flow at rest and with distal augmentation maneuvers in the common femoral, femoral and popliteal veins. COMPARISON:  None Available. FINDINGS: RIGHT LOWER EXTREMITY Common Femoral Vein: No evidence of thrombus. Normal compressibility, respiratory phasicity and response to augmentation. Saphenofemoral Junction: No evidence of thrombus.  Normal compressibility and flow on color Doppler imaging. Profunda Femoral Vein: No evidence of thrombus. Normal compressibility and flow on color Doppler imaging. Femoral Vein: No evidence of thrombus. Normal compressibility, respiratory phasicity and response to augmentation. Popliteal Vein: No evidence of thrombus. Normal compressibility, respiratory phasicity and response to augmentation. Calf Veins: No evidence of thrombus. Normal compressibility and flow on color Doppler imaging. Superficial Great Saphenous Vein: No evidence of thrombus. Normal compressibility. Venous Reflux:  None. Other Findings:  None. LEFT LOWER EXTREMITY Common Femoral Vein: No evidence of thrombus. Normal compressibility, respiratory phasicity and response to augmentation. Saphenofemoral Junction: No evidence of thrombus. Normal compressibility and flow on color Doppler imaging. Profunda Femoral Vein: No evidence of thrombus. Normal compressibility and flow on color Doppler imaging. Femoral Vein: No evidence of thrombus. Normal compressibility, respiratory phasicity and response to augmentation. Popliteal Vein: No evidence of thrombus. Normal compressibility, respiratory phasicity and response to augmentation.  Calf Veins: No evidence of thrombus. Normal compressibility and flow on color Doppler imaging. Superficial Great Saphenous Vein: No evidence of thrombus. Normal compressibility. Venous Reflux:  None. Other Findings:  None. IMPRESSION: No evidence of deep venous thrombosis in either lower extremity. Electronically Signed   By: Malachy Moan M.D.   On: 07/07/2023 13:28   CT Angio Chest PE W and/or Wo Contrast  Result Date: 07/06/2023 CLINICAL DATA:  Sepsis; Pulmonary embolism (PE) suspected, high prob. Shortness of breath. Edema. History of lung carcinoma. Urinary incontinence. Burning with urination. * Tracking Code: BO * EXAM: CT ANGIOGRAPHY CHEST CT ABDOMEN AND PELVIS WITH CONTRAST TECHNIQUE: Multidetector CT imaging of the  chest was performed using the standard protocol during bolus administration of intravenous contrast. Multiplanar CT image reconstructions and MIPs were obtained to evaluate the vascular anatomy. Multidetector CT imaging of the abdomen and pelvis was performed using the standard protocol during bolus administration of intravenous contrast. RADIATION DOSE REDUCTION: This exam was performed according to the departmental dose-optimization program which includes automated exposure control, adjustment of the mA and/or kV according to patient size and/or use of iterative reconstruction technique. CONTRAST:  OMNIPAQUE IOHEXOL 350 MG/ML SOLN COMPARISON:  CT scan abdomen and pelvis from 06/11/2023 and CT angiography chest from 05/20/2023. FINDINGS: CTA CHEST FINDINGS Cardiovascular: No evidence of embolism to the proximal subsegmental pulmonary artery level. Please note, left lung lower lobe pulmonary artery is compressed by patient's pre-existing left lung lower lobe mass however, there is no embolism within. Normal cardiac size. No pericardial effusion. No aortic aneurysm. Mediastinum/Nodes: Visualized thyroid gland appears grossly unremarkable. No solid / cystic mediastinal masses. The esophagus is nondistended precluding optimal assessment. No axillary, mediastinal or hilar lymphadenopathy by size criteria. Lungs/Pleura: The trachea and right bronchial tree is patent. There is abrupt cutoff of left lower lobe bronchial tree. There is near complete collapse of the left lung lower lobe except few small areas of aeration of the superior segment, posteriorly. There is heterogeneous left mass however, accurate measurement is difficult on this exam however, there is significant interval increase in the size when compared to the prior exam from 05/20/2023. There is resultant postobstructive pneumonia versus atelectasis of the left lung lower lobe. There is small right pleural effusion, new since the prior study. There are  associated compressive atelectatic changes in the right lung lower lobe. No right lung mass or consolidation. No suspicious lung nodules. Musculoskeletal: A CT Port-a-Cath is seen in the left upper chest wall with the catheter terminating in the cavo-atrial junction region. Visualized soft tissues of the chest wall are otherwise grossly unremarkable. No suspicious osseous lesions. There are mild multilevel degenerative changes in the visualized spine. Review of the MIP images confirms the above findings. CT ABDOMEN and PELVIS FINDINGS Hepatobiliary: The liver is normal in size. Non-cirrhotic configuration. No suspicious mass. No intrahepatic or extrahepatic bile duct dilation. No calcified gallstones. Normal gallbladder wall thickness. No pericholecystic inflammatory changes. Pancreas: Unremarkable. No pancreatic ductal dilatation or surrounding inflammatory changes. Spleen: Within normal limits. No focal lesion. Adrenals/Urinary Tract: Redemonstration of markedly enlarged right adrenal gland measuring 8.1 x 10.3 cm, similar to the prior study. There is hyperattenuating peripheral wall with central hypoattenuating areas and intervening hyperattenuating septations. No significant interval change since the prior study. Unremarkable left adrenal gland. No suspicious renal mass. There is a stable partially exophytic 5.2 x 7.9 cm cyst arising from the right kidney upper pole, posteriorly. No hydronephrosis. No renal or ureteric calculi. Unremarkable urinary bladder. Stomach/Bowel:  No disproportionate dilation of the small or large bowel loops. No evidence of abnormal bowel wall thickening or inflammatory changes. The appendix was not confidently visualized; however there is no acute inflammatory process in the right lower quadrant. Vascular/Lymphatic: No ascites or pneumoperitoneum. No abdominal or pelvic lymphadenopathy, by size criteria. No aneurysmal dilation of the major abdominal arteries. There are mild peripheral  atherosclerotic vascular calcifications of the aorta and its major branches. Reproductive: Normal-size anteverted uterus. Redemonstration of thickened endometrium measuring up to 9 mm, without discrete focal mass. No large adnexal mass. Bilateral fallopian tube closure devices noted. Other: There is a tiny fat containing umbilical hernia. The soft tissues and abdominal wall are otherwise unremarkable. Musculoskeletal: No suspicious osseous lesions. There are mild multilevel degenerative changes in the visualized spine. Review of the MIP images confirms the above findings. IMPRESSION: 1. No evidence of pulmonary embolism to the proximal subsegmental pulmonary artery level. 2. Interval increase in size of left lung lower lobe mass with resultant postobstructive pneumonia versus atelectasis of the left lung lower lobe. Increasing right pleural effusion. 3. Stable right adrenal mass. 4. Stable thickened endometrium.  No focal mass. 5. Multiple other nonacute observations, as described above. Electronically Signed   By: Jules Schick M.D.   On: 07/06/2023 17:15   CT ABDOMEN PELVIS W CONTRAST  Result Date: 07/06/2023 CLINICAL DATA:  Sepsis; Pulmonary embolism (PE) suspected, high prob. Shortness of breath. Edema. History of lung carcinoma. Urinary incontinence. Burning with urination. * Tracking Code: BO * EXAM: CT ANGIOGRAPHY CHEST CT ABDOMEN AND PELVIS WITH CONTRAST TECHNIQUE: Multidetector CT imaging of the chest was performed using the standard protocol during bolus administration of intravenous contrast. Multiplanar CT image reconstructions and MIPs were obtained to evaluate the vascular anatomy. Multidetector CT imaging of the abdomen and pelvis was performed using the standard protocol during bolus administration of intravenous contrast. RADIATION DOSE REDUCTION: This exam was performed according to the departmental dose-optimization program which includes automated exposure control, adjustment of the mA and/or  kV according to patient size and/or use of iterative reconstruction technique. CONTRAST:  OMNIPAQUE IOHEXOL 350 MG/ML SOLN COMPARISON:  CT scan abdomen and pelvis from 06/11/2023 and CT angiography chest from 05/20/2023. FINDINGS: CTA CHEST FINDINGS Cardiovascular: No evidence of embolism to the proximal subsegmental pulmonary artery level. Please note, left lung lower lobe pulmonary artery is compressed by patient's pre-existing left lung lower lobe mass however, there is no embolism within. Normal cardiac size. No pericardial effusion. No aortic aneurysm. Mediastinum/Nodes: Visualized thyroid gland appears grossly unremarkable. No solid / cystic mediastinal masses. The esophagus is nondistended precluding optimal assessment. No axillary, mediastinal or hilar lymphadenopathy by size criteria. Lungs/Pleura: The trachea and right bronchial tree is patent. There is abrupt cutoff of left lower lobe bronchial tree. There is near complete collapse of the left lung lower lobe except few small areas of aeration of the superior segment, posteriorly. There is heterogeneous left mass however, accurate measurement is difficult on this exam however, there is significant interval increase in the size when compared to the prior exam from 05/20/2023. There is resultant postobstructive pneumonia versus atelectasis of the left lung lower lobe. There is small right pleural effusion, new since the prior study. There are associated compressive atelectatic changes in the right lung lower lobe. No right lung mass or consolidation. No suspicious lung nodules. Musculoskeletal: A CT Port-a-Cath is seen in the left upper chest wall with the catheter terminating in the cavo-atrial junction region. Visualized soft tissues of the chest  wall are otherwise grossly unremarkable. No suspicious osseous lesions. There are mild multilevel degenerative changes in the visualized spine. Review of the MIP images confirms the above findings. CT  ABDOMEN and PELVIS FINDINGS Hepatobiliary: The liver is normal in size. Non-cirrhotic configuration. No suspicious mass. No intrahepatic or extrahepatic bile duct dilation. No calcified gallstones. Normal gallbladder wall thickness. No pericholecystic inflammatory changes. Pancreas: Unremarkable. No pancreatic ductal dilatation or surrounding inflammatory changes. Spleen: Within normal limits. No focal lesion. Adrenals/Urinary Tract: Redemonstration of markedly enlarged right adrenal gland measuring 8.1 x 10.3 cm, similar to the prior study. There is hyperattenuating peripheral wall with central hypoattenuating areas and intervening hyperattenuating septations. No significant interval change since the prior study. Unremarkable left adrenal gland. No suspicious renal mass. There is a stable partially exophytic 5.2 x 7.9 cm cyst arising from the right kidney upper pole, posteriorly. No hydronephrosis. No renal or ureteric calculi. Unremarkable urinary bladder. Stomach/Bowel: No disproportionate dilation of the small or large bowel loops. No evidence of abnormal bowel wall thickening or inflammatory changes. The appendix was not confidently visualized; however there is no acute inflammatory process in the right lower quadrant. Vascular/Lymphatic: No ascites or pneumoperitoneum. No abdominal or pelvic lymphadenopathy, by size criteria. No aneurysmal dilation of the major abdominal arteries. There are mild peripheral atherosclerotic vascular calcifications of the aorta and its major branches. Reproductive: Normal-size anteverted uterus. Redemonstration of thickened endometrium measuring up to 9 mm, without discrete focal mass. No large adnexal mass. Bilateral fallopian tube closure devices noted. Other: There is a tiny fat containing umbilical hernia. The soft tissues and abdominal wall are otherwise unremarkable. Musculoskeletal: No suspicious osseous lesions. There are mild multilevel degenerative changes in the  visualized spine. Review of the MIP images confirms the above findings. IMPRESSION: 1. No evidence of pulmonary embolism to the proximal subsegmental pulmonary artery level. 2. Interval increase in size of left lung lower lobe mass with resultant postobstructive pneumonia versus atelectasis of the left lung lower lobe. Increasing right pleural effusion. 3. Stable right adrenal mass. 4. Stable thickened endometrium.  No focal mass. 5. Multiple other nonacute observations, as described above. Electronically Signed   By: Jules Schick M.D.   On: 07/06/2023 17:15   DG Chest Portable 1 View  Result Date: 07/06/2023 CLINICAL DATA:  Cough and swelling EXAM: PORTABLE CHEST 1 VIEW COMPARISON:  X-ray 06/24/2023 FINDINGS: Left upper chest port. Tip along the central SVC. Stable cardiopericardial silhouette. No pneumothorax or effusion. Developing left retrocardiac opacity. New infiltrates possible. Stable left perihilar mass and possible additional nodular area in the medial right lung base as on prior. Please correlate with prior workup. Overlapping cardiac leads. Fixation hardware along the lower cervical spine at the edge of the imaging field. IMPRESSION: Developing left retrocardiac opacity.  Possible new infiltrate. Stable left perihilar lung mass with chest port Electronically Signed   By: Karen Kays M.D.   On: 07/06/2023 16:38   DG Chest Port 1 View  Result Date: 06/24/2023 CLINICAL DATA:  Port-A-Cath placement EXAM: PORTABLE CHEST 1 VIEW COMPARISON:  06/14/2023 FINDINGS: Left Port-A-Cath tip at low SVC 2 superior caval/atrial junction. Right hemidiaphragm elevation.  Cervical spine fixation. Patient rotated left. Midline trachea. Normal heart size. No pleural effusion or pneumothorax. Left perihilar mass measures 6.1 cm today versus 5.5 cm on 06/14/2023. IMPRESSION: Left Port-A-Cath terminates at the low SVC or superior caval/atrial junction, without pneumothorax. Left perihilar mass measures slightly larger  today than on 06/04/2023. Difference is favored to be due to mild obliquity.  Electronically Signed   By: Jeronimo Greaves M.D.   On: 06/24/2023 19:10   DG C-Arm 1-60 Min-No Report  Result Date: 06/24/2023 Fluoroscopy was utilized by the requesting physician.  No radiographic interpretation.   DG Abd 1 View  Result Date: 06/21/2023 CLINICAL DATA:  Small bowel obstruction. EXAM: ABDOMEN - 1 VIEW COMPARISON:  June 20, 2023. FINDINGS: No abnormal bowel dilatation is noted. Surgical clips are noted in the pelvis. IMPRESSION: No abnormal bowel dilatation. Electronically Signed   By: Lupita Raider M.D.   On: 06/21/2023 09:53   CT ABDOMEN PELVIS W CONTRAST  Result Date: 06/20/2023 CLINICAL DATA:  Abdominal pain for several weeks. History of lung cancer. * Tracking Code: BO * EXAM: CT ABDOMEN AND PELVIS WITH CONTRAST TECHNIQUE: Multidetector CT imaging of the abdomen and pelvis was performed using the standard protocol following bolus administration of intravenous contrast. RADIATION DOSE REDUCTION: This exam was performed according to the departmental dose-optimization program which includes automated exposure control, adjustment of the mA and/or kV according to patient size and/or use of iterative reconstruction technique. CONTRAST:  OMNIPAQUE IOHEXOL 300 MG/ML  SOLN COMPARISON:  06/11/2023 FINDINGS: Lower chest: Rapidly enlarging mass within the left hilar region is identified. This now measures at least 5.9 x 5.0 cm, image 9/4. On the PET-CT from 05/27/23 this measured 5.7 x 3.5 cm. New small right pleural effusion. Hepatobiliary: There is no suspicious liver abnormality. The gallbladder appears normal. No bile duct dilatation. Pancreas: Unremarkable. Spleen: No focal splenic abnormality. Spleen measures 12.6 cm cranial caudal Adrenals/Urinary Tract: Large solid and cystic mass centered around the right adrenal gland measures 10.3 by 7.9 by 13.7, image 22/2. On 06/11/2023 10.0 by 7.4 by 13.1 cm. Normal  left adrenal gland. The kidneys appear unremarkable. No signs of nephrolithiasis, hydronephrosis or mass. Urinary bladder appears normal. Stomach/Bowel: Stomach appears within normal limits. Progressive distension of the small bowel measure up to 2.9 cm with a few air-fluid levels. The distal small bowel loops have a normal caliber up to the ileocecal valve. No bowel wall thickening or inflammation. Normal caliber of the colon. Vascular/Lymphatic: Aortic atherosclerosis. Aortocaval lymph node measures 1 cm, image 33/2. No pelvic or inguinal adenopathy. Reproductive: Small fibroid arises off the subserosal right uterine corpus measuring 2.1 cm, image 79/2. The endometrial stripe measures 9 mm in thickness which is considered abnormal in a postmenopausal female. No adnexal mass. Other: No significant free fluid or fluid collections. Musculoskeletal: No acute or significant osseous findings. IMPRESSION: 1. Progressive distension of the small bowel measure up to 2.9 cm with a few air-fluid levels. The distal small bowel loops have a normal caliber up to the ileocecal valve. Although nonspecific these findings may reflect developing small bowel ileus or early small-bowel obstruction. Consider serial follow-up radiographs of the abdomen. 2. Enlarging mass within the left hilar region is identified. The visualized portions in the left lower lobe measures 5.9 x 5.0 cm. On the PET-CT from 05/27/23 the perihilar portion measured up to 5.7 x 3.5 cm. 3. 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 On 06/11/2023 10.0 by 7.4 by 13.1 cm. 4. New small right pleural effusion. 5. Endometrial stripe measures 9 mm in thickness which is considered abnormal in a postmenopausal female. Consider further evaluation with pelvic ultrasound. 6.  Aortic Atherosclerosis (ICD10-I70.0). Electronically Signed   By: Signa Kell M.D.   On: 06/20/2023 17:50   MR Brain W Wo Contrast  Result Date:  06/16/2023 CLINICAL DATA:  Provided history: Malignant neoplasm of unspecified part of unspecified bronchus or lung. Non-small cell lung cancer, staging. EXAM: MRI HEAD WITHOUT AND WITH CONTRAST TECHNIQUE: Multiplanar, multiecho pulse sequences of the brain and surrounding structures were obtained without and with intravenous contrast. CONTRAST:  10mL GADAVIST GADOBUTROL 1 MMOL/ML IV SOLN COMPARISON:  Head CT 04/09/2016. FINDINGS: Brain: Cerebral volume is normal. 2 mm focus of enhancement along the right central sulcus which is indeterminate for a small cortical metastasis versus vascular enhancement (series 16, image 34). Small T2 hyperintense foci within the left thalamus and left lentiform nucleus, which may reflect prominent perivascular spaces or chronic lacunar infarcts. There are a few small foci of T2 FLAIR hyperintense signal abnormality within the cerebral white matter, nonspecific but compatible with minimal changes of chronic small vessel ischemia. There is no acute infarct. No chronic intracranial blood products. No extra-axial fluid collection. No midline shift. Vascular: Maintained flow voids within the proximal large arterial vessels. Small developmental venous anomaly within the anterior right frontal lobe (anatomic variant). Skull and upper cervical spine: No focal suspicious marrow lesion. Susceptibility artifact arising from ACDF hardware. Sinuses/Orbits: No mass or acute finding within the imaged orbits. No significant paranasal sinus disease. Impression #1 will be called to the ordering clinician or representative by the Radiologist Assistant, and communication documented in the PACS or [employer]. IMPRESSION: 1. 2 mm focus of enhancement along the right central sulcus, which is indeterminate for a small cortical metastasis versus vascular enhancement. A short-interval follow-up brain MRI (with and without contrast) is recommended in 6-8 weeks. 2. Small T2 hyperintense foci within the  deep gray nuclei on the left, which may reflect prominent perivascular spaces or chronic lacunar infarcts. 3. Minimal chronic small vessel ischemic changes within the cerebral white matter. Electronically Signed   By: Jackey Loge D.O.   On: 06/16/2023 17:40

## 2023-07-13 NOTE — Patient Instructions (Signed)
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 You will be treated in the clinic every 3 weeks with a combination of chemotherapy and immunotherapy drugs. The two chemotherapy drugs you will receive are carboplatin and pemetrexed (Alimta). The two immunotherapy drugs are Opdivo and Yervoy. You will receive all four drugs for the first treatment. The second treatment you will receive 3 drugs (carboplatin, pemetrexed, and Opdivo). After 2 cycles of treatment, the chemotherapy drugs are no longer given, and you will come to the clinic every 3 weeks to receive the immunotherapy drugs. The Opdivo is given every 3 weeks. The Arthur Holms is given every 6 weeks. We will continue maintenance therapy with the immunotherapy drugs as long as they are helping to control the cancer. The intent of treatment is to control and shrink this cancer, prevent it from spreading further, and to alleviate any symptoms you may be having related to the cancer. Since this is a Stage IV disease, it cannot be cured. But it is treatable. You will see the doctor regularly throughout treatment. We will obtain blood work from you prior to every treatment and monitor your results to make sure it is safe to give your treatment. The doctor monitors your response to treatment by the way you are feeling, your blood work, and by obtaining scans periodically. There will be wait times while you are here for treatment.  It will take about 30 minutes to 1 hour for your lab work to result.  Then there will be wait times while pharmacy mixes your medications.    Medications you will receive in the clinic prior to your chemotherapy medications:  Aloxi:  ALOXI is used in adults to help prevent nausea and vomiting that happens with certain chemotherapy drugs.  Aloxi is a long acting medication, and will remain in your system for about two days.   Emend:  This is an anti-nausea  medication that is used with Aloxi to help prevent nausea and vomiting caused by chemotherapy.  Dexamethasone:  This is a steroid given prior to chemotherapy to help prevent allergic reactions; it may also help prevent and control nausea and diarrhea.    Pepcid:  This medication is a histamine blocker that helps prevent and allergic reaction to your chemotherapy.    Cetirizine:  This is a histamine blocker (different from the Pepcid) that helps prevent allergic/infusion reactions to your chemotherapy. This medication may cause dizziness/drowsiness.     You must take folic acid every day by mouth beginning 7 days before your first dose of Alimta. You must keep taking folic acid every day during the time you are being treated with alimta, and for every day for 21 days after you receive your last alimta dose.    You will get B12 injections while you are on alimta.  The first one about 1 week prior to starting treatment and then about every 9 weeks during treatment.    Carboplatin (Generic Name) Other Names: Paraplatin, CBDCA  About This Drug Carboplatin is a drug used to treat cancer. This drug is given in the vein (IV).  Takes 30 minutes to infuse.   Possible Side Effects (More Common)  Nausea and throwing up (vomiting). These symptoms may happen within a few hours after your treatment and may last up to 24 hours. Medicines are available to stop or lessen these side effects.   Bone marrow depression. This is a decrease in the number of  white blood cells, red blood cells, and platelets. This may raise your risk of infection, make you tired and weak (fatigue), and raise your risk of bleeding.   Soreness of the mouth and throat. You may have red areas, white patches, or sores that hurt.   This drug may affect how your kidneys work. Your kidney function will be checked as needed.   Electrolyte changes. Your blood will be checked for electrolyte changes as needed.  Possible Side Effects  (Less Common)   Hair loss. Some patients lose their hair on the scalp and body. You may notice your hair thinning seven to 14 days after getting this drug.   Effects on the nerves are called peripheral neuropathy. You may feel numbness, tingling, or pain in your hands and feet. It may be hard for you to button your clothes, open jars, or walk as usual. The effect on the nerves may get worse with more doses of the drug. These effects get better in some people after the drug is stopped but it does not get better in all people.   Loose bowel movements (diarrhea) that may last for several days   Decreased hearing or ringing in the ears   Changes in the way food and drinks taste   Changes in liver function. Your liver function will be checked as needed  Allergic Reactions Serious allergic reactions including anaphylaxis are rare. While you are getting this drug in your vein (IV), tell your nurse right away if you have any of these symptoms of an allergic reaction:   Trouble catching your breath   Feeling like your tongue or throat are swelling   Feeling your heart beat quickly or in a not normal way (palpitations)   Feeling dizzy or lightheaded   Flushing, itching, rash, and/or hives  Treating Side Effects   Drink 6-8 cups of fluids each day unless your doctor has told you to limit your fluid intake due to some other health problem. A cup is 8 ounces of fluid. If you throw up or have loose bowel movements, you should drink more fluids so that you do not become dehydrated (lack water in the body from losing too much fluid).   Mouth care is very important. Your mouth care should consist of routine, gentle cleaning of your teeth or dentures and rinsing your mouth with a mixture of 1/2 teaspoon of salt in 8 ounces of water or  teaspoon of baking soda in 8 ounces of water. This should be done at least after each meal and at bedtime.   If you have mouth sores, avoid mouthwash that has  alcohol. Avoid alcohol and smoking because they can bother your mouth and throat.   If you have numbness and tingling in your hands and feet, be careful when cooking, walking, and handling sharp objects and hot liquids.   Talk with your nurse about getting a wig before you lose your hair. Also, call the [employer] at 800-ACS-2345 to find out information about the "Look Good, Feel Better" program close to where you live. It is a free program where women getting chemotherapy can learn about wigs, turbans and scarves as well as makeup techniques and skin and nail care.  Food and Drug Interactions There are no known interactions of carboplatin with food. This drug may interact with other medicines. Tell your doctor and pharmacist about all the medicines and dietary supplements (vitamins, minerals, herbs and others) that you are taking at this time. The  safety and use of dietary supplements and alternative diets are often not known. Using these might affect your cancer or interfere with your treatment. Until more is known, you should not use dietary supplements or alternative diets without your cancer doctor's help.  When to Call the Doctor Call your doctor or nurse right away if you have any of these symptoms:   Fever of 100.4 F (38 C) or above; chills   Bleeding or bruising that is not normal   Wheezing or trouble breathing   Nausea that stops you from eating or drinking   Vomiting   Rash or itching   Loose bowel movements (diarrhea) more than four times a day or diarrhea with weakness or feeling lightheaded  Call your doctor or nurse as soon as possible if any of these symptoms happen:   Numbness, tingling, decreased feeling or weakness in fingers, toes, arms, or legs   Change in hearing, ringing in the ears   Blurred vision or other changes in eyesight   Decreased urine   Yellowing of skin or eyes  Sexual Problems and Reproductive Concerns Sexual problems and  reproduction concerns may happen. In both men and women, this drug may affect your ability to have children. This cannot be determined before your treatment. Talk with your doctor or nurse if you plan to have children. Ask for information on sperm or egg banking. In men, this drug may interfere with your ability to make sperm, but it should not change your ability to have sexual relations. In women, menstrual bleeding may become irregular or stop while you are getting this drug. Do not assume that you cannot become pregnant if you do not have a menstrual period. Women may go through signs of menopause (change of life) like vaginal dryness or itching. Vaginal lubricants can be used to lessen vaginal dryness, itching, and pain during sexual relations. Genetic counseling is available for you to talk about the effects of this drug therapy on future pregnancies. Also, a genetic counselor can look at the possible risk of problems in the unborn baby due to this medicine if an exposure happens during pregnancy.   Pregnancy warning: This drug may have harmful effects on the unborn child, so effective methods of birth control should be used during your cancer treatment.   Breast feeding warning: It is not known if this drug passes into breast milk. For this reason, women should talk to their doctor about the risks and benefits of breast feeding during treatment with this drug because this drug may enter the breast milk and badly harm a breast feeding baby.   Pemetrexed (Generic Name) Other Names: ALIMTA  About This Drug Pemetrexed is used to treat cancer. It is given in the vein (IV).  Takes 10 minutes to infuse.   Possible Side Effects (More Common)   Bone marrow depression. This is a decrease in the number of white blood cells, red blood cells, and platelets. This may raise your risk of infection, make you feel tired and weak (fatigue), and raise your risk of bleeding.   Fatigue   Soreness of the mouth  and throat. You may have red areas, white patches, or sores that hurt.  Possible Side Effects (less common)   Trouble breathing or feeling short of breath   Nausea and vomiting   Skin rash  Treating Side Effects   Ask your doctor or nurse about medicine that is available to help stop or lessen nausea and throwing up.  If you get a rash, do not put anything on it unless your doctor or nurse says you may. Keep the area around the rash clean and dry.   Mouth care is very important. Your mouth care should consist of routine, gentle cleaning of your teeth or dentures and rinsing your mouth with a mixture of 1/2 teaspoon of salt in 8 ounces of water or  teaspoon of baking soda in 8 ounces of water. This should be done at least after each meal and at bedtime.   If you have mouth sores, avoid mouthwash that has alcohol. Avoid alcohol and smoking because they can bother your mouth and throat.  Food and Drug Interactions There are no known interactions of this medicine with food. Tell your doctor if you are taking ibuprofen. Pemetrexed may interact with other medicines. Tell your doctor and pharmacist about all the medicines and dietary supplements (vitamins, minerals, herbs and others) that you are taking at this time. The safety and use of dietary supplements and alternative diets are often not known. Using these might affect your cancer or interfere with your treatment. Until more is known, you should not use dietary supplements or alternative diets without your cancer doctor's help.  When to Call the Doctor Call your doctor or nurse right away if you have any of these symptoms:   Temperature of 100.4 F (38 C) or above   Chills   Easy bruising or bleeding   Trouble breathing   Chest pain   Nausea that stops you from eating or drinking   Throwing up/vomiting  Call your doctor or nurse as soon as possible if you have any of these symptoms:   Rash that does not go away with  prescribed medicine   Nausea or vomiting that does not go away with prescribed medicine   Extreme fatigue that interferes with normal activities  Reproduction Concerns  Pregnancy warning: This drug may have harmful effects on the unborn child, so effective methods of birth control should be used during your cancer treatment.   Genetic counseling is available for you to talk about the effects of this drug therapy on future pregnancies. Also, a genetic counselor can look at the possible risk of problems in the unborn baby due to this medicine if an exposure happens during pregnancy.   Breast feeding warning: It is not known if this drug passes into breast milk. For this reason, women should talk to their doctor about the risks and benefits of breast feeding during treatment with this drug because this drug may enter the breast milk and badly harm a breast feeding baby.   Nivolumab (Opdivo)   About This Drug   Nivolumab is used to treat cancer. It is given in the vein (IV).   It helps your immune system work properly to target and kill cancer cells. It will take 30 minutes to infuse.     These are some of the most common or important side effects:    Immune Reactions    This medication stimulates your immune system. Your immune system can attack normal organs and tissues in your body, leading to serious or life threatening complications. It is important to notify your healthcare provider right away if you develop any of the following symptoms:    Diarrhea / Intestinal problems (colitis, inflammation of the bowel): Abdominal pain, diarrhea, cramping, mucus or blood in the stool, dark or tar-like stools, fever. Diarrhea means different things to different people. Any increase in your normal  bowel patterns can be defined as diarrhea and should be reported to your healthcare team.   Skin reactions: Report rash, with or without itching (pruritis), sores in your mouth, blistering or peeling skin,  as these can become severe and require treatment with corticosteroids.   Lung problems (pneumonitis, inflammation of the lung): New or worsening cough, shortness of breath, trouble breathing, or chest pain.   Liver problems (hepatitis, inflammation of the liver): Yellowing of the skin or eyes, your urine appears dark or brown, pain in your abdomen, bleeding or bruising more easily than normal, or severe nausea and vomiting.    Brain and/or nerve problems: Report any headache, drooping of eyelids, double vision, trouble swallowing, weakness of arms, legs or face, or numbness or tingling in the hands or feet to your healthcare team.   Hormone abnormalities: Immune reactions can affect the pituitary, thyroid, pancreas and adrenal glands, resulting in inflammation of these glands, which can affect their production of certain hormones. Some hormone levels can be monitored with blood work. It is important that you report any changes in how you are feeling to your care team. Symptoms of these hormonal changes can include: headaches, nausea, vomiting, constipation, rapid heart rate, increased sweating, extreme fatigue, weakness, changes in your voice, changes in memory and concentration, increased hunger or thirst, increased urination, weight gain, hair loss, dizziness, feeling cold all the time, and changes in mood or behavior (including irritability, forgetfulness and decreased sex drive).     Eye problems: Report any changes in vision, blurry or double vision, and eye pain or redness to your healthcare team.  Kidney problems (kidney inflammation or failure): Decreased urine output, blood in the urine, swelling in the ankles, loss of appetite.   Fatigue:  Fatigue is very common during cancer treatment and is an overwhelming feeling of exhaustion that is not usually relieved by rest. While on cancer treatment, and for a period after, you may need to adjust your schedule to manage fatigue. Plan times to rest  during the day and conserve energy for more important activities. Exercise can help combat fatigue; a simple daily walk with a friend can help.  Talk to your healthcare team for helpful tips on dealing with this side effect.   Allergic Reactions:  In some cases, patients can have an allergic reaction to this medication. Signs of a reaction can include: shortness of breath or difficulty breathing, chest pain, rash, flushing or itching or a decrease in blood pressure. If you notice any changes in how you feel during the infusion, let your nurse know immediately. The infusion will be slowed or stopped if this occurs.    Less common, but important side effects can include:   Allogenic Stem Cell Transplant Reactions: Patients who receive this medication before or after having an allogenic stem cell transplant can be at an increased risk of graft vs. host disease, veno-occlusive disease and fever syndrome. You providers will monitor you closely for these side effects.    Reproductive Concerns  Exposure of an unborn child to this medication could cause birth defects, so you should not become pregnant or father a child while on this medication. Even if your menstrual cycle stops or you believe you are not producing sperm, effective birth control is necessary during treatment and for 5 months after stopping treatment. You should not breastfeed while taking this medication or for five months after the end of treatment.    Important Information    This drug may be present  in the saliva, tears, sweat, urine, stool, vomit, semen, and vaginal secretions. Talk to your doctor and/or your nurse about the necessary precautions to take during this time.   Treating Side Effects    To decrease infection, wash your hands regularly    Avoid close contact with people who have a cold, the flu, or other infections.    Take your temperature as your doctor or nurse tells you, and whenever you feel like you may have a  fever    To help decrease bleeding, use a soft toothbrush. Check with your nurse before using dental floss.    Be very careful when using knives or tools    Use an electric shaver instead of a razor    Ask your doctor or nurse about medicines that are available to help stop or lessen constipation, diarrhea and/or nausea.    Drink plenty of fluids (a minimum of eight glasses per day is recommended).    If you are not able to move your bowels, check with your doctor or nurse before you use any enemas, laxatives, or suppositories    To help with nausea and vomiting, eat small, frequent meals instead of three large meals a day. Choose foods and drinks that are at room temperature. Ask your nurse or doctor about other helpful tips and medicine that is available to help or stop lessen these symptoms.    If you get diarrhea, eat low-fiber foods that are high in protein and calories and avoid foods that can irritate your digestive tracts or lead to cramping. Ask your nurse or doctor about medicine that can lessen or stop your diarrhea.    To help with decreased appetite, eat small, frequent meals    Eat high caloric food such as pudding, ice cream, yogurt and milkshakes.    Manage tiredness by pacing your activities for the day. Be sure to include periods of rest between energy-draining activities    Keeping your pain under control is important to your wellbeing. Please tell your doctor or nurse if you are experiencing pain.    If you have diabetes, keep good control of your blood sugar level. Tell your nurse or your doctor if your glucose levels are higher or lower than normal    If you get a rash do not put anything on it unless your doctor or nurse says you may. Keep the area around the rash clean and dry. Ask your doctor for medicine if your rash bothers you.    Infusion reactions may occur after your infusion. If this happens, call 911 for emergency care.   Food and Drug Interactions     There are no known interactions of nivolumab with food or other medications.    Tell your doctor and pharmacist about all the medicines and dietary supplements (vitamins, minerals, herbs and others) that you are taking at this time. The safety and use of dietary supplements and alternative diets are often not known. Using these might affect your cancer or interfere with your treatment. Until more is known, you should not use dietary supplements or alternative diets without your cancer doctor's help.   When to Call the Doctor   Call your doctor or nurse if you have any of these symptoms and/or any new or unusual symptoms:    Fever of 100.4 F (38 C) or higher    Chills    Easy bleeding or bruising    Wheezing or trouble breathing    Dry cough, or  cough with yellow, green or bloody mucus    Confusion and/or agitation    Hallucinations    Trouble understanding or speaking    Blurry vision or changes in your eyesight    Numbness or lack of strength to your arms, legs, face, or body    Feeling dizzy or lightheaded    Loose bowel movements (diarrhea) more than 4 times a day or diarrhea with weakness or lightheadedness    Nausea that stops you from eating or drinking, and/or that is not relieved by prescribed medicines    Lasting loss of appetite or rapid weight loss of five pounds in a week    No bowel movement for 3 days or you feel uncomfortable    Bad abdominal pain, especially in upper right area    Fatigue or extreme weakness that interferes with normal activities    Decreased urine    Unusual thirst or passing urine often    Rash that is not relieved by prescribed medicines    Rash or itching    Flu-like symptoms: fever, headache, muscle and joint aches, and fatigue (low energy, feeling weak)    Signs of liver problems: dark urine, pale bowel movements, bad stomach pain, feeling very tired and weak, unusual itching, or yellowing of the eyes or skin.    Signs  of infusion reaction: fever or shaking chills, flushing, facial swelling, feeling dizzy, headache, trouble breathing, rash, itching, chest tightness, or chest pain.    If you think you may be pregnant   Reproduction Warnings    Pregnancy warning: This drug can have harmful effects on the unborn baby. Women of child bearing potential should use effective methods of birth control during your cancer treatment and for at least 5 months after treatment. Let your doctor know right away if you think you may be pregnant.    Breastfeeding warning: It is not known if this drug passes into breast milk. For this reason, women should not breast feed during treatment because this drug could enter the breast milk and cause harm to a breast feeding baby.    Fertility warning: Human fertility studies have not been done with this drug. Talk with your doctor or nurse if you plan to have children. Ask for information on sperm or egg banking.     [email or web address]        Generic name : Ipilimumab    How Yervoy  Is Given:  Arthur Holms is given as an intravenous injection through a vein (IV). It helps your immune system work properly to target and kill cancer cells.  It will take 30 minutes to infuse.    Immune Reactions  This medication stimulates your immune system. Your immune system can attack normal organs and tissues in your body, leading to serious or life threatening complications. It is important to notify your healthcare provider right away if you develop any of the following symptoms:    Diarrhea / Intestinal problems (colitis, inflammation of the bowel): Abdominal pain, diarrhea, cramping, mucus or blood in the stool, dark or tar-like stools, fever. Diarrhea means different things to different people. Any increase in your normal bowel patterns can be defined as diarrhea and should be reported to your healthcare team.   Bowel obstruction or perforation: Abdominal pain, fever, constipation, bloating and cramping. Any  decrease in your normal bowel patterns should be reported.    Skin reactions: Report rash, with or without itching (pruritis), sores in your mouth, blistering or peeling skin, as these  can become severe and require treatment with corticosteroids.   Liver problems (hepatitis, inflammation of the liver): Yellowing of the skin or eyes, your urine appears dark or brown, pain in your abdomen, bleeding or bruising more easily than normal, or severe nausea and vomiting.   Brain and/or nerve problems: Report any headache, drooping of eyelids, double vision, trouble swallowing, weakness of arms, legs or face, or numbness or tingling in the hands or feet to your healthcare team.   Eye problems: Report any changes in vision, blurry or double vision, and eye pain or redness.    Lung problems (pneumonitis, inflammation of the lung): New or worsening cough, shortness of breath, trouble breathing, or chest pain.   Kidney problems: (kidney inflammation or failure):  Decreased urine output, blood in the urine, swelling in the ankles, loss of appetite.    Hormone abnormalities: Immune reactions can affect the pituitary, thyroid, pancreas and adrenal glands, resulting in inflammation of these glands, which can affect their production of certain hormones. Some hormone levels can be monitored with blood work. It is important that you report any changes in how you are feeling to your care team. Symptoms of these hormonal changes can include: headaches, nausea, vomiting, constipation, rapid heart rate, increased sweating, extreme fatigue, weakness, changes in your voice, changes in memory and concentration, increased hunger or thirst, increased urination, weight gain, hair loss, dizziness, feeling cold all the time, and changes in mood or behavior (including irritability, forgetfulness and decreased sex drive).    Infusion-Related Side Effects: The infusion can cause a reaction that may lead to chills, fever, low blood  pressure, dizziness, feeling like you are going to pass out and difficulty breathing. Let your nurse know right away if you are experiencing any changes in how you are feeling.    Fatigue  Fatigue is very common during cancer treatment and is an overwhelming feeling of exhaustion that is not usually relieved by rest. While on cancer treatment, and for a period after, you may need to adjust your schedule to manage fatigue. Plan times to rest during the day and conserve energy for more important activities. Exercise can help combat fatigue; a simple daily walk with a friend can help. Talk to your healthcare team for helpful tips on dealing with this side effect.    Reproductive Concerns  Exposure of an unborn child to this medication could cause birth defects, so you should not become pregnant or father a child while on this medication. Effective birth control is necessary during treatment and for 3 months after the last dose. Even if your menstrual cycle stops or you believe you are not producing sperm, you could still be fertile and conceive. You should not breastfeed while receiving this medication and for 3 months after the last dose.      When to Call the Doctor   Call your doctor or nurse if you have any of these symptoms and/or any new or unusual symptoms:    Fever of 100.4 F (38 C) or higher    Chills    Easy bleeding or bruising    Wheezing or trouble breathing    Dry cough, or cough with yellow, green or bloody mucus    Confusion and/or agitation    Hallucinations    Trouble understanding or speaking    Blurry vision or changes in your eyesight    Numbness or lack of strength to your arms, legs, face, or body    Feeling dizzy or lightheaded  Loose bowel movements (diarrhea) more than 4 times a day or diarrhea with weakness or lightheadedness    Nausea that stops you from eating or drinking, and/or that is not relieved by prescribed medicines    Lasting loss of  appetite or rapid weight loss of five pounds in a week    No bowel movement for 3 days or you feel uncomfortable    Bad abdominal pain, especially in upper right area    Fatigue or extreme weakness that interferes with normal activities    Decreased urine    Unusual thirst or passing urine often    Rash that is not relieved by prescribed medicines    Rash or itching    Flu-like symptoms: fever, headache, muscle and joint aches, and fatigue (low energy, feeling weak)    Signs of liver problems: dark urine, pale bowel movements, bad stomach pain, feeling very tired and weak, unusual itching, or yellowing of the eyes or skin.    Signs of infusion reaction: fever or shaking chills, flushing, facial swelling, feeling dizzy, headache, trouble breathing, rash, itching, chest tightness, or chest pain.   Precautions:  Before starting Yervoy treatment, make sure you tell your doctor about any other medications you are taking (including prescription, over-the-counter, vitamins, herbal remedies, etc.). Do not receive any kind of immunization or vaccination without your doctor's approval while taking Yervoy. Inform your health care professional if you are pregnant or may be pregnant prior to starting this treatment. Pregnancy category C [employer] Holms may be hazardous to the fetus. Women who are pregnant or become pregnant must be advised of the potential hazard to the fetus.) For both men and women: Do not conceive a child (get pregnant) while taking Yervoy. Barrier methods of contraception, such as condoms, are recommended. Discuss with your doctor when you may safely become pregnant or conceive a child after therapy. Do not breast feed while taking this medication.    SELF CARE ACTIVITIES WHILE ON CHEMOTHERAPY/IMMUNOTHERAPY:  Hydration Increase your fluid intake and drink at least 64 ounces (2 liters) of water/decaffeinated beverages per day after treatment. You can still have your cup of coffee or soda  but these beverages do not count as part of the 64 ounces that you need to drink daily. Limit alcohol intake.  Medications Continue taking your normal prescription medication as prescribed.  If you start any new herbal or new supplements please let us know first to make sure it is safe.  Mouth Care Have teeth cleaned professionally before starting treatment. Keep dentures and partial plates clean. Use soft toothbrush and do not use mouthwashes that contain alcohol. Biotene is a good mouthwash that is available at most pharmacies or may be ordered by calling [phone]. Use warm salt water gargles (1 teaspoon salt per 1 quart warm water) before and after meals and at bedtime. If you are still having problems with your mouth or sores in your mouth please call the clinic. If you need dental work, please let the doctor know before you go for your appointment so that we can coordinate the best possible time for you in regards to your chemo regimen. You need to also let your dentist know that you are actively taking chemo. We may need to do labs prior to your dental appointment.  Skin Care Always use sunscreen that has not expired and with SPF (Sun Protection Factor) of 50 or higher. Wear hats to protect your head from the sun. Remember to use sunscreen on your hands,  ears, face, & feet.  Use good moisturizing lotions such as udder cream, eucerin, or even Vaseline. Some chemotherapies can cause dry skin, color changes in your skin and nails.    Avoid long, hot showers or baths. Use gentle, fragrance-free soaps and laundry detergent. Use moisturizers, preferably creams or ointments rather than lotions because the thicker consistency is better at preventing skin dehydration. Apply the cream or ointment within 15 minutes of showering. Reapply moisturizer at night, and moisturize your hands every time after you wash them.   Infection Prevention Please wash your hands for at least 30 seconds using warm  soapy water. Handwashing is the #1 way to prevent the spread of germs. Stay away from sick people or people who are getting over a cold. If you develop respiratory systems such as green/yellow mucus production or productive cough or persistent cough let us know and we will see if you need an antibiotic. It is a good idea to keep a pair of gloves on when going into grocery stores/[employer] to decrease your risk of coming into contact with germs on the carts, etc. Carry alcohol hand gel with you at all times and use it frequently if out in public. If your temperature reaches 100.5 or higher please call the clinic and let us know.  If it is after hours or on the weekend please go to the ER if your temperature is over 100.4.  Please have your own personal thermometer at home to use.    Sex and bodily fluids If you are going to have sex, a condom must be used to protect the person that isn't taking immunotherapy. For a few days after treatment, immunotherapy can be excreted through your bodily fluids.  When using the toilet please close the lid and flush the toilet twice.  Do this for a few day after you have had immunotherapy.   Contraception It is not known for sure whether or not immunotherapy drugs can be passed on through semen or secretions from the vagina. Because of this some doctors advise people to use a barrier method if you have sex during treatment. This applies to vaginal, anal or oral sex.  Generally, doctors advise a barrier method only for the time you are actually having the treatment and for about a week after your treatment.  Advice like this can be worrying, but this does not mean that you have to avoid being intimate with your partner. You can still have close contact with your partner and continue to enjoy sex.  Animals If you have cats or birds we ask that you not change the litter or change the cage.  Please have someone else do this for you while you are on immunotherapy.   Food  Safety During and After Cancer Treatment Food safety is important for people both during and after cancer treatment. Cancer and cancer treatments, such as chemotherapy, radiation therapy, and stem cell/bone marrow transplantation, often weaken the immune system. This makes it harder for your body to protect itself from foodborne illness, also called food poisoning. Foodborne illness is caused by eating food that contains harmful bacteria, parasites, or viruses.  Foods to avoid Some foods have a higher risk of becoming tainted with bacteria. These include: Unwashed fresh fruit and vegetables, especially leafy vegetables that can hide dirt and other contaminants Raw sprouts, such as alfalfa sprouts Raw or undercooked beef, especially ground beef, or other raw or undercooked meat and poultry Fatty, fried, or spicy foods immediately before or  after treatment.  These can sit heavy on your stomach and make you feel nauseous. Raw or undercooked shellfish, such as oysters. Sushi and sashimi, which often contain raw fish.  Unpasteurized beverages, such as unpasteurized fruit juices, raw milk, raw yogurt, or cider Undercooked eggs, such as soft boiled, over easy, and poached; raw, unpasteurized eggs; or foods made with raw egg, such as homemade raw cookie dough and homemade mayonnaise  Simple steps for food safety  Shop smart. Do not buy food stored or displayed in an unclean area. Do not buy bruised or damaged fruits or vegetables. Do not buy cans that have cracks, dents, or bulges. Pick up foods that can spoil at the end of your shopping trip and store them in a cooler on the way home.  Prepare and clean up foods carefully. Rinse all fresh fruits and vegetables under running water, and dry them with a clean towel or paper towel. Clean the top of cans before opening them. After preparing food, wash your hands for 20 seconds with hot water and soap. Pay special attention to areas between fingers and  under nails. Clean your utensils and dishes with hot water and soap. Disinfect your kitchen and cutting boards using 1 teaspoon of liquid, unscented bleach mixed into 1 quart of water.    Dispose of old food. Eat canned and packaged food before its expiration date (the "use by" or "best before" date). Consume refrigerated leftovers within 3 to 4 days. After that time, throw out the food. Even if the food does not smell or look spoiled, it still may be unsafe. Some bacteria, such as Listeria, can grow even on foods stored in the refrigerator if they are kept for too long.  Take precautions when eating out. At restaurants, avoid buffets and salad bars where food sits out for a long time and comes in contact with many people. Food can become contaminated when someone with a virus, often a norovirus, or another "bug" handles it. Put any leftover food in a "to-go" container yourself, rather than having the server do it. And, refrigerate leftovers as soon as you get home. Choose restaurants that are clean and that are willing to prepare your food as you order it cooked.    SYMPTOMS TO REPORT AS SOON AS POSSIBLE AFTER TREATMENT:  FEVER GREATER THAN 100.4 F CHILLS WITH OR WITHOUT FEVER NAUSEA AND VOMITING THAT IS NOT CONTROLLED WITH YOUR NAUSEA MEDICATION UNUSUAL SHORTNESS OF BREATH UNUSUAL BRUISING OR BLEEDING TENDERNESS IN MOUTH AND THROAT WITH OR WITHOUT PRESENCE OF ULCERS URINARY PROBLEMS BOWEL PROBLEMS UNUSUAL RASH     Wear comfortable clothing and clothing appropriate for easy access to any Portacath or PICC line. Let us know if there is anything that we can do to make your therapy better!   What to do if you need assistance after hours or on the weekends: CALL [phone].  HOLD on the line, do not hang up.  You will hear multiple messages but at the end you will be connected with a nurse triage line.  They will contact the doctor if necessary.  Most of the time they will be able to  assist you.  Do not call the hospital operator.    I have been informed and understand all of the instructions given to me and have received a copy. I have been instructed to call the clinic [phone] or my family physician as soon as possible for continued medical care, if indicated. I do not have  any more questions at this time but understand that I may call the Cancer Center or the Patient Navigator at [phone] during office hours should I have questions or need assistance in obtaining follow-up care.

## 2023-07-14 ENCOUNTER — Inpatient Hospital Stay

## 2023-07-14 ENCOUNTER — Inpatient Hospital Stay (HOSPITAL_BASED_OUTPATIENT_CLINIC_OR_DEPARTMENT_OTHER): Admitting: Hematology

## 2023-07-14 VITALS — BP 119/66 | HR 95 | Temp 98.8°F | Resp 18

## 2023-07-14 VITALS — BP 109/74 | HR 116 | Temp 98.7°F | Resp 20 | Wt 243.9 lb

## 2023-07-14 DIAGNOSIS — C7971 Secondary malignant neoplasm of right adrenal gland: Secondary | ICD-10-CM | POA: Diagnosis present

## 2023-07-14 DIAGNOSIS — C3490 Malignant neoplasm of unspecified part of unspecified bronchus or lung: Secondary | ICD-10-CM

## 2023-07-14 DIAGNOSIS — Z5111 Encounter for antineoplastic chemotherapy: Secondary | ICD-10-CM | POA: Diagnosis present

## 2023-07-14 DIAGNOSIS — Z87891 Personal history of nicotine dependence: Secondary | ICD-10-CM | POA: Diagnosis not present

## 2023-07-14 DIAGNOSIS — C3492 Malignant neoplasm of unspecified part of left bronchus or lung: Secondary | ICD-10-CM

## 2023-07-14 DIAGNOSIS — Z95828 Presence of other vascular implants and grafts: Secondary | ICD-10-CM

## 2023-07-14 DIAGNOSIS — Z79899 Other long term (current) drug therapy: Secondary | ICD-10-CM | POA: Diagnosis not present

## 2023-07-14 LAB — COMPREHENSIVE METABOLIC PANEL
ALT: 14 U/L (ref 0–44)
AST: 14 U/L — ABNORMAL LOW (ref 15–41)
Albumin: 2.1 g/dL — ABNORMAL LOW (ref 3.5–5.0)
Alkaline Phosphatase: 139 U/L — ABNORMAL HIGH (ref 38–126)
Anion gap: 9 (ref 5–15)
BUN: 7 mg/dL — ABNORMAL LOW (ref 8–23)
CO2: 24 mmol/L (ref 22–32)
Calcium: 7.8 mg/dL — ABNORMAL LOW (ref 8.9–10.3)
Chloride: 97 mmol/L — ABNORMAL LOW (ref 98–111)
Creatinine, Ser: 0.52 mg/dL (ref 0.44–1.00)
GFR, Estimated: >60 mL/min (ref >60)
Glucose, Bld: 115 mg/dL — ABNORMAL HIGH (ref 70–99)
Potassium: 3.3 mmol/L — ABNORMAL LOW (ref 3.5–5.1)
Sodium: 130 mmol/L — ABNORMAL LOW (ref 135–145)
Total Bilirubin: 0.5 mg/dL (ref 0.3–1.2)
Total Protein: 5.8 g/dL — ABNORMAL LOW (ref 6.5–8.1)

## 2023-07-14 LAB — CBC WITH DIFFERENTIAL/PLATELET
Abs Immature Granulocytes: 0.08 10*3/uL — ABNORMAL HIGH (ref 0.00–0.07)
Basophils Absolute: 0 10*3/uL (ref 0.0–0.1)
Basophils Relative: 0 %
Eosinophils Absolute: 0 10*3/uL (ref 0.0–0.5)
Eosinophils Relative: 0 %
HCT: 33.2 % — ABNORMAL LOW (ref 36.0–46.0)
Hemoglobin: 10.2 g/dL — ABNORMAL LOW (ref 12.0–15.0)
Immature Granulocytes: 1 %
Lymphocytes Relative: 8 %
Lymphs Abs: 0.9 10*3/uL (ref 0.7–4.0)
MCH: 25.2 pg — ABNORMAL LOW (ref 26.0–34.0)
MCHC: 30.7 g/dL (ref 30.0–36.0)
MCV: 82.2 fL (ref 80.0–100.0)
Monocytes Absolute: 0.9 10*3/uL (ref 0.1–1.0)
Monocytes Relative: 9 %
Neutro Abs: 8.4 10*3/uL — ABNORMAL HIGH (ref 1.7–7.7)
Neutrophils Relative %: 82 %
Platelets: 395 10*3/uL (ref 150–400)
RBC: 4.04 MIL/uL (ref 3.87–5.11)
RDW: 17.2 % — ABNORMAL HIGH (ref 11.5–15.5)
WBC: 10.3 10*3/uL (ref 4.0–10.5)
nRBC: 0 % (ref 0.0–0.2)

## 2023-07-14 LAB — MAGNESIUM: Magnesium: 1.9 mg/dL (ref 1.7–2.4)

## 2023-07-14 MED ORDER — SODIUM CHLORIDE 0.9 % IV SOLN
10.0000 mg | Freq: Once | INTRAVENOUS | Status: AC
  Administered 2023-07-14: 10 mg via INTRAVENOUS
  Filled 2023-07-14: qty 10

## 2023-07-14 MED ORDER — PALONOSETRON HCL INJECTION 0.25 MG/5ML
0.2500 mg | Freq: Once | INTRAVENOUS | Status: AC
  Administered 2023-07-14: 0.25 mg via INTRAVENOUS
  Filled 2023-07-14: qty 5

## 2023-07-14 MED ORDER — SODIUM CHLORIDE 0.9 % IV SOLN
150.0000 mg | Freq: Once | INTRAVENOUS | Status: AC
  Administered 2023-07-14: 150 mg via INTRAVENOUS
  Filled 2023-07-14: qty 150

## 2023-07-14 MED ORDER — SODIUM CHLORIDE 0.9 % IV SOLN
360.0000 mg | Freq: Once | INTRAVENOUS | Status: AC
  Administered 2023-07-14: 360 mg via INTRAVENOUS
  Filled 2023-07-14: qty 24

## 2023-07-14 MED ORDER — HEPARIN SOD (PORK) LOCK FLUSH 100 UNIT/ML IV SOLN
500.0000 [IU] | Freq: Once | INTRAVENOUS | Status: AC | PRN
  Administered 2023-07-14: 500 [IU]

## 2023-07-14 MED ORDER — FAMOTIDINE IN NACL 20-0.9 MG/50ML-% IV SOLN
20.0000 mg | Freq: Once | INTRAVENOUS | Status: AC
  Administered 2023-07-14: 20 mg via INTRAVENOUS
  Filled 2023-07-14: qty 50

## 2023-07-14 MED ORDER — SODIUM CHLORIDE 0.9 % IV SOLN: Freq: Once | INTRAVENOUS | Status: AC

## 2023-07-14 MED ORDER — SODIUM CHLORIDE 0.9% FLUSH
10.0000 mL | INTRAVENOUS | Status: DC | PRN
  Administered 2023-07-14: 10 mL via INTRAVENOUS

## 2023-07-14 MED ORDER — IPRATROPIUM-ALBUTEROL 0.5-2.5 (3) MG/3ML IN SOLN
3.0000 mL | Freq: Once | RESPIRATORY_TRACT | Status: AC
  Administered 2023-07-14: 3 mL via RESPIRATORY_TRACT
  Filled 2023-07-14: qty 3

## 2023-07-14 MED ORDER — CYANOCOBALAMIN 1000 MCG/ML IJ SOLN
1000.0000 ug | Freq: Once | INTRAMUSCULAR | Status: AC
  Administered 2023-07-14: 1000 ug via INTRAMUSCULAR
  Filled 2023-07-14: qty 1

## 2023-07-14 MED ORDER — LIDOCAINE-PRILOCAINE 2.5-2.5 % EX CREA: TOPICAL_CREAM | CUTANEOUS | 3 refills | Status: DC

## 2023-07-14 MED ORDER — SODIUM CHLORIDE 0.9 % IV SOLN
1.0000 mg/kg | Freq: Once | INTRAVENOUS | Status: AC
  Administered 2023-07-14: 100 mg via INTRAVENOUS
  Filled 2023-07-14: qty 20

## 2023-07-14 MED ORDER — CETIRIZINE HCL 10 MG/ML IV SOLN
10.0000 mg | Freq: Once | INTRAVENOUS | Status: AC
  Administered 2023-07-14: 10 mg via INTRAVENOUS
  Filled 2023-07-14: qty 1

## 2023-07-14 MED ORDER — SODIUM CHLORIDE 0.9 % IV SOLN
400.0000 mg/m2 | Freq: Once | INTRAVENOUS | Status: AC
  Administered 2023-07-14: 900 mg via INTRAVENOUS
  Filled 2023-07-14: qty 20

## 2023-07-14 MED ORDER — SODIUM CHLORIDE 0.9% FLUSH
10.0000 mL | INTRAVENOUS | Status: DC | PRN
  Administered 2023-07-14: 10 mL

## 2023-07-14 MED ORDER — SODIUM CHLORIDE 0.9 % IV SOLN
583.2000 mg | Freq: Once | INTRAVENOUS | Status: AC
  Administered 2023-07-14: 580 mg via INTRAVENOUS
  Filled 2023-07-14: qty 58

## 2023-07-14 NOTE — Progress Notes (Signed)
Patient has been examined by Dr. Katragadda. Vital signs and labs have been reviewed by MD - ANC, Creatinine, LFTs, hemoglobin, and platelets are within treatment parameters per M.D. - pt may proceed with treatment.  Primary RN and pharmacy notified.  

## 2023-07-14 NOTE — Progress Notes (Signed)

## 2023-07-14 NOTE — Patient Instructions (Signed)

## 2023-07-14 NOTE — Progress Notes (Signed)
Labs reviewed with MD today at office visit. OK to treat per MD. MD aware of K+ level. No further orders at this time.    Patient has a congested cough due to recent pneumonia , Lungs auscultated by RN, bilateral upper lobes wheezing noted. MD notified and will order duo nebulizer per MD.   Patient is currently sill on antibiotic regimen.    Treatment given per orders. Patient tolerated it well without problems. Vitals stable and discharged home from clinic via wheelchair. Follow up as scheduled.

## 2023-07-14 NOTE — Progress Notes (Signed)
Patients port flushed without difficulty.  Good blood return noted with no bruising or swelling noted at site.  VSS. Patient remains accessed for treatment.  

## 2023-07-14 NOTE — Patient Instructions (Signed)
[facility]  Discharge Instructions: Thank you for choosing [facility] to provide your oncology and hematology care.  If you have a lab appointment with the Cancer Center - please note that after April 8th, 2024, all labs will be drawn in the cancer center.  You do not have to check in or register with the main entrance as you have in the past but will complete your check-in in the cancer center.  Wear comfortable clothing and clothing appropriate for easy access to any Portacath or PICC line.   We strive to give you quality time with your provider. You may need to reschedule your appointment if you arrive late (15 or more minutes).  Arriving late affects you and other patients whose appointments are after yours.  Also, if you miss three or more appointments without notifying the office, you may be dismissed from the clinic at the provider's discretion.      For prescription refill requests, have your pharmacy contact our office and allow 72 hours for refills to be completed.    Today you received the following chemotherapy and/or immunotherapy agents Carboplatin, opdivo, yervoy, alimta   To help prevent nausea and vomiting after your treatment, we encourage you to take your nausea medication as directed.  BELOW ARE SYMPTOMS THAT SHOULD BE REPORTED IMMEDIATELY: *FEVER GREATER THAN 100.4 F (38 C) OR HIGHER *CHILLS OR SWEATING *NAUSEA AND VOMITING THAT IS NOT CONTROLLED WITH YOUR NAUSEA MEDICATION *UNUSUAL SHORTNESS OF BREATH *UNUSUAL BRUISING OR BLEEDING *URINARY PROBLEMS (pain or burning when urinating, or frequent urination) *BOWEL PROBLEMS (unusual diarrhea, constipation, pain near the anus) TENDERNESS IN MOUTH AND THROAT WITH OR WITHOUT PRESENCE OF ULCERS (sore throat, sores in mouth, or a toothache) UNUSUAL RASH, SWELLING OR PAIN  UNUSUAL VAGINAL DISCHARGE OR ITCHING   Items with * indicate a potential emergency and should be followed up as soon as  possible or go to the Emergency Department if any problems should occur.  Please show the CHEMOTHERAPY ALERT CARD or IMMUNOTHERAPY ALERT CARD at check-in to the Emergency Department and triage nurse.  Should you have questions after your visit or need to cancel or reschedule your appointment, please contact [facility] [phone]  and follow the prompts.  Office hours are 8:00 a.m. to 4:30 p.m. Monday - Friday. Please note that voicemails left after 4:00 p.m. may not be returned until the following business day.  We are closed weekends and major holidays. You have access to a nurse at all times for urgent questions. Please call the main number to the clinic [phone] and follow the prompts.  For any non-urgent questions, you may also contact your provider using MyChart. We now offer e-Visits for anyone 64 and older to request care online for non-urgent symptoms. For details visit [email or web address].   Also download the MyChart app! Go to the app store, search "MyChart", open the app, select , and log in with your MyChart username and password.

## 2023-07-15 ENCOUNTER — Inpatient Hospital Stay: Admitting: Licensed Clinical Social Worker

## 2023-07-15 ENCOUNTER — Telehealth: Payer: Self-pay

## 2023-07-15 DIAGNOSIS — C3492 Malignant neoplasm of unspecified part of left bronchus or lung: Secondary | ICD-10-CM

## 2023-07-15 NOTE — Progress Notes (Signed)
[facility] Clinical Social Work  Initial Assessment   Miranda Perkins is a 62 y.o. year old female contacted by phone. Clinical Social Work was referred by medical provider for assessment of psychosocial needs.   SDOH (Social Determinants of Health) assessments performed: Yes SDOH Interventions    Flowsheet Row Patient Outreach Telephone from 07/12/2023 in [place] POPULATION HEALTH DEPARTMENT  SDOH Interventions   Housing Interventions Intervention Not Indicated  Transportation Interventions Intervention Not Indicated  Utilities Interventions Intervention Not Indicated       SDOH Screenings   Food Insecurity: No Food Insecurity (07/12/2023)  Housing: Low Risk  (07/12/2023)  Transportation Needs: No Transportation Needs (07/12/2023)  Utilities: Not At Risk (07/12/2023)  Alcohol Screen: Low Risk  (04/27/2023)  Depression (PHQ2-9): Low Risk  (04/15/2023)  Financial Resource Strain: Low Risk  (06/09/2023)  Physical Activity: Inactive (06/09/2023)  Social Connections: Unknown (06/09/2023)  Recent Concern: Social Connections - Moderately Isolated (04/27/2023)  Stress: Stress Concern Present (06/09/2023)  Tobacco Use: Medium Risk (07/12/2023)     Distress Screen completed: No     No data to display            Family/Social Information:  Housing Arrangement: patient lives with her husband Miranda Perkins and 80 year old grandchild.  The couple raised two of their grandchildren of a deceased daughter.  The second is off to college.   Family members/support persons in your life? Pt's primary support is her husband.  The couple has limited social support.  Pt has a daughter 3 hours away in [place] and a brother 3 hrs away in [place]. Transportation concerns: Pt's spouse is a school bus driver with some flexibility in his schedule to drive pt to appointments.  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  Pt's spouse is retired from [employer] and receives disability/retirement benefits from [employer].  Income source: VA Benefit Financial concerns: Yes, due to illness and/or loss of work during treatment Type of concern: Utilities and Rent/ mortgage Food access concerns: no Religious or spiritual practice: No Services Currently in place:  none  Coping/ Adjustment to diagnosis: Patient understands treatment plan and what happens next? yes Concerns about diagnosis and/or treatment: Losing my job and/or losing income, Overwhelmed by information, and Quality of life Patient reported stressors: Adjusting to my illness Hopes and/or priorities: Pt's priority is to start treatment w/ the hope of positive results. Patient enjoys time with family/ friends Current coping skills/ strengths: Capable of independent living , Motivation for treatment/growth , and Supportive family/friends     SUMMARY: Current SDOH Barriers:  Financial constraints related to loss of income and Limited social support  Clinical Social Work Clinical Goal(s):  Explore community resource options for unmet needs related to:  Financial Strain   Interventions: Discussed common feeling and emotions when being diagnosed with cancer, and the importance of support during treatment Informed patient of the support team roles and support services at [facility] Provided CSW contact information and encouraged patient to call with any questions or concerns Referred patient to Marijean Niemann and informed of the [employer] for financial resources and supportive services   Follow Up Plan: Patient will contact CSW with any support or resource needs Patient verbalizes understanding of plan: Yes    Miranda Moulds, LCSW Clinical Social Worker [facility]

## 2023-07-15 NOTE — Telephone Encounter (Signed)
24 hour follow up-denies any nausea or vomiting, eating well today. Bowels are working, no diarrhea noted. Patient reported chills today but did not check tempeture. Encouraged patient to check her temperature throughout the day. She understood.

## 2023-07-16 ENCOUNTER — Encounter (HOSPITAL_COMMUNITY): Payer: Self-pay | Admitting: Hematology

## 2023-07-19 ENCOUNTER — Encounter: Payer: Self-pay | Admitting: Hematology

## 2023-07-19 ENCOUNTER — Encounter (HOSPITAL_COMMUNITY): Payer: Self-pay | Admitting: Hematology

## 2023-07-19 ENCOUNTER — Inpatient Hospital Stay: Admitting: Dietician

## 2023-07-19 NOTE — Telephone Encounter (Signed)
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 Pt has completed 4/5 planned fractions to right adrenal under the care of Dr. Langston Masker (final 8/30). She is currently receiving chemoimmunotherapy with carboplatin, alimta + opdivo (start 9/11).   9/3-9/8 [facility] admission with PNA, BLE swelling 8/18-8/24 [facility] admission with ileus, persistent C diff 8/9-8/11 [facility] admission with intractable N/V  Spoke with pt via telephone. Pt reports doing okay. She has been running low-grade fever (100.3-100.4) the last 3 nights. This resolves with tylenol. She reports having no appetite. Pt says she is forcing herself to eat 3x/day. Had grits for breakfast, yogurt for lunch. Pt recalls potato soup for dinner. She is drinking one Boost. Pt unsure if the Boost or antibiotics that "sends her to the bathroom." Pt finished antibiotics on Saturday. She reports 2 soft bowel movements/day. Nausea is well controlled with compazine. Pt reports thick saliva persists.   Medications: lasix 40 mg/day, folic acid  Labs: 9/11 - Na 130, K 3.3, BUN 7, albumin 2.1, Corrected Ca 9.32  Anthropometrics: Wt 243 lb 14.4 oz on 9/11 (bilateral LE edema noted per MD)   NUTRITION DIAGNOSIS: Unintended wt loss - suspect ongoing    INTERVENTION:  Recommend increasing Boost to 2/day - Suggested pt drink half (4oz) Boost QID - to improve tolerance Encouraged small bites/sips q2h vs eating 3x/day Reviewed foods with protein, recommend protein source at every meal Suggested adding a bedtime snack Continue compazine for nausea    MONITORING, EVALUATION, GOAL: wt trends, intake   NEXT VISIT: Brief nutrition follow-up Thursday 9/19 during IVF

## 2023-07-20 ENCOUNTER — Encounter: Payer: Self-pay | Admitting: Obstetrics & Gynecology

## 2023-07-20 ENCOUNTER — Ambulatory Visit: Admitting: Obstetrics & Gynecology

## 2023-07-20 VITALS — BP 119/78 | HR 103 | Wt 233.0 lb

## 2023-07-20 DIAGNOSIS — N950 Postmenopausal bleeding: Secondary | ICD-10-CM

## 2023-07-20 MED ORDER — PROGESTERONE 200 MG PO CAPS: ORAL_CAPSULE | ORAL | 3 refills | Status: DC

## 2023-07-20 NOTE — Progress Notes (Signed)
Follow up appointment for response: Vaginal bleeding  Chief Complaint  Patient presents with   Vaginal Bleeding    I would like to have a conversation with Dr. [name] Patient is having dark red bleeding. Having chemo treatment for cancer.    Blood pressure 119/78, pulse (!) 103, weight 233 lb (105.7 kg).  Pt had heavy appropriately timed withdrawal bleeding after prometirum 200 mg x 10 d Was going to do every 3 months but since her bleeding was heavy will ^to every month EMBx 6/24 was benign but concern over endogenous estrogen production and liklihood of ongoing low level endometrial stimulation, hence cyclical prometrium  Since it was heavy even though she has had a whole host of concurrent medical issues I will increase to 10 days monthly and see what volume of withdrawal bleeding that produceds If still heavy predictably will consider daily prometrium  MEDS ordered this encounter: Meds ordered this encounter  Medications   progesterone (PROMETRIUM) 200 MG capsule    Sig: 1 tablet daily at bedtime the first 10 days of each calendar month    Dispense:  30 capsule    Refill:  3    Orders for this encounter: No orders of the defined types were placed in this encounter.   Impression + Management Plan   ICD-10-CM   1. PMB (postmenopausal bleeding): endometrium is benign with benign endometrium  N95.0    ^cycling to monthly prometrium 200 qhs x 10 beginning 08/03/23      Follow Up: Return in about 6 months (around 01/17/2024) for Follow up, with Dr Miranda Perkins.     All questions were answered.  Past Medical History:  Diagnosis Date   Adrenal hemorrhage (HCC)    Anemia    Arthritis    Cancer (HCC)    skin cancer - basal   Dyspnea    Edema of both lower extremities    Endometrial polyp    Hirsutism    History of 2019 novel coronavirus disease (COVID-19) 12/21/2020   positive home result documented in pcp note in epic 12-23-2020,  residual doe, pulmonology consult w/ dr wert  note in epic 03-20-2021   History of basal cell carcinoma (BCC) excision 2016   forehead, per pt no recurrence   History of CVA (cerebrovascular accident) without residual deficits 1993   pt stated , while on Chloramphenicol, no residuaL; [medication given to treat tick bite] ; patient states  "i stroked out right next to my doctor , he said i passed out and that was my only symptom" ;  no resiudal   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 branch,  work-up results in epic 03/ 2022 (normal nuclear stress test, normal echo, no arrhythmia's per event monitor);   (05-28-2021 per pt no symptoms since 03/ 2022)   Multiple thyroid nodules    endocrinologist--- dr g. nida,  hx benign bx, clinically euthyroid   Osteopenia 12/2017   T score -1.2 FRAX 4.9% / 0.3%   PMB (postmenopausal bleeding)    Pneumonia    x 2   Post-COVID chronic dyspnea    Pre-diabetes    Rosacea    Stroke (HCC)    Thickened endometrium     Past Surgical History:  Procedure Laterality Date   ANTERIOR AND POSTERIOR REPAIR N/A 08/21/2020   Procedure: ANTERIOR (  CYSTOCELE) AND POSTERIOR REPAIR (RECTOCELE);  Surgeon: Genia Del, MD;  Location: [facility];  Service: Gynecology;  Laterality: N/A;  requesting 9:00am OR time  requests one hour   ANTERIOR CERVICAL DECOMP/DISCECTOMY FUSION N/A 09/27/2017   Procedure: ANTERIOR CERVICAL DECOMPRESSION/DISCECTOMY FUSION CERVICAL FOUR-FIVE ,CERVICAL FIVE-SIX,CERVICAL SIX-SEVEN;  Surgeon: Donalee Citrin, MD;  Location: [facility];  Service: Neurosurgery;  Laterality: N/A;   AXILLARY LYMPH NODE BIOPSY Right 06/24/2023   Procedure: AXILLARY LYMPH NODE BIOPSY;  Surgeon: Franky Macho, MD;  Location: [facility];  Service: General;  Laterality: Right;   BRONCHIAL BIOPSY  06/14/2023   Procedure: BRONCHIAL BIOPSIES;  Surgeon: Leslye Peer, MD;  Location: [facility] ENDOSCOPY;  Service: Pulmonary;;   BRONCHIAL BRUSHINGS  06/14/2023   Procedure: BRONCHIAL BRUSHINGS;  Surgeon: Leslye Peer, MD;  Location: [facility] ENDOSCOPY;  Service: Pulmonary;;   BRONCHIAL NEEDLE ASPIRATION BIOPSY  06/14/2023   Procedure: BRONCHIAL NEEDLE ASPIRATION BIOPSIES;  Surgeon: Leslye Peer, MD;  Location: [facility] ENDOSCOPY;  Service: Pulmonary;;   CARPAL TUNNEL RELEASE Left 09/27/2017   Procedure: CARPAL TUNNEL RELEASE;  Surgeon: Donalee Citrin, MD;  Location: [facility] OR;  Service: Neurosurgery;  Laterality: Left;   DILATATION & CURETTAGE/HYSTEROSCOPY WITH MYOSURE N/A 06/03/2021   Procedure: DILATATION & CURETTAGE/HYSTEROSCOPY WITH MYOSURE;  Surgeon: Genia Del, MD;  Location: [facility];  Service: Gynecology;  Laterality: N/A;   FOOT SURGERY     bone removed from pinky toe   HEMOSTASIS CONTROL  06/14/2023   Procedure: HEMOSTASIS CONTROL;  Surgeon: Leslye Peer, MD;  Location: [facility] ENDOSCOPY;  Service: Pulmonary;;   INGUINAL HERNIA REPAIR Right 1995   KNEE ARTHROSCOPY W/ MENISCAL REPAIR Left 06/21/2017   dr Netta Corrigan   LEG SURGERY  1972   MOHS SURGERY  2016   [facility] of forehead   MOUTH SURGERY  2015   removal and cauterization pallete of cancerous lesion   PORTACATH PLACEMENT N/A 06/24/2023   Procedure: INSERTION PORT-A-CATH;  Surgeon: Franky Macho, MD;  Location: [facility];  Service: General;  Laterality: N/A;   TONSILLECTOMY  1979   TONSILLECTOMY  1977   TOTAL KNEE ARTHROPLASTY Left 01/26/2018   Procedure: LEFT TOTAL KNEE ARTHROPLASTY;  Surgeon: Ranee Gosselin, MD;  Location: [facility];  Service: Orthopedics;  Laterality: Left;   TUBAL LIGATION     VIDEO BRONCHOSCOPY WITH ENDOBRONCHIAL ULTRASOUND Bilateral 06/14/2023   Procedure: VIDEO BRONCHOSCOPY WITH ENDOBRONCHIAL ULTRASOUND;  Surgeon: Leslye Peer, MD;  Location: [facility] ENDOSCOPY;  Service: Pulmonary;  Laterality: Bilateral;    OB History     Gravida  4   Para  3   Term      Preterm       AB  1   Living  2      SAB  1   IAB      Ectopic      Multiple      Live Births  3           Allergies  Allergen Reactions   Influenza Vaccines Anaphylaxis and Other (See Comments)    Per patient   Penicillins Anaphylaxis and Other (See Comments)   Articaine Other (See Comments)    Caused infection   Cortisone Other (See Comments)    Turned red and ran a low grade fever for 3 days   Other Rash    bandaids- skin turns red and a rash     Social History   Socioeconomic History   Marital status: Married    Spouse  name: Not on file   Number of children: 3   Years of education: Not on file   Highest education level: 12th grade  Occupational History   Occupation: retired    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, television/film/video for [employer]   Tobacco Use   Smoking status: Former    Current packs/day: 0.00    Average packs/day: 0.5 packs/day for 35.0 years (17.5 ttl pk-yrs)    Types: Cigarettes    Start date: 11/26/1974    Quit date: 11/26/2009    Years since quitting: 13.6   Smokeless tobacco: Never  Vaping Use   Vaping status: Never Used  Substance and Sexual Activity   Alcohol use: Not Currently   Drug use: No   Sexual activity: Not Currently    Partners: Male    Birth control/protection: Post-menopausal    Comment: 1st intercourse 84 yo-5 partners, married- 25 yrs  Other Topics Concern   Not on file  Social History Narrative   Lives with husband. Raising 2 grandchildren of deceased daughter.       Lives in one story ranch house.    Social Determinants of Health   Financial Resource Strain: Low Risk  (06/09/2023)   Overall Financial Resource Strain (CARDIA)    Difficulty of Paying Living Expenses: Not hard at all  Food Insecurity: No Food Insecurity (07/12/2023)   Hunger Vital Sign    Worried About Running Out of Food in the Last Year: Never true    Ran Out of Food in the Last Year: Never true  Transportation Needs: No Transportation Needs (07/12/2023)   PRAPARE -  Administrator, Civil Service (Medical): No    Lack of Transportation (Non-Medical): No  Physical Activity: Inactive (06/09/2023)   Exercise Vital Sign    Days of Exercise per Week: 0 days    Minutes of Exercise per Session: 90 min  Stress: Stress Concern Present (06/09/2023)   [employer] of Occupational Health - Occupational Stress Questionnaire    Feeling of Stress : To some extent  Social Connections: Unknown (06/09/2023)   Social Connection and Isolation Panel [NHANES]    Frequency of Communication with Friends and Family: More than three times a week    Frequency of Social Gatherings with Friends and Family: More than three times a week    Attends Religious Services: Patient declined    Database administrator or Organizations: No    Attends Banker Meetings: Never    Marital Status: Married  Recent Concern: Social Connections - Moderately Isolated (04/27/2023)   Social Connection and Isolation Panel [NHANES]    Frequency of Communication with Friends and Family: More than three times a week    Frequency of Social Gatherings with Friends and Family: Never    Attends Religious Services: Never    Database administrator or Organizations: No    Attends Engineer, structural: Never    Marital Status: Married    Family History  Problem Relation Age of Onset   Diabetes Mother    Heart Problems Mother    Rheum arthritis Mother    COPD Mother    Polycystic kidney disease Mother    Carpal tunnel syndrome Mother    Cancer Father        Liver Cancer   Liver cancer Father    Carpal tunnel syndrome Sister    Thyroid disease Sister    Rheum arthritis Sister    Lung disease Sister    Diabetes Sister  Carpal tunnel syndrome Sister    Clotting disorder Sister    Rheum arthritis Sister    Thyroid disease Sister    COPD Sister    Heart Problems Sister    Gout Sister    Cancer Brother    Heart Problems Maternal Grandmother    Cancer Maternal  Grandfather        Lung Cancer   Heart Problems Maternal Grandfather    Heart Problems Paternal Grandmother    Cancer Paternal Grandfather        Brain & Skin cancer   Polycystic ovary syndrome Daughter    Rheum arthritis Maternal Aunt    Fibromyalgia Maternal Aunt    Polycystic kidney disease Maternal Aunt    Heart Problems Maternal Aunt    Anemia Maternal Aunt    Adrenal disorder Maternal Aunt    Carpal tunnel syndrome Maternal Aunt    Diabetes Maternal Aunt    Thyroid disease Maternal Aunt    Rheum arthritis Maternal Aunt    Diabetes Maternal Aunt    Diabetes Maternal Aunt    Rheum arthritis Maternal Aunt    Heart Problems Maternal Aunt    Rheum arthritis Maternal Uncle    Heart Problems Maternal Uncle

## 2023-07-21 ENCOUNTER — Other Ambulatory Visit: Payer: Self-pay | Admitting: *Deleted

## 2023-07-21 ENCOUNTER — Encounter (HOSPITAL_COMMUNITY): Payer: Self-pay | Admitting: Hematology

## 2023-07-21 ENCOUNTER — Encounter: Payer: Self-pay | Admitting: Hematology

## 2023-07-21 ENCOUNTER — Inpatient Hospital Stay

## 2023-07-21 ENCOUNTER — Encounter: Payer: Self-pay | Admitting: Cardiology

## 2023-07-21 MED ORDER — FOLIC ACID 1 MG PO TABS: 1.0000 mg | ORAL_TABLET | Freq: Every day | ORAL | 3 refills | Status: DC

## 2023-07-21 NOTE — Telephone Encounter (Signed)
Per last office note, okay to refill Folic acid.  [facility].

## 2023-07-22 ENCOUNTER — Inpatient Hospital Stay: Admitting: Dietician

## 2023-07-22 ENCOUNTER — Inpatient Hospital Stay

## 2023-07-22 ENCOUNTER — Inpatient Hospital Stay (HOSPITAL_BASED_OUTPATIENT_CLINIC_OR_DEPARTMENT_OTHER): Admitting: Oncology

## 2023-07-22 ENCOUNTER — Encounter (HOSPITAL_COMMUNITY): Payer: Self-pay | Admitting: Hematology

## 2023-07-22 VITALS — BP 111/54 | HR 103 | Temp 99.1°F | Resp 19

## 2023-07-22 DIAGNOSIS — T451X5A Adverse effect of antineoplastic and immunosuppressive drugs, initial encounter: Secondary | ICD-10-CM

## 2023-07-22 DIAGNOSIS — C3492 Malignant neoplasm of unspecified part of left bronchus or lung: Secondary | ICD-10-CM

## 2023-07-22 DIAGNOSIS — Z5111 Encounter for antineoplastic chemotherapy: Secondary | ICD-10-CM | POA: Diagnosis not present

## 2023-07-22 DIAGNOSIS — E441 Mild protein-calorie malnutrition: Secondary | ICD-10-CM

## 2023-07-22 DIAGNOSIS — E860 Dehydration: Secondary | ICD-10-CM

## 2023-07-22 DIAGNOSIS — D701 Agranulocytosis secondary to cancer chemotherapy: Secondary | ICD-10-CM | POA: Diagnosis not present

## 2023-07-22 DIAGNOSIS — E876 Hypokalemia: Secondary | ICD-10-CM

## 2023-07-22 DIAGNOSIS — R112 Nausea with vomiting, unspecified: Secondary | ICD-10-CM

## 2023-07-22 LAB — COMPREHENSIVE METABOLIC PANEL
ALT: 30 U/L (ref 0–44)
AST: 30 U/L (ref 15–41)
Albumin: 2.3 g/dL — ABNORMAL LOW (ref 3.5–5.0)
Alkaline Phosphatase: 156 U/L — ABNORMAL HIGH (ref 38–126)
Anion gap: 11 (ref 5–15)
BUN: 9 mg/dL (ref 8–23)
CO2: 27 mmol/L (ref 22–32)
Calcium: 7.9 mg/dL — ABNORMAL LOW (ref 8.9–10.3)
Chloride: 94 mmol/L — ABNORMAL LOW (ref 98–111)
Creatinine, Ser: 0.45 mg/dL (ref 0.44–1.00)
GFR, Estimated: >60 mL/min (ref >60)
Glucose, Bld: 101 mg/dL — ABNORMAL HIGH (ref 70–99)
Potassium: 2.8 mmol/L — ABNORMAL LOW (ref 3.5–5.1)
Sodium: 132 mmol/L — ABNORMAL LOW (ref 135–145)
Total Bilirubin: 0.6 mg/dL (ref 0.3–1.2)
Total Protein: 5.7 g/dL — ABNORMAL LOW (ref 6.5–8.1)

## 2023-07-22 LAB — CBC WITH DIFFERENTIAL/PLATELET
Abs Immature Granulocytes: 0 10*3/uL (ref 0.00–0.07)
Basophils Absolute: 0 10*3/uL (ref 0.0–0.1)
Basophils Relative: 1 %
Eosinophils Absolute: 0 10*3/uL (ref 0.0–0.5)
Eosinophils Relative: 0 %
HCT: 30.4 % — ABNORMAL LOW (ref 36.0–46.0)
Hemoglobin: 9.4 g/dL — ABNORMAL LOW (ref 12.0–15.0)
Lymphocytes Relative: 35 %
Lymphs Abs: 0.6 10*3/uL — ABNORMAL LOW (ref 0.7–4.0)
MCH: 24.9 pg — ABNORMAL LOW (ref 26.0–34.0)
MCHC: 30.9 g/dL (ref 30.0–36.0)
MCV: 80.4 fL (ref 80.0–100.0)
Monocytes Absolute: 0.1 10*3/uL (ref 0.1–1.0)
Monocytes Relative: 6 %
Neutro Abs: 0.9 10*3/uL — ABNORMAL LOW (ref 1.7–7.7)
Neutrophils Relative %: 58 %
Platelets: 159 10*3/uL (ref 150–400)
RBC: 3.78 MIL/uL — ABNORMAL LOW (ref 3.87–5.11)
RDW: 17.2 % — ABNORMAL HIGH (ref 11.5–15.5)
WBC: 1.6 10*3/uL — ABNORMAL LOW (ref 4.0–10.5)
nRBC: 0 % (ref 0.0–0.2)

## 2023-07-22 LAB — MAGNESIUM: Magnesium: 1.7 mg/dL (ref 1.7–2.4)

## 2023-07-22 MED ORDER — SODIUM CHLORIDE 0.9% FLUSH
10.0000 mL | Freq: Once | INTRAVENOUS | Status: AC
  Administered 2023-07-22: 10 mL via INTRAVENOUS

## 2023-07-22 MED ORDER — FOLIC ACID 1 MG PO TABS: 1.0000 mg | ORAL_TABLET | Freq: Every day | ORAL | 3 refills | Status: DC

## 2023-07-22 MED ORDER — POTASSIUM CHLORIDE IN NACL 40-0.9 MEQ/L-% IV SOLN
Freq: Once | INTRAVENOUS | Status: AC
  Filled 2023-07-22: qty 1000

## 2023-07-22 MED ORDER — SODIUM CHLORIDE 0.9% FLUSH
10.0000 mL | INTRAVENOUS | Status: AC
  Administered 2023-07-22: 10 mL

## 2023-07-22 MED ORDER — MEGESTROL ACETATE 20 MG PO TABS: 20.0000 mg | ORAL_TABLET | Freq: Two times a day (BID) | ORAL | 0 refills | Status: DC

## 2023-07-22 MED ORDER — HEPARIN SOD (PORK) LOCK FLUSH 100 UNIT/ML IV SOLN
500.0000 [IU] | Freq: Once | INTRAVENOUS | Status: AC
  Administered 2023-07-22: 500 [IU] via INTRAVENOUS

## 2023-07-22 MED ORDER — MEGESTROL ACETATE 400 MG/10ML PO SUSP: 400.0000 mg | Freq: Two times a day (BID) | ORAL | 0 refills | Status: DC

## 2023-07-22 MED ORDER — METOPROLOL TARTRATE 25 MG PO TABS: 25.0000 mg | ORAL_TABLET | Freq: Two times a day (BID) | ORAL | 2 refills | Status: DC

## 2023-07-22 NOTE — Progress Notes (Signed)
[facility] [name] [place] [address]    Clinic Day:  07/22/2023  Referring physician: [name], DO  Patient Care Team: [name], DO as PCP - General (Family Medicine) [name] [name], MD as PCP - Cardiology (Cardiology) [name], MD as Medical Oncologist (Medical Oncology) [name], RN as Oncology Nurse Navigator (Medical Oncology)   ASSESSMENT & PLAN:   Assessment: 1.  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 - CTAP (03/28/2023): High density enlargement of the right adrenal gland with stranding compatible with adrenal hemorrhage.  Continues along the superior margin of the right kidney. - CTAP on 05/07/2023: Interval increase in size of the right adrenal gland measuring 7.6 cm, previously 4.9 cm.  A large area of decreased attenuation noted likely related to resolving hematoma.  No active hemorrhage seen. - CTAP (05/20/2023): Further enlargement of right adrenal gland measuring 8.9 x 6.7 cm, interim finding of moderate slightly loculated fluid surrounding the superior pole of the right kidney. - CT chest angiogram (05/20/2023): Solid left hilar mass measuring 5.3 x 3.9 cm with partial encasement of left-sided pulmonary vessels.  Mild narrowing of the left lower lobe bronchi but no occlusion.  Irregular focus in the left lower lobe measuring 16 x 8 mm. - Guardant360: BRIP1, PTEN deletion,STK11,KRAS G12A, KRAS amplification - NGS: PD-L1 (22 C3) TPS score 75%.  K-ras G 12A, STK 11. - Cycle 1 of carboplatin, pemetrexed, nivolumab and ipilimumab (Checkmate 9 LA) started on 07/14/2023.   2.  Social/family history: - Lives with husband at home.  Independent of ADLs and IADLs.  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  Quit cigarette smoking in 2010 and smoked half pack per day for 15 years. - No family history of bleeding.  Mother had 3 miscarriages and 5 levels.  No family history of antiphospholipid syndrome.  Father and paternal grandfather had cancer.  Maternal first cousin had blood cancer.    Plan:  1. Hypokalemia -Secondary to poor oral intake and not taking her potassium supplements. -We discussed restarting her potassium supplements as able. -Will give 40 mill equivalents potassium while in clinic today.  2. Chemotherapy-induced neutropenia (HCC) -Labs from today show a white count of 1.6 with an ANC of 900. -Reports low-grade fevers at home as high as 100.5 resolved with Tylenol. -No current abdominal pain, diarrhea, UTI-like symptoms or upper respiratory symptoms. -She is afebrile while in clinic today. -Discussed in detail neutropenic fever and workup it would entail.  Please let us know if you develop a fever greater than 100.4.  3. Metastatic primary lung cancer, left (HCC) -She is status post cycle 1 [place], [place], Alimta and Opdivo on 07/14/2023.  -Appears to have tolerated well. -Labs from 07/22/2023 shows sodium of 132, potassium at 2.8 with elevated alkaline phosphatase. -WBC 1.6, hemoglobin 9.4 with platelets of 159.  Differential shows a absolute neutrophil count of 900.   -She is scheduled for follow-up with Dr. Ellin Saba on 08/04/2023 for cycle 2. -Discussed briefly molecular testing versus tissue biopsy results.  Will let Dr. Ellin Saba discuss further in detail during her next visit.  4. Dehydration -Discussed increasing fluid intake. -She will receive 1 L normal saline today while in clinic.  5. Nausea and  vomiting, unspecified vomiting type -Continue Zofran and Compazine as needed.  6. Mild protein-calorie malnutrition (HCC) -Continues to lose weight.  Reports no appetite. -Discussed continuing high-protein diet. -Discussed trying Megace 20 mg twice daily  to see if this helps her appetite.   PLAN SUMMARY: >> Proceed with 1 L normal saline and 40 mill equivalents potassium.  Please start potassium supplements and take as you can tolerate. >> Return to clinic as scheduled for next cycle of chemo with labs.  >> Recommend starting Megace twice daily for appetite stimulant.      No orders of the defined types were placed in this encounter.   Mauro Kaufmann, NP   9/19/20249:16 PM  CHIEF COMPLAINT:   Diagnosis: Metastatic lung cancer to the adrenal gland    Cancer Staging  Metastatic primary lung cancer, left [facility]) Staging form: Lung, AJCC 8th Edition - Clinical stage from 07/01/2023: Stage IVB (cT3, cN0, pM1c) - Signed by [name], MD on 07/01/2023    Prior Therapy: none  Current Therapy: Carboplatin, pemetrexed, nivolumab and ipilimumab   HISTORY OF PRESENT ILLNESS:   Oncology History  Metastatic primary lung cancer, left (HCC)  06/23/2023 Initial Diagnosis   Metastatic primary lung cancer, left (HCC)   07/01/2023 Cancer Staging   Staging form: Lung, AJCC 8th Edition - Clinical stage from 07/01/2023: Stage IVB (cT3, cN0, pM1c) - Signed by [name], MD on 07/01/2023 Histopathologic type: Adenocarcinoma, NOS   07/14/2023 -  Chemotherapy   Patient is on Treatment Plan : LUNG NSCLC NON-SQUAMOUS Nivolumab + Ipilimumab + Carboplatin + Pemetrexed q42d X 1 cycle / Maintenance Nivolumab + Ipilimumab q42d        INTERVAL HISTORY:   Miranda Perkins is a 62 y.o. female presenting to clinic today for follow up of Metastatic lung cancer to the adrenal gland. She was last seen by Dr. Ellin Saba on 07/15/2023 prior to cycle 1 of [place], [place], Alimta and Opdivo.   Reports tolerating chemo well.  Appetite and energy levels are very low although she is eating.  Her husband reports he is feeding her at least 60 g of protein each day in the form of a milkshake or smoothie.  She is drinking fluids and is trying to stay hydrated.  Reports she is currently not taking her potassium supplements because they are too big.  She is still continuing to recover from her recent hospitalization.  Denies any additional diarrhea but having soft stools daily.  Has had an occasional low-grade temperature as high as 100.5 per her husband.  He has given her Tylenol with resolution of her symptoms.  No fever as of today.  Reports a little bit of nausea without vomiting.  Has a chronic cough and shortness of breath.  Has increased urinary frequency secondary to taking Lasix but denies dysuria or hematuria.  Reports she is able to get up and down from a chair at home but requires assistance.  Has been states she has 2 small spots on her bottom that he is currently caring for with zinc oxide which appears to be helping.  States they are the size of a quarter.  Wears a depends for incontinence.   Reports they have questions regarding molecular testing that was completed suggesting GI origin versus lung.  PAST MEDICAL HISTORY:   Past Medical History: Past Medical History:  Diagnosis Date   Adrenal hemorrhage (HCC)    Anemia    Arthritis    Cancer (HCC)    skin cancer - basal  Dyspnea    Edema of both lower extremities    Endometrial polyp    Hirsutism    History of 2019 novel coronavirus disease (COVID-19) 12/21/2020   positive home result documented in pcp note in epic 12-23-2020,  residual doe, pulmonology consult w/ dr wert note in epic 03-20-2021   History of basal cell carcinoma (BCC) excision 2016   forehead, per pt no recurrence   History of CVA (cerebrovascular accident) without residual deficits 1993   pt stated , while on Chloramphenicol, no residuaL; [medication given to treat tick bite] ; patient states  "i stroked out right next to my doctor , he said i passed out and that was my only symptom" ;  no resiudal   History of mouth cancer 2015   per pt surgically removal and cauterized palette , was told cancerous but unknown  type, no recurrance   History of palpitations    post covid;  cardiology -- dr c. branch,  work-up results in epic 03/ 2022 (normal nuclear stress test, normal echo, no arrhythmia's per event monitor);   (05-28-2021 per pt no symptoms since 03/ 2022)   Multiple thyroid nodules    endocrinologist--- dr g. nida,  hx benign bx, clinically euthyroid   Osteopenia 12/2017   T score -1.2 FRAX 4.9% / 0.3%   PMB (postmenopausal bleeding)    Pneumonia    x 2   Post-COVID chronic dyspnea    Pre-diabetes    Rosacea    Stroke (HCC)    Thickened endometrium     Surgical History: Past Surgical History:  Procedure Laterality Date   ANTERIOR AND POSTERIOR REPAIR N/A 08/21/2020   Procedure: ANTERIOR (CYSTOCELE) AND POSTERIOR REPAIR (RECTOCELE);  Surgeon: Genia Del, MD;  Location: [facility];  Service: Gynecology;  Laterality: N/A;  requesting 9:00am OR time  requests one hour   ANTERIOR CERVICAL DECOMP/DISCECTOMY FUSION N/A 09/27/2017   Procedure: ANTERIOR CERVICAL DECOMPRESSION/DISCECTOMY FUSION CERVICAL FOUR-FIVE ,CERVICAL FIVE-SIX,CERVICAL SIX-SEVEN;  Surgeon: Donalee Citrin, MD;  Location: [facility] OR;  Service: Neurosurgery;  Laterality: N/A;   AXILLARY LYMPH NODE BIOPSY Right 06/24/2023   Procedure: AXILLARY LYMPH NODE BIOPSY;  Surgeon: Franky Macho, MD;  Location: [facility] ORS;  Service: General;  Laterality: Right;   BRONCHIAL BIOPSY  06/14/2023   Procedure: BRONCHIAL BIOPSIES;  Surgeon: Leslye Peer, MD;  Location: [facility] ENDOSCOPY;  Service: Pulmonary;;   BRONCHIAL BRUSHINGS  06/14/2023   Procedure: BRONCHIAL BRUSHINGS;  Surgeon: Leslye Peer, MD;  Location: [facility] ENDOSCOPY;  Service: Pulmonary;;   BRONCHIAL NEEDLE ASPIRATION BIOPSY  06/14/2023   Procedure: BRONCHIAL NEEDLE ASPIRATION BIOPSIES;  Surgeon: Leslye Peer, MD;  Location: [facility] ENDOSCOPY;  Service: Pulmonary;;   CARPAL TUNNEL RELEASE Left 09/27/2017   Procedure: CARPAL TUNNEL RELEASE;  Surgeon: Donalee Citrin, MD;  Location: [facility]  OR;  Service: Neurosurgery;  Laterality: Left;   DILATATION & CURETTAGE/HYSTEROSCOPY WITH MYOSURE N/A 06/03/2021   Procedure: DILATATION & CURETTAGE/HYSTEROSCOPY WITH MYOSURE;  Surgeon: Genia Del, MD;  Location: [facility];  Service: Gynecology;  Laterality: N/A;   FOOT SURGERY     bone removed from pinky toe   HEMOSTASIS CONTROL  06/14/2023   Procedure: HEMOSTASIS CONTROL;  Surgeon: Leslye Peer, MD;  Location: [facility] ENDOSCOPY;  Service: Pulmonary;;   INGUINAL HERNIA REPAIR Right 1995   KNEE ARTHROSCOPY W/ MENISCAL REPAIR Left 06/21/2017   dr Netta Corrigan   LEG SURGERY  1972   MOHS SURGERY  2016   BCC of forehead   MOUTH  SURGERY  2015   removal and cauterization pallete of cancerous lesion   PORTACATH PLACEMENT N/A 06/24/2023   Procedure: INSERTION PORT-A-CATH;  Surgeon: Franky Macho, MD;  Location: [facility];  Service: General;  Laterality: N/A;   TONSILLECTOMY  1979   TONSILLECTOMY  1977   TOTAL KNEE ARTHROPLASTY Left 01/26/2018   Procedure: LEFT TOTAL KNEE ARTHROPLASTY;  Surgeon: Ranee Gosselin, MD;  Location: [facility] ORS;  Service: Orthopedics;  Laterality: Left;   TUBAL LIGATION     VIDEO BRONCHOSCOPY WITH ENDOBRONCHIAL ULTRASOUND Bilateral 06/14/2023   Procedure: VIDEO BRONCHOSCOPY WITH ENDOBRONCHIAL ULTRASOUND;  Surgeon: Leslye Peer, MD;  Location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, television/film/video for [employer]   Tobacco Use   Smoking status: Former    Current packs/day: 0.00    Average packs/day: 0.5 packs/day for 35.0 years (17.5 ttl pk-yrs)    Types: Cigarettes    Start date: 11/26/1974    Quit date: 11/26/2009    Years since quitting: 13.6   Smokeless  tobacco: Never  Vaping Use   Vaping status: Never Used  Substance and Sexual Activity   Alcohol use: Not Currently   Drug use: No   Sexual activity: Not Currently    Partners: Male    Birth control/protection: Post-menopausal    Comment: 1st intercourse 67 yo-5 partners, married- 25 yrs  Other Topics Concern   Not on file  Social History Narrative   Lives with husband. Raising 2 grandchildren of deceased daughter.       Lives in one story ranch house.    Social Determinants of Health   Financial Resource Strain: Low Risk  (06/09/2023)   Overall Financial Resource Strain (CARDIA)    Difficulty of Paying Living Expenses: Not hard at all  Food Insecurity: No Food Insecurity (07/12/2023)   Hunger Vital Sign    Worried About Running Out of Food in the Last Year: Never true    Ran Out of Food in the Last Year: Never true  Transportation Needs: No Transportation Needs (07/12/2023)   PRAPARE - Administrator, Civil Service (Medical): No    Lack of Transportation (Non-Medical): No  Physical Activity: Inactive (06/09/2023)   Exercise Vital Sign    Days of Exercise per Week: 0 days    Minutes of Exercise per Session: 90 min  Stress: Stress Concern Present (06/09/2023)   [employer] of Occupational Health - Occupational Stress Questionnaire    Feeling of Stress : To some extent  Social Connections: Unknown (06/09/2023)   Social Connection and Isolation Panel [NHANES]    Frequency of Communication with Friends and Family: More than three times a week    Frequency of Social Gatherings with Friends and Family: More than three times a week    Attends Religious Services: Patient declined    Database administrator or Organizations: No    Attends Banker Meetings: Never    Marital Status: Married  Recent Concern: Social Connections - Moderately Isolated (04/27/2023)   Social Connection and Isolation Panel [NHANES]    Frequency of Communication with Friends and Family:  More than three times a week  Frequency of Social Gatherings with Friends and Family: Never    Attends Religious Services: Never    Database administrator or Organizations: No    Attends Banker Meetings: Never    Marital Status: Married  Catering manager Violence: Not At Risk (07/06/2023)   Humiliation, Afraid, Rape, and Kick questionnaire    Fear of Current or Ex-Partner: No    Emotionally Abused: No    Physically Abused: No    Sexually Abused: No    Family History: Family History  Problem Relation Age of Onset   Diabetes Mother    Heart Problems Mother    Rheum arthritis Mother    COPD Mother    Polycystic kidney disease Mother    Carpal tunnel syndrome Mother    Cancer Father        Liver Cancer   Liver cancer Father    Carpal tunnel syndrome Sister    Thyroid disease Sister    Rheum arthritis Sister    Lung disease Sister    Diabetes Sister    Carpal tunnel syndrome Sister    Clotting disorder Sister    Rheum arthritis Sister    Thyroid disease Sister    COPD Sister    Heart Problems Sister    Gout Sister    Cancer Brother    Heart Problems Maternal Grandmother    Cancer Maternal Grandfather        Lung Cancer   Heart Problems Maternal Grandfather    Heart Problems Paternal Grandmother    Cancer Paternal Grandfather        Brain & Skin cancer   Polycystic ovary syndrome Daughter    Rheum arthritis Maternal Aunt    Fibromyalgia Maternal Aunt    Polycystic kidney disease Maternal Aunt    Heart Problems Maternal Aunt    Anemia Maternal Aunt    Adrenal disorder Maternal Aunt    Carpal tunnel syndrome Maternal Aunt    Diabetes Maternal Aunt    Thyroid disease Maternal Aunt    Rheum arthritis Maternal Aunt    Diabetes Maternal Aunt    Diabetes Maternal Aunt    Rheum arthritis Maternal Aunt    Heart Problems Maternal Aunt    Rheum arthritis Maternal Uncle    Heart Problems Maternal Uncle     Current Medications:  Current Outpatient  Medications:    cefadroxil (DURICEF) 1 g tablet, Take 1 tablet (1 g total) by mouth 2 (two) times daily., Disp: 10 tablet, Rfl: 0   furosemide (LASIX) 20 MG tablet, Take 1 tablet (20 mg total) by mouth daily. (Patient taking differently: Take 40 mg by mouth daily.), Disp: 30 tablet, Rfl: 1   guaiFENesin (MUCINEX) 600 MG 12 hr tablet, Take 2 tablets (1,200 mg total) by mouth 2 (two) times daily., Disp: 20 tablet, Rfl: 0   lidocaine-prilocaine (EMLA) cream, Apply a quarter-sized amount to port a cath site and cover with plastic wrap 1 hour prior to infusion appointments, Disp: 30 g, Rfl: 0   lidocaine-prilocaine (EMLA) cream, Apply to affected area once, Disp: 30 g, Rfl: 3   megestrol (MEGACE) 20 MG tablet, Take 1 tablet (20 mg total) by mouth 2 (two) times daily., Disp: 90 tablet, Rfl: 0   metroNIDAZOLE (FLAGYL) 500 MG tablet, Take 1 tablet (500 mg total) by mouth 2 (two) times daily., Disp: 9 tablet, Rfl: 0   Multiple Vitamin (MULTIVITAMIN WITH MINERALS) TABS tablet, Take 1 tablet by mouth daily., Disp: 100 tablet, Rfl: 1  oxyCODONE (OXY IR/ROXICODONE) 5 MG immediate release tablet, Take 1 tablet (5 mg total) by mouth every 4 (four) hours as needed for moderate pain., Disp: 180 tablet, Rfl: 0   potassium chloride (KLOR-CON M) 10 MEQ tablet, Take 1 tablet (10 mEq total) by mouth daily., Disp: 30 tablet, Rfl: 0   prochlorperazine (COMPAZINE) 10 MG tablet, Take 1 tablet (10 mg total) by mouth every 6 (six) hours as needed for nausea or vomiting., Disp: 60 tablet, Rfl: 2   prochlorperazine (COMPAZINE) 5 MG tablet, Take 1 tablet (5 mg total) by mouth 3 (three) times daily before meals., Disp: 90 tablet, Rfl: 1   progesterone (PROMETRIUM) 200 MG capsule, 1 tablet daily at bedtime the first 10 days of each calendar month, Disp: 30 capsule, Rfl: 3   saccharomyces boulardii (FLORASTOR) 250 MG capsule, Take 1 capsule (250 mg total) by mouth 2 (two) times daily., Disp: 30 capsule, Rfl: 0   folic acid (FOLVITE)  1 MG tablet, Take 1 tablet (1 mg total) by mouth daily., Disp: 90 tablet, Rfl: 3   metoprolol tartrate (LOPRESSOR) 25 MG tablet, Take 1 tablet (25 mg total) by mouth 2 (two) times daily., Disp: 90 tablet, Rfl: 2   Allergies: Allergies  Allergen Reactions   Influenza Vaccines Anaphylaxis and Other (See Comments)    Per patient   Penicillins Anaphylaxis and Other (See Comments)   Articaine Other (See Comments)    Caused infection   Cortisone Other (See Comments)    Turned red and ran a low grade fever for 3 days   Other Rash    bandaids- skin turns red and a rash     REVIEW OF SYSTEMS:   Review of Systems  Constitutional:  Positive for fatigue and fever (per husband 100.5).  HENT:   Negative for sore throat.   Respiratory:  Positive for cough and shortness of breath.   Gastrointestinal:  Positive for nausea. Negative for abdominal pain, constipation, diarrhea and vomiting.  Genitourinary:  Positive for bladder incontinence. Negative for dysuria, frequency, hematuria, menstrual problem and vaginal bleeding.   Skin:  Positive for wound (On bottom).  Neurological:  Positive for extremity weakness. Negative for dizziness and headaches.  Psychiatric/Behavioral:  Positive for sleep disturbance. The patient is nervous/anxious.      VITALS:   There were no vitals taken for this visit.  Wt Readings from Last 3 Encounters:  07/20/23 233 lb (105.7 kg)  07/14/23 243 lb 14.4 oz (110.6 kg)  07/11/23 247 lb 5.7 oz (112.2 kg)    There is no height or weight on file to calculate BMI.  Performance status (ECOG): 2 - Symptomatic, <50% confined to bed  PHYSICAL EXAM:   Physical Exam Vitals reviewed.  Constitutional:      Appearance: Normal appearance.  Cardiovascular:     Rate and Rhythm: Normal rate and regular rhythm.  Pulmonary:     Effort: Pulmonary effort is normal.     Breath sounds: Decreased air movement present. Decreased breath sounds present. No wheezing or rhonchi.   Abdominal:     General: Bowel sounds are normal.  Neurological:     Mental Status: She is alert.     LABS:      Latest Ref Rng & Units 07/22/2023    8:43 AM 07/14/2023    9:06 AM 07/10/2023    4:17 AM  CBC  WBC 4.0 - 10.5 K/uL 1.6  10.3  10.8   Hemoglobin 12.0 - 15.0 g/dL 9.4  62.1  9.6  Hematocrit 36.0 - 46.0 % 30.4  33.2  31.6   Platelets 150 - 400 K/uL 159  395  357       Latest Ref Rng & Units 07/22/2023    8:43 AM 07/14/2023    9:06 AM 07/11/2023    3:55 AM  CMP  Glucose 70 - 99 mg/dL 161  096  81   BUN 8 - 23 mg/dL 9  7  6    Creatinine 0.44 - 1.00 mg/dL 0.45  4.09  8.11   Sodium 135 - 145 mmol/L 132  130  132   Potassium 3.5 - 5.1 mmol/L 2.8  3.3  3.6   Chloride 98 - 111 mmol/L 94  97  102   CO2 22 - 32 mmol/L 27  24  21    Calcium 8.9 - 10.3 mg/dL 7.9  7.8  7.8   Total Protein 6.5 - 8.1 g/dL 5.7  5.8    Total Bilirubin 0.3 - 1.2 mg/dL 0.6  0.5    Alkaline Phos 38 - 126 U/L 156  139    AST 15 - 41 U/L 30  14    ALT 0 - 44 U/L 30  14       No results found for: "CEA1", "CEA" / No results found for: "CEA1", "CEA" No results found for: "PSA1" No results found for: "BJY782" No results found for: "CAN125"  No results found for: "TOTALPROTELP", "ALBUMINELP", "A1GS", "A2GS", "BETS", "BETA2SER", "GAMS", "MSPIKE", "SPEI" No results found for: "TIBC", "FERRITIN", "IRONPCTSAT" No results found for: "LDH"   STUDIES:   ECHOCARDIOGRAM COMPLETE  Result Date: 07/08/2023    ECHOCARDIOGRAM REPORT   Patient Name:   Miranda Perkins Gratz Date of Exam: 07/08/2023 Medical Rec #:  [id number]          Height:       66.0 in Accession #:    [id number]         Weight:       237.4 lb Date of Birth:  08-18-1961          BSA:          2.151 m Patient Age:    62 years           BP:           11/71 mmHg Patient Gender: F                  HR:           103 bpm. [facility] Procedure: 2D Echo, Cardiac Doppler and Color Doppler Indications:    Swelling of lower extremity [[id number]]   History:        Patient has prior history of Echocardiogram examinations, most                 recent 01/24/2021. Lung Cancer and Stroke; Signs/Symptoms:Edema.                 Sepsis.  Sonographer:    Aron Baba Referring Phys: [id number] Onnie Boer  Sonographer Comments: Patient is obese. Image acquisition challenging due to respiratory motion. IMPRESSIONS  1. Left ventricular ejection fraction, by estimation, is >75%. The left ventricle has hyperdynamic function. The left ventricle has no regional wall motion abnormalities. Left ventricular diastolic parameters are indeterminate.  2. Right ventricular systolic function is normal. The right ventricular size is mildly enlarged. There is mildly elevated pulmonary artery systolic pressure.  3. The mitral valve is normal in structure. No  evidence of mitral valve regurgitation. No evidence of mitral stenosis.  4. The tricuspid valve is abnormal.  5. The aortic valve is tricuspid. Aortic valve regurgitation is not visualized. No aortic stenosis is present.  6. IVC is small suggesting low RA pressure and hypovolemia. FINDINGS  Left Ventricle: Hyperdynamic LV function creates a mild dynamic LVOT gradient, peak gradient 15 mmHg. Left ventricular ejection fraction, by estimation, is >75%. The left ventricle has hyperdynamic function. The left ventricle has no regional wall motion abnormalities. The left ventricular internal cavity size was normal in size. There is no left ventricular hypertrophy. Left ventricular diastolic parameters are indeterminate. Right Ventricle: The right ventricular size is mildly enlarged. Right vetricular wall thickness was not well visualized. Right ventricular systolic function is normal. There is mildly elevated pulmonary artery systolic pressure. The tricuspid regurgitant  velocity is 2.87 m/s, and with an assumed right atrial pressure of 8 mmHg, the estimated right ventricular systolic pressure is 40.9 mmHg. Left Atrium: Left atrial size  was normal in size. Right Atrium: Right atrial size was normal in size. Pericardium: There is no evidence of pericardial effusion. Mitral Valve: The mitral valve is normal in structure. No evidence of mitral valve regurgitation. No evidence of mitral valve stenosis. Tricuspid Valve: The tricuspid valve is abnormal. Tricuspid valve regurgitation is mild . No evidence of tricuspid stenosis. Aortic Valve: The aortic valve is tricuspid. Aortic valve regurgitation is not visualized. No aortic stenosis is present. Aortic valve mean gradient measures 6.9 mmHg. Aortic valve peak gradient measures 12.6 mmHg. Aortic valve area, by VTI measures 2.31  cm. Pulmonic Valve: The pulmonic valve was not well visualized. Pulmonic valve regurgitation is not visualized. No evidence of pulmonic stenosis. Aorta: The aortic root is normal in size and structure. Venous: IVC is small suggesting low RA pressure and hypovolemia. IAS/Shunts: No atrial level shunt detected by color flow Doppler.  LEFT VENTRICLE PLAX 2D LVIDd:         4.20 cm   Diastology LVIDs:         2.40 cm   LV e' medial:    9.95 cm/s LV PW:         0.90 cm   LV E/e' medial:  7.4 LV IVS:        0.90 cm   LV e' lateral:   13.50 cm/s LVOT diam:     1.90 cm   LV E/e' lateral: 5.4 LV SV:         61 LV SV Index:   28 LVOT Area:     2.84 cm  RIGHT VENTRICLE RV S prime:     26.70 cm/s TAPSE (M-mode): 2.4 cm LEFT ATRIUM           Index        RIGHT ATRIUM           Index LA diam:      2.80 cm 1.30 cm/m   RA Area:     13.80 cm LA Vol (A2C): 15.1 ml 7.02 ml/m   RA Volume:   33.80 ml  15.71 ml/m LA Vol (A4C): 37.4 ml 17.39 ml/m  AORTIC VALVE AV Area (Vmax):    2.03 cm AV Area (Vmean):   2.00 cm AV Area (VTI):     2.31 cm AV Vmax:           177.14 cm/s AV Vmean:          120.945 cm/s AV VTI:  0.265 m AV Peak Grad:      12.6 mmHg AV Mean Grad:      6.9 mmHg LVOT Vmax:         127.00 cm/s LVOT Vmean:        85.300 cm/s LVOT VTI:          0.216 m LVOT/AV VTI ratio: 0.81   AORTA Ao Root diam: 2.90 cm Ao Asc diam:  3.20 cm MITRAL VALVE               TRICUSPID VALVE MV Area (PHT): 4.29 cm    TR Peak grad:   32.9 mmHg MV Decel Time: 177 msec    TR Vmax:        287.00 cm/s MR Peak grad: 4.9 mmHg MR Vmax:      111.00 cm/s  SHUNTS MV E velocity: 73.30 cm/s  Systemic VTI:  0.22 m MV A velocity: 84.20 cm/s  Systemic Diam: 1.90 cm MV E/A ratio:  0.87 Dina Rich MD Electronically signed by Dina Rich MD Signature Date/Time: 07/08/2023/9:47:10 AM    Final    US Venous Img Lower Bilateral (DVT)  Result Date: 07/07/2023 CLINICAL DATA:  Bilateral lower extremity edema EXAM: BILATERAL LOWER EXTREMITY VENOUS DOPPLER ULTRASOUND TECHNIQUE: Gray-scale sonography with graded compression, as well as color Doppler and duplex ultrasound were performed to evaluate the lower extremity deep venous systems from the level of the common femoral vein and including the common femoral, femoral, profunda femoral, popliteal and calf veins including the posterior tibial, peroneal and gastrocnemius veins when visible. The superficial great saphenous vein was also interrogated. Spectral Doppler was utilized to evaluate flow at rest and with distal augmentation maneuvers in the common femoral, femoral and popliteal veins. COMPARISON:  None Available. FINDINGS: RIGHT LOWER EXTREMITY Common Femoral Vein: No evidence of thrombus. Normal compressibility, respiratory phasicity and response to augmentation. Saphenofemoral Junction: No evidence of thrombus. Normal compressibility and flow on color Doppler imaging. Profunda Femoral Vein: No evidence of thrombus. Normal compressibility and flow on color Doppler imaging. Femoral Vein: No evidence of thrombus. Normal compressibility, respiratory phasicity and response to augmentation. Popliteal Vein: No evidence of thrombus. Normal compressibility, respiratory phasicity and response to augmentation. Calf Veins: No evidence of thrombus. Normal compressibility and flow on  color Doppler imaging. Superficial Great Saphenous Vein: No evidence of thrombus. Normal compressibility. Venous Reflux:  None. Other Findings:  None. LEFT LOWER EXTREMITY Common Femoral Vein: No evidence of thrombus. Normal compressibility, respiratory phasicity and response to augmentation. Saphenofemoral Junction: No evidence of thrombus. Normal compressibility and flow on color Doppler imaging. Profunda Femoral Vein: No evidence of thrombus. Normal compressibility and flow on color Doppler imaging. Femoral Vein: No evidence of thrombus. Normal compressibility, respiratory phasicity and response to augmentation. Popliteal Vein: No evidence of thrombus. Normal compressibility, respiratory phasicity and response to augmentation. Calf Veins: No evidence of thrombus. Normal compressibility and flow on color Doppler imaging. Superficial Great Saphenous Vein: No evidence of thrombus. Normal compressibility. Venous Reflux:  None. Other Findings:  None. IMPRESSION: No evidence of deep venous thrombosis in either lower extremity. Electronically Signed   By: Malachy Moan M.D.   On: 07/07/2023 13:28   CT Angio Chest PE W and/or Wo Contrast  Result Date: 07/06/2023 CLINICAL DATA:  Sepsis; Pulmonary embolism (PE) suspected, high prob. Shortness of breath. Edema. History of lung carcinoma. Urinary incontinence. Burning with urination. * Tracking Code: BO * EXAM: CT ANGIOGRAPHY CHEST CT ABDOMEN AND PELVIS WITH CONTRAST TECHNIQUE: Multidetector CT imaging of  the chest was performed using the standard protocol during bolus administration of intravenous contrast. Multiplanar CT image reconstructions and MIPs were obtained to evaluate the vascular anatomy. Multidetector CT imaging of the abdomen and pelvis was performed using the standard protocol during bolus administration of intravenous contrast. RADIATION DOSE REDUCTION: This exam was performed according to the departmental dose-optimization program which includes  automated exposure control, adjustment of the mA and/or kV according to patient size and/or use of iterative reconstruction technique. CONTRAST:  OMNIPAQUE IOHEXOL 350 MG/ML SOLN COMPARISON:  CT scan abdomen and pelvis from 06/11/2023 and CT angiography chest from 05/20/2023. FINDINGS: CTA CHEST FINDINGS Cardiovascular: No evidence of embolism to the proximal subsegmental pulmonary artery level. Please note, left lung lower lobe pulmonary artery is compressed by patient's pre-existing left lung lower lobe mass however, there is no embolism within. Normal cardiac size. No pericardial effusion. No aortic aneurysm. Mediastinum/Nodes: Visualized thyroid gland appears grossly unremarkable. No solid / cystic mediastinal masses. The esophagus is nondistended precluding optimal assessment. No axillary, mediastinal or hilar lymphadenopathy by size criteria. Lungs/Pleura: The trachea and right bronchial tree is patent. There is abrupt cutoff of left lower lobe bronchial tree. There is near complete collapse of the left lung lower lobe except few small areas of aeration of the superior segment, posteriorly. There is heterogeneous left mass however, accurate measurement is difficult on this exam however, there is significant interval increase in the size when compared to the prior exam from 05/20/2023. There is resultant postobstructive pneumonia versus atelectasis of the left lung lower lobe. There is small right pleural effusion, new since the prior study. There are associated compressive atelectatic changes in the right lung lower lobe. No right lung mass or consolidation. No suspicious lung nodules. Musculoskeletal: A CT Port-a-Cath is seen in the left upper chest wall with the catheter terminating in the cavo-atrial junction region. Visualized soft tissues of the chest wall are otherwise grossly unremarkable. No suspicious osseous lesions. There are mild multilevel degenerative changes in the visualized spine. Review  of the MIP images confirms the above findings. CT ABDOMEN and PELVIS FINDINGS Hepatobiliary: The liver is normal in size. Non-cirrhotic configuration. No suspicious mass. No intrahepatic or extrahepatic bile duct dilation. No calcified gallstones. Normal gallbladder wall thickness. No pericholecystic inflammatory changes. Pancreas: Unremarkable. No pancreatic ductal dilatation or surrounding inflammatory changes. Spleen: Within normal limits. No focal lesion. Adrenals/Urinary Tract: Redemonstration of markedly enlarged right adrenal gland measuring 8.1 x 10.3 cm, similar to the prior study. There is hyperattenuating peripheral wall with central hypoattenuating areas and intervening hyperattenuating septations. No significant interval change since the prior study. Unremarkable left adrenal gland. No suspicious renal mass. There is a stable partially exophytic 5.2 x 7.9 cm cyst arising from the right kidney upper pole, posteriorly. No hydronephrosis. No renal or ureteric calculi. Unremarkable urinary bladder. Stomach/Bowel: No disproportionate dilation of the small or large bowel loops. No evidence of abnormal bowel wall thickening or inflammatory changes. The appendix was not confidently visualized; however there is no acute inflammatory process in the right lower quadrant. Vascular/Lymphatic: No ascites or pneumoperitoneum. No abdominal or pelvic lymphadenopathy, by size criteria. No aneurysmal dilation of the major abdominal arteries. There are mild peripheral atherosclerotic vascular calcifications of the aorta and its major branches. Reproductive: Normal-size anteverted uterus. Redemonstration of thickened endometrium measuring up to 9 mm, without discrete focal mass. No large adnexal mass. Bilateral fallopian tube closure devices noted. Other: There is a tiny fat containing umbilical hernia. The soft tissues and abdominal  wall are otherwise unremarkable. Musculoskeletal: No suspicious osseous lesions. There are  mild multilevel degenerative changes in the visualized spine. Review of the MIP images confirms the above findings. IMPRESSION: 1. No evidence of pulmonary embolism to the proximal subsegmental pulmonary artery level. 2. Interval increase in size of left lung lower lobe mass with resultant postobstructive pneumonia versus atelectasis of the left lung lower lobe. Increasing right pleural effusion. 3. Stable right adrenal mass. 4. Stable thickened endometrium.  No focal mass. 5. Multiple other nonacute observations, as described above. Electronically Signed   By: Jules Schick M.D.   On: 07/06/2023 17:15   CT ABDOMEN PELVIS W CONTRAST  Result Date: 07/06/2023 CLINICAL DATA:  Sepsis; Pulmonary embolism (PE) suspected, high prob. Shortness of breath. Edema. History of lung carcinoma. Urinary incontinence. Burning with urination. * Tracking Code: BO * EXAM: CT ANGIOGRAPHY CHEST CT ABDOMEN AND PELVIS WITH CONTRAST TECHNIQUE: Multidetector CT imaging of the chest was performed using the standard protocol during bolus administration of intravenous contrast. Multiplanar CT image reconstructions and MIPs were obtained to evaluate the vascular anatomy. Multidetector CT imaging of the abdomen and pelvis was performed using the standard protocol during bolus administration of intravenous contrast. RADIATION DOSE REDUCTION: This exam was performed according to the departmental dose-optimization program which includes automated exposure control, adjustment of the mA and/or kV according to patient size and/or use of iterative reconstruction technique. CONTRAST:  OMNIPAQUE IOHEXOL 350 MG/ML SOLN COMPARISON:  CT scan abdomen and pelvis from 06/11/2023 and CT angiography chest from 05/20/2023. FINDINGS: CTA CHEST FINDINGS Cardiovascular: No evidence of embolism to the proximal subsegmental pulmonary artery level. Please note, left lung lower lobe pulmonary artery is compressed by patient's pre-existing left lung lower lobe  mass however, there is no embolism within. Normal cardiac size. No pericardial effusion. No aortic aneurysm. Mediastinum/Nodes: Visualized thyroid gland appears grossly unremarkable. No solid / cystic mediastinal masses. The esophagus is nondistended precluding optimal assessment. No axillary, mediastinal or hilar lymphadenopathy by size criteria. Lungs/Pleura: The trachea and right bronchial tree is patent. There is abrupt cutoff of left lower lobe bronchial tree. There is near complete collapse of the left lung lower lobe except few small areas of aeration of the superior segment, posteriorly. There is heterogeneous left mass however, accurate measurement is difficult on this exam however, there is significant interval increase in the size when compared to the prior exam from 05/20/2023. There is resultant postobstructive pneumonia versus atelectasis of the left lung lower lobe. There is small right pleural effusion, new since the prior study. There are associated compressive atelectatic changes in the right lung lower lobe. No right lung mass or consolidation. No suspicious lung nodules. Musculoskeletal: A CT Port-a-Cath is seen in the left upper chest wall with the catheter terminating in the cavo-atrial junction region. Visualized soft tissues of the chest wall are otherwise grossly unremarkable. No suspicious osseous lesions. There are mild multilevel degenerative changes in the visualized spine. Review of the MIP images confirms the above findings. CT ABDOMEN and PELVIS FINDINGS Hepatobiliary: The liver is normal in size. Non-cirrhotic configuration. No suspicious mass. No intrahepatic or extrahepatic bile duct dilation. No calcified gallstones. Normal gallbladder wall thickness. No pericholecystic inflammatory changes. Pancreas: Unremarkable. No pancreatic ductal dilatation or surrounding inflammatory changes. Spleen: Within normal limits. No focal lesion. Adrenals/Urinary Tract: Redemonstration of markedly  enlarged right adrenal gland measuring 8.1 x 10.3 cm, similar to the prior study. There is hyperattenuating peripheral wall with central hypoattenuating areas and intervening hyperattenuating septations. No  significant interval change since the prior study. Unremarkable left adrenal gland. No suspicious renal mass. There is a stable partially exophytic 5.2 x 7.9 cm cyst arising from the right kidney upper pole, posteriorly. No hydronephrosis. No renal or ureteric calculi. Unremarkable urinary bladder. Stomach/Bowel: No disproportionate dilation of the small or large bowel loops. No evidence of abnormal bowel wall thickening or inflammatory changes. The appendix was not confidently visualized; however there is no acute inflammatory process in the right lower quadrant. Vascular/Lymphatic: No ascites or pneumoperitoneum. No abdominal or pelvic lymphadenopathy, by size criteria. No aneurysmal dilation of the major abdominal arteries. There are mild peripheral atherosclerotic vascular calcifications of the aorta and its major branches. Reproductive: Normal-size anteverted uterus. Redemonstration of thickened endometrium measuring up to 9 mm, without discrete focal mass. No large adnexal mass. Bilateral fallopian tube closure devices noted. Other: There is a tiny fat containing umbilical hernia. The soft tissues and abdominal wall are otherwise unremarkable. Musculoskeletal: No suspicious osseous lesions. There are mild multilevel degenerative changes in the visualized spine. Review of the MIP images confirms the above findings. IMPRESSION: 1. No evidence of pulmonary embolism to the proximal subsegmental pulmonary artery level. 2. Interval increase in size of left lung lower lobe mass with resultant postobstructive pneumonia versus atelectasis of the left lung lower lobe. Increasing right pleural effusion. 3. Stable right adrenal mass. 4. Stable thickened endometrium.  No focal mass. 5. Multiple other nonacute  observations, as described above. Electronically Signed   By: Jules Schick M.D.   On: 07/06/2023 17:15   DG Chest Portable 1 View  Result Date: 07/06/2023 CLINICAL DATA:  Cough and swelling EXAM: PORTABLE CHEST 1 VIEW COMPARISON:  X-ray 06/24/2023 FINDINGS: Left upper chest port. Tip along the central SVC. Stable cardiopericardial silhouette. No pneumothorax or effusion. Developing left retrocardiac opacity. New infiltrates possible. Stable left perihilar mass and possible additional nodular area in the medial right lung base as on prior. Please correlate with prior workup. Overlapping cardiac leads. Fixation hardware along the lower cervical spine at the edge of the imaging field. IMPRESSION: Developing left retrocardiac opacity.  Possible new infiltrate. Stable left perihilar lung mass with chest port Electronically Signed   By: Karen Kays M.D.   On: 07/06/2023 16:38   DG Chest Port 1 View  Result Date: 06/24/2023 CLINICAL DATA:  Port-A-Cath placement EXAM: PORTABLE CHEST 1 VIEW COMPARISON:  06/14/2023 FINDINGS: Left Port-A-Cath tip at low SVC 2 superior caval/atrial junction. Right hemidiaphragm elevation.  Cervical spine fixation. Patient rotated left. Midline trachea. Normal heart size. No pleural effusion or pneumothorax. Left perihilar mass measures 6.1 cm today versus 5.5 cm on 06/14/2023. IMPRESSION: Left Port-A-Cath terminates at the low SVC or superior caval/atrial junction, without pneumothorax. Left perihilar mass measures slightly larger today than on 06/04/2023. Difference is favored to be due to mild obliquity. Electronically Signed   By: Jeronimo Greaves M.D.   On: 06/24/2023 19:10   DG C-Arm 1-60 Min-No Report  Result Date: 06/24/2023 Fluoroscopy was utilized by the requesting physician.  No radiographic interpretation.

## 2023-07-22 NOTE — Progress Notes (Signed)
Patient presents today for follow up visit with Hetty Ely NP and possible IV fluids. Potassium 2.8. Orders received from Hetty Ely NP to infuse 40 mEq of IV potassium in 1 liter of normal saline. Patient unable to take potassium pills at home.   40 mEq of Potassium Chloride given today per MD orders. Tolerated infusion without adverse affects. Vital signs stable. No complaints at this time. Discharged from clinic by wheel chair in stable condition. Alert and oriented x 3. F/U with [facility] as scheduled.

## 2023-07-22 NOTE — Patient Instructions (Signed)
[facility]  Discharge Instructions: Thank you for choosing [facility] to provide your oncology and hematology care.  If you have a lab appointment with the Cancer Center - please note that after April 8th, 2024, all labs will be drawn in the cancer center.  You do not have to check in or register with the main entrance as you have in the past but will complete your check-in in the cancer center.  Wear comfortable clothing and clothing appropriate for easy access to any Portacath or PICC line.   We strive to give you quality time with your provider. You may need to reschedule your appointment if you arrive late (15 or more minutes).  Arriving late affects you and other patients whose appointments are after yours.  Also, if you miss three or more appointments without notifying the office, you may be dismissed from the clinic at the provider's discretion.      For prescription refill requests, have your pharmacy contact our office and allow 72 hours for refills to be completed.    Today you received the following chemotherapy and/or immunotherapy agents: 40 mEq of potassium IV.       To help prevent nausea and vomiting after your treatment, we encourage you to take your nausea medication as directed.  BELOW ARE SYMPTOMS THAT SHOULD BE REPORTED IMMEDIATELY: *FEVER GREATER THAN 100.4 F (38 C) OR HIGHER *CHILLS OR SWEATING *NAUSEA AND VOMITING THAT IS NOT CONTROLLED WITH YOUR NAUSEA MEDICATION *UNUSUAL SHORTNESS OF BREATH *UNUSUAL BRUISING OR BLEEDING *URINARY PROBLEMS (pain or burning when urinating, or frequent urination) *BOWEL PROBLEMS (unusual diarrhea, constipation, pain near the anus) TENDERNESS IN MOUTH AND THROAT WITH OR WITHOUT PRESENCE OF ULCERS (sore throat, sores in mouth, or a toothache) UNUSUAL RASH, SWELLING OR PAIN  UNUSUAL VAGINAL DISCHARGE OR ITCHING   Items with * indicate a potential emergency and should be followed up as soon as  possible or go to the Emergency Department if any problems should occur.  Please show the CHEMOTHERAPY ALERT CARD or IMMUNOTHERAPY ALERT CARD at check-in to the Emergency Department and triage nurse.  Should you have questions after your visit or need to cancel or reschedule your appointment, please contact [facility] [phone]  and follow the prompts.  Office hours are 8:00 a.m. to 4:30 p.m. Monday - Friday. Please note that voicemails left after 4:00 p.m. may not be returned until the following business day.  We are closed weekends and major holidays. You have access to a nurse at all times for urgent questions. Please call the main number to the clinic [phone] and follow the prompts.  For any non-urgent questions, you may also contact your provider using MyChart. We now offer e-Visits for anyone 40 and older to request care online for non-urgent symptoms. For details visit [email or web address].   Also download the MyChart app! Go to the app store, search "MyChart", open the app, select [place], and log in with your MyChart username and password.

## 2023-07-22 NOTE — Progress Notes (Signed)
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 Pt has completed 4/5 planned fractions to right adrenal under the care of Dr. Langston Masker (final 8/30). Patient currently receiving carboplatin, yervoy + opdivo (first 9/11).  9/3-9/8 [facility] admission with PNA, BLE swelling 8/18-8/24 [facility] admission with ileus, persistent C diff 8/9-8/11 [facility] admission with intractable N/V  Patient sleeping soundly in infusion. She is receiving IV fluids today. Husband in room. Reports pt slowly improving. He has been preparing her smoothies and adding Boost. She is eating a few bites of food several times daily. She has finished antibiotics. Diarrhea has resolved. Nausea well controlled with antiemetics.   Medications: folvite, megace, lopressor (9/19)  Labs: Na 132, K 2.8, albumin 2.3  Anthropometrics: Wt 233 lb on 9/17  9/11 - 243 lb 14.4 oz 9/04 - 237 lb 7 oz 8/18 - 227 lb 1.2 oz    NUTRITION DIAGNOSIS: Unintended wt loss - continues (lasix noted)   INTERVENTION:  Continue adding Boost to smoothies for added calories and protein, recommend drinking 2/day Continue working to increase daily po with small frequent meals/snacks  Appetite stimulant (megace) ordered today per NP Continue antiemetics for nausea    MONITORING, EVALUATION, GOAL: wt trends, intake   NEXT VISIT: Thursday October 3 via telephone

## 2023-07-27 ENCOUNTER — Ambulatory Visit

## 2023-07-27 ENCOUNTER — Telehealth: Payer: Self-pay | Admitting: *Deleted

## 2023-07-27 DIAGNOSIS — J181 Lobar pneumonia, unspecified organism: Secondary | ICD-10-CM | POA: Diagnosis not present

## 2023-07-27 DIAGNOSIS — C3492 Malignant neoplasm of unspecified part of left bronchus or lung: Secondary | ICD-10-CM

## 2023-07-27 DIAGNOSIS — D630 Anemia in neoplastic disease: Secondary | ICD-10-CM

## 2023-07-27 DIAGNOSIS — E876 Hypokalemia: Secondary | ICD-10-CM

## 2023-07-27 DIAGNOSIS — Z6841 Body Mass Index (BMI) 40.0 and over, adult: Secondary | ICD-10-CM

## 2023-07-27 DIAGNOSIS — Z792 Long term (current) use of antibiotics: Secondary | ICD-10-CM

## 2023-07-27 DIAGNOSIS — A419 Sepsis, unspecified organism: Secondary | ICD-10-CM | POA: Diagnosis not present

## 2023-07-27 DIAGNOSIS — A0472 Enterocolitis due to Clostridium difficile, not specified as recurrent: Secondary | ICD-10-CM

## 2023-07-27 NOTE — Telephone Encounter (Signed)
EKGs in the cone system show normal sinus rhythm and some mild sinus tachycardia. Given she had pneumonia and sepsis during recent admission would expect heart rates to be elevated in that scenario.   Dominga Ferry MD

## 2023-07-27 NOTE — Addendum Note (Signed)
Addended by: Cynda Acres A on: 07/27/2023 01:48 PM   Modules accepted: Orders

## 2023-07-27 NOTE — Telephone Encounter (Addendum)
Per Samara Deist NP with Palliative care, patient had not been taking 40 mg of lasix as recommended by Dr. Ellin Saba.  States that she has a large amount of edema in her lower extremities.  She has suggested that she resume as prescribed.  If edema has not improved by Friday, she needs to contact Dr. [name] for recommendations.  Apparently megace has not effective in increasing her appetite.  Samara Deist briefly spoke to them about the possibility of Dr. Ellin Saba considering Remeron at next visit.  Discussed with her daughter Efraim Kaufmann who verbalized understanding of everything that was discussed at her home visit today.

## 2023-07-28 ENCOUNTER — Telehealth (INDEPENDENT_AMBULATORY_CARE_PROVIDER_SITE_OTHER): Admitting: Family Medicine

## 2023-07-28 ENCOUNTER — Telehealth: Payer: Self-pay | Admitting: *Deleted

## 2023-07-28 ENCOUNTER — Encounter: Payer: Self-pay | Admitting: Family Medicine

## 2023-07-28 DIAGNOSIS — R058 Other specified cough: Secondary | ICD-10-CM | POA: Diagnosis not present

## 2023-07-28 DIAGNOSIS — R000 Tachycardia, unspecified: Secondary | ICD-10-CM | POA: Diagnosis not present

## 2023-07-28 DIAGNOSIS — C3492 Malignant neoplasm of unspecified part of left bronchus or lung: Secondary | ICD-10-CM

## 2023-07-28 DIAGNOSIS — R0609 Other forms of dyspnea: Secondary | ICD-10-CM

## 2023-07-28 MED ORDER — ALBUTEROL SULFATE (2.5 MG/3ML) 0.083% IN NEBU: 2.5000 mg | INHALATION_SOLUTION | Freq: Four times a day (QID) | RESPIRATORY_TRACT | 1 refills | Status: DC | PRN

## 2023-07-28 NOTE — Progress Notes (Signed)
Virtual Visit via Video   I connected with patient on 07/28/23 at 1335 by a video enabled telemedicine application and verified that I am speaking with the correct person using two identifiers.  Location patient: Home Location provider: [facility] Persons participating in the virtual visit: Patient and Provider  I discussed the limitations of evaluation and management by telemedicine and the availability of in person appointments. The patient expressed understanding and agreed to proceed.  Subjective:   HPI:  Pt presents today for  Chief Complaint  Patient presents with   Shortness of Breath   Pt presents today with daughter for increased shortness of breath which worsens at night. She is on palliative care for metastatic lung cancer. Daughter reports palliative care advised her to take her mother to the ED for evaluation but pt would like to try treatments at home before going to the hospital. She has been using albuterol inhaler without complete relief of symptoms. VS at home: HR 100, BP 130/73, pulse ox 96%. Daughter reports pulse ox has not dropped below 90%. No fever, increased weakness, or confusion. She has been tachycardic at times and is on metoprolol for the tachycardia. Has not increased dosing.      Review of Systems  Constitutional:  Positive for malaise/fatigue. Negative for chills, diaphoresis, fever and weight loss.  Respiratory:  Positive for cough and shortness of breath. Negative for hemoptysis and sputum production.   All other systems reviewed and are negative.    Patient Active Problem List   Diagnosis Date Noted   PNA (pneumonia) 07/06/2023   Severe sepsis (HCC) 07/06/2023   Metastatic primary lung cancer, left (HCC) 06/23/2023   Class 2 obesity due to excess calories with body mass index (BMI) of 36.0 to 36.9 in adult 06/22/2023   Leukocytosis 06/21/2023   Ileus (HCC) 06/20/2023   Pulmonary nodule 06/14/2023   Thyroid  nodule 06/11/2023   Intractable nausea and vomiting 06/11/2023   Hypokalemia 06/11/2023   Transaminitis 06/11/2023   recent history of C. difficile colitis 06/11/2023   Malignant neoplasm of unspecified part of unspecified bronchus or lung (HCC) 05/26/2023   Hilar mass 05/21/2023   Prediabetes 04/27/2023   Adrenal hemorrhage (HCC) 04/23/2023   Class 3 severe obesity due to excess calories with serious comorbidity and body mass index (BMI) of 40.0 to 44.9 in adult (HCC) 12/29/2021   DOE (dyspnea on exertion) 03/20/2021   Upper airway cough syndrome vs cough variant asthma 03/20/2021   Elevated testosterone level in female 12/20/2020   Female hirsutism 12/20/2020   Hx of total knee arthroplasty, left 01/26/2018   Spinal stenosis of cervical region 09/27/2017   Insomnia 09/14/2016   BMI 39.0-39.9,adult 09/14/2016   Swelling of both lower extremities 11/28/2015   Nontoxic multinodular goiter 11/28/2015   Vitamin B 12 deficiency 11/28/2015   Rosacea 11/28/2015    Social History   Tobacco Use   Smoking status: Former    Current packs/day: 0.00    Average packs/day: 0.5 packs/day for 35.0 years (17.5 ttl pk-yrs)    Types: Cigarettes    Start date: 11/26/1974    Quit date: 11/26/2009    Years since quitting: 13.6   Smokeless tobacco: Never  Substance Use Topics   Alcohol use: Not Currently    Current Outpatient Medications:    albuterol (PROVENTIL) (2.5 MG/3ML) 0.083% nebulizer solution, Take 3 mLs (2.5 mg total) by nebulization every 6 (six) hours as needed for wheezing or shortness of breath., Disp: 150  mL, Rfl: 1   cefadroxil (DURICEF) 1 g tablet, Take 1 tablet (1 g total) by mouth 2 (two) times daily., Disp: 10 tablet, Rfl: 0   folic acid (FOLVITE) 1 MG tablet, Take 1 tablet (1 mg total) by mouth daily., Disp: 90 tablet, Rfl: 3   furosemide (LASIX) 20 MG tablet, Take 1 tablet (20 mg total) by mouth daily. (Patient taking differently: Take 40 mg by mouth daily.), Disp: 30 tablet,  Rfl: 1   guaiFENesin (MUCINEX) 600 MG 12 hr tablet, Take 2 tablets (1,200 mg total) by mouth 2 (two) times daily., Disp: 20 tablet, Rfl: 0   lidocaine-prilocaine (EMLA) cream, Apply a quarter-sized amount to port a cath site and cover with plastic wrap 1 hour prior to infusion appointments, Disp: 30 g, Rfl: 0   lidocaine-prilocaine (EMLA) cream, Apply to affected area once, Disp: 30 g, Rfl: 3   megestrol (MEGACE) 400 MG/10ML suspension, Take 400 mg by mouth 2 (two) times daily., Disp: , Rfl:    metoprolol tartrate (LOPRESSOR) 25 MG tablet, Take 1 tablet (25 mg total) by mouth 2 (two) times daily., Disp: 90 tablet, Rfl: 2   metroNIDAZOLE (FLAGYL) 500 MG tablet, Take 1 tablet (500 mg total) by mouth 2 (two) times daily., Disp: 9 tablet, Rfl: 0   Multiple Vitamin (MULTIVITAMIN WITH MINERALS) TABS tablet, Take 1 tablet by mouth daily., Disp: 100 tablet, Rfl: 1   oxyCODONE (OXY IR/ROXICODONE) 5 MG immediate release tablet, Take 1 tablet (5 mg total) by mouth every 4 (four) hours as needed for moderate pain., Disp: 180 tablet, Rfl: 0   potassium chloride (KLOR-CON M) 10 MEQ tablet, Take 1 tablet (10 mEq total) by mouth daily., Disp: 30 tablet, Rfl: 0   prochlorperazine (COMPAZINE) 10 MG tablet, Take 1 tablet (10 mg total) by mouth every 6 (six) hours as needed for nausea or vomiting., Disp: 60 tablet, Rfl: 2   prochlorperazine (COMPAZINE) 5 MG tablet, Take 1 tablet (5 mg total) by mouth 3 (three) times daily before meals., Disp: 90 tablet, Rfl: 1   progesterone (PROMETRIUM) 200 MG capsule, 1 tablet daily at bedtime the first 10 days of each calendar month, Disp: 30 capsule, Rfl: 3   saccharomyces boulardii (FLORASTOR) 250 MG capsule, Take 1 capsule (250 mg total) by mouth 2 (two) times daily., Disp: 30 capsule, Rfl: 0  Allergies  Allergen Reactions   Influenza Vaccines Anaphylaxis and Other (See Comments)    Per patient   Penicillins Anaphylaxis and Other (See Comments)   Articaine Other (See Comments)     Caused infection   Cortisone Other (See Comments)    Turned red and ran a low grade fever for 3 days   Other Rash    bandaids- skin turns red and a rash     Objective:   There were no vitals taken for this visit.  Patient is well-developed, well-nourished in no acute distress.  Resting comfortably at home.  Head is normocephalic, atraumatic.  No labored breathing.  Speech is clear and coherent with logical content.  Patient is alert and oriented at baseline.  No respiratory distress noted.   Assessment and Plan:   Miranda "Dennie Bible" was seen today for shortness of breath.  Diagnoses and all orders for this visit:  Upper airway cough syndrome vs cough variant asthma DOE (dyspnea on exertion) Metastatic primary lung cancer, left (HCC) Will trial above. Daughter aware if this is not beneficial, pt may need to go to the ED for evaluation and treatment.  -  For home use only DME Nebulizer machine -     albuterol (PROVENTIL) (2.5 MG/3ML) 0.083% nebulizer solution; Take 3 mLs (2.5 mg total) by nebulization every 6 (six) hours as needed for wheezing or shortness of breath.   Tachycardia Discussed pill in the pocket approach with metoprolol. Aware to start with half a tablet, can dose with another half a tablet if warranted. Aware of red flags which require evaluation and treatment.      Return if symptoms worsen or fail to improve.  Kari Baars, FNP-C [facility] [address] [place], [place] [address] [phone]  07/28/2023  Time spent with the patient: 15 minutes, of which >50% was spent in obtaining information about symptoms, reviewing previous labs, evaluations, and treatments, counseling about condition (please see the discussed topics above), and developing a plan to further investigate it; had a number of questions which I addressed.

## 2023-07-28 NOTE — Telephone Encounter (Signed)
Received a call from daughter Efraim Kaufmann stating and edema has not improved despite taking lasix as prescribed. Is experiencing SOB and home O2 sats are 88% on room air.  Continues to have a cough, which has become chronic.  Has been running low grade fever of 99-100 over the past 3 days.  Advised to take her to ER for evaluation, along with collaboration with Palliative care nurse.  Verbalized understanding.

## 2023-07-29 ENCOUNTER — Encounter: Payer: Self-pay | Admitting: Family Medicine

## 2023-07-31 ENCOUNTER — Other Ambulatory Visit: Payer: Self-pay

## 2023-07-31 ENCOUNTER — Emergency Department (HOSPITAL_COMMUNITY)

## 2023-07-31 ENCOUNTER — Inpatient Hospital Stay (HOSPITAL_COMMUNITY)
Admission: EM | Admit: 2023-07-31 | Discharge: 2023-08-07 | DRG: 871 | Disposition: A | Discharge status: Home Health | Attending: Family Medicine | Admitting: Family Medicine

## 2023-07-31 ENCOUNTER — Encounter (HOSPITAL_COMMUNITY): Payer: Self-pay | Admitting: *Deleted

## 2023-07-31 DIAGNOSIS — J9601 Acute respiratory failure with hypoxia: Secondary | ICD-10-CM | POA: Diagnosis present

## 2023-07-31 DIAGNOSIS — E876 Hypokalemia: Secondary | ICD-10-CM | POA: Diagnosis present

## 2023-07-31 DIAGNOSIS — C7971 Secondary malignant neoplasm of right adrenal gland: Secondary | ICD-10-CM | POA: Diagnosis present

## 2023-07-31 DIAGNOSIS — E8809 Other disorders of plasma-protein metabolism, not elsewhere classified: Secondary | ICD-10-CM | POA: Diagnosis present

## 2023-07-31 DIAGNOSIS — Z8673 Personal history of transient ischemic attack (TIA), and cerebral infarction without residual deficits: Secondary | ICD-10-CM

## 2023-07-31 DIAGNOSIS — T451X5A Adverse effect of antineoplastic and immunosuppressive drugs, initial encounter: Secondary | ICD-10-CM | POA: Diagnosis present

## 2023-07-31 DIAGNOSIS — U099 Post covid-19 condition, unspecified: Secondary | ICD-10-CM | POA: Diagnosis present

## 2023-07-31 DIAGNOSIS — Z9851 Tubal ligation status: Secondary | ICD-10-CM

## 2023-07-31 DIAGNOSIS — Z1152 Encounter for screening for COVID-19: Secondary | ICD-10-CM

## 2023-07-31 DIAGNOSIS — R6520 Severe sepsis without septic shock: Secondary | ICD-10-CM | POA: Diagnosis present

## 2023-07-31 DIAGNOSIS — Z880 Allergy status to penicillin: Secondary | ICD-10-CM

## 2023-07-31 DIAGNOSIS — Y95 Nosocomial condition: Secondary | ICD-10-CM | POA: Diagnosis present

## 2023-07-31 DIAGNOSIS — R058 Other specified cough: Secondary | ICD-10-CM

## 2023-07-31 DIAGNOSIS — Z79899 Other long term (current) drug therapy: Secondary | ICD-10-CM

## 2023-07-31 DIAGNOSIS — R0602 Shortness of breath: Secondary | ICD-10-CM | POA: Diagnosis present

## 2023-07-31 DIAGNOSIS — Z85819 Personal history of malignant neoplasm of unspecified site of lip, oral cavity, and pharynx: Secondary | ICD-10-CM

## 2023-07-31 DIAGNOSIS — M7989 Other specified soft tissue disorders: Secondary | ICD-10-CM | POA: Diagnosis present

## 2023-07-31 DIAGNOSIS — E440 Moderate protein-calorie malnutrition: Secondary | ICD-10-CM | POA: Diagnosis present

## 2023-07-31 DIAGNOSIS — M8580 Other specified disorders of bone density and structure, unspecified site: Secondary | ICD-10-CM | POA: Diagnosis present

## 2023-07-31 DIAGNOSIS — A419 Sepsis, unspecified organism: Principal | ICD-10-CM | POA: Diagnosis present

## 2023-07-31 DIAGNOSIS — Z981 Arthrodesis status: Secondary | ICD-10-CM

## 2023-07-31 DIAGNOSIS — D84821 Immunodeficiency due to drugs: Secondary | ICD-10-CM | POA: Diagnosis present

## 2023-07-31 DIAGNOSIS — J189 Pneumonia, unspecified organism: Principal | ICD-10-CM | POA: Diagnosis present

## 2023-07-31 DIAGNOSIS — Z85118 Personal history of other malignant neoplasm of bronchus and lung: Secondary | ICD-10-CM

## 2023-07-31 DIAGNOSIS — E871 Hypo-osmolality and hyponatremia: Secondary | ICD-10-CM | POA: Diagnosis present

## 2023-07-31 DIAGNOSIS — A0471 Enterocolitis due to Clostridium difficile, recurrent: Secondary | ICD-10-CM | POA: Diagnosis present

## 2023-07-31 DIAGNOSIS — R0609 Other forms of dyspnea: Secondary | ICD-10-CM

## 2023-07-31 DIAGNOSIS — D638 Anemia in other chronic diseases classified elsewhere: Secondary | ICD-10-CM | POA: Diagnosis present

## 2023-07-31 DIAGNOSIS — E66812 Obesity, class 2: Secondary | ICD-10-CM | POA: Diagnosis present

## 2023-07-31 DIAGNOSIS — I959 Hypotension, unspecified: Secondary | ICD-10-CM | POA: Diagnosis not present

## 2023-07-31 DIAGNOSIS — J181 Lobar pneumonia, unspecified organism: Secondary | ICD-10-CM | POA: Diagnosis not present

## 2023-07-31 DIAGNOSIS — E8770 Fluid overload, unspecified: Secondary | ICD-10-CM | POA: Diagnosis present

## 2023-07-31 DIAGNOSIS — Z66 Do not resuscitate: Secondary | ICD-10-CM | POA: Diagnosis present

## 2023-07-31 DIAGNOSIS — Z96652 Presence of left artificial knee joint: Secondary | ICD-10-CM | POA: Diagnosis present

## 2023-07-31 DIAGNOSIS — Z87891 Personal history of nicotine dependence: Secondary | ICD-10-CM

## 2023-07-31 DIAGNOSIS — C3492 Malignant neoplasm of unspecified part of left bronchus or lung: Secondary | ICD-10-CM | POA: Diagnosis present

## 2023-07-31 DIAGNOSIS — Z923 Personal history of irradiation: Secondary | ICD-10-CM

## 2023-07-31 DIAGNOSIS — Z6836 Body mass index (BMI) 36.0-36.9, adult: Secondary | ICD-10-CM

## 2023-07-31 DIAGNOSIS — G4700 Insomnia, unspecified: Secondary | ICD-10-CM | POA: Diagnosis present

## 2023-07-31 DIAGNOSIS — Z888 Allergy status to other drugs, medicaments and biological substances status: Secondary | ICD-10-CM

## 2023-07-31 DIAGNOSIS — Z832 Family history of diseases of the blood and blood-forming organs and certain disorders involving the immune mechanism: Secondary | ICD-10-CM

## 2023-07-31 DIAGNOSIS — L8992 Pressure ulcer of unspecified site, stage 2: Secondary | ICD-10-CM | POA: Diagnosis not present

## 2023-07-31 DIAGNOSIS — L89322 Pressure ulcer of left buttock, stage 2: Secondary | ICD-10-CM | POA: Diagnosis present

## 2023-07-31 DIAGNOSIS — Z85828 Personal history of other malignant neoplasm of skin: Secondary | ICD-10-CM

## 2023-07-31 DIAGNOSIS — E669 Obesity, unspecified: Secondary | ICD-10-CM | POA: Insufficient documentation

## 2023-07-31 DIAGNOSIS — L8990 Pressure ulcer of unspecified site, unspecified stage: Secondary | ICD-10-CM | POA: Insufficient documentation

## 2023-07-31 DIAGNOSIS — Z887 Allergy status to serum and vaccine status: Secondary | ICD-10-CM

## 2023-07-31 LAB — CBC WITH DIFFERENTIAL/PLATELET
Abs Immature Granulocytes: 0.06 10*3/uL (ref 0.00–0.07)
Basophils Absolute: 0 10*3/uL (ref 0.0–0.1)
Basophils Relative: 0 %
Eosinophils Absolute: 0 10*3/uL (ref 0.0–0.5)
Eosinophils Relative: 0 %
HCT: 30.9 % — ABNORMAL LOW (ref 36.0–46.0)
Hemoglobin: 9.4 g/dL — ABNORMAL LOW (ref 12.0–15.0)
Immature Granulocytes: 1 %
Lymphocytes Relative: 21 %
Lymphs Abs: 1.5 10*3/uL (ref 0.7–4.0)
MCH: 24.7 pg — ABNORMAL LOW (ref 26.0–34.0)
MCHC: 30.4 g/dL (ref 30.0–36.0)
MCV: 81.3 fL (ref 80.0–100.0)
Monocytes Absolute: 0.8 10*3/uL (ref 0.1–1.0)
Monocytes Relative: 12 %
Neutro Abs: 4.6 10*3/uL (ref 1.7–7.7)
Neutrophils Relative %: 66 %
Platelets: 471 10*3/uL — ABNORMAL HIGH (ref 150–400)
RBC: 3.8 MIL/uL — ABNORMAL LOW (ref 3.87–5.11)
RDW: 20.4 % — ABNORMAL HIGH (ref 11.5–15.5)
WBC: 7 10*3/uL (ref 4.0–10.5)
nRBC: 0 % (ref 0.0–0.2)

## 2023-07-31 LAB — URINALYSIS, W/ REFLEX TO CULTURE (INFECTION SUSPECTED)
Bacteria, UA: NONE SEEN
Bilirubin Urine: NEGATIVE
Glucose, UA: NEGATIVE mg/dL
Ketones, ur: NEGATIVE mg/dL
Leukocytes,Ua: NEGATIVE
Nitrite: NEGATIVE
Protein, ur: NEGATIVE mg/dL
Specific Gravity, Urine: 1.013 (ref 1.005–1.030)
pH: 7 (ref 5.0–8.0)

## 2023-07-31 LAB — COMPREHENSIVE METABOLIC PANEL
ALT: 23 U/L (ref 0–44)
AST: 22 U/L (ref 15–41)
Albumin: 2.1 g/dL — ABNORMAL LOW (ref 3.5–5.0)
Alkaline Phosphatase: 112 U/L (ref 38–126)
Anion gap: 11 (ref 5–15)
BUN: 9 mg/dL (ref 8–23)
CO2: 24 mmol/L (ref 22–32)
Calcium: 8.2 mg/dL — ABNORMAL LOW (ref 8.9–10.3)
Chloride: 97 mmol/L — ABNORMAL LOW (ref 98–111)
Creatinine, Ser: 0.49 mg/dL (ref 0.44–1.00)
GFR, Estimated: >60 mL/min (ref >60)
Glucose, Bld: 105 mg/dL — ABNORMAL HIGH (ref 70–99)
Potassium: 2.8 mmol/L — ABNORMAL LOW (ref 3.5–5.1)
Sodium: 132 mmol/L — ABNORMAL LOW (ref 135–145)
Total Bilirubin: 0.7 mg/dL (ref 0.3–1.2)
Total Protein: 5.7 g/dL — ABNORMAL LOW (ref 6.5–8.1)

## 2023-07-31 LAB — PROTIME-INR
INR: 1.3 — ABNORMAL HIGH (ref 0.8–1.2)
Prothrombin Time: 16.7 s — ABNORMAL HIGH (ref 11.4–15.2)

## 2023-07-31 LAB — LACTIC ACID, PLASMA
Lactic Acid, Venous: 1.8 mmol/L (ref 0.5–1.9)
Lactic Acid, Venous: 1.3 mmol/L (ref 0.5–1.9)

## 2023-07-31 LAB — MRSA NEXT GEN BY PCR, NASAL: MRSA by PCR Next Gen: NOT DETECTED

## 2023-07-31 LAB — RESP PANEL BY RT-PCR (RSV, FLU A&B, COVID)  RVPGX2
Influenza A by PCR: NEGATIVE
Influenza B by PCR: NEGATIVE
Resp Syncytial Virus by PCR: NEGATIVE
SARS Coronavirus 2 by RT PCR: NEGATIVE

## 2023-07-31 MED ORDER — ACETAMINOPHEN 650 MG RE SUPP: 650.0000 mg | Freq: Four times a day (QID) | RECTAL | Status: DC | PRN

## 2023-07-31 MED ORDER — IPRATROPIUM-ALBUTEROL 0.5-2.5 (3) MG/3ML IN SOLN
3.0000 mL | Freq: Four times a day (QID) | RESPIRATORY_TRACT | Status: DC
  Administered 2023-07-31 – 2023-08-01 (×2): 3 mL via RESPIRATORY_TRACT
  Filled 2023-07-31: qty 3

## 2023-07-31 MED ORDER — ONDANSETRON HCL 4 MG PO TABS: 4.0000 mg | ORAL_TABLET | Freq: Four times a day (QID) | ORAL | Status: DC | PRN

## 2023-07-31 MED ORDER — SODIUM CHLORIDE 0.9 % IV SOLN
2.0000 g | Freq: Once | INTRAVENOUS | Status: DC
  Filled 2023-07-31: qty 10

## 2023-07-31 MED ORDER — IPRATROPIUM-ALBUTEROL 0.5-2.5 (3) MG/3ML IN SOLN: 3.0000 mL | RESPIRATORY_TRACT | Status: DC | PRN

## 2023-07-31 MED ORDER — POTASSIUM CHLORIDE CRYS ER 20 MEQ PO TBCR
40.0000 meq | EXTENDED_RELEASE_TABLET | ORAL | Status: AC
  Administered 2023-07-31 – 2023-08-01 (×2): 40 meq via ORAL
  Filled 2023-07-31 (×2): qty 2

## 2023-07-31 MED ORDER — SODIUM CHLORIDE 0.9 % IV BOLUS
1000.0000 mL | Freq: Once | INTRAVENOUS | Status: AC
  Administered 2023-07-31: 1000 mL via INTRAVENOUS

## 2023-07-31 MED ORDER — ENOXAPARIN SODIUM 40 MG/0.4ML IJ SOSY
40.0000 mg | PREFILLED_SYRINGE | INTRAMUSCULAR | Status: DC
  Administered 2023-08-01 – 2023-08-02 (×2): 40 mg via SUBCUTANEOUS
  Filled 2023-07-31 (×2): qty 0.4

## 2023-07-31 MED ORDER — VANCOMYCIN HCL IN DEXTROSE 1-5 GM/200ML-% IV SOLN
1000.0000 mg | Freq: Once | INTRAVENOUS | Status: DC
  Filled 2023-07-31: qty 200

## 2023-07-31 MED ORDER — SODIUM CHLORIDE 0.9 % IV SOLN
2.0000 g | Freq: Three times a day (TID) | INTRAVENOUS | Status: DC
  Administered 2023-07-31: 2 g via INTRAVENOUS
  Filled 2023-07-31: qty 12.5

## 2023-07-31 MED ORDER — AZITHROMYCIN 250 MG PO TABS
500.0000 mg | ORAL_TABLET | Freq: Every day | ORAL | Status: DC
  Administered 2023-08-01: 500 mg via ORAL
  Filled 2023-07-31: qty 2

## 2023-07-31 MED ORDER — VANCOMYCIN HCL 2000 MG/400ML IV SOLN
2000.0000 mg | Freq: Once | INTRAVENOUS | Status: AC
  Administered 2023-07-31: 2000 mg via INTRAVENOUS
  Filled 2023-07-31: qty 400

## 2023-07-31 MED ORDER — METHYLPREDNISOLONE SODIUM SUCC 125 MG IJ SOLR
60.0000 mg | Freq: Every day | INTRAMUSCULAR | Status: DC
  Administered 2023-08-01: 60 mg via INTRAVENOUS
  Filled 2023-07-31: qty 2

## 2023-07-31 MED ORDER — GUAIFENESIN-DM 100-10 MG/5ML PO SYRP
5.0000 mL | ORAL_SOLUTION | ORAL | Status: DC | PRN
  Administered 2023-08-01 (×2): 5 mL via ORAL
  Filled 2023-07-31 (×2): qty 5

## 2023-07-31 MED ORDER — IPRATROPIUM-ALBUTEROL 0.5-2.5 (3) MG/3ML IN SOLN
RESPIRATORY_TRACT | Status: AC
  Filled 2023-07-31: qty 3

## 2023-07-31 MED ORDER — SODIUM CHLORIDE 0.9 % IV SOLN
1.0000 g | INTRAVENOUS | Status: DC
  Administered 2023-07-31: 1 g via INTRAVENOUS
  Filled 2023-07-31: qty 10

## 2023-07-31 MED ORDER — ONDANSETRON HCL 4 MG/2ML IJ SOLN
4.0000 mg | Freq: Four times a day (QID) | INTRAMUSCULAR | Status: DC | PRN
  Administered 2023-08-04: 4 mg via INTRAVENOUS
  Filled 2023-07-31: qty 2

## 2023-07-31 MED ORDER — CHLORHEXIDINE GLUCONATE CLOTH 2 % EX PADS
6.0000 | MEDICATED_PAD | Freq: Every day | CUTANEOUS | Status: DC
  Administered 2023-08-01 – 2023-08-07 (×7): 6 via TOPICAL

## 2023-07-31 MED ORDER — METOPROLOL TARTRATE 25 MG PO TABS
25.0000 mg | ORAL_TABLET | Freq: Two times a day (BID) | ORAL | Status: DC
  Administered 2023-07-31 – 2023-08-02 (×4): 25 mg via ORAL
  Filled 2023-07-31 (×4): qty 1

## 2023-07-31 MED ORDER — ACETAMINOPHEN 325 MG PO TABS
650.0000 mg | ORAL_TABLET | Freq: Four times a day (QID) | ORAL | Status: DC | PRN
  Administered 2023-08-02 – 2023-08-07 (×2): 650 mg via ORAL
  Filled 2023-07-31 (×2): qty 2

## 2023-07-31 MED ORDER — SODIUM CHLORIDE 0.9 % IV SOLN: INTRAVENOUS | Status: DC

## 2023-07-31 NOTE — Assessment & Plan Note (Signed)
Calculated BMI is 32.4

## 2023-07-31 NOTE — Consult Note (Signed)
Pharmacy Antibiotic Note  ASSESSMENT: 62 y.o. female with PMH metastatic lung cancer, CVA, adrenal hemorrhage, CDI is presenting with pneumonia. Currently tachycardic and tachypneic, satting 92% on room air. Pharmacy has been consulted to manage cefepime and vancomycin dosing.  Patient measurements: Height: 5\' 9"  (175.3 cm) Weight: 99.8 kg (220 lb) IBW/kg (Calculated) : 66.2  Vital signs: Temp: 98.1 F (36.7 C) (09/28 1458) Temp Source: Oral (09/28 1458) BP: 108/71 (09/28 1515) Pulse Rate: 114 (09/28 1515) No results for input(s): "WBC", "CREATININE" in the last 168 hours.  Invalid input(s): "LACTICACIDVEN", "PROCALCITONIN" Estimated Creatinine Clearance: 89.3 mL/min (by C-G formula based on SCr of 0.45 mg/dL).  Allergies: Allergies  Allergen Reactions   Influenza Vaccines Anaphylaxis and Other (See Comments)    Per patient   Penicillins Anaphylaxis and Other (See Comments)   Articaine Other (See Comments)    Caused infection   Cortisone Other (See Comments)    Turned red and ran a low grade fever for 3 days   Other Rash    bandaids- skin turns red and a rash     Antimicrobials this admission: Cefepime 9/28 >>  Vancomycin 9/28 >>  Dose adjustments this admission: N/A  Microbiology results: 9/28 BCx: ordered 9/28 MRSA PCR: ordered  PLAN: Initiate cefepime 2 g IV q8H Initiate vancomycin 2000 mg IV q24H eAUC 422, Cmax 35, Cmin 8 Scr 0.8, IBW, Vd 0.72 Follow up culture results and MRSA PCR to assess for antibiotic optimization. Monitor renal function to assess for any necessary antibiotic dosing changes.   Thank you for allowing pharmacy to be a part of this patient's care.  [name], PharmD Clinical Pharmacist 07/31/2023 3:46 PM

## 2023-07-31 NOTE — Assessment & Plan Note (Signed)
Patient has been on palliative chemotherapy. Recent hospitalization for post obstructive pneumonia.  Continue supportive medical care.

## 2023-07-31 NOTE — Assessment & Plan Note (Signed)
Hyponatremia.   Continue K correction with Kcl, 80 meq in 2 divided doses po, and follow up renal function and electrolytes in am.  Continue IV fluids with isotonic saline at 100 ml per hr.

## 2023-07-31 NOTE — ED Triage Notes (Signed)
Pt with lung CA and SOB for past 2 days.  Pt recently admitted and discharged.  Productive sough at times.

## 2023-07-31 NOTE — H&P (Addendum)
History and Physical    Patient: Miranda Perkins JXB:[id number] DOB: August 13, 1961 DOA: 07/31/2023 DOS: the patient was seen and examined on 07/31/2023 PCP: [name], DO  Patient coming from: Home  Chief Complaint:  Chief Complaint  Patient presents with   Shortness of Breath   HPI: Miranda Perkins is a 62 y.o. 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   Recent hospitalization 09/3 to 07/11/23 for post obstructive pneumonia complicated with severe sepsis.  Patient has been getting palliative chemotherapy, started on 07/14/23.   Over last 3 to 4 days, patient has been experiencing productive cough with thick sputum, worsening dyspnea and pleuritic chest pain. Generalized malaise and for the last 2 nights fever. She also noted worsening lower extremity edema, apparently she has not been taking prescribed furosemide.  09/25 Oncology nurse was contacted by patients daughter, regarding worsening lower extremity edema, dyspnea, fever and hypoxia (02 saturation 88% on room air). She was advised to take patient to the ED. At home she has been using albuterol with no improvement in her symptoms.   At the time of my examination patient is having moderate dyspnea, associated with cough and pleuritic chest pain. No improving or worsening factors.   Review of Systems: As mentioned in the history of present illness. All other systems reviewed and are negative. Past Medical History:  Diagnosis Date   Adrenal hemorrhage (HCC)    Anemia    Arthritis    Cancer (HCC)    skin cancer - basal   Dyspnea    Edema of both lower extremities    Endometrial polyp    Hirsutism    History of 2019 novel coronavirus disease (COVID-19) 12/21/2020   positive home result documented in pcp note in epic 12-23-2020,  residual doe, pulmonology consult w/ dr wert note  in epic 03-20-2021   History of basal cell carcinoma (BCC) excision 2016   forehead, per pt no recurrence   History of CVA (cerebrovascular accident) without residual deficits 1993   pt stated , while on Chloramphenicol, no residuaL; [medication given to treat tick bite] ; patient states  "i stroked out right next to my doctor , he said i passed out and that was my only symptom" ;  no resiudal   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 branch,  work-up results in epic 03/ 2022 (normal nuclear stress test, normal echo, no arrhythmia's per event monitor);   (05-28-2021 per pt no symptoms since 03/ 2022)   Multiple thyroid nodules    endocrinologist--- dr g. nida,  hx benign bx, clinically euthyroid   Osteopenia 12/2017   T score -1.2 FRAX 4.9% / 0.3%   PMB (postmenopausal bleeding)    Pneumonia    x 2   Post-COVID chronic dyspnea    Pre-diabetes    Rosacea    Stroke (HCC)    Thickened endometrium    Past Surgical History:  Procedure Laterality Date   ANTERIOR AND POSTERIOR REPAIR N/A 08/21/2020   Procedure: ANTERIOR (CYSTOCELE) AND POSTERIOR REPAIR (RECTOCELE);  Surgeon: Genia Del, MD;  Location: [facility];  Service: Gynecology;  Laterality: N/A;  requesting 9:00am OR time  requests one hour  ANTERIOR CERVICAL DECOMP/DISCECTOMY FUSION N/A 09/27/2017   Procedure: ANTERIOR CERVICAL DECOMPRESSION/DISCECTOMY FUSION CERVICAL FOUR-FIVE ,CERVICAL FIVE-SIX,CERVICAL SIX-SEVEN;  Surgeon: Donalee Citrin, MD;  Location: [facility] OR;  Service: Neurosurgery;  Laterality: N/A;   AXILLARY LYMPH NODE BIOPSY Right 06/24/2023   Procedure: AXILLARY LYMPH NODE BIOPSY;  Surgeon: Franky Macho, MD;  Location: [facility];  Service: General;  Laterality: Right;   BRONCHIAL BIOPSY  06/14/2023   Procedure: BRONCHIAL BIOPSIES;  Surgeon: Leslye Peer, MD;  Location: [facility] ENDOSCOPY;  Service: Pulmonary;;   BRONCHIAL BRUSHINGS  06/14/2023   Procedure: BRONCHIAL BRUSHINGS;  Surgeon: Leslye Peer, MD;  Location: [facility] ENDOSCOPY;  Service: Pulmonary;;   BRONCHIAL NEEDLE ASPIRATION BIOPSY  06/14/2023   Procedure: BRONCHIAL NEEDLE ASPIRATION BIOPSIES;  Surgeon: Leslye Peer, MD;  Location: [facility] ENDOSCOPY;  Service: Pulmonary;;   CARPAL TUNNEL RELEASE Left 09/27/2017   Procedure: CARPAL TUNNEL RELEASE;  Surgeon: Donalee Citrin, MD;  Location: [facility] OR;  Service: Neurosurgery;  Laterality: Left;   DILATATION & CURETTAGE/HYSTEROSCOPY WITH MYOSURE N/A 06/03/2021   Procedure: DILATATION & CURETTAGE/HYSTEROSCOPY WITH MYOSURE;  Surgeon: Genia Del, MD;  Location: [facility] [place];  Service: Gynecology;  Laterality: N/A;   FOOT SURGERY     bone removed from pinky toe   HEMOSTASIS CONTROL  06/14/2023   Procedure: HEMOSTASIS CONTROL;  Surgeon: Leslye Peer, MD;  Location: [facility] ENDOSCOPY;  Service: Pulmonary;;   INGUINAL HERNIA REPAIR Right 1995   KNEE ARTHROSCOPY W/ MENISCAL REPAIR Left 06/21/2017   dr Netta Corrigan   LEG SURGERY  1972   MOHS SURGERY  2016   [facility] of forehead   MOUTH SURGERY  2015   removal and cauterization pallete of cancerous lesion   PORTACATH PLACEMENT N/A 06/24/2023   Procedure: INSERTION PORT-A-CATH;  Surgeon: Franky Macho, MD;  Location: [facility];  Service: General;  Laterality: N/A;   TONSILLECTOMY  1979   TONSILLECTOMY  1977   TOTAL KNEE ARTHROPLASTY Left 01/26/2018   Procedure: LEFT TOTAL KNEE ARTHROPLASTY;  Surgeon: Ranee Gosselin, MD;  Location: [facility];  Service: Orthopedics;  Laterality: Left;   TUBAL LIGATION     VIDEO BRONCHOSCOPY WITH ENDOBRONCHIAL ULTRASOUND Bilateral 06/14/2023   Procedure: VIDEO BRONCHOSCOPY WITH ENDOBRONCHIAL ULTRASOUND;  Surgeon: Leslye Peer, MD;  Location: [facility] ENDOSCOPY;  Service: Pulmonary;  Laterality: Bilateral;   Social History:  reports that she quit smoking about 13 years ago. Her  smoking use included cigarettes. She started smoking about 48 years ago. She has a 17.5 pack-year smoking history. She has never used smokeless tobacco. She reports that she does not currently use alcohol. She reports that she does not use drugs.  Allergies  Allergen Reactions   Influenza Vaccines Anaphylaxis and Other (See Comments)    Per patient   Penicillins Anaphylaxis and Other (See Comments)   Articaine Other (See Comments)    Caused infection   Cortisone Other (See Comments)    Turned red and ran a low grade fever for 3 days   Other Rash    bandaids- skin turns red and a rash     Family History  Problem Relation Age of Onset   Diabetes Mother    Heart Problems Mother    Rheum arthritis Mother    COPD Mother    Polycystic kidney disease Mother    Carpal tunnel syndrome Mother    Cancer Father        Liver Cancer   Liver cancer Father    Carpal tunnel syndrome Sister  Thyroid disease Sister    Rheum arthritis Sister    Lung disease Sister    Diabetes Sister    Carpal tunnel syndrome Sister    Clotting disorder Sister    Rheum arthritis Sister    Thyroid disease Sister    COPD Sister    Heart Problems Sister    Gout Sister    Cancer Brother    Heart Problems Maternal Grandmother    Cancer Maternal Grandfather        Lung Cancer   Heart Problems Maternal Grandfather    Heart Problems Paternal Grandmother    Cancer Paternal Grandfather        Brain & Skin cancer   Polycystic ovary syndrome Daughter    Rheum arthritis Maternal Aunt    Fibromyalgia Maternal Aunt    Polycystic kidney disease Maternal Aunt    Heart Problems Maternal Aunt    Anemia Maternal Aunt    Adrenal disorder Maternal Aunt    Carpal tunnel syndrome Maternal Aunt    Diabetes Maternal Aunt    Thyroid disease Maternal Aunt    Rheum arthritis Maternal Aunt    Diabetes Maternal Aunt    Diabetes Maternal Aunt    Rheum arthritis Maternal Aunt    Heart Problems Maternal Aunt    Rheum  arthritis Maternal Uncle    Heart Problems Maternal Uncle     Prior to Admission medications   Medication Sig Start Date End Date Taking? Authorizing Provider  albuterol (PROVENTIL) (2.5 MG/3ML) 0.083% nebulizer solution Take 3 mLs (2.5 mg total) by nebulization every 6 (six) hours as needed for wheezing or shortness of breath. 07/28/23  Yes Rakes, Doralee Albino, FNP  doxycycline (ADOXA) 50 MG tablet Take 50 mg by mouth 2 (two) times daily.   Yes [provider]  folic acid (FOLVITE) 1 MG tablet Take 1 tablet (1 mg total) by mouth daily. 07/22/23  Yes Mauro Kaufmann, NP  furosemide (LASIX) 20 MG tablet Take 1 tablet (20 mg total) by mouth daily. Patient taking differently: Take 40 mg by mouth daily. 06/26/23 06/25/24 Yes Emokpae, Courage, MD  guaiFENesin (MUCINEX) 600 MG 12 hr tablet Take 2 tablets (1,200 mg total) by mouth 2 (two) times daily. 06/26/23  Yes Emokpae, Courage, MD  megestrol (MEGACE) 20 MG tablet Take 20 mg by mouth 2 (two) times daily.   Yes [provider]  metoprolol tartrate (LOPRESSOR) 25 MG tablet Take 1 tablet (25 mg total) by mouth 2 (two) times daily. 07/22/23  Yes Mauro Kaufmann, NP  Multiple Vitamin (MULTIVITAMIN WITH MINERALS) TABS tablet Take 1 tablet by mouth daily. 06/27/23  Yes Emokpae, Courage, MD  oxyCODONE (OXY IR/ROXICODONE) 5 MG immediate release tablet Take 1 tablet (5 mg total) by mouth every 4 (four) hours as needed for moderate pain. 06/30/23  Yes [name], MD  potassium chloride (KLOR-CON M) 10 MEQ tablet Take 1 tablet (10 mEq total) by mouth daily. 07/12/23  Yes Tat, Onalee Hua, MD  prochlorperazine (COMPAZINE) 10 MG tablet Take 1 tablet (10 mg total) by mouth every 6 (six) hours as needed for nausea or vomiting. 07/01/23  Yes [name], MD  saccharomyces boulardii (FLORASTOR) 250 MG capsule Take 1 capsule (250 mg total) by mouth 2 (two) times daily. Patient taking differently: Take 250 mg by mouth daily. 06/26/23  Yes Shon Hale, MD    Physical Exam: Vitals:   07/31/23 1458 07/31/23 1458 07/31/23 1515 07/31/23 1700  BP:  114/78 108/71 112/62  Pulse:  [name])  124 (!) 114 100  Resp:  (!) 24 (!) 25 (!) 22  Temp:  98.1 F (36.7 C)    TempSrc:  Oral    SpO2:  95% 92% 97%  Weight: 99.8 kg     Height: 5\' 9"  (1.753 m)      BP 120/60   Pulse (!) 112   Temp 98.1 F (36.7 C) (Oral)   Resp (!) 25   Ht 5\' 9"  (1.753 m)   Wt 99.8 kg   SpO2 97%   BMI 32.49 kg/m   Neurology awake and alert, deconditioned and ill looking appearing  ENT with mild pallor Cardiovascular with S1 and S2 present and tachycardic with no gallops, rubs or murmurs No JVD Lungs with rales at the left side with no wheezing or rhonchi Abdomen with no distention  Positive lower extremity edema ++ pitting bilaterally No rashes.   Data Reviewed:   Na 132, K 2,8 Cl 97 bicarbonate 24, glucose 105, bun 9, cr 0,49 AST 22, ALT 23  Lactic acid 1,8 and 1,3  Wbc 7,0 hgb 9,4 plt 471   Chest radiograph with left rotation, hyperinflation, small left pleural effusion, left hilar mass. [place] with tip at the SVC.   EKG 116 bpm, normal axis, normal intervals, sinus rhythm with poor  R R wave progression, with no significant ST segment changes, negative T wave V5 and V6. Low voltage.   Assessment and Plan: * Left upper lobe pneumonia Left post obstructive pneumonia. In the setting of immunosuppression due to chemotherapy.  Severe sepsis with acute hypoxemic respiratory failure, (present on admission).   Admit patient to step down unit.  Plan to continue broad spectrum antibiotic therapy with IV ceftriaxone and oral azithromycin.  IV fluids with isotonic saline at 100 ml per hr For severe pneumonia, will add systemic corticosteroids with methylprednisolone 60 mg IV daily.  Add bronchodilator therapy and airway clearing techniques with flutter valve and incentive spirometer.  Antitussive agents with guaifenesin and dextromethorphan.  Follow  up cultures, cell count and temperature curve.  Continue supplemental 02 per [place] to keep 02 saturation 92% or greater.   Echocardiogram from 09.05.24 with preserved LV systolic function EF 75%, with preserved RV systolic function. No significant valvular disease.  Patient may have diastolic heart failure, but more likely her edema is due to hypo-proteinemia related to malignancy.  Hold on diuretic therapy for now.    Continue metoprolol for now to avoid rebound tachycardia.   Hypokalemia Hyponatremia.   Continue K correction with Kcl, 80 meq in 2 divided doses po, and follow up renal function and electrolytes in am.  Continue IV fluids with isotonic saline at 100 ml per hr.   Metastatic primary lung cancer, left [facility]) Patient has been on palliative chemotherapy. Recent hospitalization for post obstructive pneumonia.  Continue supportive medical care.   Class 1 obesity Calculated BMI is 32.4     Advance Care Planning:   Code Status: Full Code   Consults: none   Family Communication: no family at the bedside   Severity of Illness: The appropriate patient status for this patient is INPATIENT. Inpatient status is judged to be reasonable and necessary in order to provide the required intensity of service to ensure the patient's safety. The patient's presenting symptoms, physical exam findings, and initial radiographic and laboratory data in the context of their chronic comorbidities is felt to place them at high risk for further clinical deterioration. Furthermore, it is not anticipated that the patient will  be medically stable for discharge from the hospital within 2 midnights of admission.   * I certify that at the point of admission it is my clinical judgment that the patient will require inpatient hospital care spanning beyond 2 midnights from the point of admission due to high intensity of service, high risk for further deterioration and high frequency of surveillance  required.*  Author: Coralie Keens, MD 07/31/2023 8:18 PM  For on call review [email or web address].

## 2023-07-31 NOTE — ED Notes (Signed)
ED TO INPATIENT HANDOFF REPORT  ED Nurse Name and Phone #:   S Name/Age/Gender Corine Shelter 62 y.o. female Room/Bed: APA01/APA01  Code Status   Code Status: Full Code  Home/SNF/Other Home Patient oriented to: self, place, time, and situation Is this baseline? Yes   Triage Complete: Triage complete  Chief Complaint Pneumonia [J18.9]  Triage Note Pt with lung CA and SOB for past 2 days.  Pt recently admitted and discharged.  Productive sough at times.    Allergies Allergies  Allergen Reactions   Influenza Vaccines Anaphylaxis and Other (See Comments)    Per patient   Penicillins Anaphylaxis and Other (See Comments)   Articaine Other (See Comments)    Caused infection   Cortisone Other (See Comments)    Turned red and ran a low grade fever for 3 days   Other Rash    bandaids- skin turns red and a rash     Level of Care/Admitting Diagnosis ED Disposition     ED Disposition  Admit   Condition  --   Comment  Hospital Area: [facility] [[id number]]  Level of Care: Med-Surg [16]  Covid Evaluation: Asymptomatic - no recent exposure (last 10 days) testing not required  Diagnosis: Pneumonia [227785]  Admitting Physician: Miranda Perkins [[id number]]  Attending Physician: Miranda Perkins [[id number]]  Certification:: I certify this patient will need inpatient services for at least 2 midnights  Expected Medical Readiness: 08/02/2023          B Medical/Surgery History Past Medical History:  Diagnosis Date   Adrenal hemorrhage (HCC)    Anemia    Arthritis    Cancer (HCC)    skin cancer - basal   Dyspnea    Edema of both lower extremities    Endometrial polyp    Hirsutism    History of 2019 novel coronavirus disease (COVID-19) 12/21/2020   positive home result documented in pcp note in epic 12-23-2020,  residual doe, pulmonology consult w/ dr wert note in epic 03-20-2021   History of basal cell carcinoma (BCC) excision 2016   forehead,  per pt no recurrence   History of CVA (cerebrovascular accident) without residual deficits 1993   pt stated , while on Chloramphenicol, no residuaL; [medication given to treat tick bite] ; patient states  "i stroked out right next to my doctor , he said i passed out and that was my only symptom" ;  no resiudal   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 branch,  work-up results in epic 03/ 2022 (normal nuclear stress test, normal echo, no arrhythmia's per event monitor);   (05-28-2021 per pt no symptoms since 03/ 2022)   Multiple thyroid nodules    endocrinologist--- dr g. nida,  hx benign bx, clinically euthyroid   Osteopenia 12/2017   T score -1.2 FRAX 4.9% / 0.3%   PMB (postmenopausal bleeding)    Pneumonia    x 2   Post-COVID chronic dyspnea    Pre-diabetes    Rosacea    Stroke (HCC)    Thickened endometrium    Past Surgical History:  Procedure Laterality Date   ANTERIOR AND POSTERIOR REPAIR N/A 08/21/2020   Procedure: ANTERIOR (CYSTOCELE) AND POSTERIOR REPAIR (RECTOCELE);  Surgeon: Genia Del, MD;  Location: [facility];  Service: Gynecology;  Laterality:  N/A;  requesting 9:00am OR time  requests one hour   ANTERIOR CERVICAL DECOMP/DISCECTOMY FUSION N/A 09/27/2017   Procedure: ANTERIOR CERVICAL DECOMPRESSION/DISCECTOMY FUSION CERVICAL FOUR-FIVE ,CERVICAL FIVE-SIX,CERVICAL SIX-SEVEN;  Surgeon: Donalee Citrin, MD;  Location: [facility] OR;  Service: Neurosurgery;  Laterality: N/A;   AXILLARY LYMPH NODE BIOPSY Right 06/24/2023   Procedure: AXILLARY LYMPH NODE BIOPSY;  Surgeon: Franky Macho, MD;  Location: [facility];  Service: General;  Laterality: Right;   BRONCHIAL BIOPSY  06/14/2023   Procedure: BRONCHIAL BIOPSIES;  Surgeon: Leslye Peer, MD;  Location: [facility] ENDOSCOPY;  Service: Pulmonary;;   BRONCHIAL BRUSHINGS  06/14/2023    Procedure: BRONCHIAL BRUSHINGS;  Surgeon: Leslye Peer, MD;  Location: [facility] ENDOSCOPY;  Service: Pulmonary;;   BRONCHIAL NEEDLE ASPIRATION BIOPSY  06/14/2023   Procedure: BRONCHIAL NEEDLE ASPIRATION BIOPSIES;  Surgeon: Leslye Peer, MD;  Location: [facility] ENDOSCOPY;  Service: Pulmonary;;   CARPAL TUNNEL RELEASE Left 09/27/2017   Procedure: CARPAL TUNNEL RELEASE;  Surgeon: Donalee Citrin, MD;  Location: [facility] OR;  Service: Neurosurgery;  Laterality: Left;   DILATATION & CURETTAGE/HYSTEROSCOPY WITH MYOSURE N/A 06/03/2021   Procedure: DILATATION & CURETTAGE/HYSTEROSCOPY WITH MYOSURE;  Surgeon: Genia Del, MD;  Location: [facility];  Service: Gynecology;  Laterality: N/A;   FOOT SURGERY     bone removed from pinky toe   HEMOSTASIS CONTROL  06/14/2023   Procedure: HEMOSTASIS CONTROL;  Surgeon: Leslye Peer, MD;  Location: [facility] ENDOSCOPY;  Service: Pulmonary;;   INGUINAL HERNIA REPAIR Right 1995   KNEE ARTHROSCOPY W/ MENISCAL REPAIR Left 06/21/2017   dr Netta Corrigan   LEG SURGERY  1972   MOHS SURGERY  2016   [facility] of forehead   MOUTH SURGERY  2015   removal and cauterization pallete of cancerous lesion   PORTACATH PLACEMENT N/A 06/24/2023   Procedure: INSERTION PORT-A-CATH;  Surgeon: Franky Macho, MD;  Location: [facility];  Service: General;  Laterality: N/A;   TONSILLECTOMY  1979   TONSILLECTOMY  1977   TOTAL KNEE ARTHROPLASTY Left 01/26/2018   Procedure: LEFT TOTAL KNEE ARTHROPLASTY;  Surgeon: Ranee Gosselin, MD;  Location: [facility];  Service: Orthopedics;  Laterality: Left;   TUBAL LIGATION     VIDEO BRONCHOSCOPY WITH ENDOBRONCHIAL ULTRASOUND Bilateral 06/14/2023   Procedure: VIDEO BRONCHOSCOPY WITH ENDOBRONCHIAL ULTRASOUND;  Surgeon: Leslye Peer, MD;  Location: [facility] ENDOSCOPY;  Service: Pulmonary;  Laterality: Bilateral;     A IV Location/Drains/Wounds Patient Lines/Drains/Airways Status     Active Line/Drains/Airways     Name Placement date Placement time Site Days    Implanted Port Left Chest --  --  Chest  --   Peripheral IV 07/31/23 20 G Anterior;Distal;Right Forearm 07/31/23  1532  Forearm  less than 1   Peripheral IV 07/31/23 Right Antecubital 07/31/23  1533  Antecubital  less than 1   Incision - 1 Port Chest Left;Upper 06/24/23  1440  -- 37            Intake/Output Last 24 hours No intake or output data in the 24 hours ending 07/31/23 2016  Labs/Imaging Results for orders placed or performed during the hospital encounter of 07/31/23 (from the past 48 hour(s))  Comprehensive metabolic panel     Status: Abnormal   Collection Time: 07/31/23  3:29 PM  Result Value Ref Range   Sodium 132 (L) 135 - 145 mmol/L   Potassium 2.8 (L) 3.5 - 5.1 mmol/L   Chloride 97 (L) 98 - 111 mmol/L   CO2 24 22 - 32 mmol/L  Glucose, Bld 105 (H) 70 - 99 mg/dL    Comment: Glucose reference range applies only to samples taken after fasting for at least 8 hours.   BUN 9 8 - 23 mg/dL   Creatinine, Ser 1.61 0.44 - 1.00 mg/dL   Calcium 8.2 (L) 8.9 - 10.3 mg/dL   Total Protein 5.7 (L) 6.5 - 8.1 g/dL   Albumin 2.1 (L) 3.5 - 5.0 g/dL   AST 22 15 - 41 U/L   ALT 23 0 - 44 U/L   Alkaline Phosphatase 112 38 - 126 U/L   Total Bilirubin 0.7 0.3 - 1.2 mg/dL   GFR, Estimated >09 >60 mL/min    Comment: (NOTE) Calculated using the CKD-EPI Creatinine Equation (2021)    Anion gap 11 5 - 15    Comment: Performed at [facility], [address] [place], [place] [address]  Lactic acid, plasma     Status: None   Collection Time: 07/31/23  3:29 PM  Result Value Ref Range   Lactic Acid, Venous 1.8 0.5 - 1.9 mmol/L    Comment: Performed at [facility] [address] [place], [place] [address]  CBC with Differential     Status: Abnormal   Collection Time: 07/31/23  3:29 PM  Result Value Ref Range   WBC 7.0 4.0 - 10.5 K/uL   RBC 3.80 (L) 3.87 - 5.11 MIL/uL   Hemoglobin 9.4 (L) 12.0 - 15.0 g/dL   HCT 47.8 (L) 29.5 - 62.1 %   MCV 81.3 80.0 - 100.0 fL   MCH 24.7 (L) 26.0 - 34.0  pg   MCHC 30.4 30.0 - 36.0 g/dL   RDW 30.8 (H) 65.7 - 84.6 %   Platelets 471 (H) 150 - 400 K/uL   nRBC 0.0 0.0 - 0.2 %   Neutrophils Relative % 66 %   Neutro Abs 4.6 1.7 - 7.7 K/uL   Lymphocytes Relative 21 %   Lymphs Abs 1.5 0.7 - 4.0 K/uL   Monocytes Relative 12 %   Monocytes Absolute 0.8 0.1 - 1.0 K/uL   Eosinophils Relative 0 %   Eosinophils Absolute 0.0 0.0 - 0.5 K/uL   Basophils Relative 0 %   Basophils Absolute 0.0 0.0 - 0.1 K/uL   Immature Granulocytes 1 %   Abs Immature Granulocytes 0.06 0.00 - 0.07 K/uL    Comment: Performed at [facility], [address] [place], [place] [address]  Protime-INR     Status: Abnormal   Collection Time: 07/31/23  3:29 PM  Result Value Ref Range   Prothrombin Time 16.7 (H) 11.4 - 15.2 seconds   INR 1.3 (H) 0.8 - 1.2    Comment: (NOTE) INR goal varies based on device and disease states. Performed at [facility], [address], [place], [place] [address]   Culture, blood (Routine x 2)     Status: None (Preliminary result)   Collection Time: 07/31/23  3:29 PM   Specimen: BLOOD RIGHT WRIST  Result Value Ref Range   Specimen Description BLOOD RIGHT WRIST    Special Requests      Blood Culture adequate volume Performed at [facility], [address], [place], [place] [address]    Culture PENDING    Report Status PENDING   Culture, blood (Routine x 2)     Status: None (Preliminary result)   Collection Time: 07/31/23  3:29 PM   Specimen: Right Antecubital; Blood  Result Value Ref Range   Specimen Description RIGHT ANTECUBITAL    Special Requests  Blood Culture adequate volume Performed at [facility], [address] [place], [place] [address]    Culture PENDING    Report Status PENDING   Lactic acid, plasma     Status: None   Collection Time: 07/31/23  4:34 PM  Result Value Ref Range   Lactic Acid, Venous 1.3 0.5 - 1.9 mmol/L    Comment: Performed at [facility], [address] [place], [place] [address]  Urinalysis, w/  Reflex to Culture (Infection Suspected) -Urine, Clean Catch     Status: Abnormal   Collection Time: 07/31/23  7:00 PM  Result Value Ref Range   Specimen Source URINE, CATHETERIZED    Color, Urine YELLOW YELLOW   APPearance HAZY (A) CLEAR   Specific Gravity, Urine 1.013 1.005 - 1.030   pH 7.0 5.0 - 8.0   Glucose, UA NEGATIVE NEGATIVE mg/dL   Hgb urine dipstick SMALL (A) NEGATIVE   Bilirubin Urine NEGATIVE NEGATIVE   Ketones, ur NEGATIVE NEGATIVE mg/dL   Protein, ur NEGATIVE NEGATIVE mg/dL   Nitrite NEGATIVE NEGATIVE   Leukocytes,Ua NEGATIVE NEGATIVE   RBC / HPF 0-5 0 - 5 RBC/hpf   WBC, UA 0-5 0 - 5 WBC/hpf    Comment:        Reflex urine culture not performed if WBC <=10, OR if Squamous epithelial cells >5. If Squamous epithelial cells >5 suggest recollection.    Bacteria, UA NONE SEEN NONE SEEN   Squamous Epithelial / HPF 6-10 0 - 5 /HPF    Comment: Performed at [facility], [address] [place], [place] [address]  Resp panel by RT-PCR (RSV, Flu A&B, Covid) Anterior Nasal Swab     Status: None   Collection Time: 07/31/23  7:00 PM   Specimen: Anterior Nasal Swab  Result Value Ref Range   SARS Coronavirus 2 by RT PCR NEGATIVE NEGATIVE    Comment: (NOTE) SARS-CoV-2 target nucleic acids are NOT DETECTED.  The SARS-CoV-2 RNA is generally detectable in upper respiratory specimens during the acute phase of infection. The lowest concentration of SARS-CoV-2 viral copies this assay can detect is 138 copies/mL. A negative result does not preclude SARS-Cov-2 infection and should not be used as the sole basis for treatment or other patient management decisions. A negative result may occur with  improper specimen collection/handling, submission of specimen other than nasopharyngeal swab, presence of viral mutation(s) within the areas targeted by this assay, and inadequate number of viral copies(<138 copies/mL). A negative result must be combined with clinical observations, patient  history, and epidemiological information. The expected result is Negative.  Fact Sheet for Patients:  BloggerCourse.com  Fact Sheet for Healthcare Providers:  SeriousBroker.it  This test is no t yet approved or cleared by the [place] FDA and  has been authorized for detection and/or diagnosis of SARS-CoV-2 by FDA under an Emergency Use Authorization (EUA). This EUA will remain  in effect (meaning this test can be used) for the duration of the COVID-19 declaration under Section 564(b)(1) of the Act, 21 U.S.C.section 360bbb-3(b)(1), unless the authorization is terminated  or revoked sooner.       Influenza A by PCR NEGATIVE NEGATIVE   Influenza B by PCR NEGATIVE NEGATIVE    Comment: (NOTE) The Xpert Xpress SARS-CoV-2/FLU/RSV plus assay is intended as an aid in the diagnosis of influenza from Nasopharyngeal swab specimens and should not be used as a sole basis for treatment. Nasal washings and aspirates are unacceptable for Xpert Xpress SARS-CoV-2/FLU/RSV testing.  Fact Sheet for Patients:  BloggerCourse.com  Fact Sheet for Healthcare Providers: SeriousBroker.it  This test is not yet approved or cleared by the [place] FDA and has been authorized for detection and/or diagnosis of SARS-CoV-2 by FDA under an Emergency Use Authorization (EUA). This EUA will remain in effect (meaning this test can be used) for the duration of the COVID-19 declaration under Section 564(b)(1) of the Act, 21 U.S.C. section 360bbb-3(b)(1), unless the authorization is terminated or revoked.     Resp Syncytial Virus by PCR NEGATIVE NEGATIVE    Comment: (NOTE) Fact Sheet for Patients: BloggerCourse.com  Fact Sheet for Healthcare Providers: SeriousBroker.it  This test is not yet approved or cleared by the [place] FDA and has been  authorized for detection and/or diagnosis of SARS-CoV-2 by FDA under an Emergency Use Authorization (EUA). This EUA will remain in effect (meaning this test can be used) for the duration of the COVID-19 declaration under Section 564(b)(1) of the Act, 21 U.S.C. section 360bbb-3(b)(1), unless the authorization is terminated or revoked.  Performed at [facility], [address] [place], [place] [address]    DG Chest Portable 1 View  Result Date: 07/31/2023 CLINICAL DATA:  Shortness of breath.  Lung cancer. EXAM: PORTABLE CHEST 1 VIEW COMPARISON:  07/06/2023 FINDINGS: Right lung clear. Left parahilar mass again noted. Left base collapse/consolidation is similar to prior with small left pleural effusion. Left Port-A-Cath remains in place. No acute bony abnormality. Telemetry leads overlie the chest. IMPRESSION: No substantial interval change. Persistent left base collapse/consolidation with small left effusion. Electronically Signed   By: Kennith Center M.D.   On: 07/31/2023 16:25    Pending Labs Unresulted Labs (From admission, onward)     Start     Ordered   08/01/23 0500  Creatinine, serum  Tomorrow morning,   R       Comments: For cefepime and vancomycin pharmacy consults    07/31/23 1640   07/31/23 1546  MRSA Next Gen by PCR, Nasal  (MRSA Screening)  Once,   URGENT        07/31/23 1545   Signed and Held  CBC  (enoxaparin (LOVENOX)    CrCl >/= 30 ml/min)  Once,   R       Comments: Baseline for enoxaparin therapy IF NOT ALREADY DRAWN.  Notify MD if PLT < 100 K.    Signed and Held   Signed and Held  Creatinine, serum  (enoxaparin (LOVENOX)    CrCl >/= 30 ml/min)  Once,   R       Comments: Baseline for enoxaparin therapy IF NOT ALREADY DRAWN.    Signed and Held   Signed and Held  Creatinine, serum  (enoxaparin (LOVENOX)    CrCl >/= 30 ml/min)  Weekly,   R     Comments: while on enoxaparin therapy    Signed and Held   Signed and Held  Basic metabolic panel  Tomorrow morning,   R         Signed and Held   Signed and Held  CBC  Tomorrow morning,   R        Signed and Held            Vitals/Pain Today's Vitals   07/31/23 1458 07/31/23 1458 07/31/23 1515 07/31/23 1700  BP:  114/78 108/71 112/62  Pulse:  (!) 124 (!) 114 100  Resp:  (!) 24 (!) 25 (!) 22  Temp:  98.1 F (36.7 C)    TempSrc:  Oral  SpO2:  95% 92% 97%  Weight: 220 lb (99.8 kg)     Height: 5\' 9"  (1.753 m)     PainSc: 0-No pain       Isolation Precautions No active isolations  Medications Medications  ceFEPIme (MAXIPIME) 2 g in sodium chloride 0.9 % 100 mL IVPB (0 g Intravenous Stopped 07/31/23 1706)  sodium chloride 0.9 % bolus 1,000 mL (0 mLs Intravenous Stopped 07/31/23 1632)  vancomycin (VANCOREADY) IVPB 2000 mg/400 mL (0 mg Intravenous Stopped 07/31/23 2016)    Mobility walks with person assist     Focused Assessments    R Recommendations: See Admitting Provider Note  Report given to:   Additional Notes:

## 2023-07-31 NOTE — ED Provider Notes (Signed)
[facility] AT [facility] Provider Note   CSN: [id number] Arrival date & time: 07/31/23  1451     History  Chief Complaint  Patient presents with   Shortness of Breath    Miranda Perkins is a 62 y.o. female with past medical history of carcinoma of the lung currently undergoing chemotherapy, and recent admission for pneumonia/sepsis who presents to the ED for shortness of breath.  Patient's last chemotherapy appointment was about 3 weeks ago.  Over the last 2 to 3 days she has experienced persistent shortness of breath and fevers at home.  Productive coughing with clear sputum.  No chest pain, abdominal pain, vomiting or diarrhea.  She tried albuterol nebulizer treatments at home which did not help with her shortness of breath.  No home O2 at baseline.   Shortness of Breath      Home Medications Prior to Admission medications   Medication Sig Start Date End Date Taking? Authorizing Provider  albuterol (PROVENTIL) (2.5 MG/3ML) 0.083% nebulizer solution Take 3 mLs (2.5 mg total) by nebulization every 6 (six) hours as needed for wheezing or shortness of breath. 07/28/23   Sonny Masters, FNP  cefadroxil (DURICEF) 1 g tablet Take 1 tablet (1 g total) by mouth 2 (two) times daily. 07/11/23   Catarina Hartshorn, MD  folic acid (FOLVITE) 1 MG tablet Take 1 tablet (1 mg total) by mouth daily. 07/22/23   Mauro Kaufmann, NP  furosemide (LASIX) 20 MG tablet Take 1 tablet (20 mg total) by mouth daily. Patient taking differently: Take 40 mg by mouth daily. 06/26/23 06/25/24  Shon Hale, MD  guaiFENesin (MUCINEX) 600 MG 12 hr tablet Take 2 tablets (1,200 mg total) by mouth 2 (two) times daily. 06/26/23   Shon Hale, MD  lidocaine-prilocaine (EMLA) cream Apply a quarter-sized amount to port a cath site and cover with plastic wrap 1 hour prior to infusion appointments 07/01/23   [name], MD  lidocaine-prilocaine (EMLA) cream Apply to affected area once  07/14/23   [name], MD  megestrol (MEGACE) 400 MG/10ML suspension Take 400 mg by mouth 2 (two) times daily.    [provider]  metoprolol tartrate (LOPRESSOR) 25 MG tablet Take 1 tablet (25 mg total) by mouth 2 (two) times daily. 07/22/23   Mauro Kaufmann, NP  metroNIDAZOLE (FLAGYL) 500 MG tablet Take 1 tablet (500 mg total) by mouth 2 (two) times daily. 07/11/23   Catarina Hartshorn, MD  Multiple Vitamin (MULTIVITAMIN WITH MINERALS) TABS tablet Take 1 tablet by mouth daily. 06/27/23   Shon Hale, MD  oxyCODONE (OXY IR/ROXICODONE) 5 MG immediate release tablet Take 1 tablet (5 mg total) by mouth every 4 (four) hours as needed for moderate pain. 06/30/23   [name], MD  potassium chloride (KLOR-CON M) 10 MEQ tablet Take 1 tablet (10 mEq total) by mouth daily. 07/12/23   Catarina Hartshorn, MD  prochlorperazine (COMPAZINE) 10 MG tablet Take 1 tablet (10 mg total) by mouth every 6 (six) hours as needed for nausea or vomiting. 07/01/23   [name], MD  prochlorperazine (COMPAZINE) 5 MG tablet Take 1 tablet (5 mg total) by mouth 3 (three) times daily before meals. 07/11/23   Catarina Hartshorn, MD  progesterone (PROMETRIUM) 200 MG capsule 1 tablet daily at bedtime the first 10 days of each calendar month 07/20/23   Lazaro Arms, MD  saccharomyces boulardii (FLORASTOR) 250 MG capsule Take 1 capsule (250 mg total) by mouth 2 (two) times daily.  06/26/23   Shon Hale, MD      Allergies    Influenza vaccines, Penicillins, Articaine, Cortisone, and Other    Review of Systems   Review of Systems  Respiratory:  Positive for shortness of breath.     Physical Exam Updated Vital Signs BP 112/62   Pulse 100   Temp 98.1 F (36.7 C) (Oral)   Resp (!) 22   Ht 5\' 9"  (1.753 m)   Wt 99.8 kg   SpO2 97%   BMI 32.49 kg/m  Physical Exam Vitals and nursing note reviewed.  HENT:     Head: Normocephalic and atraumatic.  Eyes:     Pupils: Pupils are equal, round, and reactive to  light.  Cardiovascular:     Rate and Rhythm: Normal rate and regular rhythm.  Pulmonary:     Effort: Pulmonary effort is normal.     Comments: Diminished breath sounds at left lung base. No wheezing rales or rhonchi Abdominal:     Palpations: Abdomen is soft.     Tenderness: There is no abdominal tenderness.  Skin:    General: Skin is warm and dry.  Neurological:     Mental Status: She is alert.  Psychiatric:        Mood and Affect: Mood normal.     ED Results / Procedures / Treatments   Labs (all labs ordered are listed, but only abnormal results are displayed) Labs Reviewed  COMPREHENSIVE METABOLIC PANEL - Abnormal; Notable for the following components:      Result Value   Sodium 132 (*)    Potassium 2.8 (*)    Chloride 97 (*)    Glucose, Bld 105 (*)    Calcium 8.2 (*)    Total Protein 5.7 (*)    Albumin 2.1 (*)    All other components within normal limits  CBC WITH DIFFERENTIAL/PLATELET - Abnormal; Notable for the following components:   RBC 3.80 (*)    Hemoglobin 9.4 (*)    HCT 30.9 (*)    MCH 24.7 (*)    RDW 20.4 (*)    Platelets 471 (*)    All other components within normal limits  PROTIME-INR - Abnormal; Notable for the following components:   Prothrombin Time 16.7 (*)    INR 1.3 (*)    All other components within normal limits  CULTURE, BLOOD (ROUTINE X 2)  CULTURE, BLOOD (ROUTINE X 2)  RESP PANEL BY RT-PCR (RSV, FLU A&B, COVID)  RVPGX2  MRSA NEXT GEN BY PCR, NASAL  LACTIC ACID, PLASMA  LACTIC ACID, PLASMA  URINALYSIS, W/ REFLEX TO CULTURE (INFECTION SUSPECTED)  CREATININE, SERUM    EKG EKG Interpretation Date/Time:  Saturday July 31 2023 15:17:10 EDT Ventricular Rate:  116 PR Interval:  161 QRS Duration:  69 QT Interval:  348 QTC Calculation: 484 R Axis:   62  Text Interpretation: Age not entered, assumed to be  62 years old for purpose of ECG interpretation Sinus tachycardia Low voltage, precordial leads Anteroseptal infarct, old  Confirmed by Estelle June [phone]) on 07/31/2023 5:20:34 PM  Radiology DG Chest Portable 1 View  Result Date: 07/31/2023 CLINICAL DATA:  Shortness of breath.  Lung cancer. EXAM: PORTABLE CHEST 1 VIEW COMPARISON:  07/06/2023 FINDINGS: Right lung clear. Left parahilar mass again noted. Left base collapse/consolidation is similar to prior with small left pleural effusion. Left Port-A-Cath remains in place. No acute bony abnormality. Telemetry leads overlie the chest. IMPRESSION: No substantial interval change. Persistent left base collapse/consolidation with small  left effusion. Electronically Signed   By: Kennith Center M.D.   On: 07/31/2023 16:25    Procedures Procedures    Medications Ordered in ED Medications  ceFEPIme (MAXIPIME) 2 g in sodium chloride 0.9 % 100 mL IVPB (0 g Intravenous Stopped 07/31/23 1706)  vancomycin (VANCOREADY) IVPB 2000 mg/400 mL (2,000 mg Intravenous New Bag/Given 07/31/23 1634)  sodium chloride 0.9 % bolus 1,000 mL (0 mLs Intravenous Stopped 07/31/23 1632)    ED Course/ Medical Decision Making/ A&P Clinical Course as of 07/31/23 1723  Sat Jul 31, 2023  1722 Chest x-ray shows left-sided effusion/consolidation.  No significant leukocytosis.  Lactic of 1.3.  Electrolyte panel notable for recurrent hypokalemia and hypoalbuminemia.  Patient has remained stable to this point.  She will be admitted for inpatient management of presumed hospital-acquired pneumonia. [MP]    Clinical Course User Index [MP] Royanne Foots, DO                                 Medical Decision Making 62 year old female with history as above returns for shortness of breath fevers.  Meeting SIRS criteria based on initial vital signs of tachycardia and tachypnea.  Reported fevers of 100.4 orally at home.  Presentation most concerning for sepsis with pulmonary source.  She does have a history of significant peripheral edema.  Will provide IV fluids but will tread lightly reassessing after the  initial liter for volume status and changes in hemodynamic stability.  Will obtain sepsis workup and cover with antibiotics.  Admission likely.  Amount and/or Complexity of Data Reviewed Labs: ordered. Radiology: ordered.  Risk Prescription drug management.           Final Clinical Impression(s) / ED Diagnoses Final diagnoses:  HCAP (healthcare-associated pneumonia)  Shortness of breath  History of lung cancer    Rx / DC Orders ED Discharge Orders     None         Royanne Foots, DO 07/31/23 1724

## 2023-07-31 NOTE — Assessment & Plan Note (Addendum)
Left post obstructive pneumonia. In the setting of immunosuppression due to chemotherapy.  Severe sepsis with acute hypoxemic respiratory failure, (present on admission).   Admit patient to step down unit.  Plan to continue broad spectrum antibiotic therapy with IV ceftriaxone and oral azithromycin.  IV fluids with isotonic saline at 100 ml per hr For severe pneumonia, will add systemic corticosteroids with methylprednisolone 60 mg IV daily.  Add bronchodilator therapy and airway clearing techniques with flutter valve and incentive spirometer.  Antitussive agents with guaifenesin and dextromethorphan.  Follow up cultures, cell count and temperature curve.  Continue supplemental 02 per [place] to keep 02 saturation 92% or greater.   Echocardiogram from 09.05.24 with preserved LV systolic function EF 75%, with preserved RV systolic function. No significant valvular disease.  Patient may have diastolic heart failure, but more likely her edema is due to hypo-proteinemia related to malignancy.  Hold on diuretic therapy for now.    Continue metoprolol for now to avoid rebound tachycardia.

## 2023-08-01 DIAGNOSIS — E876 Hypokalemia: Secondary | ICD-10-CM

## 2023-08-01 DIAGNOSIS — L8992 Pressure ulcer of unspecified site, stage 2: Secondary | ICD-10-CM | POA: Diagnosis not present

## 2023-08-01 DIAGNOSIS — C3492 Malignant neoplasm of unspecified part of left bronchus or lung: Secondary | ICD-10-CM

## 2023-08-01 DIAGNOSIS — J181 Lobar pneumonia, unspecified organism: Secondary | ICD-10-CM | POA: Diagnosis not present

## 2023-08-01 DIAGNOSIS — L8990 Pressure ulcer of unspecified site, unspecified stage: Secondary | ICD-10-CM | POA: Insufficient documentation

## 2023-08-01 LAB — CBC
HCT: 26.3 % — ABNORMAL LOW (ref 36.0–46.0)
Hemoglobin: 7.9 g/dL — ABNORMAL LOW (ref 12.0–15.0)
MCH: 24.7 pg — ABNORMAL LOW (ref 26.0–34.0)
MCHC: 30 g/dL (ref 30.0–36.0)
MCV: 82.2 fL (ref 80.0–100.0)
Platelets: 448 10*3/uL — ABNORMAL HIGH (ref 150–400)
RBC: 3.2 MIL/uL — ABNORMAL LOW (ref 3.87–5.11)
RDW: 20.6 % — ABNORMAL HIGH (ref 11.5–15.5)
WBC: 5.9 10*3/uL (ref 4.0–10.5)
nRBC: 0 % (ref 0.0–0.2)

## 2023-08-01 LAB — BASIC METABOLIC PANEL
Anion gap: 10 (ref 5–15)
BUN: 8 mg/dL (ref 8–23)
CO2: 22 mmol/L (ref 22–32)
Calcium: 7.7 mg/dL — ABNORMAL LOW (ref 8.9–10.3)
Chloride: 100 mmol/L (ref 98–111)
Creatinine, Ser: 0.44 mg/dL (ref 0.44–1.00)
GFR, Estimated: >60 mL/min (ref >60)
Glucose, Bld: 85 mg/dL (ref 70–99)
Potassium: 2.9 mmol/L — ABNORMAL LOW (ref 3.5–5.1)
Sodium: 132 mmol/L — ABNORMAL LOW (ref 135–145)

## 2023-08-01 LAB — CORTISOL: Cortisol, Plasma: 24.5 ug/dL

## 2023-08-01 LAB — MRSA NEXT GEN BY PCR, NASAL: MRSA by PCR Next Gen: NOT DETECTED

## 2023-08-01 LAB — MAGNESIUM: Magnesium: 1.5 mg/dL — ABNORMAL LOW (ref 1.7–2.4)

## 2023-08-01 MED ORDER — IPRATROPIUM BROMIDE 0.02 % IN SOLN
0.5000 mg | Freq: Three times a day (TID) | RESPIRATORY_TRACT | Status: DC
  Administered 2023-08-02 – 2023-08-06 (×13): 0.5 mg via RESPIRATORY_TRACT
  Filled 2023-08-01 (×14): qty 2.5

## 2023-08-01 MED ORDER — ENSURE ENLIVE PO LIQD
237.0000 mL | Freq: Two times a day (BID) | ORAL | Status: DC
  Administered 2023-08-02 – 2023-08-03 (×3): 237 mL via ORAL

## 2023-08-01 MED ORDER — LEVALBUTEROL HCL 0.63 MG/3ML IN NEBU
0.6300 mg | INHALATION_SOLUTION | RESPIRATORY_TRACT | Status: DC | PRN
  Administered 2023-08-01 – 2023-08-03 (×2): 0.63 mg via RESPIRATORY_TRACT
  Filled 2023-08-01 (×2): qty 3

## 2023-08-01 MED ORDER — FUROSEMIDE 10 MG/ML IJ SOLN: 40.0000 mg | Freq: Every day | INTRAMUSCULAR | Status: DC

## 2023-08-01 MED ORDER — METRONIDAZOLE 500 MG/100ML IV SOLN
500.0000 mg | Freq: Two times a day (BID) | INTRAVENOUS | Status: DC
  Administered 2023-08-01 – 2023-08-03 (×4): 500 mg via INTRAVENOUS
  Filled 2023-08-01 (×5): qty 100

## 2023-08-01 MED ORDER — LEVALBUTEROL HCL 0.63 MG/3ML IN NEBU
0.6300 mg | INHALATION_SOLUTION | Freq: Three times a day (TID) | RESPIRATORY_TRACT | Status: DC
  Administered 2023-08-02 – 2023-08-06 (×13): 0.63 mg via RESPIRATORY_TRACT
  Filled 2023-08-01 (×13): qty 3

## 2023-08-01 MED ORDER — LEVALBUTEROL HCL 0.63 MG/3ML IN NEBU
0.6300 mg | INHALATION_SOLUTION | Freq: Three times a day (TID) | RESPIRATORY_TRACT | Status: DC
  Administered 2023-08-01: 0.63 mg via RESPIRATORY_TRACT
  Filled 2023-08-01: qty 3

## 2023-08-01 MED ORDER — IPRATROPIUM BROMIDE 0.02 % IN SOLN
0.5000 mg | Freq: Four times a day (QID) | RESPIRATORY_TRACT | Status: DC
  Administered 2023-08-01 (×2): 0.5 mg via RESPIRATORY_TRACT
  Filled 2023-08-01 (×2): qty 2.5

## 2023-08-01 MED ORDER — SODIUM CHLORIDE 0.9 % IV SOLN
2.0000 g | INTRAVENOUS | Status: DC
  Administered 2023-08-01 – 2023-08-06 (×6): 2 g via INTRAVENOUS
  Filled 2023-08-01 (×6): qty 20

## 2023-08-01 MED ORDER — POTASSIUM CHLORIDE CRYS ER 20 MEQ PO TBCR
40.0000 meq | EXTENDED_RELEASE_TABLET | ORAL | Status: AC
  Administered 2023-08-01 (×3): 40 meq via ORAL
  Filled 2023-08-01 (×3): qty 2

## 2023-08-01 MED ORDER — ALBUMIN HUMAN 25 % IV SOLN
25.0000 g | Freq: Every day | INTRAVENOUS | Status: DC
  Administered 2023-08-01 – 2023-08-02 (×2): 25 g via INTRAVENOUS
  Filled 2023-08-01 (×2): qty 100

## 2023-08-01 MED ORDER — MIDODRINE HCL 5 MG PO TABS
5.0000 mg | ORAL_TABLET | Freq: Two times a day (BID) | ORAL | Status: DC
  Administered 2023-08-01 – 2023-08-07 (×13): 5 mg via ORAL
  Filled 2023-08-01 (×12): qty 1

## 2023-08-01 MED ORDER — MAGNESIUM SULFATE 2 GM/50ML IV SOLN
2.0000 g | Freq: Once | INTRAVENOUS | Status: AC
  Administered 2023-08-01: 2 g via INTRAVENOUS
  Filled 2023-08-01: qty 50

## 2023-08-01 NOTE — Progress Notes (Signed)
Patient and patient's husband changed their mind about CODE STATUS.  Now wants to be full code.  CODE STATUS changed.  Patient's RN notified via secure chat

## 2023-08-01 NOTE — Plan of Care (Signed)
  Problem: Acute Rehab PT Goals(only PT should resolve) Goal: Pt Will Go Supine/Side To Sit Outcome: Progressing Flowsheets (Taken 08/01/2023 1156) Pt will go Supine/Side to Sit:  with contact guard assist  with minimal assist Goal: Patient Will Transfer Sit To/From Stand Outcome: Progressing Flowsheets (Taken 08/01/2023 1156) Patient will transfer sit to/from stand:  with contact guard assist  with minimal assist Goal: Pt Will Transfer Bed To Chair/Chair To Bed Outcome: Progressing Flowsheets (Taken 08/01/2023 1156) Pt will Transfer Bed to Chair/Chair to Bed:  with contact guard assist  with min assist Goal: Pt Will Ambulate Outcome: Progressing Flowsheets (Taken 08/01/2023 1156) Pt will Ambulate:  25 feet  with contact guard assist  with minimal assist  with rolling walker   11:57 AM, 08/01/23 Miranda Perkins, MPT Physical Therapist with [facility] [phone] office [phone] mobile phone

## 2023-08-01 NOTE — TOC Initial Note (Signed)
Transition of Care [facility]) - Initial/Assessment Note    Patient Details  Name: Miranda Perkins MRN: [id number] Date of Birth: Oct 23, 1961  Transition of Care (TOC) CM/SW Contact:    Catalina Gravel, LCSW Phone Number: 08/01/2023, 12:08 PM  Clinical Narrative:                 Pt is high risk for readmission. CSW completed assessment with pt and  spouse while he was present at bedside with her. Patient lives at home with her husband, son and grandson. She has a cane and walker, no other equipment needed. Active with palliative care and HHPT. Family was not sure on provider name. Pt wants to continue with PT as recommended, and with same provider.  CSW advised that she will request orders form DR.   CSW reviewed noted, [facility] last referral.  Contacted provider asking if new orders needed. TOC to continue to follow.    Expected Discharge Plan: Home w Home Health Services Barriers to Discharge: Continued Medical Work up   Patient Goals and CMS Choice Patient states their goals for this hospitalization and ongoing recovery are:: Return home with [facility] and Palliative          Expected Discharge Plan and Services In-house Referral: Clinical Social Work   Post Acute Care Choice: Home Health                                 Date [facility] Agency Contacted: 08/01/23 Time HH Agency Contacted: 1206 Representative spoke with at [facility] Agency: Morrie Sheldon  Prior Living Arrangements/Services   Lives with:: Spouse Patient language and need for interpreter reviewed:: Yes Do you feel safe going back to the place where you live?: Yes      Need for Family Participation in Patient Care: Yes (Comment) Care giver support system in place?: Yes (comment) Current home services: DME Criminal Activity/Legal Involvement Pertinent to Current Situation/Hospitalization: No - Comment as needed  Activities of Daily Living   ADL Screening (condition at time of admission) Does the patient have a NEW difficulty  with bathing/dressing/toileting/self-feeding that is expected to last >3 days?: No Does the patient have a NEW difficulty with getting in/out of bed, walking, or climbing stairs that is expected to last >3 days?: No Does the patient have a NEW difficulty with communication that is expected to last >3 days?: No Is the patient deaf or have difficulty hearing?: No Does the patient have difficulty seeing, even when wearing glasses/contacts?: No Does the patient have difficulty concentrating, remembering, or making decisions?: No  Permission Sought/Granted                  Emotional Assessment              Admission diagnosis:  Shortness of breath [R06.02] Pneumonia [J18.9] History of lung cancer [Z85.118] HCAP (healthcare-associated pneumonia) [J18.9] Severe sepsis (HCC) [A41.9, R65.20] Patient Active Problem List   Diagnosis Date Noted   Pressure injury of skin 08/01/2023   Left lower lobe consolidation (HCC) 07/31/2023   PNA (pneumonia) 07/06/2023   Severe sepsis (HCC) 07/06/2023   Metastatic primary lung cancer, left (HCC) 06/23/2023   Class II obesity 06/22/2023   Leukocytosis 06/21/2023   Ileus (HCC) 06/20/2023   Pulmonary nodule 06/14/2023   Thyroid nodule 06/11/2023   Intractable nausea and vomiting 06/11/2023   Hypokalemia 06/11/2023   Transaminitis 06/11/2023   recent history of C. 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/2023   Class 3 severe obesity due to excess calories with serious comorbidity and body mass index (BMI) of 40.0 to 44.9 in adult (HCC) 12/29/2021   DOE (dyspnea on exertion) 03/20/2021   Upper airway cough syndrome vs cough variant asthma 03/20/2021   Elevated testosterone level in female 12/20/2020   Female hirsutism 12/20/2020   Hx of total knee arthroplasty, left 01/26/2018    Spinal stenosis of cervical region 09/27/2017   Insomnia 09/14/2016   BMI 39.0-39.9,adult 09/14/2016   Swelling of both lower extremities 11/28/2015   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 [place] [address] Phone: [phone] Fax: [phone]     Social Determinants of Health (SDOH) Social History: SDOH Screenings   Food Insecurity: No Food Insecurity (08/01/2023)  Housing: Patient Declined (08/01/2023)  Transportation Needs: No Transportation Needs (08/01/2023)  Utilities: Not At Risk (08/01/2023)  Alcohol Screen: Low Risk  (04/27/2023)  Depression (PHQ2-9): Low Risk  (04/15/2023)  Financial Resource Strain: Low Risk  (06/09/2023)  Physical Activity: Inactive (06/09/2023)  Social Connections: Unknown (06/09/2023)  Recent Concern: Social Connections - Moderately Isolated (04/27/2023)  Stress: Stress Concern Present (06/09/2023)  Tobacco Use: Medium Risk (07/31/2023)   SDOH Interventions:     Readmission Risk Interventions    08/01/2023   12:00 PM 07/07/2023    1:50 PM  Readmission Risk Prevention Plan  Transportation Screening Complete Complete  PCP or Specialist Appt within 3-5 Days  Not Complete  HRI or Home Care Consult  Complete  Social Work Consult for Recovery Care Planning/Counseling  Complete  Palliative Care Screening  Complete  Medication Review Oceanographer) Complete Complete  PCP or Specialist appointment within 3-5 days of discharge Complete   HRI or Home Care Consult Complete   SW Recovery Care/Counseling Consult Complete   Palliative Care Screening Complete   Skilled Nursing Facility Not Applicable

## 2023-08-01 NOTE — Evaluation (Signed)
Physical Therapy Evaluation Patient Details Name: Miranda Perkins MRN: [id number] DOB: [birth year] Today's Date: 08/01/2023  History of Present Illness  Miranda Perkins is a 62 y.o. 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    Recent hospitalization 09/3 to 07/11/23 for post obstructive pneumonia complicated with severe sepsis.     Patient has been getting palliative chemotherapy, started on 07/14/23.      Over last 3 to 4 days, patient has been experiencing productive cough with thick sputum, worsening dyspnea and pleuritic chest pain. Generalized malaise and for the last 2 nights fever.  She also noted worsening lower extremity edema, apparently she has not been taking prescribed furosemide.   09/25 Oncology nurse was contacted by patients daughter, regarding worsening lower extremity edema, dyspnea, fever and hypoxia (02 saturation 88% on room air). She was advised to take patient to the ED. At home she has been using albuterol with no improvement in her symptoms.   Clinical Impression  Patient demonstrates slow labored movement for sitting up at bedside with difficulty using left and due to IV insertion, fair/good return for taking steps in room using RW and patient's spouse demonstrates good return for assisting patient for sit to stands and transferring to/from commode.  Patient tolerated sitting up on raised toilet seat with her spouse assisting after therapy.  Patient will benefit from continued skilled physical therapy in hospital and recommended venue below to increase strength, balance, endurance for safe ADLs and gait.          If plan is discharge home, recommend the following: A little help with walking and/or transfers;A little help with bathing/dressing/bathroom;Help with stairs or ramp for entrance;Assistance with cooking/housework   Can  travel by private vehicle        Equipment Recommendations None recommended by PT  Recommendations for Other Services       Functional Status Assessment Patient has had a recent decline in their functional status and demonstrates the ability to make significant improvements in function in a reasonable and predictable amount of time.     Precautions / Restrictions Precautions Precautions: Fall Restrictions Weight Bearing Restrictions: No      Mobility  Bed Mobility Overal bed mobility: Needs Assistance Bed Mobility: Supine to Sit     Supine to sit: Min assist, Mod assist, HOB elevated     General bed mobility comments: increased time, labored movement with diffiuclty using RUE due to IV insertion in hand    Transfers Overall transfer level: Needs assistance Equipment used: Rolling walker (2 wheels) Transfers: Sit to/from Stand, Bed to chair/wheelchair/BSC Sit to Stand: Min assist   Step pivot transfers: Min assist       General transfer comment: able to complete sit to stands buy pulling self to standing using bilateral hand held assist, fair/good return for transferring to/from chair and commode    Ambulation/Gait Ambulation/Gait assistance: Min assist Gait Distance (Feet): 12 Feet Assistive device: Rolling walker (2 wheels) Gait Pattern/deviations: Decreased step length - right, Decreased step length - left, Decreased stride length Gait velocity: decreased     General Gait Details: limited to a few slow labored steps in room without loss of balance mostly due to c/o fatigue  Stairs            Wheelchair Mobility     Tilt Bed    Modified Rankin (Stroke Patients Only)  Balance Overall balance assessment: Needs assistance Sitting-balance support: Feet supported, No upper extremity supported Sitting balance-Leahy Scale: Fair Sitting balance - Comments: fair/good seated at EOB   Standing balance support: Reliant on assistive device for  balance, During functional activity, Bilateral upper extremity supported Standing balance-Leahy Scale: Fair Standing balance comment: using RW                             Pertinent Vitals/Pain Pain Assessment Pain Assessment: No/denies pain    Home Living Family/patient expects to be discharged to:: Private residence Living Arrangements: Spouse/significant other Available Help at Discharge: Family;Available 24 hours/day Type of Home: House Home Access: Stairs to enter Entrance Stairs-Rails: Can reach both Entrance Stairs-Number of Steps: 5   Home Layout: One level Home Equipment: Educational psychologist (2 wheels);BSC/3in1;Shower seat - built in      Prior Function Prior Level of Function : Needs assist       Physical Assist : Mobility (physical);ADLs (physical) Mobility (physical): Bed mobility;Transfers;Gait;Stairs   Mobility Comments: household ambulation using RW ADLs Comments: Assisted by family     Extremity/Trunk Assessment   Upper Extremity Assessment Upper Extremity Assessment: Generalized weakness    Lower Extremity Assessment Lower Extremity Assessment: Generalized weakness    Cervical / Trunk Assessment Cervical / Trunk Assessment: Normal  Communication   Communication Communication: No apparent difficulties  Cognition Arousal: Alert Behavior During Therapy: WFL for tasks assessed/performed, Anxious Overall Cognitive Status: Within Functional Limits for tasks assessed                                          General Comments      Exercises     Assessment/Plan    PT Assessment Patient needs continued PT services  PT Problem List Decreased strength;Decreased activity tolerance;Decreased balance;Decreased mobility       PT Treatment Interventions DME instruction;Gait training;Stair training;Functional mobility training;Therapeutic activities;Therapeutic exercise;Balance training;Patient/family education     PT Goals (Current goals can be found in the Care Plan section)  Acute Rehab PT Goals Patient Stated Goal: return home with family to assist PT Goal Formulation: With patient/family Time For Goal Achievement: 07/28/23 Potential to Achieve Goals: Good    Frequency Min 3X/week     Co-evaluation               AM-PAC PT "6 Clicks" Mobility  Outcome Measure Help needed turning from your back to your side while in a flat bed without using bedrails?: A Little Help needed moving from lying on your back to sitting on the side of a flat bed without using bedrails?: A Lot Help needed moving to and from a bed to a chair (including a wheelchair)?: A Little Help needed standing up from a chair using your arms (e.g., wheelchair or bedside chair)?: A Little Help needed to walk in hospital room?: A Little Help needed climbing 3-5 steps with a railing? : A Lot 6 Click Score: 16    End of Session   Activity Tolerance: Patient tolerated treatment well;Patient limited by fatigue Patient left: Other (comment);with call bell/phone within reach;with family/visitor present (left seated on commode) Nurse Communication: Mobility status PT Visit Diagnosis: Unsteadiness on feet (R26.81);Other abnormalities of gait and mobility (R26.89);Muscle weakness (generalized) (M62.81)    Time: 1030-1058 PT Time Calculation (min) (ACUTE ONLY): 28 min   Charges:  PT Evaluation $PT Eval Moderate Complexity: 1 Mod PT Treatments $Therapeutic Activity: 23-37 mins PT General Charges $$ ACUTE PT VISIT: 1 Visit         11:53 AM, 08/01/23 Ocie Bob, MPT Physical Therapist with [facility] [phone] office [phone] mobile phone

## 2023-08-01 NOTE — Progress Notes (Signed)
PROGRESS NOTE  Miranda Perkins KGM:[id number] DOB: 1960/12/25   PCP: [name], DO  Patient is from: Home.  Lives with husband.  Uses rolling walker at baseline.  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  She was started on ceftriaxone, azithromycin and IV fluid.   Principal Problem:   Left lower lobe consolidation (HCC) Active Problems:   Hypokalemia   Metastatic primary lung cancer, left (HCC)   Class 1 obesity   Pressure injury of skin   Subjective: Seen and examined earlier this morning.  No major events overnight of this morning.  Reports improvement in her breathing.  Still with productive cough.  Sputum is clear and thick.  Small blood tinge occasionally.  Last bowel movement this morning.  Denies melena or hematochezia.  Reports poor p.o. intake.  Objective: Vitals:   08/01/23 0911 08/01/23 1100 08/01/23 1140 08/01/23 1200  BP:  (!) 110/56  (!) 102/57  Pulse:  (!) 117  (!) 104  Resp:  16  19  Temp:   97.8 F (36.6 C)   TempSrc:   Axillary   SpO2: 97% 93%  93%  Weight:      Height:        Examination:  GENERAL: No apparent distress.  Nontoxic. HEENT: MMM.  Vision and hearing grossly intact.  NECK: Supple.  No apparent JVD.  RESP:  No IWOB.  Diminished aeration in left lung. CVS:  RRR. Heart sounds normal.  ABD/GI/GU: BS+. Abd soft, NTND.  MSK/EXT:  Moves extremities.  Significant BLE edema. SKIN: no apparent skin  lesion or wound NEURO: Awake, alert and oriented appropriately.  No apparent focal neuro deficit. PSYCH: Calm. Normal affect.   Procedures:  None  Microbiology summarized: COVID-19, influenza and RSV PCR nonreactive MRSA PCR screen nonreactive Blood cultures NGTD  Assessment and plan: Principal Problem:   Left lower lobe consolidation (HCC) Active Problems:   Hypokalemia   Metastatic primary lung cancer, left (HCC)   Class 1 obesity   Pressure injury of skin  Left lower lobe consolidation: Unclear if this is pneumonia or postobstructive process from underlying left lung cancer.  CXR shows left healer mass with left lower lobe consolidation.  This does not seem to be new.  She was recently treated for postobstructive pneumonia in the same region.  Weaned off oxygen to room air.  Maintain saturation in mid 90s. -Continue IV ceftriaxone -Change Zithromax to IV Flagyl for anaerobic coverage -Discontinue steroid.  She already has remarkable edema -Continue supportive care with nebulizers, mucolytic's and antitussive -Trial of IV Lasix with IV albumin -Encourage incentive spirometry -Consider CT angio chest if worse or no improvement.  Metastatic primary lung cancer, left-with mets to right adrenal gland.  Seems she has completed radiation for right adrenal mets.  Started chemotherapy on 9/11.  Unclear if this is curative or palliative based on last oncology note although it seems to be palliative per H&P.  Patient and family not clear on this.  She is  followed by Dr. Ellin Saba. -Added Dr. Ellin Saba to treatment team  Goal of care counseling: Patient was admitted as full code that she confirmed with me.  However, after we discussed pros and cons of CPR and intubation with patient and patient's husband in light of her comorbidity, she elected to be DNR and DNI.  -Changed CODE STATUS to DNR/DNI -RN notified.  Anemia of chronic disease: Drop in Hgb likely dilutional from IV fluid.   Patient denies melena or hematochezia.  Occasional small blood tinge sputum when she coughs.  She denies frank hemoptysis. Recent Labs    07/01/23 1305 07/06/23 1305 07/07/23 0436 07/08/23 0430 07/09/23 0432 07/10/23 0417 07/14/23 0906 07/22/23 0843 07/31/23 1529 08/01/23 0626  HGB 10.9* 11.7* 9.6* 9.3* 9.3* 9.6* 10.2* 9.4* 9.4* 7.9*  -Discontinue IV fluids -IV diuretics as above -Recheck in the morning  Sinus tachycardia: HR in 110s.  Multifactorial including anemia, deconditioning.  Recent TSH normal.  Recent echo without significant finding. -Continue home metoprolol -Change DuoNeb to Xopenex with Atrovent -Optimize electrolytes  Hypotension: Mild.  Systolic in 40s and 50s. -Increased home midodrine from 5 mg twice daily to 3 times daily.   Hypokalemia/hypomagnesemia -Monitor replenish as appropriate  Mild hyponatremia: likely hypervolemic. -IV Lasix as above  Physical deconditioning: Lives with husband.  Uses rolling walker at baseline. -PT/OT eval  Decreased oral intake: Likely from underlying cancer chemotherapy. -Consult dietitian   Class II obesity Body mass index is 36.86 kg/m.    Pressure skin injury: POA Pressure Injury 08/01/23 Buttocks Left Stage 2 -  Partial thickness loss of dermis presenting as a shallow open injury with a red, pink wound bed without slough. opening in the skin supposedly looks like Moisture Associated skin tear (Active)  08/01/23 0131  Location: Buttocks  Location Orientation: Left  Staging: Stage 2 -  Partial thickness loss of dermis presenting as a shallow open injury with a red, pink wound bed without slough.  Wound Description (Comments): opening in the skin supposedly looks like Moisture Associated skin tear  Present on Admission:   Dressing Type Foam - Lift dressing to assess site every shift 08/01/23 0815   DVT prophylaxis:  enoxaparin (LOVENOX) injection 40 mg Start: 08/01/23 1000 SCDs Start: 07/31/23 2202  Code  Status: DNR/DNI Family Communication: Updated patient's husband at bedside Level of care: Stepdown Status is: Inpatient Remains inpatient appropriate because: Due to left lung consolidation, respiratory distress   Final disposition: TBD Consultants:  Oncology  55 minutes with more than 50% spent in reviewing records, counseling patient/family and coordinating care.   Sch Meds:  Scheduled Meds:  Chlorhexidine Gluconate Cloth  6 each Topical Q0600   enoxaparin (LOVENOX) injection  40 mg Subcutaneous Q24H   feeding supplement  237 mL Oral BID BM   [START ON 08/04/2023] furosemide  40 mg Intravenous Daily   levalbuterol  0.63 mg Nebulization Q8H   And   ipratropium  0.5 mg Nebulization Q6H   metoprolol tartrate  25 mg Oral BID   midodrine  5 mg Oral BID WC   potassium chloride  40 mEq Oral Q3H   Continuous Infusions:  albumin human 25 g (08/01/23 1139)   cefTRIAXone (ROCEPHIN)  IV Stopped (08/01/23 0030)   magnesium sulfate bolus IVPB     metronidazole     PRN Meds:.acetaminophen **OR** acetaminophen, guaiFENesin-dextromethorphan, levalbuterol, ondansetron **OR** ondansetron (ZOFRAN) IV  Antimicrobials: Anti-infectives (From admission, onward)    Start     Dose/Rate Route Frequency Ordered Stop   08/01/23 1300  metroNIDAZOLE (FLAGYL) IVPB 500 mg        500 mg 100 mL/hr over 60 Minutes Intravenous Every 12 hours 08/01/23 1205 08/06/23 1259   08/01/23 1000  azithromycin (ZITHROMAX) tablet 500 mg  Status:  Discontinued        500 mg Oral Daily 07/31/23 2202 08/01/23 1205   07/31/23 2300  cefTRIAXone (ROCEPHIN) 1 g in sodium chloride 0.9 % 100 mL IVPB        1 g 200 mL/hr over 30 Minutes Intravenous Every 24 hours 07/31/23 2202     07/31/23 1630  vancomycin (VANCOREADY) IVPB 2000 mg/400 mL        2,000 mg 200 mL/hr over 120 Minutes Intravenous  Once 07/31/23 1545 07/31/23 2016   07/31/23 1600  ceFEPIme (MAXIPIME) 2 g in sodium chloride 0.9 % 100 mL IVPB  Status:  Discontinued         2 g 200 mL/hr over 30 Minutes Intravenous Every 8 hours 07/31/23 1543 07/31/23 2202   07/31/23 1530  vancomycin (VANCOCIN) IVPB 1000 mg/200 mL premix  Status:  Discontinued        1,000 mg 200 mL/hr over 60 Minutes Intravenous  Once 07/31/23 1529 07/31/23 1545   07/31/23 1530  aztreonam (AZACTAM) 2 g in sodium chloride 0.9 % 100 mL IVPB  Status:  Discontinued        2 g 200 mL/hr over 30 Minutes Intravenous  Once 07/31/23 1529 07/31/23 1542        I have personally reviewed the following labs and images: CBC: Recent Labs  Lab 07/31/23 1529 08/01/23 0626  WBC 7.0 5.9  NEUTROABS 4.6  --   HGB 9.4* 7.9*  HCT 30.9* 26.3*  MCV 81.3 82.2  PLT 471* 448*   BMP &GFR Recent Labs  Lab 07/31/23 1529 08/01/23 0626  NA 132* 132*  K 2.8* 2.9*  CL 97* 100  CO2 24 22  GLUCOSE 105* 85  BUN 9 8  CREATININE 0.49 0.44  CALCIUM 8.2* 7.7*  MG  --  1.5*   Estimated Creatinine Clearance: 86.4 mL/min (by C-G formula based on SCr of 0.44 mg/dL). Liver & Pancreas: Recent Labs  Lab 07/31/23 1529  AST 22  ALT 23  ALKPHOS 112  BILITOT 0.7  PROT 5.7*  ALBUMIN 2.1*   No results for input(s): "LIPASE", "AMYLASE" in the last 168 hours. No results for input(s): "AMMONIA" in the last 168 hours. Diabetic: No results for input(s): "HGBA1C" in the last 72 hours. No results for input(s): "GLUCAP" in the last 168 hours. Cardiac Enzymes: No results for input(s): "CKTOTAL", "CKMB", "CKMBINDEX", "TROPONINI" in the last 168 hours. No results for input(s): "PROBNP" in the last 8760 hours. Coagulation Profile: Recent Labs  Lab 07/31/23 1529  INR 1.3*   Thyroid Function Tests: No results for input(s): "TSH", "T4TOTAL", "FREET4", "T3FREE", "THYROIDAB" in the last 72 hours. Lipid Profile: No results for input(s): "CHOL", "HDL", "LDLCALC", "TRIG", "CHOLHDL", "LDLDIRECT" in the last 72 hours. Anemia Panel: No results for input(s): "VITAMINB12", "FOLATE", "FERRITIN", "TIBC", "IRON",  "RETICCTPCT" in the last 72 hours. Urine analysis:    Component Value Date/Time   COLORURINE YELLOW 07/31/2023 1900   APPEARANCEUR HAZY (A) 07/31/2023 1900   APPEARANCEUR Clear 04/06/2023 1106   LABSPEC 1.013 07/31/2023 1900   PHURINE 7.0 07/31/2023 1900   GLUCOSEU NEGATIVE 07/31/2023 1900   HGBUR SMALL (A) 07/31/2023 1900   BILIRUBINUR NEGATIVE 07/31/2023 1900   BILIRUBINUR Negative 04/06/2023 1106   KETONESUR NEGATIVE 07/31/2023 1900  PROTEINUR NEGATIVE 07/31/2023 1900   NITRITE NEGATIVE 07/31/2023 1900   LEUKOCYTESUR NEGATIVE 07/31/2023 1900   Sepsis Labs: Invalid input(s): "PROCALCITONIN", "LACTICIDVEN"  Microbiology: Recent Results (from the past 240 hour(s))  Culture, blood (Routine x 2)     Status: None (Preliminary result)   Collection Time: 07/31/23  3:29 PM   Specimen: BLOOD RIGHT WRIST  Result Value Ref Range Status   Specimen Description BLOOD RIGHT WRIST  Final   Special Requests Blood Culture adequate volume  Final   Culture   Final    NO GROWTH < 24 HOURS Performed at [facility], [address] [place], [place] [address]    Report Status PENDING  Incomplete  Culture, blood (Routine x 2)     Status: None (Preliminary result)   Collection Time: 07/31/23  3:29 PM   Specimen: Right Antecubital; Blood  Result Value Ref Range Status   Specimen Description RIGHT ANTECUBITAL  Final   Special Requests Blood Culture adequate volume  Final   Culture   Final    NO GROWTH < 24 HOURS Performed at [facility], [address], [place], [place] [address]    Report Status PENDING  Incomplete  Resp panel by RT-PCR (RSV, Flu A&B, Covid) Anterior Nasal Swab     Status: None   Collection Time: 07/31/23  7:00 PM   Specimen: Anterior Nasal Swab  Result Value Ref Range Status   SARS Coronavirus 2 by RT PCR NEGATIVE NEGATIVE Final    Comment: (NOTE) SARS-CoV-2 target nucleic acids are NOT DETECTED.  The SARS-CoV-2 RNA is generally detectable in upper  respiratory specimens during the acute phase of infection. The lowest concentration of SARS-CoV-2 viral copies this assay can detect is 138 copies/mL. A negative result does not preclude SARS-Cov-2 infection and should not be used as the sole basis for treatment or other patient management decisions. A negative result may occur with  improper specimen collection/handling, submission of specimen other than nasopharyngeal swab, presence of viral mutation(s) within the areas targeted by this assay, and inadequate number of viral copies(<138 copies/mL). A negative result must be combined with clinical observations, patient history, and epidemiological information. The expected result is Negative.  Fact Sheet for Patients:  BloggerCourse.com  Fact Sheet for Healthcare Providers:  SeriousBroker.it  This test is no t yet approved or cleared by the [place] FDA and  has been authorized for detection and/or diagnosis of SARS-CoV-2 by FDA under an Emergency Use Authorization (EUA). This EUA will remain  in effect (meaning this test can be used) for the duration of the COVID-19 declaration under Section 564(b)(1) of the Act, 21 U.S.C.section 360bbb-3(b)(1), unless the authorization is terminated  or revoked sooner.       Influenza A by PCR NEGATIVE NEGATIVE Final   Influenza B by PCR NEGATIVE NEGATIVE Final    Comment: (NOTE) The Xpert Xpress SARS-CoV-2/FLU/RSV plus assay is intended as an aid in the diagnosis of influenza from Nasopharyngeal swab specimens and should not be used as a sole basis for treatment. Nasal washings and aspirates are unacceptable for Xpert Xpress SARS-CoV-2/FLU/RSV testing.  Fact Sheet for Patients: BloggerCourse.com  Fact Sheet for Healthcare Providers: SeriousBroker.it  This test is not yet approved or cleared by the [place] FDA and has been  authorized for detection and/or diagnosis of SARS-CoV-2 by FDA under an Emergency Use Authorization (EUA). This EUA will remain in effect (meaning this test can be used) for the duration of the COVID-19 declaration under Section 564(b)(1) of  the Act, 21 U.S.C. section 360bbb-3(b)(1), unless the authorization is terminated or revoked.     Resp Syncytial Virus by PCR NEGATIVE NEGATIVE Final    Comment: (NOTE) Fact Sheet for Patients: BloggerCourse.com  Fact Sheet for Healthcare Providers: SeriousBroker.it  This test is not yet approved or cleared by the [place] FDA and has been authorized for detection and/or diagnosis of SARS-CoV-2 by FDA under an Emergency Use Authorization (EUA). This EUA will remain in effect (meaning this test can be used) for the duration of the COVID-19 declaration under Section 564(b)(1) of the Act, 21 U.S.C. section 360bbb-3(b)(1), unless the authorization is terminated or revoked.  Performed at [facility], [address] [place], [place] [address]   MRSA Next Gen by PCR, Nasal     Status: None   Collection Time: 07/31/23  7:00 PM   Specimen: Anterior Nasal Swab  Result Value Ref Range Status   MRSA by PCR Next Gen NOT DETECTED NOT DETECTED Final    Comment: (NOTE) The GeneXpert MRSA Assay (FDA approved for NASAL specimens only), is one component of a comprehensive MRSA colonization surveillance program. It is not intended to diagnose MRSA infection nor to guide or monitor treatment for MRSA infections. Test performance is not FDA approved in patients less than 6 years old. Performed at [facility], [address], [place], [place] [address]   MRSA Next Gen by PCR, Nasal     Status: None   Collection Time: 08/01/23  1:16 AM   Specimen: Nasal Mucosa; Nasal Swab  Result Value Ref Range Status   MRSA by PCR Next Gen NOT DETECTED NOT DETECTED Final    Comment: (NOTE) The GeneXpert MRSA Assay  (FDA approved for NASAL specimens only), is one component of a comprehensive MRSA colonization surveillance program. It is not intended to diagnose MRSA infection nor to guide or monitor treatment for MRSA infections. Test performance is not FDA approved in patients less than 21 years old. Performed at [facility], [address] [place], [place] [address]     Radiology Studies: DG Chest Portable 1 View  Result Date: 07/31/2023 CLINICAL DATA:  Shortness of breath.  Lung cancer. EXAM: PORTABLE CHEST 1 VIEW COMPARISON:  07/06/2023 FINDINGS: Right lung clear. Left parahilar mass again noted. Left base collapse/consolidation is similar to prior with small left pleural effusion. Left Port-A-Cath remains in place. No acute bony abnormality. Telemetry leads overlie the chest. IMPRESSION: No substantial interval change. Persistent left base collapse/consolidation with small left effusion. Electronically Signed   By: Kennith Center M.D.   On: 07/31/2023 16:25      Corrine Tillis T. Treylin Burtch [facility]  If 7PM-7AM, please contact night-coverage [email or web address] 08/01/2023, 12:06 PM

## 2023-08-01 NOTE — Plan of Care (Signed)

## 2023-08-02 ENCOUNTER — Inpatient Hospital Stay: Admitting: Hematology

## 2023-08-02 ENCOUNTER — Inpatient Hospital Stay

## 2023-08-02 DIAGNOSIS — J181 Lobar pneumonia, unspecified organism: Secondary | ICD-10-CM

## 2023-08-02 LAB — CBC
HCT: 25.9 % — ABNORMAL LOW (ref 36.0–46.0)
Hemoglobin: 7.8 g/dL — ABNORMAL LOW (ref 12.0–15.0)
MCH: 25.2 pg — ABNORMAL LOW (ref 26.0–34.0)
MCHC: 30.1 g/dL (ref 30.0–36.0)
MCV: 83.5 fL (ref 80.0–100.0)
Platelets: 555 10*3/uL — ABNORMAL HIGH (ref 150–400)
RBC: 3.1 MIL/uL — ABNORMAL LOW (ref 3.87–5.11)
RDW: 21.5 % — ABNORMAL HIGH (ref 11.5–15.5)
WBC: 7.4 10*3/uL (ref 4.0–10.5)
nRBC: 0 % (ref 0.0–0.2)

## 2023-08-02 LAB — BASIC METABOLIC PANEL
Anion gap: 8 (ref 5–15)
BUN: 10 mg/dL (ref 8–23)
CO2: 23 mmol/L (ref 22–32)
Calcium: 8 mg/dL — ABNORMAL LOW (ref 8.9–10.3)
Chloride: 104 mmol/L (ref 98–111)
Creatinine, Ser: 0.44 mg/dL (ref 0.44–1.00)
GFR, Estimated: >60 mL/min (ref >60)
Glucose, Bld: 161 mg/dL — ABNORMAL HIGH (ref 70–99)
Potassium: 3.4 mmol/L — ABNORMAL LOW (ref 3.5–5.1)
Sodium: 135 mmol/L (ref 135–145)

## 2023-08-02 LAB — C-REACTIVE PROTEIN: CRP: 4 mg/dL — ABNORMAL HIGH (ref <1.0)

## 2023-08-02 LAB — PROCALCITONIN: Procalcitonin: 2.42 ng/mL

## 2023-08-02 MED ORDER — SODIUM CHLORIDE 3 % IN NEBU
4.0000 mL | INHALATION_SOLUTION | Freq: Two times a day (BID) | RESPIRATORY_TRACT | Status: AC
  Administered 2023-08-02 – 2023-08-03 (×3): 4 mL via RESPIRATORY_TRACT
  Filled 2023-08-02 (×4): qty 4

## 2023-08-02 MED ORDER — POTASSIUM CHLORIDE CRYS ER 20 MEQ PO TBCR: 40.0000 meq | EXTENDED_RELEASE_TABLET | Freq: Four times a day (QID) | ORAL | Status: DC

## 2023-08-02 MED ORDER — DM-GUAIFENESIN ER 30-600 MG PO TB12
1.0000 | ORAL_TABLET | Freq: Two times a day (BID) | ORAL | Status: DC
  Administered 2023-08-02 – 2023-08-04 (×5): 1 via ORAL
  Filled 2023-08-02 (×5): qty 1

## 2023-08-02 MED ORDER — ENOXAPARIN SODIUM 60 MG/0.6ML IJ SOSY
0.5000 mg/kg | PREFILLED_SYRINGE | INTRAMUSCULAR | Status: DC
  Administered 2023-08-03: 50 mg via SUBCUTANEOUS
  Filled 2023-08-02: qty 0.6

## 2023-08-02 MED ORDER — METOPROLOL TARTRATE 50 MG PO TABS
50.0000 mg | ORAL_TABLET | Freq: Two times a day (BID) | ORAL | Status: DC
  Administered 2023-08-02 – 2023-08-06 (×8): 50 mg via ORAL
  Filled 2023-08-02 (×9): qty 1

## 2023-08-02 NOTE — Care Management Important Message (Signed)
Important Message  Patient Details  Name: Miranda Perkins MRN: [id number] Date of Birth: 1961-06-10   Important Message Given:  N/A - LOS <3 / Initial given by admissions     Corey Harold 08/02/2023, 9:56 AM

## 2023-08-02 NOTE — Consult Note (Signed)
PHARMACIST - PHYSICIAN COMMUNICATION  CONCERNING:  Enoxaparin (Lovenox) for DVT Prophylaxis   ASSESSMENT: Patient was prescribed enoxaparin 40 mg subcutaneously every 24 hours for VTE prophylaxis.   Body mass index is 36.12 kg/m.  Estimated Creatinine Clearance: 85.5 mL/min (by C-G formula based on SCr of 0.44 mg/dL).  Based on [facility] policy, patient qualifies for enoxaparin dosing of 0.5 mg per kilogram of total body weight every 24 hours because their body mass index is >30 kg/m2.  PLAN: Pharmacy has adjusted enoxaparin dose per [facility] policy.  Description: Patient is now receiving enoxaparin 0.5 mg/kg subcutaneously every 24 hours.  [name], PharmD Clinical Pharmacist 08/02/2023 2:41 PM

## 2023-08-02 NOTE — Progress Notes (Signed)
[facility] PROGRESS NOTE  Miranda Perkins (DOB: Jul 11, 1961) ZOX:[id number] PCP: [name], DO Outpatient Specialists: Oncology, Dr. Ellin Saba  Brief Narrative: Miranda Perkins is a 62 y.o. 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 She was admitted for severe sepsis in the setting of left lower lobe pneumonia.  She was started on ceftriaxone, azithromycin and given IV fluid.   Subjective: Short of breath with minimal exertion, swelling is returning to her recent baseline. No chest pain. In chair and really wants to get back in bed as soon as possible.   Objective: BP 113/66   Pulse (!) 116   Temp 98.6 F (37 C) (Oral)   Resp (!) 23   Ht 5\' 6"  (1.676 m)   Wt 101.5 kg   SpO2 97%   BMI 36.12 kg/m   Gen: No distress Pulm: Diminished L > R base, nonlabored but tachypneic  CV: Regular tachycardia without MRG, 1 to 2+ pitting LE edema GI: Soft, NT, ND, +BS Neuro: Alert and oriented. No new focal deficits. Ext: Warm, no deformities. Skin: No rashes, lesions or ulcers on visualized skin   Assessment & Plan: Left lower lobe consolidation: Unclear if this is pneumonia or postobstructive process from underlying left lung cancer. Mucous plugging is a possibility.  - Continue ceftriaxone, flagyl.  - Start hypertonic neb, mucinex, flutter valve. Consider CT. Consider chest PT.  - No wheezing, so hoping to avoid steroid with significant edema.  - Continue nebs.     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  - Started chemotherapy on 9/11.  Unclear if this is curative or palliative based on  last oncology note although it seems to be palliative per H&P. Dr. Kirtland Bouchard added to Tx team.    Goal of care counseling: Remains full code. Appropriate for ICU/SDU level of care.    Anemia of chronic disease: Drop in Hgb likely dilutional from IV fluid.  Patient denies melena or hematochezia.  Occasional small blood tinge sputum when she coughs.  She denies frank hemoptysis. - Repeat CBC today.    Peripheral edema: Suspect related to hypoalbuminemia and worsened with IV fluids.  - Repeat a day of albumin + lasix, monitor I/O, renal parameters.  - Echo from 9/5 reviewed, no significant CHF.   Sinus tachycardia: Multifactorial including anemia, deconditioning.  Recent TSH normal.  Recent echo without significant finding. - Tx infection as above, optimize volume status, monitor anemia.  -Continue home metoprolol, increase to 50mg  -Changed DuoNeb to Xopenex with Atrovent -Optimize electrolytes   Hypotension: Mild, resolved.  - Continue home midodrine 5mg  BID.   Hypokalemia/hypomagnesemia: Continues in light of loop diuretic.  - K 2.9 yesterday, will need recheck this AM, possibly augment supplementation   Mild hyponatremia: likely hypervolemic. -IV Lasix as above   Physical deconditioning: Lives with husband.  Uses rolling walker at baseline. -PT/OT evaluated, will arrange home health therapies. Needs to get OOB often to maintain strength   Decreased oral intake: Likely from underlying cancer chemotherapy. -Consult dietitian   Class II obesity Body mass index is 36.86 kg/m.  Stage 2 pressure injury of left buttocks, POA: Offload, optimize nutrition.  Tyrone Nine, MD [facility] [email or web address] 08/02/2023, 12:45 PM

## 2023-08-02 NOTE — Plan of Care (Signed)
  Problem: Education: Goal: Knowledge of General Education information will improve Description: Including pain rating scale, medication(s)/side effects and non-pharmacologic comfort measures Outcome: Progressing   Problem: Safety: Goal: Ability to remain free from injury will improve Outcome: Progressing   

## 2023-08-02 NOTE — Plan of Care (Signed)

## 2023-08-03 DIAGNOSIS — J181 Lobar pneumonia, unspecified organism: Secondary | ICD-10-CM | POA: Diagnosis not present

## 2023-08-03 DIAGNOSIS — E440 Moderate protein-calorie malnutrition: Secondary | ICD-10-CM | POA: Insufficient documentation

## 2023-08-03 LAB — CBC
HCT: 27.5 % — ABNORMAL LOW (ref 36.0–46.0)
Hemoglobin: 8.1 g/dL — ABNORMAL LOW (ref 12.0–15.0)
MCH: 24.4 pg — ABNORMAL LOW (ref 26.0–34.0)
MCHC: 29.5 g/dL — ABNORMAL LOW (ref 30.0–36.0)
MCV: 82.8 fL (ref 80.0–100.0)
Platelets: 550 10*3/uL — ABNORMAL HIGH (ref 150–400)
RBC: 3.32 MIL/uL — ABNORMAL LOW (ref 3.87–5.11)
RDW: 21.2 % — ABNORMAL HIGH (ref 11.5–15.5)
WBC: 8 10*3/uL (ref 4.0–10.5)
nRBC: 0 % (ref 0.0–0.2)

## 2023-08-03 LAB — BASIC METABOLIC PANEL
Anion gap: 9 (ref 5–15)
BUN: 8 mg/dL (ref 8–23)
CO2: 23 mmol/L (ref 22–32)
Calcium: 8 mg/dL — ABNORMAL LOW (ref 8.9–10.3)
Chloride: 101 mmol/L (ref 98–111)
Creatinine, Ser: 0.43 mg/dL — ABNORMAL LOW (ref 0.44–1.00)
GFR, Estimated: >60 mL/min (ref >60)
Glucose, Bld: 96 mg/dL (ref 70–99)
Potassium: 3 mmol/L — ABNORMAL LOW (ref 3.5–5.1)
Sodium: 133 mmol/L — ABNORMAL LOW (ref 135–145)

## 2023-08-03 MED ORDER — ENOXAPARIN SODIUM 40 MG/0.4ML IJ SOSY
40.0000 mg | PREFILLED_SYRINGE | INTRAMUSCULAR | Status: DC
  Administered 2023-08-04 – 2023-08-07 (×4): 40 mg via SUBCUTANEOUS
  Filled 2023-08-03 (×4): qty 0.4

## 2023-08-03 MED ORDER — HYDROCODONE BIT-HOMATROP MBR 5-1.5 MG/5ML PO SOLN
5.0000 mL | Freq: Once | ORAL | Status: AC
  Administered 2023-08-03: 5 mL via ORAL
  Filled 2023-08-03: qty 5

## 2023-08-03 MED ORDER — FUROSEMIDE 10 MG/ML IJ SOLN
80.0000 mg | Freq: Every day | INTRAMUSCULAR | Status: DC
  Administered 2023-08-04 – 2023-08-06 (×3): 80 mg via INTRAVENOUS
  Filled 2023-08-03 (×3): qty 8

## 2023-08-03 MED ORDER — METRONIDAZOLE 500 MG/100ML IV SOLN
500.0000 mg | Freq: Two times a day (BID) | INTRAVENOUS | Status: AC
  Administered 2023-08-03 – 2023-08-06 (×6): 500 mg via INTRAVENOUS
  Filled 2023-08-03 (×5): qty 100

## 2023-08-03 MED ORDER — POTASSIUM CHLORIDE CRYS ER 20 MEQ PO TBCR
40.0000 meq | EXTENDED_RELEASE_TABLET | Freq: Two times a day (BID) | ORAL | Status: AC
  Administered 2023-08-03 (×2): 40 meq via ORAL
  Filled 2023-08-03 (×2): qty 2

## 2023-08-03 MED ORDER — ENSURE ENLIVE PO LIQD
237.0000 mL | Freq: Three times a day (TID) | ORAL | Status: DC
  Administered 2023-08-04 – 2023-08-07 (×8): 237 mL via ORAL

## 2023-08-03 MED ORDER — ALBUMIN HUMAN 25 % IV SOLN
25.0000 g | Freq: Every day | INTRAVENOUS | Status: AC
  Administered 2023-08-03: 25 g via INTRAVENOUS
  Filled 2023-08-03: qty 100

## 2023-08-03 MED ORDER — ADULT MULTIVITAMIN W/MINERALS CH
1.0000 | ORAL_TABLET | Freq: Every day | ORAL | Status: DC
  Administered 2023-08-03 – 2023-08-07 (×5): 1 via ORAL
  Filled 2023-08-03 (×5): qty 1

## 2023-08-03 MED ORDER — VANCOMYCIN HCL 125 MG PO CAPS
125.0000 mg | ORAL_CAPSULE | Freq: Every day | ORAL | Status: DC
  Administered 2023-08-03 – 2023-08-07 (×5): 125 mg via ORAL
  Filled 2023-08-03 (×6): qty 1

## 2023-08-03 MED ORDER — HYDROCODONE BIT-HOMATROP MBR 5-1.5 MG/5ML PO SOLN
5.0000 mL | Freq: Four times a day (QID) | ORAL | Status: DC | PRN
  Administered 2023-08-05: 5 mL via ORAL
  Filled 2023-08-03 (×2): qty 5

## 2023-08-03 NOTE — Progress Notes (Signed)
[facility] PROGRESS NOTE  Miranda Perkins (DOB: Apr 01, 1961) NFA:[id number] PCP: [name], DO Outpatient Specialists: Oncology, Dr. Ellin Saba  Brief Narrative: Miranda Perkins is a 62 y.o. 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 She was admitted for severe sepsis in the setting of left lower lobe pneumonia.  She was started on ceftriaxone, azithromycin and given IV fluid.   Subjective: Still getting up though she tires very quickly, short of breath with minimal exertion, though still not actually hypoxemic. No chest pain. Her anasarca has not changed much though she reports significant subjective UOP.   Objective: BP 119/65   Pulse (!) 117   Temp 98.6 F (37 C) (Oral)   Resp (!) 21   Ht 5\' 6"  (1.676 m)   Wt 103.5 kg   SpO2 95%   BMI 36.83 kg/m   Gen: No distress Pulm: Diminished L > R base, nonlabored but tachypneic  CV: Regular tachycardia without MRG, 1 to 2+ pitting LE edema GI: Soft, NT, ND, +BS Neuro: Alert and oriented. No new focal deficits. Ext: Warm, no deformities. Skin: No rashes, lesions or ulcers on visualized skin   Assessment & Plan: Left lower lobe consolidation: Unclear if this is pneumonia or postobstructive process from underlying left lung cancer. Mucous plugging is a possibility. PCT 2.42.  - Continue ceftriaxone, flagyl.  - Start hypertonic neb, mucinex, flutter valve. Consider CT. Consider chest PT, not sure pt would tolerate at this time.  - Still no wheezing, so hoping to avoid steroid with significant edema.  - Continue nebs.     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  - Started chemotherapy on 9/11.  Unclear if this is curative or palliative based on last oncology note although it seems to be palliative per H&P. Dr. Kirtland Bouchard added to Tx team. Would be due for chemotherapy 10/2, though will defer this for now.   Goal of care counseling: Remains full code.    Anemia of chronic disease: Drop in Hgb likely dilutional from IV fluid, has stabilized.  Patient denies melena or hematochezia.  Occasional small blood tinge sputum when she coughs.  She denies frank hemoptysis. - Repeat CBC today.    Peripheral edema/anasarca: Suspect related to hypoalbuminemia and worsened with IV fluids initially - Albumin + lasix, no charted UOP and weight up considerably today, still grossly volume up with stable Cr. Will increase lasix to 80mg  IV, monitor I/O, renal parameters.  - Echo from 9/5 reviewed, no significant CHF.   Sinus tachycardia: Multifactorial including anemia, deconditioning.  Recent TSH normal.  Recent echo without significant finding. - Tx infection as above, optimize volume status, monitor anemia.  -Continue home metoprolol, increase to 50mg  -Changed DuoNeb to Xopenex with Atrovent -Optimize electrolytes   Hypotension: Mild, resolved.  - Continue home midodrine 5mg  BID.   Hypokalemia/hypomagnesemia: Continues in light of loop diuretic.  - Continue supplementation today.   Mild hyponatremia: likely hypervolemic. - Overall improved, continue IV Lasix as above   Physical deconditioning: Lives with husband.  Uses rolling walker at baseline. -PT/OT evaluated, will arrange home health therapies. Needs to get OOB often to maintain strength  Decreased oral intake: Likely from underlying cancer chemotherapy. -Consult dietitian  History of C. diff: - Give empiric vancomycin po prophylactic dose while on broad abx, guidelines recommend continuing 5 days after abx completed.   Class II obesity Body mass index is 36.86 kg/m.  Stage 2  pressure injury of left buttocks, POA: Offload, optimize nutrition.   Tyrone Nine, MD [facility] [email or web address] 08/03/2023, 8:17 AM

## 2023-08-03 NOTE — Plan of Care (Signed)

## 2023-08-03 NOTE — Progress Notes (Signed)
Initial Nutrition Assessment  DOCUMENTATION CODES:   Non-severe (moderate) malnutrition in context of chronic illness  INTERVENTION:   Ensure Enlive po TID, each supplement provides 350 kcal and 20 grams of protein. MVI with minerals daily. Magic cup TID with meals, each supplement provides 290 kcal and 9 grams of protein.  NUTRITION DIAGNOSIS:   Moderate Malnutrition related to acute illness (lung cancer with adrenal mets) as evidenced by mild muscle depletion, moderate muscle depletion, mild fat depletion, moderate fat depletion, percent weight loss.  GOAL:   Patient will meet greater than or equal to 90% of their needs  MONITOR:   PO intake, Supplement acceptance, Skin  REASON FOR ASSESSMENT:   Consult Assessment of nutrition requirement/status  ASSESSMENT:   62 yo female admitted with severe sepsis, LLL PNA. PMH includes CVA, osteopenia, lung cancer with adrenal mets, S/P radiation, currently receiving chemo, C diff colitis.  Patient reports weight loss from 258 lbs 4-5 months ago to 228 lbs currently. She has had multiple hospital admissions for PNA, LE swelling, ileus, C diff colitis, and intractable nausea and vomiting over the past few months. She started chemo for lung cancer a few weeks ago and has been unable to eat or drink much d/t nausea. She cannot tolerate meats or bread at this time. Husband reports that he has purchased Pure Protein shakes at home (140 kcal and 30 gm protein each). He encourages her to eat small amounts throughout the day, including high protein supplements and yogurt.   Labs reviewed. Na 133, K 3, mag 1.5  Medications reviewed and include Lasix, KCl, IV Rocephin, IV Flagyl.   Patient with 12% weight loss within 6 months and mild-moderate muscle and subcutaneous fat depletion. She meets criteria for moderate malnutrition.  NUTRITION - FOCUSED PHYSICAL EXAM:  Flowsheet Row Most Recent Value  Orbital Region Mild depletion  Upper Arm Region  No depletion  Thoracic and Lumbar Region No depletion  Buccal Region Moderate depletion  Temple Region Moderate depletion  Clavicle Bone Region No depletion  Clavicle and Acromion Bone Region Mild depletion  Scapular Bone Region No depletion  Dorsal Hand No depletion  Patellar Region No depletion  Anterior Thigh Region No depletion  Posterior Calf Region No depletion  Edema (RD Assessment) Moderate  Hair Reviewed  Eyes Reviewed  Mouth Reviewed  Skin Reviewed  Nails Reviewed       Diet Order:   Diet Order             Diet Heart Room service appropriate? Yes; Fluid consistency: Thin  Diet effective now                   EDUCATION NEEDS:   Education needs have been addressed  Skin:  Skin Assessment: Skin Integrity Issues: Skin Integrity Issues:: Stage II Stage II: L buttocks  Last BM:  10/1 type 6/7  Height:   Ht Readings from Last 1 Encounters:  08/01/23 5\' 6"  (1.676 m)    Weight:   Wt Readings from Last 1 Encounters:  08/03/23 103.5 kg    Ideal Body Weight:  59.1 kg  BMI:  Body mass index is 36.83 kg/m.  Estimated Nutritional Needs:   Kcal:  1800-2000  Protein:  90-110 gm  Fluid:  1.8-2 L   Gabriel Rainwater RD, LDN, CNSC Please refer to Amion for contact information.

## 2023-08-04 ENCOUNTER — Inpatient Hospital Stay

## 2023-08-04 ENCOUNTER — Inpatient Hospital Stay: Admitting: Hematology

## 2023-08-04 ENCOUNTER — Ambulatory Visit

## 2023-08-04 DIAGNOSIS — J181 Lobar pneumonia, unspecified organism: Secondary | ICD-10-CM | POA: Diagnosis not present

## 2023-08-04 LAB — CBC
HCT: 26.6 % — ABNORMAL LOW (ref 36.0–46.0)
Hemoglobin: 7.7 g/dL — ABNORMAL LOW (ref 12.0–15.0)
MCH: 24.4 pg — ABNORMAL LOW (ref 26.0–34.0)
MCHC: 28.9 g/dL — ABNORMAL LOW (ref 30.0–36.0)
MCV: 84.4 fL (ref 80.0–100.0)
Platelets: 532 10*3/uL — ABNORMAL HIGH (ref 150–400)
RBC: 3.15 MIL/uL — ABNORMAL LOW (ref 3.87–5.11)
RDW: 21.4 % — ABNORMAL HIGH (ref 11.5–15.5)
WBC: 8.8 10*3/uL (ref 4.0–10.5)
nRBC: 0 % (ref 0.0–0.2)

## 2023-08-04 LAB — IRON AND TIBC
Iron: 16 ug/dL — ABNORMAL LOW (ref 28–170)
Saturation Ratios: 15 % (ref 10.4–31.8)
TIBC: 108 ug/dL — ABNORMAL LOW (ref 250–450)
UIBC: 92 ug/dL

## 2023-08-04 LAB — BASIC METABOLIC PANEL
Anion gap: 8 (ref 5–15)
BUN: 6 mg/dL — ABNORMAL LOW (ref 8–23)
CO2: 25 mmol/L (ref 22–32)
Calcium: 7.9 mg/dL — ABNORMAL LOW (ref 8.9–10.3)
Chloride: 101 mmol/L (ref 98–111)
Creatinine, Ser: 0.41 mg/dL — ABNORMAL LOW (ref 0.44–1.00)
GFR, Estimated: >60 mL/min (ref >60)
Glucose, Bld: 94 mg/dL (ref 70–99)
Potassium: 3.4 mmol/L — ABNORMAL LOW (ref 3.5–5.1)
Sodium: 134 mmol/L — ABNORMAL LOW (ref 135–145)

## 2023-08-04 LAB — VITAMIN D 25 HYDROXY (VIT D DEFICIENCY, FRACTURES): Vit D, 25-Hydroxy: 26.52 ng/mL — ABNORMAL LOW (ref 30–100)

## 2023-08-04 LAB — FOLATE: Folate: 8.2 ng/mL (ref >5.9)

## 2023-08-04 LAB — VITAMIN B12: Vitamin B-12: 896 pg/mL (ref 180–914)

## 2023-08-04 MED ORDER — GUAIFENESIN ER 600 MG PO TB12
600.0000 mg | ORAL_TABLET | Freq: Two times a day (BID) | ORAL | Status: DC
  Administered 2023-08-04 – 2023-08-07 (×6): 600 mg via ORAL
  Filled 2023-08-04 (×6): qty 1

## 2023-08-04 MED ORDER — POLYSACCHARIDE IRON COMPLEX 150 MG PO CAPS
150.0000 mg | ORAL_CAPSULE | Freq: Every day | ORAL | Status: DC
  Administered 2023-08-04 – 2023-08-07 (×4): 150 mg via ORAL
  Filled 2023-08-04 (×4): qty 1

## 2023-08-04 NOTE — Progress Notes (Signed)
[facility] Progress Note  Patient: Miranda Perkins    ZOX:[id number]  DOA: 07/31/2023     Date of Service: the patient was seen and examined on 08/04/2023  Chief Complaint  Patient presents with   Shortness of Breath   Brief hospital course: Miranda Perkins is a 62 y.o. 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 She was admitted for severe sepsis in the setting of left lower lobe pneumonia.  She was started on ceftriaxone, azithromycin and given IV fluid.    Assessment and Plan: Left lower lobe consolidation: Unclear if this is pneumonia or postobstructive process from underlying left lung cancer. Mucous plugging is a possibility. PCT 2.42.  - Continue ceftriaxone, flagyl.  - Start hypertonic neb, mucinex, flutter valve. Consider CT. Consider chest PT, not sure pt would tolerate at this time.  - Still no wheezing, so hoping to avoid steroid with significant edema.  - Continue nebs.     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  - Started chemotherapy on 9/11.  Unclear if this is curative or palliative based on last oncology note although it seems to be palliative per H&P. Dr. Kirtland Bouchard added to Tx team. Would be due for chemotherapy 10/2, though will defer this for now.   Goal of care counseling: Remains full code.    Anemia of chronic disease: Drop in Hgb likely dilutional from IV fluid, has stabilized.  Patient denies melena or hematochezia.  Occasional small blood tinge sputum when she coughs.  She denies frank hemoptysis. Mild iron deficiency, started oral supplement.  Folate and B12 within normal  range. Hb around 8, 7.7 today stable - Repeat CBC daily  Peripheral edema/anasarca: Suspect related to hypoalbuminemia and worsened with IV fluids initially - Albumin + lasix, no charted UOP and weight up considerably today, still grossly volume up with stable Cr. Will increase lasix to 80mg  IV, monitor I/O, renal parameters.  - Echo from 9/5 reviewed, no significant CHF.  10/2 Follow-up venous duplex  Sinus tachycardia: Multifactorial including anemia, deconditioning.  Recent TSH normal.  Recent echo without significant finding. - Tx infection as above, optimize volume status, monitor anemia.  -Continue home metoprolol, increase to 50mg  -Changed DuoNeb to Xopenex with Atrovent -Optimize electrolytes   Hypotension: Mild, resolved.  - Continue home midodrine 5mg  BID.   Hypokalemia/hypomagnesemia: Continues in light of loop diuretic.  - Continue supplementation today.   Mild hyponatremia: likely hypervolemic. - Overall improved, continue IV Lasix as above   Physical deconditioning: Lives with husband.  Uses rolling walker at baseline. -PT/OT evaluated, will arrange home health therapies. Needs to get OOB often to maintain strength   Decreased oral intake: Likely from underlying cancer chemotherapy. -Consult dietitian   History of C. diff: - Give empiric vancomycin po prophylactic dose while on broad abx, guidelines recommend continuing 5 days after abx completed.   Class II obesity Body mass index is 36.86 kg/m.   Stage 2 pressure injury of left buttocks, POA: Offload, optimize nutrition.    Body mass index is 36.44 kg/m.  Nutrition Problem: Moderate Malnutrition Etiology: acute illness (lung cancer with adrenal mets) Interventions: Interventions: Ensure Enlive (each supplement  provides 350kcal and 20 grams of protein), MVI, Magic cup  Pressure Injury 08/01/23 Buttocks Left Stage 2 -  Partial thickness loss of dermis presenting as a shallow open injury with a red, pink  wound bed without slough. opening in the skin supposedly looks like Moisture Associated skin tear (Active)  08/01/23 0131  Location: Buttocks  Location Orientation: Left  Staging: Stage 2 -  Partial thickness loss of dermis presenting as a shallow open injury with a red, pink wound bed without slough.  Wound Description (Comments): opening in the skin supposedly looks like Moisture Associated skin tear  Present on Admission:   Dressing Type None 08/03/23 1319     Diet: Heart healthy diet DVT Prophylaxis: Subcutaneous Lovenox   Advance goals of care discussion: Full code  Family Communication: family was present at bedside, at the time of interview.  The pt provided permission to discuss medical plan with the family. Opportunity was given to ask question and all questions were answered satisfactorily.   Disposition:  Pt is from home, admitted with pneumonia and volume overload, still has pneumonia and lower extremity edema on IV Lasix, which precludes a safe discharge. Discharge to home, when stable, may need few more days to stay in the hospital.  Subjective: No significant events overnight, patient still having productive cough with thick phlegm but is clear now.  Shortness of breathing is getting better, denied any chest pain or palpitation.  Still has significant edema of bilateral lower extremities.  No any other complaints.  Physical Exam: General: NAD, lying comfortably Appear in no distress, affect appropriate Eyes: PERRLA ENT: Oral Mucosa Clear, moist  Neck: no JVD,  Cardiovascular: S1 and S2 Present, no Murmur,  Respiratory: Equal air entry bilaterally, mild crackles bilaterally, no significant wheezing appreciated Abdomen: Bowel Sound present, Soft and no tenderness,  Skin: no rashes Extremities: 3-4+ lower extremity edema right > left, no calf tenderness Neurologic: without any new focal findings Gait not checked due to patient safety concerns  Vitals:   08/04/23  0801 08/04/23 1116 08/04/23 1347 08/04/23 1609  BP:  (!) 105/53  (!) 104/55  Pulse:  (!) 110  82  Resp:  20    Temp:  98.3 F (36.8 C)    TempSrc:      SpO2: 91% 95% 91% 92%  Weight:      Height:        Intake/Output Summary (Last 24 hours) at 08/04/2023 1628 Last data filed at 08/04/2023 1549 Gross per 24 hour  Intake 1120.92 ml  Output --  Net 1120.92 ml   Filed Weights   08/02/23 1143 08/03/23 0417 08/04/23 0300  Weight: 101.5 kg 103.5 kg 102.4 kg    Data Reviewed: I have personally reviewed and interpreted daily labs, tele strips, imagings as discussed above. I reviewed all nursing notes, pharmacy notes, vitals, pertinent old records I have discussed plan of care as described above with RN and patient/family.  CBC: Recent Labs  Lab 07/31/23 1529 08/01/23 0626 08/02/23 1535 08/03/23 0459 08/04/23 0413  WBC 7.0 5.9 7.4 8.0 8.8  NEUTROABS 4.6  --   --   --   --   HGB 9.4* 7.9* 7.8* 8.1* 7.7*  HCT 30.9* 26.3* 25.9* 27.5* 26.6*  MCV 81.3 82.2 83.5 82.8 84.4  PLT 471* 448* 555* 550* 532*   Basic Metabolic Panel: Recent Labs  Lab 07/31/23 1529 08/01/23 0626 08/02/23 1535 08/03/23 0459 08/04/23 0413  NA 132* 132* 135 133* 134*  K 2.8* 2.9*  3.4* 3.0* 3.4*  CL 97* 100 104 101 101  CO2 24 22 23 23 25   GLUCOSE 105* 85 161* 96 94  BUN 9 8 10 8  6*  CREATININE 0.49 0.44 0.44 0.43* 0.41*  CALCIUM 8.2* 7.7* 8.0* 8.0* 7.9*  MG  --  1.5*  --   --   --     Studies: No results found.  Scheduled Meds:  Chlorhexidine Gluconate Cloth  6 each Topical Q0600   dextromethorphan-guaiFENesin  1 tablet Oral BID   enoxaparin (LOVENOX) injection  40 mg Subcutaneous Q24H   feeding supplement  237 mL Oral TID BM   furosemide  80 mg Intravenous Daily   levalbuterol  0.63 mg Nebulization TID   And   ipratropium  0.5 mg Nebulization TID   metoprolol tartrate  50 mg Oral BID   midodrine  5 mg Oral BID WC   multivitamin with minerals  1 tablet Oral Daily   vancomycin  125 mg  Oral Daily   Continuous Infusions:  cefTRIAXone (ROCEPHIN)  IV 2 g (08/03/23 2108)   metronidazole Stopped (08/04/23 1416)   PRN Meds: acetaminophen **OR** acetaminophen, HYDROcodone bit-homatropine, levalbuterol, ondansetron **OR** ondansetron (ZOFRAN) IV  Time spent: 35 minutes  Author: Gillis Santa. MD [facility] 08/04/2023 4:28 PM  To reach On-call, see care teams to locate the attending and reach out to them via [email or web address]. If 7PM-7AM, please contact night-coverage If you still have difficulty reaching the attending provider, please page the [facility] (Director on Call) for [facility] on amion for assistance.

## 2023-08-04 NOTE — Progress Notes (Signed)
Patient has been stable this shift.  Patient vitals have been stable, patient has voided with assistance from staff several times this shift. Vitals have been stable, although heart rate has been elevated.  Patient has had no complaints of sob, and has had no need of oxygen.

## 2023-08-04 NOTE — Plan of Care (Signed)
  Problem: Acute Rehab OT Goals (only OT should resolve) Goal: Pt. Will Perform Upper Body Bathing Flowsheets (Taken 08/04/2023 0941) Pt Will Perform Upper Body Bathing:  with modified independence  sitting Goal: Pt. Will Perform Upper Body Dressing Flowsheets (Taken 08/04/2023 0941) Pt Will Perform Upper Body Dressing:  with modified independence  sitting Goal: Pt. Will Transfer To Toilet Flowsheets (Taken 08/04/2023 0941) Pt Will Transfer to Toilet:  with contact guard assist  stand pivot transfer Goal: Pt. Will Perform Toileting-Clothing Manipulation Flowsheets (Taken 08/04/2023 0941) Pt Will Perform Toileting - Clothing Manipulation and hygiene:  with contact guard assist  sitting/lateral leans Goal: Pt/Caregiver Will Perform Home Exercise Program Flowsheets (Taken 08/04/2023 [phone]) Pt/caregiver will Perform Home Exercise Program:  Increased ROM  Increased strength  Both right and left upper extremity  Independently  Kali Deadwyler OT, MOT

## 2023-08-04 NOTE — Evaluation (Signed)
Occupational Therapy Evaluation Patient Details Name: Miranda Perkins MRN: [id number] DOB: [birth year] Today's Date: 08/04/2023   History of Present Illness Miranda Perkins is a 61 y.o. 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    Recent hospitalization 09/3 to 07/11/23 for post obstructive pneumonia complicated with severe sepsis.     Patient has been getting palliative chemotherapy, started on 07/14/23.      Over last 3 to 4 days, patient has been experiencing productive cough with thick sputum, worsening dyspnea and pleuritic chest pain. Generalized malaise and for the last 2 nights fever.  She also noted worsening lower extremity edema, apparently she has not been taking prescribed furosemide.   09/25 Oncology nurse was contacted by patients daughter, regarding worsening lower extremity edema, dyspnea, fever and hypoxia (02 saturation 88% on room air). She was advised to take patient to the ED. At home she has been using albuterol with no improvement in her symptoms.   Clinical Impression   Pt agreeable to OT evaluation. Pt requested assist to the [facility] at the start of the session. Mod A needed for initial sit to stand. Min to mod for steps to [facility]. Pt required max A for toileting with assist to stand and assist for peri-care. Pt is assisted for bathing and dressing at baseline. Reports husband is physically able to take care of her at home. Pt is generally weak with noted labored breathing while standing. Pt will benefit from continued OT in the hospital and recommended venue below to increase strength, balance, and endurance for safe ADL's.          If plan is discharge home, recommend the following: A lot of help with walking and/or transfers;A lot of help with bathing/dressing/bathroom;Assistance with cooking/housework;Assist for transportation;Help  with stairs or ramp for entrance    Functional Status Assessment  Patient has had a recent decline in their functional status and demonstrates the ability to make significant improvements in function in a reasonable and predictable amount of time.  Equipment Recommendations  None recommended by OT    Recommendations for Other Services       Precautions / Restrictions Precautions Precautions: Fall Restrictions Weight Bearing Restrictions: No      Mobility Bed Mobility Overal bed mobility: Needs Assistance Bed Mobility: Supine to Sit     Supine to sit: Mod assist     General bed mobility comments: Assist to mobilize B LE    Transfers Overall transfer level: Needs assistance Equipment used: Rolling walker (2 wheels) Transfers: Sit to/from Stand, Bed to chair/wheelchair/BSC Sit to Stand: Mod assist, Min assist     Step pivot transfers: Min assist     General transfer comment: Mod A to boost from EOB for initial sit to stand. Improvement noted with additional reps from toilet and to scoot up in bed. Labored movement with heavy breathing for steps to bed and BSC.      Balance Overall balance assessment: Needs assistance Sitting-balance support: No upper extremity supported, Feet supported Sitting balance-Leahy Scale: Fair Sitting balance - Comments: fair/good seated at EOB   Standing balance support: Reliant on assistive device for balance, During functional activity, Bilateral upper extremity supported Standing balance-Leahy Scale: Fair Standing balance comment: using RW  ADL either performed or assessed with clinical judgement   ADL Overall ADL's : Needs assistance/impaired         Upper Body Bathing: Minimal assistance;Sitting   Lower Body Bathing: Maximal assistance;Sitting/lateral leans   Upper Body Dressing : Set up;Minimal assistance;Sitting   Lower Body Dressing: Maximal assistance;Sitting/lateral leans    Toilet Transfer: Moderate assistance;Minimal assistance;Rolling walker (2 wheels);Stand-pivot;BSC/3in1 Statistician Details (indicate cue type and reason): EOB to [facility] and back. Toileting- Clothing Manipulation and Hygiene: Maximal assistance;Sit to/from stand Toileting - Clothing Manipulation Details (indicate cue type and reason): NT completed peri-care for pt while this therapist helped the  pt stand using RW.             Vision Baseline Vision/History: 1 Wears glasses Ability to See in Adequate Light: 1 Impaired Patient Visual Report: Other (comment) (Pt reports need to wear her glasses all the time over the last several weeks. Typically did not have to wear them all the time.) Vision Assessment?: No apparent visual deficits Additional Comments: Other than reported issues.     Perception Perception: Not tested       Praxis Praxis: Not tested       Pertinent Vitals/Pain Pain Assessment Pain Assessment: Faces Faces Pain Scale: No hurt     Extremity/Trunk Assessment Upper Extremity Assessment Upper Extremity Assessment: Generalized weakness (limited to ~75% of available shoulder flexion range. Pt reports this is baseline.)   Lower Extremity Assessment Lower Extremity Assessment: Defer to PT evaluation   Cervical / Trunk Assessment Cervical / Trunk Assessment: Normal   Communication Communication Communication: No apparent difficulties   Cognition Arousal: Alert Behavior During Therapy: Anxious, WFL for tasks assessed/performed Overall Cognitive Status: Within Functional Limits for tasks assessed                                                        Home Living Family/patient expects to be discharged to:: Private residence Living Arrangements: Spouse/significant other Available Help at Discharge: Family;Available 24 hours/day Type of Home: House Home Access: Stairs to enter [employer] of Steps: 5 Entrance Stairs-Rails:  Can reach both Home Layout: One level     Bathroom Shower/Tub: Tub/shower unit;Walk-in shower   Bathroom Toilet: Standard Bathroom Accessibility: Yes   Home Equipment: Educational psychologist (2 wheels);BSC/3in1;Shower seat - built in          Prior Functioning/Environment Prior Level of Function : Needs assist       Physical Assist : ADLs (physical);Mobility (physical) Mobility (physical): Bed mobility;Transfers;Gait;Stairs ADLs (physical): Bathing;Dressing;IADLs Mobility Comments: household ambulation using RW ADLs Comments: Assisted for bathing, dressing, toilet transfers, and IADL's.        OT Problem List: Decreased strength;Decreased range of motion;Decreased activity tolerance;Impaired balance (sitting and/or standing);Cardiopulmonary status limiting activity;Obesity;Increased edema      OT Treatment/Interventions: Self-care/ADL training;Therapeutic exercise;Energy conservation;Patient/family education;Therapeutic activities    OT Goals(Current goals can be found in the care plan section) Acute Rehab OT Goals Patient Stated Goal: return home OT Goal Formulation: With patient Time For Goal Achievement: 08/18/23 Potential to Achieve Goals: Good  OT Frequency: Min 2X/week                                   End of Session Equipment Utilized During  Treatment: Rolling walker (2 wheels) Nurse Communication: Other (comment) (NT assisted with toileting)  Activity Tolerance: Patient tolerated treatment well Patient left: in bed;with call bell/phone within reach  OT Visit Diagnosis: Unsteadiness on feet (R26.81);Other abnormalities of gait and mobility (R26.89);Muscle weakness (generalized) (M62.81)                Time: 1610-9604 OT Time Calculation (min): 20 min Charges:  OT General Charges $OT Visit: 1 Visit OT Evaluation $OT Eval Low Complexity: 1 Low  Webb Weed OT, MOT   Danie Chandler 08/04/2023, 9:39 AM

## 2023-08-04 NOTE — Progress Notes (Signed)
PT Cancellation Note  Patient Details Name: KRYSTLE POLCYN MRN: [id number] DOB: Dec 16, 1960   Cancelled Treatment:    Reason Eval/Treat Not Completed: Other (comment) (Pt requesting tomorrow as she is getting ready to fall back sleep.)  Pt hesitant for mobility today. Pt reporting she will be ready tomorrow. Pt educated on benefits of mobility and continuing to request for tomorrow.  Nelida Meuse 08/04/2023, 3:55 PM

## 2023-08-04 NOTE — Progress Notes (Signed)
   08/04/23 0747  Assess: MEWS Score  Temp 98.2 F (36.8 C)  BP 102/64  MAP (mmHg) 76  Pulse Rate (!) 118  SpO2 97 %  O2 Device Room Air  Assess: MEWS Score  MEWS Temp 0  MEWS Systolic 0  MEWS Pulse 2  MEWS RR 1  MEWS LOC 0  MEWS Score 3  MEWS Score Color Yellow  Assess: if the MEWS score is Yellow or Red  Were vital signs accurate and taken at a resting state? Yes  Does the patient meet 2 or more of the SIRS criteria? No  Does the patient have a confirmed or suspected source of infection? Yes  MEWS guidelines implemented  No, previously yellow, continue vital signs every 4 hours  Assess: SIRS CRITERIA  SIRS Temperature  0  SIRS Pulse 1  SIRS Respirations  1  SIRS WBC 0  SIRS Score Sum  2

## 2023-08-05 ENCOUNTER — Inpatient Hospital Stay (HOSPITAL_COMMUNITY)

## 2023-08-05 ENCOUNTER — Inpatient Hospital Stay: Admitting: Dietician

## 2023-08-05 DIAGNOSIS — J181 Lobar pneumonia, unspecified organism: Secondary | ICD-10-CM | POA: Diagnosis not present

## 2023-08-05 LAB — CULTURE, BLOOD (ROUTINE X 2)
Special Requests: ADEQUATE
Special Requests: ADEQUATE

## 2023-08-05 LAB — BASIC METABOLIC PANEL
Anion gap: 8 (ref 5–15)
BUN: 8 mg/dL (ref 8–23)
CO2: 26 mmol/L (ref 22–32)
Calcium: 7.6 mg/dL — ABNORMAL LOW (ref 8.9–10.3)
Chloride: 98 mmol/L (ref 98–111)
Creatinine, Ser: 0.47 mg/dL (ref 0.44–1.00)
GFR, Estimated: >60 mL/min (ref >60)
Glucose, Bld: 98 mg/dL (ref 70–99)
Potassium: 2.9 mmol/L — ABNORMAL LOW (ref 3.5–5.1)
Sodium: 132 mmol/L — ABNORMAL LOW (ref 135–145)

## 2023-08-05 LAB — CBC
HCT: 26.3 % — ABNORMAL LOW (ref 36.0–46.0)
Hemoglobin: 7.7 g/dL — ABNORMAL LOW (ref 12.0–15.0)
MCH: 24.7 pg — ABNORMAL LOW (ref 26.0–34.0)
MCHC: 29.3 g/dL — ABNORMAL LOW (ref 30.0–36.0)
MCV: 84.3 fL (ref 80.0–100.0)
Platelets: 527 10*3/uL — ABNORMAL HIGH (ref 150–400)
RBC: 3.12 MIL/uL — ABNORMAL LOW (ref 3.87–5.11)
RDW: 21.7 % — ABNORMAL HIGH (ref 11.5–15.5)
WBC: 11.3 10*3/uL — ABNORMAL HIGH (ref 4.0–10.5)
nRBC: 0 % (ref 0.0–0.2)

## 2023-08-05 LAB — MAGNESIUM: Magnesium: 1.4 mg/dL — ABNORMAL LOW (ref 1.7–2.4)

## 2023-08-05 LAB — PHOSPHORUS: Phosphorus: 3 mg/dL (ref 2.5–4.6)

## 2023-08-05 MED ORDER — MIRTAZAPINE 15 MG PO TABS
15.0000 mg | ORAL_TABLET | Freq: Every day | ORAL | Status: DC
  Administered 2023-08-05 – 2023-08-06 (×2): 15 mg via ORAL
  Filled 2023-08-05 (×2): qty 1

## 2023-08-05 MED ORDER — POTASSIUM CHLORIDE CRYS ER 20 MEQ PO TBCR
40.0000 meq | EXTENDED_RELEASE_TABLET | ORAL | Status: AC
  Administered 2023-08-05 (×2): 40 meq via ORAL
  Filled 2023-08-05 (×2): qty 2

## 2023-08-05 MED ORDER — MAGNESIUM SULFATE 4 GM/100ML IV SOLN
4.0000 g | Freq: Once | INTRAVENOUS | Status: AC
  Administered 2023-08-05: 4 g via INTRAVENOUS
  Filled 2023-08-05: qty 100

## 2023-08-05 NOTE — Progress Notes (Signed)
[facility] Progress Note  Patient: Miranda Perkins    UJW:[id number]  DOA: 07/31/2023     Date of Service: the patient was seen and examined on 08/05/2023  Chief Complaint  Patient presents with   Shortness of Breath   Brief hospital course: Miranda Perkins is a 62 y.o. 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 She was admitted for severe sepsis in the setting of left lower lobe pneumonia.  She was started on ceftriaxone, azithromycin and given IV fluid.    Assessment and Plan: 1)Left lower lobe consolidation: Unclear if this is pneumonia or postobstructive process from underlying left lung cancer. Mucous plugging is a possibility. PCT 2.42.  - Continue ceftriaxone, flagyl.  - Start hypertonic neb, mucinex, flutter valve. Consider CT. Consider chest PT, not sure pt would tolerate at this time.  Discussed with  Dr. Ellin Saba -Chemo on 08/04/2023 was skipped due to acute infection -Patient will follow-up with Dr. Ellin Saba in the office on 08/09/2023 -Continue Rocephin and Flagyl at this time   2)Metastatic primary lung cancer, left-with mets to right adrenal gland s/p XRT.  - Started chemotherapy on 07/14/23.   -Please see discussions with Dr. Ellin Saba as above #1  3) insomnia and anorexia--- in the setting of underlying malignancy and chemo with radiation -Give Remeron 15 nightly -Nutritional supplements advised  4) recurrent C. difficile infections--- continue oral vancomycin -Currently on IV Flagyl for #1 above -Avoid over excessive ,prolonged antibiotics given propensity towards C. difficile infections -Patient will need to  continue oral vancomycin for at least 5 days after completing antibiotics per guidelines  Goal of care counseling: Remains full code.    Anemia of chronic disease: Drop in Hgb likely dilutional from IV fluid, has stabilized.  Patient denies melena or hematochezia.  Occasional small blood tinge sputum when she coughs.  She denies frank hemoptysis. Mild iron deficiency, started oral supplement.  Folate and B12 within normal range. Hb around 8, 7.7 today stable -Monitor H&H  Peripheral edema/anasarca: Suspect related to hypoalbuminemia and worsened with IV fluids initially - Albumin + lasix, no charted UOP and weight up considerably today, still grossly volume up with stable Cr. Will increase lasix to 80mg  IV, monitor I/O, renal parameters.  - Echo from 9/5 reviewed, no significant CHF.  10/2 Follow-up venous duplex  Sinus tachycardia: Multifactorial including anemia, deconditioning.  Recent TSH normal.  Recent echo without significant finding. - Tx infection as above, optimize volume status, monitor anemia.  -Continue home metoprolol, increase to 50mg  -Changed DuoNeb to Xopenex with Atrovent -Optimize electrolytes   Hypotension: Mild, resolved.  - Continue home midodrine 5mg  BID.   Hypokalemia/hypomagnesemia: Continues in light of loop diuretic.  - Continue supplementation today.   Mild hyponatremia: likely hypervolemic. - Overall improved, continue IV Lasix as above   Physical deconditioning: Lives with husband.  Uses rolling walker at baseline. -PT/OT evaluated, will arrange home health therapies. Needs to get OOB often to maintain strength   Decreased oral intake: Likely from underlying cancer chemotherapy. -Consult dietitian    Class II obesity Body mass index is 36.86 kg/m.   Stage 2 pressure injury of left buttocks, POA: Offload, optimize nutrition.  Body mass index is 36.29 kg/m.  Nutrition Problem: Moderate Malnutrition Etiology: acute illness (lung cancer with  adrenal mets) Interventions: Interventions: Ensure Enlive (each supplement provides 350kcal and 20 grams of protein), MVI, Magic cup  Pressure Injury 08/01/23 Buttocks Left Stage 2 -  Partial thickness loss of dermis presenting as a shallow open injury with a red, pink wound bed without slough. opening in the skin supposedly looks like Moisture Associated skin tear (Active)  08/01/23 0131  Location: Buttocks  Location Orientation: Left  Staging: Stage 2 -  Partial thickness loss of dermis presenting as a shallow open injury with a red, pink wound bed without slough.  Wound Description (Comments): opening in the skin supposedly looks like Moisture Associated skin tear  Present on Admission:   Dressing Type None 08/03/23 1319     Diet: Heart healthy diet DVT Prophylaxis: Subcutaneous Lovenox   Advance goals of care discussion: Full code  Family Communication: Discussed with husband at bedside  Disposition:  Pt is from home, admitted with pneumonia and volume overload, still has pneumonia and lower extremity edema on IV Lasix, which precludes a safe discharge. Discharge to home with home health services, when stable, may need few more days to stay in the hospital.  Subjective:   -Husband and physical therapist at bedside = -Appetite remains poor No fever  Or chills   No Nausea, Vomiting or Diarrhea      Physical Exam: General: NAD, lying comfortably Appear in no distress, affect appropriate Eyes: PERRLA ENT: Oral Mucosa Clear, moist  Neck: no JVD,  Cardiovascular: S1 and S2 Present, no Murmur,  Respiratory: Equal air entry bilaterally, mild crackles bilaterally, no significant wheezing appreciated Abdomen: Bowel Sound present, Soft and no tenderness,  Skin: no rashes Extremities: 3-4+ lower extremity edema right > left, no calf tenderness Neurologic: without any new focal findings Gait not checked due to patient safety concerns  Vitals:   08/05/23 0731 08/05/23 1338  08/05/23 1404 08/05/23 1647  BP:   (!) 100/56 (!) 97/53  Pulse: (!) 118 (!) 103 (!) 103 (!) 109  Resp: 18 (!) 23    Temp:   99.7 F (37.6 C) 99.4 F (37.4 C)  TempSrc:   Oral Oral  SpO2: 91% 93% 95% 94%  Weight:      Height:        Intake/Output Summary (Last 24 hours) at 08/05/2023 1851 Last data filed at 08/05/2023 1500 Gross per 24 hour  Intake 621.49 ml  Output 3400 ml  Net -2778.51 ml   Filed Weights   08/03/23 0417 08/04/23 0300 08/05/23 0500  Weight: 103.5 kg 102.4 kg 102 kg   CBC: Recent Labs  Lab 07/31/23 1529 08/01/23 0626 08/02/23 1535 08/03/23 0459 08/04/23 0413 08/05/23 0403  WBC 7.0 5.9 7.4 8.0 8.8 11.3*  NEUTROABS 4.6  --   --   --   --   --   HGB 9.4* 7.9* 7.8* 8.1* 7.7* 7.7*  HCT 30.9* 26.3* 25.9* 27.5* 26.6* 26.3*  MCV 81.3 82.2 83.5 82.8 84.4 84.3  PLT 471* 448* 555* 550* 532* 527*   Basic Metabolic Panel: Recent Labs  Lab 08/01/23 0626 08/02/23 1535 08/03/23 0459 08/04/23 0413 08/05/23 0403  NA 132* 135 133* 134* 132*  K 2.9* 3.4* 3.0* 3.4* 2.9*  CL 100 104 101 101 98  CO2 22 23 23 25 26   GLUCOSE 85 161* 96 94 98  BUN 8 10 8  6* 8  CREATININE 0.44 0.44 0.43* 0.41* 0.47  CALCIUM  7.7* 8.0* 8.0* 7.9* 7.6*  MG 1.5*  --   --   --  1.4*  PHOS  --   --   --   --  3.0    Studies: US Venous Img Lower Bilateral (DVT)  Result Date: 08/05/2023 CLINICAL DATA:  Bilateral lower extremity edema EXAM: BILATERAL LOWER EXTREMITY VENOUS DOPPLER ULTRASOUND TECHNIQUE: Gray-scale sonography with graded compression, as well as color Doppler and duplex ultrasound were performed to evaluate the lower extremity deep venous systems from the level of the common femoral vein and including the common femoral, femoral, profunda femoral, popliteal and calf veins including the posterior tibial, peroneal and gastrocnemius veins when visible. The superficial great saphenous vein was also interrogated. Spectral Doppler was utilized to evaluate flow at rest and with distal  augmentation maneuvers in the common femoral, femoral and popliteal veins. COMPARISON:  None Available. FINDINGS: RIGHT LOWER EXTREMITY Common Femoral Vein: No evidence of thrombus. Normal compressibility, respiratory phasicity and response to augmentation. Saphenofemoral Junction: No evidence of thrombus. Normal compressibility and flow on color Doppler imaging. Profunda Femoral Vein: No evidence of thrombus. Normal compressibility and flow on color Doppler imaging. Femoral Vein: No evidence of thrombus. Normal compressibility, respiratory phasicity and response to augmentation. Popliteal Vein: No evidence of thrombus. Normal compressibility, respiratory phasicity and response to augmentation. Calf Veins: No evidence of thrombus. Normal compressibility and flow on color Doppler imaging. Superficial Great Saphenous Vein: No evidence of thrombus. Normal compressibility. Venous Reflux:  None. Other Findings:  None. LEFT LOWER EXTREMITY Common Femoral Vein: No evidence of thrombus. Normal compressibility, respiratory phasicity and response to augmentation. Saphenofemoral Junction: No evidence of thrombus. Normal compressibility and flow on color Doppler imaging. Profunda Femoral Vein: No evidence of thrombus. Normal compressibility and flow on color Doppler imaging. Femoral Vein: No evidence of thrombus. Normal compressibility, respiratory phasicity and response to augmentation. Popliteal Vein: No evidence of thrombus. Normal compressibility, respiratory phasicity and response to augmentation. Calf Veins: No evidence of thrombus. Normal compressibility and flow on color Doppler imaging. Superficial Great Saphenous Vein: No evidence of thrombus. Normal compressibility. Venous Reflux:  None. Other Findings:  None. IMPRESSION: No evidence of deep venous thrombosis in either lower extremity. Electronically Signed   By: Malachy Moan M.D.   On: 08/05/2023 10:10    Scheduled Meds:  Chlorhexidine Gluconate Cloth  6 each  Topical Q0600   enoxaparin (LOVENOX) injection  40 mg Subcutaneous Q24H   feeding supplement  237 mL Oral TID BM   furosemide  80 mg Intravenous Daily   guaiFENesin  600 mg Oral BID   levalbuterol  0.63 mg Nebulization TID   And   ipratropium  0.5 mg Nebulization TID   iron polysaccharides  150 mg Oral Daily   metoprolol tartrate  50 mg Oral BID   midodrine  5 mg Oral BID WC   mirtazapine  15 mg Oral QHS   multivitamin with minerals  1 tablet Oral Daily   vancomycin  125 mg Oral Daily   Continuous Infusions:  cefTRIAXone (ROCEPHIN)  IV 2 g (08/04/23 2155)   metronidazole 500 mg (08/05/23 1411)   PRN Meds: acetaminophen **OR** acetaminophen, HYDROcodone bit-homatropine, levalbuterol, ondansetron **OR** ondansetron (ZOFRAN) IV  Author: Shon Hale, MD  [facility] 08/05/2023 6:51 PM  To reach On-call, see care teams to locate the attending and reach out to them via [email or web address]. If 7PM-7AM, please contact night-coverage If you still have difficulty reaching the attending provider, please page the [facility] (Director on Call) for [facility] on amion for assistance.

## 2023-08-05 NOTE — Progress Notes (Signed)
Physical Therapy Treatment Patient Details Name: Miranda Perkins MRN: [id number] DOB: Jul 15, 1961 Today's Date: 08/05/2023   History of Present Illness Miranda Perkins is a 62 y.o. 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    Recent hospitalization 09/3 to 07/11/23 for post obstructive pneumonia complicated with severe sepsis.     Patient has been getting palliative chemotherapy, started on 07/14/23.      Over last 3 to 4 days, patient has been experiencing productive cough with thick sputum, worsening dyspnea and pleuritic chest pain. Generalized malaise and for the last 2 nights fever.  She also noted worsening lower extremity edema, apparently she has not been taking prescribed furosemide.   09/25 Oncology nurse was contacted by patients daughter, regarding worsening lower extremity edema, dyspnea, fever and hypoxia (02 saturation 88% on room air). She was advised to take patient to the ED. At home she has been using albuterol with no improvement in her symptoms.    PT Comments  Patient presents seated in chair (assisted by nursing staff) and agreeable for therapy.  Patient demonstrates fair/good return for completing BLE ROM/strengthening exercises while seated in chair, but required occasional rest breaks due to c/o fatigue, limited to a few steps at bedside before having to sit due to fatigue and requested to go back to bed after therapy due to pain over buttocks.  Patient will benefit from continued skilled physical therapy in hospital and recommended venue below to increase strength, balance, endurance for safe ADLs and gait.      If plan is discharge home, recommend the following: A little help with walking and/or transfers;A little help with bathing/dressing/bathroom;Help with stairs or ramp for entrance;Assistance with cooking/housework   Can travel  by private vehicle        Equipment Recommendations  None recommended by PT    Recommendations for Other Services       Precautions / Restrictions Precautions Precautions: Fall Restrictions Weight Bearing Restrictions: No     Mobility  Bed Mobility Overal bed mobility: Needs Assistance Bed Mobility: Sit to Supine     Supine to sit: Min assist     General bed mobility comments: had difficulty moving legs when getting back to bed    Transfers Overall transfer level: Needs assistance Equipment used: Rolling walker (2 wheels) Transfers: Sit to/from Stand, Bed to chair/wheelchair/BSC Sit to Stand: Mod assist   Step pivot transfers: Min assist       General transfer comment: required repeated attemtps before completing sit to stands due to BLE weakness    Ambulation/Gait Ambulation/Gait assistance: Min assist Gait Distance (Feet): 10 Feet Assistive device: Rolling walker (2 wheels) Gait Pattern/deviations: Decreased step length - right, Decreased step length - left, Decreased stride length Gait velocity: slow     General Gait Details: limited to a few steps at bedside mostly due to c/o fatigue   Stairs             Wheelchair Mobility     Tilt Bed    Modified Rankin (Stroke Patients Only)       Balance Overall balance assessment: Needs assistance Sitting-balance support: Feet supported, No upper extremity supported Sitting balance-Leahy Scale: Fair Sitting balance - Comments: fair/good seated at EOB   Standing balance support: Reliant on assistive device for balance, During functional activity, Bilateral upper extremity supported Standing balance-Leahy Scale: Fair Standing balance comment:  using RW                            Cognition Arousal: Alert Behavior During Therapy: Anxious, WFL for tasks assessed/performed Overall Cognitive Status: Within Functional Limits for tasks assessed                                           Exercises General Exercises - Lower Extremity Long Arc Quad: Seated, AROM, Strengthening, Both, 10 reps Hip Flexion/Marching: Seated, AROM, Strengthening, Both, 10 reps Toe Raises: Seated, AROM, Strengthening, Both, 10 reps Heel Raises: Seated, AROM, Strengthening, Both, 10 reps    General Comments        Pertinent Vitals/Pain Pain Assessment Pain Assessment: Faces Faces Pain Scale: Hurts little more Pain Location: over buttocks while sitting Pain Descriptors / Indicators: Sore, Grimacing Pain Intervention(s): Limited activity within patient's tolerance, Monitored during session, Repositioned    Home Living                          Prior Function            PT Goals (current goals can now be found in the care plan section) Acute Rehab PT Goals Patient Stated Goal: return home with family to assist PT Goal Formulation: With patient/family Time For Goal Achievement: 08/09/23 Potential to Achieve Goals: Good Progress towards PT goals: Progressing toward goals    Frequency           PT Plan      Co-evaluation              AM-PAC PT "6 Clicks" Mobility   Outcome Measure  Help needed turning from your back to your side while in a flat bed without using bedrails?: A Little Help needed moving from lying on your back to sitting on the side of a flat bed without using bedrails?: A Little Help needed moving to and from a bed to a chair (including a wheelchair)?: A Little Help needed standing up from a chair using your arms (e.g., wheelchair or bedside chair)?: A Lot Help needed to walk in hospital room?: A Little Help needed climbing 3-5 steps with a railing? : A Lot 6 Click Score: 16    End of Session   Activity Tolerance: Patient tolerated treatment well;Patient limited by fatigue Patient left: in bed;with call bell/phone within reach;with family/visitor present Nurse Communication: Mobility status PT Visit Diagnosis: Unsteadiness on  feet (R26.81);Other abnormalities of gait and mobility (R26.89);Muscle weakness (generalized) (M62.81)     Time: 1610-9604 PT Time Calculation (min) (ACUTE ONLY): 25 min  Charges:    $Therapeutic Exercise: 8-22 mins $Therapeutic Activity: 8-22 mins PT General Charges $$ ACUTE PT VISIT: 1 Visit                     1:38 PM, 08/05/23 Ocie Bob, MPT Physical Therapist with [facility] [phone] office [phone] mobile phone

## 2023-08-05 NOTE — Progress Notes (Signed)
Patient has had uneventful night.  Patient stated she feels as though lasix is improving her mobility.  Patient stated she was able to move right leg with more ease.  Patient did have bowel movement last night. Vitals have been stable.

## 2023-08-05 NOTE — Plan of Care (Signed)

## 2023-08-06 DIAGNOSIS — J181 Lobar pneumonia, unspecified organism: Secondary | ICD-10-CM | POA: Diagnosis not present

## 2023-08-06 LAB — CBC
HCT: 26.3 % — ABNORMAL LOW (ref 36.0–46.0)
Hemoglobin: 8 g/dL — ABNORMAL LOW (ref 12.0–15.0)
MCH: 25.3 pg — ABNORMAL LOW (ref 26.0–34.0)
MCHC: 30.4 g/dL (ref 30.0–36.0)
MCV: 83.2 fL (ref 80.0–100.0)
Platelets: 566 10*3/uL — ABNORMAL HIGH (ref 150–400)
RBC: 3.16 MIL/uL — ABNORMAL LOW (ref 3.87–5.11)
RDW: 21.9 % — ABNORMAL HIGH (ref 11.5–15.5)
WBC: 12.9 10*3/uL — ABNORMAL HIGH (ref 4.0–10.5)
nRBC: 0 % (ref 0.0–0.2)

## 2023-08-06 LAB — RENAL FUNCTION PANEL
Albumin: 2.1 g/dL — ABNORMAL LOW (ref 3.5–5.0)
Anion gap: 10 (ref 5–15)
BUN: 9 mg/dL (ref 8–23)
CO2: 25 mmol/L (ref 22–32)
Calcium: 7.8 mg/dL — ABNORMAL LOW (ref 8.9–10.3)
Chloride: 99 mmol/L (ref 98–111)
Creatinine, Ser: 0.51 mg/dL (ref 0.44–1.00)
GFR, Estimated: >60 mL/min (ref >60)
Glucose, Bld: 109 mg/dL — ABNORMAL HIGH (ref 70–99)
Phosphorus: 2.9 mg/dL (ref 2.5–4.6)
Potassium: 3.2 mmol/L — ABNORMAL LOW (ref 3.5–5.1)
Sodium: 134 mmol/L — ABNORMAL LOW (ref 135–145)

## 2023-08-06 MED ORDER — POTASSIUM CHLORIDE CRYS ER 20 MEQ PO TBCR
40.0000 meq | EXTENDED_RELEASE_TABLET | ORAL | Status: AC
  Administered 2023-08-06 (×2): 40 meq via ORAL
  Filled 2023-08-06: qty 2

## 2023-08-06 MED ORDER — FUROSEMIDE 10 MG/ML IJ SOLN
40.0000 mg | Freq: Two times a day (BID) | INTRAMUSCULAR | Status: DC
  Administered 2023-08-06 – 2023-08-07 (×2): 40 mg via INTRAVENOUS
  Filled 2023-08-06 (×2): qty 4

## 2023-08-06 MED ORDER — LEVALBUTEROL HCL 0.63 MG/3ML IN NEBU
0.6300 mg | INHALATION_SOLUTION | Freq: Two times a day (BID) | RESPIRATORY_TRACT | Status: DC
  Administered 2023-08-06 – 2023-08-07 (×2): 0.63 mg via RESPIRATORY_TRACT
  Filled 2023-08-06 (×2): qty 3

## 2023-08-06 MED ORDER — METOPROLOL TARTRATE 25 MG PO TABS
25.0000 mg | ORAL_TABLET | Freq: Two times a day (BID) | ORAL | Status: DC
  Administered 2023-08-06: 25 mg via ORAL
  Filled 2023-08-06: qty 1

## 2023-08-06 MED ORDER — IPRATROPIUM BROMIDE 0.02 % IN SOLN
0.5000 mg | Freq: Two times a day (BID) | RESPIRATORY_TRACT | Status: DC
  Administered 2023-08-06 – 2023-08-07 (×2): 0.5 mg via RESPIRATORY_TRACT
  Filled 2023-08-06 (×2): qty 2.5

## 2023-08-06 NOTE — Progress Notes (Addendum)
[facility] Progress Note  Patient: Miranda Perkins    XBM:[id number]  DOA: 07/31/2023     Date of Service: the patient was seen and examined on 08/06/2023  Chief Complaint  Patient presents with   Shortness of Breath   Brief hospital course: Miranda Perkins is a 62 y.o. 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 She was admitted for severe sepsis in the setting of left lower lobe pneumonia.  She was started on ceftriaxone, azithromycin and given IV fluid.    Assessment and Plan: 1)Left lower lobe consolidation: Unclear if this is pneumonia or postobstructive process from underlying left lung cancer. Mucous plugging is a possibility. PCT 2.42.  - Continue ceftriaxone, flagyl.  -Continue bronchodilators mucolytic's and incentive spirometry Discussed with  Dr. Ellin Saba -Chemo on 08/04/2023 was skipped due to acute infection -Patient will follow-up with Dr. Ellin Saba in the office on 08/09/2023 -Dyspnea on exertion improving -Continue Rocephin and Flagyl at this time   2)Metastatic primary lung cancer, left-with mets to right adrenal gland s/p XRT.  - Started chemotherapy on 07/14/23.   -Please see discussions with Dr. Ellin Saba as above #1  3) insomnia and anorexia--- in the setting of underlying malignancy and chemo with radiation -Give Remeron 15 nightly -Nutritional supplements advised  4) recurrent C. difficile infections--- continue oral vancomycin -Currently on IV Flagyl for #1 above -Avoid over excessive ,prolonged antibiotics given propensity towards C. difficile infections -Patient will need to continue oral vancomycin  for at least 5 days after completing antibiotics per guidelines  Goal of care counseling: Remains full code.    Anemia of chronic disease: Drop in Hgb likely dilutional from IV fluid, has stabilized.  Patient denies melena or hematochezia.  Occasional small blood tinge sputum when she coughs.  She denies frank hemoptysis. Mild iron deficiency, started oral supplement.  Folate and B12 within normal range. Hb around 8, 7.7 today stable -Monitor H&H  Peripheral edema/anasarca: Suspect related to hypoalbuminemia and worsened with IV fluids initially - Albumin + lasix, no charted UOP and weight up considerably today, still grossly volume up with stable Cr. Will increase lasix to 80mg  IV, monitor I/O, renal parameters.  - Echo from 9/5 reviewed, no significant CHF.  10/2 Follow-up venous duplex  Sinus tachycardia: Multifactorial including anemia, deconditioning.  Recent TSH normal.  Recent echo without significant finding. - Tx infection as above, optimize volume status, monitor anemia.  -Continue home metoprolol, increase to 50mg  -Changed DuoNeb to Xopenex with Atrovent -Optimize electrolytes   Hypotension: Mild, resolved.  - Continue home midodrine 5mg  BID.   Hypokalemia/hypomagnesemia: Continues in light of loop diuretic.  - Continue supplementation today.   Mild hyponatremia: likely hypervolemic. - Overall improved, continue IV Lasix as above   Physical deconditioning: Lives with husband.  Uses rolling walker at baseline. -PT/OT evaluated, will arrange home health therapies. Needs to get OOB often to maintain strength   Decreased oral intake: Likely from underlying cancer chemotherapy. -Consult dietitian   Class II obesity Body mass index is 36.86 kg/m.   Stage 2 pressure injury of left buttocks, POA: Offload, optimize nutrition.    Body mass index is 35.23 kg/m.  Nutrition Problem: Moderate Malnutrition Etiology:  acute illness (lung cancer with adrenal  mets) Interventions: Interventions: Ensure Enlive (each supplement provides 350kcal and 20 grams of protein), MVI, Magic cup  Pressure Injury 08/01/23 Buttocks Left Stage 2 -  Partial thickness loss of dermis presenting as a shallow open injury with a red, pink wound bed without slough. opening in the skin supposedly looks like Moisture Associated skin tear (Active)  08/01/23 0131  Location: Buttocks  Location Orientation: Left  Staging: Stage 2 -  Partial thickness loss of dermis presenting as a shallow open injury with a red, pink wound bed without slough.  Wound Description (Comments): opening in the skin supposedly looks like Moisture Associated skin tear  Present on Admission:   Dressing Type None 08/03/23 1319     Diet: Heart healthy diet DVT Prophylaxis: Subcutaneous Lovenox   Advance goals of care discussion: Full code  Family Communication: Discussed with husband at bedside  Disposition:  Pt is from home, admitted with pneumonia and volume overload, still has pneumonia and lower extremity edema on IV Lasix, which precludes a safe discharge. Discharge to home with home health services, when stable, may need few more days to stay in the hospital.  Subjective:   -- Fatigue persist -Encouraged to do incentive spirometry more often -Husband at bedside states appetite remains poor -Dyspnea and leg swelling persist   Physical Exam: Gen:- Awake Alert, in no acute distress , immunization HEENT:- Wise.AT, No sclera icterus Neck-Supple Neck,No JVD,.  Lungs-fair air movement, no wheezing  CV- S1, S2 normal, RRR, left chest Port-A-Cath in situ Abd-  +ve B.Sounds, Abd Soft, No tenderness,    Extremity/Skin:-2+ pitting edema,   good pedal pulses  Psych-affect is appropriate, oriented x3 Neuro-generalized weakness, no new focal deficits, no tremors   Vitals:   08/06/23 0023 08/06/23 0417 08/06/23 0752 08/06/23 1604  BP: 110/60 (!) 90/58  (!) 104/55  Pulse: (!) 122 100  (!) 114   Resp: 20 20  19   Temp: 98.8 F (37.1 C) 97.9 F (36.6 C)  99.6 F (37.6 C)  TempSrc: Oral   Oral  SpO2: 92% 97% 94% 93%  Weight:  99 kg    Height:        Intake/Output Summary (Last 24 hours) at 08/06/2023 1813 Last data filed at 08/06/2023 1100 Gross per 24 hour  Intake 698.54 ml  Output 900 ml  Net -201.46 ml   Filed Weights   08/04/23 0300 08/05/23 0500 08/06/23 0417  Weight: 102.4 kg 102 kg 99 kg   CBC: Recent Labs  Lab 07/31/23 1529 08/01/23 0626 08/02/23 1535 08/03/23 0459 08/04/23 0413 08/05/23 0403 08/06/23 0334  WBC 7.0   < > 7.4 8.0 8.8 11.3* 12.9*  NEUTROABS 4.6  --   --   --   --   --   --   HGB 9.4*   < > 7.8* 8.1* 7.7* 7.7* 8.0*  HCT 30.9*   < > 25.9* 27.5* 26.6* 26.3* 26.3*  MCV 81.3   < > 83.5 82.8 84.4 84.3 83.2  PLT 471*   < > 555* 550* 532* 527* 566*   < > = values in this interval not displayed.   Basic Metabolic Panel: Recent Labs  Lab 08/01/23 0626 08/02/23 1535 08/03/23 0459 08/04/23 0413 08/05/23 0403 08/06/23 0334  NA 132* 135 133* 134* 132* 134*  K 2.9* 3.4* 3.0* 3.4* 2.9* 3.2*  CL 100 104 101 101 98 99  CO2 22 23 23 25 26 25   GLUCOSE 85 161* 96 94  98 109*  BUN 8 10 8  6* 8 9  CREATININE 0.44 0.44 0.43* 0.41* 0.47 0.51  CALCIUM 7.7* 8.0* 8.0* 7.9* 7.6* 7.8*  MG 1.5*  --   --   --  1.4*  --   PHOS  --   --   --   --  3.0 2.9    Studies: No results found.  Scheduled Meds:  Chlorhexidine Gluconate Cloth  6 each Topical Q0600   enoxaparin (LOVENOX) injection  40 mg Subcutaneous Q24H   feeding supplement  237 mL Oral TID BM   furosemide  40 mg Intravenous BID   guaiFENesin  600 mg Oral BID   levalbuterol  0.63 mg Nebulization BID   And   ipratropium  0.5 mg Nebulization BID   iron polysaccharides  150 mg Oral Daily   metoprolol tartrate  25 mg Oral BID   midodrine  5 mg Oral BID WC   mirtazapine  15 mg Oral QHS   multivitamin with minerals  1 tablet Oral Daily   vancomycin  125 mg Oral Daily   Continuous Infusions:   cefTRIAXone (ROCEPHIN)  IV Stopped (08/05/23 2138)   PRN Meds: acetaminophen **OR** acetaminophen, HYDROcodone bit-homatropine, levalbuterol, ondansetron **OR** ondansetron (ZOFRAN) IV  Author: Shon Hale, MD  [facility] 08/06/2023 6:13 PM  To reach On-call, see care teams to locate the attending and reach out to them via [email or web address]. If 7PM-7AM, please contact night-coverage If you still have difficulty reaching the attending provider, please page the [facility] (Director on Call) for [facility] on amion for assistance.

## 2023-08-06 NOTE — Progress Notes (Signed)
   08/06/23 1604  Assess: MEWS Score  Temp 99.6 F (37.6 C)  BP (!) 104/55  MAP (mmHg) 69  Pulse Rate (!) 114  Resp 19  SpO2 93 %  O2 Device Room Air  Assess: MEWS Score  MEWS Temp 0  MEWS Systolic 0  MEWS Pulse 2  MEWS RR 0  MEWS LOC 0  MEWS Score 2  MEWS Score Color Yellow  Assess: if the MEWS score is Yellow or Red  Were vital signs accurate and taken at a resting state? Yes  Does the patient meet 2 or more of the SIRS criteria? No  Does the patient have a confirmed or suspected source of infection? No  Notify: Charge Nurse/RN  Name of Charge Nurse/RN Notified Psychologist, prison and probation services  Provider Notification  Provider Name/Title Emokpae  Date Provider Notified 08/06/23  Time Provider Notified [phone]  Method of Notification Face-to-face  Notification Reason Other (Comment)  Provider response No new orders  Date of Provider Response 08/06/23  Time of Provider Response 1805  Assess: SIRS CRITERIA  SIRS Temperature  0  SIRS Pulse 1  SIRS Respirations  0  SIRS WBC 0  SIRS Score Sum  1

## 2023-08-07 DIAGNOSIS — J181 Lobar pneumonia, unspecified organism: Secondary | ICD-10-CM | POA: Diagnosis not present

## 2023-08-07 LAB — CBC
HCT: 28.8 % — ABNORMAL LOW (ref 36.0–46.0)
Hemoglobin: 8.5 g/dL — ABNORMAL LOW (ref 12.0–15.0)
MCH: 24.6 pg — ABNORMAL LOW (ref 26.0–34.0)
MCHC: 29.5 g/dL — ABNORMAL LOW (ref 30.0–36.0)
MCV: 83.5 fL (ref 80.0–100.0)
Platelets: 607 10*3/uL — ABNORMAL HIGH (ref 150–400)
RBC: 3.45 MIL/uL — ABNORMAL LOW (ref 3.87–5.11)
RDW: 22.3 % — ABNORMAL HIGH (ref 11.5–15.5)
WBC: 12.7 10*3/uL — ABNORMAL HIGH (ref 4.0–10.5)
nRBC: 0 % (ref 0.0–0.2)

## 2023-08-07 LAB — RENAL FUNCTION PANEL
Albumin: 2.2 g/dL — ABNORMAL LOW (ref 3.5–5.0)
Anion gap: 9 (ref 5–15)
BUN: 11 mg/dL (ref 8–23)
CO2: 28 mmol/L (ref 22–32)
Calcium: 8 mg/dL — ABNORMAL LOW (ref 8.9–10.3)
Chloride: 97 mmol/L — ABNORMAL LOW (ref 98–111)
Creatinine, Ser: 0.45 mg/dL (ref 0.44–1.00)
GFR, Estimated: >60 mL/min (ref >60)
Glucose, Bld: 92 mg/dL (ref 70–99)
Phosphorus: 3.2 mg/dL (ref 2.5–4.6)
Potassium: 3.2 mmol/L — ABNORMAL LOW (ref 3.5–5.1)
Sodium: 134 mmol/L — ABNORMAL LOW (ref 135–145)

## 2023-08-07 MED ORDER — METOPROLOL TARTRATE 25 MG PO TABS: 12.5000 mg | ORAL_TABLET | Freq: Two times a day (BID) | ORAL | 1 refills | Status: AC

## 2023-08-07 MED ORDER — HEPARIN SOD (PORK) LOCK FLUSH 100 UNIT/ML IV SOLN
500.0000 [IU] | Freq: Once | INTRAVENOUS | Status: AC
  Administered 2023-08-07: 500 [IU] via INTRAVENOUS
  Filled 2023-08-07: qty 5

## 2023-08-07 MED ORDER — PROCHLORPERAZINE MALEATE 10 MG PO TABS: 10.0000 mg | ORAL_TABLET | Freq: Four times a day (QID) | ORAL | 2 refills | Status: AC | PRN

## 2023-08-07 MED ORDER — POTASSIUM CHLORIDE CRYS ER 20 MEQ PO TBCR
40.0000 meq | EXTENDED_RELEASE_TABLET | ORAL | Status: AC
  Administered 2023-08-07 (×2): 40 meq via ORAL
  Filled 2023-08-07 (×2): qty 2

## 2023-08-07 MED ORDER — ALBUTEROL SULFATE (2.5 MG/3ML) 0.083% IN NEBU
2.5000 mg | INHALATION_SOLUTION | Freq: Four times a day (QID) | RESPIRATORY_TRACT | 1 refills | Status: DC | PRN
Start: 2023-08-07 — End: 2023-09-03

## 2023-08-07 MED ORDER — POLYSACCHARIDE IRON COMPLEX 150 MG PO CAPS: 150.0000 mg | ORAL_CAPSULE | Freq: Every day | ORAL | 3 refills | Status: AC

## 2023-08-07 MED ORDER — SACCHAROMYCES BOULARDII 250 MG PO CAPS: 250.0000 mg | ORAL_CAPSULE | Freq: Two times a day (BID) | ORAL | 0 refills | Status: AC

## 2023-08-07 MED ORDER — POTASSIUM CHLORIDE ER 20 MEQ PO TBCR
20.0000 meq | EXTENDED_RELEASE_TABLET | Freq: Every day | ORAL | 1 refills | Status: AC
Start: 2023-08-07 — End: ?

## 2023-08-07 MED ORDER — METOPROLOL TARTRATE 25 MG PO TABS
12.5000 mg | ORAL_TABLET | Freq: Two times a day (BID) | ORAL | Status: DC
  Administered 2023-08-07: 12.5 mg via ORAL
  Filled 2023-08-07: qty 1

## 2023-08-07 MED ORDER — FUROSEMIDE 40 MG PO TABS
40.0000 mg | ORAL_TABLET | Freq: Every day | ORAL | 3 refills | Status: AC
Start: 2023-08-07 — End: 2024-08-06

## 2023-08-07 MED ORDER — HYDROCODONE BIT-HOMATROP MBR 5-1.5 MG/5ML PO SOLN: 5.0000 mL | Freq: Four times a day (QID) | ORAL | 0 refills | Status: AC | PRN

## 2023-08-07 MED ORDER — ACETAMINOPHEN 325 MG PO TABS: 650.0000 mg | ORAL_TABLET | Freq: Four times a day (QID) | ORAL | Status: AC | PRN

## 2023-08-07 MED ORDER — FOLIC ACID 1 MG PO TABS: 1.0000 mg | ORAL_TABLET | Freq: Every day | ORAL | 3 refills | Status: AC

## 2023-08-07 MED ORDER — MIRTAZAPINE 15 MG PO TABS: 15.0000 mg | ORAL_TABLET | Freq: Every day | ORAL | 2 refills | Status: DC

## 2023-08-07 MED ORDER — MIDODRINE HCL 10 MG PO TABS: 10.0000 mg | ORAL_TABLET | Freq: Two times a day (BID) | ORAL | 1 refills | Status: AC

## 2023-08-07 MED ORDER — ENSURE ENLIVE PO LIQD: 237.0000 mL | Freq: Three times a day (TID) | ORAL | Status: AC

## 2023-08-07 MED ORDER — VANCOMYCIN HCL 125 MG PO CAPS: 125.0000 mg | ORAL_CAPSULE | Freq: Two times a day (BID) | ORAL | 0 refills | Status: AC

## 2023-08-07 NOTE — Discharge Summary (Incomplete)
Miranda Perkins, is a 62 y.o. female  DOB 07-15-1961  MRN [id number].  Admission date:  07/31/2023  Admitting Physician  Mauricio Annett Gula, MD  Discharge Date:  08/07/2023   Primary MD  [name], DO  Recommendations for primary care physician for things to follow:   1)Please follow-up with hematologist/oncologist Dr. [name], MD---on Monday 08/09/23 at 0800 am for recheck and reevaluation and repeat CBC and CMP blood test and discuss possible chemotherapy -Address: inside [facility] (4th Floor), [address] [place], [place], [place] [address] Phone: [phone]  2)Please Note that has been several changes to your medications  3) your albumin is very low----the only way to bring up the albumin is to eat better, please drink protein shakes  4) your low albumin will make the fluid and swelling in your legs perpetuate--- please use compression stockings as advised and keep your legs elevated as much as possible  Admission Diagnosis  Shortness of breath [R06.02] Pneumonia [J18.9] History of lung cancer [Z85.118] HCAP (healthcare-associated pneumonia) [J18.9] Severe sepsis (HCC) [A41.9, R65.20]   Discharge Diagnosis  Shortness of breath [R06.02] Pneumonia [J18.9] History of lung cancer [Z85.118] HCAP (healthcare-associated pneumonia) [J18.9] Severe sepsis (HCC) [A41.9, R65.20]    Principal Problem:   Left lower lobe consolidation (HCC) Active Problems:   Hypokalemia   Metastatic primary lung cancer, left (HCC)   Class II obesity   Pressure injury of skin   Malnutrition of moderate degree      Past Medical History:  Diagnosis Date   Adrenal hemorrhage (HCC)    Anemia    Arthritis    Cancer (HCC)    skin cancer - basal   Dyspnea    Edema of both lower extremities    Endometrial polyp    Hirsutism    History of 2019 novel coronavirus disease (COVID-19) 12/21/2020    positive home result documented in pcp note in epic 12-23-2020,  residual doe, pulmonology consult w/ dr wert note in [facility] 03-20-2021   History of basal cell carcinoma (BCC) excision 2016   forehead, per pt no recurrence   History of CVA (cerebrovascular accident) without residual deficits 1993   pt stated , while on Chloramphenicol, no residuaL; [medication given to treat tick bite] ; patient states  "i stroked out right next to my doctor , he said i passed out and that was my only symptom" ;  no resiudal   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 branch,  work-up results in epic 03/ 2022 (normal nuclear stress test, normal echo, no arrhythmia's per event monitor);   (05-28-2021 per pt no symptoms since 03/ 2022)   Multiple thyroid nodules    endocrinologist--- dr g. nida,  hx benign bx, clinically euthyroid   Osteopenia 12/2017   T score -1.2 FRAX 4.9% / 0.3%   PMB (postmenopausal  bleeding)    Pneumonia    x 2   Post-COVID chronic dyspnea    Pre-diabetes    Rosacea    Stroke (HCC)    Thickened endometrium     Past Surgical History:  Procedure Laterality Date   ANTERIOR AND POSTERIOR REPAIR N/A 08/21/2020   Procedure: ANTERIOR (CYSTOCELE) AND POSTERIOR REPAIR (RECTOCELE);  Surgeon: Genia Del, MD;  Location: [facility];  Service: Gynecology;  Laterality: N/A;  requesting 9:00am OR time  requests one hour   ANTERIOR CERVICAL DECOMP/DISCECTOMY FUSION N/A 09/27/2017   Procedure: ANTERIOR CERVICAL DECOMPRESSION/DISCECTOMY FUSION CERVICAL FOUR-FIVE ,CERVICAL FIVE-SIX,CERVICAL SIX-SEVEN;  Surgeon: Donalee Citrin, MD;  Location: [facility] OR;  Service: Neurosurgery;  Laterality: N/A;   AXILLARY LYMPH NODE BIOPSY Right 06/24/2023   Procedure: AXILLARY LYMPH NODE BIOPSY;  Surgeon: Franky Macho, MD;  Location: [facility];  Service:  General;  Laterality: Right;   BRONCHIAL BIOPSY  06/14/2023   Procedure: BRONCHIAL BIOPSIES;  Surgeon: Leslye Peer, MD;  Location: [facility] ENDOSCOPY;  Service: Pulmonary;;   BRONCHIAL BRUSHINGS  06/14/2023   Procedure: BRONCHIAL BRUSHINGS;  Surgeon: Leslye Peer, MD;  Location: [facility] ENDOSCOPY;  Service: Pulmonary;;   BRONCHIAL NEEDLE ASPIRATION BIOPSY  06/14/2023   Procedure: BRONCHIAL NEEDLE ASPIRATION BIOPSIES;  Surgeon: Leslye Peer, MD;  Location: [facility] ENDOSCOPY;  Service: Pulmonary;;   CARPAL TUNNEL RELEASE Left 09/27/2017   Procedure: CARPAL TUNNEL RELEASE;  Surgeon: Donalee Citrin, MD;  Location: [facility] OR;  Service: Neurosurgery;  Laterality: Left;   DILATATION & CURETTAGE/HYSTEROSCOPY WITH MYOSURE N/A 06/03/2021   Procedure: DILATATION & CURETTAGE/HYSTEROSCOPY WITH MYOSURE;  Surgeon: Genia Del, MD;  Location: [facility];  Service: Gynecology;  Laterality: N/A;   FOOT SURGERY     bone removed from pinky toe   HEMOSTASIS CONTROL  06/14/2023   Procedure: HEMOSTASIS CONTROL;  Surgeon: Leslye Peer, MD;  Location: [facility] ENDOSCOPY;  Service: Pulmonary;;   INGUINAL HERNIA REPAIR Right 1995   KNEE ARTHROSCOPY W/ MENISCAL REPAIR Left 06/21/2017   dr Netta Corrigan   LEG SURGERY  1972   MOHS SURGERY  2016   [facility] of forehead   MOUTH SURGERY  2015   removal and cauterization pallete of cancerous lesion   PORTACATH PLACEMENT N/A 06/24/2023   Procedure: INSERTION PORT-A-CATH;  Surgeon: Franky Macho, MD;  Location: [facility];  Service: General;  Laterality: N/A;   TONSILLECTOMY  1979   TONSILLECTOMY  1977   TOTAL KNEE ARTHROPLASTY Left 01/26/2018   Procedure: LEFT TOTAL KNEE ARTHROPLASTY;  Surgeon: Ranee Gosselin, MD;  Location: [facility];  Service: Orthopedics;  Laterality: Left;   TUBAL LIGATION     VIDEO BRONCHOSCOPY WITH ENDOBRONCHIAL ULTRASOUND Bilateral 06/14/2023   Procedure: VIDEO BRONCHOSCOPY WITH ENDOBRONCHIAL ULTRASOUND;  Surgeon: Leslye Peer, MD;  Location: [facility] ENDOSCOPY;   Service: Pulmonary;  Laterality: Bilateral;       HPI  from the history and physical done on the day of admission:  HPI: Miranda Perkins is a 62 y.o. 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   Recent hospitalization 09/3 to 07/11/23 for post obstructive pneumonia complicated with severe sepsis.   Patient has been getting palliative chemotherapy, started on 07/14/23.    Over last 3 to 4 days, patient has been experiencing productive cough with thick sputum, worsening dyspnea and pleuritic chest pain. Generalized malaise and for the last 2 nights fever.  She also noted worsening lower extremity edema, apparently she has not been taking prescribed furosemide.  09/25 Oncology nurse was contacted by patients daughter, regarding worsening lower extremity edema, dyspnea, fever and hypoxia (02 saturation 88% on room air). She was advised to take patient to the ED. At home she has been using albuterol with no improvement in her symptoms.    At the time of my examination patient is having moderate dyspnea, associated with cough and pleuritic chest pain. No improving or worsening factors.    Review of Systems: As mentioned in the history of present illness. All other systems reviewed and are negative.     Hospital Course:    Brief hospital course: Miranda Perkins is a 62 y.o. 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 She was admitted for severe sepsis in the setting of left lower lobe pneumonia.  She was started on  ceftriaxone, azithromycin and given IV fluid.      Assessment and Plan: 1)Left lower lobe consolidation: Unclear if this is pneumonia or postobstructive process from underlying left lung cancer. Mucous plugging is a possibility. PCT 2.42.  - Continue ceftriaxone, flagyl.  -Continue bronchodilators mucolytic's and incentive spirometry Discussed with  Dr. Ellin Saba -Chemo on 08/04/2023 was skipped due to acute infection -Patient will follow-up with Dr. Ellin Saba in the office on 08/09/2023 -Dyspnea on exertion improving -Continue Rocephin and Flagyl at this time   2)Metastatic primary lung cancer, left-with mets to right adrenal gland s/p XRT.  - Started chemotherapy on 07/14/23.   -Please see discussions with Dr. Ellin Saba as above #1   3) insomnia and anorexia--- in the setting of underlying malignancy and chemo with radiation -Give Remeron 15 nightly -Nutritional supplements advised   4) recurrent C. difficile infections--- continue oral vancomycin -Currently on IV Flagyl for #1 above -Avoid over excessive ,prolonged antibiotics given propensity towards C. difficile infections -Patient will need to continue oral vancomycin for at least 5 days after completing antibiotics per guidelines   Goal of care counseling: Remains full code.    Anemia of chronic disease: Drop in Hgb likely dilutional from IV fluid, has stabilized.  Patient denies melena or hematochezia.  Occasional small blood tinge sputum when she coughs.  She denies frank hemoptysis. Mild iron deficiency, started oral supplement.  Folate and B12 within normal range. Hb around 8, 7.7 today stable -Monitor H&H   Peripheral edema/anasarca: Suspect related to hypoalbuminemia and worsened with IV fluids initially - Albumin + lasix, no charted UOP and weight up considerably today, still grossly volume up with stable Cr. Will increase lasix to 80mg  IV, monitor I/O, renal parameters.  - Echo from 9/5 reviewed, no significant CHF.   10/2 Follow-up venous duplex   Sinus tachycardia: Multifactorial including anemia, deconditioning.  Recent TSH normal.  Recent echo without significant finding. - Tx infection as above, optimize volume status, monitor anemia.  -Continue home metoprolol, increase to 50mg  -Changed DuoNeb to Xopenex with Atrovent -Optimize electrolytes   Hypotension: Mild, resolved.  - Continue home midodrine 5mg  BID.   Hypokalemia/hypomagnesemia: Continues in light of loop diuretic.  - Continue supplementation today.   Mild hyponatremia: likely hypervolemic. - Overall improved, continue IV Lasix as  above   Physical deconditioning: Lives with husband.  Uses rolling walker at baseline. -PT/OT evaluated, will arrange home health therapies. Needs to get OOB often to maintain strength   Decreased oral intake: Likely from underlying cancer chemotherapy. -Consult dietitian   Class II obesity Body mass index is 36.86 kg/m.   Stage 2 pressure injury of left buttocks, POA: Offload, optimize nutrition.      Body mass index is 35.23 kg/m.  Nutrition Problem: Moderate Malnutrition Etiology: acute illness (lung cancer with adrenal mets) Interventions: Interventions: Ensure Enlive (each supplement provides 350kcal and 20 grams of protein), MVI, Magic cup     Discharge Condition: ***  Follow UP   Follow-up Information     [place], [facility] [place] Follow up.   Contact information: [address] [place] [address] [phone]         [name], MD. Go on 08/09/2023.   Specialty: Hematology Contact information: [address] [place] [address] [phone]                  Consults obtained -discussed with Dr. Ellin Saba patient's primary oncologist  Diet and Activity recommendation:  As advised  Discharge Instructions    **** Discharge Instructions     Call MD for:  difficulty breathing, headache or visual disturbances   Complete by: As  directed    Call MD for:  persistant dizziness or light-headedness   Complete by: As directed    Call MD for:  persistant nausea and vomiting   Complete by: As directed    Call MD for:  severe uncontrolled pain   Complete by: As directed    Call MD for:  temperature >100.4   Complete by: As directed    Diet - low sodium heart healthy   Complete by: As directed    Discharge instructions   Complete by: As directed    1)Please follow-up with hematologist/oncologist Dr. [name], MD---on Monday 08/09/23 at 0800 am for recheck and reevaluation and repeat CBC and CMP blood test and discuss possible chemotherapy -Address: inside [facility] (4th Floor), [address] [place], [place], [place] [address] Phone: [phone]  2)Please Note that has been several changes to your medications  3) your albumin is very low----the only way to bring up the albumin is to eat better, please drink protein shakes  4) your low albumin will make the fluid and swelling in your legs perpetuate--- please use compression stockings as advised and keep your legs elevated as much as possible   Discharge wound care:   Complete by: As directed    As advised   Increase activity slowly   Complete by: As directed          Discharge Medications     Allergies as of 08/07/2023       Reactions   Influenza Vaccines Anaphylaxis, Other (See Comments)   Per patient   Penicillins Anaphylaxis, Other (See Comments)   Articaine Other (See Comments)   Caused infection   Cortisone Other (See Comments)   Turned red and ran a low grade fever for 3 days   Other Rash   bandaids- skin turns red and a rash         Medication List     STOP taking these medications    doxycycline 50 MG tablet Commonly known as: ADOXA   megestrol 20 MG tablet Commonly known as: MEGACE   potassium chloride 10 MEQ tablet Commonly known as:  KLOR-CON M       TAKE these medications    acetaminophen 325 MG  tablet Commonly known as: TYLENOL Take 2 tablets (650 mg total) by mouth every 6 (six) hours as needed for mild pain (or Fever >/= 101).   albuterol (2.5 MG/3ML) 0.083% nebulizer solution Commonly known as: PROVENTIL Take 3 mLs (2.5 mg total) by nebulization every 6 (six) hours as needed for wheezing or shortness of breath.   feeding supplement Liqd Take 237 mLs by mouth 3 (three) times daily between meals.   folic acid 1 MG tablet Commonly known as: FOLVITE Take 1 tablet (1 mg total) by mouth daily.   furosemide 40 MG tablet Commonly known as: Lasix Take 1 tablet (40 mg total) by mouth daily. What changed:  medication strength how much to take   guaiFENesin 600 MG 12 hr tablet Commonly known as: MUCINEX Take 2 tablets (1,200 mg total) by mouth 2 (two) times daily.   HYDROcodone bit-homatropine 5-1.5 MG/5ML syrup Commonly known as: HYCODAN Take 5 mLs by mouth every 6 (six) hours as needed (refractory cough).   iron polysaccharides 150 MG capsule Commonly known as: NIFEREX Take 1 capsule (150 mg total) by mouth daily. Start taking on: August 08, 2023   metoprolol tartrate 25 MG tablet Commonly known as: LOPRESSOR Take 0.5 tablets (12.5 mg total) by mouth 2 (two) times daily. What changed: how much to take   midodrine 10 MG tablet Commonly known as: PROAMATINE Take 1 tablet (10 mg total) by mouth 2 (two) times daily with a meal. For soft BP   mirtazapine 15 MG tablet Commonly known as: REMERON Take 1 tablet (15 mg total) by mouth at bedtime.   multivitamin with minerals Tabs tablet Take 1 tablet by mouth daily.   oxyCODONE 5 MG immediate release tablet Commonly known as: Oxy IR/ROXICODONE Take 1 tablet (5 mg total) by mouth every 4 (four) hours as needed for moderate pain.   Potassium Chloride ER 20 MEQ Tbcr Take 1 tablet (20 mEq total) by mouth daily. 1 tab daily by mouth--- take while taking Lasix/furosemide   prochlorperazine 10 MG tablet Commonly known as:  COMPAZINE Take 1 tablet (10 mg total) by mouth every 6 (six) hours as needed for nausea or vomiting.   saccharomyces boulardii 250 MG capsule Commonly known as: FLORASTOR Take 1 capsule (250 mg total) by mouth 2 (two) times daily. What changed: when to take this   vancomycin 125 MG capsule Commonly known as: VANCOCIN Take 1 capsule (125 mg total) by mouth 2 (two) times daily for 5 days. For C diff Prophylaxis               Discharge Care Instructions  (From admission, onward)           Start     Ordered   08/07/23 0000  Discharge wound care:       Comments: As advised   08/07/23 1326            Major procedures and Radiology Reports - PLEASE review detailed and final reports for all details, in brief -   ***  US Venous Img Lower Bilateral (DVT)  Result Date: 08/05/2023 CLINICAL DATA:  Bilateral lower extremity edema EXAM: BILATERAL LOWER EXTREMITY VENOUS DOPPLER ULTRASOUND TECHNIQUE: Gray-scale sonography with graded compression, as well as color Doppler and duplex ultrasound were performed to evaluate the lower extremity deep venous systems from the level of the common femoral vein and including the common femoral, femoral,  profunda femoral, popliteal and calf veins including the posterior tibial, peroneal and gastrocnemius veins when visible. The superficial great saphenous vein was also interrogated. Spectral Doppler was utilized to evaluate flow at rest and with distal augmentation maneuvers in the common femoral, femoral and popliteal veins. COMPARISON:  None Available. FINDINGS: RIGHT LOWER EXTREMITY Common Femoral Vein: No evidence of thrombus. Normal compressibility, respiratory phasicity and response to augmentation. Saphenofemoral Junction: No evidence of thrombus. Normal compressibility and flow on color Doppler imaging. Profunda Femoral Vein: No evidence of thrombus. Normal compressibility and flow on color Doppler imaging. Femoral Vein: No evidence of  thrombus. Normal compressibility, respiratory phasicity and response to augmentation. Popliteal Vein: No evidence of thrombus. Normal compressibility, respiratory phasicity and response to augmentation. Calf Veins: No evidence of thrombus. Normal compressibility and flow on color Doppler imaging. Superficial Great Saphenous Vein: No evidence of thrombus. Normal compressibility. Venous Reflux:  None. Other Findings:  None. LEFT LOWER EXTREMITY Common Femoral Vein: No evidence of thrombus. Normal compressibility, respiratory phasicity and response to augmentation. Saphenofemoral Junction: No evidence of thrombus. Normal compressibility and flow on color Doppler imaging. Profunda Femoral Vein: No evidence of thrombus. Normal compressibility and flow on color Doppler imaging. Femoral Vein: No evidence of thrombus. Normal compressibility, respiratory phasicity and response to augmentation. Popliteal Vein: No evidence of thrombus. Normal compressibility, respiratory phasicity and response to augmentation. Calf Veins: No evidence of thrombus. Normal compressibility and flow on color Doppler imaging. Superficial Great Saphenous Vein: No evidence of thrombus. Normal compressibility. Venous Reflux:  None. Other Findings:  None. IMPRESSION: No evidence of deep venous thrombosis in either lower extremity. Electronically Signed   By: Malachy Moan M.D.   On: 08/05/2023 10:10   DG Chest Portable 1 View  Result Date: 07/31/2023 CLINICAL DATA:  Shortness of breath.  Lung cancer. EXAM: PORTABLE CHEST 1 VIEW COMPARISON:  07/06/2023 FINDINGS: Right lung clear. Left parahilar mass again noted. Left base collapse/consolidation is similar to prior with small left pleural effusion. Left Port-A-Cath remains in place. No acute bony abnormality. Telemetry leads overlie the chest. IMPRESSION: No substantial interval change. Persistent left base collapse/consolidation with small left effusion. Electronically Signed   By: Kennith Center  M.D.   On: 07/31/2023 16:25    Micro Results   *** Recent Results (from the past 240 hour(s))  Culture, blood (Routine x 2)     Status: None   Collection Time: 07/31/23  3:29 PM   Specimen: BLOOD RIGHT WRIST  Result Value Ref Range Status   Specimen Description BLOOD RIGHT WRIST  Final   Special Requests Blood Culture adequate volume  Final   Culture   Final    NO GROWTH 5 DAYS Performed at [facility], [address], [place], [place] [address]    Report Status 08/05/2023 FINAL  Final  Culture, blood (Routine x 2)     Status: None   Collection Time: 07/31/23  3:29 PM   Specimen: Right Antecubital; Blood  Result Value Ref Range Status   Specimen Description RIGHT ANTECUBITAL  Final   Special Requests Blood Culture adequate volume  Final   Culture   Final    NO GROWTH 5 DAYS Performed at [facility], [address], [place], [place] [address]    Report Status 08/05/2023 FINAL  Final  Resp panel by RT-PCR (RSV, Flu A&B, Covid) Anterior Nasal Swab     Status: None   Collection Time: 07/31/23  7:00 PM   Specimen: Anterior Nasal Swab  Result Value Ref  Range Status   SARS Coronavirus 2 by RT PCR NEGATIVE NEGATIVE Final    Comment: (NOTE) SARS-CoV-2 target nucleic acids are NOT DETECTED.  The SARS-CoV-2 RNA is generally detectable in upper respiratory specimens during the acute phase of infection. The lowest concentration of SARS-CoV-2 viral copies this assay can detect is 138 copies/mL. A negative result does not preclude SARS-Cov-2 infection and should not be used as the sole basis for treatment or other patient management decisions. A negative result may occur with  improper specimen collection/handling, submission of specimen other than nasopharyngeal swab, presence of viral mutation(s) within the areas targeted by this assay, and inadequate number of viral copies(<138 copies/mL). A negative result must be combined with clinical observations, patient history, and  epidemiological information. The expected result is Negative.  Fact Sheet for Patients:  BloggerCourse.com  Fact Sheet for Healthcare Providers:  SeriousBroker.it  This test is no t yet approved or cleared by the [place] FDA and  has been authorized for detection and/or diagnosis of SARS-CoV-2 by FDA under an Emergency Use Authorization (EUA). This EUA will remain  in effect (meaning this test can be used) for the duration of the COVID-19 declaration under Section 564(b)(1) of the Act, 21 U.S.C.section 360bbb-3(b)(1), unless the authorization is terminated  or revoked sooner.       Influenza A by PCR NEGATIVE NEGATIVE Final   Influenza B by PCR NEGATIVE NEGATIVE Final    Comment: (NOTE) The Xpert Xpress SARS-CoV-2/FLU/RSV plus assay is intended as an aid in the diagnosis of influenza from Nasopharyngeal swab specimens and should not be used as a sole basis for treatment. Nasal washings and aspirates are unacceptable for Xpert Xpress SARS-CoV-2/FLU/RSV testing.  Fact Sheet for Patients: BloggerCourse.com  Fact Sheet for Healthcare Providers: SeriousBroker.it  This test is not yet approved or cleared by the [place] FDA and has been authorized for detection and/or diagnosis of SARS-CoV-2 by FDA under an Emergency Use Authorization (EUA). This EUA will remain in effect (meaning this test can be used) for the duration of the COVID-19 declaration under Section 564(b)(1) of the Act, 21 U.S.C. section 360bbb-3(b)(1), unless the authorization is terminated or revoked.     Resp Syncytial Virus by PCR NEGATIVE NEGATIVE Final    Comment: (NOTE) Fact Sheet for Patients: BloggerCourse.com  Fact Sheet for Healthcare Providers: SeriousBroker.it  This test is not yet approved or cleared by the [place] FDA and has been  authorized for detection and/or diagnosis of SARS-CoV-2 by FDA under an Emergency Use Authorization (EUA). This EUA will remain in effect (meaning this test can be used) for the duration of the COVID-19 declaration under Section 564(b)(1) of the Act, 21 U.S.C. section 360bbb-3(b)(1), unless the authorization is terminated or revoked.  Performed at [facility], [address], [place], [place] [address]   MRSA Next Gen by PCR, Nasal     Status: None   Collection Time: 07/31/23  7:00 PM   Specimen: Anterior Nasal Swab  Result Value Ref Range Status   MRSA by PCR Next Gen NOT DETECTED NOT DETECTED Final    Comment: (NOTE) The GeneXpert MRSA Assay (FDA approved for NASAL specimens only), is one component of a comprehensive MRSA colonization surveillance program. It is not intended to diagnose MRSA infection nor to guide or monitor treatment for MRSA infections. Test performance is not FDA approved in patients less than 76 years old. Performed at [facility], [address], [place], [place] [address]   MRSA Next Gen by  PCR, Nasal     Status: None   Collection Time: 08/01/23  1:16 AM   Specimen: Nasal Mucosa; Nasal Swab  Result Value Ref Range Status   MRSA by PCR Next Gen NOT DETECTED NOT DETECTED Final    Comment: (NOTE) The GeneXpert MRSA Assay (FDA approved for NASAL specimens only), is one component of a comprehensive MRSA colonization surveillance program. It is not intended to diagnose MRSA infection nor to guide or monitor treatment for MRSA infections. Test performance is not FDA approved in patients less than 16 years old. Performed at [facility], [address] [place], [place] [address]     Today   Subjective    Miranda Perkins today has no ***          Patient has been seen and examined prior to discharge   Objective   Blood pressure (!) 99/58, pulse (!) 116, temperature 98.6 F (37 C), temperature source Oral, resp. rate 20, height 5\' 6"  (1.676 m), weight  95.8 kg, SpO2 95%.   Intake/Output Summary (Last 24 hours) at 08/07/2023 1327 Last data filed at 08/07/2023 1229 Gross per 24 hour  Intake 720 ml  Output 1350 ml  Net -630 ml    Exam Gen:- Awake Alert, no acute distress *** HEENT:- [place].AT, No sclera icterus Neck-Supple Neck,No JVD,.  Lungs-  CTAB , good air movement bilaterally CV- S1, S2 normal, regular Abd-  +ve B.Sounds, Abd Soft, No tenderness,    Extremity/Skin:- No  edema,   good pulses Psych-affect is appropriate, oriented x3 Neuro-no new focal deficits, no tremors ***   Data Review   CBC w Diff:  Lab Results  Component Value Date   WBC 12.7 (H) 08/07/2023   HGB 8.5 (L) 08/07/2023   HGB 12.9 04/06/2023   HCT 28.8 (L) 08/07/2023   HCT 39.8 04/06/2023   PLT 607 (H) 08/07/2023   PLT 343 04/06/2023   LYMPHOPCT 21 07/31/2023   MONOPCT 12 07/31/2023   EOSPCT 0 07/31/2023   BASOPCT 0 07/31/2023    CMP:  Lab Results  Component Value Date   NA 134 (L) 08/07/2023   NA 143 04/06/2023   K 3.2 (L) 08/07/2023   CL 97 (L) 08/07/2023   CO2 28 08/07/2023   BUN 11 08/07/2023   BUN 8 04/06/2023   CREATININE 0.45 08/07/2023   PROT 5.7 (L) 07/31/2023   PROT 6.7 04/06/2023   ALBUMIN 2.2 (L) 08/07/2023   ALBUMIN 4.4 04/06/2023   BILITOT 0.7 07/31/2023   BILITOT 0.5 04/06/2023   ALKPHOS 112 07/31/2023   AST 22 07/31/2023   ALT 23 07/31/2023  .  Total Discharge time is about 33 minutes  Shon Hale M.D on 08/07/2023 at 1:27 PM  Go to [email or web address] -  for contact info  [facility] - Office  [phone]

## 2023-08-07 NOTE — Plan of Care (Signed)
  Problem: Education: Goal: Knowledge of General Education information will improve Description: Including pain rating scale, medication(s)/side effects and non-pharmacologic comfort measures Outcome: Adequate for Discharge   Problem: Health Behavior/Discharge Planning: Goal: Ability to manage health-related needs will improve Outcome: Adequate for Discharge   Problem: Clinical Measurements: Goal: Ability to maintain clinical measurements within normal limits will improve Outcome: Adequate for Discharge Goal: Will remain free from infection Outcome: Adequate for Discharge Goal: Diagnostic test results will improve Outcome: Adequate for Discharge Goal: Respiratory complications will improve Outcome: Adequate for Discharge Goal: Cardiovascular complication will be avoided Outcome: Adequate for Discharge   Problem: Activity: Goal: Risk for activity intolerance will decrease Outcome: Adequate for Discharge   Problem: Nutrition: Goal: Adequate nutrition will be maintained Outcome: Adequate for Discharge   Problem: Coping: Goal: Level of anxiety will decrease Outcome: Adequate for Discharge   Problem: Elimination: Goal: Will not experience complications related to bowel motility Outcome: Adequate for Discharge Goal: Will not experience complications related to urinary retention Outcome: Adequate for Discharge   Problem: Pain Managment: Goal: General experience of comfort will improve Outcome: Adequate for Discharge   Problem: Safety: Goal: Ability to remain free from injury will improve Outcome: Adequate for Discharge   Problem: Skin Integrity: Goal: Risk for impaired skin integrity will decrease Outcome: Adequate for Discharge   Problem: Acute Rehab PT Goals(only PT should resolve) Goal: Pt Will Go Supine/Side To Sit Outcome: Adequate for Discharge Goal: Patient Will Transfer Sit To/From Stand Outcome: Adequate for Discharge Goal: Pt Will Transfer Bed To Chair/Chair  To Bed Outcome: Adequate for Discharge Goal: Pt Will Ambulate Outcome: Adequate for Discharge   Problem: Acute Rehab OT Goals (only OT should resolve) Goal: Pt. Will Perform Upper Body Bathing Outcome: Adequate for Discharge Goal: Pt. Will Perform Upper Body Dressing Outcome: Adequate for Discharge Goal: Pt. Will Transfer To Toilet Outcome: Adequate for Discharge Goal: Pt. Will Perform Toileting-Clothing Manipulation Outcome: Adequate for Discharge Goal: Pt/Caregiver Will Perform Home Exercise Program Outcome: Adequate for Discharge

## 2023-08-07 NOTE — Progress Notes (Signed)
D/c instructions, RX and follow up appts explained and provided to patient and husband verbalized understanding. No c/o pain or shrtness of breath at d/c.  Kemper Heupel, Kae Heller, RN

## 2023-08-07 NOTE — Progress Notes (Signed)
Patient slept some during the night. No complaints of pain or nausea. Continues to be a yellow mews. MD aware.  Plan of care ongoing.

## 2023-08-07 NOTE — Discharge Instructions (Signed)
1)Please follow-up with hematologist/oncologist Dr. [name], MD---on Monday 08/09/23 at 0800 am for recheck and reevaluation and repeat CBC and CMP blood test and discuss possible chemotherapy -Address: inside [facility] (4th Floor), [address] [place], [place], [place] [address] Phone: [phone]  2)Please Note that has been several changes to your medications  3) your albumin is very low----the only way to bring up the albumin is to eat better, please drink protein shakes  4) your low albumin will make the fluid and swelling in your legs perpetuate--- please use compression stockings as advised and keep your legs elevated as much as possible

## 2023-08-08 NOTE — Progress Notes (Signed)
[facility] [address], [place] [address]    Clinic Day:  08/09/2023  Referring physician: [name], DO  Patient Care Team: [name], DO as PCP - General (Family Medicine) [name] [name], MD as PCP - Cardiology (Cardiology) [name], MD as Medical Oncologist (Medical Oncology) [name], RN as Oncology Nurse Navigator (Medical Oncology)   ASSESSMENT & PLAN:   Assessment: 1.  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  Continues along the superior margin of the right kidney. - CTAP on 05/07/2023: Interval increase in size of the right adrenal gland measuring 7.6 cm, previously 4.9 cm.  A large area of decreased attenuation noted likely related to resolving hematoma.  No active hemorrhage seen. - CTAP (05/20/2023): Further enlargement of right adrenal gland measuring 8.9 x 6.7 cm, interim finding of moderate slightly loculated fluid surrounding the superior pole of the right kidney. - CT chest angiogram (05/20/2023): Solid left hilar mass measuring 5.3 x 3.9 cm with partial encasement of left-sided pulmonary vessels.  Mild narrowing of the left lower lobe bronchi but no occlusion.  Irregular focus in the left lower lobe measuring 16 x 8 mm. - Guardant360: BRIP1, PTEN deletion,STK11,KRAS G12A, KRAS amplification - NGS: PD-L1 (22 C3) TPS score 75%.  K-ras G 12A, STK 11. - Cycle 1 of carboplatin, pemetrexed, nivolumab and ipilimumab (Checkmate 9 LA) started on 07/14/2023   2.  Social/family history: - Lives with husband at home.  Independent of ADLs and IADLs.  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  Quit cigarette smoking in 2010 and smoked half pack per day for 15 years. - No family history of bleeding.  Mother had 3 miscarriages and 5 levels.  No family history of antiphospholipid syndrome.  Father and paternal grandfather had cancer.  Maternal first cousin had blood cancer.    Plan:  1.  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  No GI side effects noted.  However she was admitted to the hospital with pneumonia and breathing difficulty. - She reported that she has not been requiring pain medication or nausea medication since the treatment. - We have discussed goals of care again.  She does not want hospice at this time.  She will continue active therapy for her cancer. - We reviewed labs today: Normal LFTs.  Albumin is slightly better at 2.5 today.  CBC shows elevated white count and platelet count.  Hemoglobin improved to 9.8. - She may proceed with cycle 2 on Wednesday.  RTC 3 weeks for follow-up.  2.  Abdominal/right back pain: - She has not required oxycodone since 07/22/2023.  Pain has improved after cycle 1.  3.  Nausea/vomiting: - Nausea has improved since cycle 1 of chemotherapy.  Continue Zofran/Compazine as needed.  4.  Protein malnutrition: - She has increased her protein intake since discharge from the hospital on Saturday.  She is been drinking 3 protein shakes per day.  She has consumed 1800 cal yesterday.  Albumin has improved to 2.5. - Continue Remeron for appetite.  She is not taking Megace.  5.  Leg swellings: - Likely from hypoalbuminemia.  Lasix 40 milligrams will be continued as needed.       No orders of the defined types were placed in this encounter.     I,Katie Daubenspeck,acting as a Neurosurgeon for [name], MD.,have documented all relevant documentation on the behalf of [name], MD,as directed by  [name], MD while in the presence of  [name], MD.   I, [name] MD, have reviewed the above documentation for accuracy and completeness, and I agree with the above.   [name], MD   10/7/20245:19 PM  CHIEF COMPLAINT:   Diagnosis: Metastatic lung cancer to the adrenal gland    Cancer Staging  Metastatic primary lung cancer, left [facility]) Staging form: Lung, AJCC 8th Edition - Clinical stage from 07/01/2023: Stage IVB (cT3, cN0, pM1c) - Signed by [name], MD on 07/01/2023    Prior Therapy: none  Current Therapy:  Carboplatin, pemetrexed, nivolumab and ipilimumab    HISTORY OF PRESENT ILLNESS:   Oncology History  Metastatic primary lung cancer, left (HCC)  06/23/2023 Initial Diagnosis   Metastatic primary lung cancer, left (HCC)   07/01/2023 Cancer Staging   Staging form: Lung, AJCC 8th Edition - Clinical stage from 07/01/2023: Stage IVB (cT3, cN0, pM1c) - Signed by [name], MD on 07/01/2023 Histopathologic type: Adenocarcinoma, NOS   07/14/2023 -  Chemotherapy   Patient is on Treatment Plan : LUNG NSCLC NON-SQUAMOUS Nivolumab + Ipilimumab + Carboplatin + Pemetrexed q42d X 1 cycle / Maintenance Nivolumab + Ipilimumab q42d        INTERVAL HISTORY:   Miranda Perkins is a 62 y.o. female presenting to clinic today for follow up of Metastatic lung cancer to the adrenal gland. She was last seen by NP Victorino Dike on 07/22/23.  Since her last visit, she was admitted on 07/31/23 and treated for pneumonia and upper respiratory symptoms.  Today, she states that she is doing well overall. Her appetite level is at 10%. Her energy level is at 10%.  PAST MEDICAL HISTORY:   Past Medical History: Past Medical History:  Diagnosis Date   Adrenal hemorrhage (HCC)    Anemia    Arthritis    Cancer (HCC)    skin cancer - basal   Dyspnea    Edema of both lower extremities    Endometrial polyp    Hirsutism    History of 2019 novel coronavirus disease (COVID-19) 12/21/2020    positive home result documented in pcp note in epic 12-23-2020,  residual doe, pulmonology consult w/ dr wert note in epic 03-20-2021   History of basal cell carcinoma (BCC) excision 2016   forehead, per pt no recurrence   History of CVA (cerebrovascular accident) without residual deficits 1993   pt stated , while on Chloramphenicol, no residuaL; [medication given to treat tick bite] ; patient states  "i stroked out right next to my doctor , he said i passed out and that was my only symptom" ;  no resiudal   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 branch,  work-up results in epic 03/ 2022 (normal nuclear stress test, normal echo, no arrhythmia's per event monitor);   (05-28-2021 per pt no symptoms since 03/ 2022)   Multiple thyroid nodules    endocrinologist--- dr g. nida,  hx benign bx, clinically euthyroid  Osteopenia 12/2017   T score -1.2 FRAX 4.9% / 0.3%   PMB (postmenopausal bleeding)    Pneumonia    x 2   Post-COVID chronic dyspnea    Pre-diabetes    Rosacea    Stroke (HCC)    Thickened endometrium     Surgical History: Past Surgical History:  Procedure Laterality Date   ANTERIOR AND POSTERIOR REPAIR N/A 08/21/2020   Procedure: ANTERIOR (CYSTOCELE) AND POSTERIOR REPAIR (RECTOCELE);  Surgeon: Genia Del, MD;  Location: [facility];  Service: Gynecology;  Laterality: N/A;  requesting 9:00am OR time  requests one hour   ANTERIOR CERVICAL DECOMP/DISCECTOMY FUSION N/A 09/27/2017   Procedure: ANTERIOR CERVICAL DECOMPRESSION/DISCECTOMY FUSION CERVICAL FOUR-FIVE ,CERVICAL FIVE-SIX,CERVICAL SIX-SEVEN;  Surgeon: Donalee Citrin, MD;  Location: [facility] OR;  Service: Neurosurgery;  Laterality: N/A;   AXILLARY LYMPH NODE BIOPSY Right 06/24/2023   Procedure: AXILLARY LYMPH NODE BIOPSY;  Surgeon: Franky Macho, MD;  Location:  [facility];  Service: General;  Laterality: Right;   BRONCHIAL BIOPSY  06/14/2023   Procedure: BRONCHIAL BIOPSIES;  Surgeon: Leslye Peer, MD;  Location: [facility] ENDOSCOPY;  Service: Pulmonary;;   BRONCHIAL BRUSHINGS  06/14/2023   Procedure: BRONCHIAL BRUSHINGS;  Surgeon: Leslye Peer, MD;  Location: [facility] ENDOSCOPY;  Service: Pulmonary;;   BRONCHIAL NEEDLE ASPIRATION BIOPSY  06/14/2023   Procedure: BRONCHIAL NEEDLE ASPIRATION BIOPSIES;  Surgeon: Leslye Peer, MD;  Location: [facility] ENDOSCOPY;  Service: Pulmonary;;   CARPAL TUNNEL RELEASE Left 09/27/2017   Procedure: CARPAL TUNNEL RELEASE;  Surgeon: Donalee Citrin, MD;  Location: [facility] OR;  Service: Neurosurgery;  Laterality: Left;   DILATATION & CURETTAGE/HYSTEROSCOPY WITH MYOSURE N/A 06/03/2021   Procedure: DILATATION & CURETTAGE/HYSTEROSCOPY WITH MYOSURE;  Surgeon: Genia Del, MD;  Location: [facility];  Service: Gynecology;  Laterality: N/A;   FOOT SURGERY     bone removed from pinky toe   HEMOSTASIS CONTROL  06/14/2023   Procedure: HEMOSTASIS CONTROL;  Surgeon: Leslye Peer, MD;  Location: [facility] ENDOSCOPY;  Service: Pulmonary;;   INGUINAL HERNIA REPAIR Right 1995   KNEE ARTHROSCOPY W/ MENISCAL REPAIR Left 06/21/2017   dr Netta Corrigan   LEG SURGERY  1972   MOHS SURGERY  2016   [facility]   MOUTH SURGERY  2015   removal and cauterization pallete of cancerous lesion   PORTACATH PLACEMENT N/A 06/24/2023   Procedure: INSERTION PORT-A-CATH;  Surgeon: Franky Macho, MD;  Location: [facility];  Service: General;  Laterality: N/A;   TONSILLECTOMY  1979   TONSILLECTOMY  1977   TOTAL KNEE ARTHROPLASTY Left 01/26/2018   Procedure: LEFT TOTAL KNEE ARTHROPLASTY;  Surgeon: Ranee Gosselin, MD;  Location: [facility];  Service: Orthopedics;  Laterality: Left;   TUBAL LIGATION     VIDEO BRONCHOSCOPY WITH ENDOBRONCHIAL ULTRASOUND Bilateral 06/14/2023   Procedure: VIDEO BRONCHOSCOPY WITH ENDOBRONCHIAL ULTRASOUND;  Surgeon: Leslye Peer, MD;  Location:  [facility] ENDOSCOPY;  Service: Pulmonary;  Laterality: Bilateral;    Social History: Social History   Socioeconomic History   Marital status: Married    Spouse name: Not on file   Number of children: 3   Years of education: Not on file   Highest education level: 12th grade  Occupational History   Occupation: retired    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, television/film/video for [employer]   Tobacco Use   Smoking status: Former    Current packs/day: 0.00    Average packs/day: 0.5 packs/day for 35.0 years (17.5 ttl pk-yrs)    Types: Cigarettes    Start  date: 11/26/1974    Quit date: 11/26/2009    Years since quitting: 13.7   Smokeless tobacco: Never  Vaping Use   Vaping status: Never Used  Substance and Sexual Activity   Alcohol use: Not Currently   Drug use: No   Sexual activity: Not Currently    Partners: Male    Birth control/protection: Post-menopausal    Comment: 1st intercourse 77 yo-5 partners, married- 25 yrs  Other Topics Concern   Not on file  Social History Narrative   Lives with husband. Raising 2 grandchildren of deceased daughter.       Lives in one story ranch house.    Social Determinants of Health   Financial Resource Strain: Low Risk  (06/09/2023)   Overall Financial Resource Strain (CARDIA)    Difficulty of Paying Living Expenses: Not hard at all  Food Insecurity: No Food Insecurity (08/01/2023)   Hunger Vital Sign    Worried About Running Out of Food in the Last Year: Never true    Ran Out of Food in the Last Year: Never true  Transportation Needs: No Transportation Needs (08/01/2023)   PRAPARE - Administrator, Civil Service (Medical): No    Lack of Transportation (Non-Medical): No  Physical Activity: Inactive (06/09/2023)   Exercise Vital Sign    Days of Exercise per Week: 0 days    Minutes of Exercise per Session: 90 min  Stress: Stress Concern Present (06/09/2023)   [employer] of Occupational Health - Occupational Stress Questionnaire    Feeling of Stress : To  some extent  Social Connections: Unknown (06/09/2023)   Social Connection and Isolation Panel [NHANES]    Frequency of Communication with Friends and Family: More than three times a week    Frequency of Social Gatherings with Friends and Family: More than three times a week    Attends Religious Services: Patient declined    Database administrator or Organizations: No    Attends Banker Meetings: Never    Marital Status: Married  Recent Concern: Social Connections - Moderately Isolated (04/27/2023)   Social Connection and Isolation Panel [NHANES]    Frequency of Communication with Friends and Family: More than three times a week    Frequency of Social Gatherings with Friends and Family: Never    Attends Religious Services: Never    Database administrator or Organizations: No    Attends Banker Meetings: Never    Marital Status: Married  Catering manager Violence: Not At Risk (08/01/2023)   Humiliation, Afraid, Rape, and Kick questionnaire    Fear of Current or Ex-Partner: No    Emotionally Abused: No    Physically Abused: No    Sexually Abused: No    Family History: Family History  Problem Relation Age of Onset   Diabetes Mother    Heart Problems Mother    Rheum arthritis Mother    COPD Mother    Polycystic kidney disease Mother    Carpal tunnel syndrome Mother    Cancer Father        Liver Cancer   Liver cancer Father    Carpal tunnel syndrome Sister    Thyroid disease Sister    Rheum arthritis Sister    Lung disease Sister    Diabetes Sister    Carpal tunnel syndrome Sister    Clotting disorder Sister    Rheum arthritis Sister    Thyroid disease Sister    COPD Sister  Heart Problems Sister    Gout Sister    Cancer Brother    Heart Problems Maternal Grandmother    Cancer Maternal Grandfather        Lung Cancer   Heart Problems Maternal Grandfather    Heart Problems Paternal Grandmother    Cancer Paternal Grandfather        Brain & Skin  cancer   Polycystic ovary syndrome Daughter    Rheum arthritis Maternal Aunt    Fibromyalgia Maternal Aunt    Polycystic kidney disease Maternal Aunt    Heart Problems Maternal Aunt    Anemia Maternal Aunt    Adrenal disorder Maternal Aunt    Carpal tunnel syndrome Maternal Aunt    Diabetes Maternal Aunt    Thyroid disease Maternal Aunt    Rheum arthritis Maternal Aunt    Diabetes Maternal Aunt    Diabetes Maternal Aunt    Rheum arthritis Maternal Aunt    Heart Problems Maternal Aunt    Rheum arthritis Maternal Uncle    Heart Problems Maternal Uncle     Current Medications:  Current Outpatient Medications:    acetaminophen (TYLENOL) 325 MG tablet, Take 2 tablets (650 mg total) by mouth every 6 (six) hours as needed for mild pain (or Fever >/= 101)., Disp: , Rfl:    albuterol (PROVENTIL) (2.5 MG/3ML) 0.083% nebulizer solution, Take 3 mLs (2.5 mg total) by nebulization every 6 (six) hours as needed for wheezing or shortness of breath., Disp: 150 mL, Rfl: 1   feeding supplement (ENSURE ENLIVE / ENSURE PLUS) LIQD, Take 237 mLs by mouth 3 (three) times daily between meals., Disp: , Rfl:    folic acid (FOLVITE) 1 MG tablet, Take 1 tablet (1 mg total) by mouth daily., Disp: 90 tablet, Rfl: 3   furosemide (LASIX) 40 MG tablet, Take 1 tablet (40 mg total) by mouth daily., Disp: 90 tablet, Rfl: 3   guaiFENesin (MUCINEX) 600 MG 12 hr tablet, Take 2 tablets (1,200 mg total) by mouth 2 (two) times daily., Disp: 20 tablet, Rfl: 0   HYDROcodone bit-homatropine (HYCODAN) 5-1.5 MG/5ML syrup, Take 5 mLs by mouth every 6 (six) hours as needed (refractory cough)., Disp: 120 mL, Rfl: 0   iron polysaccharides (NIFEREX) 150 MG capsule, Take 1 capsule (150 mg total) by mouth daily., Disp: 30 capsule, Rfl: 3   megestrol (MEGACE) 40 MG/ML suspension, Take by mouth., Disp: , Rfl:    metoprolol tartrate (LOPRESSOR) 25 MG tablet, Take 0.5 tablets (12.5 mg total) by mouth 2 (two) times daily., Disp: 30 tablet,  Rfl: 1   midodrine (PROAMATINE) 10 MG tablet, Take 1 tablet (10 mg total) by mouth 2 (two) times daily with a meal. For soft BP, Disp: 60 tablet, Rfl: 1   mirtazapine (REMERON) 15 MG tablet, Take 1 tablet (15 mg total) by mouth at bedtime., Disp: 30 tablet, Rfl: 2   Multiple Vitamin (MULTIVITAMIN WITH MINERALS) TABS tablet, Take 1 tablet by mouth daily., Disp: 100 tablet, Rfl: 1   oxyCODONE (OXY IR/ROXICODONE) 5 MG immediate release tablet, Take 1 tablet (5 mg total) by mouth every 4 (four) hours as needed for moderate pain., Disp: 180 tablet, Rfl: 0   Potassium Chloride ER 20 MEQ TBCR, Take 1 tablet (20 mEq total) by mouth daily. 1 tab daily by mouth--- take while taking Lasix/furosemide, Disp: 30 tablet, Rfl: 1   prochlorperazine (COMPAZINE) 10 MG tablet, Take 1 tablet (10 mg total) by mouth every 6 (six) hours as needed for nausea or vomiting.,  Disp: 60 tablet, Rfl: 2   saccharomyces boulardii (FLORASTOR) 250 MG capsule, Take 1 capsule (250 mg total) by mouth 2 (two) times daily., Disp: 60 capsule, Rfl: 0   vancomycin (VANCOCIN) 125 MG capsule, Take 1 capsule (125 mg total) by mouth 2 (two) times daily for 5 days. For C diff Prophylaxis, Disp: 10 capsule, Rfl: 0  Current Facility-Administered Medications:    sodium chloride flush (NS) 0.9 % injection 10 mL, 10 mL, Intravenous, PRN, [name], MD, 10 mL at 08/09/23 0931   Allergies: Allergies  Allergen Reactions   Influenza Vaccines Anaphylaxis and Other (See Comments)    Per patient   Penicillins Anaphylaxis and Other (See Comments)   Articaine Other (See Comments)    Caused infection   Cortisone Other (See Comments)    Turned red and ran a low grade fever for 3 days   Other Rash    bandaids- skin turns red and a rash     REVIEW OF SYSTEMS:   Review of Systems  Constitutional:  Negative for chills, fatigue and fever.  HENT:   Negative for lump/mass, mouth sores, nosebleeds, sore throat and trouble swallowing.   Eyes:   Negative for eye problems.  Respiratory:  Positive for cough and shortness of breath.   Cardiovascular:  Negative for chest pain, leg swelling and palpitations.  Gastrointestinal:  Negative for abdominal pain, constipation, diarrhea, nausea and vomiting.  Genitourinary:  Negative for bladder incontinence, difficulty urinating, dysuria, frequency, hematuria and nocturia.   Musculoskeletal:  Negative for arthralgias, back pain, flank pain, myalgias and neck pain.  Skin:  Negative for itching and rash.  Neurological:  Negative for dizziness, headaches and numbness.  Hematological:  Does not bruise/bleed easily.  Psychiatric/Behavioral:  Negative for depression, sleep disturbance and suicidal ideas. The patient is not nervous/anxious.   All other systems reviewed and are negative.    VITALS:   Blood pressure 97/66, pulse (!) 123, temperature 98.9 F (37.2 C), temperature source Oral, resp. rate 20, SpO2 95%.  Wt Readings from Last 3 Encounters:  08/07/23 211 lb 3.2 oz (95.8 kg)  07/20/23 233 lb (105.7 kg)  07/14/23 243 lb 14.4 oz (110.6 kg)    There is no height or weight on file to calculate BMI.  Performance status (ECOG): 2 - Symptomatic, <50% confined to bed  PHYSICAL EXAM:   Physical Exam Vitals and nursing note reviewed. Exam conducted with a chaperone present.  Constitutional:      Appearance: Normal appearance.  Cardiovascular:     Rate and Rhythm: Normal rate and regular rhythm.     Pulses: Normal pulses.     Heart sounds: Normal heart sounds.  Pulmonary:     Effort: Pulmonary effort is normal.     Breath sounds: Normal breath sounds.  Abdominal:     Palpations: Abdomen is soft. There is no hepatomegaly, splenomegaly or mass.     Tenderness: There is no abdominal tenderness.  Musculoskeletal:     Right lower leg: Edema present.     Left lower leg: Edema present.  Lymphadenopathy:     Cervical: No cervical adenopathy.     Right cervical: No superficial, deep or  posterior cervical adenopathy.    Left cervical: No superficial, deep or posterior cervical adenopathy.     Upper Body:     Right upper body: No supraclavicular or axillary adenopathy.     Left upper body: No supraclavicular or axillary adenopathy.  Neurological:     General: No focal deficit  present.     Mental Status: She is alert and oriented to person, place, and time.  Psychiatric:        Mood and Affect: Mood normal.        Behavior: Behavior normal.     LABS:      Latest Ref Rng & Units 08/09/2023    8:49 AM 08/07/2023    6:21 AM 08/06/2023    3:34 AM  CBC  WBC 4.0 - 10.5 K/uL 20.1  12.7  12.9   Hemoglobin 12.0 - 15.0 g/dL 9.8  8.5  8.0   Hematocrit 36.0 - 46.0 % 32.4  28.8  26.3   Platelets 150 - 400 K/uL 731  607  566       Latest Ref Rng & Units 08/09/2023    8:49 AM 08/07/2023    6:21 AM 08/06/2023    3:34 AM  CMP  Glucose 70 - 99 mg/dL 811  92  914   BUN 8 - 23 mg/dL 15  11  9    Creatinine 0.44 - 1.00 mg/dL 7.82  9.56  2.13   Sodium 135 - 145 mmol/L 131  134  134   Potassium 3.5 - 5.1 mmol/L 3.3  3.2  3.2   Chloride 98 - 111 mmol/L 94  97  99   CO2 22 - 32 mmol/L 26  28  25    Calcium 8.9 - 10.3 mg/dL 8.3  8.0  7.8   Total Protein 6.5 - 8.1 g/dL 6.5     Total Bilirubin 0.3 - 1.2 mg/dL 0.7     Alkaline Phos 38 - 126 U/L 79     AST 15 - 41 U/L 23     ALT 0 - 44 U/L 20        No results found for: "CEA1", "CEA" / No results found for: "CEA1", "CEA" No results found for: "PSA1" No results found for: "YQM578" No results found for: "CAN125"  No results found for: "TOTALPROTELP", "ALBUMINELP", "A1GS", "A2GS", "BETS", "BETA2SER", "GAMS", "MSPIKE", "SPEI" Lab Results  Component Value Date   TIBC 108 (L) 08/04/2023   IRONPCTSAT 15 08/04/2023   No results found for: "LDH"   STUDIES:   US Venous Img Lower Bilateral (DVT)  Result Date: 08/05/2023 CLINICAL DATA:  Bilateral lower extremity edema EXAM: BILATERAL LOWER EXTREMITY VENOUS DOPPLER ULTRASOUND  TECHNIQUE: Gray-scale sonography with graded compression, as well as color Doppler and duplex ultrasound were performed to evaluate the lower extremity deep venous systems from the level of the common femoral vein and including the common femoral, femoral, profunda femoral, popliteal and calf veins including the posterior tibial, peroneal and gastrocnemius veins when visible. The superficial great saphenous vein was also interrogated. Spectral Doppler was utilized to evaluate flow at rest and with distal augmentation maneuvers in the common femoral, femoral and popliteal veins. COMPARISON:  None Available. FINDINGS: RIGHT LOWER EXTREMITY Common Femoral Vein: No evidence of thrombus. Normal compressibility, respiratory phasicity and response to augmentation. Saphenofemoral Junction: No evidence of thrombus. Normal compressibility and flow on color Doppler imaging. Profunda Femoral Vein: No evidence of thrombus. Normal compressibility and flow on color Doppler imaging. Femoral Vein: No evidence of thrombus. Normal compressibility, respiratory phasicity and response to augmentation. Popliteal Vein: No evidence of thrombus. Normal compressibility, respiratory phasicity and response to augmentation. Calf Veins: No evidence of thrombus. Normal compressibility and flow on color Doppler imaging. Superficial Great Saphenous Vein: No evidence of thrombus. Normal compressibility. Venous Reflux:  None. Other Findings:  None. LEFT  LOWER EXTREMITY Common Femoral Vein: No evidence of thrombus. Normal compressibility, respiratory phasicity and response to augmentation. Saphenofemoral Junction: No evidence of thrombus. Normal compressibility and flow on color Doppler imaging. Profunda Femoral Vein: No evidence of thrombus. Normal compressibility and flow on color Doppler imaging. Femoral Vein: No evidence of thrombus. Normal compressibility, respiratory phasicity and response to augmentation. Popliteal Vein: No evidence of thrombus.  Normal compressibility, respiratory phasicity and response to augmentation. Calf Veins: No evidence of thrombus. Normal compressibility and flow on color Doppler imaging. Superficial Great Saphenous Vein: No evidence of thrombus. Normal compressibility. Venous Reflux:  None. Other Findings:  None. IMPRESSION: No evidence of deep venous thrombosis in either lower extremity. Electronically Signed   By: Malachy Moan M.D.   On: 08/05/2023 10:10   DG Chest Portable 1 View  Result Date: 07/31/2023 CLINICAL DATA:  Shortness of breath.  Lung cancer. EXAM: PORTABLE CHEST 1 VIEW COMPARISON:  07/06/2023 FINDINGS: Right lung clear. Left parahilar mass again noted. Left base collapse/consolidation is similar to prior with small left pleural effusion. Left Port-A-Cath remains in place. No acute bony abnormality. Telemetry leads overlie the chest. IMPRESSION: No substantial interval change. Persistent left base collapse/consolidation with small left effusion. Electronically Signed   By: Kennith Center M.D.   On: 07/31/2023 16:25

## 2023-08-09 ENCOUNTER — Inpatient Hospital Stay: Attending: Hematology | Admitting: Hematology

## 2023-08-09 ENCOUNTER — Encounter (HOSPITAL_COMMUNITY): Payer: Self-pay | Admitting: Hematology

## 2023-08-09 ENCOUNTER — Telehealth: Payer: Self-pay

## 2023-08-09 DIAGNOSIS — Z5111 Encounter for antineoplastic chemotherapy: Secondary | ICD-10-CM | POA: Insufficient documentation

## 2023-08-09 DIAGNOSIS — Z87891 Personal history of nicotine dependence: Secondary | ICD-10-CM | POA: Diagnosis not present

## 2023-08-09 DIAGNOSIS — C3492 Malignant neoplasm of unspecified part of left bronchus or lung: Secondary | ICD-10-CM | POA: Insufficient documentation

## 2023-08-09 DIAGNOSIS — C3490 Malignant neoplasm of unspecified part of unspecified bronchus or lung: Secondary | ICD-10-CM

## 2023-08-09 DIAGNOSIS — Z79899 Other long term (current) drug therapy: Secondary | ICD-10-CM | POA: Diagnosis not present

## 2023-08-09 DIAGNOSIS — Z5112 Encounter for antineoplastic immunotherapy: Secondary | ICD-10-CM | POA: Insufficient documentation

## 2023-08-09 DIAGNOSIS — C7971 Secondary malignant neoplasm of right adrenal gland: Secondary | ICD-10-CM | POA: Insufficient documentation

## 2023-08-09 DIAGNOSIS — E2740 Unspecified adrenocortical insufficiency: Secondary | ICD-10-CM | POA: Insufficient documentation

## 2023-08-09 LAB — CBC WITH DIFFERENTIAL/PLATELET
Abs Immature Granulocytes: 0.28 10*3/uL — ABNORMAL HIGH (ref 0.00–0.07)
Basophils Absolute: 0 10*3/uL (ref 0.0–0.1)
Basophils Relative: 0 %
Eosinophils Absolute: 0 10*3/uL (ref 0.0–0.5)
Eosinophils Relative: 0 %
HCT: 32.4 % — ABNORMAL LOW (ref 36.0–46.0)
Hemoglobin: 9.8 g/dL — ABNORMAL LOW (ref 12.0–15.0)
Immature Granulocytes: 1 %
Lymphocytes Relative: 8 %
Lymphs Abs: 1.6 10*3/uL (ref 0.7–4.0)
MCH: 25.2 pg — ABNORMAL LOW (ref 26.0–34.0)
MCHC: 30.2 g/dL (ref 30.0–36.0)
MCV: 83.3 fL (ref 80.0–100.0)
Monocytes Absolute: 1.1 10*3/uL — ABNORMAL HIGH (ref 0.1–1.0)
Monocytes Relative: 6 %
Neutro Abs: 17.1 10*3/uL — ABNORMAL HIGH (ref 1.7–7.7)
Neutrophils Relative %: 85 %
Platelets: 731 10*3/uL — ABNORMAL HIGH (ref 150–400)
RBC: 3.89 MIL/uL (ref 3.87–5.11)
RDW: 22.5 % — ABNORMAL HIGH (ref 11.5–15.5)
WBC: 20.1 10*3/uL — ABNORMAL HIGH (ref 4.0–10.5)
nRBC: 0 % (ref 0.0–0.2)

## 2023-08-09 LAB — COMPREHENSIVE METABOLIC PANEL
ALT: 20 U/L (ref 0–44)
AST: 23 U/L (ref 15–41)
Albumin: 2.5 g/dL — ABNORMAL LOW (ref 3.5–5.0)
Alkaline Phosphatase: 79 U/L (ref 38–126)
Anion gap: 11 (ref 5–15)
BUN: 15 mg/dL (ref 8–23)
CO2: 26 mmol/L (ref 22–32)
Calcium: 8.3 mg/dL — ABNORMAL LOW (ref 8.9–10.3)
Chloride: 94 mmol/L — ABNORMAL LOW (ref 98–111)
Creatinine, Ser: 0.58 mg/dL (ref 0.44–1.00)
GFR, Estimated: >60 mL/min (ref >60)
Glucose, Bld: 136 mg/dL — ABNORMAL HIGH (ref 70–99)
Potassium: 3.3 mmol/L — ABNORMAL LOW (ref 3.5–5.1)
Sodium: 131 mmol/L — ABNORMAL LOW (ref 135–145)
Total Bilirubin: 0.7 mg/dL (ref 0.3–1.2)
Total Protein: 6.5 g/dL (ref 6.5–8.1)

## 2023-08-09 LAB — MAGNESIUM: Magnesium: 1.8 mg/dL (ref 1.7–2.4)

## 2023-08-09 MED ORDER — SODIUM CHLORIDE 0.9% FLUSH
10.0000 mL | INTRAVENOUS | Status: DC | PRN
  Administered 2023-08-09: 10 mL via INTRAVENOUS

## 2023-08-09 MED ORDER — HEPARIN SOD (PORK) LOCK FLUSH 100 UNIT/ML IV SOLN
500.0000 [IU] | Freq: Once | INTRAVENOUS | Status: AC
  Administered 2023-08-09: 500 [IU] via INTRAVENOUS

## 2023-08-09 NOTE — Progress Notes (Signed)
No treatment today due to drug unavailability   Vitals stable and discharged home from clinic via wheelchair. Follow up as scheduled. [name]

## 2023-08-09 NOTE — Patient Instructions (Addendum)
[facility] at [facility] Discharge Instructions   You were seen and examined today by Dr. Katragadda.     Thank you for choosing [facility] at [facility] to provide your oncology and hematology care.  To afford each patient quality time with our provider, please arrive at least 15 minutes before your scheduled appointment time.   If you have a lab appointment with the Cancer Center please come in thru the Main Entrance and check in at the main information desk.  You need to re-schedule your appointment should you arrive 10 or more minutes late.  We strive to give you quality time with our providers, and arriving late affects you and other patients whose appointments are after yours.  Also, if you no show three or more times for appointments you may be dismissed from the clinic at the providers discretion.     Again, thank you for choosing [facility].  Our hope is that these requests will decrease the amount of time that you wait before being seen by our physicians.       _____________________________________________________________  Should you have questions after your visit to [facility], please contact our office at [phone] and follow the prompts.  Our office hours are 8:00 a.m. and 4:30 p.m. Monday - Friday.  Please note that voicemails left after 4:00 p.m. may not be returned until the following business day.  We are closed weekends and major holidays.  You do have access to a nurse 24-7, just call the main number to the clinic [phone] and do not press any options, hold on the line and a nurse will answer the phone.    For prescription refill requests, have your pharmacy contact our office and allow 72 hours.    Due to Covid, you will need to wear a mask upon entering the hospital. If you do not have a mask, a mask will be given to you at the Main Entrance upon arrival. For doctor visits, patients may  have 1 support person age 18 or older with them. For treatment visits, patients can not have anyone with them due to social distancing guidelines and our immunocompromised population.      

## 2023-08-09 NOTE — Transitions of Care (Post Inpatient/ED Visit) (Signed)
08/09/2023  Name: NOMI MACHOWSKI MRN: [id number] DOB: July 23, 1961  Today's TOC FU Call Status: Today's TOC FU Call Status:: Unsuccessful Call (1st Attempt) Unsuccessful Call (1st Attempt) Date: 08/09/23  Attempted to reach the patient regarding the most recent Inpatient/ED visit.  Follow Up Plan: Additional outreach attempts will be made to reach the patient to complete the Transitions of Care (Post Inpatient/ED visit) call.   Jodelle Gross RN, BSN, CCM RN Care Manager  Transitions of Care  [facility] - [facility] [place]  [phone]

## 2023-08-10 ENCOUNTER — Telehealth: Payer: Self-pay

## 2023-08-10 NOTE — Transitions of Care (Post Inpatient/ED Visit) (Signed)
08/10/2023  Name: KRISTEN FRANGELLA MRN: [id number] DOB: 27-Jan-1961  Today's TOC FU Call Status: Today's TOC FU Call Status:: Unsuccessful Call (2nd Attempt) Unsuccessful Call (2nd Attempt) Date: 08/10/23  Attempted to reach the patient regarding the most recent Inpatient/ED visit.  Follow Up Plan: Additional outreach attempts will be made to reach the patient to complete the Transitions of Care (Post Inpatient/ED visit) call.   Jodelle Gross RN, BSN, CCM RN Care Manager  Transitions of Care  [facility] - [facility]  [phone]

## 2023-08-11 ENCOUNTER — Inpatient Hospital Stay

## 2023-08-11 ENCOUNTER — Telehealth: Payer: Self-pay

## 2023-08-11 VITALS — BP 102/50 | HR 110 | Temp 97.0°F | Resp 20

## 2023-08-11 DIAGNOSIS — M533 Sacrococcygeal disorders, not elsewhere classified: Secondary | ICD-10-CM

## 2023-08-11 DIAGNOSIS — E876 Hypokalemia: Secondary | ICD-10-CM

## 2023-08-11 DIAGNOSIS — C3492 Malignant neoplasm of unspecified part of left bronchus or lung: Secondary | ICD-10-CM

## 2023-08-11 DIAGNOSIS — Z5112 Encounter for antineoplastic immunotherapy: Secondary | ICD-10-CM | POA: Diagnosis not present

## 2023-08-11 MED ORDER — SODIUM CHLORIDE 0.9 % IV SOLN
400.0000 mg/m2 | Freq: Once | INTRAVENOUS | Status: AC
  Administered 2023-08-11: 900 mg via INTRAVENOUS
  Filled 2023-08-11: qty 20

## 2023-08-11 MED ORDER — SODIUM CHLORIDE 0.9 % IV SOLN: Freq: Once | INTRAVENOUS | Status: AC

## 2023-08-11 MED ORDER — HEPARIN SOD (PORK) LOCK FLUSH 100 UNIT/ML IV SOLN
500.0000 [IU] | Freq: Once | INTRAVENOUS | Status: AC | PRN
  Administered 2023-08-11: 500 [IU]

## 2023-08-11 MED ORDER — SODIUM CHLORIDE 0.9% FLUSH
10.0000 mL | INTRAVENOUS | Status: DC | PRN
  Administered 2023-08-11: 10 mL

## 2023-08-11 MED ORDER — POTASSIUM CHLORIDE CRYS ER 20 MEQ PO TBCR
40.0000 meq | EXTENDED_RELEASE_TABLET | Freq: Once | ORAL | Status: AC
  Administered 2023-08-11: 40 meq via ORAL
  Filled 2023-08-11: qty 2

## 2023-08-11 MED ORDER — SODIUM CHLORIDE 0.9 % IV SOLN
360.0000 mg | Freq: Once | INTRAVENOUS | Status: AC
  Administered 2023-08-11: 360 mg via INTRAVENOUS
  Filled 2023-08-11: qty 24

## 2023-08-11 MED ORDER — PALONOSETRON HCL INJECTION 0.25 MG/5ML
0.2500 mg | Freq: Once | INTRAVENOUS | Status: AC
  Administered 2023-08-11: 0.25 mg via INTRAVENOUS
  Filled 2023-08-11: qty 5

## 2023-08-11 MED ORDER — SODIUM CHLORIDE 0.9 % IV SOLN
580.0000 mg | Freq: Once | INTRAVENOUS | Status: AC
  Administered 2023-08-11: 580 mg via INTRAVENOUS
  Filled 2023-08-11: qty 58

## 2023-08-11 MED ORDER — SODIUM CHLORIDE 0.9 % IV SOLN
10.0000 mg | Freq: Once | INTRAVENOUS | Status: AC
  Administered 2023-08-11: 10 mg via INTRAVENOUS
  Filled 2023-08-11: qty 10

## 2023-08-11 MED ORDER — OXYCODONE HCL 5 MG PO TABS
5.0000 mg | ORAL_TABLET | Freq: Once | ORAL | Status: AC
  Administered 2023-08-11: 5 mg via ORAL
  Filled 2023-08-11: qty 1

## 2023-08-11 MED ORDER — SODIUM CHLORIDE 0.9 % IV SOLN
150.0000 mg | Freq: Once | INTRAVENOUS | Status: AC
  Administered 2023-08-11: 150 mg via INTRAVENOUS
  Filled 2023-08-11: qty 150

## 2023-08-11 NOTE — Progress Notes (Signed)
Continue Alimta 400mg /m2 and Carbo AUC 4 per MD.  Richardean Sale, RPH, BCPS, BCOP 08/11/2023 11:48 AM

## 2023-08-11 NOTE — Patient Instructions (Signed)
[facility]  Discharge Instructions: Thank you for choosing [facility] to provide your oncology and hematology care.  If you have a lab appointment with the Cancer Center - please note that after April 8th, 2024, all labs will be drawn in the cancer center.  You do not have to check in or register with the main entrance as you have in the past but will complete your check-in in the cancer center.  Wear comfortable clothing and clothing appropriate for easy access to any Portacath or PICC line.   We strive to give you quality time with your provider. You may need to reschedule your appointment if you arrive late (15 or more minutes).  Arriving late affects you and other patients whose appointments are after yours.  Also, if you miss three or more appointments without notifying the office, you may be dismissed from the clinic at the provider's discretion.      For prescription refill requests, have your pharmacy contact our office and allow 72 hours for refills to be completed.    Today you received the following chemotherapy and/or immunotherapy agents Opdivo/Alimta/Carboplatin.  Nivolumab Injection What is this medication? NIVOLUMAB (nye VOL ue mab) treats some types of cancer. It works by helping your immune system slow or stop the spread of cancer cells. It is a monoclonal antibody. This medicine may be used for other purposes; ask your health care provider or pharmacist if you have questions. COMMON BRAND NAME(S): Opdivo What should I tell my care team before I take this medication? They need to know if you have any of these conditions: Allogeneic stem cell transplant (uses someone else's stem cells) Autoimmune diseases, such as Crohn disease, ulcerative colitis, lupus History of chest radiation Nervous system problems, such as Guillain-Barre syndrome or myasthenia gravis Organ transplant An unusual or allergic reaction to nivolumab, other medications,  foods, dyes, or preservatives Pregnant or trying to get pregnant Breast-feeding How should I use this medication? This medication is infused into a vein. It is given in a hospital or clinic setting. A special MedGuide will be given to you before each treatment. Be sure to read this information carefully each time. Talk to your care team about the use of this medication in children. While it may be prescribed for children as young as 12 years for selected conditions, precautions do apply. Overdosage: If you think you have taken too much of this medicine contact a poison control center or emergency room at once. NOTE: This medicine is only for you. Do not share this medicine with others. What if I miss a dose? Keep appointments for follow-up doses. It is important not to miss your dose. Call your care team if you are unable to keep an appointment. What may interact with this medication? Interactions have not been studied. This list may not describe all possible interactions. Give your health care provider a list of all the medicines, herbs, non-prescription drugs, or dietary supplements you use. Also tell them if you smoke, drink alcohol, or use illegal drugs. Some items may interact with your medicine. What should I watch for while using this medication? Your condition will be monitored carefully while you are receiving this medication. You may need blood work while taking this medication. This medication may cause serious skin reactions. They can happen weeks to months after starting the medication. Contact your care team right away if you notice fevers or flu-like symptoms with a rash. The rash may be red  or purple and then turn into blisters or peeling of the skin. You may also notice a red rash with swelling of the face, lips, or lymph nodes in your neck or under your arms. Tell your care team right away if you have any change in your eyesight. Talk to your care team if you are pregnant or think  you might be pregnant. A negative pregnancy test is required before starting this medication. A reliable form of contraception is recommended while taking this medication and for 5 months after the last dose. Talk to your care team about effective forms of contraception. Do not breast-feed while taking this medication and for 5 months after the last dose. What side effects may I notice from receiving this medication? Side effects that you should report to your care team as soon as possible: Allergic reactions--skin rash, itching, hives, swelling of the face, lips, tongue, or throat Dry cough, shortness of breath or trouble breathing Eye pain, redness, irritation, or discharge with blurry or decreased vision Heart muscle inflammation--unusual weakness or fatigue, shortness of breath, chest pain, fast or irregular heartbeat, dizziness, swelling of the ankles, feet, or hands Hormone gland problems--headache, sensitivity to light, unusual weakness or fatigue, dizziness, fast or irregular heartbeat, increased sensitivity to cold or heat, excessive sweating, constipation, hair loss, increased thirst or amount of urine, tremors or shaking, irritability Infusion reactions--chest pain, shortness of breath or trouble breathing, feeling faint or lightheaded Kidney injury (glomerulonephritis)--decrease in the amount of urine, red or dark Ibraham Levi urine, foamy or bubbly urine, swelling of the ankles, hands, or feet Liver injury--right upper belly pain, loss of appetite, nausea, light-colored stool, dark yellow or Kashae Carstens urine, yellowing skin or eyes, unusual weakness or fatigue Pain, tingling, or numbness in the hands or feet, muscle weakness, change in vision, confusion or trouble speaking, loss of balance or coordination, trouble walking, seizures Rash, fever, and swollen lymph nodes Redness, blistering, peeling, or loosening of the skin, including inside the mouth Sudden or severe stomach pain, bloody diarrhea,  fever, nausea, vomiting Side effects that usually do not require medical attention (report these to your care team if they continue or are bothersome): Bone, joint, or muscle pain Diarrhea Fatigue Loss of appetite Nausea Skin rash This list may not describe all possible side effects. Call your doctor for medical advice about side effects. You may report side effects to FDA at 1-800-FDA-1088. Where should I keep my medication? This medication is given in a hospital or clinic. It will not be stored at home. NOTE: This sheet is a summary. It may not cover all possible information. If you have questions about this medicine, talk to your doctor, pharmacist, or health care provider.  2024 Elsevier/Gold Standard (2022-02-16 00:00:00)    Pemetrexed Injection What is this medication? PEMETREXED (PEM e TREX ed) treats some types of cancer. It works by slowing down the growth of cancer cells. This medicine may be used for other purposes; ask your health care provider or pharmacist if you have questions. COMMON BRAND NAME(S): Alimta, PEMFEXY, PEMRYDI RTU What should I tell my care team before I take this medication? They need to know if you have any of these conditions: Infection, such as chickenpox, cold sores, or herpes Kidney disease Low blood cell levels (white cells, red cells, and platelets) Lung or breathing disease, such as asthma Radiation therapy An unusual or allergic reaction to pemetrexed, other medications, foods, dyes, or preservatives If you or your partner are pregnant or trying to get pregnant  Breast-feeding How should I use this medication? This medication is injected into a vein. It is given by your care team in a hospital or clinic setting. Talk to your care team about the use of this medication in children. Special care may be needed. Overdosage: If you think you have taken too much of this medicine contact a poison control center or emergency room at once. NOTE: This  medicine is only for you. Do not share this medicine with others. What if I miss a dose? Keep appointments for follow-up doses. It is important not to miss your dose. Call your care team if you are unable to keep an appointment. What may interact with this medication? Do not take this medication with any of the following: Live virus vaccines This medication may also interact with the following: Ibuprofen This list may not describe all possible interactions. Give your health care provider a list of all the medicines, herbs, non-prescription drugs, or dietary supplements you use. Also tell them if you smoke, drink alcohol, or use illegal drugs. Some items may interact with your medicine. What should I watch for while using this medication? Your condition will be monitored carefully while you are receiving this medication. This medication may make you feel generally unwell. This is not uncommon as chemotherapy can affect healthy cells as well as cancer cells. Report any side effects. Continue your course of treatment even though you feel ill unless your care team tells you to stop. This medication can cause serious side effects. To reduce the risk, your care team may give you other medications to take before receiving this one. Be sure to follow the directions from your care team. This medication can cause a rash or redness in areas of the body that have previously had radiation therapy. If you have had radiation therapy, tell your care team if you notice a rash in this area. This medication may increase your risk of getting an infection. Call your care team for advice if you get a fever, chills, sore throat, or other symptoms of a cold or flu. Do not treat yourself. Try to avoid being around people who are sick. Be careful brushing or flossing your teeth or using a toothpick because you may get an infection or bleed more easily. If you have any dental work done, tell your dentist you are receiving this  medication. Avoid taking medications that contain aspirin, acetaminophen, ibuprofen, naproxen, or ketoprofen unless instructed by your care team. These medications may hide a fever. Check with your care team if you have severe diarrhea, nausea, and vomiting, or if you sweat a lot. The loss of too much body fluid may make it dangerous for you to take this medication. Talk to your care team if you or your partner wish to become pregnant or think either of you might be pregnant. This medication can cause serious birth defects if taken during pregnancy and for 6 months after the last dose. A negative pregnancy test is required before starting this medication. A reliable form of contraception is recommended while taking this medication and for 6 months after the last dose. Talk to your care team about reliable forms of contraception. Do not father a child while taking this medication and for 3 months after the last dose. Use a condom while having sex during this time period. Do not breastfeed while taking this medication and for 1 week after the last dose. This medication may cause infertility. Talk to your care team if you  are concerned about your fertility. What side effects may I notice from receiving this medication? Side effects that you should report to your care team as soon as possible: Allergic reactions--skin rash, itching, hives, swelling of the face, lips, tongue, or throat Dry cough, shortness of breath or trouble breathing Infection--fever, chills, cough, sore throat, wounds that don't heal, pain or trouble when passing urine, general feeling of discomfort or being unwell Kidney injury--decrease in the amount of urine, swelling of the ankles, hands, or feet Low red blood cell level--unusual weakness or fatigue, dizziness, headache, trouble breathing Redness, blistering, peeling, or loosening of the skin, including inside the mouth Unusual bruising or bleeding Side effects that usually do not  require medical attention (report to your care team if they continue or are bothersome): Fatigue Loss of appetite Nausea Vomiting This list may not describe all possible side effects. Call your doctor for medical advice about side effects. You may report side effects to FDA at 1-800-FDA-1088. Where should I keep my medication? This medication is given in a hospital or clinic. It will not be stored at home. NOTE: This sheet is a summary. It may not cover all possible information. If you have questions about this medicine, talk to your doctor, pharmacist, or health care provider.  2024 Elsevier/Gold Standard (2022-02-24 00:00:00)    Carboplatin Injection What is this medication? CARBOPLATIN (KAR boe pla tin) treats some types of cancer. It works by slowing down the growth of cancer cells. This medicine may be used for other purposes; ask your health care provider or pharmacist if you have questions. COMMON BRAND NAME(S): Paraplatin What should I tell my care team before I take this medication? They need to know if you have any of these conditions: Blood disorders Hearing problems Kidney disease Recent or ongoing radiation therapy An unusual or allergic reaction to carboplatin, cisplatin, other medications, foods, dyes, or preservatives Pregnant or trying to get pregnant Breast-feeding How should I use this medication? This medication is injected into a vein. It is given by your care team in a hospital or clinic setting. Talk to your care team about the use of this medication in children. Special care may be needed. Overdosage: If you think you have taken too much of this medicine contact a poison control center or emergency room at once. NOTE: This medicine is only for you. Do not share this medicine with others. What if I miss a dose? Keep appointments for follow-up doses. It is important not to miss your dose. Call your care team if you are unable to keep an appointment. What may  interact with this medication? Medications for seizures Some antibiotics, such as amikacin, gentamicin, neomycin, streptomycin, tobramycin Vaccines This list may not describe all possible interactions. Give your health care provider a list of all the medicines, herbs, non-prescription drugs, or dietary supplements you use. Also tell them if you smoke, drink alcohol, or use illegal drugs. Some items may interact with your medicine. What should I watch for while using this medication? Your condition will be monitored carefully while you are receiving this medication. You may need blood work while taking this medication. This medication may make you feel generally unwell. This is not uncommon, as chemotherapy can affect healthy cells as well as cancer cells. Report any side effects. Continue your course of treatment even though you feel ill unless your care team tells you to stop. In some cases, you may be given additional medications to help with side effects. Follow all  directions for their use. This medication may increase your risk of getting an infection. Call your care team for advice if you get a fever, chills, sore throat, or other symptoms of a cold or flu. Do not treat yourself. Try to avoid being around people who are sick. Avoid taking medications that contain aspirin, acetaminophen, ibuprofen, naproxen, or ketoprofen unless instructed by your care team. These medications may hide a fever. Be careful brushing or flossing your teeth or using a toothpick because you may get an infection or bleed more easily. If you have any dental work done, tell your dentist you are receiving this medication. Talk to your care team if you wish to become pregnant or think you might be pregnant. This medication can cause serious birth defects. Talk to your care team about effective forms of contraception. Do not breast-feed while taking this medication. What side effects may I notice from receiving this  medication? Side effects that you should report to your care team as soon as possible: Allergic reactions--skin rash, itching, hives, swelling of the face, lips, tongue, or throat Infection--fever, chills, cough, sore throat, wounds that don't heal, pain or trouble when passing urine, general feeling of discomfort or being unwell Low red blood cell level--unusual weakness or fatigue, dizziness, headache, trouble breathing Pain, tingling, or numbness in the hands or feet, muscle weakness, change in vision, confusion or trouble speaking, loss of balance or coordination, trouble walking, seizures Unusual bruising or bleeding Side effects that usually do not require medical attention (report to your care team if they continue or are bothersome): Hair loss Nausea Unusual weakness or fatigue Vomiting This list may not describe all possible side effects. Call your doctor for medical advice about side effects. You may report side effects to FDA at 1-800-FDA-1088. Where should I keep my medication? This medication is given in a hospital or clinic. It will not be stored at home. NOTE: This sheet is a summary. It may not cover all possible information. If you have questions about this medicine, talk to your doctor, pharmacist, or health care provider.  2024 Elsevier/Gold Standard (2022-02-10 00:00:00)        To help prevent nausea and vomiting after your treatment, we encourage you to take your nausea medication as directed.  BELOW ARE SYMPTOMS THAT SHOULD BE REPORTED IMMEDIATELY: *FEVER GREATER THAN 100.4 F (38 C) OR HIGHER *CHILLS OR SWEATING *NAUSEA AND VOMITING THAT IS NOT CONTROLLED WITH YOUR NAUSEA MEDICATION *UNUSUAL SHORTNESS OF BREATH *UNUSUAL BRUISING OR BLEEDING *URINARY PROBLEMS (pain or burning when urinating, or frequent urination) *BOWEL PROBLEMS (unusual diarrhea, constipation, pain near the anus) TENDERNESS IN MOUTH AND THROAT WITH OR WITHOUT PRESENCE OF ULCERS (sore throat,  sores in mouth, or a toothache) UNUSUAL RASH, SWELLING OR PAIN  UNUSUAL VAGINAL DISCHARGE OR ITCHING   Items with * indicate a potential emergency and should be followed up as soon as possible or go to the Emergency Department if any problems should occur.  Please show the CHEMOTHERAPY ALERT CARD or IMMUNOTHERAPY ALERT CARD at check-in to the Emergency Department and triage nurse.  Should you have questions after your visit or need to cancel or reschedule your appointment, please contact [facility] [phone]  and follow the prompts.  Office hours are 8:00 a.m. to 4:30 p.m. Monday - Friday. Please note that voicemails left after 4:00 p.m. may not be returned until the following business day.  We are closed weekends and major holidays. You have access to a nurse  at all times for urgent questions. Please call the main number to the clinic [phone] and follow the prompts.  For any non-urgent questions, you may also contact your provider using MyChart. We now offer e-Visits for anyone 2 and older to request care online for non-urgent symptoms. For details visit [email or web address].   Also download the MyChart app! Go to the app store, search "MyChart", open the app, select [place], and log in with your MyChart username and password.

## 2023-08-11 NOTE — Transitions of Care (Post Inpatient/ED Visit) (Signed)
08/11/2023  Name: Miranda Perkins MRN: [id number] DOB: 1961-01-15  Today's TOC FU Call Status:    Attempted to reach the patient regarding the most recent Inpatient/ED visit.  Follow Up Plan: No further outreach attempts will be made at this time. We have been unable to contact the patient.  Jodelle Gross RN, BSN, CCM RN Care Manager  Transitions of Care  [facility] - [facility]  [phone]

## 2023-08-11 NOTE — Progress Notes (Signed)
Ok to treat with elevated HR per MD.  Richardean Sale, RPH, BCPS, BCOP 08/11/2023 10:19 AM

## 2023-08-11 NOTE — Progress Notes (Signed)
Patient presents today for chemotherapy infusion.  Patient is in satisfactory condition with no new complaints voiced.  Vital signs are stable.  Labs from 08/09/2023 reviewed and all labs are within treatment parameters.  Potassium was 3.3 so we will give Klor Con 40 mEq PO x one dose today per standing orders by Dr. Ellin Saba.  We will proceed with treatment per MD orders.  Patient called out with 4/10 coccyx pain.  Patient asked if she could having something for pain as she was very uncomfortable.  MD made aware.  We will give Oxycodone 5 mg PO x one dose today per Dr. Ellin Saba.  Patient tolerated treatment well with no complaints voiced.  Patient left via wheelchair with husband in stable condition.  Vital signs stable at discharge.  Follow up as scheduled.

## 2023-08-23 NOTE — Progress Notes (Signed)
Melissa from [facility] called and reports that per the patient husband, the patient has been bedbound since her last treatment. Dr. Ellin Saba made aware.

## 2023-08-25 ENCOUNTER — Other Ambulatory Visit: Payer: Self-pay

## 2023-08-25 ENCOUNTER — Inpatient Hospital Stay

## 2023-08-25 ENCOUNTER — Telehealth: Payer: Self-pay | Admitting: Family Medicine

## 2023-08-25 ENCOUNTER — Inpatient Hospital Stay: Admitting: Hematology

## 2023-08-25 NOTE — Telephone Encounter (Signed)
Appt made for Monday for [facility] referral

## 2023-08-26 ENCOUNTER — Encounter: Payer: Self-pay | Admitting: Family Medicine

## 2023-08-26 ENCOUNTER — Telehealth (INDEPENDENT_AMBULATORY_CARE_PROVIDER_SITE_OTHER): Admitting: Family Medicine

## 2023-08-26 DIAGNOSIS — R5381 Other malaise: Secondary | ICD-10-CM | POA: Diagnosis not present

## 2023-08-26 DIAGNOSIS — R531 Weakness: Secondary | ICD-10-CM | POA: Diagnosis not present

## 2023-08-26 DIAGNOSIS — M7989 Other specified soft tissue disorders: Secondary | ICD-10-CM

## 2023-08-26 DIAGNOSIS — C3492 Malignant neoplasm of unspecified part of left bronchus or lung: Secondary | ICD-10-CM | POA: Diagnosis not present

## 2023-08-26 DIAGNOSIS — R2231 Localized swelling, mass and lump, right upper limb: Secondary | ICD-10-CM

## 2023-08-26 DIAGNOSIS — Z7409 Other reduced mobility: Secondary | ICD-10-CM

## 2023-08-26 DIAGNOSIS — Z789 Other specified health status: Secondary | ICD-10-CM

## 2023-08-26 NOTE — Progress Notes (Signed)
Virtual Visit via Video   I connected with patient's spouse on 08/26/23 at 1115 by a video enabled telemedicine application and verified that I am speaking with the correct person using two identifiers.  Location patient: Home Location provider: [facility] Persons participating in the virtual visit: Patient and Provider  I discussed the limitations of evaluation and management by telemedicine and the availability of in person appointments. The patient expressed understanding and agreed to proceed.  Subjective:   HPI:  Pt presents today for  Chief Complaint  Patient presents with   Fatigue   Discussed the use of AI scribe software for clinical note transcription with the patient, who gave verbal consent to proceed.  History of Present Illness   The patient, under palliative care for lung cancer, has been bedridden for approximately two weeks following her last chemotherapy session. She has developed significant swelling in her right hand, described as resembling a 'boxing glove,' and in both feet. The swelling is suspected to be due to a combination of malnutrition, immobility, and possibly high sodium intake.  The patient's care has been primarily managed at home by her spouse, who reports having to assist with all activities of daily living, including feeding, bathing, and toileting. The spouse has also been managing the patient's bed sores. The patient's mobility has significantly declined since her chemotherapy treatments, with the spouse reporting that the patient has lost all strength in her legs.  The patient has been receiving intermittent visits from a nurse under a palliative care team from [facility], and another nurse from an unspecified company that previously provided physical therapy. However, these visits have been infrequent and reportedly unhelpful, with the nurses primarily engaging in conversation with the patient and performing minimal  physical assessments. The spouse has expressed concern about leaving the patient alone at home due to her immobility and total care needs.  Despite these challenges, the spouse has made efforts to improve the patient's living conditions, including purchasing a medical lift chair and a bed that functions similarly to a hospital bed. She has also installed a ramp at their home and acquired a wheelchair to facilitate the patient's transportation to her upcoming chemotherapy appointment.       ROS per HPI   Patient Active Problem List   Diagnosis Date Noted   Malnutrition of moderate degree 08/03/2023   Pressure injury of skin 08/01/2023   Left lower lobe consolidation (HCC) 07/31/2023   PNA (pneumonia) 07/06/2023   Severe sepsis (HCC) 07/06/2023   Metastatic primary lung cancer, left (HCC) 06/23/2023   Class II obesity 06/22/2023   Leukocytosis 06/21/2023   Ileus (HCC) 06/20/2023   Pulmonary nodule 06/14/2023   Thyroid nodule 06/11/2023   Intractable nausea and vomiting 06/11/2023   Hypokalemia 06/11/2023   Transaminitis 06/11/2023   recent history of C. difficile colitis 06/11/2023   Malignant neoplasm of unspecified part of unspecified bronchus or lung (HCC) 05/26/2023   Hilar mass 05/21/2023   Prediabetes 04/27/2023   Adrenal hemorrhage (HCC) 04/23/2023   Class 3 severe obesity due to excess calories with serious comorbidity and body mass index (BMI) of 40.0 to 44.9 in adult (HCC) 12/29/2021   DOE (dyspnea on exertion) 03/20/2021   Upper airway cough syndrome vs cough variant asthma 03/20/2021   Elevated testosterone level in female 12/20/2020   Female hirsutism 12/20/2020   Hx of total knee arthroplasty, left 01/26/2018   Spinal stenosis of cervical region 09/27/2017   Insomnia 09/14/2016   BMI  39.0-39.9,adult 09/14/2016   Swelling of both lower extremities 11/28/2015   Nontoxic multinodular goiter 11/28/2015   Vitamin B 12 deficiency 11/28/2015   Rosacea 11/28/2015     Social History   Tobacco Use   Smoking status: Former    Current packs/day: 0.00    Average packs/day: 0.5 packs/day for 35.0 years (17.5 ttl pk-yrs)    Types: Cigarettes    Start date: 11/26/1974    Quit date: 11/26/2009    Years since quitting: 13.7   Smokeless tobacco: Never  Substance Use Topics   Alcohol use: Not Currently    Current Outpatient Medications:    acetaminophen (TYLENOL) 325 MG tablet, Take 2 tablets (650 mg total) by mouth every 6 (six) hours as needed for mild pain (or Fever >/= 101)., Disp: , Rfl:    albuterol (PROVENTIL) (2.5 MG/3ML) 0.083% nebulizer solution, Take 3 mLs (2.5 mg total) by nebulization every 6 (six) hours as needed for wheezing or shortness of breath., Disp: 150 mL, Rfl: 1   feeding supplement (ENSURE ENLIVE / ENSURE PLUS) LIQD, Take 237 mLs by mouth 3 (three) times daily between meals., Disp: , Rfl:    folic acid (FOLVITE) 1 MG tablet, Take 1 tablet (1 mg total) by mouth daily., Disp: 90 tablet, Rfl: 3   furosemide (LASIX) 40 MG tablet, Take 1 tablet (40 mg total) by mouth daily., Disp: 90 tablet, Rfl: 3   guaiFENesin (MUCINEX) 600 MG 12 hr tablet, Take 2 tablets (1,200 mg total) by mouth 2 (two) times daily., Disp: 20 tablet, Rfl: 0   HYDROcodone bit-homatropine (HYCODAN) 5-1.5 MG/5ML syrup, Take 5 mLs by mouth every 6 (six) hours as needed (refractory cough)., Disp: 120 mL, Rfl: 0   iron polysaccharides (NIFEREX) 150 MG capsule, Take 1 capsule (150 mg total) by mouth daily., Disp: 30 capsule, Rfl: 3   megestrol (MEGACE) 40 MG/ML suspension, Take by mouth., Disp: , Rfl:    metoprolol tartrate (LOPRESSOR) 25 MG tablet, Take 0.5 tablets (12.5 mg total) by mouth 2 (two) times daily., Disp: 30 tablet, Rfl: 1   midodrine (PROAMATINE) 10 MG tablet, Take 1 tablet (10 mg total) by mouth 2 (two) times daily with a meal. For soft BP, Disp: 60 tablet, Rfl: 1   mirtazapine (REMERON) 15 MG tablet, Take 1 tablet (15 mg total) by mouth at bedtime., Disp: 30 tablet,  Rfl: 2   Multiple Vitamin (MULTIVITAMIN WITH MINERALS) TABS tablet, Take 1 tablet by mouth daily., Disp: 100 tablet, Rfl: 1   oxyCODONE (OXY IR/ROXICODONE) 5 MG immediate release tablet, Take 1 tablet (5 mg total) by mouth every 4 (four) hours as needed for moderate pain., Disp: 180 tablet, Rfl: 0   Potassium Chloride ER 20 MEQ TBCR, Take 1 tablet (20 mEq total) by mouth daily. 1 tab daily by mouth--- take while taking Lasix/furosemide, Disp: 30 tablet, Rfl: 1   prochlorperazine (COMPAZINE) 10 MG tablet, Take 1 tablet (10 mg total) by mouth every 6 (six) hours as needed for nausea or vomiting., Disp: 60 tablet, Rfl: 2   saccharomyces boulardii (FLORASTOR) 250 MG capsule, Take 1 capsule (250 mg total) by mouth 2 (two) times daily., Disp: 60 capsule, Rfl: 0  Allergies  Allergen Reactions   Influenza Vaccines Anaphylaxis and Other (See Comments)    Per patient   Penicillins Anaphylaxis and Other (See Comments)   Articaine Other (See Comments)    Caused infection   Cortisone Other (See Comments)    Turned red and ran a low grade fever  for 3 days   Other Rash    bandaids- skin turns red and a rash     Objective:   There were no vitals taken for this visit.  Pt is resting in hospital bed at home. Not communicating with provider. Swelling noted to right hand. No erythema or forearm swelling.   Assessment and Plan:   Miranda "Dennie Bible" was seen today for fatigue.  Diagnoses and all orders for this visit:  Metastatic primary lung cancer, left (HCC) -     Ambulatory referral to Home Health  Swelling of both lower extremities -     Ambulatory referral to Home Health  Physical deconditioning -     Ambulatory referral to Home Health  Weakness -     Ambulatory referral to Home Health  Localized swelling on right hand -     Ambulatory referral to Home Health  Impaired mobility and ADLs -     Ambulatory referral to Home Health  Assessment and Plan    Bedridden status post chemotherapy    Patient has been bedridden for approximately two weeks following her last chemotherapy session. She is currently under palliative care from [facility], but services have been limited. The patient's husband is providing total care, including feeding, bathing, and toileting. He is also working and has expressed concern about leaving her alone.   -Referral to a local home health agency for skilled nursing, wound care, and an aide for assistance with ADLs.    Edema in right hand and feet   Likely due to a combination of malnutrition, immobility, and possibly high sodium intake. No other symptoms reported.   -Advise to continue propping the arm up on a pillow and monitor for any worsening or additional symptoms.    Upcoming chemotherapy appointment   Patient's husband plans to get her out of bed and into a chair over the next three days in preparation for her chemotherapy appointment.   -Advise to monitor her strength and tolerance to activity, and to contact the healthcare team if there are any changes or concerns.         Return if symptoms worsen or fail to improve.  Kari Baars, FNP-C [facility] [address] [place], [place] [address] [phone]  08/26/2023  Time spent with the patient: 25 minutes, of which >50% was spent in obtaining information about symptoms, reviewing previous labs, evaluations, and treatments, counseling about condition (please see the discussed topics above), and developing a plan to further investigate it; had a number of questions which I addressed.

## 2023-08-30 ENCOUNTER — Ambulatory Visit: Admitting: Nurse Practitioner

## 2023-09-01 ENCOUNTER — Encounter: Payer: Self-pay | Admitting: Family Medicine

## 2023-09-01 ENCOUNTER — Inpatient Hospital Stay

## 2023-09-01 ENCOUNTER — Other Ambulatory Visit: Payer: Self-pay | Admitting: *Deleted

## 2023-09-01 ENCOUNTER — Inpatient Hospital Stay (HOSPITAL_BASED_OUTPATIENT_CLINIC_OR_DEPARTMENT_OTHER): Admitting: Hematology

## 2023-09-01 VITALS — BP 124/56 | HR 112 | Temp 98.6°F | Resp 20

## 2023-09-01 DIAGNOSIS — C3492 Malignant neoplasm of unspecified part of left bronchus or lung: Secondary | ICD-10-CM

## 2023-09-01 DIAGNOSIS — R112 Nausea with vomiting, unspecified: Secondary | ICD-10-CM

## 2023-09-01 DIAGNOSIS — E441 Mild protein-calorie malnutrition: Secondary | ICD-10-CM

## 2023-09-01 DIAGNOSIS — Z5112 Encounter for antineoplastic immunotherapy: Secondary | ICD-10-CM | POA: Diagnosis not present

## 2023-09-01 DIAGNOSIS — C3490 Malignant neoplasm of unspecified part of unspecified bronchus or lung: Secondary | ICD-10-CM

## 2023-09-01 LAB — CBC WITH DIFFERENTIAL/PLATELET
Abs Immature Granulocytes: 1.5 10*3/uL — ABNORMAL HIGH (ref 0.00–0.07)
Band Neutrophils: 9 %
Basophils Absolute: 0 10*3/uL (ref 0.0–0.1)
Basophils Relative: 0 %
Eosinophils Absolute: 0 10*3/uL (ref 0.0–0.5)
Eosinophils Relative: 0 %
HCT: 33.6 % — ABNORMAL LOW (ref 36.0–46.0)
Hemoglobin: 9.6 g/dL — ABNORMAL LOW (ref 12.0–15.0)
Lymphocytes Relative: 8 %
Lymphs Abs: 1.7 10*3/uL (ref 0.7–4.0)
MCH: 25.6 pg — ABNORMAL LOW (ref 26.0–34.0)
MCHC: 28.6 g/dL — ABNORMAL LOW (ref 30.0–36.0)
MCV: 89.6 fL (ref 80.0–100.0)
Metamyelocytes Relative: 4 %
Monocytes Absolute: 1.3 10*3/uL — ABNORMAL HIGH (ref 0.1–1.0)
Monocytes Relative: 6 %
Myelocytes: 3 %
Neutro Abs: 16.7 10*3/uL — ABNORMAL HIGH (ref 1.7–7.7)
Neutrophils Relative %: 70 %
Platelets: 1022 10*3/uL (ref 150–400)
RBC: 3.75 MIL/uL — ABNORMAL LOW (ref 3.87–5.11)
RDW: 24.7 % — ABNORMAL HIGH (ref 11.5–15.5)
Smear Review: INCREASED
WBC: 21.2 10*3/uL — ABNORMAL HIGH (ref 4.0–10.5)
nRBC: 0.3 % — ABNORMAL HIGH (ref 0.0–0.2)

## 2023-09-01 LAB — MAGNESIUM: Magnesium: 1.9 mg/dL (ref 1.7–2.4)

## 2023-09-01 LAB — COMPREHENSIVE METABOLIC PANEL
ALT: 62 U/L — ABNORMAL HIGH (ref 0–44)
AST: 56 U/L — ABNORMAL HIGH (ref 15–41)
Albumin: 2.1 g/dL — ABNORMAL LOW (ref 3.5–5.0)
Alkaline Phosphatase: 99 U/L (ref 38–126)
Anion gap: 13 (ref 5–15)
BUN: 13 mg/dL (ref 8–23)
CO2: 26 mmol/L (ref 22–32)
Calcium: 8.6 mg/dL — ABNORMAL LOW (ref 8.9–10.3)
Chloride: 93 mmol/L — ABNORMAL LOW (ref 98–111)
Creatinine, Ser: 0.58 mg/dL (ref 0.44–1.00)
GFR, Estimated: >60 mL/min (ref >60)
Glucose, Bld: 118 mg/dL — ABNORMAL HIGH (ref 70–99)
Potassium: 3.5 mmol/L (ref 3.5–5.1)
Sodium: 132 mmol/L — ABNORMAL LOW (ref 135–145)
Total Bilirubin: 0.7 mg/dL (ref 0.3–1.2)
Total Protein: 6.7 g/dL (ref 6.5–8.1)

## 2023-09-01 MED ORDER — SERTRALINE HCL 100 MG PO TABS: 100.0000 mg | ORAL_TABLET | Freq: Every day | ORAL | 3 refills | Status: DC

## 2023-09-01 MED ORDER — NIVOLUMAB CHEMO INJECTION 100 MG/10ML
360.0000 mg | Freq: Once | INTRAVENOUS | Status: AC
  Administered 2023-09-01: 360 mg via INTRAVENOUS
  Filled 2023-09-01: qty 24

## 2023-09-01 MED ORDER — CETIRIZINE HCL 10 MG/ML IV SOLN
10.0000 mg | Freq: Once | INTRAVENOUS | Status: AC
  Administered 2023-09-01: 10 mg via INTRAVENOUS
  Filled 2023-09-01: qty 1

## 2023-09-01 MED ORDER — SODIUM CHLORIDE 0.9 % IV SOLN: INTRAVENOUS | Status: AC

## 2023-09-01 MED ORDER — SODIUM CHLORIDE 0.9% FLUSH
10.0000 mL | Freq: Once | INTRAVENOUS | Status: AC
  Administered 2023-09-01: 10 mL via INTRAVENOUS

## 2023-09-01 MED ORDER — FAMOTIDINE IN NACL 20-0.9 MG/50ML-% IV SOLN
20.0000 mg | Freq: Once | INTRAVENOUS | Status: AC
  Administered 2023-09-01: 20 mg via INTRAVENOUS
  Filled 2023-09-01: qty 50

## 2023-09-01 MED ORDER — HEPARIN SOD (PORK) LOCK FLUSH 100 UNIT/ML IV SOLN
500.0000 [IU] | Freq: Once | INTRAVENOUS | Status: AC | PRN
  Administered 2023-09-01: 500 [IU]

## 2023-09-01 MED ORDER — DRONABINOL 5 MG PO CAPS: 5.0000 mg | ORAL_CAPSULE | Freq: Two times a day (BID) | ORAL | 0 refills | Status: AC

## 2023-09-01 MED ORDER — ALBUMIN HUMAN 25 % IV SOLN
50.0000 g | Freq: Once | INTRAVENOUS | Status: AC
  Administered 2023-09-01: 50 g via INTRAVENOUS
  Filled 2023-09-01: qty 200

## 2023-09-01 MED ORDER — SODIUM CHLORIDE 0.9 % IV SOLN: Freq: Once | INTRAVENOUS | Status: AC

## 2023-09-01 MED ORDER — SODIUM CHLORIDE 0.9 % IV SOLN
1.0000 mg/kg | Freq: Once | INTRAVENOUS | Status: AC
  Administered 2023-09-01: 100 mg via INTRAVENOUS
  Filled 2023-09-01: qty 20

## 2023-09-01 MED ORDER — SODIUM CHLORIDE 0.9% FLUSH
10.0000 mL | INTRAVENOUS | Status: DC | PRN
Start: 2023-09-01 — End: 2023-09-28
  Administered 2023-09-01: 10 mL

## 2023-09-01 NOTE — Progress Notes (Signed)
CRITICAL VALUE ALERT Critical value received:  platelets 1,022 Date of notification:  09-01-23 Time of notification: 1021 Critical value read back:  Yes.   Nurse who received alert:  C.Daysy Santini RN MD notified time and response:  Dr.Katragadda 1022. No new orders.

## 2023-09-01 NOTE — Progress Notes (Signed)
OK to proceed with elevated HR 128 today.  Unable to get patient weight today.  T.O. Dr Carilyn Goodpasture, PharmD

## 2023-09-01 NOTE — Progress Notes (Signed)
Patient presents today for chemotherapy infusion. Patient is in satisfactory condition with no new complaints voiced.  Vital signs are stable.  Labs reviewed by Dr. Ellin Saba during the office visit.  All labs are within treatment parameters.  Albumin today is 2.1.  Platelets are 1,022.  MD aware.  We will give Albumin 50 grams IV x one dose today per Dr. Ellin Saba.  We will also give NS 500 mL over one hour x one today per Dr. Ellin Saba. We will proceed with treatment per MD orders.  Patient tolerated treatment well with no complaints voiced.  Patient left via wheelchair with husband and daughter in stable condition.  Vital signs stable at discharge.  Follow up as scheduled.

## 2023-09-01 NOTE — Patient Instructions (Signed)
[facility]  Discharge Instructions: Thank you for choosing [facility] to provide your oncology and hematology care.  If you have a lab appointment with the Cancer Center - please note that after April 8th, 2024, all labs will be drawn in the cancer center.  You do not have to check in or register with the main entrance as you have in the past but will complete your check-in in the cancer center.  Wear comfortable clothing and clothing appropriate for easy access to any Portacath or PICC line.   We strive to give you quality time with your provider. You may need to reschedule your appointment if you arrive late (15 or more minutes).  Arriving late affects you and other patients whose appointments are after yours.  Also, if you miss three or more appointments without notifying the office, you may be dismissed from the clinic at the provider's discretion.      For prescription refill requests, have your pharmacy contact our office and allow 72 hours for refills to be completed.    Today you received the following chemotherapy and/or immunotherapy agents Opdivo/Yervoy.  Nivolumab Injection What is this medication? NIVOLUMAB (nye VOL ue mab) treats some types of cancer. It works by helping your immune system slow or stop the spread of cancer cells. It is a monoclonal antibody. This medicine may be used for other purposes; ask your health care provider or pharmacist if you have questions. COMMON BRAND NAME(S): Opdivo What should I tell my care team before I take this medication? They need to know if you have any of these conditions: Allogeneic stem cell transplant (uses someone else's stem cells) Autoimmune diseases, such as Crohn disease, ulcerative colitis, lupus History of chest radiation Nervous system problems, such as Guillain-Barre syndrome or myasthenia gravis Organ transplant An unusual or allergic reaction to nivolumab, other medications, foods, dyes,  or preservatives Pregnant or trying to get pregnant Breast-feeding How should I use this medication? This medication is infused into a vein. It is given in a hospital or clinic setting. A special MedGuide will be given to you before each treatment. Be sure to read this information carefully each time. Talk to your care team about the use of this medication in children. While it may be prescribed for children as young as 12 years for selected conditions, precautions do apply. Overdosage: If you think you have taken too much of this medicine contact a poison control center or emergency room at once. NOTE: This medicine is only for you. Do not share this medicine with others. What if I miss a dose? Keep appointments for follow-up doses. It is important not to miss your dose. Call your care team if you are unable to keep an appointment. What may interact with this medication? Interactions have not been studied. This list may not describe all possible interactions. Give your health care provider a list of all the medicines, herbs, non-prescription drugs, or dietary supplements you use. Also tell them if you smoke, drink alcohol, or use illegal drugs. Some items may interact with your medicine. What should I watch for while using this medication? Your condition will be monitored carefully while you are receiving this medication. You may need blood work while taking this medication. This medication may cause serious skin reactions. They can happen weeks to months after starting the medication. Contact your care team right away if you notice fevers or flu-like symptoms with a rash. The rash may be red  or purple and then turn into blisters or peeling of the skin. You may also notice a red rash with swelling of the face, lips, or lymph nodes in your neck or under your arms. Tell your care team right away if you have any change in your eyesight. Talk to your care team if you are pregnant or think you might be  pregnant. A negative pregnancy test is required before starting this medication. A reliable form of contraception is recommended while taking this medication and for 5 months after the last dose. Talk to your care team about effective forms of contraception. Do not breast-feed while taking this medication and for 5 months after the last dose. What side effects may I notice from receiving this medication? Side effects that you should report to your care team as soon as possible: Allergic reactions--skin rash, itching, hives, swelling of the face, lips, tongue, or throat Dry cough, shortness of breath or trouble breathing Eye pain, redness, irritation, or discharge with blurry or decreased vision Heart muscle inflammation--unusual weakness or fatigue, shortness of breath, chest pain, fast or irregular heartbeat, dizziness, swelling of the ankles, feet, or hands Hormone gland problems--headache, sensitivity to light, unusual weakness or fatigue, dizziness, fast or irregular heartbeat, increased sensitivity to cold or heat, excessive sweating, constipation, hair loss, increased thirst or amount of urine, tremors or shaking, irritability Infusion reactions--chest pain, shortness of breath or trouble breathing, feeling faint or lightheaded Kidney injury (glomerulonephritis)--decrease in the amount of urine, red or dark brown urine, foamy or bubbly urine, swelling of the ankles, hands, or feet Liver injury--right upper belly pain, loss of appetite, nausea, light-colored stool, dark yellow or brown urine, yellowing skin or eyes, unusual weakness or fatigue Pain, tingling, or numbness in the hands or feet, muscle weakness, change in vision, confusion or trouble speaking, loss of balance or coordination, trouble walking, seizures Rash, fever, and swollen lymph nodes Redness, blistering, peeling, or loosening of the skin, including inside the mouth Sudden or severe stomach pain, bloody diarrhea, fever, nausea,  vomiting Side effects that usually do not require medical attention (report these to your care team if they continue or are bothersome): Bone, joint, or muscle pain Diarrhea Fatigue Loss of appetite Nausea Skin rash This list may not describe all possible side effects. Call your doctor for medical advice about side effects. You may report side effects to FDA at 1-800-FDA-1088. Where should I keep my medication? This medication is given in a hospital or clinic. It will not be stored at home. NOTE: This sheet is a summary. It may not cover all possible information. If you have questions about this medicine, talk to your doctor, pharmacist, or health care provider.  2024 Elsevier/Gold Standard (2022-02-16 00:00:00)        To help prevent nausea and vomiting after your treatment, we encourage you to take your nausea medication as directed.  BELOW ARE SYMPTOMS THAT SHOULD BE REPORTED IMMEDIATELY: *FEVER GREATER THAN 100.4 F (38 C) OR HIGHER *CHILLS OR SWEATING *NAUSEA AND VOMITING THAT IS NOT CONTROLLED WITH YOUR NAUSEA MEDICATION *UNUSUAL SHORTNESS OF BREATH *UNUSUAL BRUISING OR BLEEDING *URINARY PROBLEMS (pain or burning when urinating, or frequent urination) *BOWEL PROBLEMS (unusual diarrhea, constipation, pain near the anus) TENDERNESS IN MOUTH AND THROAT WITH OR WITHOUT PRESENCE OF ULCERS (sore throat, sores in mouth, or a toothache) UNUSUAL RASH, SWELLING OR PAIN  UNUSUAL VAGINAL DISCHARGE OR ITCHING   Items with * indicate a potential emergency and should be followed up as soon as  possible or go to the Emergency Department if any problems should occur.  Please show the CHEMOTHERAPY ALERT CARD or IMMUNOTHERAPY ALERT CARD at check-in to the Emergency Department and triage nurse.  Should you have questions after your visit or need to cancel or reschedule your appointment, please contact [facility] [phone]  and follow the prompts.  Office hours are 8:00  a.m. to 4:30 p.m. Monday - Friday. Please note that voicemails left after 4:00 p.m. may not be returned until the following business day.  We are closed weekends and major holidays. You have access to a nurse at all times for urgent questions. Please call the main number to the clinic [phone] and follow the prompts.  For any non-urgent questions, you may also contact your provider using MyChart. We now offer e-Visits for anyone 32 and older to request care online for non-urgent symptoms. For details visit [email or web address].   Also download the MyChart app! Go to the app store, search "MyChart", open the app, select [place], and log in with your MyChart username and password.

## 2023-09-01 NOTE — Progress Notes (Signed)
[facility] [address], [place] [address]    Clinic Day:  09/01/2023  Referring physician: [name], DO  Patient Care Team: [name], DO as PCP - General (Family Medicine) [name] [name], MD as PCP - Cardiology (Cardiology) [name], MD as Medical Oncologist (Medical Oncology) [name], RN as Oncology Nurse Navigator (Medical Oncology)   ASSESSMENT & PLAN:   Assessment: 1.  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  Continues along the superior margin of the right kidney. - CTAP on 05/07/2023: Interval increase in size of the right adrenal gland measuring 7.6 cm, previously 4.9 cm.  A large area of decreased attenuation noted likely related to resolving hematoma.  No active hemorrhage seen. - CTAP (05/20/2023): Further enlargement of right adrenal gland measuring 8.9 x 6.7 cm, interim finding of moderate slightly loculated fluid surrounding the superior pole of the right kidney. - CT chest angiogram (05/20/2023): Solid left hilar mass measuring 5.3 x 3.9 cm with partial encasement of left-sided pulmonary vessels.  Mild narrowing of the left lower lobe bronchi but no occlusion.  Irregular focus in the left lower lobe measuring 16 x 8 mm. - Guardant360: BRIP1, PTEN deletion,STK11,KRAS G12A, KRAS amplification - NGS: PD-L1 (22 C3) TPS score 75%.  K-ras G 12A, STK 11. - Cycle 1 of carboplatin, pemetrexed, nivolumab and ipilimumab (Checkmate 9 LA) started on 07/14/2023   2.  Social/family history: - Lives with husband at home.  Independent of ADLs and IADLs.  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  Quit cigarette smoking in 2010 and smoked half pack per day for 15 years. - No family history of bleeding.  Mother had 3 miscarriages and 5 levels.  No family history of antiphospholipid syndrome.  Father and paternal grandfather had cancer.  Maternal first cousin had blood cancer.    Plan: 1.  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 - She has become severely weak.  Mostly confined to the bed. - We had a prolonged discussion about goals of care.  She would like to continue treatment.  If she had severe fatigue from chemotherapy, likely it will improve as we are not giving any more chemotherapy.  She will receive combination ipilimumab and nivolumab today. - I will arrange for CT CAP in 2 weeks. - We will start her on sertraline 100 mg daily.  She will discontinue Remeron.   2.  Abdominal/right back pain: - Pain has improved since cycle 1 of chemotherapy.  Not requiring oxycodone.   3.  Nausea/vomiting: - Continue Zofran/Compazine as needed.   4.  Protein malnutrition: - Her albumin is low at 2.1.  She is not eating well. - She is on Remeron and taking Megace which are not helping. - Will start her on Marinol 5 mg twice daily.   5.  Leg swellings: - From hypoalbuminemia. - Will give albumin 50 g today.    Orders Placed This Encounter  Procedures   CT CHEST ABDOMEN PELVIS W CONTRAST    Standing Status:   Future    Standing  Expiration Date:   08/31/2024    Order Specific Question:   If indicated for the ordered procedure, I authorize the administration of contrast media per Radiology protocol    Answer:   Yes    Order Specific Question:   Does the patient have a contrast media/X-ray dye allergy?    Answer:   No    Order Specific Question:   Preferred imaging location?    Answer:   [facility]    Order Specific Question:   Release to patient    Answer:   Immediate     Order Specific Question:   If indicated for the ordered procedure, I authorize the administration of oral contrast media per Radiology protocol    Answer:   Yes      I,Katie Daubenspeck,acting as a scribe for [name], MD.,have documented all relevant documentation on the behalf of [name], MD,as directed by  [name], MD while in the presence of [name], MD.   I, [name] MD, have reviewed the above documentation for accuracy and completeness, and I agree with the above.   [name], MD   10/30/20246:19 PM  CHIEF COMPLAINT:   Diagnosis: Metastatic lung cancer to the adrenal gland    Cancer Staging  Metastatic primary lung cancer, left [facility]) Staging form: Lung, AJCC 8th Edition - Clinical stage from 07/01/2023: Stage IVB (cT3, cN0, pM1c) - Signed by [name], MD on 07/01/2023    Prior Therapy: none  Current Therapy:  Carboplatin, pemetrexed, nivolumab and ipilimumab    HISTORY OF PRESENT ILLNESS:   Oncology History  Metastatic primary lung cancer, left (HCC)  06/23/2023 Initial Diagnosis   Metastatic primary lung cancer, left (HCC)   07/01/2023 Cancer Staging   Staging form: Lung, AJCC 8th Edition - Clinical stage from 07/01/2023: Stage IVB (cT3, cN0, pM1c) - Signed by [name], MD on 07/01/2023 Histopathologic type: Adenocarcinoma, NOS   07/14/2023 -  Chemotherapy   Patient is on Treatment Plan : LUNG NSCLC NON-SQUAMOUS Nivolumab + Ipilimumab + Carboplatin + Pemetrexed q42d X 1 cycle / Maintenance Nivolumab + Ipilimumab q42d        INTERVAL HISTORY:   Miranda Perkins is a 62 y.o. female presenting to clinic today for follow up of Metastatic lung cancer to the adrenal gland. She was last seen by me on 08/08/23.  Today, she states that she is doing well overall. Her appetite level is at 50%. Her energy level is at 5%.  PAST MEDICAL HISTORY:   Past Medical History: Past Medical History:   Diagnosis Date   Adrenal hemorrhage (HCC)    Anemia    Arthritis    Cancer (HCC)    skin cancer - basal   Dyspnea    Edema of both lower extremities    Endometrial polyp    Hirsutism    History of 2019 novel coronavirus disease (COVID-19) 12/21/2020   positive home result documented in pcp note in epic 12-23-2020,  residual doe, pulmonology consult w/ dr wert note in epic 03-20-2021   History of basal cell carcinoma (BCC) excision 2016   forehead, per pt no recurrence   History of CVA (cerebrovascular accident) without residual deficits 1993   pt stated , while on Chloramphenicol, no residuaL; [medication given to treat tick bite] ; patient states  "i stroked out right next to my doctor , he said i passed out and that was my only symptom" ;  no resiudal   History of mouth cancer 2015  per pt surgically removal and cauterized palette , 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 branch,  work-up results in epic 03/ 2022 (normal nuclear stress test, normal echo, no arrhythmia's per event monitor);   (05-28-2021 per pt no symptoms since 03/ 2022)   Multiple thyroid nodules    endocrinologist--- dr g. nida,  hx benign bx, clinically euthyroid   Osteopenia 12/2017   T score -1.2 FRAX 4.9% / 0.3%   PMB (postmenopausal bleeding)    Pneumonia    x 2   Post-COVID chronic dyspnea    Pre-diabetes    Rosacea    Stroke (HCC)    Thickened endometrium     Surgical History: Past Surgical History:  Procedure Laterality Date   ANTERIOR AND POSTERIOR REPAIR N/A 08/21/2020   Procedure: ANTERIOR (CYSTOCELE) AND POSTERIOR REPAIR (RECTOCELE);  Surgeon: Genia Del, MD;  Location: [facility];  Service: Gynecology;  Laterality: N/A;  requesting 9:00am OR time  requests one hour   ANTERIOR CERVICAL DECOMP/DISCECTOMY FUSION N/A 09/27/2017   Procedure: ANTERIOR CERVICAL DECOMPRESSION/DISCECTOMY FUSION CERVICAL  FOUR-FIVE ,CERVICAL FIVE-SIX,CERVICAL SIX-SEVEN;  Surgeon: Donalee Citrin, MD;  Location: [facility] OR;  Service: Neurosurgery;  Laterality: N/A;   AXILLARY LYMPH NODE BIOPSY Right 06/24/2023   Procedure: AXILLARY LYMPH NODE BIOPSY;  Surgeon: Franky Macho, MD;  Location: [facility];  Service: General;  Laterality: Right;   BRONCHIAL BIOPSY  06/14/2023   Procedure: BRONCHIAL BIOPSIES;  Surgeon: Leslye Peer, MD;  Location: [facility] ENDOSCOPY;  Service: Pulmonary;;   BRONCHIAL BRUSHINGS  06/14/2023   Procedure: BRONCHIAL BRUSHINGS;  Surgeon: Leslye Peer, MD;  Location: [facility] ENDOSCOPY;  Service: Pulmonary;;   BRONCHIAL NEEDLE ASPIRATION BIOPSY  06/14/2023   Procedure: BRONCHIAL NEEDLE ASPIRATION BIOPSIES;  Surgeon: Leslye Peer, MD;  Location: [facility] ENDOSCOPY;  Service: Pulmonary;;   CARPAL TUNNEL RELEASE Left 09/27/2017   Procedure: CARPAL TUNNEL RELEASE;  Surgeon: Donalee Citrin, MD;  Location: [facility] OR;  Service: Neurosurgery;  Laterality: Left;   DILATATION & CURETTAGE/HYSTEROSCOPY WITH MYOSURE N/A 06/03/2021   Procedure: DILATATION & CURETTAGE/HYSTEROSCOPY WITH MYOSURE;  Surgeon: Genia Del, MD;  Location: [facility];  Service: Gynecology;  Laterality: N/A;   FOOT SURGERY     bone removed from pinky toe   HEMOSTASIS CONTROL  06/14/2023   Procedure: HEMOSTASIS CONTROL;  Surgeon: Leslye Peer, MD;  Location: [facility] ENDOSCOPY;  Service: Pulmonary;;   INGUINAL HERNIA REPAIR Right 1995   KNEE ARTHROSCOPY W/ MENISCAL REPAIR Left 06/21/2017   dr Netta Corrigan   LEG SURGERY  1972   MOHS SURGERY  2016   [facility] of forehead   MOUTH SURGERY  2015   removal and cauterization pallete of cancerous lesion   PORTACATH PLACEMENT N/A 06/24/2023   Procedure: INSERTION PORT-A-CATH;  Surgeon: Franky Macho, MD;  Location: [facility];  Service: General;  Laterality: N/A;   TONSILLECTOMY  1979   TONSILLECTOMY  1977   TOTAL KNEE ARTHROPLASTY Left 01/26/2018   Procedure: LEFT TOTAL KNEE ARTHROPLASTY;  Surgeon: Ranee Gosselin,  MD;  Location: [facility];  Service: Orthopedics;  Laterality: Left;   TUBAL LIGATION     VIDEO BRONCHOSCOPY WITH ENDOBRONCHIAL ULTRASOUND Bilateral 06/14/2023   Procedure: VIDEO BRONCHOSCOPY WITH ENDOBRONCHIAL ULTRASOUND;  Surgeon: Leslye Peer, MD;  Location: [facility] ENDOSCOPY;  Service: Pulmonary;  Laterality: Bilateral;    Social History: Social History   Socioeconomic History   Marital status: Married  Spouse name: Not on file   Number of children: 3   Years of education: Not on file   Highest education level: Associate degree: occupational, Scientist, product/process development, or vocational program  Occupational History   Occupation: retired    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, television/film/video for [employer]   Tobacco Use   Smoking status: Former    Current packs/day: 0.00    Average packs/day: 0.5 packs/day for 35.0 years (17.5 ttl pk-yrs)    Types: Cigarettes    Start date: 11/26/1974    Quit date: 11/26/2009    Years since quitting: 13.7   Smokeless tobacco: Never  Vaping Use   Vaping status: Never Used  Substance and Sexual Activity   Alcohol use: Not Currently   Drug use: No   Sexual activity: Not Currently    Partners: Male    Birth control/protection: Post-menopausal    Comment: 1st intercourse 49 yo-5 partners, married- 25 yrs  Other Topics Concern   Not on file  Social History Narrative   Lives with husband. Raising 2 grandchildren of deceased daughter.       Lives in one story ranch house.    Social Determinants of Health   Financial Resource Strain: Low Risk  (08/25/2023)   Overall Financial Resource Strain (CARDIA)    Difficulty of Paying Living Expenses: Not hard at all  Food Insecurity: No Food Insecurity (08/25/2023)   Hunger Vital Sign    Worried About Running Out of Food in the Last Year: Never true    Ran Out of Food in the Last Year: Never true  Transportation Needs: No Transportation Needs (08/25/2023)   PRAPARE - Administrator, Civil Service (Medical): No    Lack of  Transportation (Non-Medical): No  Physical Activity: Inactive (08/25/2023)   Exercise Vital Sign    Days of Exercise per Week: 0 days    Minutes of Exercise per Session: 90 min  Stress: No Stress Concern Present (08/25/2023)   [employer] of Occupational Health - Occupational Stress Questionnaire    Feeling of Stress : Only a little  Recent Concern: Stress - Stress Concern Present (06/09/2023)   [employer] of Occupational Health - Occupational Stress Questionnaire    Feeling of Stress : To some extent  Social Connections: Socially Isolated (08/25/2023)   Social Connection and Isolation Panel [NHANES]    Frequency of Communication with Friends and Family: Never    Frequency of Social Gatherings with Friends and Family: Never    Attends Religious Services: Never    Database administrator or Organizations: No    Attends Banker Meetings: Never    Marital Status: Married  Catering manager Violence: Not At Risk (08/01/2023)   Humiliation, Afraid, Rape, and Kick questionnaire    Fear of Current or Ex-Partner: No    Emotionally Abused: No    Physically Abused: No    Sexually Abused: No    Family History: Family History  Problem Relation Age of Onset   Diabetes Mother    Heart Problems Mother    Rheum arthritis Mother    COPD Mother    Polycystic kidney disease Mother    Carpal tunnel syndrome Mother    Cancer Father        Liver Cancer   Liver cancer Father    Carpal tunnel syndrome Sister    Thyroid disease Sister    Rheum arthritis Sister    Lung disease Sister    Diabetes Sister    Carpal tunnel  syndrome Sister    Clotting disorder Sister    Rheum arthritis Sister    Thyroid disease Sister    COPD Sister    Heart Problems Sister    Gout Sister    Cancer Brother    Heart Problems Maternal Grandmother    Cancer Maternal Grandfather        Lung Cancer   Heart Problems Maternal Grandfather    Heart Problems Paternal Grandmother    Cancer  Paternal Grandfather        Brain & Skin cancer   Polycystic ovary syndrome Daughter    Rheum arthritis Maternal Aunt    Fibromyalgia Maternal Aunt    Polycystic kidney disease Maternal Aunt    Heart Problems Maternal Aunt    Anemia Maternal Aunt    Adrenal disorder Maternal Aunt    Carpal tunnel syndrome Maternal Aunt    Diabetes Maternal Aunt    Thyroid disease Maternal Aunt    Rheum arthritis Maternal Aunt    Diabetes Maternal Aunt    Diabetes Maternal Aunt    Rheum arthritis Maternal Aunt    Heart Problems Maternal Aunt    Rheum arthritis Maternal Uncle    Heart Problems Maternal Uncle     Current Medications:  Current Outpatient Medications:    acetaminophen (TYLENOL) 325 MG tablet, Take 2 tablets (650 mg total) by mouth every 6 (six) hours as needed for mild pain (or Fever >/= 101)., Disp: , Rfl:    albuterol (PROVENTIL) (2.5 MG/3ML) 0.083% nebulizer solution, Take 3 mLs (2.5 mg total) by nebulization every 6 (six) hours as needed for wheezing or shortness of breath., Disp: 150 mL, Rfl: 1   feeding supplement (ENSURE ENLIVE / ENSURE PLUS) LIQD, Take 237 mLs by mouth 3 (three) times daily between meals., Disp: , Rfl:    folic acid (FOLVITE) 1 MG tablet, Take 1 tablet (1 mg total) by mouth daily., Disp: 90 tablet, Rfl: 3   furosemide (LASIX) 40 MG tablet, Take 1 tablet (40 mg total) by mouth daily., Disp: 90 tablet, Rfl: 3   guaiFENesin (MUCINEX) 600 MG 12 hr tablet, Take 2 tablets (1,200 mg total) by mouth 2 (two) times daily., Disp: 20 tablet, Rfl: 0   HYDROcodone bit-homatropine (HYCODAN) 5-1.5 MG/5ML syrup, Take 5 mLs by mouth every 6 (six) hours as needed (refractory cough)., Disp: 120 mL, Rfl: 0   iron polysaccharides (NIFEREX) 150 MG capsule, Take 1 capsule (150 mg total) by mouth daily., Disp: 30 capsule, Rfl: 3   megestrol (MEGACE) 40 MG/ML suspension, Take by mouth., Disp: , Rfl:    metoprolol tartrate (LOPRESSOR) 25 MG tablet, Take 0.5 tablets (12.5 mg total) by mouth  2 (two) times daily., Disp: 30 tablet, Rfl: 1   midodrine (PROAMATINE) 10 MG tablet, Take 1 tablet (10 mg total) by mouth 2 (two) times daily with a meal. For soft BP, Disp: 60 tablet, Rfl: 1   Multiple Vitamin (MULTIVITAMIN WITH MINERALS) TABS tablet, Take 1 tablet by mouth daily., Disp: 100 tablet, Rfl: 1   oxyCODONE (OXY IR/ROXICODONE) 5 MG immediate release tablet, Take 1 tablet (5 mg total) by mouth every 4 (four) hours as needed for moderate pain., Disp: 180 tablet, Rfl: 0   Potassium Chloride ER 20 MEQ TBCR, Take 1 tablet (20 mEq total) by mouth daily. 1 tab daily by mouth--- take while taking Lasix/furosemide, Disp: 30 tablet, Rfl: 1   prochlorperazine (COMPAZINE) 10 MG tablet, Take 1 tablet (10 mg total) by mouth every 6 (six) hours  as needed for nausea or vomiting., Disp: 60 tablet, Rfl: 2   saccharomyces boulardii (FLORASTOR) 250 MG capsule, Take 1 capsule (250 mg total) by mouth 2 (two) times daily., Disp: 60 capsule, Rfl: 0   sertraline (ZOLOFT) 100 MG tablet, Take 1 tablet (100 mg total) by mouth daily., Disp: 90 tablet, Rfl: 3   dronabinol (MARINOL) 5 MG capsule, Take 1 capsule (5 mg total) by mouth 2 (two) times daily before a meal., Disp: 60 capsule, Rfl: 0 No current facility-administered medications for this visit.  Facility-Administered Medications Ordered in Other Visits:    0.9 %  sodium chloride infusion, , Intravenous, Continuous, [name], MD, Stopped at 09/01/23 1124   sodium chloride flush (NS) 0.9 % injection 10 mL, 10 mL, Intracatheter, PRN, [name], MD, 10 mL at 09/01/23 1530   Allergies: Allergies  Allergen Reactions   Influenza Vaccines Anaphylaxis and Other (See Comments)    Per patient   Penicillins Anaphylaxis and Other (See Comments)   Articaine Other (See Comments)    Caused infection   Cortisone Other (See Comments)    Turned red and ran a low grade fever for 3 days   Other Rash    bandaids- skin turns red and a rash      REVIEW OF SYSTEMS:   Review of Systems  Constitutional:  Negative for chills, fatigue and fever.  HENT:   Negative for lump/mass, mouth sores, nosebleeds, sore throat and trouble swallowing.   Eyes:  Negative for eye problems.  Respiratory:  Negative for cough and shortness of breath.   Cardiovascular:  Negative for chest pain, leg swelling and palpitations.  Gastrointestinal:  Negative for abdominal pain, constipation, diarrhea, nausea and vomiting.  Genitourinary:  Negative for bladder incontinence, difficulty urinating, dysuria, frequency, hematuria and nocturia.   Musculoskeletal:  Negative for arthralgias, back pain, flank pain, myalgias and neck pain.  Skin:  Negative for itching and rash.  Neurological:  Negative for dizziness, headaches and numbness.  Hematological:  Does not bruise/bleed easily.  Psychiatric/Behavioral:  Positive for sleep disturbance. Negative for depression and suicidal ideas. The patient is not nervous/anxious.   All other systems reviewed and are negative.    VITALS:   There were no vitals taken for this visit.  Wt Readings from Last 3 Encounters:  08/07/23 211 lb 3.2 oz (95.8 kg)  07/20/23 233 lb (105.7 kg)  07/14/23 243 lb 14.4 oz (110.6 kg)    There is no height or weight on file to calculate BMI.  Performance status (ECOG): 3 - Symptomatic, >50% confined to bed  PHYSICAL EXAM:   Physical Exam Vitals and nursing note reviewed. Exam conducted with a chaperone present.  Constitutional:      Appearance: Normal appearance.  Cardiovascular:     Rate and Rhythm: Normal rate and regular rhythm.     Pulses: Normal pulses.     Heart sounds: Normal heart sounds.  Pulmonary:     Effort: Pulmonary effort is normal.     Breath sounds: Normal breath sounds.  Abdominal:     Palpations: Abdomen is soft. There is no hepatomegaly, splenomegaly or mass.     Tenderness: There is no abdominal tenderness.  Musculoskeletal:     Right lower leg: No edema.      Left lower leg: No edema.  Lymphadenopathy:     Cervical: No cervical adenopathy.     Right cervical: No superficial, deep or posterior cervical adenopathy.    Left cervical: No superficial, deep or posterior  cervical adenopathy.     Upper Body:     Right upper body: No supraclavicular or axillary adenopathy.     Left upper body: No supraclavicular or axillary adenopathy.  Neurological:     General: No focal deficit present.     Mental Status: She is alert and oriented to person, place, and time.  Psychiatric:        Mood and Affect: Mood normal.        Behavior: Behavior normal.     LABS:      Latest Ref Rng & Units 09/01/2023    8:35 AM 08/09/2023    8:49 AM 08/07/2023    6:21 AM  CBC  WBC 4.0 - 10.5 K/uL 21.2  20.1  12.7   Hemoglobin 12.0 - 15.0 g/dL 9.6  9.8  8.5   Hematocrit 36.0 - 46.0 % 33.6  32.4  28.8   Platelets 150 - 400 K/uL 1,022  731  607       Latest Ref Rng & Units 09/01/2023    8:35 AM 08/09/2023    8:49 AM 08/07/2023    6:21 AM  CMP  Glucose 70 - 99 mg/dL 474  259  92   BUN 8 - 23 mg/dL 13  15  11    Creatinine 0.44 - 1.00 mg/dL 5.63  8.75  6.43   Sodium 135 - 145 mmol/L 132  131  134   Potassium 3.5 - 5.1 mmol/L 3.5  3.3  3.2   Chloride 98 - 111 mmol/L 93  94  97   CO2 22 - 32 mmol/L 26  26  28    Calcium 8.9 - 10.3 mg/dL 8.6  8.3  8.0   Total Protein 6.5 - 8.1 g/dL 6.7  6.5    Total Bilirubin 0.3 - 1.2 mg/dL 0.7  0.7    Alkaline Phos 38 - 126 U/L 99  79    AST 15 - 41 U/L 56  23    ALT 0 - 44 U/L 62  20       No results found for: "CEA1", "CEA" / No results found for: "CEA1", "CEA" No results found for: "PSA1" No results found for: "PIR518" No results found for: "CAN125"  No results found for: "TOTALPROTELP", "ALBUMINELP", "A1GS", "A2GS", "BETS", "BETA2SER", "GAMS", "MSPIKE", "SPEI" Lab Results  Component Value Date   TIBC 108 (L) 08/04/2023   IRONPCTSAT 15 08/04/2023   No results found for: "LDH"   STUDIES:   US Venous Img Lower  Bilateral (DVT)  Result Date: 08/05/2023 CLINICAL DATA:  Bilateral lower extremity edema EXAM: BILATERAL LOWER EXTREMITY VENOUS DOPPLER ULTRASOUND TECHNIQUE: Gray-scale sonography with graded compression, as well as color Doppler and duplex ultrasound were performed to evaluate the lower extremity deep venous systems from the level of the common femoral vein and including the common femoral, femoral, profunda femoral, popliteal and calf veins including the posterior tibial, peroneal and gastrocnemius veins when visible. The superficial great saphenous vein was also interrogated. Spectral Doppler was utilized to evaluate flow at rest and with distal augmentation maneuvers in the common femoral, femoral and popliteal veins. COMPARISON:  None Available. FINDINGS: RIGHT LOWER EXTREMITY Common Femoral Vein: No evidence of thrombus. Normal compressibility, respiratory phasicity and response to augmentation. Saphenofemoral Junction: No evidence of thrombus. Normal compressibility and flow on color Doppler imaging. Profunda Femoral Vein: No evidence of thrombus. Normal compressibility and flow on color Doppler imaging. Femoral Vein: No evidence of thrombus. Normal compressibility, respiratory phasicity and response to augmentation.  Popliteal Vein: No evidence of thrombus. Normal compressibility, respiratory phasicity and response to augmentation. Calf Veins: No evidence of thrombus. Normal compressibility and flow on color Doppler imaging. Superficial Great Saphenous Vein: No evidence of thrombus. Normal compressibility. Venous Reflux:  None. Other Findings:  None. LEFT LOWER EXTREMITY Common Femoral Vein: No evidence of thrombus. Normal compressibility, respiratory phasicity and response to augmentation. Saphenofemoral Junction: No evidence of thrombus. Normal compressibility and flow on color Doppler imaging. Profunda Femoral Vein: No evidence of thrombus. Normal compressibility and flow on color Doppler imaging.  Femoral Vein: No evidence of thrombus. Normal compressibility, respiratory phasicity and response to augmentation. Popliteal Vein: No evidence of thrombus. Normal compressibility, respiratory phasicity and response to augmentation. Calf Veins: No evidence of thrombus. Normal compressibility and flow on color Doppler imaging. Superficial Great Saphenous Vein: No evidence of thrombus. Normal compressibility. Venous Reflux:  None. Other Findings:  None. IMPRESSION: No evidence of deep venous thrombosis in either lower extremity. Electronically Signed   By: Malachy Moan M.D.   On: 08/05/2023 10:10

## 2023-09-01 NOTE — Progress Notes (Signed)
Ipilimumab (YERVOY) Patient Monitoring Assessment   Is the patient experiencing any of the following general symptoms?:  [X ]Difficulty performing normal activities [ ] Feeling sluggish or cold all the time [ ] Unusual weight gain [ ] Constant or unusual headaches [ ] Feeling dizzy or faint [ ] Changes in eyesight (blurry vision, double vision, or other vision problems) [ ] Changes in mood or behavior (ex: decreased sex drive, irritability, or forgetfulness) [ ] Starting new medications (ex: steroids, other medications that lower immune response) [ ] Patient is not experiencing any of the general symptoms above.   Gastrointestinal  Patient is having 1 bowel movements each day.  Is this different from baseline? [ ] Yes [ ] No Are your stools watery or do they have a foul smell? [ ] Yes [ ] No Have you seen blood in your stools? [ ] Yes [ ] No Are your stools dark, tarry, or sticky? [ ] Yes [ ] No Are you having pain or tenderness in your belly? [ ] Yes [ ] No  Skin Does your skin itch? [ ] Yes [ ] No Do you have a rash? [ ] Yes [ ] No Has your skin blistered and/or peeled? [ ] Yes [ ] No Do you have sores in your mouth? [ ] Yes [ ] No  Hepatic Has your urine been dark or tea colored? [ X]Yes [ ] No Have you noticed that your skin or the whites of your eyes are turning yellow? [ ] Yes [ ] No Are you bleeding or bruising more easily than normal? [ ] Yes [ ] No Are you nauseous and/or vomiting? [ ] Yes [ ] No Do you have pain on the right side of your stomach? [ ] Yes [ ] No  Neurologic  Are you having unusual weakness of legs, arms, or face? [ ] Yes [ ] No Are you having numbness or tingling in your hands or feet? [ ] Yes [ ] No  Miranda Perkins

## 2023-09-01 NOTE — Patient Instructions (Signed)

## 2023-09-02 ENCOUNTER — Other Ambulatory Visit: Payer: Self-pay | Admitting: Oncology

## 2023-09-03 ENCOUNTER — Other Ambulatory Visit: Payer: Self-pay | Admitting: Family Medicine

## 2023-09-03 DIAGNOSIS — R0609 Other forms of dyspnea: Secondary | ICD-10-CM

## 2023-09-03 DIAGNOSIS — R058 Other specified cough: Secondary | ICD-10-CM

## 2023-09-03 NOTE — Addendum Note (Signed)
Addended by: Sonny Masters on: 09/03/2023 11:42 AM   Modules accepted: Orders

## 2023-09-03 NOTE — Telephone Encounter (Signed)
I have put the order in for [facility] and in the services to provide it states assistance with ADLs.

## 2023-09-03 NOTE — Telephone Encounter (Signed)
I think Marcelino Duster will have to order since she saw her. Not sure if her ins will accept my order since I didn't do the face to face.

## 2023-09-06 ENCOUNTER — Other Ambulatory Visit: Payer: Self-pay

## 2023-09-06 MED ORDER — SERTRALINE HCL 100 MG PO TABS: 100.0000 mg | ORAL_TABLET | Freq: Every day | ORAL | 3 refills | Status: AC

## 2023-09-06 MED ORDER — MEGESTROL ACETATE 40 MG/ML PO SUSP: 400.0000 mg | Freq: Two times a day (BID) | ORAL | 2 refills | Status: AC

## 2023-09-08 ENCOUNTER — Telehealth: Payer: Self-pay | Admitting: Family Medicine

## 2023-09-08 NOTE — Telephone Encounter (Signed)
Patient needs a order for pt and ot nurse tia called in requesting the order for the patient.

## 2023-09-09 NOTE — Telephone Encounter (Signed)
LMOVM, referral was placed for Nursing, PT & aide, VO given to add OT, if any further questions to call us back

## 2023-09-10 ENCOUNTER — Ambulatory Visit (HOSPITAL_BASED_OUTPATIENT_CLINIC_OR_DEPARTMENT_OTHER)

## 2023-09-15 ENCOUNTER — Inpatient Hospital Stay: Admitting: Hematology

## 2023-09-15 ENCOUNTER — Inpatient Hospital Stay

## 2023-09-22 ENCOUNTER — Inpatient Hospital Stay

## 2023-09-24 ENCOUNTER — Telehealth: Payer: Self-pay | Admitting: *Deleted

## 2023-09-24 NOTE — Telephone Encounter (Signed)
Copied from CRM [id number]. Topic: General - Deceased Patient >> 2023-10-22  8:22 AM Georgeanna Harrison H wrote: Name of caller: Caleen Jobs  Date of death: 10/21/23  Name of funeral home: [facility]  Phone number of funeral home: [phone]  Provider that needs to sign form: Delynn Flavin   Timeline for signing: Time of Death 6:35pm

## 2023-09-24 NOTE — Telephone Encounter (Signed)
Her death certificate has been completed.

## 2023-09-28 ENCOUNTER — Encounter (HOSPITAL_COMMUNITY): Payer: Self-pay | Admitting: Hematology

## 2023-09-28 ENCOUNTER — Encounter: Payer: Self-pay | Admitting: Hematology

## 2023-10-14 ENCOUNTER — Inpatient Hospital Stay: Admitting: Hematology

## 2023-10-14 ENCOUNTER — Inpatient Hospital Stay

## 2023-11-01 ENCOUNTER — Encounter: Payer: Self-pay | Admitting: Cardiology

## 2023-11-08 ENCOUNTER — Encounter: Admitting: Family Medicine

## 2024-06-07 ENCOUNTER — Other Ambulatory Visit: Payer: Self-pay

## 2024-08-21 ENCOUNTER — Encounter (HOSPITAL_COMMUNITY): Payer: Self-pay | Admitting: Oncology
